# Patient Record
Sex: Male | Born: 1945
Health system: Southern US, Community
[De-identification: ages and names within clinical notes are randomized; demographics above are authoritative.]

## PROBLEM LIST (undated history)

## (undated) DIAGNOSIS — H409 Unspecified glaucoma: Secondary | ICD-10-CM

## (undated) DIAGNOSIS — M1990 Unspecified osteoarthritis, unspecified site: Secondary | ICD-10-CM

## (undated) DIAGNOSIS — M545 Low back pain, unspecified: Secondary | ICD-10-CM

## (undated) DIAGNOSIS — D509 Iron deficiency anemia, unspecified: Secondary | ICD-10-CM

## (undated) DIAGNOSIS — N400 Enlarged prostate without lower urinary tract symptoms: Secondary | ICD-10-CM

## (undated) DIAGNOSIS — D126 Benign neoplasm of colon, unspecified: Secondary | ICD-10-CM

## (undated) DIAGNOSIS — N059 Unspecified nephritic syndrome with unspecified morphologic changes: Secondary | ICD-10-CM

## (undated) DIAGNOSIS — M419 Scoliosis, unspecified: Secondary | ICD-10-CM

## (undated) DIAGNOSIS — K635 Polyp of colon: Secondary | ICD-10-CM

## (undated) DIAGNOSIS — IMO0002 Reserved for concepts with insufficient information to code with codable children: Secondary | ICD-10-CM

## (undated) DIAGNOSIS — K5730 Diverticulosis of large intestine without perforation or abscess without bleeding: Secondary | ICD-10-CM

## (undated) DIAGNOSIS — Z973 Presence of spectacles and contact lenses: Secondary | ICD-10-CM

## (undated) DIAGNOSIS — K2270 Barrett's esophagus without dysplasia: Secondary | ICD-10-CM

## (undated) DIAGNOSIS — K311 Adult hypertrophic pyloric stenosis: Secondary | ICD-10-CM

## (undated) DIAGNOSIS — N200 Calculus of kidney: Secondary | ICD-10-CM

## (undated) DIAGNOSIS — S52501A Unspecified fracture of the lower end of right radius, initial encounter for closed fracture: Secondary | ICD-10-CM

## (undated) DIAGNOSIS — N281 Cyst of kidney, acquired: Secondary | ICD-10-CM

## (undated) DIAGNOSIS — K315 Obstruction of duodenum: Secondary | ICD-10-CM

## (undated) DIAGNOSIS — Z87442 Personal history of urinary calculi: Secondary | ICD-10-CM

## (undated) DIAGNOSIS — I4891 Unspecified atrial fibrillation: Secondary | ICD-10-CM

## (undated) DIAGNOSIS — K219 Gastro-esophageal reflux disease without esophagitis: Secondary | ICD-10-CM

## (undated) DIAGNOSIS — G4730 Sleep apnea, unspecified: Secondary | ICD-10-CM

## (undated) DIAGNOSIS — I4892 Unspecified atrial flutter: Secondary | ICD-10-CM

## (undated) DIAGNOSIS — G8929 Other chronic pain: Secondary | ICD-10-CM

## (undated) DIAGNOSIS — I10 Essential (primary) hypertension: Secondary | ICD-10-CM

## (undated) DIAGNOSIS — E785 Hyperlipidemia, unspecified: Secondary | ICD-10-CM

## (undated) DIAGNOSIS — I498 Other specified cardiac arrhythmias: Secondary | ICD-10-CM

## (undated) HISTORY — DX: Polyp of colon: K63.5

## (undated) HISTORY — DX: Reserved for concepts with insufficient information to code with codable children: IMO0002

## (undated) HISTORY — DX: Low back pain: M54.5

## (undated) HISTORY — DX: Obstruction of duodenum: K31.5

## (undated) HISTORY — PX: SMALL INTESTINE SURGERY: SHX150

## (undated) HISTORY — DX: Unspecified atrial flutter: I48.92

## (undated) HISTORY — DX: Low back pain, unspecified: M54.50

## (undated) HISTORY — DX: Cyst of kidney, acquired: N28.1

## (undated) HISTORY — PX: COLONOSCOPY: SHX174

## (undated) HISTORY — DX: Unspecified osteoarthritis, unspecified site: M19.90

## (undated) HISTORY — DX: Barrett's esophagus without dysplasia: K22.70

## (undated) HISTORY — PX: TONSILLECTOMY: SUR1361

## (undated) HISTORY — DX: Diverticulosis of large intestine without perforation or abscess without bleeding: K57.30

## (undated) HISTORY — DX: Sleep apnea, unspecified: G47.30

## (undated) HISTORY — DX: Iron deficiency anemia, unspecified: D50.9

## (undated) HISTORY — PX: HIATAL HERNIA REPAIR: SHX195

## (undated) HISTORY — DX: Gastro-esophageal reflux disease without esophagitis: K21.9

## (undated) HISTORY — DX: Hyperlipidemia, unspecified: E78.5

## (undated) HISTORY — DX: Calculus of kidney: N20.0

## (undated) HISTORY — DX: Essential (primary) hypertension: I10

## (undated) HISTORY — DX: Adult hypertrophic pyloric stenosis: K31.1

## (undated) HISTORY — PX: APPENDECTOMY: SHX54

## (undated) HISTORY — DX: Other chronic pain: G89.29

## (undated) HISTORY — DX: Other specified cardiac arrhythmias: I49.8

## (undated) HISTORY — DX: Benign prostatic hyperplasia without lower urinary tract symptoms: N40.0

## (undated) HISTORY — DX: Benign neoplasm of colon, unspecified: D12.6

## (undated) HISTORY — DX: Unspecified atrial fibrillation: I48.91

---

## 1999-08-25 ENCOUNTER — Encounter: Payer: Self-pay | Admitting: Internal Medicine

## 1999-08-25 ENCOUNTER — Ambulatory Visit (HOSPITAL_COMMUNITY): Admission: RE | Admit: 1999-08-25 | Discharge: 1999-08-25 | Payer: Self-pay | Admitting: Internal Medicine

## 1999-12-04 ENCOUNTER — Inpatient Hospital Stay (HOSPITAL_COMMUNITY): Admission: EM | Admit: 1999-12-04 | Discharge: 1999-12-07 | Payer: Self-pay | Admitting: *Deleted

## 2000-01-11 DIAGNOSIS — K635 Polyp of colon: Secondary | ICD-10-CM

## 2000-01-11 HISTORY — DX: Polyp of colon: K63.5

## 2000-02-07 ENCOUNTER — Encounter: Payer: Self-pay | Admitting: Gastroenterology

## 2000-02-07 ENCOUNTER — Encounter (INDEPENDENT_AMBULATORY_CARE_PROVIDER_SITE_OTHER): Payer: Self-pay | Admitting: Specialist

## 2000-02-07 ENCOUNTER — Other Ambulatory Visit: Admission: RE | Admit: 2000-02-07 | Discharge: 2000-02-07 | Payer: Self-pay | Admitting: Gastroenterology

## 2000-09-24 ENCOUNTER — Encounter: Payer: Self-pay | Admitting: Endocrinology

## 2000-09-24 ENCOUNTER — Ambulatory Visit (HOSPITAL_COMMUNITY): Admission: RE | Admit: 2000-09-24 | Discharge: 2000-09-24 | Payer: Self-pay | Admitting: Endocrinology

## 2003-01-27 ENCOUNTER — Emergency Department (HOSPITAL_COMMUNITY): Admission: EM | Admit: 2003-01-27 | Discharge: 2003-01-27 | Payer: Self-pay

## 2003-01-29 ENCOUNTER — Emergency Department (HOSPITAL_COMMUNITY): Admission: EM | Admit: 2003-01-29 | Discharge: 2003-01-29 | Payer: Self-pay | Admitting: Emergency Medicine

## 2003-01-29 ENCOUNTER — Encounter: Payer: Self-pay | Admitting: Emergency Medicine

## 2003-02-11 HISTORY — PX: BACK SURGERY: SHX140

## 2003-02-14 ENCOUNTER — Ambulatory Visit (HOSPITAL_COMMUNITY): Admission: RE | Admit: 2003-02-14 | Discharge: 2003-02-14 | Payer: Self-pay | Admitting: Neurosurgery

## 2003-02-14 ENCOUNTER — Encounter: Payer: Self-pay | Admitting: Neurosurgery

## 2003-02-15 ENCOUNTER — Encounter: Payer: Self-pay | Admitting: Neurosurgery

## 2003-02-24 ENCOUNTER — Encounter (INDEPENDENT_AMBULATORY_CARE_PROVIDER_SITE_OTHER): Payer: Self-pay | Admitting: Specialist

## 2003-02-24 ENCOUNTER — Inpatient Hospital Stay (HOSPITAL_COMMUNITY): Admission: RE | Admit: 2003-02-24 | Discharge: 2003-02-26 | Payer: Self-pay | Admitting: Neurosurgery

## 2003-02-24 ENCOUNTER — Encounter: Payer: Self-pay | Admitting: Neurosurgery

## 2004-04-03 ENCOUNTER — Encounter: Admission: RE | Admit: 2004-04-03 | Discharge: 2004-04-03 | Payer: Self-pay | Admitting: Neurosurgery

## 2004-04-18 ENCOUNTER — Encounter: Admission: RE | Admit: 2004-04-18 | Discharge: 2004-04-18 | Payer: Self-pay | Admitting: Neurosurgery

## 2004-05-08 ENCOUNTER — Encounter: Admission: RE | Admit: 2004-05-08 | Discharge: 2004-05-08 | Payer: Self-pay | Admitting: Neurosurgery

## 2004-09-20 ENCOUNTER — Ambulatory Visit: Payer: Self-pay | Admitting: Internal Medicine

## 2004-12-15 ENCOUNTER — Ambulatory Visit: Payer: Self-pay | Admitting: Internal Medicine

## 2005-01-11 ENCOUNTER — Ambulatory Visit: Payer: Self-pay | Admitting: Internal Medicine

## 2005-01-16 ENCOUNTER — Ambulatory Visit (HOSPITAL_COMMUNITY): Admission: RE | Admit: 2005-01-16 | Discharge: 2005-01-16 | Payer: Self-pay | Admitting: Internal Medicine

## 2005-01-23 ENCOUNTER — Ambulatory Visit: Payer: Self-pay | Admitting: Endocrinology

## 2005-01-23 ENCOUNTER — Ambulatory Visit (HOSPITAL_COMMUNITY): Admission: RE | Admit: 2005-01-23 | Discharge: 2005-01-23 | Payer: Self-pay | Admitting: Endocrinology

## 2005-02-12 ENCOUNTER — Ambulatory Visit: Payer: Self-pay | Admitting: Internal Medicine

## 2005-05-14 ENCOUNTER — Ambulatory Visit: Payer: Self-pay | Admitting: Internal Medicine

## 2005-07-06 ENCOUNTER — Ambulatory Visit: Payer: Self-pay | Admitting: Internal Medicine

## 2005-07-23 ENCOUNTER — Encounter: Admission: RE | Admit: 2005-07-23 | Discharge: 2005-07-23 | Payer: Self-pay | Admitting: Internal Medicine

## 2005-07-30 ENCOUNTER — Ambulatory Visit: Payer: Self-pay | Admitting: Internal Medicine

## 2005-08-27 ENCOUNTER — Ambulatory Visit: Payer: Self-pay | Admitting: Internal Medicine

## 2006-01-11 ENCOUNTER — Ambulatory Visit: Payer: Self-pay | Admitting: Internal Medicine

## 2006-01-15 ENCOUNTER — Ambulatory Visit: Payer: Self-pay | Admitting: Internal Medicine

## 2006-03-13 ENCOUNTER — Ambulatory Visit: Payer: Self-pay | Admitting: Internal Medicine

## 2006-04-12 ENCOUNTER — Ambulatory Visit: Payer: Self-pay | Admitting: Internal Medicine

## 2006-04-17 ENCOUNTER — Ambulatory Visit: Payer: Self-pay | Admitting: Internal Medicine

## 2006-05-23 ENCOUNTER — Ambulatory Visit: Payer: Self-pay | Admitting: Internal Medicine

## 2006-07-11 ENCOUNTER — Ambulatory Visit: Payer: Self-pay | Admitting: Internal Medicine

## 2006-07-18 ENCOUNTER — Ambulatory Visit: Payer: Self-pay | Admitting: Internal Medicine

## 2006-09-13 ENCOUNTER — Ambulatory Visit: Payer: Self-pay | Admitting: Internal Medicine

## 2006-09-13 LAB — CONVERTED CEMR LAB
ALT: 18 units/L (ref 0–40)
AST: 21 units/L (ref 0–37)
Albumin: 4 g/dL (ref 3.5–5.2)
Alkaline Phosphatase: 84 units/L (ref 39–117)
BUN: 12 mg/dL (ref 6–23)
Basophils Absolute: 0 10*3/uL (ref 0.0–0.1)
Basophils Relative: 0.6 % (ref 0.0–1.0)
CO2: 23 meq/L (ref 19–32)
Calcium: 10.2 mg/dL (ref 8.4–10.5)
Chloride: 108 meq/L (ref 96–112)
Chol/HDL Ratio, serum: 4.4
Cholesterol: 178 mg/dL (ref 0–200)
Creatinine, Ser: 1.3 mg/dL (ref 0.4–1.5)
Eosinophil percent: 2.6 % (ref 0.0–5.0)
GFR calc non Af Amer: 60 mL/min
Glomerular Filtration Rate, Af Am: 72 mL/min/{1.73_m2}
Glucose, Bld: 107 mg/dL — ABNORMAL HIGH (ref 70–99)
HCT: 33.5 % — ABNORMAL LOW (ref 39.0–52.0)
HDL: 40.4 mg/dL (ref >39.0)
Hemoglobin: 10.7 g/dL — ABNORMAL LOW (ref 13.0–17.0)
LDL DIRECT: 99.6 mg/dL
Lymphocytes Relative: 24.1 % (ref 12.0–46.0)
MCHC: 32 g/dL (ref 30.0–36.0)
MCV: 90.7 fL (ref 78.0–100.0)
Monocytes Absolute: 0.5 10*3/uL (ref 0.2–0.7)
Monocytes Relative: 7.4 % (ref 3.0–11.0)
Neutro Abs: 4.2 10*3/uL (ref 1.4–7.7)
Neutrophils Relative %: 65.3 % (ref 43.0–77.0)
Platelets: 669 10*3/uL — ABNORMAL HIGH (ref 150–400)
Potassium: 4.5 meq/L (ref 3.5–5.1)
RBC: 3.69 M/uL — ABNORMAL LOW (ref 4.22–5.81)
RDW: 13.5 % (ref 11.5–14.6)
Sodium: 141 meq/L (ref 135–145)
TSH: 0.58 microintl units/mL (ref 0.35–5.50)
Total Bilirubin: 0.5 mg/dL (ref 0.3–1.2)
Total Protein: 7.1 g/dL (ref 6.0–8.3)
Triglyceride fasting, serum: 231 mg/dL (ref 0–149)
VLDL: 46 mg/dL — ABNORMAL HIGH (ref 0–40)
Vitamin B-12: 751 pg/mL (ref 211–911)
WBC: 6.4 10*3/uL (ref 4.5–10.5)

## 2006-10-10 ENCOUNTER — Ambulatory Visit: Payer: Self-pay | Admitting: Internal Medicine

## 2006-12-20 ENCOUNTER — Ambulatory Visit: Payer: Self-pay | Admitting: Internal Medicine

## 2006-12-20 LAB — CONVERTED CEMR LAB
BUN: 11 mg/dL (ref 6–23)
Basophils Absolute: 0.1 10*3/uL (ref 0.0–0.1)
Basophils Relative: 0.7 % (ref 0.0–1.0)
CO2: 24 meq/L (ref 19–32)
Calcium: 10.3 mg/dL (ref 8.4–10.5)
Chloride: 107 meq/L (ref 96–112)
Creatinine, Ser: 1.3 mg/dL (ref 0.4–1.5)
Eosinophils Absolute: 0.3 10*3/uL (ref 0.0–0.6)
Eosinophils Relative: 3.7 % (ref 0.0–5.0)
GFR calc Af Amer: 72 mL/min
GFR calc non Af Amer: 60 mL/min
Glucose, Bld: 108 mg/dL — ABNORMAL HIGH (ref 70–99)
HCT: 32.3 % — ABNORMAL LOW (ref 39.0–52.0)
Hemoglobin: 10.5 g/dL — ABNORMAL LOW (ref 13.0–17.0)
Hgb A1c MFr Bld: 5.8 % (ref 4.6–6.0)
Iron: 24 ug/dL — ABNORMAL LOW (ref 42–165)
Lymphocytes Relative: 14.2 % (ref 12.0–46.0)
MCHC: 32.6 g/dL (ref 30.0–36.0)
MCV: 80 fL (ref 78.0–100.0)
Monocytes Absolute: 0.5 10*3/uL (ref 0.2–0.7)
Monocytes Relative: 5.9 % (ref 3.0–11.0)
Neutro Abs: 5.8 10*3/uL (ref 1.4–7.7)
Neutrophils Relative %: 75.5 % (ref 43.0–77.0)
Platelets: 547 10*3/uL — ABNORMAL HIGH (ref 150–400)
Potassium: 5 meq/L (ref 3.5–5.1)
RBC: 4.04 M/uL — ABNORMAL LOW (ref 4.22–5.81)
RDW: 17.3 % — ABNORMAL HIGH (ref 11.5–14.6)
Saturation Ratios: 5.8 % — ABNORMAL LOW (ref 20.0–50.0)
Sodium: 146 meq/L — ABNORMAL HIGH (ref 135–145)
Testosterone: 526.02 ng/dL (ref 350.00–890)
Transferrin: 296.8 mg/dL (ref >=212.0)
WBC: 7.8 10*3/uL (ref 4.5–10.5)

## 2007-02-27 ENCOUNTER — Ambulatory Visit: Payer: Self-pay | Admitting: Gastroenterology

## 2007-03-04 ENCOUNTER — Ambulatory Visit: Payer: Self-pay | Admitting: Internal Medicine

## 2007-03-13 ENCOUNTER — Ambulatory Visit: Payer: Self-pay | Admitting: Gastroenterology

## 2007-03-13 LAB — HM COLONOSCOPY

## 2007-03-24 ENCOUNTER — Ambulatory Visit: Payer: Self-pay | Admitting: Internal Medicine

## 2007-03-24 LAB — CONVERTED CEMR LAB
ALT: 16 units/L (ref 0–40)
AST: 17 units/L (ref 0–37)
Albumin: 3.6 g/dL (ref 3.5–5.2)
Alkaline Phosphatase: 75 units/L (ref 39–117)
Amylase: 63 units/L (ref 27–131)
BUN: 10 mg/dL (ref 6–23)
Basophils Absolute: 0 10*3/uL (ref 0.0–0.1)
Basophils Relative: 0 % (ref 0.0–1.0)
Bilirubin Urine: NEGATIVE
Bilirubin, Direct: 0.1 mg/dL (ref 0.0–0.3)
CO2: 27 meq/L (ref 19–32)
Calcium: 9.8 mg/dL (ref 8.4–10.5)
Chloride: 112 meq/L (ref 96–112)
Cholesterol: 122 mg/dL (ref 0–200)
Creatinine, Ser: 1.2 mg/dL (ref 0.4–1.5)
Eosinophils Absolute: 0.1 10*3/uL (ref 0.0–0.6)
Eosinophils Relative: 0.9 % (ref 0.0–5.0)
GFR calc Af Amer: 79 mL/min
GFR calc non Af Amer: 65 mL/min
Glucose, Bld: 107 mg/dL — ABNORMAL HIGH (ref 70–99)
HCT: 33.2 % — ABNORMAL LOW (ref 39.0–52.0)
HDL: 33.1 mg/dL — ABNORMAL LOW (ref >39.0)
Hemoglobin, Urine: NEGATIVE
Hemoglobin: 10.8 g/dL — ABNORMAL LOW (ref 13.0–17.0)
Ketones, ur: NEGATIVE mg/dL
LDL Cholesterol: 59 mg/dL (ref 0–99)
Leukocytes, UA: NEGATIVE
Lipase: 18 units/L (ref 11.0–59.0)
Lymphocytes Relative: 8.6 % — ABNORMAL LOW (ref 12.0–46.0)
MCHC: 32.6 g/dL (ref 30.0–36.0)
MCV: 83.1 fL (ref 78.0–100.0)
Monocytes Absolute: 0.7 10*3/uL (ref 0.2–0.7)
Monocytes Relative: 4.8 % (ref 3.0–11.0)
Neutro Abs: 11.8 10*3/uL — ABNORMAL HIGH (ref 1.4–7.7)
Neutrophils Relative %: 85.7 % — ABNORMAL HIGH (ref 43.0–77.0)
Nitrite: NEGATIVE
PSA: 0.32 ng/mL (ref 0.10–4.00)
Platelets: 543 10*3/uL — ABNORMAL HIGH (ref 150–400)
Potassium: 4.3 meq/L (ref 3.5–5.1)
RBC: 4 M/uL — ABNORMAL LOW (ref 4.22–5.81)
RDW: 17.5 % — ABNORMAL HIGH (ref 11.5–14.6)
Sodium: 145 meq/L (ref 135–145)
Specific Gravity, Urine: 1.01 (ref 1.000–1.03)
Total Bilirubin: 0.5 mg/dL (ref 0.3–1.2)
Total CHOL/HDL Ratio: 3.7
Total Protein, Urine: NEGATIVE mg/dL
Total Protein: 6.5 g/dL (ref 6.0–8.3)
Triglycerides: 151 mg/dL — ABNORMAL HIGH (ref 0–149)
Urine Glucose: NEGATIVE mg/dL
Urobilinogen, UA: 0.2 (ref 0.0–1.0)
VLDL: 30 mg/dL (ref 0–40)
Vit D, 1,25-Dihydroxy: 41 (ref 20–57)
WBC: 13.8 10*3/uL — ABNORMAL HIGH (ref 4.5–10.5)
pH: 6.5 (ref 5.0–8.0)

## 2007-04-08 ENCOUNTER — Ambulatory Visit: Payer: Self-pay | Admitting: Internal Medicine

## 2007-05-22 ENCOUNTER — Ambulatory Visit (HOSPITAL_BASED_OUTPATIENT_CLINIC_OR_DEPARTMENT_OTHER): Admission: RE | Admit: 2007-05-22 | Discharge: 2007-05-22 | Payer: Self-pay | Admitting: Urology

## 2007-06-09 ENCOUNTER — Ambulatory Visit: Payer: Self-pay | Admitting: Internal Medicine

## 2007-06-09 DIAGNOSIS — M519 Unspecified thoracic, thoracolumbar and lumbosacral intervertebral disc disorder: Secondary | ICD-10-CM | POA: Insufficient documentation

## 2007-06-09 DIAGNOSIS — E785 Hyperlipidemia, unspecified: Secondary | ICD-10-CM | POA: Insufficient documentation

## 2007-06-09 DIAGNOSIS — I119 Hypertensive heart disease without heart failure: Secondary | ICD-10-CM | POA: Insufficient documentation

## 2007-06-09 DIAGNOSIS — D509 Iron deficiency anemia, unspecified: Secondary | ICD-10-CM | POA: Insufficient documentation

## 2007-06-09 DIAGNOSIS — K5730 Diverticulosis of large intestine without perforation or abscess without bleeding: Secondary | ICD-10-CM | POA: Insufficient documentation

## 2007-06-09 LAB — CONVERTED CEMR LAB
Amphetamine Screen, Ur: NEGATIVE
BUN: 9 mg/dL (ref 6–23)
Barbiturate Quant, Ur: NEGATIVE
Benzodiazepines.: POSITIVE — AB
CO2: 27 meq/L (ref 19–32)
Calcium: 9.6 mg/dL (ref 8.4–10.5)
Chloride: 108 meq/L (ref 96–112)
Cholesterol: 214 mg/dL (ref 0–200)
Cocaine Metabolites: NEGATIVE
Creatinine, Ser: 1.2 mg/dL (ref 0.4–1.5)
Creatinine,U: 25 mg/dL
Direct LDL: 134.8 mg/dL
GFR calc Af Amer: 79 mL/min
GFR calc non Af Amer: 65 mL/min
Glucose, Bld: 104 mg/dL — ABNORMAL HIGH (ref 70–99)
HDL: 35.6 mg/dL — ABNORMAL LOW (ref >39.0)
Iron: 41 ug/dL — ABNORMAL LOW (ref 42–165)
Marijuana Metabolite: NEGATIVE
Methadone: NEGATIVE
Opiate Screen, Urine: NEGATIVE
PSA: 0.35 ng/mL (ref 0.10–4.00)
Phencyclidine (PCP): NEGATIVE
Potassium: 5.1 meq/L (ref 3.5–5.1)
Propoxyphene: NEGATIVE
Sodium: 143 meq/L (ref 135–145)
Total CHOL/HDL Ratio: 6
Triglycerides: 188 mg/dL — ABNORMAL HIGH (ref 0–149)
VLDL: 38 mg/dL (ref 0–40)

## 2007-06-10 ENCOUNTER — Ambulatory Visit: Payer: Self-pay | Admitting: Internal Medicine

## 2007-07-10 ENCOUNTER — Ambulatory Visit: Payer: Self-pay | Admitting: Internal Medicine

## 2007-07-11 ENCOUNTER — Ambulatory Visit (HOSPITAL_COMMUNITY): Admission: RE | Admit: 2007-07-11 | Discharge: 2007-07-11 | Payer: Self-pay | Admitting: Internal Medicine

## 2007-09-04 ENCOUNTER — Ambulatory Visit: Payer: Self-pay | Admitting: Internal Medicine

## 2007-09-04 LAB — CONVERTED CEMR LAB
ALT: 15 units/L (ref 0–53)
AST: 18 units/L (ref 0–37)
Albumin: 3.9 g/dL (ref 3.5–5.2)
Alkaline Phosphatase: 82 units/L (ref 39–117)
Amylase: 82 units/L (ref 27–131)
BUN: 13 mg/dL (ref 6–23)
Basophils Absolute: 0 10*3/uL (ref 0.0–0.1)
Basophils Relative: 0.3 % (ref 0.0–1.0)
Bilirubin Urine: NEGATIVE
Bilirubin, Direct: 0.1 mg/dL (ref 0.0–0.3)
CO2: 25 meq/L (ref 19–32)
Calcium: 9.6 mg/dL (ref 8.4–10.5)
Chloride: 104 meq/L (ref 96–112)
Creatinine, Ser: 1.1 mg/dL (ref 0.4–1.5)
Eosinophils Absolute: 0.4 10*3/uL (ref 0.0–0.6)
Eosinophils Relative: 5.6 % — ABNORMAL HIGH (ref 0.0–5.0)
GFR calc Af Amer: 88 mL/min
GFR calc non Af Amer: 72 mL/min
Glucose, Bld: 104 mg/dL — ABNORMAL HIGH (ref 70–99)
HCT: 39.6 % (ref 39.0–52.0)
Hemoglobin, Urine: NEGATIVE
Hemoglobin: 13.6 g/dL (ref 13.0–17.0)
Ketones, ur: NEGATIVE mg/dL
Leukocytes, UA: NEGATIVE
Lipase: 23 units/L (ref 11.0–59.0)
Lymphocytes Relative: 23.6 % (ref 12.0–46.0)
MCHC: 34.4 g/dL (ref 30.0–36.0)
MCV: 84.9 fL (ref 78.0–100.0)
Monocytes Absolute: 0.3 10*3/uL (ref 0.2–0.7)
Monocytes Relative: 4.5 % (ref 3.0–11.0)
Neutro Abs: 4.2 10*3/uL (ref 1.4–7.7)
Neutrophils Relative %: 66 % (ref 43.0–77.0)
Nitrite: NEGATIVE
Platelets: 381 10*3/uL (ref 150–400)
Potassium: 4.7 meq/L (ref 3.5–5.1)
RBC: 4.67 M/uL (ref 4.22–5.81)
RDW: 18 % — ABNORMAL HIGH (ref 11.5–14.6)
Sed Rate: 10 mm/hr (ref 0–20)
Sodium: 137 meq/L (ref 135–145)
Specific Gravity, Urine: 1.01 (ref 1.000–1.03)
Total Bilirubin: 0.6 mg/dL (ref 0.3–1.2)
Total Protein, Urine: NEGATIVE mg/dL
Total Protein: 6.5 g/dL (ref 6.0–8.3)
Urine Glucose: NEGATIVE mg/dL
Urobilinogen, UA: 0.2 (ref 0.0–1.0)
WBC: 6.4 10*3/uL (ref 4.5–10.5)
pH: 6.5 (ref 5.0–8.0)

## 2007-09-11 ENCOUNTER — Encounter: Payer: Self-pay | Admitting: Internal Medicine

## 2007-09-11 ENCOUNTER — Ambulatory Visit: Payer: Self-pay | Admitting: Internal Medicine

## 2007-09-11 DIAGNOSIS — K219 Gastro-esophageal reflux disease without esophagitis: Secondary | ICD-10-CM | POA: Insufficient documentation

## 2007-09-11 DIAGNOSIS — N400 Enlarged prostate without lower urinary tract symptoms: Secondary | ICD-10-CM | POA: Insufficient documentation

## 2007-09-18 ENCOUNTER — Telehealth: Payer: Self-pay | Admitting: Internal Medicine

## 2007-09-29 ENCOUNTER — Telehealth: Payer: Self-pay | Admitting: Internal Medicine

## 2007-10-08 ENCOUNTER — Telehealth: Payer: Self-pay | Admitting: Internal Medicine

## 2007-10-15 ENCOUNTER — Ambulatory Visit: Payer: Self-pay | Admitting: Internal Medicine

## 2007-10-15 LAB — CONVERTED CEMR LAB
ALT: 21 units/L (ref 0–53)
AST: 19 units/L (ref 0–37)
Albumin: 3.9 g/dL (ref 3.5–5.2)
Alkaline Phosphatase: 85 units/L (ref 39–117)
Basophils Absolute: 0 10*3/uL (ref 0.0–0.1)
Basophils Relative: 0.1 % (ref 0.0–1.0)
Bilirubin Urine: NEGATIVE
Bilirubin, Direct: 0.1 mg/dL (ref 0.0–0.3)
Cholesterol: 201 mg/dL (ref 0–200)
Direct LDL: 123.1 mg/dL
Eosinophils Absolute: 0.2 10*3/uL (ref 0.0–0.6)
Eosinophils Relative: 4.6 % (ref 0.0–5.0)
HCT: 41.4 % (ref 39.0–52.0)
HDL: 45.8 mg/dL (ref >39.0)
Hemoglobin, Urine: NEGATIVE
Hemoglobin: 14.3 g/dL (ref 13.0–17.0)
Ketones, ur: NEGATIVE mg/dL
Leukocytes, UA: NEGATIVE
Lymphocytes Relative: 26.2 % (ref 12.0–46.0)
MCHC: 34.4 g/dL (ref 30.0–36.0)
MCV: 89.7 fL (ref 78.0–100.0)
Monocytes Absolute: 0.4 10*3/uL (ref 0.2–0.7)
Monocytes Relative: 6.9 % (ref 3.0–11.0)
Neutro Abs: 3.3 10*3/uL (ref 1.4–7.7)
Neutrophils Relative %: 62.2 % (ref 43.0–77.0)
Nitrite: NEGATIVE
Platelets: 356 10*3/uL (ref 150–400)
RBC: 4.61 M/uL (ref 4.22–5.81)
RDW: 14.6 % (ref 11.5–14.6)
Specific Gravity, Urine: 1.01 (ref 1.000–1.03)
Total Bilirubin: 0.7 mg/dL (ref 0.3–1.2)
Total CHOL/HDL Ratio: 4.4
Total Protein, Urine: NEGATIVE mg/dL
Total Protein: 6.9 g/dL (ref 6.0–8.3)
Triglycerides: 174 mg/dL — ABNORMAL HIGH (ref 0–149)
Urine Glucose: NEGATIVE mg/dL
Urobilinogen, UA: 0.2 (ref 0.0–1.0)
VLDL: 35 mg/dL (ref 0–40)
WBC: 5.3 10*3/uL (ref 4.5–10.5)
pH: 7 (ref 5.0–8.0)

## 2007-10-22 ENCOUNTER — Ambulatory Visit: Payer: Self-pay | Admitting: Internal Medicine

## 2007-10-22 DIAGNOSIS — J309 Allergic rhinitis, unspecified: Secondary | ICD-10-CM | POA: Insufficient documentation

## 2007-10-27 ENCOUNTER — Telehealth: Payer: Self-pay | Admitting: Internal Medicine

## 2007-11-13 HISTORY — PX: LUMBAR FUSION: SHX111

## 2007-12-24 ENCOUNTER — Telehealth: Payer: Self-pay | Admitting: Internal Medicine

## 2008-01-02 ENCOUNTER — Telehealth: Payer: Self-pay | Admitting: Internal Medicine

## 2008-01-16 ENCOUNTER — Ambulatory Visit: Payer: Self-pay | Admitting: Internal Medicine

## 2008-01-16 LAB — CONVERTED CEMR LAB
ALT: 23 units/L (ref 0–53)
AST: 21 units/L (ref 0–37)
Albumin: 3.7 g/dL (ref 3.5–5.2)
Alkaline Phosphatase: 78 units/L (ref 39–117)
BUN: 17 mg/dL (ref 6–23)
Bilirubin, Direct: 0.1 mg/dL (ref 0.0–0.3)
CO2: 31 meq/L (ref 19–32)
Calcium: 9.9 mg/dL (ref 8.4–10.5)
Chloride: 107 meq/L (ref 96–112)
Cholesterol: 165 mg/dL (ref 0–200)
Creatinine, Ser: 1.2 mg/dL (ref 0.4–1.5)
GFR calc Af Amer: 79 mL/min
GFR calc non Af Amer: 65 mL/min
Glucose, Bld: 94 mg/dL (ref 70–99)
HDL: 39.8 mg/dL (ref >39.0)
LDL Cholesterol: 91 mg/dL (ref 0–99)
Potassium: 5.2 meq/L — ABNORMAL HIGH (ref 3.5–5.1)
Sodium: 142 meq/L (ref 135–145)
Total Bilirubin: 0.7 mg/dL (ref 0.3–1.2)
Total CHOL/HDL Ratio: 4.1
Total Protein: 6.2 g/dL (ref 6.0–8.3)
Triglycerides: 173 mg/dL — ABNORMAL HIGH (ref 0–149)
VLDL: 35 mg/dL (ref 0–40)

## 2008-01-22 ENCOUNTER — Ambulatory Visit: Payer: Self-pay | Admitting: Internal Medicine

## 2008-02-17 ENCOUNTER — Ambulatory Visit: Payer: Self-pay | Admitting: Internal Medicine

## 2008-03-22 ENCOUNTER — Telehealth: Payer: Self-pay | Admitting: Internal Medicine

## 2008-04-15 ENCOUNTER — Telehealth (INDEPENDENT_AMBULATORY_CARE_PROVIDER_SITE_OTHER): Payer: Self-pay | Admitting: *Deleted

## 2008-04-19 ENCOUNTER — Ambulatory Visit (HOSPITAL_COMMUNITY): Admission: RE | Admit: 2008-04-19 | Discharge: 2008-04-19 | Payer: Self-pay | Admitting: Neurosurgery

## 2008-04-20 ENCOUNTER — Telehealth: Payer: Self-pay | Admitting: Internal Medicine

## 2008-04-30 ENCOUNTER — Telehealth: Payer: Self-pay | Admitting: Internal Medicine

## 2008-04-30 ENCOUNTER — Ambulatory Visit: Payer: Self-pay | Admitting: Internal Medicine

## 2008-05-04 LAB — CONVERTED CEMR LAB
BUN: 14 mg/dL (ref 6–23)
Basophils Absolute: 0.1 10*3/uL (ref 0.0–0.1)
Basophils Relative: 0.8 % (ref 0.0–1.0)
Bilirubin Urine: NEGATIVE
CO2: 28 meq/L (ref 19–32)
Calcium: 9.5 mg/dL (ref 8.4–10.5)
Chloride: 108 meq/L (ref 96–112)
Creatinine, Ser: 1.1 mg/dL (ref 0.4–1.5)
Crystals: NEGATIVE
Eosinophils Absolute: 0.1 10*3/uL (ref 0.0–0.7)
Eosinophils Relative: 1.4 % (ref 0.0–5.0)
GFR calc Af Amer: 87 mL/min
GFR calc non Af Amer: 72 mL/min
Glucose, Bld: 111 mg/dL — ABNORMAL HIGH (ref 70–99)
HCT: 41.4 % (ref 39.0–52.0)
Hemoglobin: 14.3 g/dL (ref 13.0–17.0)
Hgb A1c MFr Bld: 5.5 % (ref 4.6–6.0)
Ketones, ur: NEGATIVE mg/dL
Lymphocytes Relative: 19.1 % (ref 12.0–46.0)
MCHC: 34.5 g/dL (ref 30.0–36.0)
MCV: 91.6 fL (ref 78.0–100.0)
Monocytes Absolute: 0.5 10*3/uL (ref 0.1–1.0)
Monocytes Relative: 7.9 % (ref 3.0–12.0)
Mucus, UA: NEGATIVE
Neutro Abs: 5.1 10*3/uL (ref 1.4–7.7)
Neutrophils Relative %: 70.8 % (ref 43.0–77.0)
Nitrite: NEGATIVE
Platelets: 304 10*3/uL (ref 150–400)
Potassium: 3.9 meq/L (ref 3.5–5.1)
RBC: 4.52 M/uL (ref 4.22–5.81)
RDW: 12.1 % (ref 11.5–14.6)
Sodium: 143 meq/L (ref 135–145)
Specific Gravity, Urine: 1.01 (ref 1.000–1.03)
TSH: 4.33 microintl units/mL (ref 0.35–5.50)
Total Protein, Urine: NEGATIVE mg/dL
Urine Glucose: NEGATIVE mg/dL
Urobilinogen, UA: 0.2 (ref 0.0–1.0)
WBC: 7.3 10*3/uL (ref 4.5–10.5)
pH: 6 (ref 5.0–8.0)

## 2008-05-05 ENCOUNTER — Ambulatory Visit: Payer: Self-pay | Admitting: Internal Medicine

## 2008-05-05 DIAGNOSIS — N390 Urinary tract infection, site not specified: Secondary | ICD-10-CM | POA: Insufficient documentation

## 2008-05-07 ENCOUNTER — Inpatient Hospital Stay (HOSPITAL_COMMUNITY): Admission: RE | Admit: 2008-05-07 | Discharge: 2008-05-18 | Payer: Self-pay | Admitting: Neurosurgery

## 2008-05-07 ENCOUNTER — Ambulatory Visit: Payer: Self-pay | Admitting: Pulmonary Disease

## 2008-05-07 ENCOUNTER — Ambulatory Visit: Payer: Self-pay | Admitting: Cardiology

## 2008-05-07 ENCOUNTER — Ambulatory Visit: Payer: Self-pay | Admitting: Internal Medicine

## 2008-05-11 ENCOUNTER — Ambulatory Visit: Payer: Self-pay | Admitting: Internal Medicine

## 2008-05-13 ENCOUNTER — Encounter (INDEPENDENT_AMBULATORY_CARE_PROVIDER_SITE_OTHER): Payer: Self-pay | Admitting: Neurosurgery

## 2008-05-17 ENCOUNTER — Encounter: Payer: Self-pay | Admitting: Internal Medicine

## 2008-06-16 ENCOUNTER — Ambulatory Visit: Payer: Self-pay | Admitting: Cardiology

## 2008-06-21 ENCOUNTER — Telehealth: Payer: Self-pay | Admitting: Internal Medicine

## 2008-06-22 ENCOUNTER — Ambulatory Visit: Payer: Self-pay | Admitting: Internal Medicine

## 2008-06-22 LAB — CONVERTED CEMR LAB
Bilirubin Urine: NEGATIVE
Crystals: NEGATIVE
Hemoglobin, Urine: NEGATIVE
Ketones, ur: NEGATIVE mg/dL
Mucus, UA: NEGATIVE
Nitrite: POSITIVE — AB
Specific Gravity, Urine: 1.01 (ref 1.000–1.03)
Total Protein, Urine: NEGATIVE mg/dL
Urine Glucose: NEGATIVE mg/dL
Urobilinogen, UA: 0.2 (ref 0.0–1.0)
pH: 6 (ref 5.0–8.0)

## 2008-07-01 ENCOUNTER — Telehealth: Payer: Self-pay | Admitting: Internal Medicine

## 2008-07-02 ENCOUNTER — Ambulatory Visit: Payer: Self-pay | Admitting: Internal Medicine

## 2008-07-02 LAB — CONVERTED CEMR LAB
Bilirubin Urine: NEGATIVE
Hemoglobin, Urine: NEGATIVE
Leukocytes, UA: NEGATIVE
Nitrite: NEGATIVE
Specific Gravity, Urine: 1.015 (ref 1.000–1.03)
Total Protein, Urine: NEGATIVE mg/dL
Urine Glucose: NEGATIVE mg/dL
Urobilinogen, UA: 0.2 (ref 0.0–1.0)
pH: 6 (ref 5.0–8.0)

## 2008-07-21 ENCOUNTER — Telehealth: Payer: Self-pay | Admitting: Internal Medicine

## 2008-08-02 ENCOUNTER — Ambulatory Visit: Payer: Self-pay | Admitting: Internal Medicine

## 2008-08-02 LAB — CONVERTED CEMR LAB
ALT: 18 units/L (ref 0–53)
AST: 20 units/L (ref 0–37)
Albumin: 3.7 g/dL (ref 3.5–5.2)
Alkaline Phosphatase: 102 units/L (ref 39–117)
BUN: 14 mg/dL (ref 6–23)
Basophils Absolute: 0.1 10*3/uL (ref 0.0–0.1)
Basophils Relative: 0.9 % (ref 0.0–3.0)
Bilirubin, Direct: 0.1 mg/dL (ref 0.0–0.3)
CO2: 28 meq/L (ref 19–32)
Calcium: 9.6 mg/dL (ref 8.4–10.5)
Chloride: 115 meq/L — ABNORMAL HIGH (ref 96–112)
Cholesterol: 127 mg/dL (ref 0–200)
Creatinine, Ser: 1.1 mg/dL (ref 0.4–1.5)
Eosinophils Absolute: 0.4 10*3/uL (ref 0.0–0.7)
Eosinophils Relative: 5.8 % — ABNORMAL HIGH (ref 0.0–5.0)
GFR calc Af Amer: 87 mL/min
GFR calc non Af Amer: 72 mL/min
Glucose, Bld: 104 mg/dL — ABNORMAL HIGH (ref 70–99)
HCT: 36.4 % — ABNORMAL LOW (ref 39.0–52.0)
HDL: 37.8 mg/dL — ABNORMAL LOW (ref >39.0)
Hemoglobin: 12.2 g/dL — ABNORMAL LOW (ref 13.0–17.0)
Iron: 31 ug/dL — ABNORMAL LOW (ref 42–165)
LDL Cholesterol: 59 mg/dL (ref 0–99)
Lymphocytes Relative: 25.5 % (ref 12.0–46.0)
MCHC: 33.5 g/dL (ref 30.0–36.0)
MCV: 91.6 fL (ref 78.0–100.0)
Monocytes Absolute: 0.5 10*3/uL (ref 0.1–1.0)
Monocytes Relative: 7.9 % (ref 3.0–12.0)
Neutro Abs: 3.5 10*3/uL (ref 1.4–7.7)
Neutrophils Relative %: 59.9 % (ref 43.0–77.0)
Platelets: 369 10*3/uL (ref 150–400)
Potassium: 4.5 meq/L (ref 3.5–5.1)
RBC: 3.97 M/uL — ABNORMAL LOW (ref 4.22–5.81)
RDW: 14.2 % (ref 11.5–14.6)
Saturation Ratios: 8.8 % — ABNORMAL LOW (ref 20.0–50.0)
Sodium: 144 meq/L (ref 135–145)
Total Bilirubin: 0.5 mg/dL (ref 0.3–1.2)
Total CHOL/HDL Ratio: 3.4
Total Protein: 6.5 g/dL (ref 6.0–8.3)
Transferrin: 251.3 mg/dL (ref >=212.0)
Triglycerides: 151 mg/dL — ABNORMAL HIGH (ref 0–149)
VLDL: 30 mg/dL (ref 0–40)
WBC: 6.1 10*3/uL (ref 4.5–10.5)

## 2008-08-05 ENCOUNTER — Ambulatory Visit: Payer: Self-pay | Admitting: Internal Medicine

## 2008-08-12 ENCOUNTER — Ambulatory Visit: Payer: Self-pay | Admitting: Internal Medicine

## 2008-08-12 ENCOUNTER — Ambulatory Visit: Payer: Self-pay | Admitting: Gastroenterology

## 2008-08-12 ENCOUNTER — Inpatient Hospital Stay (HOSPITAL_COMMUNITY): Admission: AD | Admit: 2008-08-12 | Discharge: 2008-08-20 | Payer: Self-pay | Admitting: Internal Medicine

## 2008-08-12 DIAGNOSIS — Z8719 Personal history of other diseases of the digestive system: Secondary | ICD-10-CM | POA: Insufficient documentation

## 2008-08-12 DIAGNOSIS — K5289 Other specified noninfective gastroenteritis and colitis: Secondary | ICD-10-CM | POA: Insufficient documentation

## 2008-08-12 DIAGNOSIS — K922 Gastrointestinal hemorrhage, unspecified: Secondary | ICD-10-CM | POA: Insufficient documentation

## 2008-08-13 ENCOUNTER — Encounter: Payer: Self-pay | Admitting: Gastroenterology

## 2008-08-13 ENCOUNTER — Encounter (INDEPENDENT_AMBULATORY_CARE_PROVIDER_SITE_OTHER): Payer: Self-pay | Admitting: *Deleted

## 2008-08-16 ENCOUNTER — Ambulatory Visit: Payer: Self-pay | Admitting: Gastroenterology

## 2008-08-20 ENCOUNTER — Encounter (INDEPENDENT_AMBULATORY_CARE_PROVIDER_SITE_OTHER): Payer: Self-pay | Admitting: *Deleted

## 2008-08-26 ENCOUNTER — Ambulatory Visit: Payer: Self-pay | Admitting: Internal Medicine

## 2008-08-26 DIAGNOSIS — R5383 Other fatigue: Secondary | ICD-10-CM | POA: Insufficient documentation

## 2008-08-30 ENCOUNTER — Ambulatory Visit: Payer: Self-pay | Admitting: Gastroenterology

## 2008-08-30 DIAGNOSIS — K250 Acute gastric ulcer with hemorrhage: Secondary | ICD-10-CM | POA: Insufficient documentation

## 2008-08-30 DIAGNOSIS — D62 Acute posthemorrhagic anemia: Secondary | ICD-10-CM | POA: Insufficient documentation

## 2008-08-30 DIAGNOSIS — K259 Gastric ulcer, unspecified as acute or chronic, without hemorrhage or perforation: Secondary | ICD-10-CM | POA: Insufficient documentation

## 2008-08-30 LAB — CONVERTED CEMR LAB
Basophils Absolute: 0.1 10*3/uL (ref 0.0–0.1)
Basophils Relative: 1.2 % (ref 0.0–3.0)
Eosinophils Absolute: 0.2 10*3/uL (ref 0.0–0.7)
Eosinophils Relative: 3.2 % (ref 0.0–5.0)
HCT: 36.1 % — ABNORMAL LOW (ref 39.0–52.0)
Hemoglobin: 12.4 g/dL — ABNORMAL LOW (ref 13.0–17.0)
Lymphocytes Relative: 27.4 % (ref 12.0–46.0)
MCHC: 34.4 g/dL (ref 30.0–36.0)
MCV: 89 fL (ref 78.0–100.0)
Monocytes Absolute: 0.6 10*3/uL (ref 0.1–1.0)
Monocytes Relative: 11.2 % (ref 3.0–12.0)
Neutro Abs: 2.7 10*3/uL (ref 1.4–7.7)
Neutrophils Relative %: 57 % (ref 43.0–77.0)
Platelets: 373 10*3/uL (ref 150–400)
RBC: 4.06 M/uL — ABNORMAL LOW (ref 4.22–5.81)
RDW: 12.8 % (ref 11.5–14.6)
WBC: 5 10*3/uL (ref 4.5–10.5)

## 2008-09-16 ENCOUNTER — Ambulatory Visit: Payer: Self-pay | Admitting: Internal Medicine

## 2008-09-20 LAB — CONVERTED CEMR LAB
ALT: 13 units/L (ref 0–53)
AST: 17 units/L (ref 0–37)
Albumin: 3.6 g/dL (ref 3.5–5.2)
Alkaline Phosphatase: 86 units/L (ref 39–117)
BUN: 12 mg/dL (ref 6–23)
Basophils Absolute: 0 10*3/uL (ref 0.0–0.1)
Basophils Relative: 0.5 % (ref 0.0–3.0)
Bilirubin, Direct: 0.1 mg/dL (ref 0.0–0.3)
CO2: 29 meq/L (ref 19–32)
Calcium: 9.7 mg/dL (ref 8.4–10.5)
Chloride: 106 meq/L (ref 96–112)
Creatinine, Ser: 1.2 mg/dL (ref 0.4–1.5)
Eosinophils Absolute: 0.2 10*3/uL (ref 0.0–0.7)
Eosinophils Relative: 5.2 % — ABNORMAL HIGH (ref 0.0–5.0)
GFR calc Af Amer: 79 mL/min
GFR calc non Af Amer: 65 mL/min
Glucose, Bld: 99 mg/dL (ref 70–99)
HCT: 36 % — ABNORMAL LOW (ref 39.0–52.0)
Hemoglobin: 12.2 g/dL — ABNORMAL LOW (ref 13.0–17.0)
Iron: 30 ug/dL — ABNORMAL LOW (ref 42–165)
Lymphocytes Relative: 33.5 % (ref 12.0–46.0)
MCHC: 33.9 g/dL (ref 30.0–36.0)
MCV: 88.4 fL (ref 78.0–100.0)
Monocytes Absolute: 0.4 10*3/uL (ref 0.1–1.0)
Monocytes Relative: 9.4 % (ref 3.0–12.0)
Neutro Abs: 2 10*3/uL (ref 1.4–7.7)
Neutrophils Relative %: 51.4 % (ref 43.0–77.0)
Platelets: 330 10*3/uL (ref 150–400)
Potassium: 5.1 meq/L (ref 3.5–5.1)
RBC: 4.08 M/uL — ABNORMAL LOW (ref 4.22–5.81)
RDW: 13.2 % (ref 11.5–14.6)
Saturation Ratios: 8.1 % — ABNORMAL LOW (ref 20.0–50.0)
Sodium: 141 meq/L (ref 135–145)
TSH: 0.73 microintl units/mL (ref 0.35–5.50)
Total Bilirubin: 0.6 mg/dL (ref 0.3–1.2)
Total Protein: 6.1 g/dL (ref 6.0–8.3)
Transferrin: 265 mg/dL (ref >=212.0)
Vitamin B-12: 495 pg/mL (ref 211–911)
WBC: 3.9 10*3/uL — ABNORMAL LOW (ref 4.5–10.5)

## 2008-09-22 ENCOUNTER — Ambulatory Visit: Payer: Self-pay | Admitting: Internal Medicine

## 2008-10-11 ENCOUNTER — Ambulatory Visit: Payer: Self-pay | Admitting: Gastroenterology

## 2008-10-14 ENCOUNTER — Telehealth: Payer: Self-pay | Admitting: Gastroenterology

## 2008-10-19 ENCOUNTER — Telehealth: Payer: Self-pay | Admitting: Gastroenterology

## 2008-11-10 ENCOUNTER — Ambulatory Visit: Payer: Self-pay | Admitting: Internal Medicine

## 2008-11-10 ENCOUNTER — Ambulatory Visit: Payer: Self-pay | Admitting: Gastroenterology

## 2008-11-11 LAB — CONVERTED CEMR LAB
BUN: 14 mg/dL (ref 6–23)
Basophils Absolute: 0 10*3/uL (ref 0.0–0.1)
Basophils Relative: 0.4 % (ref 0.0–3.0)
CO2: 29 meq/L (ref 19–32)
Calcium: 9.9 mg/dL (ref 8.4–10.5)
Chloride: 109 meq/L (ref 96–112)
Creatinine, Ser: 1.1 mg/dL (ref 0.4–1.5)
Eosinophils Absolute: 0.3 10*3/uL (ref 0.0–0.7)
Eosinophils Relative: 3.7 % (ref 0.0–5.0)
GFR calc Af Amer: 87 mL/min
GFR calc non Af Amer: 72 mL/min
Glucose, Bld: 105 mg/dL — ABNORMAL HIGH (ref 70–99)
HCT: 41.2 % (ref 39.0–52.0)
Hemoglobin: 14 g/dL (ref 13.0–17.0)
Lymphocytes Relative: 20.5 % (ref 12.0–46.0)
MCHC: 34 g/dL (ref 30.0–36.0)
MCV: 88.1 fL (ref 78.0–100.0)
Monocytes Absolute: 0.6 10*3/uL (ref 0.1–1.0)
Monocytes Relative: 7.9 % (ref 3.0–12.0)
Neutro Abs: 5.1 10*3/uL (ref 1.4–7.7)
Neutrophils Relative %: 67.5 % (ref 43.0–77.0)
Platelets: 285 10*3/uL (ref 150–400)
Potassium: 5 meq/L (ref 3.5–5.1)
RBC: 4.68 M/uL (ref 4.22–5.81)
RDW: 14.7 % — ABNORMAL HIGH (ref 11.5–14.6)
Sodium: 142 meq/L (ref 135–145)
WBC: 7.5 10*3/uL (ref 4.5–10.5)

## 2008-11-19 ENCOUNTER — Ambulatory Visit: Payer: Self-pay | Admitting: Gastroenterology

## 2008-11-19 ENCOUNTER — Encounter: Payer: Self-pay | Admitting: Gastroenterology

## 2008-11-19 ENCOUNTER — Ambulatory Visit: Payer: Self-pay | Admitting: Internal Medicine

## 2008-11-24 ENCOUNTER — Encounter: Payer: Self-pay | Admitting: Gastroenterology

## 2008-12-06 ENCOUNTER — Telehealth: Payer: Self-pay | Admitting: Gastroenterology

## 2008-12-22 ENCOUNTER — Encounter: Payer: Self-pay | Admitting: Internal Medicine

## 2008-12-27 ENCOUNTER — Telehealth: Payer: Self-pay | Admitting: Gastroenterology

## 2008-12-27 ENCOUNTER — Ambulatory Visit: Payer: Self-pay | Admitting: Cardiology

## 2009-01-03 ENCOUNTER — Telehealth: Payer: Self-pay | Admitting: Internal Medicine

## 2009-01-14 ENCOUNTER — Ambulatory Visit: Payer: Self-pay | Admitting: Internal Medicine

## 2009-01-14 ENCOUNTER — Ambulatory Visit: Payer: Self-pay | Admitting: Gastroenterology

## 2009-01-14 DIAGNOSIS — K56609 Unspecified intestinal obstruction, unspecified as to partial versus complete obstruction: Secondary | ICD-10-CM | POA: Insufficient documentation

## 2009-01-14 DIAGNOSIS — K2270 Barrett's esophagus without dysplasia: Secondary | ICD-10-CM | POA: Insufficient documentation

## 2009-01-14 LAB — CONVERTED CEMR LAB
BUN: 10 mg/dL (ref 6–23)
Basophils Absolute: 0 10*3/uL (ref 0.0–0.1)
Basophils Relative: 0.3 % (ref 0.0–3.0)
CO2: 29 meq/L (ref 19–32)
Calcium: 10.1 mg/dL (ref 8.4–10.5)
Chloride: 106 meq/L (ref 96–112)
Creatinine, Ser: 1.1 mg/dL (ref 0.4–1.5)
Eosinophils Absolute: 0.4 10*3/uL (ref 0.0–0.7)
Eosinophils Relative: 5.6 % — ABNORMAL HIGH (ref 0.0–5.0)
GFR calc Af Amer: 87 mL/min
GFR calc non Af Amer: 72 mL/min
Glucose, Bld: 116 mg/dL — ABNORMAL HIGH (ref 70–99)
HCT: 42.8 % (ref 39.0–52.0)
Hemoglobin: 14.7 g/dL (ref 13.0–17.0)
Iron: 50 ug/dL (ref 42–165)
Lymphocytes Relative: 24.8 % (ref 12.0–46.0)
MCHC: 34.3 g/dL (ref 30.0–36.0)
MCV: 89.8 fL (ref 78.0–100.0)
Monocytes Absolute: 0.4 10*3/uL (ref 0.1–1.0)
Monocytes Relative: 5.4 % (ref 3.0–12.0)
Neutro Abs: 4.2 10*3/uL (ref 1.4–7.7)
Neutrophils Relative %: 63.9 % (ref 43.0–77.0)
Platelets: 308 10*3/uL (ref 150–400)
Potassium: 4.5 meq/L (ref 3.5–5.1)
RBC: 4.76 M/uL (ref 4.22–5.81)
RDW: 13.5 % (ref 11.5–14.6)
Saturation Ratios: 13 % — ABNORMAL LOW (ref 20.0–50.0)
Sodium: 143 meq/L (ref 135–145)
TSH: 0.63 microintl units/mL (ref 0.35–5.50)
Transferrin: 275.5 mg/dL (ref 212.0–360.0)
WBC: 6.7 10*3/uL (ref 4.5–10.5)

## 2009-01-18 ENCOUNTER — Ambulatory Visit: Payer: Self-pay | Admitting: Internal Medicine

## 2009-01-19 ENCOUNTER — Telehealth: Payer: Self-pay | Admitting: Gastroenterology

## 2009-01-24 ENCOUNTER — Ambulatory Visit (HOSPITAL_COMMUNITY): Admission: RE | Admit: 2009-01-24 | Discharge: 2009-01-24 | Payer: Self-pay | Admitting: Gastroenterology

## 2009-01-24 ENCOUNTER — Ambulatory Visit: Payer: Self-pay | Admitting: Gastroenterology

## 2009-03-01 ENCOUNTER — Ambulatory Visit: Payer: Self-pay | Admitting: Gastroenterology

## 2009-03-01 DIAGNOSIS — K269 Duodenal ulcer, unspecified as acute or chronic, without hemorrhage or perforation: Secondary | ICD-10-CM | POA: Insufficient documentation

## 2009-03-17 ENCOUNTER — Telehealth (INDEPENDENT_AMBULATORY_CARE_PROVIDER_SITE_OTHER): Payer: Self-pay | Admitting: *Deleted

## 2009-03-22 ENCOUNTER — Ambulatory Visit: Payer: Self-pay | Admitting: Internal Medicine

## 2009-03-23 ENCOUNTER — Telehealth: Payer: Self-pay | Admitting: Gastroenterology

## 2009-03-24 LAB — CONVERTED CEMR LAB
BUN: 14 mg/dL (ref 6–23)
Basophils Absolute: 0 10*3/uL (ref 0.0–0.1)
Basophils Relative: 0.3 % (ref 0.0–3.0)
Bilirubin Urine: NEGATIVE
CO2: 30 meq/L (ref 19–32)
Calcium: 9.7 mg/dL (ref 8.4–10.5)
Chloride: 105 meq/L (ref 96–112)
Creatinine, Ser: 1.2 mg/dL (ref 0.4–1.5)
Eosinophils Absolute: 0.2 10*3/uL (ref 0.0–0.7)
Eosinophils Relative: 2.8 % (ref 0.0–5.0)
GFR calc non Af Amer: 64.88 mL/min (ref >60)
Glucose, Bld: 111 mg/dL — ABNORMAL HIGH (ref 70–99)
HCT: 42.9 % (ref 39.0–52.0)
Hemoglobin, Urine: NEGATIVE
Hemoglobin: 14.5 g/dL (ref 13.0–17.0)
Iron: 51 ug/dL (ref 42–165)
Ketones, ur: NEGATIVE mg/dL
Leukocytes, UA: NEGATIVE
Lipase: 11 units/L (ref 11.0–59.0)
Lymphocytes Relative: 28.8 % (ref 12.0–46.0)
Lymphs Abs: 2 10*3/uL (ref 0.7–4.0)
MCHC: 33.8 g/dL (ref 30.0–36.0)
MCV: 92.1 fL (ref 78.0–100.0)
Monocytes Absolute: 0.2 10*3/uL (ref 0.1–1.0)
Monocytes Relative: 3.5 % (ref 3.0–12.0)
Neutro Abs: 4.4 10*3/uL (ref 1.4–7.7)
Neutrophils Relative %: 64.6 % (ref 43.0–77.0)
Nitrite: NEGATIVE
Platelets: 321 10*3/uL (ref 150.0–400.0)
Potassium: 4.7 meq/L (ref 3.5–5.1)
RBC: 4.65 M/uL (ref 4.22–5.81)
RDW: 12.6 % (ref 11.5–14.6)
Saturation Ratios: 14.3 % — ABNORMAL LOW (ref 20.0–50.0)
Sodium: 142 meq/L (ref 135–145)
Specific Gravity, Urine: 1.015 (ref 1.000–1.030)
TSH: 0.73 microintl units/mL (ref 0.35–5.50)
Total Protein, Urine: NEGATIVE mg/dL
Transferrin: 255.3 mg/dL (ref 212.0–360.0)
Urine Glucose: NEGATIVE mg/dL
Urobilinogen, UA: 0.2 (ref 0.0–1.0)
Vitamin B-12: 730 pg/mL (ref 211–911)
WBC: 6.8 10*3/uL (ref 4.5–10.5)
pH: 5.5 (ref 5.0–8.0)

## 2009-04-19 ENCOUNTER — Telehealth: Payer: Self-pay | Admitting: Internal Medicine

## 2009-05-10 ENCOUNTER — Telehealth: Payer: Self-pay | Admitting: Internal Medicine

## 2009-05-23 ENCOUNTER — Ambulatory Visit: Payer: Self-pay | Admitting: Internal Medicine

## 2009-05-24 LAB — CONVERTED CEMR LAB
BUN: 13 mg/dL (ref 6–23)
Basophils Absolute: 0.2 10*3/uL — ABNORMAL HIGH (ref 0.0–0.1)
Basophils Relative: 2.6 % (ref 0.0–3.0)
CO2: 31 meq/L (ref 19–32)
Calcium: 10 mg/dL (ref 8.4–10.5)
Chloride: 106 meq/L (ref 96–112)
Creatinine, Ser: 1.2 mg/dL (ref 0.4–1.5)
Eosinophils Absolute: 0.1 10*3/uL (ref 0.0–0.7)
Eosinophils Relative: 1.5 % (ref 0.0–5.0)
GFR calc non Af Amer: 64.85 mL/min (ref >60)
Glucose, Bld: 101 mg/dL — ABNORMAL HIGH (ref 70–99)
HCT: 42.1 % (ref 39.0–52.0)
Hemoglobin: 15 g/dL (ref 13.0–17.0)
Iron: 70 ug/dL (ref 42–165)
Lymphocytes Relative: 22.1 % (ref 12.0–46.0)
Lymphs Abs: 1.4 10*3/uL (ref 0.7–4.0)
MCHC: 35.6 g/dL (ref 30.0–36.0)
MCV: 89.4 fL (ref 78.0–100.0)
Monocytes Absolute: 0.3 10*3/uL (ref 0.1–1.0)
Monocytes Relative: 4.6 % (ref 3.0–12.0)
Neutro Abs: 4.2 10*3/uL (ref 1.4–7.7)
Neutrophils Relative %: 69.2 % (ref 43.0–77.0)
Platelets: 283 10*3/uL (ref 150.0–400.0)
Potassium: 4.9 meq/L (ref 3.5–5.1)
RBC: 4.71 M/uL (ref 4.22–5.81)
RDW: 12.3 % (ref 11.5–14.6)
Saturation Ratios: 18.1 % — ABNORMAL LOW (ref 20.0–50.0)
Sodium: 145 meq/L (ref 135–145)
Transferrin: 276 mg/dL (ref 212.0–360.0)
Vitamin B-12: 619 pg/mL (ref 211–911)
WBC: 6.2 10*3/uL (ref 4.5–10.5)

## 2009-06-28 ENCOUNTER — Telehealth: Payer: Self-pay | Admitting: Gastroenterology

## 2009-06-29 ENCOUNTER — Telehealth: Payer: Self-pay | Admitting: Internal Medicine

## 2009-06-29 ENCOUNTER — Emergency Department (HOSPITAL_COMMUNITY): Admission: EM | Admit: 2009-06-29 | Discharge: 2009-06-29 | Payer: Self-pay | Admitting: Emergency Medicine

## 2009-07-22 ENCOUNTER — Telehealth: Payer: Self-pay | Admitting: Internal Medicine

## 2009-07-25 ENCOUNTER — Ambulatory Visit: Payer: Self-pay | Admitting: Internal Medicine

## 2009-07-26 LAB — CONVERTED CEMR LAB
BUN: 14 mg/dL (ref 6–23)
Basophils Absolute: 0 10*3/uL (ref 0.0–0.1)
Basophils Relative: 0.5 % (ref 0.0–3.0)
CO2: 28 meq/L (ref 19–32)
Calcium: 9.8 mg/dL (ref 8.4–10.5)
Chloride: 107 meq/L (ref 96–112)
Creatinine, Ser: 1.2 mg/dL (ref 0.4–1.5)
Eosinophils Absolute: 0.2 10*3/uL (ref 0.0–0.7)
Eosinophils Relative: 3.7 % (ref 0.0–5.0)
GFR calc non Af Amer: 64.81 mL/min (ref >60)
Glucose, Bld: 99 mg/dL (ref 70–99)
HCT: 39.7 % (ref 39.0–52.0)
Hemoglobin: 13.6 g/dL (ref 13.0–17.0)
Iron: 64 ug/dL (ref 42–165)
Lymphocytes Relative: 23.1 % (ref 12.0–46.0)
Lymphs Abs: 1.2 10*3/uL (ref 0.7–4.0)
MCHC: 34.4 g/dL (ref 30.0–36.0)
MCV: 92.7 fL (ref 78.0–100.0)
Monocytes Absolute: 0.4 10*3/uL (ref 0.1–1.0)
Monocytes Relative: 7.2 % (ref 3.0–12.0)
Neutro Abs: 3.5 10*3/uL (ref 1.4–7.7)
Neutrophils Relative %: 65.5 % (ref 43.0–77.0)
Platelets: 276 10*3/uL (ref 150.0–400.0)
Potassium: 4.8 meq/L (ref 3.5–5.1)
RBC: 4.28 M/uL (ref 4.22–5.81)
RDW: 12.4 % (ref 11.5–14.6)
Saturation Ratios: 18.8 % — ABNORMAL LOW (ref 20.0–50.0)
Sodium: 142 meq/L (ref 135–145)
Transferrin: 242.8 mg/dL (ref 212.0–360.0)
WBC: 5.3 10*3/uL (ref 4.5–10.5)

## 2009-09-26 ENCOUNTER — Ambulatory Visit: Payer: Self-pay | Admitting: Internal Medicine

## 2009-11-12 HISTORY — PX: BILROTH I PROCEDURE: SHX1231

## 2009-12-21 ENCOUNTER — Telehealth: Payer: Self-pay | Admitting: Internal Medicine

## 2010-01-18 ENCOUNTER — Telehealth: Payer: Self-pay | Admitting: Internal Medicine

## 2010-01-31 ENCOUNTER — Encounter: Payer: Self-pay | Admitting: Internal Medicine

## 2010-02-01 ENCOUNTER — Ambulatory Visit: Payer: Self-pay | Admitting: Internal Medicine

## 2010-02-01 LAB — CONVERTED CEMR LAB
ALT: 19 units/L (ref 0–53)
AST: 22 units/L (ref 0–37)
Albumin: 3.8 g/dL (ref 3.5–5.2)
Alkaline Phosphatase: 76 units/L (ref 39–117)
BUN: 14 mg/dL (ref 6–23)
Basophils Absolute: 0 10*3/uL (ref 0.0–0.1)
Basophils Relative: 0.5 % (ref 0.0–3.0)
Bilirubin Urine: NEGATIVE
Bilirubin, Direct: 0.1 mg/dL (ref 0.0–0.3)
CO2: 28 meq/L (ref 19–32)
Calcium: 10 mg/dL (ref 8.4–10.5)
Chloride: 109 meq/L (ref 96–112)
Cholesterol: 227 mg/dL — ABNORMAL HIGH (ref 0–200)
Creatinine, Ser: 1.3 mg/dL (ref 0.4–1.5)
Direct LDL: 161.2 mg/dL
Eosinophils Absolute: 0.2 10*3/uL (ref 0.0–0.7)
Eosinophils Relative: 3.3 % (ref 0.0–5.0)
GFR calc non Af Amer: 59 mL/min (ref >60)
Glucose, Bld: 100 mg/dL — ABNORMAL HIGH (ref 70–99)
HCT: 41.8 % (ref 39.0–52.0)
HDL: 44.9 mg/dL (ref >39.00)
Hemoglobin, Urine: NEGATIVE
Hemoglobin: 13.9 g/dL (ref 13.0–17.0)
Ketones, ur: NEGATIVE mg/dL
Leukocytes, UA: NEGATIVE
Lymphocytes Relative: 29.9 % (ref 12.0–46.0)
Lymphs Abs: 1.5 10*3/uL (ref 0.7–4.0)
MCHC: 33.3 g/dL (ref 30.0–36.0)
MCV: 94.3 fL (ref 78.0–100.0)
Monocytes Absolute: 0.4 10*3/uL (ref 0.1–1.0)
Monocytes Relative: 8.2 % (ref 3.0–12.0)
Neutro Abs: 2.8 10*3/uL (ref 1.4–7.7)
Neutrophils Relative %: 58.1 % (ref 43.0–77.0)
Nitrite: NEGATIVE
PSA: 0.26 ng/mL (ref 0.10–4.00)
Platelets: 275 10*3/uL (ref 150.0–400.0)
Potassium: 4.3 meq/L (ref 3.5–5.1)
RBC: 4.43 M/uL (ref 4.22–5.81)
RDW: 12.4 % (ref 11.5–14.6)
Sodium: 142 meq/L (ref 135–145)
Specific Gravity, Urine: 1.01 (ref 1.000–1.030)
TSH: 0.98 microintl units/mL (ref 0.35–5.50)
Total Bilirubin: 0.5 mg/dL (ref 0.3–1.2)
Total CHOL/HDL Ratio: 5
Total Protein, Urine: NEGATIVE mg/dL
Total Protein: 6.6 g/dL (ref 6.0–8.3)
Triglycerides: 150 mg/dL — ABNORMAL HIGH (ref 0.0–149.0)
Urine Glucose: NEGATIVE mg/dL
Urobilinogen, UA: 0.2 (ref 0.0–1.0)
VLDL: 30 mg/dL (ref 0.0–40.0)
WBC: 4.9 10*3/uL (ref 4.5–10.5)
pH: 6 (ref 5.0–8.0)

## 2010-02-07 ENCOUNTER — Ambulatory Visit: Payer: Self-pay | Admitting: Internal Medicine

## 2010-03-10 ENCOUNTER — Encounter (INDEPENDENT_AMBULATORY_CARE_PROVIDER_SITE_OTHER): Payer: Self-pay | Admitting: *Deleted

## 2010-04-09 ENCOUNTER — Inpatient Hospital Stay (HOSPITAL_COMMUNITY): Admission: EM | Admit: 2010-04-09 | Discharge: 2010-04-11 | Payer: Self-pay | Admitting: Emergency Medicine

## 2010-04-11 ENCOUNTER — Telehealth: Payer: Self-pay | Admitting: Internal Medicine

## 2010-04-13 ENCOUNTER — Encounter: Payer: Self-pay | Admitting: Internal Medicine

## 2010-04-15 ENCOUNTER — Ambulatory Visit: Payer: Self-pay | Admitting: Gastroenterology

## 2010-04-15 ENCOUNTER — Inpatient Hospital Stay (HOSPITAL_COMMUNITY): Admission: EM | Admit: 2010-04-15 | Discharge: 2010-04-19 | Payer: Self-pay | Admitting: Emergency Medicine

## 2010-04-16 ENCOUNTER — Encounter: Payer: Self-pay | Admitting: Gastroenterology

## 2010-04-19 ENCOUNTER — Telehealth: Payer: Self-pay | Admitting: Gastroenterology

## 2010-04-20 ENCOUNTER — Telehealth: Payer: Self-pay | Admitting: Gastroenterology

## 2010-04-21 ENCOUNTER — Telehealth: Payer: Self-pay | Admitting: Gastroenterology

## 2010-04-26 ENCOUNTER — Ambulatory Visit: Payer: Self-pay | Admitting: Internal Medicine

## 2010-04-28 ENCOUNTER — Ambulatory Visit: Payer: Self-pay | Admitting: Gastroenterology

## 2010-04-30 LAB — CONVERTED CEMR LAB
Basophils Absolute: 0 10*3/uL (ref 0.0–0.1)
Basophils Relative: 0.8 % (ref 0.0–3.0)
Eosinophils Absolute: 0.1 10*3/uL (ref 0.0–0.7)
Eosinophils Relative: 3.1 % (ref 0.0–5.0)
HCT: 31.1 % — ABNORMAL LOW (ref 39.0–52.0)
Hemoglobin: 10.4 g/dL — ABNORMAL LOW (ref 13.0–17.0)
Lymphocytes Relative: 23.2 % (ref 12.0–46.0)
Lymphs Abs: 0.9 10*3/uL (ref 0.7–4.0)
MCHC: 33.4 g/dL (ref 30.0–36.0)
MCV: 94.1 fL (ref 78.0–100.0)
Monocytes Absolute: 0.3 10*3/uL (ref 0.1–1.0)
Monocytes Relative: 7.8 % (ref 3.0–12.0)
Neutro Abs: 2.5 10*3/uL (ref 1.4–7.7)
Neutrophils Relative %: 65.1 % (ref 43.0–77.0)
Platelets: 496 10*3/uL — ABNORMAL HIGH (ref 150.0–400.0)
RBC: 3.3 M/uL — ABNORMAL LOW (ref 4.22–5.81)
RDW: 15.1 % — ABNORMAL HIGH (ref 11.5–14.6)
WBC: 3.8 10*3/uL — ABNORMAL LOW (ref 4.5–10.5)

## 2010-05-01 ENCOUNTER — Ambulatory Visit: Payer: Self-pay | Admitting: Gastroenterology

## 2010-05-02 ENCOUNTER — Encounter: Payer: Self-pay | Admitting: Gastroenterology

## 2010-05-18 ENCOUNTER — Telehealth: Payer: Self-pay | Admitting: Gastroenterology

## 2010-06-08 ENCOUNTER — Inpatient Hospital Stay (HOSPITAL_COMMUNITY): Admission: RE | Admit: 2010-06-08 | Discharge: 2010-06-15 | Payer: Self-pay | Admitting: Surgery

## 2010-06-08 ENCOUNTER — Encounter: Payer: Self-pay | Admitting: Gastroenterology

## 2010-06-08 ENCOUNTER — Encounter (INDEPENDENT_AMBULATORY_CARE_PROVIDER_SITE_OTHER): Payer: Self-pay | Admitting: Surgery

## 2010-06-16 ENCOUNTER — Encounter (INDEPENDENT_AMBULATORY_CARE_PROVIDER_SITE_OTHER): Payer: Self-pay | Admitting: *Deleted

## 2010-06-23 ENCOUNTER — Encounter: Payer: Self-pay | Admitting: Gastroenterology

## 2010-07-24 ENCOUNTER — Telehealth: Payer: Self-pay | Admitting: Internal Medicine

## 2010-07-26 ENCOUNTER — Encounter: Payer: Self-pay | Admitting: Internal Medicine

## 2010-07-26 ENCOUNTER — Telehealth: Payer: Self-pay | Admitting: Internal Medicine

## 2010-08-01 ENCOUNTER — Ambulatory Visit: Payer: Self-pay | Admitting: Internal Medicine

## 2010-08-03 LAB — CONVERTED CEMR LAB
BUN: 13 mg/dL (ref 6–23)
Basophils Absolute: 0 10*3/uL (ref 0.0–0.1)
Basophils Relative: 0.3 % (ref 0.0–3.0)
CO2: 28 meq/L (ref 19–32)
Calcium: 10.1 mg/dL (ref 8.4–10.5)
Chloride: 105 meq/L (ref 96–112)
Creatinine, Ser: 1.1 mg/dL (ref 0.4–1.5)
Eosinophils Absolute: 0 10*3/uL (ref 0.0–0.7)
Eosinophils Relative: 0.8 % (ref 0.0–5.0)
GFR calc non Af Amer: 69.25 mL/min (ref >60)
Glucose, Bld: 102 mg/dL — ABNORMAL HIGH (ref 70–99)
HCT: 37.4 % — ABNORMAL LOW (ref 39.0–52.0)
Hemoglobin: 12.4 g/dL — ABNORMAL LOW (ref 13.0–17.0)
Iron: 29 ug/dL — ABNORMAL LOW (ref 42–165)
Lymphocytes Relative: 16.2 % (ref 12.0–46.0)
Lymphs Abs: 0.9 10*3/uL (ref 0.7–4.0)
MCHC: 33.2 g/dL (ref 30.0–36.0)
MCV: 87 fL (ref 78.0–100.0)
Monocytes Absolute: 0.3 10*3/uL (ref 0.1–1.0)
Monocytes Relative: 5.4 % (ref 3.0–12.0)
Neutro Abs: 4.3 10*3/uL (ref 1.4–7.7)
Neutrophils Relative %: 77.3 % — ABNORMAL HIGH (ref 43.0–77.0)
Platelets: 303 10*3/uL (ref 150.0–400.0)
Potassium: 4.1 meq/L (ref 3.5–5.1)
RBC: 4.3 M/uL (ref 4.22–5.81)
RDW: 14.2 % (ref 11.5–14.6)
Saturation Ratios: 7 % — ABNORMAL LOW (ref 20.0–50.0)
Sodium: 142 meq/L (ref 135–145)
Transferrin: 295 mg/dL (ref 212.0–360.0)
Vitamin B-12: 547 pg/mL (ref 211–911)
WBC: 5.5 10*3/uL (ref 4.5–10.5)

## 2010-09-25 ENCOUNTER — Telehealth: Payer: Self-pay | Admitting: Internal Medicine

## 2010-10-20 ENCOUNTER — Ambulatory Visit: Payer: Self-pay | Admitting: Internal Medicine

## 2010-10-20 DIAGNOSIS — F419 Anxiety disorder, unspecified: Secondary | ICD-10-CM | POA: Insufficient documentation

## 2010-10-20 DIAGNOSIS — F411 Generalized anxiety disorder: Secondary | ICD-10-CM | POA: Insufficient documentation

## 2010-10-24 ENCOUNTER — Encounter: Payer: Self-pay | Admitting: Gastroenterology

## 2010-11-15 ENCOUNTER — Encounter: Payer: Self-pay | Admitting: Gastroenterology

## 2010-11-17 ENCOUNTER — Encounter (INDEPENDENT_AMBULATORY_CARE_PROVIDER_SITE_OTHER): Payer: Self-pay | Admitting: *Deleted

## 2010-11-17 ENCOUNTER — Encounter
Admission: RE | Admit: 2010-11-17 | Discharge: 2010-11-17 | Payer: Self-pay | Source: Home / Self Care | Attending: Surgery | Admitting: Surgery

## 2010-11-28 ENCOUNTER — Encounter (INDEPENDENT_AMBULATORY_CARE_PROVIDER_SITE_OTHER): Payer: Self-pay | Admitting: *Deleted

## 2010-11-28 ENCOUNTER — Ambulatory Visit (HOSPITAL_COMMUNITY)
Admission: RE | Admit: 2010-11-28 | Discharge: 2010-11-28 | Payer: Self-pay | Source: Home / Self Care | Attending: Surgery | Admitting: Surgery

## 2010-12-10 LAB — CONVERTED CEMR LAB
ALT: 13 units/L (ref 0–53)
AST: 15 units/L (ref 0–37)
Albumin: 3.6 g/dL (ref 3.5–5.2)
Alkaline Phosphatase: 77 units/L (ref 39–117)
Amylase: 89 units/L (ref 27–131)
BUN: 33 mg/dL — ABNORMAL HIGH (ref 6–23)
Bacteria, UA: NEGATIVE
Basophils Absolute: 0 10*3/uL (ref 0.0–0.1)
Basophils Relative: 0.3 % (ref 0.0–3.0)
Bilirubin Urine: NEGATIVE
Bilirubin, Direct: 0.1 mg/dL (ref 0.0–0.3)
CO2: 28 meq/L (ref 19–32)
Calcium: 9.6 mg/dL (ref 8.4–10.5)
Chloride: 103 meq/L (ref 96–112)
Creatinine, Ser: 1.2 mg/dL (ref 0.4–1.5)
Crystals: NEGATIVE
Eosinophils Absolute: 0 10*3/uL (ref 0.0–0.7)
Eosinophils Relative: 0.1 % (ref 0.0–5.0)
GFR calc Af Amer: 79 mL/min
GFR calc non Af Amer: 65 mL/min
Glucose, Bld: 112 mg/dL — ABNORMAL HIGH (ref 70–99)
HCT: 38.1 % — ABNORMAL LOW (ref 39.0–52.0)
Hemoglobin, Urine: NEGATIVE
Hemoglobin: 12.8 g/dL — ABNORMAL LOW (ref 13.0–17.0)
Ketones, ur: 15 mg/dL — AB
Leukocytes, UA: NEGATIVE
Lymphocytes Relative: 13.5 % (ref 12.0–46.0)
MCHC: 33.6 g/dL (ref 30.0–36.0)
MCV: 91.6 fL (ref 78.0–100.0)
Monocytes Absolute: 0.6 10*3/uL (ref 0.1–1.0)
Monocytes Relative: 5.4 % (ref 3.0–12.0)
Neutro Abs: 9.3 10*3/uL — ABNORMAL HIGH (ref 1.4–7.7)
Neutrophils Relative %: 80.7 % — ABNORMAL HIGH (ref 43.0–77.0)
Nitrite: NEGATIVE
Platelets: 409 10*3/uL — ABNORMAL HIGH (ref 150–400)
Potassium: 4.3 meq/L (ref 3.5–5.1)
RBC: 4.16 M/uL — ABNORMAL LOW (ref 4.22–5.81)
RDW: 13.3 % (ref 11.5–14.6)
Sodium: 143 meq/L (ref 135–145)
Specific Gravity, Urine: 1.01 (ref 1.000–1.03)
Total Bilirubin: 0.6 mg/dL (ref 0.3–1.2)
Total Protein, Urine: NEGATIVE mg/dL
Total Protein: 6.5 g/dL (ref 6.0–8.3)
Urine Glucose: NEGATIVE mg/dL
Urobilinogen, UA: 0.2 (ref 0.0–1.0)
WBC: 11.5 10*3/uL — ABNORMAL HIGH (ref 4.5–10.5)
pH: 5.5 (ref 5.0–8.0)

## 2010-12-12 NOTE — Assessment & Plan Note (Signed)
Summary: 2 MO ROV/NWS   Vital Signs:  Patient profile:   65 year old male Weight:      179 pounds Temp:     97.5 degrees F oral Pulse rate:   92 / minute BP sitting:   160 / 98  (left arm)  Vitals Entered By: Tora Perches (September 26, 2009 8:42 AM) CC: f/u Is Patient Diabetic? No   Primary Care Provider:  Trinna Post Wynnie Pacetti,MD  CC:  f/u.  History of Present Illness: The patient presents for a follow up of LBP, GERD, hypertension, hyperlipidemia   Preventive Screening-Counseling & Management  Alcohol-Tobacco     Smoking Status: never  Current Medications (verified): 1)  Protonix 40 Mg  Tbec (Pantoprazole Sodium) .... Take 1 Tablet By Mouth Two Times A Day 2)  Gaviscon 80-14.2 Mg Chew (Alum Hydroxide-Mag Trisilicate) .... 2 By Mouth As Needed 3)  Vitamin D3 1000 Unit  Tabs (Cholecalciferol) .... 2  By Mouth Daily 4)  Oxycodone Hcl 15 Mg Tabs (Oxycodone Hcl) .Marland Kitchen.. 1 By Mouth Qid As Needed Pain. Please,  Fill On or After Sep 21, 2009 5)  Promethazine Hcl 25 Mg  Tabs (Promethazine Hcl) .Marland Kitchen.. 1 By Mouth Two Times A Day As Needed Nausea 6)  Lomotil 2.5-0.025 Mg Tabs (Diphenoxylate-Atropine) .Marland Kitchen.. 1-2 By Mouth Two Times A Day As Needed Diarrhea 7)  Flonase 50 Mcg/act Susp (Fluticasone Propionate) .... 2 Sprays Each Nostril 8)  Bifera 28 Mg Tabs (Polysacch Fe Cmp-Fe Heme Poly) .... Once Daily  Allergies: 1)  ! Codeine Phosphate (Codeine Phosphate) 2)  Acetaminophen (Acetaminophen)  Past History:  Past Medical History: Last updated: 03/01/2009 Anemia-iron deficiency Hyperplastic colonic polyps, hx of 01/2000 Diverticulosis, colon Hypertension Low back pain, chronic Nephrolithiasis, hx of  B GERD Peptic ulcer disease with bleeding and GOO, H. pylori Ab negative, 08/2008 Benign prostatic hypertrophy Allergic rhinitis Degenerative disk disease Hyperlipidemia Atrial arrhythmia Renal cyst Duodenal stricture with gastric outlet obstruction Hemorrhoids Barretts Esophagus  without dysplasia  Past Surgical History: Last updated: 10/11/2008 BACK SURGERY 02/2003 ON L5 Fusion LS 2009 Dr Jeral Fruit status post repair of hiatal hernia repair appendectomy  Family History: Last updated: 10/11/2008 Family History Hypertension Family History of Colon Cancer: Mother died at age 37 colon cancer. Father died of colon cancer at age 59, Paternal uncle x 2, paternal cousin Family History of Prostate Cancer: Father Family History of Colon Polyps:Father, Mother  Social History: Last updated: 10/11/2008 Occupation: [employer] Married Never Smoked-as a teenager Alcohol use-no Patient gets regular exercise.  Physical Exam  General:  Well developed, well nourished, no acute distress. Nose:  External nasal examination shows no deformity or inflammation. Nasal mucosa are pink and moist without lesions or exudates. Mouth:  Oral mucosa and oropharynx without lesions or exudates.  Teeth in good repair. Lungs:  Clear throughout to auscultation. Heart:  Regular rate and rhythm; no murmurs, rubs,  or bruits. Abdomen:  Soft, nontender and nondistended. No masses, hepatosplenomegaly or hernias noted. Normal bowel sounds. Msk:  Lumbar-sacral spine is tender to palpation over paraspinal muscles and painfull with the ROM  Neurologic:  Alert and  oriented x4;  grossly normal neurologically. Skin:  Clear Psych:  Alert and cooperative. Normal mood and affect.   Impression & Recommendations:  Problem # 1:  LOW BACK PAIN (ICD-724.2) Assessment Improved  His updated medication list for this problem includes:    Oxycodone Hcl 15 Mg Tabs (Oxycodone hcl) .Marland Kitchen... 1 by mouth qid as needed pain. please,  fill on or after Nov 21, 2009  Problem # 2:  ULCER-GASTRIC (ICD-531.9) Assessment: Improved  His updated medication list for this problem includes:    Protonix 40 Mg Tbec (Pantoprazole sodium) .Marland Kitchen... Take 1 tablet by mouth two times a day    Gaviscon 80-14.2 Mg Chew (Alum hydroxide-mag  trisilicate) .Marland Kitchen... 2 by mouth as needed  Problem # 3:  HYPERTENSION (ICD-401.9) Assessment: Deteriorated  His updated medication list for this problem includes:    Cozaar 50 Mg Tabs (Losartan potassium) .Marland Kitchen... Take 1 tab by mouth every day  Complete Medication List: 1)  Protonix 40 Mg Tbec (Pantoprazole sodium) .... Take 1 tablet by mouth two times a day 2)  Gaviscon 80-14.2 Mg Chew (Alum hydroxide-mag trisilicate) .... 2 by mouth as needed 3)  Vitamin D3 1000 Unit Tabs (Cholecalciferol) .... 2  by mouth daily 4)  Oxycodone Hcl 15 Mg Tabs (Oxycodone hcl) .Marland Kitchen.. 1 by mouth qid as needed pain. please,  fill on or after Nov 21, 2009 5)  Promethazine Hcl 25 Mg Tabs (Promethazine hcl) .Marland Kitchen.. 1 by mouth two times a day as needed nausea 6)  Lomotil 2.5-0.025 Mg Tabs (Diphenoxylate-atropine) .Marland Kitchen.. 1-2 by mouth two times a day as needed diarrhea 7)  Flonase 50 Mcg/act Susp (Fluticasone propionate) .... 2 sprays each nostril 8)  Bifera 28 Mg Tabs (Polysacch fe cmp-fe heme poly) .... Once daily 9)  Cozaar 50 Mg Tabs (Losartan potassium) .... Take 1 tab by mouth every day  Other Orders: Admin 1st Vaccine (16109) Flu Vaccine 64yrs + (60454)  Patient Instructions: 1)  Please schedule a follow-up appointment in 2 months. Prescriptions: COZAAR 50 MG TABS (LOSARTAN POTASSIUM) Take 1 tab by mouth every day  #30 x 12   Entered and Authorized by:   Tresa Garter MD   Signed by:   Tresa Garter MD on 09/26/2009   Method used:   Print then Give to Patient   RxID:   [id number] OXYCODONE HCL 15 MG TABS (OXYCODONE HCL) 1 by mouth qid as needed pain. Please,  fill on or after Nov 21, 2009  #120 x 0   Entered and Authorized by:   Tresa Garter MD   Signed by:   Tresa Garter MD on 09/26/2009   Method used:   Print then Give to Patient   RxID:   [id number] OXYCODONE HCL 15 MG TABS (OXYCODONE HCL) 1 by mouth qid as needed pain. Please,  fill on or after Oct 21, 2009  #120 x 0    Entered and Authorized by:   Tresa Garter MD   Signed by:   Tresa Garter MD on 09/26/2009   Method used:   Print then Give to Patient   RxID:   [id number]   Flu Vaccine Consent Questions     Do you have a history of severe allergic reactions to this vaccine? no    Any prior history of allergic reactions to egg and/or gelatin? no    Do you have a sensitivity to the preservative Thimersol? no    Do you have a past history of Guillan-Barre Syndrome? no    Do you currently have an acute febrile illness? no    Have you ever had a severe reaction to latex? no    Vaccine information given and explained to patient? yes    Are you currently pregnant? no    Lot Number:[id number]   Exp Date:05/11/2010   Site Given  Left Deltoid IM  Influenza Vaccine (to be given  today)      .lbflu

## 2010-12-12 NOTE — Letter (Signed)
[facility]   Imported By: Sherian Rein 05/17/2010 11:59:47  _____________________________________________________________________  External Attachment:    Type:   Image     Comment:   External Document

## 2010-12-12 NOTE — Consult Note (Signed)
Summary: Duodenal ulcer with bleed  NAME:  Cody Frazier, Cody Frazier               ACCOUNT NO.:  [id number]      MEDICAL RECORD NO.:  [id number]          PATIENT TYPE:  INP      LOCATION:  3312                         FACILITY:  [facility]      PHYSICIAN:  Lennie Muckle, MD      DATE OF BIRTH:  [birth year]      DATE OF CONSULTATION:  04/16/2010   DATE OF DISCHARGE:                                    CONSULTATION      REQUESTING PHYSICIAN:  Dr. Christella Hartigan.      REASON FOR CONSULTATION:  Recurrent duodenal ulcer with bleed.      Cody Frazier is a pleasant 65 year old male who apparently was just   recently admitted in Apr 09, 2010, for urinary retention, was found   partially after a syncopal episode by his wife on April 15, 2010 .  He did   have a history of GI bleeding which required hospitalization in 2009.   He was treated with cautery having an injection after this.  Had a   repeat endoscopy today by Dr. Joesph Fillers which found only a 2 cm ulcer   with a visible vessel.  This was successfully stopped but was somewhat   difficult.  He received 7 units of transfusion since admission.  He has   had no positive history of H. pylori.  By report, he had to have   dilation by Dr. Russella Dar due to gastric outlet obstruction.  He states he   has been on Protonix for 2 years.  He has had hyperacid secretions since   child.  He states he has had multiple change in medications.  He states   he has had no complaints of abdominal pain.  He is on iron and has dark   stools.  We were asked to follow in case he does re-bleed requiring   surgery.  Dr. Christella Hartigan does not feel as though he will be able to contain   the bleeding if this occurs again.      PAST MEDICAL HISTORY:  Significant for:   1. Back pain.   2. Hypertension.   3. Paroxysmal atrial fibrillation.   4. Benign prostate hypertrophy.   5. Allergies.   6. Nephrolithiasis.      PAST SURGICAL HISTORY:  A Nissen approximately 20 years ago.  He had two   back  surgeries.      ALLERGIES:  No allergies.      CURRENT MEDICATIONS:  At home include Cozaar, Bifera, Flonase,   oxycodone, vitamin, Gaviscon and Protonix.      FAMILY HISTORY:  Both his family members had colon cancer.  He had a   colonoscopy 2 years ago.      SOCIAL HISTORY:  Currently lives with his wife, denied tobacco or   alcohol use.      REVIEW OF SYSTEMS:  As per the HPI and he also has back pain.      PHYSICAL EXAMINATION:  GENERAL:  He is lying in bed in no acute  distress.  Overall, he has good color.   VITAL SIGNS:  Blood pressure is 118/58, heart rate slightly tachycardic   120, temperature 98.8.   HEENT:  Head is normocephalic, atraumatic.  Extraocular muscles are   intact.  His oral mucosa is moist.   CHEST:  Clear to auscultation bilaterally.  Normal expansion, excursion.   CARDIOVASCULAR:  Tachycardic.  No murmurs.   ABDOMEN:  Scaphoid, soft, nontender, nondistended.  No organomegaly.   EXTREMITIES:  No edema.  No deformities.   SKIN:  No rashes.  Color is good.      LABORATORIES:  Most recent hemoglobin and hematocrit is 10 and 29.5.  On   admission, his hemoglobin was 6.4.  Metabolic profile reveals a BUN of   36, creatinine 0.9, calcium 8.2.      ASSESSMENT/PLAN:  History of a duodenal ulcer with recurrent bleed.   Negative history of Helicobacter pylori.  Currently is under control.   Currently on Protonix.  Given his history of recurrent ulcers and   disease possibly check a serum gastrin level.  I talked with Cody Frazier   and his wife that he perhaps need an elective procedure for resection of   the area.  Perhaps a vagotomy if his gastrin levels are elevated and his   history of ulcer disease.  Obviously, the Nissen from the past would   make this somewhat more difficult.  If he does have an acute bleed   during the hospital this will require oversewing of ulcer and possible   biopsy.  There is always a concern for a malignancy underlying the ulcer    disease.  We will continue to follow for any acute change.               Lennie Muckle, MD            ALA/MEDQ  D:  04/16/2010  T:  04/17/2010  Job:  [id number]      cc:   Cody Quint. Plotnikov, MD

## 2010-12-12 NOTE — Progress Notes (Signed)
Summary: percocet refill  Phone Note Refill Request Call back at Home Phone [phone] Message from:  Patient on April 20, 2008 1:57 PM  Refills Requested: Medication #1:  PERCOCET 10-325 MG  TABS 1po qid as needed for pain Please   Last Refilled: 03/22/2008 Pleas print and sign for pt to pick up. Thanks   Method Requested: Pick up at Office Initial call taken by: Rock Nephew CMA,  April 20, 2008 1:57 PM Caller: Patient  Follow-up for Phone Call        OK to ref Follow-up by: Tresa Garter MD,  April 20, 2008 5:06 PM  Additional Follow-up for Phone Call Additional follow up Details #1::        Lf mess for pt to pick up tomorrow, rx reprinted b/c orig said qty 1200 Additional Follow-up by: Lamar Sprinkles,  April 21, 2008 5:21 PM    New/Updated Medications: PERCOCET 10-325 MG  TABS (OXYCODONE-ACETAMINOPHEN) 1po qid as needed for pain Please,  fill on or after 6/12/9   Prescriptions: PERCOCET 10-325 MG  TABS (OXYCODONE-ACETAMINOPHEN) 1po qid as needed for pain Please,  fill on or after 6/12/9  #120 x 0   Entered by:   Lamar Sprinkles   Authorized by:   Tresa Garter MD   Signed by:   Lamar Sprinkles on 04/21/2008   Method used:   Print then Give to Patient   RxID:   [id number] PERCOCET 10-325 MG  TABS (OXYCODONE-ACETAMINOPHEN) 1po qid as needed for pain Please,  fill on or after 6/12/9  #1200 x 0   Entered by:   Lamar Sprinkles   Authorized by:   Tresa Garter MD   Signed by:   Lamar Sprinkles on 04/21/2008   Method used:   Print then Give to Patient   RxID:   [id number]

## 2010-12-12 NOTE — Progress Notes (Signed)
Summary: samples  Phone Note From Pharmacy   Caller: [facility]  [address]  [id number] Summary of Call:  On 12/26/07 patient Hyzarr was switched to fosinopril for insurance purpose. Pharmacy stated that this medication is temp unavailable. Is it ok for patient to have sample of the Hyzaa 100-25 until fosinopril becomes avail? Initial call taken by: Rock Nephew CMA,  January 02, 2008 12:24 PM  Follow-up for Phone Call        OK samples Follow-up by: Tresa Garter MD,  January 02, 2008 5:23 PM  Additional Follow-up for Phone Call Additional follow up Details #1::        Patient notified, he will check back with pharmacy to she when medication will be avail. Additional Follow-up by: Rock Nephew CMA,  January 05, 2008 8:54 AM

## 2010-12-12 NOTE — Progress Notes (Signed)
Summary: REFILL - OXYCODONE  Phone Note Refill Request   Refills Requested: Medication #1:  OXYCODONE HCL 15 MG TABS 1 by mouth qid as needed pain. Please Initial call taken by: Lamar Sprinkles, CMA,  September 25, 2010 9:33 AM  Follow-up for Phone Call        ok to fill Follow-up by: Tresa Garter MD,  September 25, 2010 8:58 PM  Additional Follow-up for Phone Call Additional follow up Details #1::        Left vm that rx is ready for pick up Additional Follow-up by: Lamar Sprinkles, CMA,  September 26, 2010 10:43 AM    New/Updated Medications: OXYCODONE HCL 15 MG TABS (OXYCODONE HCL) 1 by mouth qid as needed pain. Please,  fill on or after   09/26/10         . OK to fill 1-2 day earlier when the month has 31 days in it. Prescriptions: OXYCODONE HCL 15 MG TABS (OXYCODONE HCL) 1 by mouth qid as needed pain. Please,  fill on or after   09/26/10         . OK to fill 1-2 day earlier when the month has 31 days in it.  #120 x 0   Entered by:   Lamar Sprinkles, CMA   Authorized by:   Tresa Garter MD   Signed by:   Lamar Sprinkles, CMA on 09/26/2010   Method used:   Print then Give to Patient   RxID:   [id number]

## 2010-12-12 NOTE — Assessment & Plan Note (Signed)
Summary: SHINGLES VAC/NML  Nurse Visit    Prior Medications: PREVACID 30 MG CPDR (LANSOPRAZOLE) qd RANITIDINE HCL 150 MG  CAPS (RANITIDINE HCL) once daily FLOMAX 0.4 MG  CP24 (TAMSULOSIN HCL) once daily CHROMAGEN FORTE 50-101-1 MG  TABS (FEASP-FEFUM -SUC-C-THRE-B12-FA) once daily PERCOCET 10-325 MG  TABS (OXYCODONE-ACETAMINOPHEN) 1po qid as needed for pain Please,  fill on or after 4/12/9 SIMVASTATIN 80 MG TABS (SIMVASTATIN) 1 once daily po FLONASE 50 MCG/ACT SUSP (FLUTICASONE PROPIONATE) 2 sprays each day ALLEGRA 180 MG TABS (FEXOFENADINE HCL) 1 by mouth once daily as needed allergies FOSINOPRIL SODIUM-HCTZ 20-12.5 MG  TABS (FOSINOPRIL SODIUM-HCTZ) 1 by mouth qam VITAMIN D3 1000 UNIT  TABS (CHOLECALCIFEROL) 1 qd Current Allergies: ! CODEINE PHOSPHATE (CODEINE PHOSPHATE)   Zostavax # 1    Vaccine Type: Zostavax    Site: left deltoid    Mfr: Merck    Dose: 0.65    Route: [place]    Given by: Rock Nephew CMA    Exp. Date: 03/09/2009    Lot #: [id number]    VIS given: 08/24/05 given February 17, 2008.   Orders Added: 1)  Zoster (Shingles) Vaccine Live [90736] 2)  Admin 1st Vaccine [email or web address]    ]

## 2010-12-12 NOTE — Progress Notes (Signed)
Summary: Unable to find any Flora q   Phone Note Call from Patient Call back at Home Phone [phone]   Call For: Dr Russella Dar Summary of Call: Unable to find Flora Q in any pharmacy. What should he do? Initial call taken by: Leanor Kail [facility],  April 21, 2010 11:01 AM  Follow-up for Phone Call        [facility] Flora-Q [facility] but have a alternative probiotic called Ultimate Flora and the pharmacist told him they are the same thing. I told him that was fine to take as a alternative but could also ask the pharmacist to order the Flora-Q for him if they did not have it in the store. Pt states he will ask but he might just start taking the other alternative medicine.  Follow-up by: Christie Nottingham CMA [facility]),  April 21, 2010 1:36 PM

## 2010-12-12 NOTE — Progress Notes (Signed)
Summary: Rf requests for meds not on list  Phone Note Refill Request   Refills Requested: Medication #1:  Metoprolol Tartrate 50mg  1 bid  (not on active med list)   Supply Requested: 60  Medication #2:  Metoclopramide 10mg  1 bid (not on active list)   Supply Requested: 60  Method Requested: Electronic Initial call taken by: Lanier Prude, [facility]),  July 24, 2010 8:59 AM  Follow-up for Phone Call        Has he been taking Metoprolol? Re: Metoclopramide - pls chek with your surgeon Follow-up by: Tresa Garter MD,  July 24, 2010 4:34 PM  Additional Follow-up for Phone Call Additional follow up Details #1::        spoke with pt and he states his surgeon put him on these medications. He has appt with surg on wens. and will check with his surg about these two medications Additional Follow-up by: Ami Bullins CMA,  July 24, 2010 6:06 PM

## 2010-12-12 NOTE — Progress Notes (Signed)
Summary: RF Oxycodone  Phone Note Refill Request Call back at Home Phone [phone]   Refills Requested: Medication #1:  OXYCODONE HCL 15 MG TABS 1 by mouth qid as needed pain. Please Also req flonase rf  Initial call taken by: Lamar Sprinkles,  May 10, 2009 8:08 AM  Follow-up for Phone Call        OK to ref Follow-up by: Tresa Garter MD,  May 10, 2009 6:06 PM  Additional Follow-up for Phone Call Additional follow up Details #1::        Notified rx ready for pick-up Additional Follow-up by: Orlan Leavens,  May 11, 2009 2:15 PM    New/Updated Medications: OXYCODONE HCL 15 MG TABS (OXYCODONE HCL) 1 by mouth qid as needed pain. Please,  fill on or after  May 19, 2009 FLONASE 50 MCG/ACT SUSP (FLUTICASONE PROPIONATE) 2 sprays each nostril   Prescriptions: OXYCODONE HCL 15 MG TABS (OXYCODONE HCL) 1 by mouth qid as needed pain. Please,  fill on or after  May 19, 2009  #120 x 0   Entered by:   Orlan Leavens   Authorized by:   Tresa Garter MD   Signed by:   Orlan Leavens on 05/11/2009   Method used:   Print then Give to Patient   RxID:   [id number] OXYCODONE HCL 15 MG TABS (OXYCODONE HCL) 1 by mouth qid as needed pain. Please,  fill on or after  April 19, 2009  #120 x 0   Entered by:   Orlan Leavens   Authorized by:   Tresa Garter MD   Signed by:   Orlan Leavens on 05/11/2009   Method used:   Print then Give to Patient   RxID:   [id number] FLONASE 50 MCG/ACT SUSP (FLUTICASONE PROPIONATE) 2 sprays each nostril  #1 mth x 0   Entered by:   Lamar Sprinkles   Authorized by:   Tresa Garter MD   Signed by:   Lamar Sprinkles on 05/10/2009   Method used:   [facility] to        [facility] [address]       [address]       [place], [place]  [address]       Ph: [phone] or [phone]       Fax: [phone]   RxID:   [id number]

## 2010-12-12 NOTE — Assessment & Plan Note (Signed)
Summary: follow up ZEG/sheri   History of Present Illness Visit Type: Follow-up Visit Primary GI MD: Elie Goody MD [facility] Primary Provider: Trinna Post Plotnikov,MD Chief Complaint: Follow up from hospital EGD History of Present Illness:   Mr. Carton returns following endoscopy with balloon dilation of duodenal stricture approximately one month ago. He has noted a substantial improvement in his symptoms since the dilation. A small duodenal ulcer was noted at the endoscopy and he denies aspirin or NSAID usage. He Helicobacter pylori antibody was negative in October 2009.   GI Review of Systems    Reports abdominal pain and  bloating.     Location of  Abdominal pain: RUQ.    Denies acid reflux, belching, chest pain, dysphagia with liquids, dysphagia with solids, heartburn, loss of appetite, nausea, vomiting, vomiting blood, weight loss, and  weight gain.        Denies anal fissure, black tarry stools, change in bowel habit, constipation, diarrhea, diverticulosis, fecal incontinence, heme positive stool, hemorrhoids, irritable bowel syndrome, jaundice, light color stool, liver problems, rectal bleeding, and  rectal pain.   Current Medications (verified): 1)  Protonix 40 Mg  Tbec (Pantoprazole Sodium) .... Take 1 Tablet By Mouth Two Times A Day 2)  Gaviscon 80-14.2 Mg Chew (Alum Hydroxide-Mag Trisilicate) .... 2 By Mouth As Needed 3)  Vitamin D3 1000 Unit  Tabs (Cholecalciferol) .... 2  By Mouth Daily 4)  Oxycodone Hcl 15 Mg Tabs (Oxycodone Hcl) .Marland Kitchen.. 1 By Mouth Qid As Needed Pain. Please,  Fill On or After  02/18/09          . Ok To Fill 1 Day Earlier When The Month Has 31 Days in It.  Allergies (verified): 1)  ! Codeine Phosphate (Codeine Phosphate) 2)  Acetaminophen (Acetaminophen)  Past History:  Past Surgical History:    BACK SURGERY 02/2003 ON L5    Fusion LS 2009 Dr Jeral Fruit    status post repair of hiatal hernia repair    appendectomy     (10/11/2008)  Family History:    Family  History Hypertension    Family History of Colon Cancer: Mother died at age 77 colon cancer. Father died of colon cancer at age 29, Paternal uncle x 2, paternal cousin    Family History of Prostate Cancer: Father    Family History of Colon Polyps:Father, Mother     (10/11/2008)  Past Medical History:    Anemia-iron deficiency    Hyperplastic colonic polyps, hx of 01/2000    Diverticulosis, colon    Hypertension    Low back pain, chronic    Nephrolithiasis, hx of  B    GERD    Peptic ulcer disease with bleeding and GOO, H. pylori Ab negative, 08/2008    Benign prostatic hypertrophy    Allergic rhinitis    Degenerative disk disease    Hyperlipidemia    Atrial arrhythmia    Renal cyst    Duodenal stricture with gastric outlet obstruction    Hemorrhoids    Barretts Esophagus without dysplasia  Social History:    Reviewed history from 10/11/2008 and no changes required:       Occupation: [employer]       Married       Never Smoked-as a teenager       Alcohol use-no       Patient gets regular exercise.  Review of Systems       The patient complains of allergy/sinus, back pain, muscle pains/cramps, sleeping problems, thirst - excessive,  and urination - excessive.         The pertinent positives and negatives are noted as above and in the HPI. All other ROS were reviewed and were negative.   Vital Signs:  Patient profile:   65 year old male Height:      70 inches Weight:      190.13 pounds BMI:     27.38 BSA:     2.04 Pulse rate:   80 / minute Pulse rhythm:   regular BP sitting:   112 / 78  (left arm)  Vitals Entered By: Merri Ray CMA (March 01, 2009 2:11 PM)  Physical Exam  General:  Well developed, well nourished, no acute distress. Head:  Normocephalic and atraumatic. Mouth:  No deformity or lesions, dentition normal. Lungs:  Clear throughout to auscultation. Heart:  Regular rate and rhythm; no murmurs, rubs,  or bruits. Abdomen:  Soft, nontender and  nondistended. No masses, hepatosplenomegaly or hernias noted. Normal bowel sounds. Psych:  Alert and cooperative. Normal mood and affect.   Impression & Recommendations:  Problem # 1:  OTHER OBSTRUCTION OF DUODENUM (ICD-537.3) Assessment Improved Duodenal stricture which has responded well to endoscopic balloon dilation. He is to maintain a low-fat diet and avoid overeating. Continue Protonix 40 mg twice daily and continue to avoid aspirin and NSAID products indefinitely  Problem # 2:  ULCER-DUODENAL (ICD-532.9) Assessment: Unchanged Management as in problem #1. Helicobacter Pylori antibody was negative in October 2009.  Problem # 3:  BARRETTS ESOPHAGUS (ICD-530.85) Assessment: Unchanged Continue standard antireflux measures and accounts 40 mg twice daily.  Patient Instructions: 1)  Please schedule a follow-up appointment in 6 months. 2)  The medication list was reviewed and reconciled.  All changed / newly prescribed medications were explained.  A complete medication list was provided to the patient / caregiver.

## 2010-12-12 NOTE — Miscellaneous (Signed)
Summary: Waiver of Liability/[facility]  Waiver of Liability/[facility]   Imported By: Lanelle Bal 01/18/2009 10:19:32  _____________________________________________________________________  External Attachment:    Type:   Image     Comment:   External Document

## 2010-12-12 NOTE — Assessment & Plan Note (Signed)
Summary: 2 MO ROV/NWS   Vital Signs:  Patient Profile:   65 Years Old Male Weight:      178 pounds Temp:     98.6 degrees F oral Pulse rate:   89 / minute BP sitting:   134 / 86  (left arm)  Vitals Entered By: Tora Perches (October 22, 2007 8:16 AM)             Is Patient Diabetic? No     Chief Complaint:  Multiple medical problems or concerns.  History of Present Illness: The patient presents for a follow up of back pain, anxiety, depression and headaches. 2 R toes are numb.   Current Allergies: ! CODEINE PHOSPHATE (CODEINE PHOSPHATE)  Past Medical History:    Reviewed history from 09/11/2007 and no changes required:       Anemia-iron deficiency       Colonic polyps, hx of       Diverticulosis, colon       Hypertension       Low back pain, chronic       Nephrolithiasis, hx of  B       GERD       Peptic ulcer disease       Benign prostatic hypertrophy       Allergic rhinitis  Past Surgical History:    Reviewed history from 06/09/2007 and no changes required:       BACK SURGERY 02/2003 ON L5   Family History:    Reviewed history from 09/11/2007 and no changes required:       Family History Hypertension  Social History:    Reviewed history from 09/11/2007 and no changes required:       Occupation: [employer]       Married       Never Smoked       Alcohol use-no       Regular exercise-no    Review of Systems  The patient denies anorexia, syncope, abdominal pain, depression, and hematuria.     Physical Exam  General:     Well-developed,well-nourished,in no acute distress; alert,appropriate and cooperative throughout examination Eyes:     No corneal or conjunctival inflammation noted. EOMI. Perrla. Funduscopic exam benign, without hemorrhages, exudates or papilledema. Vision grossly normal. Nose:     External nasal examination shows no deformity or inflammation. Nasal mucosa are pink and moist without lesions or exudates. Mouth:     Oral mucosa  and oropharynx without lesions or exudates.  Teeth in good repair. Neck:     No deformities, masses, or tenderness noted. Lungs:     Normal respiratory effort, chest expands symmetrically. Lungs are clear to auscultation, no crackles or wheezes. Heart:     Normal rate and regular rhythm. S1 and S2 normal without gallop, murmur, click, rub or other extra sounds. Abdomen:     Bowel sounds positive,abdomen soft and non-tender without masses, organomegaly or hernias noted. Msk:     Lumbar-sacral spine is tender to palpation over paraspinal muscles and painfull with the ROM  Neurologic:     No cranial nerve deficits noted. Station and gait are normal. Plantar reflexes are down-going bilaterally. DTRs are symmetrical throughout. Sensory, motor and coordinative functions appear intact. Skin:     Intact without suspicious lesions or rashes    Impression & Recommendations:  Problem # 1:  ABDOMINAL PAIN, UNSPECIFIED SITE (ICD-789.00) Assessment: Improved  Problem # 2:  NEPHROLITHIASIS, HX OF (ICD-V13.01) Assessment: Improved No recurrence  Problem # 3:  PURE HYPERCHOLESTEROLEMIA (ICD-272.0) Assessment: Unchanged  The following medications were removed from the medication list:    Simvastatin 40 Mg Tabs (Simvastatin) ..... Once daily  His updated medication list for this problem includes:    Simvastatin 80 Mg Tabs (Simvastatin) .Marland Kitchen... 1 once daily by mouth if tol.   Problem # 4:  LOW BACK PAIN (ICD-724.2) Assessment: Unchanged  His updated medication list for this problem includes:    Percocet 10-325 Mg Tabs (Oxycodone-acetaminophen) .Marland Kitchen... 1po qid as needed for pain please,  fill on or after 11/27/07   Complete Medication List: 1)  Hyzaar 100-25 Mg Tabs (Losartan potassium-hctz) .... Qd 2)  Prevacid 30 Mg Cpdr (Lansoprazole) .... Qd 3)  Ranitidine Hcl 150 Mg Caps (Ranitidine hcl) .... Once daily 4)  Allegra-d 12 Hour 60-120 Mg Tb12 (Fexofenadine-pseudoephedrine) .Marland Kitchen.. 1 by mouth  two times a day as needed allergy 5)  Nasacort Aq 55 Mcg/act Aers (Triamcinolone acetonide(nasal)) .... One spr.once daily 6)  Flomax 0.4 Mg Cp24 (Tamsulosin hcl) .... Once daily 7)  Chromagen Forte 50-101-1 Mg Tabs (Feasp-fefum -suc-c-thre-b12-fa) .... Once daily 8)  Vitamin D3 1000 Unit Tabs (Cholecalciferol) .Marland Kitchen.. 1 qd 9)  Percocet 10-325 Mg Tabs (Oxycodone-acetaminophen) .Marland Kitchen.. 1po qid as needed for pain please,  fill on or after 11/27/07 10)  Simvastatin 80 Mg Tabs (Simvastatin) .Marland Kitchen.. 1 once daily po    Patient Instructions: 1)  Please schedule a follow-up appointment in 3 months. 2)  BMP prior to visit, ICD-9: 3)  Hepatic Panel prior to visit, ICD-9: 4)  Lipid Panel prior to visit, ICD-9:272.0 995.2    Prescriptions: NASACORT AQ 55 MCG/ACT  AERS (TRIAMCINOLONE ACETONIDE(NASAL)) one spr.once daily  #1 x 12   Entered and Authorized by:   Tresa Garter MD   Signed by:   Tresa Garter MD on 10/22/2007   Method used:   Print then Give to Patient   RxID:   [id number] ALLEGRA-D 12 HOUR 60-120 MG  TB12 (FEXOFENADINE-PSEUDOEPHEDRINE) 1 by mouth two times a day as needed allergy  #60 x 12   Entered and Authorized by:   Tresa Garter MD   Signed by:   Tresa Garter MD on 10/22/2007   Method used:   Print then Give to Patient   RxID:   [id number] PERCOCET 10-325 MG  TABS (OXYCODONE-ACETAMINOPHEN) 1po qid as needed for pain Please,  fill on or after 11/27/07  #120 x 0   Entered and Authorized by:   Tresa Garter MD   Signed by:   Tresa Garter MD on 10/22/2007   Method used:   Print then Give to Patient   RxID:   [id number] PERCOCET 10-325 MG  TABS (OXYCODONE-ACETAMINOPHEN) 1po qid as needed for pain Please,  fill on or after 10/27/07  #120 x 0   Entered and Authorized by:   Tresa Garter MD   Signed by:   Tresa Garter MD on 10/22/2007   Method used:   Print then Give to Patient   RxID:   [id number] SIMVASTATIN 80 MG  TABS (SIMVASTATIN) 1 once daily po  #30 x 12   Entered and Authorized by:   Tresa Garter MD   Signed by:   Tresa Garter MD on 10/22/2007   Method used:   Print then Give to Patient   RxID:   [id number]  ]

## 2010-12-12 NOTE — Progress Notes (Signed)
Summary: meds  Phone Note Call from Patient Call back at [facility] Phone [phone] Call back at [phone]   Summary of Call: In reference to yesterday's call, pt states he no longer needs to take metoprolol, but will need to take metoclopramide 10mg  on a as needed basis. needs rx on oxycodone 15mg  x 120 Initial call taken by: Migdalia Dk,  July 26, 2010 9:41 AM  Follow-up for Phone Call        ok Oxycod #120 and Metoclopr  Follow-up by: Tresa Garter MD,  July 26, 2010 1:24 PM  Additional Follow-up for Phone Call Additional follow up Details #1::        Rx ready, Pt informed  Additional Follow-up by: Lamar Sprinkles, CMA,  July 26, 2010 6:38 PM    New/Updated Medications: OXYCODONE HCL 15 MG TABS (OXYCODONE HCL) 1 by mouth qid as needed pain. Please,  fill on or after   07/27/10         . OK to fill 1-2 day earlier when the month has 31 days in it. METOCLOPRAMIDE HCL 10 MG TABS (METOCLOPRAMIDE HCL) 1 by mouth three times a day ac prn Prescriptions: OXYCODONE HCL 15 MG TABS (OXYCODONE HCL) 1 by mouth qid as needed pain. Please,  fill on or after   07/27/10         . OK to fill 1-2 day earlier when the month has 31 days in it.  #120 x 0   Entered by:   Lamar Sprinkles, CMA   Authorized by:   Tresa Garter MD   Signed by:   Lamar Sprinkles, CMA on 07/26/2010   Method used:   Print then Give to Patient   RxID:   [id number] METOCLOPRAMIDE HCL 10 MG TABS (METOCLOPRAMIDE HCL) 1 by mouth three times a day ac prn  #90 x 3   Entered by:   Lamar Sprinkles, CMA   Authorized by:   Tresa Garter MD   Signed by:   Lamar Sprinkles, CMA on 07/26/2010   Method used:   Electronically to        [id number] (retail)       [address]       [place], [place]  [address]       Ph: [phone] or [phone]       Fax: [phone]   RxID:   [id number] METOCLOPRAMIDE HCL 10 MG TABS (METOCLOPRAMIDE HCL) 1 by mouth three times a day ac prn  #90 x 3   Entered  and Authorized by:   Tresa Garter MD   Signed by:   Tresa Garter MD on 07/26/2010   Method used:   Electronically to        [facility]* (retail)       [address]       [place], [place]  [address]       Ph: [phone]       Fax: [phone]   RxID:   [id number]

## 2010-12-12 NOTE — Progress Notes (Signed)
Summary: Mediation cov/refill  Phone Note Call from Patient Call back at Home Phone [phone]   Caller: Patient Call For: Tresa Garter MD Summary of Call: Patient called stating that insurance will not cover BP medication Hyzaar. He states that they will cover fosinopril.  Patient is also requesting refill on Percocet 10/325 #120. Please advise Initial call taken by: Rock Nephew CMA,  December 24, 2007 9:58 AM  Follow-up for Phone Call        OK Fosinopr/hctz OK to refill Percocet Follow-up by: Tresa Garter MD,  December 24, 2007 5:19 PM  Additional Follow-up for Phone Call Additional follow up Details #1::        Pt informed original percocet rx shredded, new printed with correct fill after date Additional Follow-up by: Lamar Sprinkles,  December 25, 2007 8:31 AM    New/Updated Medications: PERCOCET 10-325 MG  TABS (OXYCODONE-ACETAMINOPHEN) 1po qid as needed for pain Please,  fill on or after 12/27/07 FOSINOPRIL SODIUM-HCTZ 20-12.5 MG  TABS (FOSINOPRIL SODIUM-HCTZ) 1 by mouth qam   Prescriptions: PERCOCET 10-325 MG  TABS (OXYCODONE-ACETAMINOPHEN) 1po qid as needed for pain Please,  fill on or after 12/27/07  #120 x 0   Entered by:   Lamar Sprinkles   Authorized by:   Tresa Garter MD   Signed by:   Lamar Sprinkles on 12/25/2007   Method used:   Print then Give to Patient   RxID:   [id number] PERCOCET 10-325 MG  TABS (OXYCODONE-ACETAMINOPHEN) 1po qid as needed for pain Please,  fill on or after 11/27/07  #120 x 0   Entered by:   Lamar Sprinkles   Authorized by:   Tresa Garter MD   Signed by:   Lamar Sprinkles on 12/25/2007   Method used:   Print then Give to Patient   RxID:   [id number] FOSINOPRIL SODIUM-HCTZ 20-12.5 MG  TABS (FOSINOPRIL SODIUM-HCTZ) 1 by mouth qam  #30 x 11   Entered and Authorized by:   Tresa Garter MD   Signed by:   Tresa Garter MD on 12/24/2007   Method used:   Electronically [facility] to ...       [id number]       [address]       [place], [place]  [address]       Ph: [phone] or [phone]       Fax: [phone]   RxID:   [id number]     Appended Document: Mediation cov/refill    Phone Note From Pharmacy   Caller: [facility] [address]  [facility] Summary of Call: On 12/26/07 patient Hyzarr was switched to fosinopril for insurance purpose. Pharmacy stated that this medication is temp unavailable. Is it ok for patient to have sample of the Hyzaa 100-25 until fosinopril becomes avail? Initial call taken by: Rock Nephew CMA,  January 02, 2008 11:03 AM

## 2010-12-12 NOTE — Assessment & Plan Note (Signed)
Summary: f/u appt/cd   Vital Signs:  Patient profile:   65 year old male Height:      70 inches Weight:      171 pounds BMI:     24.62 Temp:     98.1 degrees F oral Pulse rate:   80 / minute Pulse rhythm:   regular Resp:     16 per minute BP sitting:   130 / 80  (left arm) Cuff size:   regular  Vitals Entered By: Lanier Prude, Beverly Gust) (October 20, 2010 12:27 PM) CC: 3 mo f/u c/o increased anxiety since having GI surgery in July Is Patient Diabetic? No   Primary Care Provider:  Sonda Primes, MD  CC:  3 mo f/u c/o increased anxiety since having GI surgery in July.  History of Present Illness: The patient presents for a follow up of back pain, anxiety, depression and headaches.  C/o anxiety in ams following surgery  Current Medications (verified): 1)  Oxycodone Hcl 15 Mg Tabs (Oxycodone Hcl) .Marland Kitchen.. 1 By Mouth Qid As Needed Pain. Please,  Fill On or After   09/26/10         . Ok To Fill 1-2 Day Earlier When The Month Has 31 Days in It. 2)  Protonix 40 Mg  Tbec (Pantoprazole Sodium) .... Take 1 Tablet By Mouth Two Times A Day 3)  Gaviscon 80-14.2 Mg Chew (Alum Hydroxide-Mag Trisilicate) .... 2 By Mouth As Needed 4)  Vitamin D3 1000 Unit  Tabs (Cholecalciferol) .... 2  By Mouth Daily 5)  Promethazine Hcl 25 Mg  Tabs (Promethazine Hcl) .Marland Kitchen.. 1 By Mouth Two Times A Day As Needed Nausea 6)  Lomotil 2.5-0.025 Mg Tabs (Diphenoxylate-Atropine) .Marland Kitchen.. 1-2 By Mouth Two Times A Day As Needed Diarrhea 7)  Flonase 50 Mcg/act Susp (Fluticasone Propionate) .... 2 Sprays Each Nostril 8)  Bifera 28 Mg Tabs (Polysacch Fe Cmp-Fe Heme Poly) .... Once Daily 9)  Cozaar 50 Mg Tabs (Losartan Potassium) .... Take 1 Tab By Mouth Every Day 10)  Flonase 50 Mcg/act Susp (Fluticasone Propionate) .Marland Kitchen.. 1 Spr Each Nostr Qd As Needed 11)  Loratadine 10 Mg Tabs (Loratadine) .Marland Kitchen.. 1 By Mouth Once Daily As Needed Allergies 12)  Centrum Silver  Tabs (Multiple Vitamins-Minerals) .Marland Kitchen.. 1 By Mouth Qd 13)  Ferrous  Sulfate 325 (65 Fe) Mg Tabs (Ferrous Sulfate) .... One Tablet By Mouth Two Times A Day 14)  Flora-Q  Caps (Probiotic Product) .Marland Kitchen.. 1 By Mouth Bid 15)  Sucralfate 1 Gm Tabs (Sucralfate) .Marland Kitchen.. 1 By Mouth Bid 16)  Psyllium Fiber Packet .Marland Kitchen.. 1 Pack By Mouth Bid 17)  Metoclopramide Hcl 10 Mg Tabs (Metoclopramide Hcl) .Marland Kitchen.. 1 By Mouth Three Times A Day Ac Prn  Allergies (verified): 1)  ! Codeine Phosphate (Codeine Phosphate) 2)  Acetaminophen (Acetaminophen)  Past History:  Past Medical History: Last updated: 03/01/2009 Anemia-iron deficiency Hyperplastic colonic polyps, hx of 01/2000 Diverticulosis, colon Hypertension Low back pain, chronic Nephrolithiasis, hx of  B GERD Peptic ulcer disease with bleeding and GOO, H. pylori Ab negative, 08/2008 Benign prostatic hypertrophy Allergic rhinitis Degenerative disk disease Hyperlipidemia Atrial arrhythmia Renal cyst Duodenal stricture with gastric outlet obstruction Hemorrhoids Barretts Esophagus without dysplasia  Past Surgical History: Last updated: 08/01/2010 BACK SURGERY 02/2003 ON L5 Fusion LS 2009 Dr Jeral Fruit status post repair of hiatal hernia repair appendectomy BILROTH 2  2011 Dr Michaell Cowing  Social History: Last updated: 02/07/2010 Occupation: [employer] Married x 42 years Never Smoked-as a teenager Alcohol use-no Patient gets regular exercise.  Review of Systems  The patient denies weight loss, dyspnea on exertion, prolonged cough, abdominal pain, and depression.         anxiety  Physical Exam  General:  Well developed, well nourished, no acute distress. Eyes:  No corneal or conjunctival inflammation noted. EOMI. Perrla.  Nose:  External nasal examination shows no deformity or inflammation. Nasal mucosa are pink and moist without lesions or exudates. Mouth:  Oral mucosa and oropharynx without lesions or exudates.  Teeth in good repair. Lungs:  Clear throughout to auscultation. Heart:  Regular rate and rhythm; no  murmurs, rubs,  or bruits. Abdomen:  Soft, nontender and nondistended. No masses, hepatosplenomegaly or hernias noted. Normal bowel sounds. Scar present Msk:  Lumbar-sacral spine is tender to palpation over paraspinal muscles and painfull with the ROM  Extremities:  No clubbing, cyanosis, edema, or deformity noted with normal full range of motion of all joints.   Neurologic:  Alert and  oriented x4;  grossly normal neurologically. Skin:  Clear Psych:  Alert and cooperative. Normal mood and affect.   Impression & Recommendations:  Problem # 1:  LOW BACK PAIN (ICD-724.2) Assessment Unchanged  His updated medication list for this problem includes:    Oxycodone Hcl 15 Mg Tabs (Oxycodone hcl) .Marland Kitchen... 1 by mouth qid as needed pain. please,  fill on or after   11/26/10         . ok to fill 1-2 day earlier when the month has 31 days in it.  Problem # 2:  ANXIETY STATE, UNSPECIFIED (ICD-300.00) Assessment: New  His updated medication list for this problem includes:    Buspirone Hcl 15 Mg Tabs (Buspirone hcl) ..... Start with 1/2 tab two times a day then 1 by mouth two times a day for anxiety  Problem # 3:  HYPERTENSION (ICD-401.9) Assessment: Unchanged  His updated medication list for this problem includes:    Cozaar 50 Mg Tabs (Losartan potassium) .Marland Kitchen... Take 1 tab by mouth every day  Problem # 4:  DYSLIPIDEMIA (ICD-272.4) Assessment: Comment Only  Problem # 5:  ACUTE POSTHEMORRHAGIC ANEMIA (ICD-285.1) Assessment: Improved  His updated medication list for this problem includes:    Bifera 28 Mg Tabs (Polysacch fe cmp-fe heme poly) ..... Once daily    Ferrous Sulfate 325 (65 Fe) Mg Tabs (Ferrous sulfate) ..... One tablet by mouth two times a day  Complete Medication List: 1)  Oxycodone Hcl 15 Mg Tabs (Oxycodone hcl) .Marland Kitchen.. 1 by mouth qid as needed pain. please,  fill on or after   11/26/10         . ok to fill 1-2 day earlier when the month has 31 days in it. 2)  Protonix 40 Mg Tbec  (Pantoprazole sodium) .... Take 1 tablet by mouth two times a day 3)  Gaviscon 80-14.2 Mg Chew (Alum hydroxide-mag trisilicate) .... 2 by mouth as needed 4)  Vitamin D3 1000 Unit Tabs (Cholecalciferol) .... 2  by mouth daily 5)  Promethazine Hcl 25 Mg Tabs (Promethazine hcl) .Marland Kitchen.. 1 by mouth two times a day as needed nausea 6)  Lomotil 2.5-0.025 Mg Tabs (Diphenoxylate-atropine) .Marland Kitchen.. 1-2 by mouth two times a day as needed diarrhea 7)  Flonase 50 Mcg/act Susp (Fluticasone propionate) .... 2 sprays each nostril 8)  Bifera 28 Mg Tabs (Polysacch fe cmp-fe heme poly) .... Once daily 9)  Cozaar 50 Mg Tabs (Losartan potassium) .... Take 1 tab by mouth every day 10)  Flonase 50 Mcg/act Susp (Fluticasone propionate) .Marland Kitchen.. 1 spr  each nostr qd as needed 11)  Loratadine 10 Mg Tabs (Loratadine) .Marland Kitchen.. 1 by mouth once daily as needed allergies 12)  Centrum Silver Tabs (Multiple vitamins-minerals) .Marland Kitchen.. 1 by mouth qd 13)  Ferrous Sulfate 325 (65 Fe) Mg Tabs (Ferrous sulfate) .... One tablet by mouth two times a day 14)  Flora-q Caps (Probiotic product) .Marland Kitchen.. 1 by mouth bid 15)  Sucralfate 1 Gm Tabs (Sucralfate) .Marland Kitchen.. 1 by mouth bid 16)  Psyllium Fiber Packet  .Marland Kitchen.. 1 pack by mouth bid 17)  Metoclopramide Hcl 10 Mg Tabs (Metoclopramide hcl) .Marland Kitchen.. 1 by mouth three times a day ac prn 18)  Buspirone Hcl 15 Mg Tabs (Buspirone hcl) .... Start with 1/2 tab two times a day then 1 by mouth two times a day for anxiety  Patient Instructions: 1)  Please schedule a follow-up appointment in 3 months well w/labs. Prescriptions: OXYCODONE HCL 15 MG TABS (OXYCODONE HCL) 1 by mouth qid as needed pain. Please,  fill on or after   11/26/10         . OK to fill 1-2 day earlier when the month has 31 days in it.  #120 x 0   Entered and Authorized by:   Tresa Garter MD   Signed by:   Tresa Garter MD on 10/20/2010   Method used:   Print then Give to Patient   RxID:   [id number] OXYCODONE HCL 15 MG TABS (OXYCODONE HCL) 1  by mouth qid as needed pain. Please,  fill on or after   10/26/10         . OK to fill 1-2 day earlier when the month has 31 days in it.  #120 x 0   Entered and Authorized by:   Tresa Garter MD   Signed by:   Tresa Garter MD on 10/20/2010   Method used:   Print then Give to Patient   RxID:   [id number] BUSPIRONE HCL 15 MG TABS (BUSPIRONE HCL) Start with 1/2 tab two times a day then 1 by mouth two times a day for anxiety  #60 x 6   Entered and Authorized by:   Tresa Garter MD   Signed by:   Tresa Garter MD on 10/20/2010   Method used:   Print then Give to Patient   RxID:   [id number]    Orders Added: 1)  Est. Patient Level IV [53664]

## 2010-12-12 NOTE — Procedures (Signed)
Summary: EGD   EGD  Procedure date:  01/24/2009  Findings:      Location: [facility]    ENDOSCOPY PROCEDURE REPORT  PATIENT:  Cody Frazier, Cody Frazier  MR#:  [id number] BIRTHDATE:   08/05/1946   GENDER:   male  ENDOSCOPIST:   Venita Lick. Russella Dar, MD, [facility]    PROCEDURE DATE:  01/24/2009 PROCEDURE:  EGD with balloon dilatation ASA CLASS:   Class II INDICATIONS: duodenal stenosis with obstructive symptoms  MEDICATIONS:   Fentanyl 125 mcg IV, Versed 12.5 mg IV TOPICAL ANESTHETIC:   Cetacaine Spray  DESCRIPTION OF PROCEDURE:   After the risks benefits and alternatives of the procedure were thoroughly explained, informed consent was obtained.  The [id number] ([id number]) endoscope was introduced through the mouth and advanced to the second portion of the duodenum, limited by retained solid food in the body and fundus.   The instrument was slowly withdrawn as the mucosa was fully examined. <<PROCEDUREIMAGES>>            <<OLD IMAGES>>  An ulcer was found in the bulb of the duodenum. It was clean based and 1 cm in size.  Stenosis in the bulb of the duodenum with ersosions. It was benign appearing and rubbery. It was 9 mm in diameter. The 11.5 mm endoscope passed with minimal resistance. Balloon dilation with 13.5 mm and 15 mm both for 60 seconds with minimal heme with the 15 mm balloon. Miminally easier to pass the endoscope following dilation. Retained solid food was present in the body and fundus with was not removed.  Otherwise the examination was normal.  Retroflexed views revealed a hiatal hernia.  The scope was then withdrawn from the patient and the procedure completed.   COMPLICATIONS:   None  ENDOSCOPIC IMPRESSION:  1) 1 cm ulcer in the bulb of duodenum  2) 9 mm stenosis in the bulb of duodenum  3) Food, retained in the body and fundus  4) hiatal hernia RECOMMENDATIONS:  1) PPI bid  2) anti-reflux regimen  3) follow-up: GI clinic 1 month(s) 4) avoid all asa and NSAIDs     _______________________________ Venita Lick. Russella Dar, MD, [facility]    CC: Janeal Holmes MD This report was created from the original endoscopy report, which was reviewed and signed by the above listed endoscopist.     Appended Document: EGD REV 03-01-09 2:15 , patient aware

## 2010-12-12 NOTE — Progress Notes (Signed)
Summary: sick  Phone Note Call from Patient   Caller: Patient Details for Reason: Triage Summary of Call: Patient called with stomach pain and vomiting q . No hemoptasis. Patient states he was hospitalized last year 2009 for an ulcer. Pt. believes his ulcer may be what's bothering him. Initial call taken by: Beola Cord, CMA,  June 29, 2009 8:28 AM  Follow-up for Phone Call        Pt. advised to go to the ER per Dr. Posey Rea. Pt. verbalized his understanding. Follow-up by: Beola Cord, CMA,  June 29, 2009 8:28 AM

## 2010-12-12 NOTE — Assessment & Plan Note (Signed)
Summary: POST HOSP/[facility]   Vital Signs:  Patient profile:   65 year old male Height:      70 inches (177.80 cm) Weight:      174.50 pounds (79.32 kg) BMI:     25.13 O2 Sat:      97 % on Room air Temp:     98.4 degrees F (36.89 degrees C) oral Pulse rate:   91 / minute BP sitting:   130 / 88  (left arm) Cuff size:   regular  Vitals Entered By: Lucious Groves (April 26, 2010 9:39 AM)  O2 Flow:  Room air CC: Post hospital./kb Is Patient Diabetic? No Pain Assessment Patient in pain? no        Primary Care Provider:  Trinna Post Plotnikov,MD  CC:  Post hospital./kb.  History of Present Illness: The patient presents for a post-hospital visit for   DISCHARGE DIAGNOSES: 1. Duodenal ulcer with a stricture. 2. Anemia, status post blood transfusion. 3. Surgery for duodenal ulcer as outpatient. 4. Chronic back pain. 5. History of hypertension.   TESTS PERFORMED DURING THE HOSPITAL STAY: 1. CT of the head without contrast on April 15, 2010, showed no acute     abnormality. 2. The patient had upper gastrointestinal endoscopy, which showed     actively oozing visible vessel in chronic duodenal bulb ulcer     treated with epinephrine, cautery, and EndoClip placement. 3. Duodenal bulb stricture adjacent to the ulcer.   CONSULTS OBTAINED DURING THE HOSPITAL STAY: 1. Surgery consult. 2. GI consult.  Current Medications (verified): 1)  Oxycodone Hcl 15 Mg Tabs (Oxycodone Hcl) .Marland Kitchen.. 1 By Mouth Qid As Needed Pain. Please,  Fill On or After May 10th 2)  Protonix 40 Mg  Tbec (Pantoprazole Sodium) .... Take 1 Tablet By Mouth Two Times A Day 3)  Gaviscon 80-14.2 Mg Chew (Alum Hydroxide-Mag Trisilicate) .... 2 By Mouth As Needed 4)  Vitamin D3 1000 Unit  Tabs (Cholecalciferol) .... 2  By Mouth Daily 5)  Promethazine Hcl 25 Mg  Tabs (Promethazine Hcl) .Marland Kitchen.. 1 By Mouth Two Times A Day As Needed Nausea 6)  Lomotil 2.5-0.025 Mg Tabs (Diphenoxylate-Atropine) .Marland Kitchen.. 1-2 By Mouth Two Times A Day As Needed  Diarrhea 7)  Flonase 50 Mcg/act Susp (Fluticasone Propionate) .... 2 Sprays Each Nostril 8)  Bifera 28 Mg Tabs (Polysacch Fe Cmp-Fe Heme Poly) .... Once Daily 9)  Cozaar 50 Mg Tabs (Losartan Potassium) .... Take 1 Tab By Mouth Every Day 10)  Flonase 50 Mcg/act Susp (Fluticasone Propionate) .Marland Kitchen.. 1 Spr Each Nostr Qd As Needed 11)  Loratadine 10 Mg Tabs (Loratadine) .Marland Kitchen.. 1 By Mouth Once Daily As Needed Allergies 12)  Centrum Silver  Tabs (Multiple Vitamins-Minerals) .Marland Kitchen.. 1 By Mouth Qd 13)  Ferrous Sulfate 325 (65 Fe) Mg Tabs (Ferrous Sulfate) .Marland Kitchen.. 1 By Mouth Tid 14)  Flora-Q  Caps (Probiotic Product) .Marland Kitchen.. 1 By Mouth Bid 15)  Sucralfate 1 Gm Tabs (Sucralfate) .Marland Kitchen.. 1 By Mouth Bid 16)  Psyllium Fiber Packet .Marland Kitchen.. 1 Pack By Mouth Bid  Allergies (verified): 1)  ! Codeine Phosphate (Codeine Phosphate) 2)  Acetaminophen (Acetaminophen)  Past History:  Past Medical History: Last updated: 03/01/2009 Anemia-iron deficiency Hyperplastic colonic polyps, hx of 01/2000 Diverticulosis, colon Hypertension Low back pain, chronic Nephrolithiasis, hx of  B GERD Peptic ulcer disease with bleeding and GOO, H. pylori Ab negative, 08/2008 Benign prostatic hypertrophy Allergic rhinitis Degenerative disk disease Hyperlipidemia Atrial arrhythmia Renal cyst Duodenal stricture with gastric outlet obstruction Hemorrhoids Barretts Esophagus without dysplasia  Past Surgical History: Last updated: 10/11/2008 BACK SURGERY 02/2003 ON L5 Fusion LS 2009 Dr Jeral Fruit status post repair of hiatal hernia repair appendectomy  Social History: Last updated: 02/07/2010 Occupation: [employer] Married x 42 years Never Smoked-as a teenager Alcohol use-no Patient gets regular exercise.  Review of Systems       The patient complains of difficulty walking and unusual weight change.  The patient denies anorexia, fever, weight loss, weight gain, vision loss, decreased hearing, hoarseness, chest pain, syncope, dyspnea on  exertion, peripheral edema, prolonged cough, headaches, hemoptysis, abdominal pain, melena, hematochezia, severe indigestion/heartburn, hematuria, incontinence, genital sores, muscle weakness, suspicious skin lesions, transient blindness, depression, abnormal bleeding, enlarged lymph nodes, angioedema, and testicular masses.         LBP Tired  Physical Exam  General:  Well developed, well nourished, no acute distress. Head:  Normocephalic and atraumatic without obvious abnormalities. No apparent alopecia or balding. Eyes:  No corneal or conjunctival inflammation noted. EOMI. Perrla.  Ears:  External ear exam shows no significant lesions or deformities.  Otoscopic examination reveals clear canals, tympanic membranes are intact bilaterally without bulging, retraction, inflammation or discharge. Hearing is grossly normal bilaterally. Nose:  External nasal examination shows no deformity or inflammation. Nasal mucosa are pink and moist without lesions or exudates. Mouth:  Oral mucosa and oropharynx without lesions or exudates.  Teeth in good repair. Neck:  No deformities, masses, or tenderness noted. Chest Wall:  No deformities, masses, tenderness or gynecomastia noted. Lungs:  Clear throughout to auscultation. Heart:  Regular rate and rhythm; no murmurs, rubs,  or bruits. Abdomen:  Soft, nontender and nondistended. No masses, hepatosplenomegaly or hernias noted. Normal bowel sounds. Msk:  Lumbar-sacral spine is tender to palpation over paraspinal muscles and painfull with the ROM  Pulses:  R and L carotid,radial,femoral,dorsalis pedis and posterior tibial pulses are full and equal bilaterally Extremities:  No clubbing, cyanosis, edema, or deformity noted with normal full range of motion of all joints.   Neurologic:  Alert and  oriented x4;  grossly normal neurologically. Skin:  Clear Cervical Nodes:  No lymphadenopathy noted Inguinal Nodes:  No significant adenopathy Psych:  Alert and  cooperative. Normal mood and affect.   Impression & Recommendations:  Problem # 1:  ULCER-DUODENAL (ICD-532.9)  recurrent Assessment Deteriorated  His updated medication list for this problem includes:    Protonix 40 Mg Tbec (Pantoprazole sodium) .Marland Kitchen... Take 1 tablet by mouth two times a day    Gaviscon 80-14.2 Mg Chew (Alum hydroxide-mag trisilicate) .Marland Kitchen... 2 by mouth as needed    Sucralfate 1 Gm Tabs (Sucralfate) .Marland Kitchen... 1 by mouth bid Bilroth 2 is pending. Info given. Discussed reasons for surgery and implications Hosp records reviewed  Problem # 2:  FATIGUE (ICD-780.79) Assessment: Deteriorated  Problem # 3:  HYPERTENSION (ICD-401.9) Assessment: Comment Only  His updated medication list for this problem includes:    Cozaar 50 Mg Tabs (Losartan potassium) .Marland Kitchen... Take 1 tab by mouth every day  BP today: 130/88 Prior BP: 110/70 (02/07/2010)  Labs Reviewed: K+: 4.3 (02/01/2010) Creat: : 1.3 (02/01/2010)   Chol: 227 (02/01/2010)   HDL: 44.90 (02/01/2010)   LDL: 59 (08/02/2008)   TG: 150.0 (02/01/2010)  Problem # 4:  LOW BACK PAIN (ICD-724.2) Assessment: Unchanged  His updated medication list for this problem includes:    Oxycodone Hcl 15 Mg Tabs (Oxycodone hcl) .Marland Kitchen... 1 by mouth qid as needed pain. please,  fill on or after   06/26/10         .  ok to fill 1-2 day earlier when the month has 31 days in it.  Problem # 5:  DYSLIPIDEMIA (ICD-272.4) Assessment: Comment Only  Labs Reviewed: SGOT: 22 (02/01/2010)   SGPT: 19 (02/01/2010)   HDL:44.90 (02/01/2010), 37.8 (08/02/2008)  LDL:59 (08/02/2008), 91 (01/16/2008)  Chol:227 (02/01/2010), 127 (08/02/2008)  Trig:150.0 (02/01/2010), 151 (08/02/2008)  Problem # 6:  ANEMIA-IRON DEFICIENCY (ICD-280.9) Assessment: Comment Only  His updated medication list for this problem includes:    Bifera 28 Mg Tabs (Polysacch fe cmp-fe heme poly) ..... Once daily    Ferrous Sulfate 325 (65 Fe) Mg Tabs (Ferrous sulfate) .Marland Kitchen... 1 by mouth  tid  Complete Medication List: 1)  Oxycodone Hcl 15 Mg Tabs (Oxycodone hcl) .Marland Kitchen.. 1 by mouth qid as needed pain. please,  fill on or after   06/26/10         . ok to fill 1-2 day earlier when the month has 31 days in it. 2)  Protonix 40 Mg Tbec (Pantoprazole sodium) .... Take 1 tablet by mouth two times a day 3)  Gaviscon 80-14.2 Mg Chew (Alum hydroxide-mag trisilicate) .... 2 by mouth as needed 4)  Vitamin D3 1000 Unit Tabs (Cholecalciferol) .... 2  by mouth daily 5)  Promethazine Hcl 25 Mg Tabs (Promethazine hcl) .Marland Kitchen.. 1 by mouth two times a day as needed nausea 6)  Lomotil 2.5-0.025 Mg Tabs (Diphenoxylate-atropine) .Marland Kitchen.. 1-2 by mouth two times a day as needed diarrhea 7)  Flonase 50 Mcg/act Susp (Fluticasone propionate) .... 2 sprays each nostril 8)  Bifera 28 Mg Tabs (Polysacch fe cmp-fe heme poly) .... Once daily 9)  Cozaar 50 Mg Tabs (Losartan potassium) .... Take 1 tab by mouth every day 10)  Flonase 50 Mcg/act Susp (Fluticasone propionate) .Marland Kitchen.. 1 spr each nostr qd as needed 11)  Loratadine 10 Mg Tabs (Loratadine) .Marland Kitchen.. 1 by mouth once daily as needed allergies 12)  Centrum Silver Tabs (Multiple vitamins-minerals) .Marland Kitchen.. 1 by mouth qd 13)  Ferrous Sulfate 325 (65 Fe) Mg Tabs (Ferrous sulfate) .Marland Kitchen.. 1 by mouth tid 14)  Flora-q Caps (Probiotic product) .Marland Kitchen.. 1 by mouth bid 15)  Sucralfate 1 Gm Tabs (Sucralfate) .Marland Kitchen.. 1 by mouth bid 16)  Psyllium Fiber Packet  .Marland Kitchen.. 1 pack by mouth bid  Patient Instructions: 1)  Please schedule a follow-up appointment in 3 months. Prescriptions: OXYCODONE HCL 15 MG TABS (OXYCODONE HCL) 1 by mouth qid as needed pain. Please,  fill on or after   06/26/10         . OK to fill 1-2 day earlier when the month has 31 days in it.  #120 x 0   Entered and Authorized by:   Tresa Garter MD   Signed by:   Tresa Garter MD on 04/26/2010   Method used:   Print then Give to Patient   RxID:   [id number] OXYCODONE HCL 15 MG TABS (OXYCODONE HCL) 1 by mouth qid as  needed pain. Please,  fill on or after   05/26/10         . OK to fill 1-2 day earlier when the month has 31 days in it.  #120 x 0   Entered and Authorized by:   Tresa Garter MD   Signed by:   Tresa Garter MD on 04/26/2010   Method used:   Print then Give to Patient   RxID:   [id number] OXYCODONE HCL 15 MG TABS (OXYCODONE HCL) 1 by mouth qid as needed pain.  #120 x 0  Entered and Authorized by:   Tresa Garter MD   Signed by:   Tresa Garter MD on 04/26/2010   Method used:   Print then Give to Patient   RxID:   [id number]

## 2010-12-12 NOTE — Letter (Signed)
Summary: Colonoscopy Letter  [facility]  [address] [place], [place] [address]   Phone: [phone]  Fax: [phone]      March 10, 2010 MRN: [id number]   Cody Frazier [address] [place], [place]  [address]   Dear Mr. Marcinek,   According to your medical record, it is time for you to schedule a Colonoscopy. The American Cancer Society recommends this procedure as a method to detect early colon cancer. Patients with a family history of colon cancer, or a personal history of colon polyps or inflammatory bowel disease are at increased risk.  This letter has beeen generated based on the recommendations made at the time of your procedure. If you feel that in your particular situation this may no longer apply, please contact our office.  Please call our office at [phone] to schedule this appointment or to update your records at your earliest convenience.  Thank you for cooperating with [place] to provide you with the very best care possible.   Sincerely,  Judie Petit T. Russella Dar, M.D.  [facility] [phone]

## 2010-12-12 NOTE — Assessment & Plan Note (Signed)
Summary: 3 MTH FU-STC  $50   Vital Signs:  Patient Profile:   65 Years Old Male Weight:      194 pounds Temp:     97.7 degrees F oral Pulse rate:   77 / minute BP sitting:   152 / 92  (left arm)  Vitals Entered By: Tora Perches (May 05, 2008 1:48 PM)                 Chief Complaint:  Multiple medical problems or concerns.  History of Present Illness: The patient presents for a follow up of back pain, anxiety, depression and headaches. F/u on recent UTI. C/o Prevacid is too $$$.     Current Allergies (reviewed today): ! CODEINE PHOSPHATE (CODEINE PHOSPHATE)  Past Medical History:    Reviewed history from 10/22/2007 and no changes required:       Anemia-iron deficiency       Colonic polyps, hx of       Diverticulosis, colon       Hypertension       Low back pain, chronic       Nephrolithiasis, hx of  B       GERD       Peptic ulcer disease       Benign prostatic hypertrophy       Allergic rhinitis    Family History:    Reviewed history from 09/11/2007 and no changes required:       Family History Hypertension  Social History:    Reviewed history from 09/11/2007 and no changes required:       Occupation: [employer]       Married       Never Smoked       Alcohol use-no       Regular exercise-no    Review of Systems  The patient denies chest pain.         no urinary sx's   Physical Exam  General:     Well-developed,well-nourished,in no acute distress; alert,appropriate and cooperative throughout examination Eyes:     No corneal or conjunctival inflammation noted. EOMI. Perrla. Funduscopic exam benign, without hemorrhages, exudates or papilledema. Vision grossly normal. Ears:     External ear exam shows no significant lesions or deformities.  Otoscopic examination reveals clear canals, tympanic membranes are intact bilaterally without bulging, retraction, inflammation or discharge. Hearing is grossly normal bilaterally. Nose:     External nasal  examination shows no deformity or inflammation. Nasal mucosa are pink and moist without lesions or exudates. Mouth:     Oral mucosa and oropharynx without lesions or exudates.  Teeth in good repair. Neck:     No deformities, masses, or tenderness noted. Lungs:     Normal respiratory effort, chest expands symmetrically. Lungs are clear to auscultation, no crackles or wheezes. Heart:     Normal rate and regular rhythm. S1 and S2 normal without gallop, murmur, click, rub or other extra sounds. Abdomen:     Bowel sounds positive,abdomen soft and non-tender without masses, organomegaly or hernias noted. Msk:     Lumbar-sacral spine is tender to palpation over paraspinal muscles and painfull with the ROM  Extremities:     No clubbing, cyanosis, edema, or deformity noted with normal full range of motion of all joints.   Neurologic:     No cranial nerve deficits noted. Station and gait are normal. Plantar reflexes are down-going bilaterally. DTRs are symmetrical throughout. Sensory, motor and coordinative functions appear intact. Skin:  Intact without suspicious lesions or rashes Psych:     Cognition and judgment appear intact. Alert and cooperative with normal attention span and concentration. No apparent delusions, illusions, hallucinations    Impression & Recommendations:  Problem # 1:  URINARY TRACT INFECTION (UTI) (ICD-599.0) Assessment: New  His updated medication list for this problem includes:    Cipro 250 Mg Tabs (Ciprofloxacin hcl) .Marland Kitchen... 1 two times a day  Encouraged to push clear liquids, get enough rest, and take acetaminophen as needed. To be seen in 10 days if no improvement, sooner if worse.   Problem # 2:  HYPERTENSION (ICD-401.9) Assessment: Deteriorated  His updated medication list for this problem includes:    Fosinopril Sodium-hctz 20-12.5 Mg Tabs (Fosinopril sodium-hctz) .Marland Kitchen... 1 by mouth qam  BP today: 152/92 Prior BP: 130/86 (01/22/2008)  Labs Reviewed:  Creat: 1.1 (04/30/2008) Chol: 165 (01/16/2008)   HDL: 39.8 (01/16/2008)   LDL: 91 (01/16/2008)   TG: 173 (01/16/2008)   Problem # 3:  ANEMIA-IRON DEFICIENCY (ICD-280.9) Assessment: Improved  His updated medication list for this problem includes:    Chromagen Forte 50-101-1 Mg Tabs (Feasp-fefum -suc-c-thre-b12-fa) ..... Once daily   Problem # 4:  LOW BACK PAIN (ICD-724.2) Assessment: Unchanged Going away in Jul (Rx for 7/10) His updated medication list for this problem includes:    Percocet 10-325 Mg Tabs (Oxycodone-acetaminophen) .Marland Kitchen... 1po qid as needed for pain please,  fill on or after 8/110/9   Complete Medication List: 1)  Ranitidine Hcl 150 Mg Caps (Ranitidine hcl) .... Once daily 2)  Flomax 0.4 Mg Cp24 (Tamsulosin hcl) .... Once daily 3)  Chromagen Forte 50-101-1 Mg Tabs (Feasp-fefum -suc-c-thre-b12-fa) .... Once daily 4)  Percocet 10-325 Mg Tabs (Oxycodone-acetaminophen) .Marland Kitchen.. 1po qid as needed for pain please,  fill on or after 8/110/9 5)  Simvastatin 80 Mg Tabs (Simvastatin) .Marland Kitchen.. 1 once daily po 6)  Flonase 50 Mcg/act Susp (Fluticasone propionate) .... 2 sprays each day 7)  Allegra 180 Mg Tabs (Fexofenadine hcl) .Marland Kitchen.. 1 by mouth once daily as needed allergies 8)  Fosinopril Sodium-hctz 20-12.5 Mg Tabs (Fosinopril sodium-hctz) .Marland Kitchen.. 1 by mouth qam 9)  Vitamin D3 1000 Unit Tabs (Cholecalciferol) .Marland Kitchen.. 1 qd 10)  Cipro 250 Mg Tabs (Ciprofloxacin hcl) .Marland Kitchen.. 1 two times a day 11)  Protonix 40 Mg Tbec (Pantoprazole sodium) .... Once daily   Patient Instructions: 1)  Please schedule a follow-up appointment in 3 months.   Prescriptions: PERCOCET 10-325 MG  TABS (OXYCODONE-ACETAMINOPHEN) 1po qid as needed for pain Please,  fill on or after 8/110/9  #120 x 0   Entered and Authorized by:   Tresa Garter MD   Signed by:   Tresa Garter MD on 05/05/2008   Method used:   Print then Give to Patient   RxID:   [id number] PERCOCET 10-325 MG  TABS  (OXYCODONE-ACETAMINOPHEN) 1po qid as needed for pain Please,  fill on or after 7/110/9  #120 x 0   Entered and Authorized by:   Tresa Garter MD   Signed by:   Tresa Garter MD on 05/05/2008   Method used:   Print then Give to Patient   RxID:   [id number] PROTONIX 40 MG  TBEC (PANTOPRAZOLE SODIUM) once daily  #90 x 3   Entered and Authorized by:   Tresa Garter MD   Signed by:   Tresa Garter MD on 05/05/2008   Method used:   Print then Give to Patient   RxID:   [id number]  ]

## 2010-12-12 NOTE — Assessment & Plan Note (Signed)
Summary: 3 MO ROV /NWS   Vital Signs:  Patient profile:   65 year old male Height:      70 inches Weight:      166 pounds BMI:     23.90 Temp:     98.3 degrees F oral Pulse rate:   76 / minute Pulse rhythm:   regular Resp:     16 per minute BP sitting:   140 / 98  (left arm) Cuff size:   regular  Vitals Entered By: Lanier Prude, CMA(AAMA) (August 01, 2010 8:07 AM) CC: 3 mo f/u Is Patient Diabetic? No   Primary Care Provider:  Sonda Primes, MD  CC:  3 mo f/u.  History of Present Illness: The patient presents for a follow up of low  back pain, anxiety, GERD The patient presents for a post-hospital visit for Bilroth 2   Preventive Screening-Counseling & Management  Alcohol-Tobacco     Smoking Status: never     Passive Smoke Exposure: no  Current Medications (verified): 1)  Oxycodone Hcl 15 Mg Tabs (Oxycodone Hcl) .Marland Kitchen.. 1 By Mouth Qid As Needed Pain. Please,  Fill On or After   07/27/10         . Ok To Fill 1-2 Day Earlier When The Month Has 31 Days in It. 2)  Protonix 40 Mg  Tbec (Pantoprazole Sodium) .... Take 1 Tablet By Mouth Two Times A Day 3)  Gaviscon 80-14.2 Mg Chew (Alum Hydroxide-Mag Trisilicate) .... 2 By Mouth As Needed 4)  Vitamin D3 1000 Unit  Tabs (Cholecalciferol) .... 2  By Mouth Daily 5)  Promethazine Hcl 25 Mg  Tabs (Promethazine Hcl) .Marland Kitchen.. 1 By Mouth Two Times A Day As Needed Nausea 6)  Lomotil 2.5-0.025 Mg Tabs (Diphenoxylate-Atropine) .Marland Kitchen.. 1-2 By Mouth Two Times A Day As Needed Diarrhea 7)  Flonase 50 Mcg/act Susp (Fluticasone Propionate) .... 2 Sprays Each Nostril 8)  Bifera 28 Mg Tabs (Polysacch Fe Cmp-Fe Heme Poly) .... Once Daily 9)  Cozaar 50 Mg Tabs (Losartan Potassium) .... Take 1 Tab By Mouth Every Day 10)  Flonase 50 Mcg/act Susp (Fluticasone Propionate) .Marland Kitchen.. 1 Spr Each Nostr Qd As Needed 11)  Loratadine 10 Mg Tabs (Loratadine) .Marland Kitchen.. 1 By Mouth Once Daily As Needed Allergies 12)  Centrum Silver  Tabs (Multiple Vitamins-Minerals) .Marland Kitchen.. 1 By  Mouth Qd 13)  Ferrous Sulfate 325 (65 Fe) Mg Tabs (Ferrous Sulfate) .... One Tablet By Mouth Two Times A Day 14)  Flora-Q  Caps (Probiotic Product) .Marland Kitchen.. 1 By Mouth Bid 15)  Sucralfate 1 Gm Tabs (Sucralfate) .Marland Kitchen.. 1 By Mouth Bid 16)  Psyllium Fiber Packet .Marland Kitchen.. 1 Pack By Mouth Bid 17)  Metoclopramide Hcl 10 Mg Tabs (Metoclopramide Hcl) .Marland Kitchen.. 1 By Mouth Three Times A Day Ac Prn  Allergies (verified): 1)  ! Codeine Phosphate (Codeine Phosphate) 2)  Acetaminophen (Acetaminophen)  Past History:  Past Medical History: Last updated: 03/01/2009 Anemia-iron deficiency Hyperplastic colonic polyps, hx of 01/2000 Diverticulosis, colon Hypertension Low back pain, chronic Nephrolithiasis, hx of  B GERD Peptic ulcer disease with bleeding and GOO, H. pylori Ab negative, 08/2008 Benign prostatic hypertrophy Allergic rhinitis Degenerative disk disease Hyperlipidemia Atrial arrhythmia Renal cyst Duodenal stricture with gastric outlet obstruction Hemorrhoids Barretts Esophagus without dysplasia  Social History: Last updated: 02/07/2010 Occupation: [employer] Married x 42 years Never Smoked-as a teenager Alcohol use-no Patient gets regular exercise.  Past Surgical History: BACK SURGERY 02/2003 ON L5 Fusion LS 2009 Dr Jeral Fruit status post repair of hiatal hernia repair appendectomy  BILROTH 2  2011 Dr Michaell Cowing  Review of Systems  The patient denies chest pain, abdominal pain, and melena.    Physical Exam  General:  Well developed, well nourished, no acute distress. Nose:  External nasal examination shows no deformity or inflammation. Nasal mucosa are pink and moist without lesions or exudates. Mouth:  Oral mucosa and oropharynx without lesions or exudates.  Teeth in good repair. Neck:  No deformities, masses, or tenderness noted. Lungs:  Clear throughout to auscultation. Heart:  Regular rate and rhythm; no murmurs, rubs,  or bruits. Abdomen:  Soft, nontender and nondistended. No masses,  hepatosplenomegaly or hernias noted. Normal bowel sounds. Scar present Msk:  Lumbar-sacral spine is tender to palpation over paraspinal muscles and painfull with the ROM  Neurologic:  Alert and  oriented x4;  grossly normal neurologically. Skin:  Clear Psych:  Alert and cooperative. Normal mood and affect.   Impression & Recommendations:  Problem # 1:  HYPERTENSION (ICD-401.9) Assessment Improved  His updated medication list for this problem includes:    Cozaar 50 Mg Tabs (Losartan potassium) .Marland Kitchen... Take 1 tab by mouth every day - on hold  Problem # 2:  LOW BACK PAIN (ICD-724.2) Assessment: Unchanged  His updated medication list for this problem includes:    Oxycodone Hcl 15 Mg Tabs (Oxycodone hcl) .Marland Kitchen... 1 by mouth qid as needed pain. please,  fill on or after   08/26/10         . ok to fill 1-2 day earlier when the month has 31 days in it.  Orders: TLB-B12, Serum-Total ONLY ([address]-U04) TLB-BMP (Basic Metabolic Panel-BMET) (80048-METABOL) TLB-CBC Platelet - w/Differential (85025-CBCD) TLB-IBC Pnl (Iron/FE;Transferrin) (83550-IBC)  Problem # 3:  GERD (ICD-530.81) Assessment: Improved  His updated medication list for this problem includes:    Protonix 40 Mg Tbec (Pantoprazole sodium) .Marland Kitchen... Take 1 tablet by mouth two times a day    Gaviscon 80-14.2 Mg Chew (Alum hydroxide-mag trisilicate) .Marland Kitchen... 2 by mouth as needed    Sucralfate 1 Gm Tabs (Sucralfate) .Marland Kitchen... 1 by mouth bid  Orders: TLB-B12, Serum-Total ONLY (54098-J19) TLB-BMP (Basic Metabolic Panel-BMET) (80048-METABOL) TLB-CBC Platelet - w/Differential (85025-CBCD) TLB-IBC Pnl (Iron/FE;Transferrin) (83550-IBC)  Problem # 4:  ANEMIA-IRON DEFICIENCY (ICD-280.9) Assessment: Improved  His updated medication list for this problem includes:    Bifera 28 Mg Tabs (Polysacch fe cmp-fe heme poly) ..... Once daily    Ferrous Sulfate 325 (65 Fe) Mg Tabs (Ferrous sulfate) ..... One tablet by mouth two times a day  Complete Medication  List: 1)  Oxycodone Hcl 15 Mg Tabs (Oxycodone hcl) .Marland Kitchen.. 1 by mouth qid as needed pain. please,  fill on or after   08/26/10         . ok to fill 1-2 day earlier when the month has 31 days in it. 2)  Protonix 40 Mg Tbec (Pantoprazole sodium) .... Take 1 tablet by mouth two times a day 3)  Gaviscon 80-14.2 Mg Chew (Alum hydroxide-mag trisilicate) .... 2 by mouth as needed 4)  Vitamin D3 1000 Unit Tabs (Cholecalciferol) .... 2  by mouth daily 5)  Promethazine Hcl 25 Mg Tabs (Promethazine hcl) .Marland Kitchen.. 1 by mouth two times a day as needed nausea 6)  Lomotil 2.5-0.025 Mg Tabs (Diphenoxylate-atropine) .Marland Kitchen.. 1-2 by mouth two times a day as needed diarrhea 7)  Flonase 50 Mcg/act Susp (Fluticasone propionate) .... 2 sprays each nostril 8)  Bifera 28 Mg Tabs (Polysacch fe cmp-fe heme poly) .... Once daily 9)  Cozaar 50 Mg Tabs (  Losartan potassium) .... Take 1 tab by mouth every day 10)  Flonase 50 Mcg/act Susp (Fluticasone propionate) .Marland Kitchen.. 1 spr each nostr qd as needed 11)  Loratadine 10 Mg Tabs (Loratadine) .Marland Kitchen.. 1 by mouth once daily as needed allergies 12)  Centrum Silver Tabs (Multiple vitamins-minerals) .Marland Kitchen.. 1 by mouth qd 13)  Ferrous Sulfate 325 (65 Fe) Mg Tabs (Ferrous sulfate) .... One tablet by mouth two times a day 14)  Flora-q Caps (Probiotic product) .Marland Kitchen.. 1 by mouth bid 15)  Sucralfate 1 Gm Tabs (Sucralfate) .Marland Kitchen.. 1 by mouth bid 16)  Psyllium Fiber Packet  .Marland Kitchen.. 1 pack by mouth bid 17)  Metoclopramide Hcl 10 Mg Tabs (Metoclopramide hcl) .Marland Kitchen.. 1 by mouth three times a day ac prn  Other Orders: Admin 1st Vaccine (10932) Flu Vaccine 46yrs + (35573)  Patient Instructions: 1)  Please schedule a follow-up appointment in 3 months. Prescriptions: OXYCODONE HCL 15 MG TABS (OXYCODONE HCL) 1 by mouth qid as needed pain. Please,  fill on or after   08/26/10         . OK to fill 1-2 day earlier when the month has 31 days in it.  #120 x 0   Entered and Authorized by:   Tresa Garter MD   Signed by:    Tresa Garter MD on 08/01/2010   Method used:   Print then Give to Patient   RxID:   [id number]    .lbflu   Flu Vaccine Consent Questions     Do you have a history of severe allergic reactions to this vaccine? no    Any prior history of allergic reactions to egg and/or gelatin? no    Do you have a sensitivity to the preservative Thimersol? no    Do you have a past history of Guillan-Barre Syndrome? no    Do you currently have an acute febrile illness? no    Have you ever had a severe reaction to latex? no    Vaccine information given and explained to patient? yes    Are you currently pregnant? no    Lot Number:[id number]   Exp Date:05/12/2011   Site Given  Left Deltoid IM Lanier Prude, [facility])  August 01, 2010 8:45 AM

## 2010-12-12 NOTE — Letter (Signed)
Summary: [facility]   Imported By: Lester [place] 05/16/2010 09:32:47  _____________________________________________________________________  External Attachment:    Type:   Image     Comment:   External Document

## 2010-12-12 NOTE — Assessment & Plan Note (Signed)
Medications Added HYZAAR 100-25 MG TABS (LOSARTAN POTASSIUM-HCTZ) qd PREVACID 30 MG CPDR (LANSOPRAZOLE) qd TUSSIONEX PENNKINETIC ER 8-10 MG/5ML LQCR (CHLORPHENIRAMINE-HYDROCODONE) Apply 1 teaspoon by mouth twice a day VITAMIN D3 1000 UNIT  TABS (CHOLECALCIFEROL) 1 qd        Vital Signs:  Patient Profile:   65 Years Old Male Weight:      171 pounds Temp:     97.3 degrees F oral Pulse rate:   82 / minute BP sitting:   152 / 95  (left arm)  Vitals Entered By: Tora Perches (September 11, 2007 9:07 AM)                 Chief Complaint:  Multiple medical problems or concerns.  History of Present Illness: The patient presents for a follow up of hypertension,LBP, nausea   Current Allergies: ! CODEINE PHOSPHATE (CODEINE PHOSPHATE)  Past Medical History:    Anemia-iron deficiency    Colonic polyps, hx of    Diverticulosis, colon    Hypertension    Low back pain, chronic    Nephrolithiasis, hx of    GERD    Peptic ulcer disease    Benign prostatic hypertrophy   Family History:    Family History Hypertension  Social History:    Occupation: [employer]    Married    Never Smoked    Alcohol use-no    Regular exercise-no   Risk Factors:  Tobacco use:  never Alcohol use:  no Exercise:  no    Physical Exam  General:     Well-developed,well-nourished,in no acute distress; alert,appropriate and cooperative throughout examination Head:     Normocephalic and atraumatic without obvious abnormalities. No apparent alopecia or balding. Eyes:     No corneal or conjunctival inflammation noted. EOMI. Perrla. Funduscopic exam benign, without hemorrhages, exudates or papilledema. Vision grossly normal. Mouth:     Oral mucosa and oropharynx without lesions or exudates.  Teeth in good repair. Neck:     No deformities, masses, or tenderness noted. Lungs:     Normal respiratory effort, chest expands symmetrically. Lungs are clear to auscultation, no crackles or wheezes.  Heart:     Normal rate and regular rhythm. S1 and S2 normal without gallop, murmur, click, rub or other extra sounds. Abdomen:     Bowel sounds positive,abdomen soft and non-tender without masses, organomegaly or hernias noted. Msk:     Lumbar-sacral sine is tender to palpation over paraspinal muscles and painfull with the ROM  Neurologic:     No cranial nerve deficits noted. Station and gait are normal. Plantar reflexes are down-going bilaterally. DTRs are symmetrical throughout. Sensory, motor and coordinative functions appear intact. Skin:     Intact without suspicious lesions or rashes Psych:     Cognition and judgment appear intact. Alert and cooperative with normal attention span and concentration. No apparent delusions, illusions, hallucinations    Impression & Recommendations:  Problem # 1:  GERD (ICD-530.81) Assessment: Unchanged  The following medications were removed from the medication list:    Prevacid Solutab 30 Mg Tbdp (Lansoprazole) ..... Once daily  His updated medication list for this problem includes:    Prevacid 30 Mg Cpdr (Lansoprazole) ..... Qd    Ranitidine Hcl 150 Mg Caps (Ranitidine hcl) ..... Once daily   Problem # 2:  NEPHROLITHIASIS, HX OF (ICD-V13.01) Assessment: Improved  Problem # 3:  LOW BACK PAIN (ICD-724.2) Assessment: Unchanged  His updated medication list for this problem includes:    Percocet 10-650 Mg  Tabs (Oxycodone-acetaminophen) ..... Once daily   Problem # 4:  HYPERTENSION (ICD-401.9) Assessment: Unchanged  The following medications were removed from the medication list:    Diovan Hct 160-12.5 Mg Tabs (Valsartan-hydrochlorothiazide) ..... Once daily  His updated medication list for this problem includes:    Hyzaar 100-25 Mg Tabs (Losartan potassium-hctz) ..... Qd   Complete Medication List: 1)  Simvastatin 40 Mg Tabs (Simvastatin) .... Once daily 2)  Hyzaar 100-25 Mg Tabs (Losartan potassium-hctz) .... Qd 3)  Prevacid 30  Mg Cpdr (Lansoprazole) .... Qd 4)  Ranitidine Hcl 150 Mg Caps (Ranitidine hcl) .... Once daily 5)  Allegra-d 12 Hour 60-120 Mg Tb12 (Fexofenadine-pseudoephedrine) .... Once daily 6)  Nasacort Aq 55 Mcg/act Aers (Triamcinolone acetonide(nasal)) .... Once daily 7)  Flomax 0.4 Mg Cp24 (Tamsulosin hcl) .... Once daily 8)  Percocet 10-650 Mg Tabs (Oxycodone-acetaminophen) .... Once daily 9)  Chromagen Forte 50-101-1 Mg Tabs (Feasp-fefum -suc-c-thre-b12-fa) .... Once daily 10)  Vitamin D3 1000 Unit Tabs (Cholecalciferol) .Marland Kitchen.. 1 qd   Patient Instructions: 1)  Please schedule a follow-up appointment in 3 months.    ]

## 2010-12-12 NOTE — Progress Notes (Signed)
Summary: Medication   Phone Note Call from Patient Call back at [phone]   Caller: Patient Call For: Dr. Russella Dar Reason for Call: Refill Medication Summary of Call: pt. is needing a refill on his Sucralfate....if he needs to continue taking it  [facility] Initial call taken by: Karna Christmas,  May 18, 2010 10:13 AM  Follow-up for Phone Call        Rx was sent to pts pharmacy and pt notified.  Follow-up by: Christie Nottingham CMA Duncan Dull),  May 18, 2010 10:21 AM    Prescriptions: SUCRALFATE 1 GM TABS (SUCRALFATE) 1 by mouth bid  #60 x 1   Entered by:   Christie Nottingham CMA (AAMA)   Authorized by:   Meryl Dare MD [facility]   Signed by:   Christie Nottingham CMA (AAMA) on 05/18/2010   Method used:   Electronically to        [id number]       [address]       [place], [place]  [address]       Ph: [phone] or [phone]       Fax: [phone]   RxID:   [id number]

## 2010-12-12 NOTE — Progress Notes (Signed)
Summary: UTI??  Phone Note Call from Patient Call back at [facility] Phone [phone]   Summary of Call: Pt has an apt next wednesday. Patient is requesting a u/a. C/o urinary burning.  Initial call taken by: Lamar Sprinkles,  April 30, 2008 9:04 AM  Follow-up for Phone Call        OK for U/A 595.0 Follow-up by: Jacques Navy MD,  April 30, 2008 1:14 PM  Additional Follow-up for Phone Call Additional follow up Details #1::        Lf mess for pt to come into lab. Additional Follow-up by: Lamar Sprinkles,  April 30, 2008 1:22 PM    Additional Follow-up for Phone Call Additional follow up Details #2::    Lab on your desk if not in comp yet. Follow-up by: Lamar Sprinkles,  April 30, 2008 3:02 PM  Additional Follow-up for Phone Call Additional follow up Details #3:: Details for Additional Follow-up Action Taken: Per Dr positive, possibly prostatitis, Pt informed  Additional Follow-up by: Lamar Sprinkles,  April 30, 2008 6:08 PM  New/Updated Medications: CIPRO 250 MG  TABS (CIPROFLOXACIN HCL) 1 two times a day   Prescriptions: CIPRO 250 MG  TABS (CIPROFLOXACIN HCL) 1 two times a day  #20 x 0   Entered by:   Lamar Sprinkles   Authorized by:   Jacques Navy MD   Signed by:   Lamar Sprinkles on 04/30/2008   Method used:   [facility] ...       [id number]       [address]       [place], [place]  [address]       Ph: [phone] or [phone]       Fax: [phone]   RxID:   [id number]

## 2010-12-12 NOTE — Progress Notes (Signed)
Summary: ? re meds    Phone Note Call from Patient Call back at Home Phone [phone]   Caller: Patient Call For: Russella Dar Reason for Call: Talk to Nurse Summary of Call: Patient was given a medication yesterday at hosp [facility]-Q 1 capsule wants to know if theres a generic or cheaper medication. Initial call taken by: Tawni Levy,  April 20, 2010 10:16 AM  Follow-up for Phone Call        Patient notified that he needs to stay on [facility] Q, that there is no generic. Follow-up by: Darcey Nora RN, CGRN,  April 20, 2010 10:25 AM

## 2010-12-12 NOTE — Progress Notes (Signed)
Summary: ? meds   Phone Note Call from Patient Call back at Home Phone [phone]   Caller: Patient Call For: Woodruff Skirvin Reason for Call: Talk to Nurse Details for Reason: ? meds Summary of Call: needs to discuss cortizone shot  that surgeon has prescb  Initial call taken by: Guadlupe Spanish [facility],  October 19, 2008 10:16 AM  Follow-up for Phone Call        Patient calling to advise [place] that he will have a cortizone shot by his back Careers adviser.  Advised should not cause him any problems with his hx of an ulcer. Follow-up by: Darcey Nora RN,  October 19, 2008 11:52 AM  Additional Follow-up for Phone Call Additional follow up Details #1::        Agree with above. Additional Follow-up by: Meryl Dare MD Clementeen Graham,  October 19, 2008 1:42 PM

## 2010-12-12 NOTE — Assessment & Plan Note (Signed)
Summary: 2 MO ROA/ NWS   Vital Signs:  Patient profile:   65 year old male Weight:      188 pounds Temp:     98.3 degrees F oral Pulse rate:   71 / minute BP sitting:   162 / 92  (left arm)  Vitals Entered By: Tora Perches (May 23, 2009 10:19 AM) CC: f/u Is Patient Diabetic? No   Primary Care Provider:  Trinna Post Peyton Rossner,MD  CC:  f/u.  History of Present Illness: The patient presents for a follow up of hypertension, anemia, LBP    Current Medications (verified): 1)  Protonix 40 Mg  Tbec (Pantoprazole Sodium) .... Take 1 Tablet By Mouth Two Times A Day 2)  Gaviscon 80-14.2 Mg Chew (Alum Hydroxide-Mag Trisilicate) .... 2 By Mouth As Needed 3)  Vitamin D3 1000 Unit  Tabs (Cholecalciferol) .... 2  By Mouth Daily 4)  Oxycodone Hcl 15 Mg Tabs (Oxycodone Hcl) .Marland Kitchen.. 1 By Mouth Qid As Needed Pain. Please,  Fill On or After  May 19, 2009 5)  Promethazine Hcl 25 Mg  Tabs (Promethazine Hcl) .Marland Kitchen.. 1 By Mouth Two Times A Day As Needed Nausea 6)  Lomotil 2.5-0.025 Mg Tabs (Diphenoxylate-Atropine) .Marland Kitchen.. 1-2 By Mouth Two Times A Day As Needed Diarrhea 7)  Flonase 50 Mcg/act Susp (Fluticasone Propionate) .... 2 Sprays Each Nostril 8)  Bifera 28 Mg Tabs (Polysacch Fe Cmp-Fe Heme Poly) .... Once Daily  Allergies: 1)  ! Codeine Phosphate (Codeine Phosphate) 2)  Acetaminophen (Acetaminophen)  Past History:  Past Medical History: Last updated: 03/01/2009 Anemia-iron deficiency Hyperplastic colonic polyps, hx of 01/2000 Diverticulosis, colon Hypertension Low back pain, chronic Nephrolithiasis, hx of  B GERD Peptic ulcer disease with bleeding and GOO, H. pylori Ab negative, 08/2008 Benign prostatic hypertrophy Allergic rhinitis Degenerative disk disease Hyperlipidemia Atrial arrhythmia Renal cyst Duodenal stricture with gastric outlet obstruction Hemorrhoids Barretts Esophagus without dysplasia  Past Surgical History: Last updated: 10/11/2008 BACK SURGERY 02/2003 ON L5 Fusion LS  2009 Dr Jeral Fruit status post repair of hiatal hernia repair appendectomy  Family History: Last updated: 10/11/2008 Family History Hypertension Family History of Colon Cancer: Mother died at age 77 colon cancer. Father died of colon cancer at age 4, Paternal uncle x 2, paternal cousin Family History of Prostate Cancer: Father Family History of Colon Polyps:Father, Mother  Social History: Last updated: 10/11/2008 Occupation: [employer] Married Never Smoked-as a teenager Alcohol use-no Patient gets regular exercise.  Review of Systems  The patient denies fever, decreased hearing, syncope, abdominal pain, and melena.    Physical Exam  General:  Well developed, well nourished, no acute distress. Nose:  External nasal examination shows no deformity or inflammation. Nasal mucosa are pink and moist without lesions or exudates. Mouth:  No deformity or lesions, dentition normal. Lungs:  Clear throughout to auscultation. Heart:  Regular rate and rhythm; no murmurs, rubs,  or bruits. Abdomen:  Soft, nontender and nondistended. No masses, hepatosplenomegaly or hernias noted. Normal bowel sounds. Msk:  Lumbar-sacral spine is tender to palpation over paraspinal muscles and painfull with the ROM  Neurologic:  Alert and  oriented x4;  grossly normal neurologically. Skin:  Clear Psych:  Alert and cooperative. Normal mood and affect.   Impression & Recommendations:  Problem # 1:  FATIGUE (ICD-780.79) Assessment Unchanged  Orders: TLB-CBC Platelet - w/Differential (85025-CBCD) TLB-BMP (Basic Metabolic Panel-BMET) (80048-METABOL) TLB-B12, Serum-Total ONLY (16109-U04)  Problem # 2:  HYPERTENSION (ICD-401.9)  Orders: TLB-CBC Platelet - w/Differential (85025-CBCD) TLB-BMP (Basic Metabolic Panel-BMET) (80048-METABOL) TLB-B12, Serum-Total  ONLY (16109-U04)  Problem # 3:  DYSLIPIDEMIA (ICD-272.4)  Problem # 4:  LOW BACK PAIN (ICD-724.2) Assessment: Unchanged  His updated medication list  for this problem includes:    Oxycodone Hcl 15 Mg Tabs (Oxycodone hcl) .Marland Kitchen... 1 by mouth qid as needed pain. please,  fill on or after  aug. 8, 2010  Orders: TLB-CBC Platelet - w/Differential (85025-CBCD) TLB-BMP (Basic Metabolic Panel-BMET) (80048-METABOL) TLB-B12, Serum-Total ONLY (54098-J19)  Complete Medication List: 1)  Protonix 40 Mg Tbec (Pantoprazole sodium) .... Take 1 tablet by mouth two times a day 2)  Gaviscon 80-14.2 Mg Chew (Alum hydroxide-mag trisilicate) .... 2 by mouth as needed 3)  Vitamin D3 1000 Unit Tabs (Cholecalciferol) .... 2  by mouth daily 4)  Oxycodone Hcl 15 Mg Tabs (Oxycodone hcl) .Marland Kitchen.. 1 by mouth qid as needed pain. please,  fill on or after  aug. 8, 2010 5)  Promethazine Hcl 25 Mg Tabs (Promethazine hcl) .Marland Kitchen.. 1 by mouth two times a day as needed nausea 6)  Lomotil 2.5-0.025 Mg Tabs (Diphenoxylate-atropine) .Marland Kitchen.. 1-2 by mouth two times a day as needed diarrhea 7)  Flonase 50 Mcg/act Susp (Fluticasone propionate) .... 2 sprays each nostril 8)  Bifera 28 Mg Tabs (Polysacch fe cmp-fe heme poly) .... Once daily  Other Orders: TLB-IBC Pnl (Iron/FE;Transferrin) (83550-IBC)  Patient Instructions: 1)  Start taking a yoga class 2)  Try to eat more raw plant food, fresh and dry fruit, raw almonds, leafy vegies, whole foods and less red meat, less animal fat. Avoid processed foods (canned soups, hot dogs, sausage , frozen dinners). Avoid corn syrup or aspartam and Splenda  containing drinks. Make your own salad dressing with olive oil, wine vinegar, garlic etc. 3)  [email or web address].com 4)  www.rawfamily.com 5)  Please schedule a follow-up appointment in 2 months. Prescriptions: OXYCODONE HCL 15 MG TABS (OXYCODONE HCL) 1 by mouth qid as needed pain. Please,  fill on or after  Aug. 8, 2010  #120 x 0   Entered and Authorized by:   Tresa Garter MD   Signed by:   Tresa Garter MD on 05/23/2009   Method used:   Print then Give to Patient   RxID:    [id number]

## 2010-12-12 NOTE — Progress Notes (Signed)
Summary: RF  Phone Note Refill Request Call back at Home Phone [phone]   Refills Requested: Medication #1:  OXYCODONE HCL 15 MG TABS 1 by mouth qid as needed pain. Please Patient is requesting rx to be picked up today  Initial call taken by: Lamar Sprinkles, CMA,  July 22, 2009 9:36 AM  Follow-up for Phone Call        OK to ref Follow-up by: Tresa Garter MD,  July 22, 2009 1:08 PM  Additional Follow-up for Phone Call Additional follow up Details #1::        Pt informed to pick up rx Additional Follow-up by: Lamar Sprinkles, CMA,  July 22, 2009 3:30 PM    New/Updated Medications: OXYCODONE HCL 15 MG TABS (OXYCODONE HCL) 1 by mouth qid as needed pain. Please,  fill on or after  July 22, 2009 Prescriptions: OXYCODONE HCL 15 MG TABS (OXYCODONE HCL) 1 by mouth qid as needed pain. Please,  fill on or after  July 22, 2009  #120 x 0   Entered by:   Lamar Sprinkles, CMA   Authorized by:   Tresa Garter MD   Signed by:   Lamar Sprinkles, CMA on 07/22/2009   Method used:   Print then Give to Patient   RxID:   [id number]

## 2010-12-12 NOTE — Assessment & Plan Note (Signed)
Summary: 2 MO F-UP EGD ON 1/8/FH   History of Present Illness Visit Type: follow up Primary GI MD: Cody Goody MD [facility] Primary Provider: Sonda Primes, MD Chief Complaint: 2 month f/u, having epigastric pain History of Present Illness:   [name] notes recurrent epigastric abdominal pain and reflux symptoms. He notes high fat and high fiber foods as well as overeating can exacerbate symptoms. He does frequently have a bedtime snack and frequently has morning abdominal pain and nausea.   GI Review of Systems    Reports abdominal pain, belching, bloating, and  chest pain.     Location of  Abdominal pain: epigastric area.    Denies acid reflux, dysphagia with liquids, dysphagia with solids, heartburn, loss of appetite, nausea, vomiting, vomiting blood, weight loss, and  weight gain.      Reports hemorrhoids and  rectal bleeding.     Denies anal fissure, black tarry stools, change in bowel habit, constipation, diarrhea, diverticulosis, fecal incontinence, heme positive stool, irritable bowel syndrome, jaundice, light color stool, liver problems, and  rectal pain.   Prior Medications Reviewed Using: Patient Recall  Updated Prior Medication List: PROTONIX 40 MG  TBEC (PANTOPRAZOLE SODIUM) Take 1 tablet by mouth once a day BIFERA 28 MG TABS (POLYSACCH FE CMP-FE HEME POLY) one tablet by mouth once daily GAVISCON 80-14.2 MG CHEW (ALUM HYDROXIDE-MAG TRISILICATE) 2 by mouth as needed  Current Allergies (reviewed today): ! CODEINE PHOSPHATE (CODEINE PHOSPHATE) ACETAMINOPHEN (ACETAMINOPHEN) Past Medical History:    Reviewed history from 08/30/2008 and no changes required:       Anemia-iron deficiency       Hyperplastic colonic polyps, hx of 01/2000       Diverticulosis, colon       Hypertension       Low back pain, chronic       Nephrolithiasis, hx of  B       GERD       Peptic ulcer disease with bleeding and GOO 08/2008       Benign prostatic hypertrophy       Allergic rhinitis       Degenerative disk disease       Hyperlipidemia       Atrial arrhythmia       Renal cyst       Duodenal stricture with gastric outlet obstruction       Hemorrhoids       Barretts Esophagus without dysplasia  Past Surgical History:    Reviewed history from 10/11/2008 and no changes required:       BACK SURGERY 02/2003 ON L5       Fusion LS 2009 Dr Jeral Fruit       status post repair of hiatal hernia repair       appendectomy  Family History:    Reviewed history from 10/11/2008 and no changes required:       Family History Hypertension       Family History of Colon Cancer: Mother died at age 61 colon cancer. Father died of colon cancer at age 15, Paternal uncle x 2, paternal cousin       Family History of Prostate Cancer: Father       Family History of Colon Polyps:Father, Mother  Social History:    Reviewed history from 10/11/2008 and no changes required:       Occupation: [employer]       Married       Never Smoked-as a teenager  Alcohol use-no       Patient gets regular exercise.  Review of Systems       The pertinent positives and negatives are noted as above and in the HPI. All other ROS were negative.   Vital Signs:  Patient Profile:   65 Years Old Male Height:     70 inches Weight:      183.13 pounds BMI:     26.37 Pulse rate:   78 / minute Pulse rhythm:   regular BP sitting:   122 / 72  (left arm)  Vitals Entered By: Chales Abrahams CMA (January 14, 2009 1:41 PM)                  Physical Exam  General:     Well developed, well nourished, no acute distress. Head:     Normocephalic and atraumatic. Mouth:     No deformity or lesions, dentition normal. Lungs:     Clear throughout to auscultation. Heart:     Regular rate and rhythm; no murmurs, rubs,  or bruits. Abdomen:     Soft, nontender and nondistended. No masses, hepatosplenomegaly or hernias noted. Normal bowel sounds. Psych:     Alert and cooperative. Normal mood and affect.   Impression &  Recommendations:  Problem # 1:  OTHER OBSTRUCTION OF DUODENUM (ICD-537.3) Ongoing symptoms suggestive of partial gastric outlet obstruction. Recommended EGD with balloon dilation of his duodenal stricture. The risks, benefits and alternatives to endoscopy with possible biopsy and possible dilation were discussed with the patient and they consent to proceed. The procedure will be scheduled electively. Continue a low-fat, low fiber and antireflux diet. Avoid eating 3 hours prior to bedtime. Continue proton pump inhibitor twice daily. He may take aspirin 81 mg daily if advised by his primary physician. He is advised to avoid all other aspirin and NSAID products long term. Orders: ZEGD Balloon Dil (ZEGD Balloon)   Problem # 2:  BARRETTS ESOPHAGUS (ICD-530.85) Long-term standard antireflux measures. Proton pump inhibitor twice daily.  Orders: ZEGD Balloon Dil (ZEGD Balloon)   Patient Instructions: 1)  You have been scheduled for a Upper Endoscopy with dilitation.  2)  Conscious Sedation brochure given. 3)  Upper Endoscopy with Dilatation brochure given. 4)  Copy Sent To: Cody Primes, MD

## 2010-12-12 NOTE — Procedures (Signed)
Summary: EGD   EGD  Procedure date:  11/19/2008  Findings:      Location: [facility]    Procedures Next Due Date:    EGD: 11/2010  ENDOSCOPY PROCEDURE REPORT  PATIENT:  Cody Frazier, Cody Frazier  MR#:  [id number] BIRTHDATE:   12-17-1945   GENDER:   male  ENDOSCOPIST:   Venita Lick. Russella Dar, MD, [facility] Referred by:   PROCEDURE DATE:  11/19/2008 PROCEDURE:  EGD with biopsy ASA CLASS:   Class II INDICATIONS: follow-up of gastric ulcer   MEDICATIONS:    Fentanyl 100 mcg IV, Versed 10 mg IV TOPICAL ANESTHETIC:   Exactacain Spray  DESCRIPTION OF PROCEDURE:   After the risks benefits and alternatives of the procedure were thoroughly explained, informed consent was obtained.  The LB [id number] endoscope was introduced through the mouth and advanced to the second portion of the duodenum, without limitations.  The instrument was slowly withdrawn as the mucosa was fully examined. <<PROCEDUREIMAGES>>          <<OLD IMAGES>>  An erosion was found in the cardia, in the [facility].  A stricture was found in the bulb of the duodenum. It was soft and smooth. It was 7 mm in size.  Retained food was present in the fundus.  Erythema was found in the distal esophagus. It was 2 cm in length. R/O Barrett's. With standard forceps, a biopsy was obtained and sent to pathology.  Otherwise the examination was normal.  Retroflexed views revealed a hiatal hernia, moderate, 5cm  The scope was then withdrawn from the patient and the procedure completed.  COMPLICATIONS:   None  ENDOSCOPIC IMPRESSION:  1) Erosion in the cardia  2) 7 mm stricture in the bulb of duodenum  3) Food, retained in the fundus  4) 2 cm erythema in the distal esophagus  5) A hiatal hernia RECOMMENDATIONS:  1) await pathology results  2) anti-reflux regimen  3) PPI bid  4) follow-up: GI clinic 2 month(s)  REPEAT EXAM:   In 3 year(s) for EGD,  if Barretts without dysplasia is noted on biopsy    _______________________________ Venita Lick. Russella Dar, MD, [facility]    CC: Janeal Holmes MD        REPORT OF SURGICAL PATHOLOGY   Case #: [id number] Patient Name: Cody, Frazier. Office Chart Number:  [id number]   MRN: [id number] Pathologist: Beulah Gandy. Luisa Hart, MD DOB/Age  [birth year] (Age: 65)    Gender: M Date Taken:  11/19/2008 Date Received: 11/22/2008   FINAL DIAGNOSIS   ***MICROSCOPIC EXAMINATION AND DIAGNOSIS***   ESOPHAGUS:  INTESTINAL METAPLASIA (GOBLET CELL METAPLASIA) CONSISTENT WITH BARRETT' S ESOPHAGUS.  NO DYSPLASIA OR MALIGNANCY IDENTIFIED.   COMMENT An Alcian Blue stain is performed to determine the presence of intestinal metaplasia (goblet cell metaplasia). The Alcian Blue stain shows intestinal metaplasia (goblet cell metaplasia).  The control stained appropriately.   cc Date Reported:  11/23/2008     Beulah Gandy. Luisa Hart, MD *** Electronically Signed Out By JDP ***   November 24, 2008 MRN: [id number]    [name] [address] [place], [place]  [address]    Dear Mr. Romanek,  I am pleased to inform you that the biopsies taken during your recent endoscopic examination did not show any evidence of cancer upon pathologic examination.  However, your biopsies indicate you have a condition known as Barrett's esophagus. While not cancer, it is pre-cancerous (can progress to cancer) and needs to be monitored with repeat endoscopic examination and biopsies.  Fortunately,  it is quite rare that this develops into cancer, but careful monitoring of the condition along with taking your medication as prescribed is important in reducing the risk of developing cancer.  It is my recommendation that you have a repeat upper gastrointestinal endoscopic examination in 3 years.  Continue with treatment plan as outlined the day of your exam.  Please call us if you have or develop heartburn, reflux symptoms, any swallowing problems, or if you have questions about your  condition that have not been fully answered at this time.  Sincerely,  Meryl Dare MD [facility]  This letter has been electronically signed by your physician.   This report was created from the original endoscopy report, which was reviewed and signed by the above listed endoscopist.

## 2010-12-12 NOTE — Progress Notes (Signed)
Summary: rx called in  Medications Added BIFERA 28 MG TABS (POLYSACCH FE CMP-FE HEME POLY) one tablet by mouth once daily       Phone Note Call from Patient Call back at Home Phone [phone]   Caller: Patient Call For: Imane Burrough Reason for Call: Talk to Nurse Summary of Call: samples given to him on iron supplement worked [facility] we [facility] [place],.. Initial call taken by: Tawni Levy,  October 14, 2008 11:41 AM  Follow-up for Phone Call        Rx was sent to pts pharmacy and pt notified.  Follow-up by: Christie Nottingham CMA,  October 14, 2008 11:49 AM    New/Updated Medications: BIFERA 28 MG TABS (POLYSACCH FE CMP-FE HEME POLY) one tablet by mouth once daily   Prescriptions: BIFERA 28 MG TABS (POLYSACCH FE CMP-FE HEME POLY) one tablet by mouth once daily  #30 x 11   Entered by:   Christie Nottingham CMA   Authorized by:   Meryl Dare MD [facility]   Signed by:   Christie Nottingham CMA on 10/14/2008   Method used:   [facility] to        [facility]  [address]  [facility]       [address]       [place], [place]  [address]       Ph: [phone] or [phone]       Fax: [phone]   RxID:   [id number]

## 2010-12-12 NOTE — Progress Notes (Signed)
Summary: Triage   Phone Note From Other Clinic   Caller: Maralyn Sago [phone] Call For: Dr. Russella Dar Summary of Call: Needs to sch'd a 2-3 week hosp. f/u and bloodwork Initial call taken by: Karna Christmas,  April 19, 2010 8:51 AM  Follow-up for Phone Call        REV with Dr Russella Dar for 05/01/10 9:30, CBC 04/28/10 , Sarah will notify the patient  Follow-up by: Darcey Nora RN, CGRN,  April 19, 2010 8:56 AM

## 2010-12-12 NOTE — Letter (Signed)
Summary: [facility]   Imported By: Lennie Odor 07/12/2010 10:33:40  _____________________________________________________________________  External Attachment:    Type:   Image     Comment:   External Document

## 2010-12-12 NOTE — Discharge Summary (Signed)
Summary: Acute GI Bleed  NAME:  Cody Frazier, Cody Frazier               ACCOUNT NO.:  [id number]      MEDICAL RECORD NO.:  [id number]          PATIENT TYPE:  INP      LOCATION:  1415                         FACILITY:  [facility]      PHYSICIAN:  Valerie A. Felicity Coyer, MDDATE OF BIRTH:  12-06-1945      DATE OF ADMISSION:  08/12/2008   DATE OF DISCHARGE:  08/20/2008                                  DISCHARGE SUMMARY      DISCHARGE DIAGNOSES:   1. Acute gastrointestinal bleed secondary to large pyloric ulcer,       status post EGD October 2 with cautery and epi injection, resolved.   2. Gastric outlet obstruction secondary to large pyloric ulcer, status       post NG suction with IV Protonix drip. Now tolerating full liquids       without pain, nausea, vomiting. To continue same until follow-up       with gastroenterology, see below.   3. Acute blood loss anemia secondary to problem #1, status post 3       units packed red blood cells transfusion this admission, hemoglobin       stable.   4. History of hypertension.  Continue holding home treatment until       follow-up with primary MD, see below.   5. History of paroxysmal atrial fibrillation, not on anticoagulation       prior to admission and currently not candidate for anticoagulation       due to above. Normal sinus rhythm at the time of discharge.   6. History of chronic back pain, may continue Percocet p.r.n.       Outpatient follow-up with Dr. Jeral Fruit as needed.   7. History of benign prostatic hypertrophy.  Continue Flomax.   8. History of dyslipidemia.  Continue Zocor.   9. History of seasonal allergies.  Continue Flonase and Allegra as       prior to admission.      DISCHARGE MEDICATIONS:   1. Protonix 40 mg p.o. b.i.d. until further instruction by GI.   2. Flomax 0.4 mg once daily.   3. Percocet 10/325 one every 4 hours as needed.   4. Zocor 80 mg q.h.s.   5. Flonase nasal spray daily into nostrils.   6. Allegra 180 mg once daily as  needed for allergies.   7. Vitamin D 1000 units once daily.      The patient is instructed to hold fosinopril/hydrochlorothiazide until   follow-up with primary MD.  The patient is also instructed to avoid all   NSAIDs such as aspirin, ibuprofen, Aleve, Goody Powders, etc., and   reviewed list with GI. Hospital follow-up is to schedule with primary   care physician, Dr. Trinna Post Plotnikov, to be arranged in the next 2-3   weeks, also with GI physician, Dr. Russella Dar, or Amy Esterwood, PA-C, in the   next 7 to 10 days.  Actual time pending at time of this dictation.      DISPOSITION:  The patient is discharged home in medically  stable and   improved condition.  He is to continue a full liquid only diet as per GI   instructions until follow-up with GI. He is to return to the emergency   room if he develops nausea, vomiting, increasing pain or melanotic   stools. These instructions have been reviewed with him. Information will   be provided at time of discharge regarding diet, and he has reviewed and   agrees to these instructions.      HOSPITAL COURSE BY PROBLEM:   1. Acute upper GI bleed due to large pyloric channel ulcer with       gastric outlet obstruction.  The patient is a 65 year old gentleman       who presented to primary care physician's office due to nausea,       vomiting and stomach pain.  Due to concern for gastroenteritis he       was admitted to [facility] and a GI consult was obtained       due to positive hemoccult of stool.  He was also found to be       significantly anemic at the time of admission with hemoglobin of       7.2, further increasing concern for GI bleed given Hemoccult       positive stool. He had no history of NSAID use and was started on       IV proton pump inhibitor and aggressively re hydrated for       associated hypotension and tachycardia.  He was scheduled for EGD       the following day on October 2 and found to have a large        obstructing pyloric channel ulcer with visible vessel which was       cauterized and injected with epi.  Please see full note by Dr. Wendall Papa regarding this procedure. Because of the associated       obstruction causing his nausea and vomiting he was maintained on an       NG tube with large amounts of bilious output.  After 72 hours for       decompression a follow-up upper GI series which initially showed       continued obstruction on October 5 was delayed as the patient had       decreased biliary output from the NG.  It was clamped and allowed       to clear which he had tolerated well.  Thus NG tube was       discontinued on August 8 and he was advanced to full liquids which       he has tolerated well and is anxious for discharge home.  He has       had no recurrent nausea, vomiting or abdominal pain since advancing       his diet, and his hemoglobin has remained stable without further       need for transfusion after the initial 3 units provided following       this procedure. He is thus felt stable for discharge home on twice       daily proton pump inhibitor, Protonix has been provided.  He is       also given a list of NSAIDs to avoid and instructions on a full       liquid diet by GI.  Follow-up as noted with plans to return to the  ER or MD if problems prior, see instructions below.   2. History of hypertension.  In the setting of acute GI bleed, the       patient's blood pressure has been low to normal after IV hydration       and blood resuscitation. We have held his lisinopril       hydrochlorothiazide during this hospitalization and his blood       pressure still 116/80 with a normal pulse of 88.  We will continue       holding this medication until follow-up with primary MD.   3. Acute blood loss anemia.  This is related to problem #1 above and       as stated he has received 3 units PRBC this hospitalization with       stabilization following treatment of  his ulcer. Outpatient follow-       up as needed.   4. Other medical issues.  The patient may continue his other medical       issues as stated prior to admission. Problems and meds are as       listed above.      Greater than 30 minutes on coordination and discharge planning and   instructions.               Valerie A. Felicity Coyer, MD   Electronically Signed            VAL/MEDQ  D:  08/20/2008  T:  08/20/2008  Job:  [id number]

## 2010-12-12 NOTE — Assessment & Plan Note (Signed)
Summary: 1 MO ROV /NWS $50   Vital Signs:  Patient Profile:   65 Years Old Male Height:     70 inches Weight:      184 pounds Temp:     97.6 degrees F oral Pulse rate:   84 / minute BP sitting:   150 / 92  (left arm)  Vitals Entered By: Tora Perches (September 22, 2008 10:18 AM)                 PCP:  Estrella Myrtle, MD  Chief Complaint:  Multiple medical problems or concerns.  History of Present Illness: F/u LBP, anemia, gastric ulcer    Current Allergies (reviewed today): ! CODEINE PHOSPHATE (CODEINE PHOSPHATE) ACETAMINOPHEN (ACETAMINOPHEN)  Past Medical History:    Reviewed history from 08/30/2008 and no changes required:       Anemia-iron deficiency       Hyperplastic colonic polyps, hx of 01/2000       Diverticulosis, colon       Hypertension       Low back pain, chronic       Nephrolithiasis, hx of  B       GERD       Peptic ulcer disease with bleeding and GOO 08/2008       Benign prostatic hypertrophy       Allergic rhinitis       Degenerative disk disease       Hyperlipidemia       Atrial arrhythmia       Renal cyst       Gastric outlet obstruction       Hemorrhoids   Family History:    Reviewed history from 08/30/2008 and no changes required:       Family History Hypertension       Family History of Colon Cancer: Mother died at age 66. Father died of colon cancer at age 43.        Family History of Prostate Cancer: Father       Family History of Colon Polyps:Father, Mother  Social History:    Reviewed history from 09/11/2007 and no changes required:       Occupation: [employer]       Married       Never Smoked       Alcohol use-no       Regular exercise-no    Review of Systems       The patient complains of fever and prolonged cough.  The patient denies weight loss, weight gain, and depression.     Physical Exam  General:     NAD Ears:     External ear exam shows no significant lesions or deformities.  Otoscopic examination reveals  clear canals, tympanic membranes are intact bilaterally without bulging, retraction, inflammation or discharge. Hearing is grossly normal bilaterally. Mouth:     Oral mucosa and oropharynx without lesions or exudates.  Teeth in good repair. Neck:     No deformities, masses, or tenderness noted. Lungs:     Normal respiratory effort, chest expands symmetrically. Lungs are clear to auscultation, no crackles or wheezes. Heart:     Normal rate and regular rhythm. S1 and S2 normal without gallop, murmur, click, rub or other extra sounds. Abdomen:     Bowel sounds positive,abdomen soft and non-tender without masses, organomegaly or hernias noted. Msk:     Lumbar-sacral spine is tender to palpation over paraspinal muscles and painfull with the ROM  Neurologic:  Alert and  oriented x4;  grossly normal neurologically. Skin:     Clear Psych:     Alert and cooperative. Normal mood and affect.    Impression & Recommendations:  Problem # 1:  FATIGUE (ICD-780.79) Assessment: Improved  Problem # 2:  ULCER-GASTRIC (ICD-531.9) Assessment: Improved  His updated medication list for this problem includes:    Protonix 40 Mg Tbec (Pantoprazole sodium) ..... Once daily   Problem # 3:  G I BLEED (ICD-578.9) Assessment: Comment Only  Problem # 4:  HYPERTENSION (ICD-401.9) Assessment: Unchanged  Problem # 5:  LOW BACK PAIN (ICD-724.2) Assessment: Comment Only  His updated medication list for this problem includes:    Oxycodone Hcl 15 Mg Tabs (Oxycodone hcl) .Marland Kitchen... 1 by mouth qid as needed for pain please,  fill on or after 10/26/08           . ok to fill 1 day earlier when the month has 31 days in it.   Problem # 6:  ANEMIA-IRON DEFICIENCY (ICD-280.9) Assessment: Comment Only Take OTC iron and Vit C qd  Complete Medication List: 1)  Simvastatin 80 Mg Tabs (Simvastatin) .Marland Kitchen.. 1 once daily po 2)  Vitamin D3 1000 Unit Tabs (Cholecalciferol) .Marland Kitchen.. 1 qd 3)  Protonix 40 Mg Tbec (Pantoprazole  sodium) .... Once daily 4)  Oxycodone Hcl 15 Mg Tabs (Oxycodone hcl) .Marland Kitchen.. 1 by mouth qid as needed for pain please,  fill on or after 10/26/08           . ok to fill 1 day earlier when the month has 31 days in it.   Patient Instructions: 1)  Please schedule a follow-up appointment in 2 months. 2)  BMP prior to visit, ICD-9: 280.9 3)  CBC w/ Diff prior to visit, ICD-9:   Prescriptions: OXYCODONE HCL 15 MG  TABS (OXYCODONE HCL) 1 by mouth qid as needed for pain Please,  fill on or after 10/26/08           . OK to fill 1 day earlier when the month has 31 days in it.  #120 x 0   Entered and Authorized by:   Tresa Garter MD   Signed by:   Tresa Garter MD on 09/22/2008   Method used:   Print then Give to Patient   RxID:   [id number] OXYCODONE HCL 15 MG  TABS (OXYCODONE HCL) 1 by mouth qid as needed for pain Please,  fill on or after 09/26/08           . OK to fill 1 day earlier when the month has 31 days in it.  #120 x 0   Entered and Authorized by:   Tresa Garter MD   Signed by:   Tresa Garter MD on 09/22/2008   Method used:   Print then Give to Patient   RxID:   [id number]  ]

## 2010-12-12 NOTE — Assessment & Plan Note (Signed)
Summary: PHYSICAL---STC   Vital Signs:  Patient profile:   65 year old male Weight:      181 pounds BMI:     26.06 Temp:     98.9 degrees F oral Pulse rate:   77 / minute BP sitting:   110 / 70  (left arm)  Vitals Entered By: Tora Perches (February 07, 2010 2:57 PM) CC: cpx Is Patient Diabetic? No   Primary Care Provider:  Trinna Post Plotnikov,MD  CC:  cpx.  History of Present Illness: The patient presents for a wellness examination   Preventive Screening-Counseling & Management  Alcohol-Tobacco     Smoking Status: never  Current Medications (verified): 1)  Protonix 40 Mg  Tbec (Pantoprazole Sodium) .... Take 1 Tablet By Mouth Two Times A Day 2)  Gaviscon 80-14.2 Mg Chew (Alum Hydroxide-Mag Trisilicate) .... 2 By Mouth As Needed 3)  Vitamin D3 1000 Unit  Tabs (Cholecalciferol) .... 2  By Mouth Daily 4)  Oxycodone Hcl 15 Mg Tabs (Oxycodone Hcl) .Marland Kitchen.. 1 By Mouth Qid As Needed Pain. Please,  Fill On or After January 19, 2010 5)  Promethazine Hcl 25 Mg  Tabs (Promethazine Hcl) .Marland Kitchen.. 1 By Mouth Two Times A Day As Needed Nausea 6)  Lomotil 2.5-0.025 Mg Tabs (Diphenoxylate-Atropine) .Marland Kitchen.. 1-2 By Mouth Two Times A Day As Needed Diarrhea 7)  Flonase 50 Mcg/act Susp (Fluticasone Propionate) .... 2 Sprays Each Nostril 8)  Bifera 28 Mg Tabs (Polysacch Fe Cmp-Fe Heme Poly) .... Once Daily 9)  Cozaar 50 Mg Tabs (Losartan Potassium) .... Take 1 Tab By Mouth Every Day  Allergies: 1)  ! Codeine Phosphate (Codeine Phosphate) 2)  Acetaminophen (Acetaminophen)  Past History:  Past Medical History: Last updated: 03/01/2009 Anemia-iron deficiency Hyperplastic colonic polyps, hx of 01/2000 Diverticulosis, colon Hypertension Low back pain, chronic Nephrolithiasis, hx of  B GERD Peptic ulcer disease with bleeding and GOO, H. pylori Ab negative, 08/2008 Benign prostatic hypertrophy Allergic rhinitis Degenerative disk disease Hyperlipidemia Atrial arrhythmia Renal cyst Duodenal stricture with  gastric outlet obstruction Hemorrhoids Barretts Esophagus without dysplasia  Past Surgical History: Last updated: 10/11/2008 BACK SURGERY 02/2003 ON L5 Fusion LS 2009 Dr Jeral Fruit status post repair of hiatal hernia repair appendectomy  Family History: Last updated: 10/11/2008 Family History Hypertension Family History of Colon Cancer: Mother died at age 35 colon cancer. Father died of colon cancer at age 62, Paternal uncle x 2, paternal cousin Family History of Prostate Cancer: Father Family History of Colon Polyps:Father, Mother  Social History: Occupation: [employer] Married x 42 years Never Smoked-as a teenager Alcohol use-no Patient gets regular exercise.  Review of Systems       The patient complains of difficulty walking.  The patient denies anorexia, fever, weight loss, weight gain, vision loss, decreased hearing, hoarseness, chest pain, syncope, dyspnea on exertion, peripheral edema, prolonged cough, headaches, hemoptysis, abdominal pain, melena, hematochezia, severe indigestion/heartburn, hematuria, incontinence, genital sores, muscle weakness, suspicious skin lesions, transient blindness, depression, unusual weight change, abnormal bleeding, enlarged lymph nodes, angioedema, and testicular masses.    Physical Exam  General:  Well developed, well nourished, no acute distress. Head:  Normocephalic and atraumatic without obvious abnormalities. No apparent alopecia or balding. Eyes:  No corneal or conjunctival inflammation noted. EOMI. Perrla.  Ears:  External ear exam shows no significant lesions or deformities.  Otoscopic examination reveals clear canals, tympanic membranes are intact bilaterally without bulging, retraction, inflammation or discharge. Hearing is grossly normal bilaterally. Nose:  External nasal examination shows no deformity or inflammation. Nasal  mucosa are pink and moist without lesions or exudates. Mouth:  Oral mucosa and oropharynx without lesions or  exudates.  Teeth in good repair. Neck:  No deformities, masses, or tenderness noted. Lungs:  Clear throughout to auscultation. Heart:  Regular rate and rhythm; no murmurs, rubs,  or bruits. Abdomen:  Soft, nontender and nondistended. No masses, hepatosplenomegaly or hernias noted. Normal bowel sounds. Rectal:  No external abnormalities noted. Normal sphincter tone. No rectal masses or tenderness. Genitalia:  Testes bilaterally descended without nodularity, tenderness or masses. No scrotal masses or lesions. No penis lesions or urethral discharge. Prostate:  Prostate gland firm and smooth,nodularity, tenderness, mass, asymmetry or induration.1+ enlarged.   Msk:  Lumbar-sacral spine is tender to palpation over paraspinal muscles and painfull with the ROM  Pulses:  R and L carotid,radial,femoral,dorsalis pedis and posterior tibial pulses are full and equal bilaterally Extremities:  No clubbing, cyanosis, edema, or deformity noted with normal full range of motion of all joints.   Neurologic:  Alert and  oriented x4;  grossly normal neurologically. Skin:  Clear Cervical Nodes:  No lymphadenopathy noted Inguinal Nodes:  No significant adenopathy Psych:  Alert and cooperative. Normal mood and affect.   Impression & Recommendations:  Problem # 1:  PHYSICAL EXAMINATION (ICD-V70.0) Assessment New The labs were reviewed with the patient.  Health and age related issues were discussed. Available screening tests and vaccinations were discussed as well. Healthy life style including good diet and execise was discussed. Colon up-to-date Orders: EKG w/ Interpretation (93000) nl  Problem # 2:  LOW BACK PAIN (ICD-724.2) Assessment: Improved Some better w/stretching  His updated medication list for this problem includes:    Oxycodone Hcl 15 Mg Tabs (Oxycodone hcl) .Marland Kitchen... 1 by mouth qid as needed pain. please,  fill on or after may 10th  Problem # 3:  HYPERTENSION (ICD-401.9) Assessment: Improved  His  updated medication list for this problem includes:    Cozaar 50 Mg Tabs (Losartan potassium) .Marland Kitchen... Take 1 tab by mouth every day  BP today: 110/70 Prior BP: 160/98 (09/26/2009)  Labs Reviewed: K+: 4.3 (02/01/2010) Creat: : 1.3 (02/01/2010)   Chol: 227 (02/01/2010)   HDL: 44.90 (02/01/2010)   LDL: 59 (08/02/2008)   TG: 150.0 (02/01/2010)  Problem # 4:  ULCER-DUODENAL (ICD-532.9) Assessment: Improved  His updated medication list for this problem includes:    Protonix 40 Mg Tbec (Pantoprazole sodium) .Marland Kitchen... Take 1 tablet by mouth two times a day    Gaviscon 80-14.2 Mg Chew (Alum hydroxide-mag trisilicate) .Marland Kitchen... 2 by mouth as needed  Complete Medication List: 1)  Oxycodone Hcl 15 Mg Tabs (Oxycodone hcl) .Marland Kitchen.. 1 by mouth qid as needed pain. please,  fill on or after may 10th 2)  Protonix 40 Mg Tbec (Pantoprazole sodium) .... Take 1 tablet by mouth two times a day 3)  Gaviscon 80-14.2 Mg Chew (Alum hydroxide-mag trisilicate) .... 2 by mouth as needed 4)  Vitamin D3 1000 Unit Tabs (Cholecalciferol) .... 2  by mouth daily 5)  Promethazine Hcl 25 Mg Tabs (Promethazine hcl) .Marland Kitchen.. 1 by mouth two times a day as needed nausea 6)  Lomotil 2.5-0.025 Mg Tabs (Diphenoxylate-atropine) .Marland Kitchen.. 1-2 by mouth two times a day as needed diarrhea 7)  Flonase 50 Mcg/act Susp (Fluticasone propionate) .... 2 sprays each nostril 8)  Bifera 28 Mg Tabs (Polysacch fe cmp-fe heme poly) .... Once daily 9)  Cozaar 50 Mg Tabs (Losartan potassium) .... Take 1 tab by mouth every day 10)  Flonase 50 Mcg/act Susp (  Fluticasone propionate) .Marland Kitchen.. 1 spr each nostr qd as needed 11)  Loratadine 10 Mg Tabs (Loratadine) .Marland Kitchen.. 1 by mouth once daily as needed allergies 12)  Centrum Silver Tabs (Multiple vitamins-minerals) .Marland Kitchen.. 1 by mouth qd  Patient Instructions: 1)  Please schedule a follow-up appointment in 3 months. 2)  Start taking a yoga/chair yoga class  aPrescriptions: CENTRUM SILVER  TABS (MULTIPLE VITAMINS-MINERALS) 1 by mouth qd   #100 x 3   Entered and Authorized by:   Tresa Garter MD   Signed by:   Tresa Garter MD on 02/07/2010   Method used:   Print then Give to Patient   RxID:   [id number] BIFERA 28 MG TABS (POLYSACCH FE CMP-FE HEME POLY) once daily  #100 x 3   Entered and Authorized by:   Tresa Garter MD   Signed by:   Tresa Garter MD on 02/07/2010   Method used:   Print then Give to Patient   RxID:   [id number] VITAMIN D3 1000 UNIT  TABS (CHOLECALCIFEROL) 2  by mouth daily  #100 x 3   Entered and Authorized by:   Tresa Garter MD   Signed by:   Tresa Garter MD on 02/07/2010   Method used:   Print then Give to Patient   RxID:   [id number] GAVISCON 80-14.2 MG CHEW (ALUM HYDROXIDE-MAG TRISILICATE) 2 by mouth as needed  #1 mo x 12   Entered and Authorized by:   Tresa Garter MD   Signed by:   Tresa Garter MD on 02/07/2010   Method used:   Print then Give to Patient   RxID:   [id number] OXYCODONE HCL 15 MG TABS (OXYCODONE HCL) 1 by mouth qid as needed pain. Please,  fill on or after May 10th  #120 x 0   Entered and Authorized by:   Tresa Garter MD   Signed by:   Tresa Garter MD on 02/07/2010   Method used:   Print then Give to Patient   RxID:   [id number] OXYCODONE HCL 15 MG TABS (OXYCODONE HCL) 1 by mouth qid as needed pain. Please,  fill on or after April 10th  #120 x 0   Entered and Authorized by:   Tresa Garter MD   Signed by:   Tresa Garter MD on 02/07/2010   Method used:   Print then Give to Patient   RxID:   [id number] LORATADINE 10 MG TABS (LORATADINE) 1 by mouth once daily as needed allergies  #30 x 6   Entered and Authorized by:   Tresa Garter MD   Signed by:   Tresa Garter MD on 02/07/2010   Method used:   Print then Give to Patient   RxID:   [id number] FLONASE 50 MCG/ACT SUSP (FLUTICASONE PROPIONATE) 1 spr each nostr qd as needed  #1 x 12   Entered and  Authorized by:   Tresa Garter MD   Signed by:   Tresa Garter MD on 02/07/2010   Method used:   Print then Give to Patient   RxID:   [id number]    Tetanus/Td Vaccine (to be given today)

## 2010-12-12 NOTE — Assessment & Plan Note (Signed)
Summary: F/U APPT PER PT/$5O/CD   Vital Signs:  Patient Profile:   65 Years Old Male Weight:      174 pounds Temp:     96.7 degrees F oral Pulse rate:   118 / minute BP sitting:   144 / 100  (left arm)  Vitals Entered By: Tora Perches (August 26, 2008 10:57 AM)                 Chief Complaint:  Multiple medical problems or concerns.  History of Present Illness: F/u GI bleeed, anemia, ulcer. C/o weakness.    Current Allergies (reviewed today): ! CODEINE PHOSPHATE (CODEINE PHOSPHATE) ACETAMINOPHEN (ACETAMINOPHEN)  Past Medical History:    Anemia-iron deficiency    Colonic polyps, hx of    Diverticulosis, colon    Hypertension    Low back pain, chronic    Nephrolithiasis, hx of  B    GERD    Peptic ulcer disease, recurrent. 3 cm ulcer 2009 w/bleed  Dr Russella Dar    Pyloric stenosis 2009    Benign prostatic hypertrophy    Allergic rhinitis   Family History:    Reviewed history from 09/11/2007 and no changes required:       Family History Hypertension  Social History:    Reviewed history from 09/11/2007 and no changes required:       Occupation: [employer]       Married       Never Smoked       Alcohol use-no       Regular exercise-no    Review of Systems       The patient complains of dyspnea on exertion and abdominal pain.  The patient denies weight loss and weight gain.     Physical Exam  General:     Tired In no acute distress  Eyes:     No corneal or conjunctival inflammation noted. EOMI. Perrla. Funduscopic exam benign, without hemorrhages, exudates or papilledema. Vision grossly normal. Nose:     External nasal examination shows no deformity or inflammation. Nasal mucosa are pink and moist without lesions or exudates. Mouth:     Oral mucosa and oropharynx without lesions or exudates.  Teeth in good repair. Neck:     No deformities, masses, or tenderness noted. Lungs:     Normal respiratory effort, chest expands symmetrically. Lungs are clear to  auscultation, no crackles or wheezes. Heart:     Normal rate and regular rhythm. S1 and S2 normal without gallop, murmur, click, rub or other extra sounds. Abdomen:     S/nt Msk:     No deformity or scoliosis noted of thoracic or lumbar spine.   Neurologic:     No cranial nerve deficits noted. Station and gait are normal. Plantar reflexes are down-going bilaterally. DTRs are symmetrical throughout. Sensory, motor and coordinative functions appear intact. Skin:     Intact without suspicious lesions or rashes Psych:     Cognition and judgment appear intact. Alert and cooperative with normal attention span and concentration. No apparent delusions, illusions, hallucinations    Impression & Recommendations:  Problem # 1:  ABDOMINAL PAIN, EPIGASTRIC (ICD-789.06) Assessment: Improved Due to ulcer  Problem # 2:  G I BLEED (ICD-578.9) Assessment: Improved Get CBC. D/c acetominophen  Problem # 3:  FATIGUE (ICD-780.79) Assessment: Unchanged  Problem # 4:  LOW BACK PAIN (ICD-724.2) Assessment: Unchanged  The following medications were removed from the medication list:    Percocet 10-325 Mg Tabs (Oxycodone-acetaminophen) .Marland Kitchen... 1po qid  as needed for pain please,  fill on or after 07/22/08  His updated medication list for this problem includes:    Oxycodone Hcl 15 Mg Tabs (Oxycodone hcl) .Marland Kitchen... 1 by mouth qid as needed for pain   Problem # 5:  GERD (ICD-530.81) Assessment: Unchanged  His updated medication list for this problem includes:    Ranitidine Hcl 150 Mg Caps (Ranitidine hcl) ..... Once daily    Protonix 40 Mg Tbec (Pantoprazole sodium) ..... Once daily   Problem # 6:  ANEMIA-IRON DEFICIENCY (ICD-280.9) Assessment: Unchanged  His updated medication list for this problem includes:    Chromagen Forte 50-101-1 Mg Tabs (Feasp-fefum -suc-c-thre-b12-fa) ..... Once daily   Complete Medication List: 1)  Ranitidine Hcl 150 Mg Caps (Ranitidine hcl) .... Once daily 2)  Flomax 0.4  Mg Cp24 (Tamsulosin hcl) .... Once daily 3)  Chromagen Forte 50-101-1 Mg Tabs (Feasp-fefum -suc-c-thre-b12-fa) .... Once daily 4)  Simvastatin 80 Mg Tabs (Simvastatin) .Marland Kitchen.. 1 once daily po 5)  Flonase 50 Mcg/act Susp (Fluticasone propionate) .... 2 sprays each day 6)  Allegra 180 Mg Tabs (Fexofenadine hcl) .Marland Kitchen.. 1 by mouth once daily as needed allergies 7)  Fosinopril Sodium-hctz 20-12.5 Mg Tabs (Fosinopril sodium-hctz) .Marland Kitchen.. 1 by mouth qam 8)  Vitamin D3 1000 Unit Tabs (Cholecalciferol) .Marland Kitchen.. 1 qd 9)  Protonix 40 Mg Tbec (Pantoprazole sodium) .... Once daily 10)  Oxycodone Hcl 15 Mg Tabs (Oxycodone hcl) .Marland Kitchen.. 1 by mouth qid as needed for pain   Patient Instructions: 1)  Please schedule a follow-up appointment in 1 month. 2)  BMP prior to visit, ICD-9: 3)  Hepatic Panel prior to visit, ICD-9: 4)  TSH prior to visit, ICD-9: 5)  CBC w/ Diff prior to visit, ICD-9: 6)  Iron/TIBC 7)  Vit B12   280.9  782.0 995.20   Prescriptions: OXYCODONE HCL 15 MG  TABS (OXYCODONE HCL) 1 by mouth qid as needed for pain  #120 x 0   Entered and Authorized by:   Tresa Garter MD   Signed by:   Tresa Garter MD on 08/26/2008   Method used:   Print then Give to Patient   RxID:   [id number]  ]

## 2010-12-12 NOTE — Assessment & Plan Note (Signed)
Summary: 3 MTH FU-STC   Vital Signs:  Patient Profile:   65 Years Old Male Weight:      190 pounds Temp:     98.1 degrees F oral Pulse rate:   74 / minute BP sitting:   130 / 86  (left arm)  Vitals Entered By: Tora Perches (January 22, 2008 8:24 AM)             Is Patient Diabetic? No     Chief Complaint:  Multiple medical problems or concerns.  History of Present Illness: The patient presents for a follow up of back pain, anxiety, depression and headaches.     Current Allergies: ! CODEINE PHOSPHATE (CODEINE PHOSPHATE)  Past Medical History:    Reviewed history from 10/22/2007 and no changes required:       Anemia-iron deficiency       Colonic polyps, hx of       Diverticulosis, colon       Hypertension       Low back pain, chronic       Nephrolithiasis, hx of  B       GERD       Peptic ulcer disease       Benign prostatic hypertrophy       Allergic rhinitis  Past Surgical History:    Reviewed history from 06/09/2007 and no changes required:       BACK SURGERY 02/2003 ON L5   Family History:    Reviewed history from 09/11/2007 and no changes required:       Family History Hypertension  Social History:    Reviewed history from 09/11/2007 and no changes required:       Occupation: Reverent       Married       Never Smoked       Alcohol use-no       Regular exercise-no    Review of Systems       The patient complains of weight gain.  The patient denies chest pain, dyspnea on exhertion, hemoptysis, abdominal pain, melena, and hematuria.     Physical Exam  General:     Well-developed,well-nourished,in no acute distress; alert,appropriate and cooperative throughout examination Eyes:     No corneal or conjunctival inflammation noted. EOMI. Perrla. Funduscopic exam benign, without hemorrhages, exudates or papilledema. Vision grossly normal. Ears:     External ear exam shows no significant lesions or deformities.  Otoscopic examination reveals clear  canals, tympanic membranes are intact bilaterally without bulging, retraction, inflammation or discharge. Hearing is grossly normal bilaterally. Mouth:     Oral mucosa and oropharynx without lesions or exudates.  Teeth in good repair. Neck:     No deformities, masses, or tenderness noted. Lungs:     Normal respiratory effort, chest expands symmetrically. Lungs are clear to auscultation, no crackles or wheezes. Heart:     Normal rate and regular rhythm. S1 and S2 normal without gallop, murmur, click, rub or other extra sounds. Abdomen:     Bowel sounds positive,abdomen soft and non-tender without masses, organomegaly or hernias noted. Msk:     No deformity or scoliosis noted of thoracic or lumbar spine.   Neurologic:     No cranial nerve deficits noted. Station and gait are normal. Plantar reflexes are down-going bilaterally. DTRs are symmetrical throughout. Sensory, motor and coordinative functions appear intact. Skin:     Intact without suspicious lesions or rashes Psych:     Cognition and judgment appear intact. Alert and  cooperative with normal attention span and concentration. No apparent delusions, illusions, hallucinations    Impression & Recommendations:  Problem # 1:  LOW BACK PAIN (ICD-724.2) Assessment: Unchanged Exercising His updated medication list for this problem includes:    Percocet 10-325 Mg Tabs (Oxycodone-acetaminophen) .Marland Kitchen... 1po qid as needed for pain please,  fill on or after 4/12/9   Problem # 2:  PEPTIC ULCER DISEASE (ICD-533.90) Assessment: Improved  His updated medication list for this problem includes:    Prevacid 30 Mg Cpdr (Lansoprazole) ..... Qd    Ranitidine Hcl 150 Mg Caps (Ranitidine hcl) ..... Once daily   Problem # 3:  DYSLIPIDEMIA (ICD-272.4) Assessment: Unchanged  His updated medication list for this problem includes:    Simvastatin 80 Mg Tabs (Simvastatin) .Marland Kitchen... 1 once daily po   Problem # 4:  NEPHROLITHIASIS, HX OF  (ICD-V13.01) Assessment: Improved No recurrence  Problem # 5:  ANEMIA-IRON DEFICIENCY (ICD-280.9) Assessment: Improved  His updated medication list for this problem includes:    Chromagen Forte 50-101-1 Mg Tabs (Feasp-fefum -suc-c-thre-b12-fa) ..... Once daily   Complete Medication List: 1)  Prevacid 30 Mg Cpdr (Lansoprazole) .... Qd 2)  Ranitidine Hcl 150 Mg Caps (Ranitidine hcl) .... Once daily 3)  Flomax 0.4 Mg Cp24 (Tamsulosin hcl) .... Once daily 4)  Chromagen Forte 50-101-1 Mg Tabs (Feasp-fefum -suc-c-thre-b12-fa) .... Once daily 5)  Percocet 10-325 Mg Tabs (Oxycodone-acetaminophen) .Marland Kitchen.. 1po qid as needed for pain please,  fill on or after 4/12/9 6)  Simvastatin 80 Mg Tabs (Simvastatin) .Marland Kitchen.. 1 once daily po 7)  Flonase 50 Mcg/act Susp (Fluticasone propionate) .... 2 sprays each day 8)  Allegra 180 Mg Tabs (Fexofenadine hcl) .Marland Kitchen.. 1 by mouth once daily as needed allergies 9)  Fosinopril Sodium-hctz 20-12.5 Mg Tabs (Fosinopril sodium-hctz) .Marland Kitchen.. 1 by mouth qam 10)  Vitamin D3 1000 Unit Tabs (Cholecalciferol) .Marland Kitchen.. 1 qd   Patient Instructions: 1)  Please schedule a follow-up appointment in 3 months. 2)  BMP prior to visit, ICD-9: 3)  TSH prior to visit, ICD-9:401.1 280.9 4)  CBC w/ Diff prior to visit, ICD-9: 5)  HbgA1C prior to visit, ICD-9:    Prescriptions: PERCOCET 10-325 MG  TABS (OXYCODONE-ACETAMINOPHEN) 1po qid as needed for pain Please,  fill on or after 4/12/9  #120 x 0   Entered and Authorized by:   Tresa Garter MD   Signed by:   Tresa Garter MD on 01/22/2008   Method used:   Print then Give to Patient   RxID:   [id number] PERCOCET 10-325 MG  TABS (OXYCODONE-ACETAMINOPHEN) 1po qid as needed for pain  #120 x 0   Entered and Authorized by:   Tresa Garter MD   Signed by:   Tresa Garter MD on 01/22/2008   Method used:   Print then Give to Patient   RxID:   [id number]  ]

## 2010-12-12 NOTE — Progress Notes (Signed)
Summary: RX Refill  Phone Note Refill Request Call back at [facility] Phone [phone] Call back at Work Phone [phone] Message from:  Patient  Refills Requested: Medication #1:  OXYCODONE HCL 15 MG TABS 1 by mouth qid as needed pain. Please   Dosage confirmed as above?Dosage Confirmed Initial call taken by: Sydell Axon  January 18, 2010 10:12 AM  Follow-up for Phone Call        ok to ref Follow-up by: Tresa Garter MD,  January 18, 2010 1:24 PM  Additional Follow-up for Phone Call Additional follow up Details #1::        Pt informed, rx ready Additional Follow-up by: Lamar Sprinkles, CMA,  January 18, 2010 5:17 PM    New/Updated Medications: OXYCODONE HCL 15 MG TABS (OXYCODONE HCL) 1 by mouth qid as needed pain. Please,  fill on or after January 19, 2010 Prescriptions: OXYCODONE HCL 15 MG TABS (OXYCODONE HCL) 1 by mouth qid as needed pain. Please,  fill on or after January 19, 2010  #120 x 0   Entered by:   Lamar Sprinkles, CMA   Authorized by:   Tresa Garter MD   Signed by:   Lamar Sprinkles, CMA on 01/18/2010   Method used:   Print then Give to Patient   RxID:   [id number]

## 2010-12-12 NOTE — Progress Notes (Signed)
Summary: Percocet  Phone Note Call from Patient Call back at Home Phone [phone]   Summary of Call: Patient left message on triage that he will run out of his Percocet tomorrow that he takes for back pain. Patient would like refill, please call when ready.  *This currently is not on patient med list, Please advise. Initial call taken by: Lucious Groves,  December 21, 2009 8:22 AM  Follow-up for Phone Call        OK to ref Oxycodone Follow-up by: Tresa Garter MD,  December 21, 2009 1:01 PM  Additional Follow-up for Phone Call Additional follow up Details #1::        Patient notified prescription is ready for pickup. Additional Follow-up by: Lucious Groves,  December 21, 2009 2:01 PM    New/Updated Medications: OXYCODONE HCL 15 MG TABS (OXYCODONE HCL) 1 by mouth qid as needed pain. Please,  fill on or after Dec 22, 2009 Prescriptions: OXYCODONE HCL 15 MG TABS (OXYCODONE HCL) 1 by mouth qid as needed pain. Please,  fill on or after Dec 22, 2009  #120 x 0   Entered by:   Rock Nephew CMA   Authorized by:   Tresa Garter MD   Signed by:   Rock Nephew CMA on 12/21/2009   Method used:   Print then Give to Patient   RxID:   [id number] OXYCODONE HCL 15 MG TABS (OXYCODONE HCL) 1 by mouth qid as needed pain. Please,  fill on or after Dec 22, 2009  #120 x 0   Entered by:   Rock Nephew CMA   Authorized by:   Tresa Garter MD   Signed by:   Rock Nephew CMA on 12/21/2009   Method used:   Print then Give to Patient   RxID:   [id number]  Patient only given second print rx, first one was an error/la

## 2010-12-12 NOTE — Assessment & Plan Note (Signed)
Summary: POST HOSP/GASTRIC OUTLET OBSTRUCTIONS/YF   History of Present Illness Visit Type: follow up Primary GI MD: Elie Goody MD [facility] Primary Provider: Estrella Myrtle, MD Chief Complaint: Follow-up visit- post hosp, ulcer causing gastric outlet obstruction History of Present Illness:   Mr. Pouliot returns following hospitalization for a large pyloric channel ulcer complicated by bleeding, gastric outlet obstruction and an acute blood loss anemia. His discharge hemoglobin was 11.5. He is tolerating a low residue and blenderized diet. He states his energy level is low and he is slowly planning to return to work over the next few weeks.   GI Review of Systems    Reports acid reflux.      Denies abdominal pain, belching, bloating, chest pain, dysphagia with liquids, dysphagia with solids, heartburn, loss of appetite, nausea, vomiting, vomiting blood, weight loss, and  weight gain.      Reports diverticulosis.     Denies anal fissure, black tarry stools, change in bowel habit, constipation, diarrhea, fecal incontinence, heme positive stool, hemorrhoids, irritable bowel syndrome, jaundice, light color stool, liver problems, rectal bleeding, and  rectal pain.    Prior Medications Reviewed Using: List Brought by Patient  Updated Prior Medication List: SIMVASTATIN 80 MG TABS (SIMVASTATIN) 1 once daily po VITAMIN D3 1000 UNIT  TABS (CHOLECALCIFEROL) 1 qd PROTONIX 40 MG  TBEC (PANTOPRAZOLE SODIUM) once daily OXYCODONE HCL 15 MG  TABS (OXYCODONE HCL) 1 by mouth qid as needed for pain  Current Allergies (reviewed today): ! CODEINE PHOSPHATE (CODEINE PHOSPHATE) ACETAMINOPHEN (ACETAMINOPHEN)  Past Medical History:    Anemia-iron deficiency    Hyperplastic colonic polyps, hx of 01/2000    Diverticulosis, colon    Hypertension    Low back pain, chronic    Nephrolithiasis, hx of  B    GERD    Peptic ulcer disease with bleeding and GOO 08/2008    Benign prostatic hypertrophy    Allergic  rhinitis    Degenerative disk disease    Hyperlipidemia    Atrial arrhythmia    Renal cyst    Gastric outlet obstruction    Hemorrhoids  Past Surgical History:    Reviewed history from 08/12/2008 and no changes required:       BACK SURGERY 02/2003 ON L5       Fusion LS 2009 Dr Jeral Fruit       He is status post repair of hiatal hernia       He has had an appendectomy  Family History:    Reviewed history from 08/27/2008 and no changes required:       Family History Hypertension       Family History of Colon Cancer: Mother died at age 99. Father died of colon cancer at age 87.        Family History of Prostate Cancer: Father       Family History of Colon Polyps:Father, Mother  Social History:    Reviewed history from 09/11/2007 and no changes required:       Occupation: Reverent       Married       Never Smoked       Alcohol use-no       Regular exercise-no    Vital Signs:  Patient Profile:   65 Years Old Male Height:     70 inches Weight:      178 pounds BMI:     25.63 BSA:     1.99 Pulse rate:   88 / minute Pulse rhythm:  regular BP sitting:   108 / 66  (right arm)  Vitals Entered By: Teresita Madura CMA (August 30, 2008 10:34 AM)                  Physical Exam  General:     Well developed, well nourished, no acute distress. fatigued appearing Head:     Normocephalic and atraumatic. Eyes:     PERRLA, no icterus. Mouth:     No deformity or lesions, dentition normal. Lungs:     Clear throughout to auscultation. Heart:     Regular rate and rhythm; no murmurs, rubs,  or bruits. Abdomen:     Soft, nontender and nondistended. No masses, hepatosplenomegaly or hernias noted. Normal bowel sounds. Neurologic:     Alert and  oriented x4;  grossly normal neurologically. Psych:     Alert and cooperative. Normal mood and affect.   Impression & Recommendations:  Problem # 1:  ACUTE GASTRIC ULCER W/HEMORRHAGE AND OBSTRUCTION (ICD-531.01) Continue  pantoprazole 40 mg daily indefinitely. Long-term avoidance of aspirin and NSAID products. Plan for followup EGD in January 2010.  Problem # 2:  ACUTE POSTHEMORRHAGIC ANEMIA (ICD-285.1) Orders: TLB-CBC Platelet - w/Differential ([id number])   Problem # 3:  FAMILY HX COLON CANCER (ICD-V16.0) Strong family history of colon cancer in both his father and mother and two uncles. Screening colonoscopy recommended in May 2001.  Patient Instructions: 1)  Please get your labs drawn today in the basement. 2)  Please schedule a follow-up appointment in  6 weeks. 3)  Copy Sent To: Sonda Primes, MD  ]

## 2010-12-12 NOTE — Progress Notes (Signed)
Summary: Barrett's esoph. diet   Phone Note Call from Patient Call back at [facility] Phone [phone]   Caller: Patient Call For: Dr. Russella Dar Reason for Call: Talk to Nurse Details for Reason: Barrett's esoph. diet Summary of Call: pt receive information regarding special diet for Barrett's esophagus after having EGD... pt has ?'s regarding this diet... specifically cornbread and potatoes- why cant he eat these? Initial call taken by: Vallarie Mare,  December 06, 2008 1:40 PM  Follow-up for Phone Call        I spoke w/pt. and I am not sure who told him to hold cornbread and potatoes. I reviewed information on Barrett's and following a reflux diet. Pt. also mentioned he has diverticulosis, again I am not aware of any specific reason to hold cornbread and potatoes, but I reviewed diets for diverticulosis and diverticulitis w/pt.  I will mail pt. information on Barrett's and diverticulosis.  He is aware I will callback if there are any further orders after Dr.Aveer Bartow reviews this note.Pt. instructed to call back as needed.  Follow-up by: Laureen Ochs LPN,  December 06, 2008 2:09 PM

## 2010-12-12 NOTE — Procedures (Signed)
Summary: Colon   Colonoscopy  Procedure date:  03/13/2007  Findings:      Results: Hemorrhoids.     Results: Diverticulosis.       Location:  [facility].    Procedures Next Due Date:    Colonoscopy: 03/2010  Patient Name: Cody Frazier, Cody Frazier. MRN:  Procedure Procedures: Colonoscopy CPT: [phone].  Personnel: Endoscopist: Venita Lick. Russella Dar, MD, Clementeen Graham.  Exam Location: Exam performed in Outpatient Clinic. Outpatient  Patient Consent: Procedure, Alternatives, Risks and Benefits discussed, consent obtained, from patient. Consent was obtained by the RN.  Indications  Increased Risk Screening: For family history of colorectal neoplasia, in  parent aunt, uncle  Comments: Mother, father,aunt and uncle all with colon cancer,. History  Current Medications: Patient is not currently taking Coumadin.  Pre-Exam Physical: Performed Mar 13, 2007. Cardio-pulmonary exam, Rectal exam, HEENT exam , Abdominal exam, Mental status exam WNL.  Comments: Pt. history reviewed/updated, physical exam performed prior to initiation of sedation?Yes Exam Exam: Extent of exam reached: Cecum, extent intended: Cecum.  The cecum was identified by appendiceal orifice and IC valve. Time to Cecum: 00:07: 04. Time for Withdrawl: 00:09:40. Colon retroflexion performed. ASA Classification: II. Tolerance: excellent.  Monitoring: Pulse and BP monitoring, Oximetry used. Supplemental O2 given.  Colon Prep Used MoviPrep for colon prep. Prep results: good.  Sedation Meds: Patient assessed and found to be appropriate for moderate (conscious) sedation. Fentanyl 100 mcg. given IV. Versed 10 mg. given IV.  Comments: Tortuous colon Findings - DIVERTICULOSIS: Descending Colon to Sigmoid Colon. Not bleeding. ICD9: Diverticulosis: 562.10. Comments: spasm, tortuous.  NORMAL EXAM: Cecum to Splenic Flexure.  HEMORRHOIDS: Internal. Size: Medium. Not bleeding. Not thrombosed. ICD9: Hemorrhoids, Internal: 455.0.    Assessment  Diagnoses: 562.10: Diverticulosis.  455.0: Hemorrhoids, Internal.   Events  Unplanned Interventions: No intervention was required.  Plans  Post Exam Instructions: Post sedation instructions given.  Patient Education: Patient given standard instructions for: Diverticulosis. Hemorrhoids.  Disposition: After procedure patient sent to recovery. After recovery patient sent home.  Scheduling/Referral: Colonoscopy, to [facility]. Russella Dar, MD, [facility], around Mar 12, 2010.    This report was created from the original endoscopy report, which was reviewed and signed by the above listed endoscopist.    cc: Linda Hedges. Plotniknov, MD

## 2010-12-12 NOTE — Progress Notes (Signed)
Summary: Call Report  Phone Note Other Incoming   Caller: [facility] Call Report Summary of Call: [facility] Triage Call Report Triage Record Num: [id number] Operator: Patriciaann Clan Patient Name: Cody Frazier Call Date & Time: 04/09/2010 1:15:30PM Patient Phone: [phone] PCP: Sonda Primes Patient Gender: Male PCP Fax : [phone] Patient DOB: 1946/10/17 Practice Name: [facility] Reason for Call: Patient calling. States BP has been fluctuating, onset 04/04/10. States BP 156/1005 04/08/10. States BP 80/50 04/09/10 a.m. States BP 92/73 04/09/10 1300. States feels weak, denies blood in stools or melena. States noted decreased urinary output 04/09/10. States feels urge to urinate but is unable. States last urinated 0000 04/09/10. Patient advised to be seen in ED now, advised not to drive self. Advised to change positions slowly. Patietn agreeable. Advised [facility], states prefers [facility] ED and it is closer. Protocol(s) Used: Urinary Symptoms / Prostate Problems Recommended Outcome per Protocol: See Provider within 4 hours Override Outcome if Used in Protocol: See ED Immediately RN Reason for Override Outcome: Nursing Judgement Used. Reason for Outcome: No urination for 8 hours or more Care Advice:  ~ Another adult should drive.  ~ SYMPTOM / CONDITION MANAGEMENT 05/ Initial call taken by: Margaret Pyle, CMA,  Apr 11, 2010 8:07 AM  Follow-up for Phone Call        agree Follow-up by: Tresa Garter MD,  Apr 11, 2010 1:47 PM

## 2010-12-12 NOTE — Assessment & Plan Note (Signed)
Summary: post hospital GI bleed/sheri   History of Present Illness Visit Type: Follow-up Visit Primary GI MD: Elie Goody MD [facility] Primary Provider: Sonda Primes, MD Chief Complaint: Patient is following up after a GI bleed, he is still feeling tired but much etter than before. He has a headache this morning.  History of Present Illness:   This is a return office visit, following hospitalization for bleeding duodenal ulcer with a chronic duodenal stricture. This ulcer occurred while on a PPI. Formal ulcer surgery is planned by Dr. Michaell Cowing. The patient notes persistent fatigue, and decreased exercise tolerance. He is tolerating a pured diet.   GI Review of Systems      Denies abdominal pain, acid reflux, belching, bloating, chest pain, dysphagia with liquids, dysphagia with solids, heartburn, loss of appetite, nausea, vomiting, vomiting blood, weight loss, and  weight gain.        Denies anal fissure, black tarry stools, change in bowel habit, constipation, diarrhea, diverticulosis, fecal incontinence, heme positive stool, hemorrhoids, irritable bowel syndrome, jaundice, light color stool, liver problems, rectal bleeding, and  rectal pain.   Current Medications (verified): 1)  Oxycodone Hcl 15 Mg Tabs (Oxycodone Hcl) .Marland Kitchen.. 1 By Mouth Qid As Needed Pain. Please,  Fill On or After   06/26/10         . Ok To Fill 1-2 Day Earlier When The Month Has 31 Days in It. 2)  Protonix 40 Mg  Tbec (Pantoprazole Sodium) .... Take 1 Tablet By Mouth Two Times A Day 3)  Gaviscon 80-14.2 Mg Chew (Alum Hydroxide-Mag Trisilicate) .... 2 By Mouth As Needed 4)  Vitamin D3 1000 Unit  Tabs (Cholecalciferol) .... 2  By Mouth Daily 5)  Promethazine Hcl 25 Mg  Tabs (Promethazine Hcl) .Marland Kitchen.. 1 By Mouth Two Times A Day As Needed Nausea 6)  Lomotil 2.5-0.025 Mg Tabs (Diphenoxylate-Atropine) .Marland Kitchen.. 1-2 By Mouth Two Times A Day As Needed Diarrhea 7)  Flonase 50 Mcg/act Susp (Fluticasone Propionate) .... 2 Sprays Each  Nostril 8)  Bifera 28 Mg Tabs (Polysacch Fe Cmp-Fe Heme Poly) .... Once Daily 9)  Cozaar 50 Mg Tabs (Losartan Potassium) .... Take 1 Tab By Mouth Every Day 10)  Flonase 50 Mcg/act Susp (Fluticasone Propionate) .Marland Kitchen.. 1 Spr Each Nostr Qd As Needed 11)  Loratadine 10 Mg Tabs (Loratadine) .Marland Kitchen.. 1 By Mouth Once Daily As Needed Allergies 12)  Centrum Silver  Tabs (Multiple Vitamins-Minerals) .Marland Kitchen.. 1 By Mouth Qd 13)  Ferrous Sulfate 325 (65 Fe) Mg Tabs (Ferrous Sulfate) .Marland Kitchen.. 1 By Mouth Three Times A Day (Out) 14)  Flora-Q  Caps (Probiotic Product) .Marland Kitchen.. 1 By Mouth Bid 15)  Sucralfate 1 Gm Tabs (Sucralfate) .Marland Kitchen.. 1 By Mouth Bid 16)  Psyllium Fiber Packet .Marland Kitchen.. 1 Pack By Mouth Bid  Allergies (verified): 1)  ! Codeine Phosphate (Codeine Phosphate) 2)  Acetaminophen (Acetaminophen)  Past History:  Past Medical History: Reviewed history from 03/01/2009 and no changes required. Anemia-iron deficiency Hyperplastic colonic polyps, hx of 01/2000 Diverticulosis, colon Hypertension Low back pain, chronic Nephrolithiasis, hx of  B GERD Peptic ulcer disease with bleeding and GOO, H. pylori Ab negative, 08/2008 Benign prostatic hypertrophy Allergic rhinitis Degenerative disk disease Hyperlipidemia Atrial arrhythmia Renal cyst Duodenal stricture with gastric outlet obstruction Hemorrhoids Barretts Esophagus without dysplasia  Past Surgical History: Reviewed history from 10/11/2008 and no changes required. BACK SURGERY 02/2003 ON L5 Fusion LS 2009 Dr Jeral Fruit status post repair of hiatal hernia repair appendectomy  Family History: Reviewed history from 10/11/2008 and no  changes required. Family History Hypertension Family History of Colon Cancer: Mother died at age 3 colon cancer. Father died of colon cancer at age 10, Paternal uncle x 2, paternal cousin Family History of Prostate Cancer: Father Family History of Colon Polyps:Father, Mother  Social History: Reviewed history from 02/07/2010 and  no changes required. Occupation: [employer] Married x 42 years Never Smoked-as a teenager Alcohol use-no Patient gets regular exercise.  Review of Systems       The patient complains of back pain, headaches-new, muscle pains/cramps, and sleeping problems.         The pertinent positives and negatives are noted as above and in the HPI. All other ROS were reviewed and were negative.   Vital Signs:  Patient profile:   65 year old male Height:      70 inches Weight:      175.2 pounds BMI:     25.23 Pulse rate:   88 / minute Pulse rhythm:   regular BP sitting:   138 / 78  (left arm) Cuff size:   regular  Vitals Entered By: Harlow Mares CMA Duncan Dull) (May 01, 2010 9:25 AM)  Physical Exam  General:  Well developed, well nourished, no acute distress. Head:  Normocephalic and atraumatic. Eyes:  PERRLA, no icterus. Mouth:  No deformity or lesions, dentition normal. Lungs:  Clear throughout to auscultation. Heart:  Regular rate and rhythm; no murmurs, rubs,  or bruits. Abdomen:  Soft, nontender and nondistended. No masses, hepatosplenomegaly or hernias noted. Normal bowel sounds. Psych:  Alert and cooperative. Normal mood and affect.  Impression & Recommendations:  Problem # 1:  ULCER-DUODENAL (ICD-532.9) Recent bleeding duodenal ulcer. He has an appointment with Dr. Michaell Cowing tomorrow with elective ulcer surgery scheduled for July 28.  Problem # 2:  OTHER OBSTRUCTION OF DUODENUM (ICD-537.3) Chronic duodenal stricture, secondary to recurrent ulcer disease. Continue pured diet until ulcer surgery. Plan as above.  Problem # 3:  BARRETTS ESOPHAGUS (ICD-530.85) Continue pantoprazole 40 mg twice daily, and standard antireflux measures. Surveillance endoscopy in 2013 at the time of his next colonoscopy.  Problem # 4:  FAMILY HX COLON CANCER (ICD-V16.0) Strong family history of colon cancer. Plan to change to a 5 year recall interval which will be in May 2013.  Problem # 5:  ACUTE  POSTHEMORRHAGIC ANEMIA (ICD-285.1) Hemoglobin on Friday, was 10.4. Continue iron replacement for one more month and then discontinue.  Patient Instructions: 1)  Pick up your prescription from your pharmacy.  2)  Please schedule a follow-up appointment in 6 months. 3)  Copy sent to : Sonda Primes, MD 4)                         Karie Soda, MD 5)  The medication list was reviewed and reconciled.  All changed / newly prescribed medications were explained.  A complete medication list was provided to the patient / caregiver.  Prescriptions: FERROUS SULFATE 325 (65 FE) MG TABS (FERROUS SULFATE) one tablet by mouth two times a day  #60 x 1   Entered by:   Christie Nottingham CMA (AAMA)   Authorized by:   Meryl Dare MD [facility]   Signed by:   Meryl Dare MD FACG on 05/01/2010   Method used:   Electronically to        [facility] (retail)       [address]       [place], [place]  [address]  Ph: [phone] or [phone]       Fax: [phone]   RxID:   [id number]   Appended Document: post hospital GI bleed/sheri    Clinical Lists Changes  Observations: Added new observation of EGD DUE: 03/2012 (05/01/2010 10:07)

## 2010-12-12 NOTE — Progress Notes (Signed)
Summary: Oxycodone  Phone Note Refill Request   Refills Requested: Medication #1:  OXYCODONE HCL 15 MG TABS 1 by mouth qid as needed pain. Please   Last Refilled: 03/22/2009 Initial call taken by: Beola Cord, CMA,  April 19, 2009 10:34 AM  Follow-up for Phone Call        done hardcopy to LIM side B - debra  Follow-up by: Corwin Levins MD,  April 19, 2009 5:46 PM  Additional Follow-up for Phone Call Additional follow up Details #1::        Patient notified to pick up Additional Follow-up by: Rock Nephew CMA,  April 20, 2009 9:40 AM    New/Updated Medications: OXYCODONE HCL 15 MG TABS (OXYCODONE HCL) 1 by mouth qid as needed pain. Please,  fill on or after  April 19, 2009   Prescriptions: OXYCODONE HCL 15 MG TABS (OXYCODONE HCL) 1 by mouth qid as needed pain. Please,  fill on or after  April 19, 2009  #120 x 0   Entered and Authorized by:   Corwin Levins MD   Signed by:   Corwin Levins MD on 04/19/2009   Method used:   Print then Give to Patient   RxID:   [id number]

## 2010-12-12 NOTE — Progress Notes (Signed)
Summary: LABS  Phone Note Call from Patient   Summary of Call: Pt has an apt 6/10, does he need labs prior?  Initial call taken by: Lamar Sprinkles,  April 15, 2008 9:17 AM  Follow-up for Phone Call        Come fasting (water and meds -OK) - we will do on the same day Follow-up by: Tresa Garter MD,  April 15, 2008 1:22 PM  Additional Follow-up for Phone Call Additional follow up Details #1::        Phone Call Completed, Provider Notified April 15, 2008 1:37 PM called pt to inform that labs will be done same day and he should come fasting pt made aware  Additional Follow-up by: Shelbie Proctor,  April 15, 2008 1:37 PM

## 2010-12-12 NOTE — Procedures (Signed)
Summary: Upper Endoscopy  Patient: Cody Frazier Note: All result statuses are Final unless otherwise noted.  Tests: (1) Upper Endoscopy (EGD)   EGD Upper Endoscopy       DONE     [facility]     [address]     [place], [place]  [address]           ENDOSCOPY PROCEDURE REPORT           PATIENT:  Cody Frazier, Cody Frazier  MR#:  [id number]     BIRTHDATE:  November 14, 1945, 63 yrs. old  GENDER:  male     ENDOSCOPIST:  Rachael Fee, MD     PROCEDURE DATE:  04/16/2010     PROCEDURE:  EGD for control of bleeding     ASA CLASS:  Class III     INDICATIONS:  hematemesis, melena, significant anemia (6-7 units     PRBC in past 24 hours)     MEDICATIONS:  Fentanyl 75 mcg IV, Versed 6 mg IV     TOPICAL ANESTHETIC:  none           DESCRIPTION OF PROCEDURE:   After the risks benefits and     alternatives of the procedure were thoroughly explained, informed     consent was obtained.  The  endoscope was introduced through the     mouth and advanced to the second portion of the duodenum, without     limitations.  The instrument was slowly withdrawn as the mucosa     was fully examined.     <<PROCEDUREIMAGES>>     There was a 2cm duodenal bulb ulcer with actively oozing, visible     vessel. This was difficult to stop. 7cc of dilute epinephrine was     injected, 7Fr Bicap (Gold probe) cautery was used and 5 endoclips     were placed. The last endoclip was clearly on the end of an oozing     vessel and acheived hemostasis (see image2, image4, image10,     image14, image21, and image23). Adjacent to the ulcer and slightly     distal, there was a focal duodenal bulb stricture that was able to     be passed with gastroscope with mild resistence (see image6).     Blood was found. There was a large amount of fresh red blood and     blood clot pooling in proximal stomach, precluded complete view of     stomach (see image7).    Retroflexed views revealed no     abnormalities.    The scope was  then withdrawn from the patient     and the procedure completed.           COMPLICATIONS:  None           ENDOSCOPIC IMPRESSION:     1) Actively oozing visible vessel in chronic duodenal bulb     ulcer; treated with epinephrine, cautery and endoclip placement     2) Duodenal bulb stricture adjacent to the ulcer           RECOMMENDATIONS:     Continue IV PPI gtts.     Observe clinically, transfuse blood products as needed.     I have discussed case with DR. Bertram Savin from general surgery     and she will see him in consultation.  Given the chronicity of     symptoms (both bleeding and stricturing) and the difficulty     acheiving  hemostasis during this procedure, I recommend surgical     resection if he has clear rebleeding.           ______________________________     Rachael Fee, MD           cc: Claudette Head, MD           n.     Rosalie Doctor:   Rachael Fee at 04/16/2010 09:36 AM           [facility], [id number]  Note: An exclamation mark (!) indicates a result that was not dispersed into the flowsheet. Document Creation Date: 04/16/2010 9:37 AM _______________________________________________________________________  (1) Order result status: Final Collection or observation date-time: 04/16/2010 09:04 Requested date-time:  Receipt date-time:  Reported date-time:  Referring Physician:   Ordering Physician: Rob Bunting [phone]) Specimen Source:  Source: Launa Grill Order Number: [id number] Lab site:

## 2010-12-12 NOTE — Assessment & Plan Note (Signed)
Summary: NAUSEA/HEADACHE/BODY ACHES/ SORE THROAT/ NWS  $50   Vital Signs:  Patient Profile:   65 Years Old Male Weight:      176 pounds O2 Sat:      99 % Temp:     98.4 degrees F oral Pulse rate:   64 / minute Pulse rhythm:   regular BP sitting:   112 / 86  (left arm) Cuff size:   regular  Vitals Entered By: Rock Nephew CMA (August 12, 2008 8:17 AM)                 Chief Complaint:  nausea, vomiting, stomach pain, and congestion.  History of Present Illness: this is a new patient to me complaining of the acute onset of abdominal pain 3 days ago. He describes the pain as being mostly in his epigastric region but also radiates into both lower quadrants. For the last 24 hours he has vomited 15 times and says the vomitus has turned to a chocolate brown color. He is also having diarrhea. He says the diarrhea is yellow without any blood or melena. He has had headache, eye pain, rib cage pain, fever to 100.4, chills, and a slight nonproductive cough. He doesn't have any pleuritic symptoms. he is not keeping down any liquids or food and has anorexia. He is becoming dizzy, weak, orthostatic and lethargic. his hemoglobin and hematocrit were 14.3 and 41.4 respectively on June 19 of this year. They were rechecked on September 21 of this year and hemoglobin and hematocrit had dropped to 12.2 and 36.4 and a red cell count had decreased to 3.97. His indices were normal.  Back Pain History:      The patient's back pain started approximately 05/03/2008.  The pain is located in the lower back region and does not radiate below the knees.  He states that he has had a prior history of back pain.    Dyspepsia History:      The patient has positive alarm features of dyspepsia which include hematemesis, persistent vomiting, and history of anemia.  There is a prior history of GERD.  He notes that there have been breakthrough symptoms despite maximum H-2 blocker or PPI therapy.  The patient has a prior  history of documented ulcer disease.  The dominant symptom is not heartburn or acid reflux.  An H-2 blocker medication is currently being taken.  He notes that the symptoms have not improved with the H-2 blocker therapy.  Symptoms have persisted after 4 weeks of H-2 blocker treatment.  He has no history of a positive H. Pylori serology.  A prior EGD has been done which showed moderate or severe esophagitis.        Prior Medication List:  RANITIDINE HCL 150 MG  CAPS (RANITIDINE HCL) once daily FLOMAX 0.4 MG  CP24 (TAMSULOSIN HCL) once daily CHROMAGEN FORTE 50-101-1 MG  TABS (FEASP-FEFUM -SUC-C-THRE-B12-FA) once daily PERCOCET 10-325 MG  TABS (OXYCODONE-ACETAMINOPHEN) 1po qid as needed for pain Please,  fill on or after 07/22/08 SIMVASTATIN 80 MG TABS (SIMVASTATIN) 1 once daily po FLONASE 50 MCG/ACT SUSP (FLUTICASONE PROPIONATE) 2 sprays each day ALLEGRA 180 MG TABS (FEXOFENADINE HCL) 1 by mouth once daily as needed allergies FOSINOPRIL SODIUM-HCTZ 20-12.5 MG  TABS (FOSINOPRIL SODIUM-HCTZ) 1 by mouth qam VITAMIN D3 1000 UNIT  TABS (CHOLECALCIFEROL) 1 qd PROTONIX 40 MG  TBEC (PANTOPRAZOLE SODIUM) once daily   Updated Prior Medication List: RANITIDINE HCL 150 MG  CAPS (RANITIDINE HCL) once daily FLOMAX 0.4 MG  CP24 (TAMSULOSIN HCL) once daily CHROMAGEN FORTE 50-101-1 MG  TABS (FEASP-FEFUM -SUC-C-THRE-B12-FA) once daily PERCOCET 10-325 MG  TABS (OXYCODONE-ACETAMINOPHEN) 1po qid as needed for pain Please,  fill on or after 07/22/08 SIMVASTATIN 80 MG TABS (SIMVASTATIN) 1 once daily po FLONASE 50 MCG/ACT SUSP (FLUTICASONE PROPIONATE) 2 sprays each day ALLEGRA 180 MG TABS (FEXOFENADINE HCL) 1 by mouth once daily as needed allergies FOSINOPRIL SODIUM-HCTZ 20-12.5 MG  TABS (FOSINOPRIL SODIUM-HCTZ) 1 by mouth qam VITAMIN D3 1000 UNIT  TABS (CHOLECALCIFEROL) 1 qd PROTONIX 40 MG  TBEC (PANTOPRAZOLE SODIUM) once daily  Current Allergies (reviewed today): ! CODEINE PHOSPHATE (CODEINE PHOSPHATE)  Past  Surgical History:    BACK SURGERY 02/2003 ON L5    Fusion LS 2009 Dr Jeral Fruit    He is status post repair of hiatal hernia    He has had an appendectomy   Family History:    Reviewed history from 09/11/2007 and no changes required:       Family History Hypertension  Social History:    Reviewed history from 09/11/2007 and no changes required:       Occupation: [employer]       Married       Never Smoked       Alcohol use-no       Regular exercise-no   Risk Factors:  Tobacco use:  never Passive smoke exposure:  no Drug use:  no HIV high-risk behavior:  no Alcohol use:  no Exercise:  no  Family History Risk Factors:    Family History of MI in females < 20 years old:  no    Family History of MI in males < 76 years old:  no   Review of Systems  CV      Complains of chest pain or discomfort, fatigue, and lightheadness.      Denies bluish discoloration of lips or nails, difficulty breathing at night, difficulty breathing while lying down, fainting, leg cramps with exertion, near fainting, palpitations, shortness of breath with exertion, swelling of feet, swelling of hands, and weight gain.  GI      Complains of change in bowel habits, diarrhea, indigestion, loss of appetite, nausea, vomiting, and vomiting blood.      Denies constipation, dark tarry stools, hemorrhoids, and yellowish skin color.   Physical Exam  General:     he is pale, lethargic, moves slowly, skin is cool and clammy Eyes:     no icterus Mouth:     pink, moist mucous membranes Neck:     supple, full ROM, and no masses.   Lungs:     Normal respiratory effort, chest expands symmetrically. Lungs are clear to auscultation, no crackles or wheezes. Heart:     Normal rate and regular rhythm. S1 and S2 normal without gallop, murmur, click, rub or other extra sounds. Abdomen:     he has hypoactive bowel sounds. He is tender to palpation in the right lower quadrant. There is no rebound or guarding. He has a  large midline scar in the epigastrium and a scar in the right lower quadrant.no distention, no masses, no guarding, no rigidity, no rebound tenderness, no abdominal hernia, no inguinal hernia, no hepatomegaly, no splenomegaly, abdominal scar(s), and bowel sounds absent.   Rectal:     no external abnormalities, no hemorrhoids, normal sphincter tone, no masses, no tenderness, no fissures, no fistulae, no perianal rash, and stool positive for occult blood.   Genitalia:     circumcised, no scrotal masses, no testicular masses  or atrophy, no cutaneous lesions, and no urethral discharge.   Prostate:     no gland enlargement, no nodules, and no asymmetry.   Msk:     normal ROM.   Neurologic:     alert & oriented X3, cranial nerves II-XII intact, strength normal in all extremities, and DTRs symmetrical and normal.   Skin:     decreased turgor and pale.   Psych:     Oriented X3, not anxious appearing, flat affect, subdued, withdrawn, and poor eye contact.      Impression & Recommendations:  Problem # 1:  BLOOD IN STOOL (ICD-578.1) Assessment: New I have admitted him to the hospital and have written orders. I spoke to Amy from the GI service and he will be seen by GI for possible repeat endoscopy. I spoke to the hospital medical team from [facility]  and they will follow him while he is in the hospital.  Problem # 2:  GASTROENTERITIS (ICD-558.9) Assessment: New his physical exam and complaints of orthostatic symptoms concerns me that in addition to the anemia he is becoming dehydrated. He'll need to be admitted to the hospital for IV fluids.  Problem # 3:  ABDOMINAL PAIN, EPIGASTRIC (ICD-789.06) Assessment: New  Problem # 4:  ANEMIA-IRON DEFICIENCY (ICD-280.9) Assessment: Deteriorated this appears to be worsening and I suspect that there is a GI blood loss. His updated medication list for this problem includes:    Chromagen Forte 50-101-1 Mg Tabs (Feasp-fefum -suc-c-thre-b12-fa) ..... Once  daily   Problem # 5:  CHEST PAIN UNSPECIFIED (ICD-786.50) Assessment: New his EKG shows sinus tachycardia consistent with blood loss and dehydration. There are no acute ST or T wave changes. I think his chest pain is musculoskeletal in nature. Orders: EKG w/ Interpretation (93000)   Complete Medication List: 1)  Ranitidine Hcl 150 Mg Caps (Ranitidine hcl) .... Once daily 2)  Flomax 0.4 Mg Cp24 (Tamsulosin hcl) .... Once daily 3)  Chromagen Forte 50-101-1 Mg Tabs (Feasp-fefum -suc-c-thre-b12-fa) .... Once daily 4)  Percocet 10-325 Mg Tabs (Oxycodone-acetaminophen) .Marland Kitchen.. 1po qid as needed for pain please,  fill on or after 07/22/08 5)  Simvastatin 80 Mg Tabs (Simvastatin) .Marland Kitchen.. 1 once daily po 6)  Flonase 50 Mcg/act Susp (Fluticasone propionate) .... 2 sprays each day 7)  Allegra 180 Mg Tabs (Fexofenadine hcl) .Marland Kitchen.. 1 by mouth once daily as needed allergies 8)  Fosinopril Sodium-hctz 20-12.5 Mg Tabs (Fosinopril sodium-hctz) .Marland Kitchen.. 1 by mouth qam 9)  Vitamin D3 1000 Unit Tabs (Cholecalciferol) .Marland Kitchen.. 1 qd 10)  Protonix 40 Mg Tbec (Pantoprazole sodium) .... Once daily  Other Orders: TLB-BMP (Basic Metabolic Panel-BMET) (80048-METABOL) TLB-CBC Platelet - w/Differential (85025-CBCD) TLB-Hepatic/Liver Function Pnl (80076-HEPATIC) TLB-Amylase (82150-AMYL) TLB-Udip w/ Micro (81001-URINE) T-Acute Abdomen (2 view w/ PA & Chest (78295AO)    ]  Appended Document: NAUSEA/HEADACHE/BODY ACHES/ SORE THROAT/ NWS  $50 As billing provider, I have reviewed this document.

## 2010-12-12 NOTE — Letter (Signed)
Summary: [facility]   Imported By: Sherian Rein 08/22/2010 14:00:12  _____________________________________________________________________  External Attachment:    Type:   Image     Comment:   External Document

## 2010-12-12 NOTE — Progress Notes (Signed)
Summary: Protonix Refill   Phone Note Call from Patient Call back at [phone]   Call For: Dr Russella Dar Reason for Call: Refill Medication Summary of Call: Has been out of his Protonix x2days. Is afraid is ulcer is going to start bleeding again. [facility]es[facility]. Says this is the second time he calls with request although I dont see a prior request and he also says pharmacy has been calling  for him.pt would also like to know what the average iron level should be on a pt his age Initial call taken by: Leanor Kail [facility],  Mar 23, 2009 9:23 AM  Follow-up for Phone Call        Rx was sent to pts pharmacy and pt notified that it was sent. Pt wants to know if he should keep taking his Iron. I told pt to resume iron.  Follow-up by: Christie Nottingham CMA,  Mar 23, 2009 10:15 AM      Prescriptions: PROTONIX 40 MG  TBEC (PANTOPRAZOLE SODIUM) Take 1 tablet by mouth two times a day  #60 x 11   Entered by:   Christie Nottingham CMA   Authorized by:   Meryl Dare MD [facility]   Signed by:   Christie Nottingham CMA on 03/23/2009   Method used:   Electronically to        [facility]* (retail)       [address]       [place], [place]  [address]       Ph: [phone]       Fax: [phone]   RxID:   [id number]   Appended Document: Protonix Refill    Clinical Lists Changes  Medications: Rx of PROTONIX 40 MG  TBEC (PANTOPRAZOLE SODIUM) Take 1 tablet by mouth two times a day;  #60 x 11;  Signed;  Entered by: Christie Nottingham CMA;  Authorized by: Meryl Dare MD [facility];  Method used: [facility] [address] [id number] [address], [place], [place]  [address], Ph: [phone] or [phone], Fax: [phone]    Prescriptions: PROTONIX 40 MG  TBEC (PANTOPRAZOLE SODIUM) Take 1 tablet by mouth two times a day  #60 x 11   Entered by:   Christie Nottingham CMA   Authorized by:   Meryl Dare MD [facility]   Signed by:   Christie Nottingham CMA on 03/23/2009   Method  used:   [facility]ronically to        [facility] ([facility]ail[facility]       [address]       [place], [place]  [address]       Ph: [phone] or [phone]       Fax: [phone]   RxID:   [id number]

## 2010-12-12 NOTE — Progress Notes (Signed)
Summary: labs  Phone Note Call from Patient Call back at Home Phone [phone]   Summary of Call: Patient is requesting labs before 12/10 apt Initial call taken by: Lamar Sprinkles,  October 08, 2007 11:27 AM  Follow-up for Phone Call        OK lipids, LFT, CBC, UA  789.00 272.0 995.2 Follow-up by: Tresa Garter MD,  October 08, 2007 1:19 PM  Additional Follow-up for Phone Call Additional follow up Details #1::        Labs in compter Additional Follow-up by: Lamar Sprinkles,  October 10, 2007 8:00 AM    Additional Follow-up for Phone Call Additional follow up Details #2::    Lf mess for pt Follow-up by: Lamar Sprinkles,  October 10, 2007 9:47 AM

## 2010-12-12 NOTE — Progress Notes (Signed)
Summary: Vomitting every half hour   Phone Note Call from Patient Call back at [phone]   Call For: Dr Russella Dar Reason for Call: Talk to Nurse Summary of Call: Has been throwing up every half hour all night long. Feels weak and would like to be seen today. Initial call taken by: Leanor Kail [facility],  June 29, 2009 8:21 AM  Follow-up for Phone Call        To ER pls ASAP Follow-up by: Tresa Garter MD,  June 29, 2009 8:37 AM  Additional Follow-up for Phone Call Additional follow up Details #1::        Pt. aware. Additional Follow-up by: Beola Cord, CMA,  June 29, 2009 8:59 AM

## 2010-12-13 ENCOUNTER — Encounter: Payer: Self-pay | Admitting: Surgery

## 2010-12-14 NOTE — Letter (Signed)
Summary: Office Visit Letter  [facility]  [address] [place], [place] [address]   Phone: [phone]  Fax: [phone]      October 24, 2010 MRN: [id number]   Cody Frazier [address] [place], [place]  [address]   Dear [name],   According to our records, it is time for you to schedule a follow-up office visit with [place].   At your convenience, please call [phone] (option #2)to schedule an office visit. If you have any questions, concerns, or feel that this letter is in error, we would appreciate your call.   Sincerely,    Judie Petit T. Russella Dar, M.D.  [facility] [phone]

## 2010-12-21 ENCOUNTER — Ambulatory Visit (INDEPENDENT_AMBULATORY_CARE_PROVIDER_SITE_OTHER): Payer: BC Managed Care – PPO | Admitting: Gastroenterology

## 2010-12-21 ENCOUNTER — Encounter: Payer: Self-pay | Admitting: Gastroenterology

## 2010-12-21 DIAGNOSIS — K3184 Gastroparesis: Secondary | ICD-10-CM

## 2010-12-27 ENCOUNTER — Telehealth: Payer: Self-pay | Admitting: Internal Medicine

## 2010-12-28 NOTE — Discharge Summary (Signed)
Summary: Persistent chronic duodenal bulb ulcer    NAME:  Cody Frazier, Cody Frazier               ACCOUNT NO.:  [id number]      MEDICAL RECORD NO.:  [id number]          PATIENT TYPE:  INP      LOCATION:  1414                         FACILITY:  [facility]      PHYSICIAN:  Almond Lint, MD       DATE OF BIRTH:  10-25-1946      DATE OF ADMISSION:  06/08/2010   DATE OF DISCHARGE:  06/15/2010                                  DISCHARGE SUMMARY      PRINCIPAL DIAGNOSIS:  Persistent chronic duodenal bulb ulcer with   stricture and recurrent massive bleeding.      PRINCIPAL PROCEDURE:  Exploratory laparotomy with lysis of adhesions,   truncal anterior and posterior vagotomy, antrectomy, duodenal bulb   resection with Billroth II anastomosis, and 24-French feeding GJ tube.      DISCHARGE MEDICATIONS:   1. Oxycodone elixir.   2. Flonase nasal spray.   3. Protonix 40 mg twice a day.   4. Lopressor 50 mg p.o. twice a day.   5. Phenergan as directed at home.   6. Ambien 5 mg p.o. nightly p.r.n. insomnia.   7. Percocet 5/325 mg 1-2 tablets p.o. q.4 h. p.r.n. pain.   8. Reglan 10 mg p.o. b.i.d. p.r.n. nausea, vomiting.      HOSPITAL COURSE:  [name] was admitted to step down, following his   procedure as described above.  He had good urine output and stable   hematocrit.  He was sent to the floor after surgery.  Pain was controlled with   PCA.  He was n.p.o. with ice chips and had NG tube in place.  His NG   tube was removed over the weekend postoperatively and he developed   atrial flutter up to 174.  He was transferred to the step-down unit and   placed on a diltiazem drip.  He had also been on IV metoprolol.  He was   transitioned over to oral metoprolol and given Reglan for motility.  He   had tube feeds started through his J tube.  Over the next few days, he   developed diarrhea from the tube feedings and so these were stopped as   his diet was improving.  He was transitioned over to oral metoprolol  and   IV diltiazem drip was discontinued.  His PCA was discontinued and he was   tried on oral Dilaudid, however, this was too strong and made him feel   ill.  He was placed on Percocet.  He was discharged to home in good   condition.  His Jackson-Pratt drain and serosanguineous and the Foley   removed.  His GJ tube will remain in place, but he is not going to   receive feedings.  We will teach his wife how to flush his tubes and   have home health check up on him to better evaluate that.  He will   follow with Dr. Michaell Cowing in 2 weeks.  Almond Lint, MD            FB/MEDQ  D:  06/15/2010  T:  06/16/2010  Job:  [id number]      Electronically Signed by Almond Lint MD on 06/21/2010 06:07:25 PM

## 2010-12-28 NOTE — Assessment & Plan Note (Signed)
Summary: F.U ..EM [facility] ALICIA MCARE-BCBS-INS COPAY AND CX FEE ADVISED   Vital Signs:  Patient profile:   65 year old male Height:      70 inches Weight:      173 pounds BMI:     24.91 BSA:     1.96 Pulse rate:   76 / minute Pulse rhythm:   regular BP sitting:   136 / 88  (left arm)  Vitals Entered By: Lamona Curl CMA Duncan Dull) (December 21, 2010 11:20 AM)  History of Present Illness Visit Type: Follow-up Consult Primary GI MD: Elie Goody MD [facility] Primary Provider: Sonda Primes, MD Requesting Provider: Sonda Primes, MD Chief Complaint: Patient here for f/u duodenal ulcer. Patient c/o a feeling of "gas" in the stomach after eating. History of Present Illness:   Mr. Steeves, returns today with a "gas" in his epigastric area. He notes a fullness, bloating, and belching after meals. A recent upper GI series showed normal postoperative anatomy. A recent gastric emptying scan since 97% emptying delay at 2 hours.   GI Review of Systems    Reports abdominal pain, belching, and  bloating.     Location of  Abdominal pain: epigastric area.    Denies acid reflux, chest pain, dysphagia with liquids, dysphagia with solids, heartburn, loss of appetite, nausea, vomiting, vomiting blood, weight loss, and  weight gain.      Reports diverticulosis.     Denies anal fissure, black tarry stools, change in bowel habit, constipation, diarrhea, fecal incontinence, heme positive stool, hemorrhoids, irritable bowel syndrome, jaundice, light color stool, liver problems, rectal bleeding, and  rectal pain.  Current Medications (verified): 1)  Oxycodone Hcl 15 Mg Tabs (Oxycodone Hcl) .Marland Kitchen.. 1 By Mouth Qid As Needed Pain. Please,  Fill On or After   11/26/10         . Ok To Fill 1-2 Day Earlier When The Month Has 31 Days in It. 2)  Protonix 40 Mg  Tbec (Pantoprazole Sodium) .... Take 1 Tablet By Mouth Once Daily 3)  Gaviscon 80-14.2 Mg Chew (Alum Hydroxide-Mag Trisilicate) .... 2 By Mouth As  Needed 4)  Vitamin D3 1000 Unit  Tabs (Cholecalciferol) .... 2  By Mouth Daily 5)  Promethazine Hcl 25 Mg  Tabs (Promethazine Hcl) .Marland Kitchen.. 1 By Mouth Two Times A Day As Needed Nausea 6)  Lomotil 2.5-0.025 Mg Tabs (Diphenoxylate-Atropine) .Marland Kitchen.. 1-2 By Mouth Two Times A Day As Needed Diarrhea 7)  Flonase 50 Mcg/act Susp (Fluticasone Propionate) .... 2 Sprays Each Nostril 8)  Bifera 28 Mg Tabs (Polysacch Fe Cmp-Fe Heme Poly) .... Once Daily 9)  Cozaar 50 Mg Tabs (Losartan Potassium) .... Take 1 Tab By Mouth Every Day 10)  Flonase 50 Mcg/act Susp (Fluticasone Propionate) .Marland Kitchen.. 1 Spr Each Nostr Qd As Needed 11)  Loratadine 10 Mg Tabs (Loratadine) .Marland Kitchen.. 1 By Mouth Once Daily As Needed Allergies 12)  Centrum Silver  Tabs (Multiple Vitamins-Minerals) .Marland Kitchen.. 1 By Mouth Qd 13)  Ferrous Sulfate 325 (65 Fe) Mg Tabs (Ferrous Sulfate) .... One Tablet By Mouth Two Times A Day 14)  Flora-Q  Caps (Probiotic Product) .Marland Kitchen.. 1 By Mouth Bid 15)  Sucralfate 1 Gm Tabs (Sucralfate) .Marland Kitchen.. 1 By Mouth Bid 16)  Psyllium Fiber Packet .Marland Kitchen.. 1 Pack By Mouth Bid 17)  Metoclopramide Hcl 10 Mg Tabs (Metoclopramide Hcl) .Marland Kitchen.. 1 By Mouth Three Times A Day Ac Prn 18)  Buspirone Hcl 15 Mg Tabs (Buspirone Hcl) .... Start With 1/2 Tab Two Times A  Day Then 1 By Mouth Two Times A Day For Anxiety  Allergies (verified): 1)  ! Codeine Phosphate (Codeine Phosphate) 2)  Acetaminophen (Acetaminophen)  Past History:  Past Medical History: Reviewed history from 03/01/2009 and no changes required. Anemia-iron deficiency Hyperplastic colonic polyps, hx of 01/2000 Diverticulosis, colon Hypertension Low back pain, chronic Nephrolithiasis, hx of  B GERD Peptic ulcer disease with bleeding and GOO, H. pylori Ab negative, 08/2008 Benign prostatic hypertrophy Allergic rhinitis Degenerative disk disease Hyperlipidemia Atrial arrhythmia Renal cyst Duodenal stricture with gastric outlet obstruction Hemorrhoids Barretts Esophagus without  dysplasia  Past Surgical History: BACK SURGERY 02/2003 ON L5 Fusion LS 2009 Dr Jeral Fruit status post repair of hiatal hernia repair appendectomy BILROTH I  2011 Dr Michaell Cowing  Family History: Reviewed history from 10/11/2008 and no changes required. Family History Hypertension Family History of Colon Cancer: Mother died at age 81 colon cancer. Father died of colon cancer at age 64, Paternal uncle x 2, paternal cousin Family History of Prostate Cancer: Father Family History of Colon Polyps:Father, Mother  Social History: Reviewed history from 02/07/2010 and no changes required. Occupation: Reverend Married x 42 years Never Smoked-as a teenager Alcohol use-no Patient gets regular exercise.  Review of Systems       The patient complains of anxiety-new, arthritis/joint pain, back pain, muscle pains/cramps, night sweats, and sleeping problems.         The pertinent positives and negatives are noted as above and in the HPI. All other ROS were reviewed and were negative.   Physical Exam  General:  Well developed, well nourished, no acute distress. Head:  Normocephalic and atraumatic. Eyes:  PERRLA, no icterus. Mouth:  No deformity or lesions, dentition normal. Lungs:  Clear throughout to auscultation. Heart:  Regular rate and rhythm; no murmurs, rubs,  or bruits. Abdomen:  Soft, nontender and nondistended. No masses, hepatosplenomegaly or hernias noted. Normal bowel sounds. Psych:  Alert and cooperative. Normal mood and affect.  Impression & Recommendations:  Problem # 1:  GASTROPARESIS (ICD-536.3) Change metronidazole to 5 mg a.c. and h.s. Related drowsiness taking 10 mg. Continue to use Gas-X and Beano. I advised 5-6 small, low fat, low fiber, meals each day for long term management.  Problem # 2:  BARRETTS ESOPHAGUS (ICD-530.85) Continue standard antireflux measures and Protonix 40 mg q.a.m. long term. Surveillance endoscopy January 2013.  Patient Instructions: 1)  Take  metoclopramide 5mg  one tablet by mouth before meals and at bedtime. A prescription has been sent to your pharmacy.  2)  Please schedule a follow-up appointment in 6 months. 3)  Copy sent to : Estelle Grumbles, MD 4)  The medication list was reviewed and reconciled.  All changed / newly prescribed medications were explained.  A complete medication list was provided to the patient / caregiver.  Prescriptions: METOCLOPRAMIDE HCL 5 MG TABS (METOCLOPRAMIDE HCL) one tablet by mouth before meals and at bedtime  #120 x 5   Entered by:   Christie Nottingham CMA (AAMA)   Authorized by:   Meryl Dare MD [facility]   Signed by:   Christie Nottingham CMA (AAMA) on 12/21/2010   Method used:   [facility]       [address]       [place], [place]  [address]       Ph: [phone] or [phone]       Fax: [phone]   RxID:   [id number]

## 2011-01-03 ENCOUNTER — Encounter: Payer: Self-pay | Admitting: Gastroenterology

## 2011-01-03 NOTE — Letter (Addendum)
Summary: [facility]   Imported By: Sherian Rein 12/26/2010 11:48:37  _____________________________________________________________________  External Attachment:    Type:   Image     Comment:   External Document

## 2011-01-03 NOTE — Progress Notes (Signed)
Summary: REFILL - Plot pt  Phone Note Call from Patient Call back at Home Phone [phone]   Caller: Patient Call For: Cody Garter MD Summary of Call: pt requesting refill oxycodone x120 for back pain.  Initial call taken by: Verdell Face,  December 27, 2010 8:18 AM  Follow-up for Phone Call        [facility] for refill? pt is due now Follow-up by: Lamar Sprinkles, CMA,  December 27, 2010 9:46 AM  Additional Follow-up for Phone Call Additional follow up Details #1::        OK for refill Additional Follow-up by: Jacques Navy MD,  December 27, 2010 1:05 PM    Additional Follow-up for Phone Call Additional follow up Details #2::    Pt informed that rx will be ready for pick up tomorrow.  Follow-up by: Lamar Sprinkles, CMA,  December 27, 2010 6:44 PM  New/Updated Medications: OXYCODONE HCL 15 MG TABS (OXYCODONE HCL) 1 by mouth qid as needed pain. Please,  fill on or after   12/27/10         . OK to fill 1-2 day earlier when the month has 31 days in it. Prescriptions: OXYCODONE HCL 15 MG TABS (OXYCODONE HCL) 1 by mouth qid as needed pain. Please,  fill on or after   12/27/10         . OK to fill 1-2 day earlier when the month has 31 days in it.  #120 x 0   Entered by:   Lamar Sprinkles, CMA   Authorized by:   Jacques Navy MD   Signed by:   Lamar Sprinkles, CMA on 12/27/2010   Method used:   Print then Give to Patient   RxID:   [id number]

## 2011-01-22 ENCOUNTER — Telehealth: Payer: Self-pay | Admitting: Internal Medicine

## 2011-01-23 NOTE — Letter (Addendum)
Summary: [facility]   Imported By: Lennie Odor 01/19/2011 11:46:28  _____________________________________________________________________  External Attachment:    Type:   Image     Comment:   External Document

## 2011-01-26 LAB — CBC
HCT: 29.2 % — ABNORMAL LOW (ref 39.0–52.0)
HCT: 30.1 % — ABNORMAL LOW (ref 39.0–52.0)
HCT: 30.6 % — ABNORMAL LOW (ref 39.0–52.0)
HCT: 31.7 % — ABNORMAL LOW (ref 39.0–52.0)
Hemoglobin: 10 g/dL — ABNORMAL LOW (ref 13.0–17.0)
Hemoglobin: 10.2 g/dL — ABNORMAL LOW (ref 13.0–17.0)
Hemoglobin: 10.6 g/dL — ABNORMAL LOW (ref 13.0–17.0)
Hemoglobin: 10.8 g/dL — ABNORMAL LOW (ref 13.0–17.0)
MCH: 30.5 pg (ref 26.0–34.0)
MCH: 30.7 pg (ref 26.0–34.0)
MCH: 31.1 pg (ref 26.0–34.0)
MCH: 31.1 pg (ref 26.0–34.0)
MCHC: 33.8 g/dL (ref 30.0–36.0)
MCHC: 34 g/dL (ref 30.0–36.0)
MCHC: 34.1 g/dL (ref 30.0–36.0)
MCHC: 34.5 g/dL (ref 30.0–36.0)
MCV: 89.9 fL (ref 78.0–100.0)
MCV: 90.2 fL (ref 78.0–100.0)
MCV: 90.5 fL (ref 78.0–100.0)
MCV: 91 fL (ref 78.0–100.0)
Platelets: 267 10*3/uL (ref 150–400)
Platelets: 364 10*3/uL (ref 150–400)
Platelets: 384 10*3/uL (ref 150–400)
Platelets: 466 10*3/uL — ABNORMAL HIGH (ref 150–400)
RBC: 3.21 MIL/uL — ABNORMAL LOW (ref 4.22–5.81)
RBC: 3.34 MIL/uL — ABNORMAL LOW (ref 4.22–5.81)
RBC: 3.41 MIL/uL — ABNORMAL LOW (ref 4.22–5.81)
RBC: 3.5 MIL/uL — ABNORMAL LOW (ref 4.22–5.81)
RDW: 13.2 % (ref 11.5–15.5)
RDW: 13.3 % (ref 11.5–15.5)
RDW: 13.4 % (ref 11.5–15.5)
RDW: 13.6 % (ref 11.5–15.5)
WBC: 7 10*3/uL (ref 4.0–10.5)
WBC: 7.3 10*3/uL (ref 4.0–10.5)
WBC: 7.7 10*3/uL (ref 4.0–10.5)
WBC: 8.4 10*3/uL (ref 4.0–10.5)

## 2011-01-26 LAB — BASIC METABOLIC PANEL
BUN: 7 mg/dL (ref 6–23)
BUN: 8 mg/dL (ref 6–23)
CO2: 26 mEq/L (ref 19–32)
CO2: 30 mEq/L (ref 19–32)
Calcium: 8.9 mg/dL (ref 8.4–10.5)
Calcium: 9.2 mg/dL (ref 8.4–10.5)
Chloride: 103 mEq/L (ref 96–112)
Chloride: 99 mEq/L (ref 96–112)
Creatinine, Ser: 0.75 mg/dL (ref 0.4–1.5)
Creatinine, Ser: 0.94 mg/dL (ref 0.4–1.5)
GFR calc Af Amer: >60 mL/min (ref >60)
GFR calc Af Amer: >60 mL/min (ref >60)
GFR calc non Af Amer: >60 mL/min (ref >60)
GFR calc non Af Amer: >60 mL/min (ref >60)
Glucose, Bld: 119 mg/dL — ABNORMAL HIGH (ref 70–99)
Glucose, Bld: 131 mg/dL — ABNORMAL HIGH (ref 70–99)
Potassium: 3.3 mEq/L — ABNORMAL LOW (ref 3.5–5.1)
Potassium: 3.5 mEq/L (ref 3.5–5.1)
Sodium: 137 mEq/L (ref 135–145)
Sodium: 139 mEq/L (ref 135–145)

## 2011-01-26 LAB — CARDIAC PANEL(CRET KIN+CKTOT+MB+TROPI)
CK, MB: 0.8 ng/mL (ref 0.3–4.0)
CK, MB: 1.2 ng/mL (ref 0.3–4.0)
CK, MB: 1.2 ng/mL (ref 0.3–4.0)
Relative Index: 0.8 (ref 0.0–2.5)
Relative Index: 1 (ref 0.0–2.5)
Relative Index: INVALID (ref 0.0–2.5)
Total CK: 125 U/L (ref 7–232)
Total CK: 159 U/L (ref 7–232)
Total CK: 81 U/L (ref 7–232)
Troponin I: 0.02 ng/mL (ref 0.00–0.06)
Troponin I: 0.03 ng/mL (ref 0.00–0.06)
Troponin I: 0.03 ng/mL (ref 0.00–0.06)

## 2011-01-26 LAB — MRSA PCR SCREENING: MRSA by PCR: NEGATIVE

## 2011-01-26 LAB — PHOSPHORUS: Phosphorus: 3.2 mg/dL (ref 2.3–4.6)

## 2011-01-26 LAB — MAGNESIUM: Magnesium: 2 mg/dL (ref 1.5–2.5)

## 2011-01-27 LAB — CBC
HCT: 34.3 % — ABNORMAL LOW (ref 39.0–52.0)
HCT: 40.3 % (ref 39.0–52.0)
Hemoglobin: 11.6 g/dL — ABNORMAL LOW (ref 13.0–17.0)
Hemoglobin: 13.6 g/dL (ref 13.0–17.0)
MCH: 30.7 pg (ref 26.0–34.0)
MCH: 31 pg (ref 26.0–34.0)
MCHC: 33.7 g/dL (ref 30.0–36.0)
MCHC: 33.8 g/dL (ref 30.0–36.0)
MCV: 91.2 fL (ref 78.0–100.0)
MCV: 91.6 fL (ref 78.0–100.0)
Platelets: 253 10*3/uL (ref 150–400)
Platelets: 287 10*3/uL (ref 150–400)
RBC: 3.74 MIL/uL — ABNORMAL LOW (ref 4.22–5.81)
RBC: 4.43 MIL/uL (ref 4.22–5.81)
RDW: 13.3 % (ref 11.5–15.5)
RDW: 13.4 % (ref 11.5–15.5)
WBC: 14.1 10*3/uL — ABNORMAL HIGH (ref 4.0–10.5)
WBC: 4.6 10*3/uL (ref 4.0–10.5)

## 2011-01-27 LAB — CREATININE, SERUM
Creatinine, Ser: 1.04 mg/dL (ref 0.4–1.5)
GFR calc Af Amer: >60 mL/min (ref >60)
GFR calc non Af Amer: >60 mL/min (ref >60)

## 2011-01-27 LAB — BASIC METABOLIC PANEL
BUN: 11 mg/dL (ref 6–23)
CO2: 27 mEq/L (ref 19–32)
Calcium: 8.2 mg/dL — ABNORMAL LOW (ref 8.4–10.5)
Chloride: 103 mEq/L (ref 96–112)
Creatinine, Ser: 0.98 mg/dL (ref 0.4–1.5)
GFR calc Af Amer: >60 mL/min (ref >60)
GFR calc non Af Amer: >60 mL/min (ref >60)
Glucose, Bld: 158 mg/dL — ABNORMAL HIGH (ref 70–99)
Potassium: 4.2 mEq/L (ref 3.5–5.1)
Sodium: 135 mEq/L (ref 135–145)

## 2011-01-27 LAB — TYPE AND SCREEN
ABO/RH(D): O POS
Antibody Screen: NEGATIVE

## 2011-01-27 LAB — IRON: Iron: <10 ug/dL — ABNORMAL LOW (ref 42–135)

## 2011-01-27 LAB — HEMOGLOBIN: Hemoglobin: 9.4 g/dL — ABNORMAL LOW (ref 13.0–17.0)

## 2011-01-27 LAB — SURGICAL PCR SCREEN
MRSA, PCR: NEGATIVE
Staphylococcus aureus: NEGATIVE

## 2011-01-29 LAB — CBC
HCT: 18.7 % — ABNORMAL LOW (ref 39.0–52.0)
HCT: 21.7 % — ABNORMAL LOW (ref 39.0–52.0)
HCT: 22.5 % — ABNORMAL LOW (ref 39.0–52.0)
HCT: 23.9 % — ABNORMAL LOW (ref 39.0–52.0)
HCT: 24.1 % — ABNORMAL LOW (ref 39.0–52.0)
HCT: 24.5 % — ABNORMAL LOW (ref 39.0–52.0)
HCT: 25.4 % — ABNORMAL LOW (ref 39.0–52.0)
HCT: 29.5 % — ABNORMAL LOW (ref 39.0–52.0)
HCT: 30 % — ABNORMAL LOW (ref 39.0–52.0)
HCT: 43.8 % (ref 39.0–52.0)
Hemoglobin: 10.4 g/dL — ABNORMAL LOW (ref 13.0–17.0)
Hemoglobin: 10.5 g/dL — ABNORMAL LOW (ref 13.0–17.0)
Hemoglobin: 6.4 g/dL — CL (ref 13.0–17.0)
Hemoglobin: 7.7 g/dL — ABNORMAL LOW (ref 13.0–17.0)
Hemoglobin: 7.8 g/dL — ABNORMAL LOW (ref 13.0–17.0)
Hemoglobin: 8.2 g/dL — ABNORMAL LOW (ref 13.0–17.0)
Hemoglobin: 8.3 g/dL — ABNORMAL LOW (ref 13.0–17.0)
Hemoglobin: 8.5 g/dL — ABNORMAL LOW (ref 13.0–17.0)
Hemoglobin: 8.6 g/dL — ABNORMAL LOW (ref 13.0–17.0)
Hemoglobin: 14.8 g/dL (ref 13.0–17.0)
MCHC: 33.9 g/dL (ref 30.0–36.0)
MCHC: 34 g/dL (ref 30.0–36.0)
MCHC: 34.4 g/dL (ref 30.0–36.0)
MCHC: 34.5 g/dL (ref 30.0–36.0)
MCHC: 34.6 g/dL (ref 30.0–36.0)
MCHC: 34.6 g/dL (ref 30.0–36.0)
MCHC: 34.8 g/dL (ref 30.0–36.0)
MCHC: 35.4 g/dL (ref 30.0–36.0)
MCHC: 35.4 g/dL (ref 30.0–36.0)
MCHC: 33.8 g/dL (ref 30.0–36.0)
MCV: 91 fL (ref 78.0–100.0)
MCV: 91.4 fL (ref 78.0–100.0)
MCV: 91.4 fL (ref 78.0–100.0)
MCV: 91.5 fL (ref 78.0–100.0)
MCV: 92.2 fL (ref 78.0–100.0)
MCV: 92.3 fL (ref 78.0–100.0)
MCV: 93.1 fL (ref 78.0–100.0)
MCV: 93.5 fL (ref 78.0–100.0)
MCV: 93.9 fL (ref 78.0–100.0)
MCV: 91.6 fL (ref 78.0–100.0)
Platelets: 135 10*3/uL — ABNORMAL LOW (ref 150–400)
Platelets: 174 10*3/uL (ref 150–400)
Platelets: 182 10*3/uL (ref 150–400)
Platelets: 194 10*3/uL (ref 150–400)
Platelets: 196 10*3/uL (ref 150–400)
Platelets: 222 10*3/uL (ref 150–400)
Platelets: 228 10*3/uL (ref 150–400)
Platelets: 240 10*3/uL (ref 150–400)
Platelets: 293 10*3/uL (ref 150–400)
Platelets: 340 10*3/uL (ref 150–400)
RBC: 2 MIL/uL — ABNORMAL LOW (ref 4.22–5.81)
RBC: 2.35 MIL/uL — ABNORMAL LOW (ref 4.22–5.81)
RBC: 2.46 MIL/uL — ABNORMAL LOW (ref 4.22–5.81)
RBC: 2.57 MIL/uL — ABNORMAL LOW (ref 4.22–5.81)
RBC: 2.61 MIL/uL — ABNORMAL LOW (ref 4.22–5.81)
RBC: 2.68 MIL/uL — ABNORMAL LOW (ref 4.22–5.81)
RBC: 2.7 MIL/uL — ABNORMAL LOW (ref 4.22–5.81)
RBC: 3.24 MIL/uL — ABNORMAL LOW (ref 4.22–5.81)
RBC: 3.28 MIL/uL — ABNORMAL LOW (ref 4.22–5.81)
RBC: 4.78 MIL/uL (ref 4.22–5.81)
RDW: 13.4 % (ref 11.5–15.5)
RDW: 14 % (ref 11.5–15.5)
RDW: 14.1 % (ref 11.5–15.5)
RDW: 14.1 % (ref 11.5–15.5)
RDW: 14.2 % (ref 11.5–15.5)
RDW: 14.4 % (ref 11.5–15.5)
RDW: 14.7 % (ref 11.5–15.5)
RDW: 15.4 % (ref 11.5–15.5)
RDW: 15.4 % (ref 11.5–15.5)
RDW: 13.2 % (ref 11.5–15.5)
WBC: 10.1 10*3/uL (ref 4.0–10.5)
WBC: 10.1 10*3/uL (ref 4.0–10.5)
WBC: 14.6 10*3/uL — ABNORMAL HIGH (ref 4.0–10.5)
WBC: 15.9 10*3/uL — ABNORMAL HIGH (ref 4.0–10.5)
WBC: 6.2 10*3/uL (ref 4.0–10.5)
WBC: 7.6 10*3/uL (ref 4.0–10.5)
WBC: 8.1 10*3/uL (ref 4.0–10.5)
WBC: 8.5 10*3/uL (ref 4.0–10.5)
WBC: 9.1 10*3/uL (ref 4.0–10.5)
WBC: 10.2 10*3/uL (ref 4.0–10.5)

## 2011-01-29 LAB — BASIC METABOLIC PANEL
BUN: 11 mg/dL (ref 6–23)
BUN: 18 mg/dL (ref 6–23)
BUN: 36 mg/dL — ABNORMAL HIGH (ref 6–23)
BUN: 32 mg/dL — ABNORMAL HIGH (ref 6–23)
CO2: 21 mEq/L (ref 19–32)
CO2: 22 mEq/L (ref 19–32)
CO2: 26 mEq/L (ref 19–32)
CO2: 25 mEq/L (ref 19–32)
Calcium: 7.9 mg/dL — ABNORMAL LOW (ref 8.4–10.5)
Calcium: 8.2 mg/dL — ABNORMAL LOW (ref 8.4–10.5)
Calcium: 8.2 mg/dL — ABNORMAL LOW (ref 8.4–10.5)
Calcium: 9.1 mg/dL (ref 8.4–10.5)
Chloride: 113 mEq/L — ABNORMAL HIGH (ref 96–112)
Chloride: 115 mEq/L — ABNORMAL HIGH (ref 96–112)
Chloride: 116 mEq/L — ABNORMAL HIGH (ref 96–112)
Chloride: 111 mEq/L (ref 96–112)
Creatinine, Ser: 0.86 mg/dL (ref 0.4–1.5)
Creatinine, Ser: 0.95 mg/dL (ref 0.4–1.5)
Creatinine, Ser: 0.99 mg/dL (ref 0.4–1.5)
Creatinine, Ser: 1.06 mg/dL (ref 0.4–1.5)
GFR calc Af Amer: >60 mL/min (ref >60)
GFR calc Af Amer: >60 mL/min (ref >60)
GFR calc Af Amer: >60 mL/min (ref >60)
GFR calc Af Amer: >60 mL/min (ref >60)
GFR calc non Af Amer: >60 mL/min (ref >60)
GFR calc non Af Amer: >60 mL/min (ref >60)
GFR calc non Af Amer: >60 mL/min (ref >60)
GFR calc non Af Amer: >60 mL/min (ref >60)
Glucose, Bld: 104 mg/dL — ABNORMAL HIGH (ref 70–99)
Glucose, Bld: 105 mg/dL — ABNORMAL HIGH (ref 70–99)
Glucose, Bld: 110 mg/dL — ABNORMAL HIGH (ref 70–99)
Glucose, Bld: 101 mg/dL — ABNORMAL HIGH (ref 70–99)
Potassium: 3.3 mEq/L — ABNORMAL LOW (ref 3.5–5.1)
Potassium: 3.6 mEq/L (ref 3.5–5.1)
Potassium: 4.4 mEq/L (ref 3.5–5.1)
Potassium: 3.9 mEq/L (ref 3.5–5.1)
Sodium: 141 mEq/L (ref 135–145)
Sodium: 142 mEq/L (ref 135–145)
Sodium: 143 mEq/L (ref 135–145)
Sodium: 142 mEq/L (ref 135–145)

## 2011-01-29 LAB — CROSSMATCH
ABO/RH(D): O POS
Antibody Screen: NEGATIVE

## 2011-01-29 LAB — COMPREHENSIVE METABOLIC PANEL
ALT: 12 U/L (ref 0–53)
ALT: 14 U/L (ref 0–53)
ALT: 15 U/L (ref 0–53)
AST: 13 U/L (ref 0–37)
AST: 15 U/L (ref 0–37)
AST: 21 U/L (ref 0–37)
Albumin: 2.4 g/dL — ABNORMAL LOW (ref 3.5–5.2)
Albumin: 2.8 g/dL — ABNORMAL LOW (ref 3.5–5.2)
Albumin: 4.3 g/dL (ref 3.5–5.2)
Alkaline Phosphatase: 45 U/L (ref 39–117)
Alkaline Phosphatase: 53 U/L (ref 39–117)
Alkaline Phosphatase: 67 U/L (ref 39–117)
BUN: 45 mg/dL — ABNORMAL HIGH (ref 6–23)
BUN: 5 mg/dL — ABNORMAL LOW (ref 6–23)
BUN: 67 mg/dL — ABNORMAL HIGH (ref 6–23)
CO2: 25 mEq/L (ref 19–32)
CO2: 27 mEq/L (ref 19–32)
CO2: 23 mEq/L (ref 19–32)
Calcium: 8.5 mg/dL (ref 8.4–10.5)
Calcium: 8.8 mg/dL (ref 8.4–10.5)
Calcium: 10.3 mg/dL (ref 8.4–10.5)
Chloride: 113 mEq/L — ABNORMAL HIGH (ref 96–112)
Chloride: 115 mEq/L — ABNORMAL HIGH (ref 96–112)
Chloride: 100 mEq/L (ref 96–112)
Creatinine, Ser: 0.97 mg/dL (ref 0.4–1.5)
Creatinine, Ser: 1.09 mg/dL (ref 0.4–1.5)
Creatinine, Ser: 2.16 mg/dL — ABNORMAL HIGH (ref 0.4–1.5)
GFR calc Af Amer: >60 mL/min (ref >60)
GFR calc Af Amer: >60 mL/min (ref >60)
GFR calc Af Amer: 37 mL/min — ABNORMAL LOW (ref >60)
GFR calc non Af Amer: >60 mL/min (ref >60)
GFR calc non Af Amer: >60 mL/min (ref >60)
GFR calc non Af Amer: 31 mL/min — ABNORMAL LOW (ref >60)
Glucose, Bld: 101 mg/dL — ABNORMAL HIGH (ref 70–99)
Glucose, Bld: 126 mg/dL — ABNORMAL HIGH (ref 70–99)
Glucose, Bld: 123 mg/dL — ABNORMAL HIGH (ref 70–99)
Potassium: 3.7 mEq/L (ref 3.5–5.1)
Potassium: 3.9 mEq/L (ref 3.5–5.1)
Potassium: 4.8 mEq/L (ref 3.5–5.1)
Sodium: 144 mEq/L (ref 135–145)
Sodium: 145 mEq/L (ref 135–145)
Sodium: 137 mEq/L (ref 135–145)
Total Bilirubin: 0.4 mg/dL (ref 0.3–1.2)
Total Bilirubin: 0.7 mg/dL (ref 0.3–1.2)
Total Bilirubin: 0.9 mg/dL (ref 0.3–1.2)
Total Protein: 4 g/dL — ABNORMAL LOW (ref 6.0–8.3)
Total Protein: 4.6 g/dL — ABNORMAL LOW (ref 6.0–8.3)
Total Protein: 7.4 g/dL (ref 6.0–8.3)

## 2011-01-29 LAB — CARDIAC PANEL(CRET KIN+CKTOT+MB+TROPI)
CK, MB: 0.8 ng/mL (ref 0.3–4.0)
CK, MB: 0.9 ng/mL (ref 0.3–4.0)
CK, MB: 1.8 ng/mL (ref 0.3–4.0)
CK, MB: 2.3 ng/mL (ref 0.3–4.0)
Relative Index: INVALID (ref 0.0–2.5)
Relative Index: INVALID (ref 0.0–2.5)
Relative Index: 1.3 (ref 0.0–2.5)
Relative Index: 1.4 (ref 0.0–2.5)
Total CK: 26 U/L (ref 7–232)
Total CK: 49 U/L (ref 7–232)
Total CK: 138 U/L (ref 7–232)
Total CK: 167 U/L (ref 7–232)
Troponin I: 0.01 ng/mL (ref 0.00–0.06)
Troponin I: 0.01 ng/mL (ref 0.00–0.06)
Troponin I: 0.01 ng/mL (ref 0.00–0.06)
Troponin I: 0.01 ng/mL (ref 0.00–0.06)

## 2011-01-29 LAB — CK TOTAL AND CKMB (NOT AT ARMC)
CK, MB: 0.8 ng/mL (ref 0.3–4.0)
CK, MB: 2.9 ng/mL (ref 0.3–4.0)
Relative Index: INVALID (ref 0.0–2.5)
Relative Index: 1.4 (ref 0.0–2.5)
Total CK: 25 U/L (ref 7–232)
Total CK: 203 U/L (ref 7–232)

## 2011-01-29 LAB — URINALYSIS, ROUTINE W REFLEX MICROSCOPIC
Bilirubin Urine: NEGATIVE
Bilirubin Urine: NEGATIVE
Glucose, UA: NEGATIVE mg/dL
Glucose, UA: NEGATIVE mg/dL
Hgb urine dipstick: NEGATIVE
Ketones, ur: NEGATIVE mg/dL
Ketones, ur: NEGATIVE mg/dL
Leukocytes, UA: NEGATIVE
Nitrite: NEGATIVE
Nitrite: NEGATIVE
Protein, ur: NEGATIVE mg/dL
Protein, ur: NEGATIVE mg/dL
Specific Gravity, Urine: 1.017 (ref 1.005–1.030)
Specific Gravity, Urine: 1.008 (ref 1.005–1.030)
Urobilinogen, UA: 0.2 mg/dL (ref 0.0–1.0)
Urobilinogen, UA: 0.2 mg/dL (ref 0.0–1.0)
pH: 5.5 (ref 5.0–8.0)
pH: 6.5 (ref 5.0–8.0)

## 2011-01-29 LAB — MRSA PCR SCREENING: MRSA by PCR: NEGATIVE

## 2011-01-29 LAB — DIFFERENTIAL
Basophils Absolute: 0 10*3/uL (ref 0.0–0.1)
Basophils Absolute: 0.1 10*3/uL (ref 0.0–0.1)
Basophils Relative: 0 % (ref 0–1)
Basophils Relative: 1 % (ref 0–1)
Eosinophils Absolute: 0 10*3/uL (ref 0.0–0.7)
Eosinophils Absolute: 0 10*3/uL (ref 0.0–0.7)
Eosinophils Relative: 0 % (ref 0–5)
Eosinophils Relative: 0 % (ref 0–5)
Lymphocytes Relative: 9 % — ABNORMAL LOW (ref 12–46)
Lymphocytes Relative: 13 % (ref 12–46)
Lymphs Abs: 0.9 10*3/uL (ref 0.7–4.0)
Lymphs Abs: 1.3 10*3/uL (ref 0.7–4.0)
Monocytes Absolute: 0.4 10*3/uL (ref 0.1–1.0)
Monocytes Absolute: 0.6 10*3/uL (ref 0.1–1.0)
Monocytes Relative: 4 % (ref 3–12)
Monocytes Relative: 6 % (ref 3–12)
Neutro Abs: 8.7 10*3/uL — ABNORMAL HIGH (ref 1.7–7.7)
Neutro Abs: 8.2 10*3/uL — ABNORMAL HIGH (ref 1.7–7.7)
Neutrophils Relative %: 87 % — ABNORMAL HIGH (ref 43–77)
Neutrophils Relative %: 81 % — ABNORMAL HIGH (ref 43–77)

## 2011-01-29 LAB — PROTIME-INR
INR: 1.09 (ref 0.00–1.49)
INR: 1.24 (ref 0.00–1.49)
Prothrombin Time: 14 seconds (ref 11.6–15.2)
Prothrombin Time: 15.5 seconds — ABNORMAL HIGH (ref 11.6–15.2)

## 2011-01-29 LAB — HEMOGLOBIN AND HEMATOCRIT, BLOOD
HCT: 26.3 % — ABNORMAL LOW (ref 39.0–52.0)
Hemoglobin: 9.1 g/dL — ABNORMAL LOW (ref 13.0–17.0)

## 2011-01-29 LAB — HEMOCCULT GUIAC POC 1CARD (OFFICE): Fecal Occult Bld: POSITIVE

## 2011-01-29 LAB — APTT: aPTT: 27 seconds (ref 24–37)

## 2011-01-29 LAB — GASTRIN: Gastrin: 364

## 2011-01-29 LAB — GLUCOSE, CAPILLARY: Glucose-Capillary: 152 mg/dL — ABNORMAL HIGH (ref 70–99)

## 2011-01-29 LAB — LIPID PANEL
Cholesterol: 211 mg/dL — ABNORMAL HIGH (ref 0–200)
HDL: 32 mg/dL — ABNORMAL LOW (ref >39)
LDL Cholesterol: 133 mg/dL — ABNORMAL HIGH (ref 0–99)
Total CHOL/HDL Ratio: 6.6 RATIO
Triglycerides: 232 mg/dL — ABNORMAL HIGH (ref <150)
VLDL: 46 mg/dL — ABNORMAL HIGH (ref 0–40)

## 2011-01-29 LAB — URINE MICROSCOPIC-ADD ON

## 2011-01-29 LAB — PREPARE RBC (CROSSMATCH)

## 2011-01-29 LAB — CREATININE, URINE, RANDOM: Creatinine, Urine: 57 mg/dL

## 2011-01-29 LAB — TSH: TSH: 1.068 u[IU]/mL (ref 0.350–4.500)

## 2011-01-29 LAB — BUN: BUN: 22 mg/dL (ref 6–23)

## 2011-01-29 LAB — TROPONIN I
Troponin I: 0.01 ng/mL (ref 0.00–0.06)
Troponin I: 0.02 ng/mL (ref 0.00–0.06)

## 2011-01-29 LAB — SODIUM, URINE, RANDOM: Sodium, Ur: 63 mEq/L

## 2011-01-30 NOTE — Progress Notes (Signed)
Summary: REFILL   Phone Note Refill Request   Refills Requested: Medication #1:  OXYCODONE HCL 15 MG TABS 1 by mouth qid as needed pain. Please Initial call taken by: Lamar Sprinkles, CMA,  January 22, 2011 1:00 PM  Follow-up for Phone Call        ok to ref Follow-up by: Tresa Garter MD,  January 22, 2011 6:13 PM  Additional Follow-up for Phone Call Additional follow up Details #1::        Pt informed, rx ready Additional Follow-up by: Lamar Sprinkles, CMA,  January 23, 2011 9:46 AM    New/Updated Medications: OXYCODONE HCL 15 MG TABS (OXYCODONE HCL) 1 by mouth qid as needed pain. Please,  fill on or after   01/25/11         . OK to fill 1-2 day earlier when the month has 31 days in it. Prescriptions: OXYCODONE HCL 15 MG TABS (OXYCODONE HCL) 1 by mouth qid as needed pain. Please,  fill on or after   01/25/11         . OK to fill 1-2 day earlier when the month has 31 days in it.  #120 x 0   Entered by:   Lamar Sprinkles, CMA   Authorized by:   Tresa Garter MD   Signed by:   Lamar Sprinkles, CMA on 01/22/2011   Method used:   Print then Give to Patient   RxID:   [id number]

## 2011-02-01 ENCOUNTER — Ambulatory Visit: Payer: Medicare Other

## 2011-02-01 DIAGNOSIS — Z0389 Encounter for observation for other suspected diseases and conditions ruled out: Secondary | ICD-10-CM

## 2011-02-01 DIAGNOSIS — E785 Hyperlipidemia, unspecified: Secondary | ICD-10-CM

## 2011-02-01 DIAGNOSIS — Z0000 Encounter for general adult medical examination without abnormal findings: Secondary | ICD-10-CM

## 2011-02-01 LAB — BASIC METABOLIC PANEL
BUN: 16 mg/dL (ref 6–23)
CO2: 30 mEq/L (ref 19–32)
Calcium: 9.8 mg/dL (ref 8.4–10.5)
Chloride: 101 mEq/L (ref 96–112)
Creatinine, Ser: 1.2 mg/dL (ref 0.4–1.5)
GFR: 63.89 mL/min (ref >60.00)
Glucose, Bld: 97 mg/dL (ref 70–99)
Potassium: 5.7 mEq/L — ABNORMAL HIGH (ref 3.5–5.1)
Sodium: 138 mEq/L (ref 135–145)

## 2011-02-01 LAB — URINALYSIS
Bilirubin Urine: NEGATIVE
Hgb urine dipstick: NEGATIVE
Ketones, ur: NEGATIVE
Leukocytes, UA: NEGATIVE
Nitrite: NEGATIVE
Specific Gravity, Urine: 1.01 (ref 1.000–1.030)
Total Protein, Urine: NEGATIVE
Urine Glucose: NEGATIVE
Urobilinogen, UA: 0.2 (ref 0.0–1.0)
pH: 7 (ref 5.0–8.0)

## 2011-02-01 LAB — PSA: PSA: 0.14 ng/mL (ref 0.10–4.00)

## 2011-02-01 LAB — LIPID PANEL
Cholesterol: 224 mg/dL — ABNORMAL HIGH (ref 0–200)
HDL: 51.5 mg/dL (ref >39.00)
Total CHOL/HDL Ratio: 4
Triglycerides: 168 mg/dL — ABNORMAL HIGH (ref 0.0–149.0)
VLDL: 33.6 mg/dL (ref 0.0–40.0)

## 2011-02-01 LAB — CBC WITH DIFFERENTIAL/PLATELET
Basophils Absolute: 0 10*3/uL (ref 0.0–0.1)
Basophils Relative: 0.4 % (ref 0.0–3.0)
Eosinophils Absolute: 0 10*3/uL (ref 0.0–0.7)
Eosinophils Relative: 1.2 % (ref 0.0–5.0)
HCT: 38.9 % — ABNORMAL LOW (ref 39.0–52.0)
Hemoglobin: 13.4 g/dL (ref 13.0–17.0)
Lymphocytes Relative: 24.3 % (ref 12.0–46.0)
Lymphs Abs: 0.9 10*3/uL (ref 0.7–4.0)
MCHC: 34.4 g/dL (ref 30.0–36.0)
MCV: 89.7 fl (ref 78.0–100.0)
Monocytes Absolute: 0.4 10*3/uL (ref 0.1–1.0)
Monocytes Relative: 9.9 % (ref 3.0–12.0)
Neutro Abs: 2.4 10*3/uL (ref 1.4–7.7)
Neutrophils Relative %: 64.2 % (ref 43.0–77.0)
Platelets: 264 10*3/uL (ref 150.0–400.0)
RBC: 4.34 Mil/uL (ref 4.22–5.81)
RDW: 14.2 % (ref 11.5–14.6)
WBC: 3.7 10*3/uL — ABNORMAL LOW (ref 4.5–10.5)

## 2011-02-01 LAB — HEPATIC FUNCTION PANEL
ALT: 28 U/L (ref 0–53)
AST: 27 U/L (ref 0–37)
Albumin: 3.9 g/dL (ref 3.5–5.2)
Alkaline Phosphatase: 93 U/L (ref 39–117)
Bilirubin, Direct: 0.1 mg/dL (ref 0.0–0.3)
Total Bilirubin: 0.5 mg/dL (ref 0.3–1.2)
Total Protein: 6.7 g/dL (ref 6.0–8.3)

## 2011-02-01 LAB — LDL CHOLESTEROL, DIRECT: Direct LDL: 150.9 mg/dL

## 2011-02-01 LAB — TSH: TSH: 0.59 u[IU]/mL (ref 0.35–5.50)

## 2011-02-09 ENCOUNTER — Ambulatory Visit (INDEPENDENT_AMBULATORY_CARE_PROVIDER_SITE_OTHER): Payer: BC Managed Care – PPO | Admitting: Internal Medicine

## 2011-02-09 ENCOUNTER — Encounter: Payer: Self-pay | Admitting: Internal Medicine

## 2011-02-09 DIAGNOSIS — R5381 Other malaise: Secondary | ICD-10-CM

## 2011-02-09 DIAGNOSIS — F411 Generalized anxiety disorder: Secondary | ICD-10-CM

## 2011-02-09 DIAGNOSIS — Z0000 Encounter for general adult medical examination without abnormal findings: Secondary | ICD-10-CM

## 2011-02-09 DIAGNOSIS — H6120 Impacted cerumen, unspecified ear: Secondary | ICD-10-CM

## 2011-02-09 DIAGNOSIS — L821 Other seborrheic keratosis: Secondary | ICD-10-CM

## 2011-02-09 DIAGNOSIS — Z23 Encounter for immunization: Secondary | ICD-10-CM

## 2011-02-09 DIAGNOSIS — E785 Hyperlipidemia, unspecified: Secondary | ICD-10-CM

## 2011-02-09 DIAGNOSIS — I10 Essential (primary) hypertension: Secondary | ICD-10-CM

## 2011-02-09 DIAGNOSIS — D509 Iron deficiency anemia, unspecified: Secondary | ICD-10-CM

## 2011-02-09 DIAGNOSIS — L570 Actinic keratosis: Secondary | ICD-10-CM

## 2011-02-09 DIAGNOSIS — M545 Low back pain, unspecified: Secondary | ICD-10-CM

## 2011-02-09 MED ORDER — FLUTICASONE PROPIONATE 50 MCG/ACT NA SUSP: 2.0000 | Freq: Every day | NASAL | Status: DC

## 2011-02-09 MED ORDER — OXYCODONE HCL 15 MG PO TABS: 15.0000 mg | ORAL_TABLET | Freq: Four times a day (QID) | ORAL | Status: DC | PRN

## 2011-02-09 MED ORDER — ATORVASTATIN CALCIUM 10 MG PO TABS: 10.0000 mg | ORAL_TABLET | Freq: Every day | ORAL | Status: DC

## 2011-02-09 MED ORDER — TETANUS-DIPHTHERIA TOXOIDS TD 2-2 LF/0.5ML IM SUSP
0.5000 mL | Freq: Once | INTRAMUSCULAR | Status: AC
  Administered 2011-02-09: 0.5 mL via INTRAMUSCULAR

## 2011-02-09 MED ORDER — LOSARTAN POTASSIUM 25 MG PO TABS: 25.0000 mg | ORAL_TABLET | Freq: Every day | ORAL | Status: DC

## 2011-02-09 NOTE — Progress Notes (Signed)
Subjective:    Patient ID: Cody Frazier, male    DOB: Feb 17, 1946, 65 y.o.   MRN: [id number]  HPI The patient is here for annual Medicare wellness examination and management of other chronic and acute problems.  The risk factors are reflected in the social history. The roster all physicians providing medical care to patient - please, see in the Snapshot section of the chart.  Activities of daily living:  The patient is able 2 prepare food and peds date getting dressed using the toilet morning around from place to place without assistance.     Home safety :    The patient has smoke detectors and wears seatbelts. No firearms at home. No excess sun exposure.      Diet and exercise:  The patient  does exercise.  The need to exercise regularly was discussed. Dietary issues discussed. Healthy diet discussed.      Depression screen :  Over the past 2 weeks, the patient has not felt down, depressed or hopeless. Over the past 2 weeks, the patient hasn't felt little interest or pleasure in doing things.      Past surgical history and problem list:  The following portions of the patient's history were reviewed and updated as appropriate:  allergies, current medications, past family history, past medical history,  past surgical history, past social history  and problem list.  Vision, hearing, body mass index were assessed and reviewed. Cognitive impairment assessment revealed cognition, memory and judgment appear normal.   During the course of the visit the patient was educated and counseled about appropriate screening and preventive services including : fall prevention , diabetes screening, nutrition counseling, vaccination.  C/o occ. BP elevation (he has been off meds x 1 year). He took 1/2 of his old BP med - BP dropped too low x 1 d    Review of Systems  Constitutional: Negative for chills, activity change, appetite change, fatigue and unexpected weight change.  HENT: Negative  for nosebleeds, congestion, sore throat, sneezing, trouble swallowing, neck pain and ear discharge.   Eyes: Negative for redness, itching and visual disturbance.  Respiratory: Negative for apnea, cough, shortness of breath, wheezing and stridor.   Cardiovascular: Negative for chest pain, palpitations and leg swelling.  Gastrointestinal: Negative for nausea, diarrhea, blood in stool and abdominal distention.  Genitourinary: Negative for dysuria, frequency, hematuria and scrotal swelling.  Musculoskeletal: Negative for back pain, joint swelling and gait problem.  Skin: Negative for rash.  Neurological: Negative for dizziness, tremors, speech difficulty, weakness and headaches.  Psychiatric/Behavioral: Negative for sleep disturbance, dysphoric mood, decreased concentration and agitation. The patient is not nervous/anxious.        Objective:   Physical Exam  Constitutional: He is oriented to person, place, and time. He appears well-developed and well-nourished. No distress.  HENT:  Head: Normocephalic and atraumatic.  Right Ear: External ear normal.  Left Ear: External ear normal.  Nose: Nose normal.  Mouth/Throat: Oropharynx is clear and moist. No oropharyngeal exudate.  Eyes: Conjunctivae and EOM are normal. Pupils are equal, round, and reactive to light. Right eye exhibits no discharge. Left eye exhibits no discharge. No scleral icterus.  Neck: Normal range of motion. Neck supple. No JVD present. No tracheal deviation present. No thyromegaly present.  Cardiovascular: Normal rate, regular rhythm, normal heart sounds and intact distal pulses.  Exam reveals no gallop and no friction rub.   No murmur heard. Pulmonary/Chest: Effort normal and breath sounds normal. No stridor. No respiratory distress. He  has no wheezes. He has no rales. He exhibits no tenderness.  Abdominal: Soft. Bowel sounds are normal. He exhibits no distension and no mass. There is no tenderness. There is no rebound and no  guarding.  Genitourinary:       Pt declined  Musculoskeletal: Normal range of motion. He exhibits no edema and no tenderness.  Lymphadenopathy:    He has no cervical adenopathy.  Neurological: He is alert and oriented to person, place, and time. He has normal reflexes. No cranial nerve deficit. He exhibits normal muscle tone. Coordination normal.  Skin: Skin is warm and dry. No rash noted. He is not diaphoretic. No erythema. No pallor.  Psychiatric: He has a normal mood and affect. His behavior is normal. Judgment and thought content normal.          Assessment & Plan:  Well adult exam No need for colonoscopy. Labs discussed. Will give dT Wt Readings from Last 3 Encounters:  02/09/11 171 lb (77.565 kg)  12/21/10 173 lb (78.472 kg)  10/20/10 171 lb (77.565 kg)   Lab Results  Component Value Date   WBC 3.7* 02/01/2011   HGB 13.4 02/01/2011   HCT 38.9* 02/01/2011   PLT 264.0 02/01/2011   CHOL 224* 02/01/2011   TRIG 168.0* 02/01/2011   HDL 51.50 02/01/2011   LDLDIRECT 150.9 02/01/2011   ALT 28 02/01/2011   AST 27 02/01/2011   NA 138 02/01/2011   K 5.7* 02/01/2011   CL 101 02/01/2011   CREATININE 1.2 02/01/2011   BUN 16 02/01/2011   CO2 30 02/01/2011   TSH 0.59 02/01/2011   PSA 0.14 02/01/2011   INR 1.24 04/17/2010   HGBA1C 5.5 04/30/2008      DYSLIPIDEMIA Will try Lipitor - low dose  HYPERTENSION Will start a low dose of Cozaar if tolerated   Hyperkalemia  AKs and SKs on R temple  Cerumen impaction B I removed wax with an ear loop

## 2011-02-09 NOTE — Assessment & Plan Note (Signed)
Will start a low dose of Cozaar if tolerated

## 2011-02-09 NOTE — Assessment & Plan Note (Signed)
No need for colonoscopy. Labs discussed. Will give dT Wt Readings from Last 3 Encounters:  02/09/11 171 lb (77.565 kg)  12/21/10 173 lb (78.472 kg)  10/20/10 171 lb (77.565 kg)   Lab Results  Component Value Date   WBC 3.7* 02/01/2011   HGB 13.4 02/01/2011   HCT 38.9* 02/01/2011   PLT 264.0 02/01/2011   CHOL 224* 02/01/2011   TRIG 168.0* 02/01/2011   HDL 51.50 02/01/2011   LDLDIRECT 150.9 02/01/2011   ALT 28 02/01/2011   AST 27 02/01/2011   NA 138 02/01/2011   K 5.7* 02/01/2011   CL 101 02/01/2011   CREATININE 1.2 02/01/2011   BUN 16 02/01/2011   CO2 30 02/01/2011   TSH 0.59 02/01/2011   PSA 0.14 02/01/2011   INR 1.24 04/17/2010   HGBA1C 5.5 04/30/2008

## 2011-02-09 NOTE — Assessment & Plan Note (Addendum)
Will try Lipitor - low dose

## 2011-02-17 LAB — DIFFERENTIAL
Basophils Absolute: 0 10*3/uL (ref 0.0–0.1)
Basophils Relative: 0 % (ref 0–1)
Eosinophils Absolute: 0 10*3/uL (ref 0.0–0.7)
Eosinophils Relative: 0 % (ref 0–5)
Lymphocytes Relative: 6 % — ABNORMAL LOW (ref 12–46)
Lymphs Abs: 0.6 10*3/uL — ABNORMAL LOW (ref 0.7–4.0)
Monocytes Absolute: 0.2 10*3/uL (ref 0.1–1.0)
Monocytes Relative: 2 % — ABNORMAL LOW (ref 3–12)
Neutro Abs: 9.5 10*3/uL — ABNORMAL HIGH (ref 1.7–7.7)
Neutrophils Relative %: 92 % — ABNORMAL HIGH (ref 43–77)

## 2011-02-17 LAB — URINALYSIS, ROUTINE W REFLEX MICROSCOPIC
Bilirubin Urine: NEGATIVE
Glucose, UA: NEGATIVE mg/dL
Ketones, ur: >80 mg/dL — AB
Leukocytes, UA: NEGATIVE
Nitrite: NEGATIVE
Protein, ur: 30 mg/dL — AB
Specific Gravity, Urine: 1.018 (ref 1.005–1.030)
Urobilinogen, UA: 0.2 mg/dL (ref 0.0–1.0)
pH: 7.5 (ref 5.0–8.0)

## 2011-02-17 LAB — CBC
HCT: 48.7 % (ref 39.0–52.0)
Hemoglobin: 16.6 g/dL (ref 13.0–17.0)
MCHC: 34.2 g/dL (ref 30.0–36.0)
MCV: 91.1 fL (ref 78.0–100.0)
Platelets: 281 10*3/uL (ref 150–400)
RBC: 5.34 MIL/uL (ref 4.22–5.81)
RDW: 12.9 % (ref 11.5–15.5)
WBC: 10.4 10*3/uL (ref 4.0–10.5)

## 2011-02-17 LAB — COMPREHENSIVE METABOLIC PANEL
ALT: 16 U/L (ref 0–53)
AST: 23 U/L (ref 0–37)
Albumin: 4.4 g/dL (ref 3.5–5.2)
Alkaline Phosphatase: 94 U/L (ref 39–117)
BUN: 9 mg/dL (ref 6–23)
CO2: 22 mEq/L (ref 19–32)
Calcium: 10.2 mg/dL (ref 8.4–10.5)
Chloride: 103 mEq/L (ref 96–112)
Creatinine, Ser: 1.1 mg/dL (ref 0.4–1.5)
GFR calc Af Amer: >60 mL/min (ref >60)
GFR calc non Af Amer: >60 mL/min (ref >60)
Glucose, Bld: 139 mg/dL — ABNORMAL HIGH (ref 70–99)
Potassium: 3.4 mEq/L — ABNORMAL LOW (ref 3.5–5.1)
Sodium: 139 mEq/L (ref 135–145)
Total Bilirubin: 0.6 mg/dL (ref 0.3–1.2)
Total Protein: 7.5 g/dL (ref 6.0–8.3)

## 2011-02-17 LAB — URINE MICROSCOPIC-ADD ON

## 2011-02-17 LAB — LACTIC ACID, PLASMA: Lactic Acid, Venous: 1.2 mmol/L (ref 0.5–2.2)

## 2011-02-17 LAB — LIPASE, BLOOD: Lipase: 20 U/L (ref 11–59)

## 2011-02-27 NOTE — Progress Notes (Signed)
  Subjective:    Patient ID: Cody Frazier, male    DOB: 08/28/1946, 65 y.o.   MRN: [id number]  HPI    Review of Systems     Objective:   Physical Exam        Assessment & Plan:    Addendum:   Procedure Note :     Procedure : Cryosurgery   Indication:   Actinic keratosis(es)   Risks including unsuccessful procedure , bleeding, infection, bruising, scar, a need for a repeat  procedure and others were explained to the patient in detail as well as the benefits. Informed consent was obtained verbally.    2 lesion(s)  on    Temple were treated with liquid nitrogen on a Q-tip in a usual fasion . Band-Aid was applied and antibiotic ointment was given for a later use.   Tolerated well. Complications none.   Postprocedure instructions :     Keep the wounds clean. You can wash them with liquid soap and water. Pat dry with gauze or a Kleenex tissue  Before applying antibiotic ointment and a Band-Aid.   You need to report immediately  if  any signs of infection develop.

## 2011-03-27 NOTE — Assessment & Plan Note (Signed)
[facility]                            CARDIOLOGY OFFICE NOTE   Cody Frazier, Cody Frazier                      MRN:          [id number]  DATE:06/16/2008                            DOB:          Jun 30, 1946    PRIMARY CARE PHYSICIAN:  Georgina Quint. Plotnikov, MD.   REASON FOR PRESENTATION:  Evaluate patient with atrial fibrillation at  the time of back surgery.   HISTORY OF PRESENT ILLNESS:  I met Cody Frazier who was a 65 year old  white gentleman in the hospital recently when he had sinus tachycardia,  atrial tachycardia and atrial fibrillation status post back surgery.  He  had some bleeding issues centering around the surgery.  Because of this  and because he was also a low risk for thromboembolic events, he did not  get anticoagulated.  He actually converted spontaneously to sinus  rhythm.  He did have an echocardiogram as per the hospitalization  demonstrate his EF to be 60% with no wall motion abnormalities and no  valvular abnormalities.  His hospitalization was complicated by bleeding  requiring several units of transfusion.  There was a question of a  bleeding diathesis and he was evaluated by the hematologist.   Since going home.  He notices that his heart rate is elevated, but he  said he always has a high resting heart rate.  He has had no paroxysmal  tachy palpitations.  He has had no presyncope or syncope.  He has had no  chest discomfort, neck, or arm discomfort.  He has had no shortness of  breath.   PAST MEDICAL HISTORY:  1. Hypertension.  2. Degenerative disk disease.  3. Sigmoid diverticulitis.  4. Hyperlipidemia.  5. Atrial arrhythmia as described.  6. L1-L5 surgery.  7. L2-L5 and S1 laminectomy,  fasciectomy, and diskectomy, scoliosis,      radiculopathy.  8. Peptic ulcer disease.  9. Colonic polyps.  10.Nephrolithiasis.  11.Renal cyst.   ALLERGIES:  Intolerances none.   MEDICATIONS:  1. Fluticasone nasal spray.  2.  Pancrease.  3. Simvastatin 40 mg daily.  4. Allegra.  5. Prevacid.   REVIEW OF SYSTEMS:  As stated in the HPI and otherwise negative for  other systems.   PHYSICAL EXAMINATION:  GENERAL:  The patient is in no distress.  VITAL SIGNS:  Blood pressure 120/80, heart rate 108, and regular.  HEENT:  Eyelids unremarkable, pupils equal, round, reactive to light,  fundi not visualized, oral mucosa unremarkable.  NECK:  No jugular venous distention at 45 degrees, carotid upstroke  brisk and symmetric, no bruits, no thyromegaly.  LYMPHATICS:  No adenopathy.  LUNGS:  Clear to auscultation bilaterally.  HEART:  PMI not displaced or sustained, S1 and S2 within normal.  No S3,  no S4, no clicks, no rubs, no murmurs.  ABDOMEN:  Flat, positive bowel sounds, normal in frequency and pitch, no  bruits, no rebound, no guarding, no midline pulsatile mass, no  organomegaly.  SKIN:  No rashes, no nodules.  EXTREMITIES:  2+ pulse, no edema.   EKG sinus tachycardia, rate 104, axis within normals, intervals within  normal limits.  No acute ST-wave changes.   ASSESSMENT AND PLAN:  1. Tachyarrhythmias, patient has had no sustained symptomatic      tachyarrhythmias.  At this point, he is not going to be on any      anticoagulation especially given his bleeding problems with      surgery.  He is at low risk for thromboembolic events.  My plan is      to see him in a couple of months probably we will order Holter      monitor to make sure he has no further tachyarrhythmias, as he gets      further out from the surgery and recovers.  2. Followup as above.     Rollene Rotunda, MD, [facility]  Electronically Signed    JH/MedQ  DD: 06/16/2008  DT: 06/17/2008  Job #: [id number]

## 2011-03-27 NOTE — Consult Note (Signed)
[name], [name]               ACCOUNT NO.:  [id number]   MEDICAL RECORD NO.:  [id number]          PATIENT TYPE:  INP   LOCATION:                                 FACILITY:   PHYSICIAN:  Rollene Rotunda, MD, FACCDATE OF BIRTH:  1946-09-12   DATE OF CONSULTATION:  05/13/2008  DATE OF DISCHARGE:                                 CONSULTATION   ADDENDUM   Please amend the previously dictated consult to reflect that.   The patient was seen jointly by both me and Dr. Antoine Poche on May 13, 2008, saw the patient in association.      Cody Frazier, ANP      Rollene Rotunda, MD, [facility]  Electronically Signed    CB/MEDQ  D:  05/17/2008  T:  05/18/2008  Job:  [id number]

## 2011-03-27 NOTE — H&P (Signed)
NAME:  Cody Frazier, Cody Frazier               ACCOUNT NO.:  [id number]   MEDICAL RECORD NO.:  [id number]          PATIENT TYPE:  INP   LOCATION:  NA                           FACILITY:  [facility]   PHYSICIAN:  Hilda Lias, M.D.   DATE OF BIRTH:  1946-05-30   DATE OF ADMISSION:  DATE OF DISCHARGE:                              HISTORY & PHYSICAL   HISTORY OF PRESENT ILLNESS:  Mr. 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  The  patient had been following for many years and lately he is getting  worse.  He had been quite a difficulty walking.  He had to take quad  bikes, also decreased flexibility of the lumbar spine with pain going  down to both legs.  He had extraforaminal diskectomy as well as L5-S1  foraminotomy.  Despite conservative treatment including medication,  epidural injection, he is not any better.  We did a myelogram and  because of findings, he is being admitted for surgery.   PAST MEDICAL HISTORY:  Diskectomy at the level L1 and L5-S1.   ALLERGIES:  He is not allergic to medication.   SOCIAL HISTORY:  Negative.   FAMILY HISTORY:  Unremarkable.   REVIEW OF SYSTEMS:  Positive for difficulty urinating, back pain, leg,  and balance disorders.   PHYSICAL EXAMINATION:  GENERAL:  The patient came to my office and he  was walking with a short step and slightly tilt to the right side.  HEAD, NOSE, AND THROAT:  Normal.  NECK:  Normal.  LUNGS:  Clear.  HEART:  Normal.  EXTREMITIES:  Normal pulses.  NEUROLOGIC:  He has weakness of the quadriceps.  He has decrease of  flexibility, also numbness which involved the L5-S1 nerve root.  We did  a myelogram followed by a CT scans which show severe degenerative disk  disease from L2 down to L5-S1.   IMPRESSION:  Degenerative disk disease L2-2, L5-S1.   RECOMMENDATION:  We talked about what to do.  We talked about doing  foraminotomy, which only will relieve the pain but it will  not  accomplish at the end too much.  Finally, upon talking to him and his  wife, we are  going to proceed with decompressive laminectomy and fusion from L2 down  to L5-S1.  He knows about the risks such as infection, CSF leak,  worsening pain, paralysis, needed for surgery, no improvement  whatsoever, damage to the vessel of the abdomen, and failure of fusion.           ______________________________  Hilda Lias, M.D.     EB/MEDQ  D:  05/07/2008  T:  05/07/2008  Job:  [id number]

## 2011-03-27 NOTE — Assessment & Plan Note (Signed)
[facility]                            CARDIOLOGY OFFICE NOTE   Cody, Frazier                      MRN:          [id number]  DATE:12/27/2008                            DOB:          04-16-1946    PRIMARY CARE PHYSICIAN:  Georgina Quint. Plotnikov, MD   REASON FOR PRESENTATION:  Evaluate the patient with atrial fibrillation.   HISTORY OF PRESENT ILLNESS:  The patient is 65 years old.  He had back  surgery complicated with bleeding with peptic ulcer.  He had a  complicated hospital course, which included tachyarrhythmias, including  atrial fibrillation.  However, since slowly recovering from this  surgery, he has had no new tachypalpitations.  He has had no  palpitations, presyncope, or syncope.  He has had no new shortness of  breath, denies any PND or orthopnea.   PAST MEDICAL HISTORY:  1. Hypertension.  2. Degenerative disk disease.  3. Sigmoid diverticulitis.  4. Hyperlipidemia.  5. Atrial arrhythmias as described.  6. L1-L5 surgery.  7. L2, L5, and S1 laminectomy.  8. Fasciectomy and discectomy.  9. Scoliosis.  10.Radiculopathy.  11.Peptic ulcer disease.  12.Colonic polyps.  13.Nephrolithiasis.  14.Renal cyst.   ALLERGIES/INTOLERANCES:  None.   MEDICATIONS:  1. Multivitamin.  2. Protonix 40 mg daily.  3. Bifera.   REVIEW OF SYSTEMS:  As stated in the HPI and otherwise negative for  other systems.   PHYSICAL EXAMINATION:  GENERAL:  The patient is in no distress.  VITAL SIGNS:  Blood pressure 120/76 and heart rate 77 and regular.  NECK:  No jugular venous distention at 45 degrees, carotid upstroke  brisk and symmetric, no bruits, no thyromegaly.  LYMPHATICS:  No cervical, axillary, or inguinal adenopathy.  LUNGS:  Clear to auscultation bilaterally.  BACK:  Well-healed surgical scar.  HEART:  PMI not displaced or sustained; S1 and S2 within normal limits,  no S3, no S4; no clicks, no rubs, no murmurs.  ABDOMEN:  Obese;  positive bowel sounds, normal in frequency and pitch;  no bruits, no rebound, no guarding, no midline pulsatile mass; no  organomegaly.  EXTREMITIES:  Pulses 2+, no edema.   ASSESSMENT AND PLAN:  1. Tachyarrhythmias.  The patient has had no further      tachypalpitations.  All of this occurred at the time of an acute      illness with his surgery.  I do not think further cardiovascular      testing is warranted.  He will continue with medications as listed.  2. Followup.  I will see him as needed.     Rollene Rotunda, MD, [facility]  Electronically Signed    JH/MedQ  DD: 12/27/2008  DT: 12/28/2008  Job #: [id number]   cc:   Georgina Quint. Plotnikov, MD

## 2011-03-27 NOTE — Discharge Summary (Signed)
Cody Frazier, Cody Frazier               ACCOUNT NO.:  [id number]   MEDICAL RECORD NO.:  [id number]          PATIENT TYPE:  INP   LOCATION:  1415                         FACILITY:  [facility]   PHYSICIAN:  Valerie A. Felicity Coyer, MDDATE OF BIRTH:  Nov 14, 1945   DATE OF ADMISSION:  08/12/2008  DATE OF DISCHARGE:  08/20/2008                               DISCHARGE SUMMARY   DISCHARGE DIAGNOSES:  1. Acute gastrointestinal bleed secondary to large pyloric ulcer,      status post EGD October 2 with cautery and epi injection, resolved.  2. Gastric outlet obstruction secondary to large pyloric ulcer, status      post NG suction with IV Protonix drip. Now tolerating full liquids      without pain, nausea, vomiting. To continue same until follow-up      with gastroenterology, see below.  3. Acute blood loss anemia secondary to problem #1, status post 3      units packed red blood cells transfusion this admission, hemoglobin      stable.  4. History of hypertension.  Continue holding home treatment until      follow-up with primary MD, see below.  5. History of paroxysmal atrial fibrillation, not on anticoagulation      prior to admission and currently not candidate for anticoagulation      due to above. Normal sinus rhythm at the time of discharge.  6. History of chronic back pain, may continue Percocet p.r.n.      Outpatient follow-up with Dr. Jeral Fruit as needed.  7. History of benign prostatic hypertrophy.  Continue Flomax.  8. History of dyslipidemia.  Continue Zocor.  9. History of seasonal allergies.  Continue Flonase and Allegra as      prior to admission.   DISCHARGE MEDICATIONS:  1. Protonix 40 mg p.o. b.i.d. until further instruction by GI.  2. Flomax 0.4 mg once daily.  3. Percocet 10/325 one every 4 hours as needed.  4. Zocor 80 mg q.h.s.  5. Flonase nasal spray daily into nostrils.  6. Allegra 180 mg once daily as needed for allergies.  7. Vitamin D 1000 units once daily.   The patient is  instructed to hold fosinopril/hydrochlorothiazide until  follow-up with primary MD.  The patient is also instructed to avoid all  NSAIDs such as aspirin, ibuprofen, Aleve, Goody Powders, etc., and  reviewed list with GI. Hospital follow-up is to schedule with primary  care physician, Dr. Trinna Post Plotnikov, to be arranged in the next 2-3  weeks, also with GI physician, Dr. Russella Dar, or Amy Esterwood, PA-C, in the  next 7 to 10 days.  Actual time pending at time of this dictation.   DISPOSITION:  The patient is discharged home in medically stable and  improved condition.  He is to continue a full liquid only diet as per GI  instructions until follow-up with GI. He is to return to the emergency  room if he develops nausea, vomiting, increasing pain or melanotic  stools. These instructions have been reviewed with him. Information will  be provided at time of discharge regarding diet, and he has reviewed  and  agrees to these instructions.   HOSPITAL COURSE BY PROBLEM:  1. Acute upper GI bleed due to large pyloric channel ulcer with      gastric outlet obstruction.  The patient is a 65 year old gentleman      who presented to primary care physician's office due to nausea,      vomiting and stomach pain.  Due to concern for gastroenteritis he      was admitted to [facility] and a GI consult was obtained      due to positive hemoccult of stool.  He was also found to be      significantly anemic at the time of admission with hemoglobin of      7.2, further increasing concern for GI bleed given Hemoccult      positive stool. He had no history of NSAID use and was started on      IV proton pump inhibitor and aggressively re hydrated for      associated hypotension and tachycardia.  He was scheduled for EGD      the following day on October 2 and found to have a large      obstructing pyloric channel ulcer with visible vessel which was      cauterized and injected with epi.  Please see full  note by Dr. Wendall Papa regarding this procedure. Because of the associated      obstruction causing his nausea and vomiting he was maintained on an      NG tube with large amounts of bilious output.  After 72 hours for      decompression a follow-up upper GI series which initially showed      continued obstruction on October 5 was delayed as the patient had      decreased biliary output from the NG.  It was clamped and allowed      to clear which he had tolerated well.  Thus NG tube was      discontinued on August 8 and he was advanced to full liquids which      he has tolerated well and is anxious for discharge home.  He has      had no recurrent nausea, vomiting or abdominal pain since advancing      his diet, and his hemoglobin has remained stable without further      need for transfusion after the initial 3 units provided following      this procedure. He is thus felt stable for discharge home on twice      daily proton pump inhibitor, Protonix has been provided.  He is      also given a list of NSAIDs to avoid and instructions on a full      liquid diet by GI.  Follow-up as noted with plans to return to the      ER or MD if problems prior, see instructions below.  2. History of hypertension.  In the setting of acute GI bleed, the      patient's blood pressure has been low to normal after IV hydration      and blood resuscitation. We have held his lisinopril      hydrochlorothiazide during this hospitalization and his blood      pressure still 116/80 with a normal pulse of 88.  We will continue      holding this medication until follow-up with primary MD.  3. Acute blood loss anemia.  This is related to problem #1 above and      as stated he has received 3 units PRBC this hospitalization with      stabilization following treatment of his ulcer. Outpatient follow-      up as needed.  4. Other medical issues.  The patient may continue his other medical      issues as stated prior  to admission. Problems and meds are as      listed above.   Greater than 30 minutes on coordination and discharge planning and  instructions.      Valerie A. Felicity Coyer, MD  Electronically Signed     VAL/MEDQ  D:  08/20/2008  T:  08/20/2008  Job:  [id number]

## 2011-03-27 NOTE — Op Note (Signed)
NAMEJAYMES, HANG NO.:  [id number]   MEDICAL RECORD NO.:  [id number]          PATIENT TYPE:  AMB   LOCATION:  [facility]                         FACILITY:  [facility]   PHYSICIAN:  Bertram Millard. Dahlstedt, M.D.DATE OF BIRTH:  [birth year]   DATE OF PROCEDURE:  05/22/2007  DATE OF DISCHARGE:                               OPERATIVE REPORT   PREOPERATIVE DIAGNOSIS:  Left ureteral calculus.   POSTOPERATIVE DIAGNOSIS:  No evidence of calculus.   PRINCIPAL PROCEDURE:  1. Cysto.  2. Left retrograde pyelogram.  3. Left ureteroscopy.   SURGEON:  Bertram Millard. Dahlstedt, MD.   ANESTHESIA:  General with LMA.   COMPLICATIONS:  None.   BRIEF HISTORY:  A 65 year old male with a history of urolithiasis, with  intermittent but fairly severe left back and flank pain.  This has been  going on for quite a few weeks.  A CT-urogram was performed back in May.  I thought he had a pelvic phlebolith, but this was actually read out as  a distal ureteral stone.   As he has had persistent pain and non-passage of the stone, he presents  at this time for left retrograde, left ureteroscopy, and possible stone  extraction.  He is aware of risks and complications of the procedure and  desires to proceed.   DESCRIPTION OF PROCEDURE:  The patient was identified in the holding  area, his surgical side marked, and he was given preoperative IV  antibiotics.  He was taken to the operating room where a general  anesthetic was administered using the LMA.  He was placed in a dorsal  lithotomy position.  The genitalia and perineum were prepped and draped.  A #22-French panendoscope was advanced up the urethra, which was normal.  The prostate nonobstructed.  The bladder entered and inspected  circumferentially.  No tumors, trabeculations, or foreign bodies.  The  ureteral orifices normal in configuration and location.  The left ureter  was cannulated with a #6-French open end catheter.  A retrograde was  performed.  I did not see any filling defects within the ureter.  There  was a calcification outside of the left distal ureter just adjacent to  it.   Due to the patient's persistent pain, I did decide to proceed with  ureteroscopy.  This was performed with a #6-French short ureteroscope.  Using the guidewire for assistance, I got through the ureteral orifice  and up into the left ureter.  Along the whole course of the ureter, I  failed to see any stones.  At this point, I removed the scope looking  and during withdrawal.  Again, no ureteral stone or stricture was seen.   The bladder was drained, and the procedure terminated.   The patient tolerated the procedure well.   He will discharged on his at home pain medicine plus Bactrim-DS one p.o.  b.i.d. for three days.      Bertram Millard. Dahlstedt, M.D.  Electronically Signed     SMD/MEDQ  D:  05/22/2007  T:  05/22/2007  Job:  [id number]   cc:   Georgina Quint.  Plotnikov, MD  [address]  [place]  [place] [address]

## 2011-03-27 NOTE — Consult Note (Signed)
Cody Frazier, Cody Frazier               ACCOUNT NO.:  [id number]   MEDICAL RECORD NO.:  [id number]          PATIENT TYPE:  INP   LOCATION:  2008                         FACILITY:  [facility]   PHYSICIAN:  Nicolasa Ducking, ANP DATE OF BIRTH:  [birth year]   DATE OF CONSULTATION:  05/13/2008  DATE OF DISCHARGE:                                 CONSULTATION   PRIMARY CARDIOLOGIST:  [facility] being seen by Dr. Antoine Poche.   PRIMARY CARE Evah Rashid:  Georgina Quint. Plotnikov, MD   REQUESTING PHYSICIAN:  Hilda Lias, M.D.   PATIENT PROFILE:  A 65 year old Caucasian male status post lumbar  surgery complicated by acute blood loss anemia who has remained sinus  tachycardia with intermittent episodes of symptomatic paroxysmal atrial  fibrillation.   PROBLEMS:  1. Sinus tachycardia.  2. Presumed symptomatic paroxysmal atrial fibrillation.  3. Hypertension x15 years.  4. Hyperlipidemia x15 years.  5. History of sigmoid diverticular disease.  6. Degenerative joint disease/degenerative disk disease.  6A.  Status post L1-L2 and L5-S1 surgery in April 2004.  6B.  Status post L2-L5 and S1 laminectomy, facetectomy, and diskectomy  and fusion in May 07, 2008.  1. Scoliosis.  2. Chronic radiculopathy related to degenerative disk disease.  3. Postoperative anemia requiring 6 units of packed red blood cells.  4. Postoperative coagulopathy with INR up to 2.0 requiring 4 units of      fresh frozen plasma.  5. Peptic ulcer disease with history of bleeding antral and pyloric      ulcers resulted to be nonsteroidal antiinflammatory drug induced      2001.  6. History of colon polyps.  7. Nephrolithiasis.  8. History of renal cyst.   HISTORY AND PHYSICAL:  A 65 year old Caucasian male with prior cardiac  history.  He was admitted on May 07, 2008, for lumbar surgery following  long history of difficulty walking, back pain, and radiculopathy.   OPERATIVE COURSE:  The perioperative operative course  complicated by  significant blood loss approximately 5L and the patient remain intubated  postoperatively until May 09, 2008.  Postop course also complicated by  significant anemia as well as coagulopathy with INR up to 2.0.  He has  been treated with 6 units of packed red blood cells and 4 units of FFP  with now stabilization of his anemia and further evaluation of  coagulopathy.  GI was consulted secondary to some coffee-ground emesis  and he has been maintained on PPI.  His coffee grounds, we felt to be  secondary to NG tube trauma.  Hemoccult was also consulted and  coagulopathy labs were pending.  The patient has been tachycardic  throughout his postoperative course predominantly in sinus tachycardia  with rate up to the 130s to 140s.  He is asymptomatic while in sinus  tachycardia.  There were also reports in the chart paroxysmal atrial  fibrillation with rates as high as 200, although I have not been able to  see or find rhythm strips or ECG to document this.  We will check the  MUSE system.  The patient though has noted 3-4 occasions while  hospitalized where his heart rate suddenly increases associated with  shortness of breath, diaphoresis, and mild chest heaviness lasting for  45 minutes resolving with or after IV Lopressor.  He has not had any  such symptoms since yesterday.   ALLERGIES:  No known drug allergies.   CURRENT MEDICATIONS:  1. Albuterol inhaler q.i.d.  2. Colace 100 mg b.i.d.  3. Sliding scale insulin.  4. Protonix 40 mg b.i.d.  5. Lyrica 75 mg b.i.d.  6. Diltiazem drip 5 mg an hour.   FAMILY HISTORY:  Mother died at 55 of CVA, she also had colon cancer.  Father died at 37 with history of prostate cancer and colon cancer.  He  had a brother who died with history of CAD.   SOCIAL HISTORY:  He lives in [place] with his wife.  He is a  Statistician.  He has one grandchild.  He is a 5 pack a year  history of tobacco abuse quitting at age 71.   Denies any alcohol or  drugs.  He is not routinely exercising as his activities has been  limited by leg pain and back pain.   REVIEW OF SYSTEMS:  Positive for occasional tachy palpitations  throughout this hospitalization associated with shortness of breath,  chest heaviness, and diaphoresis.  He has had weakness postoperatively  as well as low back pain.  Otherwise, all systems revealed negative.   PHYSICAL EXAMINATION:  VITAL SIGNS:  Temperature 98.2, heart rate 116,  respirations 18, blood pressure 106/72, and pulse ox 95% on room air.  GENERAL:  Pleasant white male in no acute distress.  Awake and oriented  x3.  HEENT:  Normal.  NEURO:  Grossly intact, nonfocal.  Strength is 5/5 bilateral upper  extremities and 3/5 bilateral lower extremities.  SKIN:  Warm and dry.  There is no lesions or masses.  There is a  dressing to his midback which is dry and intact.  NECK:  No bruits or JVD.  LUNGS:  Respirations regular and labored.  Clear to auscultation.  CARDIAC:  Regular S1 and S2.  He is tachycardic.  There is 2/6 systolic  murmur at the left lower sternal border.  ABDOMEN:  Round, soft, nontender, and nondistended.  Bowel sounds  present.  EXTREMITIES:  Four extremities warm, dry, and pink.  No clubbing,  cyanosis, or edema.  Dorsalis pedis, posterior tibial pulse 2+ equal  bilaterally.   Chest x-ray on May 10, 2008, shows low lung volumes, no acute findings.  EKG on May 08, 2008, history of sinus tachycardia rate of 110, normal  axis, no acute ST, T changes.   Hemoglobin 9.8, hematocrit 28.3, WBC 10.3, and platelets 367.  Sodium  136, potassium 5.0, chloride 99, CO2 30, BUN 8, creatinine 0.86, and  glucose 131.  Total bilirubin 0.7, alkaline phosphatase 30, AST 57, ALT  27, total protein 3.8, albumin 2.4, calcium 9.0, magnesium 2.1, and  phosphorous 3.4.  Peak CK 1710, peak MB 27.6, and peak troponin I 0.40.  CK-MB ratio range only got as high as 1.6.   ASSESSMENT AND  PLAN:  1. Sinus tachycardia/atrial fibrillation with rapid ventricular      response.  He is currently in sinus tachy and he is asymptomatic.      He clearly differentiate between what he currently feel and a      faster rates that become associated with light headedness,      diaphoresis, chest pain, and shortness of breath, which have  previously been called atrial fibrillation.  We suspect both are      related to postoperative physiologic stress, anemia, etc.      Currently on low dose IV diltiazem.  We will change this to oral      beta-blocker as other issues continue to improve and stabilize and      he is feeling much better.  With anemia and blood loss in this      admission makes him poor anticoagulation candidate.  His [place] score      is only 1.  Check 2-D echocardiogram as well as TSH.  His      electrolytes and magnesium otherwise look okay.  As his cardiac      enzymes were elevated with mild troponin elevation as well as with      symptoms of dyspnea and chest pressure when his heart rate is up.      If this is satisfactory, we should consider a Myoview once the      other issues resolve to rule out ischemia.  2. Hypertension, stable.  3. Hyperlipidemia.  He is on simvastatin 80 mg at home.  Plan to      resume at some point.      Nicolasa Ducking, ANP     CB/MEDQ  D:  05/13/2008  T:  05/14/2008  Job:  [id number]

## 2011-03-27 NOTE — Consult Note (Signed)
NAMEFRAN, Cody Frazier               ACCOUNT NO.:  [id number]   MEDICAL RECORD NO.:  [id number]          PATIENT TYPE:  INP   LOCATION:  3104                         FACILITY:  [facility]   PHYSICIAN:  Lajuana Matte, MD  DATE OF BIRTH:  1946/05/03   DATE OF CONSULTATION:  05/10/2008  DATE OF DISCHARGE:                                 CONSULTATION   REASON FOR CONSULTATION:  Rule out bleeding disorder.   HISTORY OF PRESENT ILLNESS:  Cody Frazier is a pleasant 65 year old man  with no significant past medical history except for diskectomy at L1 and  L5-S1 who was admitted for elective lumbar spine fusion.  His CBC on  May 03, 2008 showed a platelet count of 346,000.  No PT or PTT  preoperatively during this admission.  During surgery the patient had a  significant blood loss estimated at 5 liters.  After surgery his PT was  23.5, INR 2, and PTT 52.  No DIC panel was done, but the platelet count  dropped to 95, hemoglobin 7.4, and hematocrit 21.2.  The patient  received 3 units of packed rbc's and 2 units of fresh frozen plasma.  Repeat PT and PTT were normal.  The patient also had coffee-ground  emesis for which GI bleed was suspected, and he is currently being  evaluated by GI physicians.  We were consulted for evaluating him for  his coagulopathy.  He had no current active bleeding problems, and his  PT and PTT on May 09, 2008 were normal.  His platelet count is  improving at 138,000.   PAST MEDICAL HISTORY:  Hypertension, history of degenerative disk  disease. sigmoid diverticular disease with no diverticulitis per CT of  the abdomen and pelvis in 2004 and in 2006, hyperlipidemia.   SURGERIES/PROCEDURES:  Status post extraforaminal diskectomy of L5-S1  and L1 in April 2004 by Dr. Jeral Fruit, status post bilateral L2-L5 fusion  Dr. Jeral Fruit May 07, 2008, status post appendectomy, status post  hemorrhoidectomy, status post hiatal hernia repair in the 1980's.   ALLERGIES:  NKDA.   MEDICATIONS:  Ventolin, Peridex, Colace, no longer takes ringers,  Protonix, Lyrica, Valium, fentanyl, Ativan, Maalox, Zofran, Senokot,  Ambien.   REVIEW OF SYSTEMS:  Remarkable for coffee-ground per NG tube,  questionable blood in the stools, right hip to right leg pain secondary  to back disease, constipation, some gait problems due to the spinal  disease.  No gum bleed or nose bleeds.  No NSAID's. Rest of the Review  of Systems is negative.   FAMILY HISTORY:  Mother died with a stroke at 15.  She had also a  history of colon cancer.  Father died with prostate cancer at 91.  He  also had a history of colon cancer.  One brother died with heart  disease.   SOCIAL HISTORY:  The patient is married.  He has one grown child in good  health.  No tobacco or alcohol history.  Lives in [place].  He is a  Cytogeneticist.   PHYSICAL EXAMINATION:  GENERAL:  This is a well-developed, well-  nourished 65 year old  white male in no acute distress, alert and  oriented x3.  VITAL SIGNS:  Blood pressure 139/79, pulse 108,  respirations 12, temperature 98.6, pulse oximetry 98% in 2 liters.  HEENT:  Normocephalic, atraumatic.  PERLA.  Oral cavity with no lesions  or thrush.  He has geographic tongue.  The  NG tube has mild coffee  ground in its cavity.  NECK:  Supple.  No cervical or supraclavicular masses.  LUNGS:  Clear to auscultation bilaterally.  No axillary masses.  CARDIOVASCULAR:  Regular rate and rhythm without murmurs, rubs or  gallops.  ABDOMEN:  Moderately obese, nontender.  Bowel sounds x4.  No  hepatosplenomegaly.  EXTREMITIES:  With no clubbing or cyanosis.  No edema.  No inguinal  masses.  SKIN:  Has no significant bruising or petechial rash.  The right hip is  tender to palpation.  BREASTS:  Not examined.  GU/RECTAL:  Deferred.  MUSCULOSKELETAL:  As mentioned above has some tenderness in the  postoperative area.  NEUROLOGICAL:  Nonfocal with the exception of tenderness  in the right  hip due to his prior disk condition.   LABORATORY DATA:  Hemoglobin 9.1, hematocrit 26.7, white count 15.1,  platelets 132, MCV 90.7.  PTT 24, PT 14.6, INR 1.1, fibrinogen 222.  Sodium 139, potassium 3.3, BUN 7, creatinine 0.75, glucose 115.  Alkaline phosphatase 30, total bilirubin 0.7. AST 57, ALT 27.  Total  protein 3.8.  Albumin 2.4.  Calcium 8.3.  TSH pending.  Magnesium 2.   Chest x-ray without masses.  Abdominal ultrasound in August 2008 was  negative.   ASSESSMENT/PLAN:  Dr. Arbutus Ped has seen and evaluated the patient and  reviewed the chart.  This is a 65 year old pleasant man asked to see to  rule out coagulopathy secondary to diffuse intravascular clotting during  surgery versus von Willebrand disorder.  Will check a von Willebrand  panel, check the factors VIII, V, and II.  Dr. Arbutus Ped will follow up  with you once the results become available.  If the patient were to be  discharged before these results became available, please arrange for a  follow-up visit at the [facility] with Dr. Arbutus Ped in the  next 1-2 weeks.  Thank you very much for allowing [place] the opportunity to  participate in his care.  The GI bleeding issues will continue as per  Dr. Marina Goodell.      Marlowe Kays, P.A.      Lajuana Matte, MD  Electronically Signed    SW/MEDQ  D:  05/11/2008  T:  05/11/2008  Job:  [id number]   cc:   Hilda Lias, M.D.  Venita Lick. Russella Dar, MD, Paticia Stack. Plotnikov, MD

## 2011-03-27 NOTE — Op Note (Signed)
NAMEAADEN, BUCKMAN               ACCOUNT NO.:  [id number]   MEDICAL RECORD NO.:  [id number]          PATIENT TYPE:  INP   LOCATION:  3104                         FACILITY:  [facility]   PHYSICIAN:  Hilda Lias, M.D.   DATE OF BIRTH:  12-29-1945   DATE OF PROCEDURE:  05/07/2008  DATE OF DISCHARGE:                               OPERATIVE REPORT   PREOPERATIVE DIAGNOSIS:  Degenerative disk disease L2-L3, L3-L4, L4-L5,  L5-S1 without chronic radiculopathy, scoliosis, status post L5-S1/S2  diskectomy.   POSTOPERATIVE DIAGNOSIS:  Degenerative disk disease L2-L3, L3-L4, L4-L5,  L5-S1 without chronic radiculopathy, scoliosis, status post L5-S1/S2  diskectomy.   PROCEDURES:  Bilateral L2, L3, L4, L5 laminectomy, facetectomy,  diskectomy at the level of L2-L3, L3-L4, L4-L5 on the right side.  Interbody fusion with cages 8 x 10 with autograft and allograft, pedicle  screw to L2 to S1, posterolateral arthrodesis with BMP and autograft.  Cell saver and C-arms.   SURGEON:  Hilda Lias, MD.   ASSISTANT:  Danae Orleans. Venetia Maxon, M.D.   CLINIC HISTORY:  Mr. Boley is a gentleman who in the past underwent  surgery in the upper lumbar area.  The patient has been complaining of  back pain radiating to both leg up to the point that literally he has  been walking with the cane.  The pain gets worse with activity.  X-ray  shows several degenerative disk disease from L2 down to L5-S1 with  scoliosis to the right side.  Surgery was advised.  The risks were  explained to him including possibility of no improvement, infection, CSF  leak, worsening pain.   PROCEDURE:  Mr. Plemmons was taken to the OR and he was positioned in a  prone manner.  The back was cleaned with DuraPrep.  Drapes were applied.  A midline incision was made from the upper incision,  and then all the  way down to L5-S1.  The muscle was retracted laterally.  One of the most  difficult part of the procedure was that Mr. Busche has a  tendency to  bleed.  We had the C-arm as well as the Cell Saver, but then  nevertheless any type of tissue bled including the bone itself.  The  patient obviously has a severe scoliosis.  We proceeded with laminectomy  and removal of spinous process of L2, L3, L4, L5.  The facets were  removed.  Because the scoliosis was mostly onto the left side, we opened  the disk at the level L2-3, L3-4, and L4-5 on the right side.  The area  was quite narrow.  A partial diskectomy, mostly on the right side, was  accomplished.  I have introduced 3 cages with BMP and autograft.  Then  using the C-arm, we probed the pedicles at L2, L3, L4, L5, and S1.  At  the end, we introduced  10 screws connected with caps and a rod.  AP and  lateral showed good position of the pedicle screws as well as the cages.  The patient has an acceptable correction of his scoliosis.  Then, we  brought the foramen.  Foramen otherwise accomplished with plenty of the  room for the L2, L3, L4, L5, and S1 nerve root.  Then, arthrodesis in  the lateral aspect between the facet and the transverse process from L2  to L5-S1 was done using the autograft as well as BMP.  A cross-link was  used from right to left  rod. Two Hemovac were left in the epidural  space and the area was closed  with Vicryl and nylon.  The patient has a small amount of  arachnoid  pouch, and Tisseel was left in the epidural space.  The patient, prior  to surgery, was inserted an NG tube, fluid came back which is most  likely an upper GI bleed.  The patient was transferred to intensive care  unit.           ______________________________  Hilda Lias, M.D.     EB/MEDQ  D:  05/07/2008  T:  05/08/2008  Job:  [id number]

## 2011-03-27 NOTE — Discharge Summary (Signed)
NAMEGRANT, SWAGER               ACCOUNT NO.:  [id number]   MEDICAL RECORD NO.:  [id number]          PATIENT TYPE:  INP   LOCATION:  2008                         FACILITY:  [facility]   PHYSICIAN:  Hilda Lias, M.D.   DATE OF BIRTH:  17-Feb-1946   DATE OF ADMISSION:  05/07/2008  DATE OF DISCHARGE:  05/18/2008                               DISCHARGE SUMMARY   PREOPERATIVE DIAGNOSES:  1. Degenerative disk disease L2-L3, L3-L4, L4-L5.  2. Also, rule out bleeding disorder.  3. Sinus tachycardia.  4. Pulseless electrical activity.  5. Atrial fibrillation.  6. Hypertension.   __________  Mr. Friesen was admitted to the hospital because of back pain radiates  to both legs.  X-ray showed severe degenerative disk disease at the  level 2-3, 3-4, 4-5.  Previously, he had surgery for a herniated disk  plus benign tumor.   LABORATORY:  Normal at the time of discharge.   COURSE IN THE HOSPITAL:  The patient was taken to the surgery and  diskectomy and fusion was done from L2 down to L5-S1.  The patient due  to surgery had quite a bit of bleeding secondary to his antiinflammatory  medication.  Nevertheless, although he was stable, he received 2 units  of blood.  We were quite __________ about the source of the bleeding and  he has a hematology consult and then because of irregular pulse he has  consult with the cardiologist.  The hematology workup was negative.  Eventually, the patient did improve.  His blood pressure remained normal  and he was discharged on May 18, 2008.   CONDITION ON DISCHARGE:  Improved.   MEDICATIONS:  Percocet and diazepam.  He was to continue taking his  prior medications.   ACTIVITY:  Not to drive.   FOLLOWUP:  He will be seen in my office in 2 weeks.           ______________________________  Hilda Lias, M.D.     EB/MEDQ  D:  06/08/2008  T:  06/09/2008  Job:  [id number]

## 2011-03-27 NOTE — Assessment & Plan Note (Signed)
Cody Frazier                           PRIMARY CARE OFFICE NOTE   Cody Frazier, Cody Frazier                  MRN:          [id number]  DATE:04/08/2007                            DOB:          [birth year]    The patient is a 65 year old male who presents for a wellness  examination.   Past medical history, family history, social history as per January 15, 2006 note.  In the interim, Cody Frazier passed a kidney stone  several times, the last time this past weekend.  Saw Dr. Retta Diones and  underwent a CT scan of the abdomen.  Was put on Cipro and Flomax.   CURRENT MEDICATIONS:  Reviewed with the patient.   REVIEW OF SYSTEMS:  No chest pain or shortness of breath.  Abdominal  pain has resolved by and large.  He remains fatigued.  Back pain is  under control on current regimen.  The rest of the 18-point review of  system is negative.   PHYSICAL EXAMINATION:  Blood pressure 135/85.  Pulse 94.  Temperature  98.4.  Weight 186 pounds.  He is no acute distress.  No pallor.  HEENT:  With moist mucosa.  NECK:  Supple.  No bruits.  LUNGS:  Clear.  No wheezes or rales.  HEART:  S1 and S2.  No gallop.  No murmur.  ABDOMEN:  Soft and non-tender.  No organomegaly or masses.  LOWER EXTREMITIES:  Without edema.  LS spine tender with range of motion.  Straight leg raise is negative.  No muscle atrophy.  Muscle strength within normal limits.  SKIN:  Clear with aging changes.   Labs Mar 24, 2007:  White count 13.8.  EKG:  No acute changes.  BMET  normal.  Amylase 63.  Lipase 18.  Cholesterol 123.  HDL 33.  LDL 59.  PSA 0.32.   ASSESSMENT AND PLAN:  1. Normal wellness examination.  Age/health-related issues discussed.      Healthy lifestyle discussed. Zostavax information provided.  He      will continue with exercise program, weight loss and healthy diet.  2. Low back pain.  Chronic with radiculopathy.  Continue current      therapy.  He is doing well on  current dose of Percocet.  Not      interested in other pain management modalities at present.  3. Nephrolithiasis with renal colic, improved.  He has a prescription      for Toradol to take 10 mg t.i.d. p.r.n., Demerol/Phenergan tablets      50/25 to use p.r.n. with extreme caution.  He also has Flomax, and      he is finishing Cipro.  Follow up with Dr. Retta Diones.  4. Elevated white count.  Likely due to kidney stones at the moment.  5. Dyslipidemia.  We will discontinue Vytorin, and start simvastatin      40 mg daily.  Obtain lab work in 3 months.     Cody Quint. Plotnikov, MD  Electronically Signed    AVP/MedQ  DD: 04/09/2007  DT: 04/09/2007  Job #: [id number]   cc:   Cody Millard.  Frazier, M.D.

## 2011-03-30 NOTE — H&P (Signed)
NAME:  Cody Frazier, Cody Frazier                         ACCOUNT NO.:  [id number]   MEDICAL RECORD NO.:  [id number]                   PATIENT TYPE:  INP   LOCATION:  NA                                   FACILITY:  [facility]   PHYSICIAN:  Hilda Lias, M.D.                DATE OF BIRTH:  09-04-1946   DATE OF ADMISSION:  02/24/2003  DATE OF DISCHARGE:                                HISTORY & PHYSICAL   HISTORY:  The patient is a gentleman who was seen by me initially in my  office on February 01, 2003, with a history of back pain with radiation down to  the thigh area on the right side.  The patient had been seen by a  chiropractor.  He is not any better.  We obtained an MRI of the lumbar spine  and the MRI was read as the possibility of a herniated disk at the level of  L1-L2.  Conservative treatment was begun, and he did not get any better.  I  was not happy with the MRI, and we went ahead and did a thoracolumbar MRI  which showed that indeed what he had was a tumor at the level of L1  compromising the L1 nerve root, and localized extraforaminal.  When he came  to see me, the pain was not only going to the right thigh, but also he has  distal pain posterolaterally all the way down to the right foot.  At the  lumbar spine at the level of L5-S1, we found a herniated disk and foraminal  stenosis.  Because the patient is not any better, he wants to proceed with  surgery.   PAST MEDICAL HISTORY:  1. Appendectomy.  2. Hernia repair of the esophagus.   SOCIAL HISTORY:  The patient does not smoke.  He does not drink.   FAMILY HISTORY:  Mother died of cancer of the colon.  Father died of cancer  of the prostate.   REVIEW OF SYSTEMS:  Positive for hypertension, high cholesterol, and GI  bleed twice, and back pain.   PHYSICAL EXAMINATION:  GENERAL:  The patient came to my office with his wife  and he was in quite a bit of pain.  He had difficulty standing.  HEENT:  Normal.  NECK:  Normal.  LUNGS:   Clear.  HEART:  Sounds are normal.  ABDOMEN:  Normal.  EXTREMITIES:  Normal pulses.  NEUROLOGIC:  Mental status normal.  Cranial nerves normal.  Strength:  He  has weakness of the thigh at the level of __________  The iliopsoas and  abductor being a 2/5 with __________  .  The reflexes were symmetrical at  1+.  Sensation:  He complains of numbness which involves the anterior part  of the thigh and also the outside of the right foot.  The MRI showed that he has at the level of L1 a tumor.  He has  extraforaminal neurofibroma with degenerative disk disease compromising the  L1 nerve root.  Also he has foraminal stenosis at the level of L5-S1 on the  right side which explains the S1 radiculopathy.   CLINICAL IMPRESSION:  1. Right L1 neurofibroma, extraforaminal.  2. L5-S1 stenosis with S1 radiculopathy.   RECOMMENDATIONS:  The patient is being admitted for surgery.  The procedure  will be the removal of tumor at L1.  He knows about the risks, such as  either the tumor is within the nerve itself, so that we have to cut the  nerve which probably will give some weakness of the leg and also permanent  numbness.  Also he knows of the possibility that doing the surgery at the L5-  S1 level, that he might have quite a bit of pain, stenosis, CSF leak, or  need for further surgery.  He declined another opinion.                                                Hilda Lias, M.D.    EB/MEDQ  D:  02/24/2003  T:  02/24/2003  Job:  [id number]

## 2011-03-30 NOTE — Op Note (Signed)
NAME:  Cody Frazier, Cody Frazier                         ACCOUNT NO.:  [id number]   MEDICAL RECORD NO.:  [id number]                   PATIENT TYPE:  INP   LOCATION:  2896                                 FACILITY:  [facility]   PHYSICIAN:  Hilda Lias, M.D.                DATE OF BIRTH:  18-May-1946   DATE OF PROCEDURE:  02/24/2003  DATE OF DISCHARGE:                                 OPERATIVE REPORT   PREOPERATIVE DIAGNOSES:  1. Right L1-L2 neurofibroma of L1, paravertebral, extraforaminal.  2. Right L5-S1 stenosis.   POSTOPERATIVE DIAGNOSES:  1. Right L1-S2 extraforaminal large herniated disk with a free fragment,     three of them.  2. Right L5-S1 stenosis.   OPERATION PERFORMED:  1. Right L1-L2 extraforaminal removal of large herniated disk two fragments.  2. Right L5-S1 foraminotomy.  Microscope, C-arm.   SURGEON:  Hilda Lias, M.D.   ASSISTANT:  Danae Orleans. Venetia Maxon, M.D.   ANESTHESIA:  General.   INDICATIONS FOR PROCEDURE:  The patient was admitted because of back and  right leg pain.  At the beginning, the pain was going to the right groin and  then later on down to the right foot.  The patient has failed conservative  treatment.  MRI showed the possibility of tumor at the level of L1 and 2 and  then stenosis at multiple levels, worse at level 5-1.  Patient wants to go  head with surgery.  The procedure was explained to him and his wife.   DESCRIPTION OF PROCEDURE:  The patient was taken to the operating room and  after intubation, he was positioned in a prone manner.  Because the worst  problem was at the level of L1 and 2 with the C-arm, we localized the area  with L1 and 2.  We infiltrated the vertebral area with Xylocaine and  incision away from the midline between the transverse process of L1 and 2  was made.  Using the Metrix, ________ were resected into that area.  We were  able to visualize the L1 level.  It was swollen, reddish but there was no  areas of any tumor.   Dissection was carried out paravertebrally and we found  that indeed there was two large free fragments of disk compromising not only  L1 but also the L2 nerve root.  Removal was done.  We then entered the disk  space and decompression of the disk was accomplished.  At the end, we had  plenty of room for the L1-L2 nerve root.  Then using the Metrix and with the  C-arm we localized the area between L5 and S1.  Dilator was inserted after  we did incision in the skin and we went straight down to the L5-S1 area.  With the drill, we drilled the lower  level of S1 and the upper of L5.  The  yellow ligament was also removed.  Foraminotomy  was accomplished.  Indeed,  we found most of the stenosis but no evidence of any herniated disk.  Having  done this, both areas were irrigated.  Fentanyl and Depo-Medrol were left in  the epidural space and the wound was closed with Vicryl and Steri-Strips.                                                Hilda Lias, M.D.    EB/MEDQ  D:  02/24/2003  T:  02/24/2003  Job:  [id number]

## 2011-04-23 ENCOUNTER — Telehealth: Payer: Self-pay | Admitting: *Deleted

## 2011-04-23 MED ORDER — OXYCODONE HCL 15 MG PO TABS: 15.0000 mg | ORAL_TABLET | Freq: Four times a day (QID) | ORAL | Status: DC | PRN

## 2011-04-23 NOTE — Telephone Encounter (Signed)
Patient requesting RF of Oxycodone 15 mg #120.

## 2011-04-23 NOTE — Telephone Encounter (Signed)
OK to fill this prescription with additional refills x0 Thank you!  

## 2011-04-23 NOTE — Telephone Encounter (Signed)
Pending signature

## 2011-04-24 NOTE — Telephone Encounter (Signed)
Done,  Patient informed

## 2011-05-03 ENCOUNTER — Other Ambulatory Visit (INDEPENDENT_AMBULATORY_CARE_PROVIDER_SITE_OTHER): Payer: BC Managed Care – PPO

## 2011-05-03 ENCOUNTER — Other Ambulatory Visit: Payer: Self-pay | Admitting: Internal Medicine

## 2011-05-03 DIAGNOSIS — E785 Hyperlipidemia, unspecified: Secondary | ICD-10-CM

## 2011-05-03 DIAGNOSIS — I10 Essential (primary) hypertension: Secondary | ICD-10-CM

## 2011-05-03 DIAGNOSIS — D509 Iron deficiency anemia, unspecified: Secondary | ICD-10-CM

## 2011-05-03 LAB — CBC WITH DIFFERENTIAL/PLATELET
Basophils Absolute: 0 10*3/uL (ref 0.0–0.1)
Basophils Relative: 0.4 % (ref 0.0–3.0)
Eosinophils Absolute: 0.1 10*3/uL (ref 0.0–0.7)
Eosinophils Relative: 1.4 % (ref 0.0–5.0)
HCT: 41.5 % (ref 39.0–52.0)
Hemoglobin: 14.2 g/dL (ref 13.0–17.0)
Lymphocytes Relative: 25.5 % (ref 12.0–46.0)
Lymphs Abs: 1.3 10*3/uL (ref 0.7–4.0)
MCHC: 34.3 g/dL (ref 30.0–36.0)
MCV: 92.7 fl (ref 78.0–100.0)
Monocytes Absolute: 0.4 10*3/uL (ref 0.1–1.0)
Monocytes Relative: 8.1 % (ref 3.0–12.0)
Neutro Abs: 3.2 10*3/uL (ref 1.4–7.7)
Neutrophils Relative %: 64.6 % (ref 43.0–77.0)
Platelets: 299 10*3/uL (ref 150.0–400.0)
RBC: 4.47 Mil/uL (ref 4.22–5.81)
RDW: 13.8 % (ref 11.5–14.6)
WBC: 5 10*3/uL (ref 4.5–10.5)

## 2011-05-03 LAB — COMPREHENSIVE METABOLIC PANEL
ALT: 22 U/L (ref 0–53)
AST: 19 U/L (ref 0–37)
Albumin: 4.4 g/dL (ref 3.5–5.2)
Alkaline Phosphatase: 99 U/L (ref 39–117)
BUN: 18 mg/dL (ref 6–23)
CO2: 27 mEq/L (ref 19–32)
Calcium: 10.1 mg/dL (ref 8.4–10.5)
Chloride: 104 mEq/L (ref 96–112)
Creatinine, Ser: 1.1 mg/dL (ref 0.4–1.5)
GFR: 74.37 mL/min (ref >60.00)
Glucose, Bld: 101 mg/dL — ABNORMAL HIGH (ref 70–99)
Potassium: 4.6 mEq/L (ref 3.5–5.1)
Sodium: 138 mEq/L (ref 135–145)
Total Bilirubin: 0.6 mg/dL (ref 0.3–1.2)
Total Protein: 6.7 g/dL (ref 6.0–8.3)

## 2011-05-03 LAB — LIPID PANEL
Cholesterol: 275 mg/dL — ABNORMAL HIGH (ref 0–200)
HDL: 55 mg/dL (ref >39.00)
Total CHOL/HDL Ratio: 5
Triglycerides: 167 mg/dL — ABNORMAL HIGH (ref 0.0–149.0)
VLDL: 33.4 mg/dL (ref 0.0–40.0)

## 2011-05-03 LAB — LDL CHOLESTEROL, DIRECT: Direct LDL: 163.3 mg/dL

## 2011-05-10 ENCOUNTER — Encounter: Payer: Self-pay | Admitting: Internal Medicine

## 2011-05-11 ENCOUNTER — Other Ambulatory Visit (INDEPENDENT_AMBULATORY_CARE_PROVIDER_SITE_OTHER): Payer: BC Managed Care – PPO

## 2011-05-11 ENCOUNTER — Ambulatory Visit (INDEPENDENT_AMBULATORY_CARE_PROVIDER_SITE_OTHER): Payer: BC Managed Care – PPO | Admitting: Internal Medicine

## 2011-05-11 ENCOUNTER — Encounter: Payer: Self-pay | Admitting: Internal Medicine

## 2011-05-11 VITALS — BP 120/90 | HR 84 | Temp 98.2°F | Resp 16 | Ht 70.0 in | Wt 179.0 lb

## 2011-05-11 DIAGNOSIS — R5381 Other malaise: Secondary | ICD-10-CM

## 2011-05-11 DIAGNOSIS — L570 Actinic keratosis: Secondary | ICD-10-CM

## 2011-05-11 DIAGNOSIS — R5383 Other fatigue: Secondary | ICD-10-CM

## 2011-05-11 DIAGNOSIS — K279 Peptic ulcer, site unspecified, unspecified as acute or chronic, without hemorrhage or perforation: Secondary | ICD-10-CM

## 2011-05-11 DIAGNOSIS — M545 Low back pain, unspecified: Secondary | ICD-10-CM

## 2011-05-11 DIAGNOSIS — K259 Gastric ulcer, unspecified as acute or chronic, without hemorrhage or perforation: Secondary | ICD-10-CM

## 2011-05-11 DIAGNOSIS — K2270 Barrett's esophagus without dysplasia: Secondary | ICD-10-CM

## 2011-05-11 LAB — TESTOSTERONE: Testosterone: 178.21 ng/dL — ABNORMAL LOW (ref 350.00–890.00)

## 2011-05-11 MED ORDER — OXYCODONE HCL 15 MG PO TABS: 15.0000 mg | ORAL_TABLET | Freq: Four times a day (QID) | ORAL | Status: DC | PRN

## 2011-05-11 NOTE — Progress Notes (Signed)
Subjective:    Patient ID: Cody Frazier, male    DOB: 1946/07/22, 65 y.o.   MRN: [id number]  HPI   The patient is here to follow up on chronic LBP, depression, anxiety, headaches and chronic moderate fibromyalgia symptoms controlled with medicines, diet and exercise.   Review of Systems  Constitutional: Negative for appetite change, fatigue and unexpected weight change.  HENT: Negative for nosebleeds, congestion, sore throat, sneezing, trouble swallowing and neck pain.   Eyes: Negative for itching and visual disturbance.  Respiratory: Negative for cough.   Cardiovascular: Negative for chest pain, palpitations and leg swelling.  Gastrointestinal: Negative for nausea, diarrhea, blood in stool and abdominal distention.  Genitourinary: Negative for frequency and hematuria.  Musculoskeletal: Positive for back pain. Negative for joint swelling and gait problem.  Skin: Negative for rash.  Neurological: Negative for dizziness, tremors, speech difficulty and weakness.  Psychiatric/Behavioral: Negative for suicidal ideas, confusion, sleep disturbance, dysphoric mood and agitation. The patient is nervous/anxious.        Objective:   Physical Exam  Constitutional: He is oriented to person, place, and time. He appears well-developed.  HENT:  Mouth/Throat: Oropharynx is clear and moist.  Eyes: Conjunctivae are normal. Pupils are equal, round, and reactive to light.  Neck: Normal range of motion. No JVD present. No thyromegaly present.  Cardiovascular: Normal rate, regular rhythm, normal heart sounds and intact distal pulses.  Exam reveals no gallop and no friction rub.   No murmur heard. Pulmonary/Chest: Effort normal and breath sounds normal. No respiratory distress. He has no wheezes. He has no rales. He exhibits no tenderness.  Abdominal: Soft. Bowel sounds are normal. He exhibits no distension and no mass. There is no tenderness. There is no rebound and no guarding.  Musculoskeletal:  Normal range of motion. He exhibits tenderness (LS is tender). He exhibits no edema.  Lymphadenopathy:    He has no cervical adenopathy.  Neurological: He is alert and oriented to person, place, and time. He has normal reflexes. No cranial nerve deficit. He exhibits normal muscle tone. Coordination normal.  Skin: Skin is warm and dry. Rash (AK on nose) noted.  Psychiatric: He has a normal mood and affect. His behavior is normal. Judgment and thought content normal.   Wt Readings from Last 3 Encounters:  05/11/11 179 lb (81.194 kg)  02/09/11 171 lb (77.565 kg)  12/21/10 173 lb (78.472 kg)         Lab Results  Component Value Date   WBC 5.0 05/03/2011   HGB 14.2 05/03/2011   HCT 41.5 05/03/2011   PLT 299.0 05/03/2011   CHOL 275* 05/03/2011   TRIG 167.0* 05/03/2011   HDL 55.00 05/03/2011   LDLDIRECT 163.3 05/03/2011   ALT 22 05/03/2011   AST 19 05/03/2011   NA 138 05/03/2011   K 4.6 05/03/2011   CL 104 05/03/2011   CREATININE 1.1 05/03/2011   BUN 18 05/03/2011   CO2 27 05/03/2011   TSH 0.59 02/01/2011   PSA 0.14 02/01/2011   INR 1.24 04/17/2010   HGBA1C 5.5 04/30/2008     Assessment & Plan:     Procedure Note :     Procedure : Cryosurgery   Indication:    Actinic keratosis(es) x1   Risks including unsuccessful procedure , bleeding, infection, bruising, scar, a need for a repeat  procedure and others were explained to the patient in detail as well as the benefits. Informed consent was obtained verbally.    1  lesion(s)  on  nose   was/were treated with liquid nitrogen on a Q-tip in a usual fasion . Band-Aid was applied and antibiotic ointment was given for a later use.   Tolerated well. Complications none.   Postprocedure instructions :     Keep the wounds clean. You can wash them with liquid soap and water. Pat dry with gauze or a Kleenex tissue  Before applying antibiotic ointment and a Band-Aid.   You need to report immediately  if  any signs of infection develop.

## 2011-05-13 NOTE — Assessment & Plan Note (Signed)
Better now 

## 2011-05-13 NOTE — Assessment & Plan Note (Signed)
See Meds  Potential benefits of a long term opioids use as well as potential risks (i.e. addiction risk, apnea etc) and complications (i.e. Somnolence, constipation and others) were explained to the patient and were aknowledged.

## 2011-05-13 NOTE — Assessment & Plan Note (Signed)
On Rx 

## 2011-05-13 NOTE — Assessment & Plan Note (Signed)
Nose - will use liquid N2

## 2011-05-14 ENCOUNTER — Telehealth: Payer: Self-pay | Admitting: Internal Medicine

## 2011-05-14 NOTE — Telephone Encounter (Signed)
Cody Frazier, please, inform patient that his testost was low. RTC this month with prolactin, FSH to discuss Rx Thx

## 2011-05-17 NOTE — Telephone Encounter (Signed)
Pt informed

## 2011-05-21 ENCOUNTER — Other Ambulatory Visit (INDEPENDENT_AMBULATORY_CARE_PROVIDER_SITE_OTHER): Payer: BC Managed Care – PPO

## 2011-05-21 ENCOUNTER — Other Ambulatory Visit: Payer: Self-pay | Admitting: *Deleted

## 2011-05-21 DIAGNOSIS — E291 Testicular hypofunction: Secondary | ICD-10-CM

## 2011-05-21 LAB — PROLACTIN: Prolactin: 40.9 ng/mL — ABNORMAL HIGH (ref 2.1–17.1)

## 2011-05-21 LAB — TESTOSTERONE: Testosterone: 251.92 ng/dL — ABNORMAL LOW (ref 350.00–890.00)

## 2011-05-21 LAB — FOLLICLE STIMULATING HORMONE: FSH: 40.2 m[IU]/mL — ABNORMAL HIGH (ref 1.4–18.1)

## 2011-05-24 ENCOUNTER — Encounter: Payer: Self-pay | Admitting: Internal Medicine

## 2011-05-25 ENCOUNTER — Encounter: Payer: Self-pay | Admitting: Internal Medicine

## 2011-05-25 ENCOUNTER — Ambulatory Visit (INDEPENDENT_AMBULATORY_CARE_PROVIDER_SITE_OTHER): Payer: BC Managed Care – PPO | Admitting: Internal Medicine

## 2011-05-25 VITALS — BP 130/98 | HR 80 | Temp 99.6°F | Resp 16 | Wt 178.0 lb

## 2011-05-25 DIAGNOSIS — R5383 Other fatigue: Secondary | ICD-10-CM

## 2011-05-25 DIAGNOSIS — E291 Testicular hypofunction: Secondary | ICD-10-CM

## 2011-05-25 DIAGNOSIS — R5381 Other malaise: Secondary | ICD-10-CM

## 2011-05-25 MED ORDER — TESTOSTERONE 20.25 MG/ACT (1.62%) TD GEL: 2.0000 | TRANSDERMAL | Status: DC

## 2011-05-27 ENCOUNTER — Encounter: Payer: Self-pay | Admitting: Internal Medicine

## 2011-05-27 DIAGNOSIS — E291 Testicular hypofunction: Secondary | ICD-10-CM | POA: Insufficient documentation

## 2011-05-27 NOTE — Assessment & Plan Note (Signed)
Hypogonadism Rx should help

## 2011-05-27 NOTE — Progress Notes (Signed)
  Subjective:    Patient ID: Cody Frazier, male    DOB: 1946-04-01, 65 y.o.   MRN: [id number]  HPI  F/u a new dx of hypogonadism by labs - to discuss Rx     Review of Systems  Constitutional: Negative for appetite change, fatigue and unexpected weight change.  HENT: Negative for nosebleeds, congestion, sore throat, sneezing, trouble swallowing and neck pain.   Eyes: Negative for itching and visual disturbance.  Respiratory: Negative for cough.   Cardiovascular: Negative for chest pain, palpitations and leg swelling.  Gastrointestinal: Negative for nausea, diarrhea, blood in stool and abdominal distention.  Genitourinary: Negative for frequency and hematuria.  Musculoskeletal: Positive for back pain. Negative for joint swelling and gait problem.  Skin: Negative for rash.  Neurological: Negative for dizziness, tremors, speech difficulty and weakness.  Psychiatric/Behavioral: Negative for suicidal ideas, confusion, sleep disturbance, dysphoric mood and agitation. The patient is nervous/anxious.        Objective:   Physical Exam  Constitutional: He is oriented to person, place, and time. He appears well-developed.  HENT:  Mouth/Throat: Oropharynx is clear and moist.  Eyes: Conjunctivae are normal. Pupils are equal, round, and reactive to light.  Neck: Normal range of motion. No JVD present. No thyromegaly present.  Cardiovascular: Normal rate, regular rhythm, normal heart sounds and intact distal pulses.  Exam reveals no gallop and no friction rub.   No murmur heard. Pulmonary/Chest: Effort normal and breath sounds normal. No respiratory distress. He has no wheezes. He has no rales. He exhibits no tenderness.  Abdominal: Soft. Bowel sounds are normal. He exhibits no distension and no mass. There is no tenderness. There is no rebound and no guarding.  Musculoskeletal: Normal range of motion. He exhibits tenderness (LS is tender). He exhibits no edema.  Lymphadenopathy:    He has  no cervical adenopathy.  Neurological: He is alert and oriented to person, place, and time. He has normal reflexes. No cranial nerve deficit. He exhibits normal muscle tone. Coordination normal.  Skin: Skin is warm and dry. No rash (AK on nose) noted.  Psychiatric: He has a normal mood and affect. His behavior is normal. Judgment and thought content normal.             Assessment & Plan:

## 2011-05-27 NOTE — Assessment & Plan Note (Signed)
Rx options were discussed. Start Androgel

## 2011-07-25 ENCOUNTER — Telehealth: Payer: Self-pay | Admitting: *Deleted

## 2011-07-25 MED ORDER — OXYCODONE HCL 15 MG PO TABS: 15.0000 mg | ORAL_TABLET | Freq: Four times a day (QID) | ORAL | Status: DC | PRN

## 2011-07-25 NOTE — Telephone Encounter (Signed)
Pt is req Rf on Oxycodone 15mg . # 120. Ok to Rf?

## 2011-07-25 NOTE — Telephone Encounter (Signed)
Pending signature

## 2011-07-25 NOTE — Telephone Encounter (Signed)
OK to fill this prescription with additional refills x0 Thank you!  

## 2011-07-26 NOTE — Telephone Encounter (Signed)
Rx ready for p/u. Left detailed mess informing pt rx ready.

## 2011-08-07 ENCOUNTER — Telehealth: Payer: Self-pay | Admitting: *Deleted

## 2011-08-07 NOTE — Telephone Encounter (Signed)
Spoke with pt, answered questions about upcoming apt and labs.

## 2011-08-08 ENCOUNTER — Other Ambulatory Visit (INDEPENDENT_AMBULATORY_CARE_PROVIDER_SITE_OTHER): Payer: BC Managed Care – PPO

## 2011-08-08 DIAGNOSIS — E291 Testicular hypofunction: Secondary | ICD-10-CM

## 2011-08-08 LAB — COMPREHENSIVE METABOLIC PANEL
ALT: 27 U/L (ref 0–53)
AST: 32 U/L (ref 0–37)
Albumin: 3.9 g/dL (ref 3.5–5.2)
Alkaline Phosphatase: 100 U/L (ref 39–117)
BUN: 14 mg/dL (ref 6–23)
CO2: 28 mEq/L (ref 19–32)
Calcium: 9.7 mg/dL (ref 8.4–10.5)
Chloride: 106 mEq/L (ref 96–112)
Creatinine, Ser: 1.1 mg/dL (ref 0.4–1.5)
GFR: 74.31 mL/min (ref >60.00)
Glucose, Bld: 89 mg/dL (ref 70–99)
Potassium: 4.6 mEq/L (ref 3.5–5.1)
Sodium: 141 mEq/L (ref 135–145)
Total Bilirubin: 0.9 mg/dL (ref 0.3–1.2)
Total Protein: 7.1 g/dL (ref 6.0–8.3)

## 2011-08-08 LAB — CBC WITH DIFFERENTIAL/PLATELET
Basophils Absolute: 0 10*3/uL (ref 0.0–0.1)
Basophils Relative: 0.2 % (ref 0.0–3.0)
Eosinophils Absolute: 0 10*3/uL (ref 0.0–0.7)
Eosinophils Relative: 0.9 % (ref 0.0–5.0)
HCT: 43.5 % (ref 39.0–52.0)
Hemoglobin: 14.5 g/dL (ref 13.0–17.0)
Lymphocytes Relative: 21 % (ref 12.0–46.0)
Lymphs Abs: 1 10*3/uL (ref 0.7–4.0)
MCHC: 33.3 g/dL (ref 30.0–36.0)
MCV: 92.4 fl (ref 78.0–100.0)
Monocytes Absolute: 0.4 10*3/uL (ref 0.1–1.0)
Monocytes Relative: 8.4 % (ref 3.0–12.0)
Neutro Abs: 3.4 10*3/uL (ref 1.4–7.7)
Neutrophils Relative %: 69.5 % (ref 43.0–77.0)
Platelets: 301 10*3/uL (ref 150.0–400.0)
RBC: 4.71 Mil/uL (ref 4.22–5.81)
RDW: 12.8 % (ref 11.5–14.6)
WBC: 4.9 10*3/uL (ref 4.5–10.5)

## 2011-08-08 LAB — TESTOSTERONE: Testosterone: 606.93 ng/dL (ref 350.00–890.00)

## 2011-08-09 LAB — BASIC METABOLIC PANEL
BUN: 12
BUN: 12
BUN: 7
BUN: 8
BUN: 13
BUN: 8
BUN: 8
CO2: 24
CO2: 27
CO2: 29
CO2: 31
CO2: 29
CO2: 30
CO2: 30
Calcium: 10
Calcium: 8.2 — ABNORMAL LOW
Calcium: 8.3 — ABNORMAL LOW
Calcium: 8.6
Calcium: 8.5
Calcium: 9
Calcium: 9.2
Chloride: 101
Chloride: 101
Chloride: 107
Chloride: 107
Chloride: 101
Chloride: 99
Chloride: 99
Creatinine, Ser: 0.75
Creatinine, Ser: 0.75
Creatinine, Ser: 0.84
Creatinine, Ser: 1.11
Creatinine, Ser: 0.76
Creatinine, Ser: 0.86
Creatinine, Ser: 0.9
GFR calc Af Amer: >60
GFR calc Af Amer: >60
GFR calc Af Amer: >60
GFR calc Af Amer: >60
GFR calc Af Amer: >60
GFR calc Af Amer: >60
GFR calc Af Amer: >60
GFR calc non Af Amer: >60
GFR calc non Af Amer: >60
GFR calc non Af Amer: >60
GFR calc non Af Amer: >60
GFR calc non Af Amer: >60
GFR calc non Af Amer: >60
GFR calc non Af Amer: >60
Glucose, Bld: 102 — ABNORMAL HIGH
Glucose, Bld: 115 — ABNORMAL HIGH
Glucose, Bld: 130 — ABNORMAL HIGH
Glucose, Bld: 93
Glucose, Bld: 122 — ABNORMAL HIGH
Glucose, Bld: 124 — ABNORMAL HIGH
Glucose, Bld: 131 — ABNORMAL HIGH
Potassium: 3.3 — ABNORMAL LOW
Potassium: 3.3 — ABNORMAL LOW
Potassium: 3.4 — ABNORMAL LOW
Potassium: 4.9
Potassium: 3.2 — ABNORMAL LOW
Potassium: 4.2
Potassium: 5
Sodium: 138
Sodium: 139
Sodium: 139
Sodium: 140
Sodium: 135
Sodium: 136
Sodium: 138

## 2011-08-09 LAB — TYPE AND SCREEN
ABO/RH(D): O POS
Antibody Screen: NEGATIVE

## 2011-08-09 LAB — CBC
HCT: 21.2 — ABNORMAL LOW
HCT: 24.4 — ABNORMAL LOW
HCT: 26.4 — ABNORMAL LOW
HCT: 26.7 — ABNORMAL LOW
HCT: 27 — ABNORMAL LOW
HCT: 27 — ABNORMAL LOW
HCT: 28.3 — ABNORMAL LOW
HCT: 29 — ABNORMAL LOW
HCT: 32.7 — ABNORMAL LOW
HCT: 41.4
HCT: 27.2 — ABNORMAL LOW
HCT: 27.8 — ABNORMAL LOW
HCT: 28.3 — ABNORMAL LOW
Hemoglobin: 11.5 — ABNORMAL LOW
Hemoglobin: 14.5
Hemoglobin: 7.4 — CL
Hemoglobin: 8.3 — ABNORMAL LOW
Hemoglobin: 9 — ABNORMAL LOW
Hemoglobin: 9.1 — ABNORMAL LOW
Hemoglobin: 9.3 — ABNORMAL LOW
Hemoglobin: 9.5 — ABNORMAL LOW
Hemoglobin: 9.6 — ABNORMAL LOW
Hemoglobin: 9.9 — ABNORMAL LOW
Hemoglobin: 9.3 — ABNORMAL LOW
Hemoglobin: 9.4 — ABNORMAL LOW
Hemoglobin: 9.8 — ABNORMAL LOW
MCHC: 34
MCHC: 34.1
MCHC: 34.1
MCHC: 34.2
MCHC: 34.2
MCHC: 34.4
MCHC: 34.7
MCHC: 35
MCHC: 35
MCHC: 35.1
MCHC: 33.8
MCHC: 34.2
MCHC: 34.6
MCV: 89.2
MCV: 89.2
MCV: 89.7
MCV: 89.8
MCV: 89.9
MCV: 90.5
MCV: 90.6
MCV: 90.7
MCV: 90.7
MCV: 90.9
MCV: 90.9
MCV: 91.3
MCV: 91.4
Platelets: 102 — ABNORMAL LOW
Platelets: 104 — ABNORMAL LOW
Platelets: 108 — ABNORMAL LOW
Platelets: 112 — ABNORMAL LOW
Platelets: 132 — ABNORMAL LOW
Platelets: 138 — ABNORMAL LOW
Platelets: 217
Platelets: 346
Platelets: 89 — ABNORMAL LOW
Platelets: 95 — ABNORMAL LOW
Platelets: 277
Platelets: 367
Platelets: 617 — ABNORMAL HIGH
RBC: 2.33 — ABNORMAL LOW
RBC: 2.71 — ABNORMAL LOW
RBC: 2.94 — ABNORMAL LOW
RBC: 2.94 — ABNORMAL LOW
RBC: 3.01 — ABNORMAL LOW
RBC: 3.03 — ABNORMAL LOW
RBC: 3.17 — ABNORMAL LOW
RBC: 3.2 — ABNORMAL LOW
RBC: 3.61 — ABNORMAL LOW
RBC: 4.56
RBC: 2.98 — ABNORMAL LOW
RBC: 3.06 — ABNORMAL LOW
RBC: 3.09 — ABNORMAL LOW
RDW: 12.5
RDW: 12.9
RDW: 13
RDW: 13.9
RDW: 14.2
RDW: 14.2
RDW: 14.4
RDW: 14.5
RDW: 14.5
RDW: 14.6
RDW: 13.3
RDW: 13.9
RDW: 14
WBC: 11.5 — ABNORMAL HIGH
WBC: 12.4 — ABNORMAL HIGH
WBC: 14.2 — ABNORMAL HIGH
WBC: 14.3 — ABNORMAL HIGH
WBC: 15.1 — ABNORMAL HIGH
WBC: 16 — ABNORMAL HIGH
WBC: 17 — ABNORMAL HIGH
WBC: 6.7
WBC: 6.7
WBC: 8.8
WBC: 10.3
WBC: 9
WBC: 9.4

## 2011-08-09 LAB — PREPARE FRESH FROZEN PLASMA

## 2011-08-09 LAB — POCT I-STAT 7, (LYTES, BLD GAS, ICA,H+H)
Acid-base deficit: 4 — ABNORMAL HIGH
Bicarbonate: 21.9
Bicarbonate: 24.6 — ABNORMAL HIGH
Bicarbonate: 25 — ABNORMAL HIGH
Calcium, Ion: 0.94 — ABNORMAL LOW
Calcium, Ion: 1.1 — ABNORMAL LOW
Calcium, Ion: 1.4 — ABNORMAL HIGH
HCT: 22 — ABNORMAL LOW
HCT: 34 — ABNORMAL LOW
HCT: 34 — ABNORMAL LOW
Hemoglobin: 11.6 — ABNORMAL LOW
Hemoglobin: 11.6 — ABNORMAL LOW
Hemoglobin: 7.5 — CL
O2 Saturation: 100
O2 Saturation: 100
O2 Saturation: 100
Operator id: 139621
Operator id: 140821
Operator id: 140821
Patient temperature: 36.2
Patient temperature: 36.7
Patient temperature: 37.4
Potassium: 3.9
Potassium: 4.1
Potassium: 4.5
Sodium: 140
Sodium: 140
Sodium: 141
TCO2: 23
TCO2: 26
TCO2: 26
pCO2 arterial: 39.6
pCO2 arterial: 41.9
pCO2 arterial: 43.9
pH, Arterial: 7.309 — ABNORMAL LOW
pH, Arterial: 7.382
pH, Arterial: 7.398
pO2, Arterial: 582 — ABNORMAL HIGH
pO2, Arterial: 623 — ABNORMAL HIGH
pO2, Arterial: 655 — ABNORMAL HIGH

## 2011-08-09 LAB — COMPREHENSIVE METABOLIC PANEL
ALT: 18
ALT: 27
AST: 35
AST: 57 — ABNORMAL HIGH
Albumin: 2.4 — ABNORMAL LOW
Albumin: 2.4 — ABNORMAL LOW
Alkaline Phosphatase: 30 — ABNORMAL LOW
Alkaline Phosphatase: 30 — ABNORMAL LOW
BUN: 19
BUN: 21
CO2: 22
CO2: 22
Calcium: 7.9 — ABNORMAL LOW
Calcium: 8.4
Chloride: 111
Chloride: 112
Creatinine, Ser: 1.11
Creatinine, Ser: 1.28
GFR calc Af Amer: >60
GFR calc Af Amer: >60
GFR calc non Af Amer: 57 — ABNORMAL LOW
GFR calc non Af Amer: >60
Glucose, Bld: 147 — ABNORMAL HIGH
Glucose, Bld: 197 — ABNORMAL HIGH
Potassium: 3.4 — ABNORMAL LOW
Potassium: 3.4 — ABNORMAL LOW
Sodium: 141
Sodium: 142
Total Bilirubin: 0.7
Total Bilirubin: 1.4 — ABNORMAL HIGH
Total Protein: 3.4 — ABNORMAL LOW
Total Protein: 3.8 — ABNORMAL LOW

## 2011-08-09 LAB — VON WILLEBRAND PANEL
Factor-VIII Activity: 204 % — ABNORMAL HIGH (ref 50–150)
Ristocetin Co-Factor: >150 % (ref 50–150)
Von Willebrand Ag: 339 % normal — ABNORMAL HIGH (ref 61–164)

## 2011-08-09 LAB — TSH: TSH: 1.18 (ref 0.350–4.500)

## 2011-08-09 LAB — POCT I-STAT 4, (NA,K, GLUC, HGB,HCT)
Glucose, Bld: 104 — ABNORMAL HIGH
Glucose, Bld: 193 — ABNORMAL HIGH
HCT: 30 — ABNORMAL LOW
HCT: 31 — ABNORMAL LOW
Hemoglobin: 10.2 — ABNORMAL LOW
Hemoglobin: 10.5 — ABNORMAL LOW
Operator id: 140821
Operator id: 140821
Potassium: 4
Potassium: 4.1
Sodium: 138
Sodium: 144

## 2011-08-09 LAB — POCT I-STAT 3, ART BLOOD GAS (G3+)
Acid-base deficit: 3 — ABNORMAL HIGH
Bicarbonate: 22.8
O2 Saturation: 100
Operator id: 140821
Patient temperature: 37.4
TCO2: 24
pCO2 arterial: 43
pH, Arterial: 7.334 — ABNORMAL LOW
pO2, Arterial: 606 — ABNORMAL HIGH

## 2011-08-09 LAB — BLOOD GAS, ARTERIAL
Acid-base deficit: 0.6
Bicarbonate: 23.3
FIO2: 1
MECHVT: 600
O2 Saturation: 99.7
PEEP: 5
Patient temperature: 98.6
RATE: 12
TCO2: 24.4
pCO2 arterial: 36.7
pH, Arterial: 7.419
pO2, Arterial: 394 — ABNORMAL HIGH

## 2011-08-09 LAB — CARDIAC PANEL(CRET KIN+CKTOT+MB+TROPI)
CK, MB: 14.5 — ABNORMAL HIGH
CK, MB: 17.5 — ABNORMAL HIGH
CK, MB: 27.6 — ABNORMAL HIGH
CK, MB: 8 — ABNORMAL HIGH
Relative Index: 1
Relative Index: 1.3
Relative Index: 1.4
Relative Index: 1.6
Total CK: 1037 — ABNORMAL HIGH
Total CK: 1329 — ABNORMAL HIGH
Total CK: 1710 — ABNORMAL HIGH
Total CK: 797 — ABNORMAL HIGH
Troponin I: 0.07 — ABNORMAL HIGH
Troponin I: 0.08 — ABNORMAL HIGH
Troponin I: 0.09 — ABNORMAL HIGH
Troponin I: 0.4 — ABNORMAL HIGH

## 2011-08-09 LAB — PREPARE RBC (CROSSMATCH)

## 2011-08-09 LAB — PROTIME-INR
INR: 1.1
INR: 1.2
INR: 1.4
INR: 2 — ABNORMAL HIGH
Prothrombin Time: 14.6
Prothrombin Time: 15.5 — ABNORMAL HIGH
Prothrombin Time: 17.2 — ABNORMAL HIGH
Prothrombin Time: 23.5 — ABNORMAL HIGH

## 2011-08-09 LAB — APTT
aPTT: 24
aPTT: 24
aPTT: 52 — ABNORMAL HIGH

## 2011-08-09 LAB — FIBRINOGEN: Fibrinogen: 222

## 2011-08-09 LAB — FACTOR 8 ASSAY: Coagulation Factor VIII: 600 — ABNORMAL HIGH

## 2011-08-09 LAB — PHOSPHORUS
Phosphorus: 2.7
Phosphorus: 2.8
Phosphorus: 3
Phosphorus: 3.1
Phosphorus: 3.4

## 2011-08-09 LAB — MAGNESIUM
Magnesium: 1.8
Magnesium: 2
Magnesium: 2.1
Magnesium: 2
Magnesium: 2.1

## 2011-08-09 LAB — ABO/RH: ABO/RH(D): O POS

## 2011-08-09 LAB — FACTOR 7 ASSAY: Factor VII Activity: 134 % (ref 80–181)

## 2011-08-09 LAB — LACTIC ACID, PLASMA: Lactic Acid, Venous: 3 — ABNORMAL HIGH

## 2011-08-09 LAB — FACTOR 5 ASSAY: Factor V Activity: 107 % (ref 62–140)

## 2011-08-13 ENCOUNTER — Encounter: Payer: Self-pay | Admitting: Internal Medicine

## 2011-08-13 ENCOUNTER — Ambulatory Visit (INDEPENDENT_AMBULATORY_CARE_PROVIDER_SITE_OTHER): Payer: BC Managed Care – PPO | Admitting: Internal Medicine

## 2011-08-13 VITALS — BP 140/98 | HR 76 | Temp 98.4°F | Resp 16 | Wt 185.0 lb

## 2011-08-13 DIAGNOSIS — E291 Testicular hypofunction: Secondary | ICD-10-CM

## 2011-08-13 DIAGNOSIS — Z23 Encounter for immunization: Secondary | ICD-10-CM

## 2011-08-13 DIAGNOSIS — I10 Essential (primary) hypertension: Secondary | ICD-10-CM

## 2011-08-13 DIAGNOSIS — R5383 Other fatigue: Secondary | ICD-10-CM

## 2011-08-13 DIAGNOSIS — M545 Low back pain, unspecified: Secondary | ICD-10-CM

## 2011-08-13 DIAGNOSIS — R5381 Other malaise: Secondary | ICD-10-CM

## 2011-08-13 DIAGNOSIS — R21 Rash and other nonspecific skin eruption: Secondary | ICD-10-CM

## 2011-08-13 MED ORDER — CLOTRIMAZOLE-BETAMETHASONE 1-0.05 % EX CREA: TOPICAL_CREAM | Freq: Two times a day (BID) | CUTANEOUS | Status: AC

## 2011-08-13 MED ORDER — OXYCODONE HCL 15 MG PO TABS: 15.0000 mg | ORAL_TABLET | Freq: Four times a day (QID) | ORAL | Status: DC | PRN

## 2011-08-13 NOTE — Assessment & Plan Note (Signed)
Better Continue with current prescription therapy as reflected on the Med list.  

## 2011-08-13 NOTE — Assessment & Plan Note (Signed)
Monitor at home - call if up

## 2011-08-13 NOTE — Progress Notes (Signed)
  Subjective:    Patient ID: Cody Frazier, male    DOB: 02-Jan-1946, 65 y.o.   MRN: [id number]  HPI   The patient is here to follow up on chronic depression, anxiety, headaches and chronic moderate LBP symptoms controlled with medicines, diet and exercise. F/u hypogonadism - feeling better C/o wt gain C/o rash on chest  Review of Systems  Constitutional: Positive for fatigue (better) and unexpected weight change (wt gain). Negative for appetite change.  HENT: Negative for nosebleeds, congestion, sore throat, sneezing, trouble swallowing and neck pain.   Eyes: Negative for itching and visual disturbance.  Respiratory: Negative for cough.   Cardiovascular: Negative for chest pain, palpitations and leg swelling.  Gastrointestinal: Negative for nausea, diarrhea, blood in stool and abdominal distention.  Genitourinary: Negative for frequency and hematuria.  Musculoskeletal: Positive for back pain. Negative for joint swelling and gait problem.  Skin: Negative for rash.  Neurological: Negative for dizziness, tremors, speech difficulty and weakness.  Psychiatric/Behavioral: Negative for suicidal ideas, sleep disturbance, dysphoric mood and agitation. The patient is nervous/anxious.    Wt Readings from Last 3 Encounters:  08/13/11 185 lb (83.915 kg)  05/25/11 178 lb (80.74 kg)  05/11/11 179 lb (81.194 kg)   BP Readings from Last 3 Encounters:  08/13/11 140/98  05/25/11 130/98  05/11/11 120/90        Objective:   Physical Exam  Constitutional: He is oriented to person, place, and time. He appears well-developed.       Obese   HENT:  Mouth/Throat: Oropharynx is clear and moist.  Eyes: Conjunctivae are normal. Pupils are equal, round, and reactive to light.  Neck: Normal range of motion. No JVD present. No thyromegaly present.  Cardiovascular: Normal rate, regular rhythm, normal heart sounds and intact distal pulses.  Exam reveals no gallop and no friction rub.   No murmur  heard. Pulmonary/Chest: Effort normal and breath sounds normal. No respiratory distress. He has no wheezes. He has no rales. He exhibits no tenderness.  Abdominal: Soft. Bowel sounds are normal. He exhibits no distension and no mass. There is no tenderness. There is no rebound and no guarding.  Musculoskeletal: Normal range of motion. He exhibits tenderness (LS is tender). He exhibits no edema.  Lymphadenopathy:    He has no cervical adenopathy.  Neurological: He is alert and oriented to person, place, and time. He has normal reflexes. No cranial nerve deficit. He exhibits normal muscle tone. Coordination normal.  Skin: Skin is warm and dry. Rash (acne on chet) noted.  Psychiatric: His behavior is normal. Judgment and thought content normal.     BP Readings from Last 3 Encounters:  08/13/11 140/98  05/25/11 130/98  05/11/11 120/90        Assessment & Plan:

## 2011-08-13 NOTE — Assessment & Plan Note (Signed)
Continue with current prescription therapy as reflected on the Med list.  

## 2011-08-13 NOTE — Assessment & Plan Note (Signed)
Better on testosterone 

## 2011-08-14 LAB — CROSSMATCH
ABO/RH(D): O POS
Antibody Screen: NEGATIVE

## 2011-08-14 LAB — BASIC METABOLIC PANEL
BUN: 10
BUN: 23
BUN: 49 — ABNORMAL HIGH
CO2: 22
CO2: 24
CO2: 26
Calcium: 8.3 — ABNORMAL LOW
Calcium: 8.5
Calcium: 9.1
Chloride: 110
Chloride: 116 — ABNORMAL HIGH
Chloride: 121 — ABNORMAL HIGH
Creatinine, Ser: 0.94
Creatinine, Ser: 0.97
Creatinine, Ser: 0.98
GFR calc Af Amer: >60
GFR calc Af Amer: >60
GFR calc Af Amer: >60
GFR calc non Af Amer: >60
GFR calc non Af Amer: >60
GFR calc non Af Amer: >60
Glucose, Bld: 128 — ABNORMAL HIGH
Glucose, Bld: 93
Glucose, Bld: 98
Potassium: 3.3 — ABNORMAL LOW
Potassium: 3.8
Potassium: 5.3 — ABNORMAL HIGH
Sodium: 143
Sodium: 144
Sodium: 147 — ABNORMAL HIGH

## 2011-08-14 LAB — GASTRIN: Gastrin: 137 pg/mL — ABNORMAL HIGH (ref 13–115)

## 2011-08-14 LAB — CBC
HCT: 22 — ABNORMAL LOW
HCT: 28 — ABNORMAL LOW
Hemoglobin: 7.2 — CL
Hemoglobin: 9.4 — ABNORMAL LOW
MCHC: 32.8
MCHC: 33.4
MCV: 90.3
MCV: 92
Platelets: 216
Platelets: 268
RBC: 2.4 — ABNORMAL LOW
RBC: 3.1 — ABNORMAL LOW
RDW: 14.6
RDW: 15
WBC: 5.8
WBC: 6.4

## 2011-08-14 LAB — HEMOGLOBIN AND HEMATOCRIT, BLOOD
HCT: 29.2 — ABNORMAL LOW
HCT: 29.5 — ABNORMAL LOW
HCT: 30.8 — ABNORMAL LOW
HCT: 30.9 — ABNORMAL LOW
HCT: 31 — ABNORMAL LOW
HCT: 33.8 — ABNORMAL LOW
Hemoglobin: 10.2 — ABNORMAL LOW
Hemoglobin: 10.2 — ABNORMAL LOW
Hemoglobin: 10.4 — ABNORMAL LOW
Hemoglobin: 11.5 — ABNORMAL LOW
Hemoglobin: 9.9 — ABNORMAL LOW
Hemoglobin: 9.9 — ABNORMAL LOW

## 2011-08-14 LAB — ABO/RH: ABO/RH(D): O POS

## 2011-08-14 LAB — H. PYLORI ANTIBODY, IGG: H Pylori IgG: <0.4

## 2011-08-28 LAB — I-STAT 8, (EC8 V) (CONVERTED LAB)
Acid-base deficit: 1
BUN: 10
Bicarbonate: 23.8
Chloride: 109
Glucose, Bld: 98
HCT: 35 — ABNORMAL LOW
Hemoglobin: 11.9 — ABNORMAL LOW
Operator id: 268271
Potassium: 3.8
Sodium: 141
TCO2: 25
pCO2, Ven: 38.5 — ABNORMAL LOW
pH, Ven: 7.4 — ABNORMAL HIGH

## 2011-09-08 ENCOUNTER — Other Ambulatory Visit: Payer: Self-pay | Admitting: Internal Medicine

## 2011-09-25 ENCOUNTER — Telehealth: Payer: Self-pay | Admitting: *Deleted

## 2011-09-25 MED ORDER — OXYCODONE HCL 15 MG PO TABS: 15.0000 mg | ORAL_TABLET | Freq: Four times a day (QID) | ORAL | Status: DC | PRN

## 2011-09-25 NOTE — Telephone Encounter (Signed)
Rx printed/pending MD sig. 

## 2011-09-25 NOTE — Telephone Encounter (Signed)
OK to fill this prescription with additional refills x0 Thank you!  

## 2011-09-25 NOTE — Telephone Encounter (Signed)
Pt walked in office requesting Rf on Oxycodone. Please advise.

## 2011-09-25 NOTE — Telephone Encounter (Signed)
Pt informed Rx ready for P/U Mon-Fri 8am-5pm.

## 2011-10-25 ENCOUNTER — Telehealth: Payer: Self-pay | Admitting: *Deleted

## 2011-10-25 NOTE — Telephone Encounter (Signed)
Pt req Rf on oxycodone 15 mg # 120. Please advise.

## 2011-10-25 NOTE — Telephone Encounter (Signed)
OK to fill this prescription with additional refills x0 Pls sch OV or keep ROV Thank you!

## 2011-10-26 MED ORDER — OXYCODONE HCL 15 MG PO TABS: 15.0000 mg | ORAL_TABLET | Freq: Four times a day (QID) | ORAL | Status: DC | PRN

## 2011-10-26 NOTE — Telephone Encounter (Signed)
Rx printed. Pt informed.

## 2011-10-31 ENCOUNTER — Other Ambulatory Visit: Payer: Self-pay | Admitting: Internal Medicine

## 2011-11-19 ENCOUNTER — Encounter: Payer: Self-pay | Admitting: Internal Medicine

## 2011-11-19 ENCOUNTER — Ambulatory Visit (INDEPENDENT_AMBULATORY_CARE_PROVIDER_SITE_OTHER): Payer: BC Managed Care – PPO | Admitting: Internal Medicine

## 2011-11-19 VITALS — BP 140/90 | HR 84 | Temp 99.0°F | Resp 16 | Wt 189.0 lb

## 2011-11-19 DIAGNOSIS — F411 Generalized anxiety disorder: Secondary | ICD-10-CM

## 2011-11-19 DIAGNOSIS — Z23 Encounter for immunization: Secondary | ICD-10-CM

## 2011-11-19 DIAGNOSIS — M545 Low back pain, unspecified: Secondary | ICD-10-CM

## 2011-11-19 DIAGNOSIS — K2270 Barrett's esophagus without dysplasia: Secondary | ICD-10-CM

## 2011-11-19 DIAGNOSIS — K922 Gastrointestinal hemorrhage, unspecified: Secondary | ICD-10-CM

## 2011-11-19 MED ORDER — OXYCODONE HCL 15 MG PO TABS: 15.0000 mg | ORAL_TABLET | Freq: Four times a day (QID) | ORAL | Status: DC | PRN

## 2011-11-19 NOTE — Assessment & Plan Note (Signed)
Continue with current prescription therapy as reflected on the Med list.  

## 2011-11-19 NOTE — Assessment & Plan Note (Signed)
H/o opiate addiction 2010 - on Bupre-nalox 

## 2011-11-19 NOTE — Progress Notes (Signed)
  Subjective:    Patient ID: Cody Frazier, male    DOB: August 19, 1946, 66 y.o.   MRN: [id number]  HPI   The patient is here to follow up on chronic LBP, GERD, controlled with medicines, diet and exercise.   Review of Systems  Constitutional: Positive for fatigue. Negative for appetite change and unexpected weight change.  HENT: Negative for nosebleeds, congestion, sore throat, sneezing, trouble swallowing and neck pain.   Eyes: Negative for itching and visual disturbance.  Respiratory: Negative for cough.   Cardiovascular: Negative for chest pain, palpitations and leg swelling.  Gastrointestinal: Negative for nausea, diarrhea, blood in stool and abdominal distention.  Genitourinary: Negative for frequency and hematuria.  Musculoskeletal: Positive for back pain. Negative for joint swelling and gait problem.  Skin: Negative for rash.  Neurological: Negative for dizziness, tremors, speech difficulty and weakness.  Psychiatric/Behavioral: Positive for sleep disturbance. Negative for suicidal ideas, dysphoric mood and agitation. The patient is not nervous/anxious.        Objective:   Physical Exam  Constitutional: He is oriented to person, place, and time. He appears well-developed.  HENT:  Mouth/Throat: Oropharynx is clear and moist.  Eyes: Conjunctivae are normal. Pupils are equal, round, and reactive to light.  Neck: Normal range of motion. No JVD present. No thyromegaly present.  Cardiovascular: Normal rate, regular rhythm, normal heart sounds and intact distal pulses.  Exam reveals no gallop and no friction rub.   No murmur heard. Pulmonary/Chest: Effort normal and breath sounds normal. No respiratory distress. He has no wheezes. He has no rales. He exhibits no tenderness.  Abdominal: Soft. Bowel sounds are normal. He exhibits no distension and no mass. There is no tenderness. There is no rebound and no guarding.  Musculoskeletal: Normal range of motion. He exhibits tenderness (LS  spine). He exhibits no edema.  Lymphadenopathy:    He has no cervical adenopathy.  Neurological: He is alert and oriented to person, place, and time. He has normal reflexes. No cranial nerve deficit. He exhibits normal muscle tone. Coordination normal.  Skin: Skin is warm and dry. No rash noted.  Psychiatric: He has a normal mood and affect. His behavior is normal. Judgment and thought content normal.          Assessment & Plan:

## 2011-11-19 NOTE — Assessment & Plan Note (Signed)
No relapse 

## 2011-11-27 ENCOUNTER — Telehealth: Payer: Self-pay | Admitting: *Deleted

## 2011-11-27 MED ORDER — TESTOSTERONE 20.25 MG/ACT (1.62%) TD GEL: 2.0000 | TRANSDERMAL | Status: DC

## 2011-11-27 NOTE — Telephone Encounter (Signed)
Rf req for Androgel 1.62% apply 2 pumps to skin qam. Ok to Rf?

## 2011-11-27 NOTE — Telephone Encounter (Signed)
OK to fill this prescription with additional refills x5 Thank you!  

## 2011-12-26 ENCOUNTER — Other Ambulatory Visit: Payer: Self-pay | Admitting: *Deleted

## 2011-12-26 MED ORDER — BUSPIRONE HCL 7.5 MG PO TABS: ORAL_TABLET | ORAL | Status: DC

## 2012-01-17 ENCOUNTER — Other Ambulatory Visit (INDEPENDENT_AMBULATORY_CARE_PROVIDER_SITE_OTHER): Payer: BC Managed Care – PPO

## 2012-01-17 DIAGNOSIS — E291 Testicular hypofunction: Secondary | ICD-10-CM

## 2012-01-17 DIAGNOSIS — R5381 Other malaise: Secondary | ICD-10-CM

## 2012-01-17 LAB — COMPREHENSIVE METABOLIC PANEL
ALT: 22 U/L (ref 0–53)
AST: 21 U/L (ref 0–37)
Albumin: 4.1 g/dL (ref 3.5–5.2)
Alkaline Phosphatase: 99 U/L (ref 39–117)
BUN: 19 mg/dL (ref 6–23)
CO2: 30 mEq/L (ref 19–32)
Calcium: 10.1 mg/dL (ref 8.4–10.5)
Chloride: 102 mEq/L (ref 96–112)
Creatinine, Ser: 1.1 mg/dL (ref 0.4–1.5)
GFR: 69.64 mL/min (ref >60.00)
Glucose, Bld: 89 mg/dL (ref 70–99)
Potassium: 5.5 mEq/L — ABNORMAL HIGH (ref 3.5–5.1)
Sodium: 139 mEq/L (ref 135–145)
Total Bilirubin: 0.3 mg/dL (ref 0.3–1.2)
Total Protein: 6.8 g/dL (ref 6.0–8.3)

## 2012-01-17 LAB — PSA: PSA: 0.24 ng/mL (ref 0.10–4.00)

## 2012-01-17 LAB — TESTOSTERONE: Testosterone: 338.06 ng/dL — ABNORMAL LOW (ref 350.00–890.00)

## 2012-01-23 ENCOUNTER — Encounter: Payer: Self-pay | Admitting: Internal Medicine

## 2012-01-23 ENCOUNTER — Ambulatory Visit (INDEPENDENT_AMBULATORY_CARE_PROVIDER_SITE_OTHER): Payer: BC Managed Care – PPO | Admitting: Internal Medicine

## 2012-01-23 VITALS — BP 160/100 | HR 84 | Temp 98.1°F | Resp 16 | Wt 191.0 lb

## 2012-01-23 DIAGNOSIS — F411 Generalized anxiety disorder: Secondary | ICD-10-CM

## 2012-01-23 DIAGNOSIS — R5383 Other fatigue: Secondary | ICD-10-CM

## 2012-01-23 DIAGNOSIS — M545 Low back pain, unspecified: Secondary | ICD-10-CM

## 2012-01-23 DIAGNOSIS — E291 Testicular hypofunction: Secondary | ICD-10-CM

## 2012-01-23 DIAGNOSIS — E785 Hyperlipidemia, unspecified: Secondary | ICD-10-CM

## 2012-01-23 DIAGNOSIS — I10 Essential (primary) hypertension: Secondary | ICD-10-CM

## 2012-01-23 DIAGNOSIS — R5381 Other malaise: Secondary | ICD-10-CM

## 2012-01-23 MED ORDER — METOCLOPRAMIDE HCL 5 MG PO TABS: 5.0000 mg | ORAL_TABLET | Freq: Three times a day (TID) | ORAL | Status: DC

## 2012-01-23 MED ORDER — LOSARTAN POTASSIUM 25 MG PO TABS: 25.0000 mg | ORAL_TABLET | Freq: Two times a day (BID) | ORAL | Status: DC

## 2012-01-23 MED ORDER — OXYCODONE HCL 15 MG PO TABS: 15.0000 mg | ORAL_TABLET | Freq: Four times a day (QID) | ORAL | Status: DC | PRN

## 2012-01-23 NOTE — Assessment & Plan Note (Signed)
See med increase

## 2012-01-23 NOTE — Assessment & Plan Note (Signed)
Continue with current prescription therapy as reflected on the Med list.  

## 2012-01-23 NOTE — Progress Notes (Signed)
Patient ID: Cody Frazier, male   DOB: November 21, 1945, 66 y.o.   MRN: [id number]  Subjective:    Patient ID: Cody Frazier, male    DOB: 1946-10-02, 66 y.o.   MRN: [id number]  HPI   The patient is here to follow up on chronic LBP, GERD, hypogonadism controlled with medicines, diet and exercise.  Wt Readings from Last 3 Encounters:  01/23/12 191 lb (86.637 kg)  11/19/11 189 lb (85.73 kg)  08/13/11 185 lb (83.915 kg)   BP Readings from Last 3 Encounters:  01/23/12 160/100  11/19/11 140/90  08/13/11 140/98       Review of Systems  Constitutional: Positive for fatigue. Negative for appetite change and unexpected weight change.  HENT: Negative for nosebleeds, congestion, sore throat, sneezing, trouble swallowing and neck pain.   Eyes: Negative for itching and visual disturbance.  Respiratory: Negative for cough.   Cardiovascular: Negative for chest pain, palpitations and leg swelling.  Gastrointestinal: Negative for nausea, diarrhea, blood in stool and abdominal distention.  Genitourinary: Negative for frequency and hematuria.  Musculoskeletal: Positive for back pain. Negative for joint swelling and gait problem.  Skin: Negative for rash.  Neurological: Negative for dizziness, tremors, speech difficulty and weakness.  Psychiatric/Behavioral: Positive for sleep disturbance. Negative for suicidal ideas, dysphoric mood and agitation. The patient is not nervous/anxious.        Objective:   Physical Exam  Constitutional: He is oriented to person, place, and time. He appears well-developed.  HENT:  Mouth/Throat: Oropharynx is clear and moist.  Eyes: Conjunctivae are normal. Pupils are equal, round, and reactive to light.  Neck: Normal range of motion. No JVD present. No thyromegaly present.  Cardiovascular: Normal rate, regular rhythm, normal heart sounds and intact distal pulses.  Exam reveals no gallop and no friction rub.   No murmur heard. Pulmonary/Chest: Effort normal and  breath sounds normal. No respiratory distress. He has no wheezes. He has no rales. He exhibits no tenderness.  Abdominal: Soft. Bowel sounds are normal. He exhibits no distension and no mass. There is no tenderness. There is no rebound and no guarding.  Musculoskeletal: Normal range of motion. He exhibits tenderness (LS spine). He exhibits no edema.  Lymphadenopathy:    He has no cervical adenopathy.  Neurological: He is alert and oriented to person, place, and time. He has normal reflexes. No cranial nerve deficit. He exhibits normal muscle tone. Coordination normal.  Skin: Skin is warm and dry. No rash noted.  Psychiatric: He has a normal mood and affect. His behavior is normal. Judgment and thought content normal.    Lab Results  Component Value Date   WBC 4.9 08/08/2011   HGB 14.5 08/08/2011   HCT 43.5 08/08/2011   PLT 301.0 08/08/2011   GLUCOSE 89 01/17/2012   CHOL 275* 05/03/2011   TRIG 167.0* 05/03/2011   HDL 55.00 05/03/2011   LDLDIRECT 163.3 05/03/2011   LDLCALC  Value: 133        Total Cholesterol/HDL:CHD Risk Coronary Heart Disease Risk Table                     Men   Women  1/2 Average Risk   3.4   3.3  Average Risk       5.0   4.4  2 X Average Risk   9.6   7.1  3 X Average Risk  23.4   11.0        Use the calculated Patient Ratio above and  the CHD Risk Table to determine the patient's CHD Risk.        ATP III CLASSIFICATION (LDL):  <100     mg/dL   Optimal  409-811  mg/dL   Near or Above                    Optimal  130-159  mg/dL   Borderline  914-782  mg/dL   High  >956     mg/dL   Very High* 12/26/863   ALT 22 01/17/2012   AST 21 01/17/2012   NA 139 01/17/2012   K 5.5* 01/17/2012   CL 102 01/17/2012   CREATININE 1.1 01/17/2012   BUN 19 01/17/2012   CO2 30 01/17/2012   TSH 0.59 02/01/2011   PSA 0.24 01/17/2012   INR 1.24 04/17/2010   HGBA1C 5.5 04/30/2008         Assessment & Plan:

## 2012-01-23 NOTE — Assessment & Plan Note (Signed)
Better  

## 2012-01-28 ENCOUNTER — Encounter: Payer: Self-pay | Admitting: Gastroenterology

## 2012-02-19 ENCOUNTER — Encounter: Payer: Self-pay | Admitting: Gastroenterology

## 2012-03-04 ENCOUNTER — Other Ambulatory Visit: Payer: Self-pay | Admitting: Internal Medicine

## 2012-03-12 DIAGNOSIS — D126 Benign neoplasm of colon, unspecified: Secondary | ICD-10-CM

## 2012-03-12 HISTORY — DX: Benign neoplasm of colon, unspecified: D12.6

## 2012-03-17 ENCOUNTER — Encounter: Payer: Self-pay | Admitting: Gastroenterology

## 2012-03-17 ENCOUNTER — Ambulatory Visit (AMBULATORY_SURGERY_CENTER): Payer: BC Managed Care – PPO | Admitting: *Deleted

## 2012-03-17 VITALS — Ht 70.0 in | Wt 196.9 lb

## 2012-03-17 DIAGNOSIS — Z1211 Encounter for screening for malignant neoplasm of colon: Secondary | ICD-10-CM

## 2012-03-17 DIAGNOSIS — K2270 Barrett's esophagus without dysplasia: Secondary | ICD-10-CM

## 2012-03-17 DIAGNOSIS — Z800 Family history of malignant neoplasm of digestive organs: Secondary | ICD-10-CM

## 2012-03-17 MED ORDER — PEG-KCL-NACL-NASULF-NA ASC-C 100 G PO SOLR: ORAL | Status: DC

## 2012-04-02 ENCOUNTER — Ambulatory Visit (AMBULATORY_SURGERY_CENTER): Payer: BC Managed Care – PPO | Admitting: Gastroenterology

## 2012-04-02 ENCOUNTER — Encounter: Payer: Self-pay | Admitting: Gastroenterology

## 2012-04-02 VITALS — BP 174/90 | HR 67 | Temp 98.1°F | Resp 18 | Ht 70.0 in | Wt 196.0 lb

## 2012-04-02 DIAGNOSIS — K2270 Barrett's esophagus without dysplasia: Secondary | ICD-10-CM

## 2012-04-02 DIAGNOSIS — K2990 Gastroduodenitis, unspecified, without bleeding: Secondary | ICD-10-CM

## 2012-04-02 DIAGNOSIS — D126 Benign neoplasm of colon, unspecified: Secondary | ICD-10-CM

## 2012-04-02 DIAGNOSIS — Z800 Family history of malignant neoplasm of digestive organs: Secondary | ICD-10-CM

## 2012-04-02 DIAGNOSIS — K2970 Gastritis, unspecified, without bleeding: Secondary | ICD-10-CM

## 2012-04-02 DIAGNOSIS — Z1211 Encounter for screening for malignant neoplasm of colon: Secondary | ICD-10-CM

## 2012-04-02 MED ORDER — SODIUM CHLORIDE 0.9 % IV SOLN: 500.0000 mL | INTRAVENOUS | Status: DC

## 2012-04-02 NOTE — Progress Notes (Signed)
Patient did not experience any of the following events: a burn prior to discharge; a fall within the facility; wrong site/side/patient/procedure/implant event; or a hospital transfer or hospital admission upon discharge from the facility. (G8907) Patient did not have preoperative order for IV antibiotic SSI prophylaxis. (G8918)  

## 2012-04-02 NOTE — Op Note (Signed)
[facility] [address] [place], [place]  [address]  ENDOSCOPY PROCEDURE REPORT PATIENT:  Cody Frazier, Cody Frazier  MR#:  [id number] BIRTHDATE:  1946/08/09, 65 yrs. old  GENDER:  male ENDOSCOPIST:  Judie Petit T. Russella Dar, MD, [facility]  PROCEDURE DATE:  04/02/2012 PROCEDURE:  EGD with biopsy, 19147 ASA CLASS:  Class II INDICATIONS:  Barrett's Esophagus MEDICATIONS:  MAC sedation, administered by CRNA, There was residual sedation effect present from prior procedure., propofol (Diprivan) 200 mg IV TOPICAL ANESTHETIC:  none DESCRIPTION OF PROCEDURE:   After the risks benefits and alternatives of the procedure were thoroughly explained, informed consent was obtained.  The LB GIF-H180 [id number] endoscope was introduced through the mouth and advanced to the second portion of the duodenum, without limitations.  The instrument was slowly withdrawn as the mucosa was fully examined. <<PROCEDUREIMAGES>> Barrett's esophagus was found. It was 3 cm in length. Four quadrant biopsies were taken every 1 cm throughout the length of the barretts.  Otherwise normal esophagus.  A nodule was found in the fundus. It was 3 mm in size. With standard forceps, a biopsy was obtained and sent to pathology.  Moderate gastritis was found in the total stomach. Multiple biopsies were obtained and sent to pathology. Bilroth I anastomosis with marked erythema at anastomsis, biopsied. Otherwise normal stomach.  The duodenal bulb was normal in appearance, as was the postbulbar duodenum. Retroflexed views revealed a hiatal hernia, small.  The scope was then withdrawn from the patient and the procedure completed.  COMPLICATIONS:  None  ENDOSCOPIC IMPRESSION: 1) 3 cm barrett's esophagus 2) Small hiatal hernia 3) 3 mm nodule in the fundus 4) Moderate gastritis  RECOMMENDATIONS: 1) Await pathology results 2) Continue PPI 3) Endoscopy in 3 years if no dysplasia  Jasneet Schobert T. Russella Dar, MD, [facility]  n. eSIGNED:   Venita Lick.  Berklee Battey at 04/02/2012 10:51 AM  [facility], [phone]

## 2012-04-02 NOTE — Patient Instructions (Signed)

## 2012-04-02 NOTE — Op Note (Signed)
[facility] [address] [place], [place]  [address]  COLONOSCOPY PROCEDURE REPORT PATIENT:  Cody Frazier, Cody Frazier  MR#:  [id number] BIRTHDATE:  Sep 19, 1946, 65 yrs. old  GENDER:  male ENDOSCOPIST:  Judie Petit T. Russella Dar, MD, [facility]  PROCEDURE DATE:  04/02/2012 PROCEDURE:  Colonoscopy with snare polypectomy ASA CLASS:  Class II INDICATIONS:  1) Elevated Risk Screening  2) family history of colon cancer, mother, father, aunt, uncle MEDICATIONS:   MAC sedation, administered by CRNA, propofol (Diprivan) 300 mg IV DESCRIPTION OF PROCEDURE:   After the risks benefits and alternatives of the procedure were thoroughly explained, informed consent was obtained.  Digital rectal exam was performed and revealed no abnormalities.   The LB CF-H180AL [id number] endoscope was introduced through the anus and advanced to the cecum, which was identified by both the appendix and ileocecal valve, without limitations.  The quality of the prep was good, using MoviPrep. The instrument was then slowly withdrawn as the colon was fully examined. <<PROCEDUREIMAGES>>  FINDINGS:  Three polyps were found in the ascending colon. They were 4 - 7 mm in size. Polyps were snared without cautery. Retrieval was successful. Two polyps were found in the transverse colon. They were 5 - 6 mm in size. Polyps were snared without cautery. Retrieval was successful.  Two polyps were found in the sigmoid colon. They were 4 - 6 mm in size. Polyps were snared without cautery. Retrieval was successful. Scattered diverticula were found in the ascending colon. Moderate diverticulosis was found in the sigmoid to descending colon.  Otherwise normal colonoscopy without other polyps, masses, vascular ectasias, or inflammatory changes. Retroflexed views in the rectum revealed internal hemorrhoids, moderate.  The time to cecum =  3.25 minutes. The scope was then withdrawn (time =  20.75  min) from the patient and the procedure  completed.  COMPLICATIONS:  None  ENDOSCOPIC IMPRESSION: 1) 4 - 7 mm Three polyps in the ascending colon 2) 5 - 6 mm Two polyps in the transverse colon 3) 4 - 6 mm Two polyps in the sigmoid colon 4) Diverticula, scattered in the ascending colon 5) Moderate diverticulosis in the sigmoid to descending colon 6) Internal hemorrhoids  RECOMMENDATIONS: 1) Await pathology results 2) High fiber diet with liberal fluid intake. 3) Repeat Colonscopy in 2-5 years pending pathology review.  Venita Lick. Russella Dar, MD, [facility]  n. eSIGNED:   Venita Lick. Josalynn Johndrow at 04/02/2012 10:33 AM  [facility], [phone]

## 2012-04-03 ENCOUNTER — Telehealth: Payer: Self-pay | Admitting: *Deleted

## 2012-04-03 NOTE — Telephone Encounter (Signed)
  Follow up Call-  Call back number 04/02/2012  Post procedure Call Back phone  # [phone]  Permission to leave phone message Yes     Patient questions:  Do you have a fever, pain , or abdominal swelling? no Pain Score  0 *  Have you tolerated food without any problems? yes  Have you been able to return to your normal activities? yes  Do you have any questions about your discharge instructions: Diet   no Medications  no Follow up visit  no  Do you have questions or concerns about your Care? no  Actions: * If pain score is 4 or above: No action needed, pain <4.

## 2012-04-08 ENCOUNTER — Encounter: Payer: Self-pay | Admitting: Gastroenterology

## 2012-04-21 ENCOUNTER — Other Ambulatory Visit (INDEPENDENT_AMBULATORY_CARE_PROVIDER_SITE_OTHER): Payer: BC Managed Care – PPO

## 2012-04-21 ENCOUNTER — Other Ambulatory Visit: Payer: Self-pay | Admitting: *Deleted

## 2012-04-21 DIAGNOSIS — Z0000 Encounter for general adult medical examination without abnormal findings: Secondary | ICD-10-CM

## 2012-04-21 DIAGNOSIS — Z0389 Encounter for observation for other suspected diseases and conditions ruled out: Secondary | ICD-10-CM

## 2012-04-21 LAB — CBC WITH DIFFERENTIAL/PLATELET
Basophils Absolute: 0 10*3/uL (ref 0.0–0.1)
Basophils Relative: 0.2 % (ref 0.0–3.0)
Eosinophils Absolute: 0.1 10*3/uL (ref 0.0–0.7)
Eosinophils Relative: 0.9 % (ref 0.0–5.0)
HCT: 46.8 % (ref 39.0–52.0)
Hemoglobin: 15.1 g/dL (ref 13.0–17.0)
Lymphocytes Relative: 24.2 % (ref 12.0–46.0)
Lymphs Abs: 1.4 10*3/uL (ref 0.7–4.0)
MCHC: 32.3 g/dL (ref 30.0–36.0)
MCV: 91.8 fl (ref 78.0–100.0)
Monocytes Absolute: 0.5 10*3/uL (ref 0.1–1.0)
Monocytes Relative: 9.1 % (ref 3.0–12.0)
Neutro Abs: 3.8 10*3/uL (ref 1.4–7.7)
Neutrophils Relative %: 65.6 % (ref 43.0–77.0)
Platelets: 279 10*3/uL (ref 150.0–400.0)
RBC: 5.1 Mil/uL (ref 4.22–5.81)
RDW: 13.9 % (ref 11.5–14.6)
WBC: 5.7 10*3/uL (ref 4.5–10.5)

## 2012-04-21 LAB — URINALYSIS, ROUTINE W REFLEX MICROSCOPIC
Bilirubin Urine: NEGATIVE
Ketones, ur: NEGATIVE
Leukocytes, UA: NEGATIVE
Nitrite: NEGATIVE
Specific Gravity, Urine: 1.015 (ref 1.000–1.030)
Total Protein, Urine: NEGATIVE
Urine Glucose: NEGATIVE
Urobilinogen, UA: 0.2 (ref 0.0–1.0)
pH: 6 (ref 5.0–8.0)

## 2012-04-21 LAB — LIPID PANEL
Cholesterol: 225 mg/dL — ABNORMAL HIGH (ref 0–200)
HDL: 44.3 mg/dL (ref >39.00)
Total CHOL/HDL Ratio: 5
Triglycerides: 156 mg/dL — ABNORMAL HIGH (ref 0.0–149.0)
VLDL: 31.2 mg/dL (ref 0.0–40.0)

## 2012-04-21 LAB — HEPATIC FUNCTION PANEL
ALT: 24 U/L (ref 0–53)
AST: 24 U/L (ref 0–37)
Albumin: 4 g/dL (ref 3.5–5.2)
Alkaline Phosphatase: 101 U/L (ref 39–117)
Bilirubin, Direct: 0.1 mg/dL (ref 0.0–0.3)
Total Bilirubin: 0.6 mg/dL (ref 0.3–1.2)
Total Protein: 6.8 g/dL (ref 6.0–8.3)

## 2012-04-21 LAB — BASIC METABOLIC PANEL
BUN: 18 mg/dL (ref 6–23)
CO2: 27 mEq/L (ref 19–32)
Calcium: 9.7 mg/dL (ref 8.4–10.5)
Chloride: 103 mEq/L (ref 96–112)
Creatinine, Ser: 1.2 mg/dL (ref 0.4–1.5)
GFR: 65.52 mL/min (ref >60.00)
Glucose, Bld: 87 mg/dL (ref 70–99)
Potassium: 5.4 mEq/L — ABNORMAL HIGH (ref 3.5–5.1)
Sodium: 141 mEq/L (ref 135–145)

## 2012-04-21 LAB — TSH: TSH: 0.48 u[IU]/mL (ref 0.35–5.50)

## 2012-04-21 LAB — LDL CHOLESTEROL, DIRECT: Direct LDL: 154.1 mg/dL

## 2012-04-21 LAB — PSA: PSA: 0.31 ng/mL (ref 0.10–4.00)

## 2012-04-24 ENCOUNTER — Encounter: Payer: Self-pay | Admitting: Internal Medicine

## 2012-04-24 ENCOUNTER — Ambulatory Visit (INDEPENDENT_AMBULATORY_CARE_PROVIDER_SITE_OTHER): Payer: BC Managed Care – PPO | Admitting: Internal Medicine

## 2012-04-24 VITALS — BP 130/90 | HR 76 | Temp 98.5°F | Resp 16 | Wt 196.0 lb

## 2012-04-24 DIAGNOSIS — H6120 Impacted cerumen, unspecified ear: Secondary | ICD-10-CM

## 2012-04-24 DIAGNOSIS — Z136 Encounter for screening for cardiovascular disorders: Secondary | ICD-10-CM

## 2012-04-24 DIAGNOSIS — Z0000 Encounter for general adult medical examination without abnormal findings: Secondary | ICD-10-CM

## 2012-04-24 MED ORDER — OXYCODONE HCL 15 MG PO TABS: 15.0000 mg | ORAL_TABLET | Freq: Four times a day (QID) | ORAL | Status: DC | PRN

## 2012-04-24 MED ORDER — SCOPOLAMINE 1 MG/3DAYS TD PT72: 1.0000 | MEDICATED_PATCH | TRANSDERMAL | Status: DC

## 2012-04-24 NOTE — Assessment & Plan Note (Signed)
See procedure 

## 2012-04-24 NOTE — Assessment & Plan Note (Signed)
We discussed age appropriate health related issues, including available/recomended screening tests and vaccinations. We discussed a need for adhering to healthy diet and exercise. Labs/EKG were reviewed/ordered. All questions were answered.   

## 2012-04-24 NOTE — Progress Notes (Signed)
Subjective:    Patient ID: Cody Frazier, male    DOB: 11/13/1945, 66 y.o.   MRN: [id number]  HPI  The patient is here for a wellness exam. The patient has been doing well overall without major physical or psychological issues going on lately.  The patient is here to follow up on chronic LBP, GERD, hypogonadism controlled with medicines, diet and exercise.  Wt Readings from Last 3 Encounters:  04/24/12 196 lb (88.905 kg)  04/02/12 196 lb (88.905 kg)  03/17/12 196 lb 14.4 oz (89.313 kg)   BP Readings from Last 3 Encounters:  04/24/12 130/90  04/02/12 174/90  01/23/12 160/100       Review of Systems  Constitutional: Positive for fatigue. Negative for appetite change and unexpected weight change.  HENT: Negative for nosebleeds, congestion, sore throat, sneezing, trouble swallowing and neck pain.   Eyes: Negative for itching and visual disturbance.  Respiratory: Negative for cough.   Cardiovascular: Negative for chest pain, palpitations and leg swelling.  Gastrointestinal: Negative for nausea, diarrhea, blood in stool and abdominal distention.  Genitourinary: Negative for frequency and hematuria.  Musculoskeletal: Positive for back pain. Negative for joint swelling and gait problem.  Skin: Negative for rash.  Neurological: Negative for dizziness, tremors, speech difficulty and weakness.  Psychiatric/Behavioral: Positive for disturbed wake/sleep cycle. Negative for suicidal ideas, dysphoric mood and agitation. The patient is not nervous/anxious.        Objective:   Physical Exam  Constitutional: He is oriented to person, place, and time. He appears well-developed.  HENT:  Right Ear: External ear normal.  Left Ear: External ear normal.  Mouth/Throat: Oropharynx is clear and moist.       B wax  Eyes: Conjunctivae are normal. Pupils are equal, round, and reactive to light.  Neck: Normal range of motion. No JVD present. No thyromegaly present.  Cardiovascular: Normal  rate, regular rhythm, normal heart sounds and intact distal pulses.  Exam reveals no gallop and no friction rub.   No murmur heard. Pulmonary/Chest: Effort normal and breath sounds normal. No respiratory distress. He has no wheezes. He has no rales. He exhibits no tenderness.  Abdominal: Soft. Bowel sounds are normal. He exhibits no distension and no mass. There is no tenderness. There is no rebound and no guarding.  Genitourinary:       Per Dr Russella Dar 3 wks ago  Musculoskeletal: Normal range of motion. He exhibits tenderness (LS spine). He exhibits no edema.  Lymphadenopathy:    He has no cervical adenopathy.  Neurological: He is alert and oriented to person, place, and time. He has normal reflexes. No cranial nerve deficit. He exhibits normal muscle tone. Coordination normal.  Skin: Skin is warm and dry. No rash noted.  Psychiatric: He has a normal mood and affect. His behavior is normal. Judgment and thought content normal.    Lab Results  Component Value Date   WBC 5.7 04/21/2012   HGB 15.1 04/21/2012   HCT 46.8 04/21/2012   PLT 279.0 04/21/2012   GLUCOSE 87 04/21/2012   CHOL 225* 04/21/2012   TRIG 156.0* 04/21/2012   HDL 44.30 04/21/2012   LDLDIRECT 154.1 04/21/2012   LDLCALC  Value: 133        Total Cholesterol/HDL:CHD Risk Coronary Heart Disease Risk Table                     Men   Women  1/2 Average Risk   3.4   3.3  Average Risk  5.0   4.4  2 X Average Risk   9.6   7.1  3 X Average Risk  23.4   11.0        Use the calculated Patient Ratio above and the CHD Risk Table to determine the patient's CHD Risk.        ATP III CLASSIFICATION (LDL):  <100     mg/dL   Optimal  161-096  mg/dL   Near or Above                    Optimal  130-159  mg/dL   Borderline  045-409  mg/dL   High  >811     mg/dL   Very High* 07/26/7828   ALT 24 04/21/2012   AST 24 04/21/2012   NA 141 04/21/2012   K 5.4* 04/21/2012   CL 103 04/21/2012   CREATININE 1.2 04/21/2012   BUN 18 04/21/2012   CO2 27 04/21/2012   TSH 0.48  04/21/2012   PSA 0.31 04/21/2012   INR 1.24 04/17/2010   HGBA1C 5.5 04/30/2008    Procedure Note :     Procedure :  Ear irrigation   Indication:  Cerumen impaction B   Risks, including pain, dizziness, eardrum perforation, bleeding, infection and others as well as benefits were explained to the patient in detail. Verbal consent was obtained and the patient agreed to proceed.    We used "The [employer] Device" field with lukewarm water for irrigation. A large amount wax was recovered. Procedure has also required manual wax removal with an ear loop.   Tolerated well. Complications: None.   Postprocedure instructions :  Call if problems.        Assessment & Plan:

## 2012-05-05 ENCOUNTER — Other Ambulatory Visit: Payer: Self-pay | Admitting: Internal Medicine

## 2012-06-13 ENCOUNTER — Telehealth: Payer: Self-pay | Admitting: *Deleted

## 2012-06-13 MED ORDER — TESTOSTERONE 20.25 MG/ACT (1.62%) TD GEL: 2.0000 | TRANSDERMAL | Status: DC

## 2012-06-13 NOTE — Telephone Encounter (Signed)
Rf req for Androgel 1.62 % gel. Apply 2 pumps qam. Ok to Rf?

## 2012-06-13 NOTE — Telephone Encounter (Signed)
Done

## 2012-06-13 NOTE — Telephone Encounter (Signed)
OK to fill this prescription with additional refills x5 Thank you!  

## 2012-06-24 ENCOUNTER — Telehealth: Payer: Self-pay | Admitting: Internal Medicine

## 2012-06-24 NOTE — Telephone Encounter (Signed)
Pt needs refill on Oxycodone 15mg . Pt states he will be out of his meds on the 16th.

## 2012-06-24 NOTE — Telephone Encounter (Signed)
OK to fill this prescription with additional refills x0 Thank you!  

## 2012-06-25 MED ORDER — OXYCODONE HCL 15 MG PO TABS: 15.0000 mg | ORAL_TABLET | Freq: Four times a day (QID) | ORAL | Status: DC | PRN

## 2012-06-25 NOTE — Telephone Encounter (Signed)
Rx printed, awaiting MD signature.  

## 2012-06-26 NOTE — Telephone Encounter (Signed)
Rx placed upfront in cabinet, pt informed.  

## 2012-06-26 NOTE — Telephone Encounter (Signed)
Pt called again requesting refill of pain medication, please advise.

## 2012-07-08 ENCOUNTER — Other Ambulatory Visit: Payer: Self-pay | Admitting: Internal Medicine

## 2012-07-28 ENCOUNTER — Telehealth: Payer: Self-pay | Admitting: Internal Medicine

## 2012-07-28 MED ORDER — OXYCODONE HCL 15 MG PO TABS: 15.0000 mg | ORAL_TABLET | Freq: Four times a day (QID) | ORAL | Status: DC | PRN

## 2012-07-28 NOTE — Telephone Encounter (Signed)
Rx printed/pending MD sig. Pt informed to p/u.

## 2012-07-28 NOTE — Telephone Encounter (Signed)
OK to fill this prescription with additional refills x0 ROV Thank you!  

## 2012-07-28 NOTE — Telephone Encounter (Signed)
Pt req refill for Oxycodone 15 mg quanitity of 120 and this is for back pain. Last refill was 06/25/12. Please call pt once its done.

## 2012-08-12 ENCOUNTER — Telehealth: Payer: Self-pay | Admitting: *Deleted

## 2012-08-12 DIAGNOSIS — I10 Essential (primary) hypertension: Secondary | ICD-10-CM

## 2012-08-12 DIAGNOSIS — D509 Iron deficiency anemia, unspecified: Secondary | ICD-10-CM

## 2012-08-12 NOTE — Telephone Encounter (Signed)
Pt has OV  Next week. He called to see if he needs labs done prior. Please advise.

## 2012-08-12 NOTE — Telephone Encounter (Signed)
CBC, CMET, A1c

## 2012-08-13 NOTE — Telephone Encounter (Signed)
Done. Pt informed. Dr. Posey Rea- please close encounter.

## 2012-08-14 ENCOUNTER — Other Ambulatory Visit (INDEPENDENT_AMBULATORY_CARE_PROVIDER_SITE_OTHER): Payer: BC Managed Care – PPO

## 2012-08-14 DIAGNOSIS — D509 Iron deficiency anemia, unspecified: Secondary | ICD-10-CM

## 2012-08-14 DIAGNOSIS — I10 Essential (primary) hypertension: Secondary | ICD-10-CM

## 2012-08-14 LAB — CBC WITH DIFFERENTIAL/PLATELET
Basophils Absolute: 0 10*3/uL (ref 0.0–0.1)
Basophils Relative: 0.3 % (ref 0.0–3.0)
Eosinophils Absolute: 0.1 10*3/uL (ref 0.0–0.7)
Eosinophils Relative: 1.5 % (ref 0.0–5.0)
HCT: 48.5 % (ref 39.0–52.0)
Hemoglobin: 16 g/dL (ref 13.0–17.0)
Lymphocytes Relative: 24.6 % (ref 12.0–46.0)
Lymphs Abs: 1.4 10*3/uL (ref 0.7–4.0)
MCHC: 33.1 g/dL (ref 30.0–36.0)
MCV: 91.2 fl (ref 78.0–100.0)
Monocytes Absolute: 0.5 10*3/uL (ref 0.1–1.0)
Monocytes Relative: 8.8 % (ref 3.0–12.0)
Neutro Abs: 3.8 10*3/uL (ref 1.4–7.7)
Neutrophils Relative %: 64.8 % (ref 43.0–77.0)
Platelets: 278 10*3/uL (ref 150.0–400.0)
RBC: 5.32 Mil/uL (ref 4.22–5.81)
RDW: 14.2 % (ref 11.5–14.6)
WBC: 5.9 10*3/uL (ref 4.5–10.5)

## 2012-08-14 LAB — COMPREHENSIVE METABOLIC PANEL
ALT: 21 U/L (ref 0–53)
AST: 21 U/L (ref 0–37)
Albumin: 4 g/dL (ref 3.5–5.2)
Alkaline Phosphatase: 84 U/L (ref 39–117)
BUN: 15 mg/dL (ref 6–23)
CO2: 25 mEq/L (ref 19–32)
Calcium: 9.7 mg/dL (ref 8.4–10.5)
Chloride: 105 mEq/L (ref 96–112)
Creatinine, Ser: 1.1 mg/dL (ref 0.4–1.5)
GFR: 69.52 mL/min (ref >60.00)
Glucose, Bld: 98 mg/dL (ref 70–99)
Potassium: 4.7 mEq/L (ref 3.5–5.1)
Sodium: 139 mEq/L (ref 135–145)
Total Bilirubin: 1.1 mg/dL (ref 0.3–1.2)
Total Protein: 7.1 g/dL (ref 6.0–8.3)

## 2012-08-14 LAB — HEMOGLOBIN A1C: Hgb A1c MFr Bld: 5.5 % (ref 4.6–6.5)

## 2012-08-19 ENCOUNTER — Ambulatory Visit (INDEPENDENT_AMBULATORY_CARE_PROVIDER_SITE_OTHER): Payer: BC Managed Care – PPO | Admitting: Internal Medicine

## 2012-08-19 ENCOUNTER — Encounter: Payer: Self-pay | Admitting: Internal Medicine

## 2012-08-19 VITALS — BP 130/90 | HR 72 | Temp 98.3°F | Resp 16 | Wt 196.0 lb

## 2012-08-19 DIAGNOSIS — L570 Actinic keratosis: Secondary | ICD-10-CM

## 2012-08-19 DIAGNOSIS — Z23 Encounter for immunization: Secondary | ICD-10-CM

## 2012-08-19 DIAGNOSIS — M545 Low back pain, unspecified: Secondary | ICD-10-CM

## 2012-08-19 DIAGNOSIS — K2270 Barrett's esophagus without dysplasia: Secondary | ICD-10-CM

## 2012-08-19 DIAGNOSIS — N200 Calculus of kidney: Secondary | ICD-10-CM

## 2012-08-19 DIAGNOSIS — E291 Testicular hypofunction: Secondary | ICD-10-CM

## 2012-08-19 DIAGNOSIS — Z87442 Personal history of urinary calculi: Secondary | ICD-10-CM | POA: Insufficient documentation

## 2012-08-19 DIAGNOSIS — I10 Essential (primary) hypertension: Secondary | ICD-10-CM

## 2012-08-19 MED ORDER — OXYCODONE HCL 15 MG PO TABS: 15.0000 mg | ORAL_TABLET | Freq: Four times a day (QID) | ORAL | Status: DC | PRN

## 2012-08-19 NOTE — Progress Notes (Signed)
Subjective:    Patient ID: Cody Frazier, male    DOB: September 07, 1946, 66 y.o.   MRN: [id number]  HPI   The patient is here to follow up on chronic LBP, GERD, hypogonadism controlled with medicines, diet and exercise. He passed a kidney stone - very painful - L side  Wt Readings from Last 3 Encounters:  08/19/12 196 lb (88.905 kg)  04/24/12 196 lb (88.905 kg)  04/02/12 196 lb (88.905 kg)   BP Readings from Last 3 Encounters:  08/19/12 130/90  04/24/12 130/90  04/02/12 174/90       Review of Systems  Constitutional: Positive for fatigue. Negative for appetite change and unexpected weight change.  HENT: Negative for nosebleeds, congestion, sore throat, sneezing, trouble swallowing and neck pain.   Eyes: Negative for itching and visual disturbance.  Respiratory: Negative for cough.   Cardiovascular: Negative for chest pain, palpitations and leg swelling.  Gastrointestinal: Negative for nausea, diarrhea, blood in stool and abdominal distention.  Genitourinary: Negative for frequency and hematuria.  Musculoskeletal: Positive for back pain. Negative for joint swelling and gait problem.  Skin: Negative for rash.  Neurological: Negative for dizziness, tremors, speech difficulty and weakness.  Psychiatric/Behavioral: Positive for disturbed wake/sleep cycle. Negative for suicidal ideas, dysphoric mood and agitation. The patient is not nervous/anxious.        Objective:   Physical Exam  Constitutional: He is oriented to person, place, and time. He appears well-developed.  HENT:  Right Ear: External ear normal.  Left Ear: External ear normal.  Mouth/Throat: Oropharynx is clear and moist.       B wax  Eyes: Conjunctivae normal are normal. Pupils are equal, round, and reactive to light.  Neck: Normal range of motion. No JVD present. No thyromegaly present.  Cardiovascular: Normal rate, regular rhythm, normal heart sounds and intact distal pulses.  Exam reveals no gallop and no  friction rub.   No murmur heard. Pulmonary/Chest: Effort normal and breath sounds normal. No respiratory distress. He has no wheezes. He has no rales. He exhibits no tenderness.  Abdominal: Soft. Bowel sounds are normal. He exhibits no distension and no mass. There is no tenderness. There is no rebound and no guarding.  Genitourinary:       Per Dr Russella Dar 3 wks ago  Musculoskeletal: Normal range of motion. He exhibits tenderness (LS spine). He exhibits no edema.  Lymphadenopathy:    He has no cervical adenopathy.  Neurological: He is alert and oriented to person, place, and time. He has normal reflexes. No cranial nerve deficit. He exhibits normal muscle tone. Coordination normal.  Skin: Skin is warm and dry. No rash noted.  Psychiatric: He has a normal mood and affect. His behavior is normal. Judgment and thought content normal.  AKs on face  Lab Results  Component Value Date   WBC 5.9 08/14/2012   HGB 16.0 08/14/2012   HCT 48.5 08/14/2012   PLT 278.0 08/14/2012   GLUCOSE 98 08/14/2012   CHOL 225* 04/21/2012   TRIG 156.0* 04/21/2012   HDL 44.30 04/21/2012   LDLDIRECT 154.1 04/21/2012   LDLCALC  Value: 133        Total Cholesterol/HDL:CHD Risk Coronary Heart Disease Risk Table                     Men   Women  1/2 Average Risk   3.4   3.3  Average Risk       5.0   4.4  2  X Average Risk   9.6   7.1  3 X Average Risk  23.4   11.0        Use the calculated Patient Ratio above and the CHD Risk Table to determine the patient's CHD Risk.        ATP III CLASSIFICATION (LDL):  <100     mg/dL   Optimal  960-454  mg/dL   Near or Above                    Optimal  130-159  mg/dL   Borderline  098-119  mg/dL   High  >147     mg/dL   Very High* 07/10/5620   ALT 21 08/14/2012   AST 21 08/14/2012   NA 139 08/14/2012   K 4.7 08/14/2012   CL 105 08/14/2012   CREATININE 1.1 08/14/2012   BUN 15 08/14/2012   CO2 25 08/14/2012   TSH 0.48 04/21/2012   PSA 0.31 04/21/2012   INR 1.24 04/17/2010   HGBA1C 5.5 08/14/2012    Procedure Note :     Procedure : Cryosurgery   Indication:  Actinic keratosis(es)   Risks including unsuccessful procedure , bleeding, infection, bruising, scar, a need for a repeat  procedure and others were explained to the patient in detail as well as the benefits. Informed consent was obtained verbally.    3 lesion(s)  on  face  was/were treated with liquid nitrogen on a Q-tip in a usual fasion . Band-Aid was applied and antibiotic ointment was given for a later use.   Tolerated well. Complications none.       Assessment & Plan:

## 2012-08-19 NOTE — Assessment & Plan Note (Signed)
Continue with current prescription therapy as reflected on the Med list.  

## 2012-08-19 NOTE — Assessment & Plan Note (Signed)
Drink more water 

## 2012-08-19 NOTE — Patient Instructions (Signed)
   Postprocedure instructions :     Keep the wounds clean. You can wash them with liquid soap and water. Pat dry with gauze or a Kleenex tissue  Before applying antibiotic ointment and a Band-Aid.   You need to report immediately  if  any signs of infection develop.    

## 2012-08-19 NOTE — Assessment & Plan Note (Signed)
Continue with current prescription therapy as reflected on the Med list. Check labs next time

## 2012-08-30 ENCOUNTER — Other Ambulatory Visit: Payer: Self-pay | Admitting: Internal Medicine

## 2012-09-07 ENCOUNTER — Other Ambulatory Visit: Payer: Self-pay | Admitting: Internal Medicine

## 2012-11-06 ENCOUNTER — Other Ambulatory Visit: Payer: Self-pay | Admitting: Internal Medicine

## 2012-11-19 ENCOUNTER — Ambulatory Visit (INDEPENDENT_AMBULATORY_CARE_PROVIDER_SITE_OTHER): Payer: BC Managed Care – PPO | Admitting: Internal Medicine

## 2012-11-19 ENCOUNTER — Encounter: Payer: Self-pay | Admitting: Internal Medicine

## 2012-11-19 VITALS — BP 140/100 | HR 76 | Temp 98.0°F | Resp 16 | Wt 195.0 lb

## 2012-11-19 DIAGNOSIS — K219 Gastro-esophageal reflux disease without esophagitis: Secondary | ICD-10-CM

## 2012-11-19 DIAGNOSIS — I10 Essential (primary) hypertension: Secondary | ICD-10-CM

## 2012-11-19 DIAGNOSIS — L570 Actinic keratosis: Secondary | ICD-10-CM

## 2012-11-19 DIAGNOSIS — E291 Testicular hypofunction: Secondary | ICD-10-CM

## 2012-11-19 MED ORDER — TESTOSTERONE 20.25 MG/ACT (1.62%) TD GEL: 2.0000 | TRANSDERMAL | Status: DC

## 2012-11-19 MED ORDER — OXYCODONE HCL 15 MG PO TABS: 15.0000 mg | ORAL_TABLET | Freq: Four times a day (QID) | ORAL | Status: DC | PRN

## 2012-11-19 NOTE — Assessment & Plan Note (Signed)
Continue with current prescription therapy as reflected on the Med list.  

## 2012-11-19 NOTE — Progress Notes (Signed)
Subjective:    HPI   The patient is here to follow up on chronic LBP, GERD, hypogonadism controlled with medicines, diet and exercise. He passed a kidney stone - very painful - L side  Wt Readings from Last 3 Encounters:  08/19/12 196 lb (88.905 kg)  04/24/12 196 lb (88.905 kg)  04/02/12 196 lb (88.905 kg)   BP Readings from Last 3 Encounters:  08/19/12 130/90  04/24/12 130/90  04/02/12 174/90       Review of Systems  Constitutional: Positive for fatigue. Negative for appetite change and unexpected weight change.  HENT: Negative for nosebleeds, congestion, sore throat, sneezing, trouble swallowing and neck pain.   Eyes: Negative for itching and visual disturbance.  Respiratory: Negative for cough.   Cardiovascular: Negative for chest pain, palpitations and leg swelling.  Gastrointestinal: Negative for nausea, diarrhea, blood in stool and abdominal distention.  Genitourinary: Negative for frequency and hematuria.  Musculoskeletal: Positive for back pain. Negative for joint swelling and gait problem.  Skin: Negative for rash.  Neurological: Negative for dizziness, tremors, speech difficulty and weakness.  Psychiatric/Behavioral: Positive for disturbed wake/sleep cycle. Negative for suicidal ideas, dysphoric mood and agitation. The patient is not nervous/anxious.        Objective:   Physical Exam  Constitutional: He is oriented to person, place, and time. He appears well-developed.  HENT:  Right Ear: External ear normal.  Left Ear: External ear normal.  Mouth/Throat: Oropharynx is clear and moist.       B wax  Eyes: Conjunctivae normal are normal. Pupils are equal, round, and reactive to light.  Neck: Normal range of motion. No JVD present. No thyromegaly present.  Cardiovascular: Normal rate, regular rhythm, normal heart sounds and intact distal pulses.  Exam reveals no gallop and no friction rub.   No murmur heard. Pulmonary/Chest: Effort normal and breath sounds  normal. No respiratory distress. He has no wheezes. He has no rales. He exhibits no tenderness.  Abdominal: Soft. Bowel sounds are normal. He exhibits no distension and no mass. There is no tenderness. There is no rebound and no guarding.  Genitourinary:       Per Dr Russella Dar 3 wks ago  Musculoskeletal: Normal range of motion. He exhibits tenderness (LS spine). He exhibits no edema.  Lymphadenopathy:    He has no cervical adenopathy.  Neurological: He is alert and oriented to person, place, and time. He has normal reflexes. No cranial nerve deficit. He exhibits normal muscle tone. Coordination normal.  Skin: Skin is warm and dry. No rash noted.  Psychiatric: He has a normal mood and affect. His behavior is normal. Judgment and thought content normal.  AKs on face x 2  Lab Results  Component Value Date   WBC 5.9 08/14/2012   HGB 16.0 08/14/2012   HCT 48.5 08/14/2012   PLT 278.0 08/14/2012   GLUCOSE 98 08/14/2012   CHOL 225* 04/21/2012   TRIG 156.0* 04/21/2012   HDL 44.30 04/21/2012   LDLDIRECT 154.1 04/21/2012   LDLCALC  Value: 133        Total Cholesterol/HDL:CHD Risk Coronary Heart Disease Risk Table                     Men   Women  1/2 Average Risk   3.4   3.3  Average Risk       5.0   4.4  2 X Average Risk   9.6   7.1  3 X Average Risk  23.4  11.0        Use the calculated Patient Ratio above and the CHD Risk Table to determine the patient's CHD Risk.        ATP III CLASSIFICATION (LDL):  <100     mg/dL   Optimal  409-811  mg/dL   Near or Above                    Optimal  130-159  mg/dL   Borderline  914-782  mg/dL   High  >956     mg/dL   Very High* 12/26/863   ALT 21 08/14/2012   AST 21 08/14/2012   NA 139 08/14/2012   K 4.7 08/14/2012   CL 105 08/14/2012   CREATININE 1.1 08/14/2012   BUN 15 08/14/2012   CO2 25 08/14/2012   TSH 0.48 04/21/2012   PSA 0.31 04/21/2012   INR 1.24 04/17/2010   HGBA1C 5.5 08/14/2012   Procedure Note :     Procedure : Cryosurgery   Indication:  Actinic  keratosis(es)   Risks including unsuccessful procedure , bleeding, infection, bruising, scar, a need for a repeat  procedure and others were explained to the patient in detail as well as the benefits. Informed consent was obtained verbally.    2 lesion(s)  on  face  was/were treated with liquid nitrogen on a Q-tip in a usual fasion . Band-Aid was applied and antibiotic ointment was given for a later use.   Tolerated well. Complications none.       Assessment & Plan:

## 2012-12-11 ENCOUNTER — Encounter: Payer: Self-pay | Admitting: Internal Medicine

## 2012-12-11 ENCOUNTER — Telehealth: Payer: Self-pay | Admitting: Internal Medicine

## 2012-12-11 ENCOUNTER — Ambulatory Visit (INDEPENDENT_AMBULATORY_CARE_PROVIDER_SITE_OTHER): Payer: BC Managed Care – PPO | Admitting: Internal Medicine

## 2012-12-11 VITALS — BP 140/100 | HR 95 | Temp 98.4°F | Ht 71.0 in | Wt 194.4 lb

## 2012-12-11 DIAGNOSIS — F411 Generalized anxiety disorder: Secondary | ICD-10-CM

## 2012-12-11 DIAGNOSIS — I10 Essential (primary) hypertension: Secondary | ICD-10-CM

## 2012-12-11 DIAGNOSIS — J019 Acute sinusitis, unspecified: Secondary | ICD-10-CM

## 2012-12-11 MED ORDER — HYDROCODONE-HOMATROPINE 5-1.5 MG/5ML PO SYRP: 5.0000 mL | ORAL_SOLUTION | Freq: Four times a day (QID) | ORAL | Status: DC | PRN

## 2012-12-11 MED ORDER — LOSARTAN POTASSIUM 25 MG PO TABS: 25.0000 mg | ORAL_TABLET | Freq: Two times a day (BID) | ORAL | Status: DC

## 2012-12-11 MED ORDER — LEVOFLOXACIN 250 MG PO TABS: 250.0000 mg | ORAL_TABLET | Freq: Every day | ORAL | Status: DC

## 2012-12-11 NOTE — Assessment & Plan Note (Signed)
stable overall by history and exam, and pt to continue medical treatment as before,  to f/u any worsening symptoms or concerns 

## 2012-12-11 NOTE — Telephone Encounter (Signed)
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  +sore throat , body aches and congestion with cough non productive. +runny nose clear to yellow  Reviewed Health History In EMR: Yes  Reviewed Medications In EMR: Yes  Reviewed Allergies In EMR: Yes  Reviewed Surgeries / Procedures: No  Date of Onset of Symptoms: 12/08/2012  Treatments Tried: Cloracedin  Treatments Tried Worked: No  Guideline(s) Used:  Sore Throat  Disposition Per Guideline:   See Today in Office  Reason For Disposition Reached:   Earache also present  Advice Given:  For Relief of Sore Throat Pain:  Sip warm chicken broth or apple juice.  Suck on hard candy or a throat lozenge (over-the-counter).  Gargle warm salt water 3 times daily (1 teaspoon of salt in 8 oz or 240 ml of warm water).  Avoid cigarette smoke.  Pain Medicines:  Ibuprofen (e.g., Motrin, Advil):  Take 400 mg (two 200 mg pills) by mouth every 6 hours.  Soft Diet:   Cold drinks and milk shakes are especially good (Reason: swollen tonsils can make some foods hard to swallow).  Call Back If:  Sore throat is the main symptom and it lasts longer than 24 hours  Sore throat is mild but lasts longer than 4 days  Fever lasts longer than 3 days  You become worse.  Appointment Scheduled:  12/11/2012 10:30:00 Appointment Scheduled Provider:  Oliver Barre (Adults only)

## 2012-12-11 NOTE — Assessment & Plan Note (Signed)
Mild to mod, for antibx course,  to f/u any worsening symptoms or concerns 

## 2012-12-11 NOTE — Progress Notes (Signed)
Subjective:    Patient ID: Cody Frazier, male    DOB: 09-Aug-1946, 67 y.o.   MRN: [id number]  HPI   Here with 2-3 days acute onset fever, facial pain, pressure, headache, general weakness and malaise, and greenish d/c, with mild ST and cough, but pt denies chest pain, wheezing, increased sob or doe, orthopnea, PND, increased LE swelling, palpitations, dizziness or syncope.  Pt denies polydipsia, polyuria.  Pt denies new neurological symptoms such as new headache, or facial or extremity weakness or numbness  BP has been mild elevated with taking the losartan 25 mg in the AM only for the past approx 6 mo after d/w PCP. Denies worsening depressive symptoms, suicidal ideation, or panic Past Medical History  Diagnosis Date  . Anemia, iron deficiency   . Hyperplastic colonic polyp 01/2000  . Diverticulosis of colon   . Hypertension   . Chronic LBP   . Nephrolithiasis     hx of B  . GERD (gastroesophageal reflux disease)   . Peptic ulcer disease with hemorrhage 08/2008    and GOO H. Pylori Ab negative  . BPH (benign prostatic hyperplasia)   . Allergic rhinitis   . DDD (degenerative disc disease)   . Hyperlipidemia   . Atrial arrhythmia   . Renal cyst   . Duodenal stricture   . Gastric outlet obstruction   . Hemorrhoids   . Barrett's esophagus without dysplasia   . Atrial fib/flutter, transient     following prior surgeries x 2   Past Surgical History  Procedure Date  . Back surgery 02/2003    L5  . Lumbar fusion 2009  . Hiatal hernia repair   . Appendectomy   . Bilroth i procedure 2011    Dr Michaell Cowing    reports that he has quit smoking. He has never used smokeless tobacco. He reports that he does not drink alcohol or use illicit drugs. family history includes Cancer in his cousin, father, mother, and paternal uncle; Colon cancer (age of onset:60) in his paternal uncles; Colon cancer (age of onset:68) in his father; Colon cancer (age of onset:72) in his mother; Colon polyps in his  father and mother; and Hypertension in his other.  There is no history of Stomach cancer. Allergies  Allergen Reactions  . Acetaminophen     REACTION: ?? ulcers  . Codeine Phosphate Swelling   Current Outpatient Prescriptions on File Prior to Visit  Medication Sig Dispense Refill  . Alum Hydroxide-Mag Trisilicate (GAVISCON) 80-14.2 MG CHEW Chew by mouth as needed.        . busPIRone (BUSPAR) 7.5 MG tablet TAKE 1 TO 2 TABLETS DAILY AS DIRECTED FOR ANXIETY.  120 tablet  0  . diphenoxylate-atropine (LOMOTIL) 2.5-0.025 MG per tablet Take 1-2 tablets by mouth 2 (two) times daily as needed. For diarrhea        . ferrous sulfate 325 (65 FE) MG tablet Take 325 mg by mouth 2 (two) times daily.        . fluticasone (FLONASE) 50 MCG/ACT nasal spray 2 sprays by Nasal route daily.  16 g  6  . losartan (COZAAR) 25 MG tablet Take 1 tablet (25 mg total) by mouth 2 (two) times daily.  60 tablet  11  . LYRICA 50 MG capsule Take 50 mg by mouth 2 (two) times daily.       . metoCLOPramide (REGLAN) 5 MG tablet Take 1 tablet (5 mg total) by mouth 3 (three) times daily.  270 tablet  1  .  Multiple Vitamins-Minerals (CENTRUM SILVER PO) Take by mouth daily.        Marland Kitchen oxyCODONE (ROXICODONE) 15 MG immediate release tablet Take 1 tablet (15 mg total) by mouth 4 (four) times daily as needed for pain. For pain - Fill on or after 01/25/13 OK to fill 1-2 days earlier on mths w/31 days  120 tablet  0  . pantoprazole (PROTONIX) 40 MG tablet TAKE 1 TABLET TWICE A DAY  60 tablet  5  . Polysacch Fe Cmp-Fe Heme Poly (BIFERA) 28 MG TABS Take by mouth daily.        . Probiotic Product (FLORA-Q 2) CAPS Take by mouth 2 (two) times daily.        . promethazine (PHENERGAN) 25 MG tablet Take 25 mg by mouth 2 (two) times daily as needed. For nausea        . psyllium (METAMUCIL) 58.6 % packet Take 1 packet by mouth 2 (two) times daily.        Marland Kitchen scopolamine (TRANSDERM-SCOP) 1.5 MG Place 1 patch (1.5 mg total) onto the skin every 3 (three)  days.  4 patch  1  . sucralfate (CARAFATE) 1 G tablet Take 1 g by mouth 2 (two) times daily.        . Testosterone (ANDROGEL PUMP) 20.25 MG/ACT (1.62%) GEL Place 2 Act onto the skin every morning.  75 g  5   Review of Systems  Constitutional: Negative for unexpected weight change, or unusual diaphoresis  HENT: Negative for tinnitus.   Eyes: Negative for photophobia and visual disturbance.  Respiratory: Negative for choking and stridor.   Gastrointestinal: Negative for vomiting and blood in stool.  Genitourinary: Negative for hematuria and decreased urine volume.  Musculoskeletal: Negative for acute joint swelling Skin: Negative for color change and wound.  Neurological: Negative for tremors and numbness other than noted  Psychiatric/Behavioral: Negative for decreased concentration or  hyperactivity.       Objective:   Physical Exam BP 140/100  Pulse 95  Temp 98.4 F (36.9 C) (Oral)  Ht 5\' 11"  (1.803 m)  Wt 194 lb 6 oz (88.168 kg)  BMI 27.11 kg/m2  SpO2 97% VS noted,  Constitutional: Pt appears well-developed and well-nourished.  HENT: Head: NCAT.  Right Ear: External ear normal.  Left Ear: External ear normal.  Bilat tm's with mild erythema.  Max sinus areas mild tender.  Pharynx with mild erythema, no exudate Eyes: Conjunctivae and EOM are normal. Pupils are equal, round, and reactive to light.  Neck: Normal range of motion. Neck supple.  Cardiovascular: Normal rate and regular rhythm.   Pulmonary/Chest: Effort normal and breath sounds normal.  Neurological: Pt is alert. Not confused  Skin: Skin is warm. No erythema.  Psychiatric: Pt behavior is normal. Thought content normal. not depressed affect    Assessment & Plan:

## 2012-12-11 NOTE — Patient Instructions (Addendum)
Please take all new medication as prescribed - the antibiotic, cough medicine OK to change the losartan back to 25 mg twice per day Please continue all other medications as before Please have the pharmacy call with any other refills you may need. Please remember to sign up for My Chart if you have not done so, as this will be important to you in the future with finding out test results, communicating by private email, and scheduling acute appointments online when needed.

## 2012-12-11 NOTE — Assessment & Plan Note (Signed)
Mild persistent uncontrolled, ok to change the losartan back to the 25 bid, f/u bp at home and next visit

## 2013-01-06 ENCOUNTER — Other Ambulatory Visit: Payer: Self-pay | Admitting: Internal Medicine

## 2013-02-12 ENCOUNTER — Other Ambulatory Visit (INDEPENDENT_AMBULATORY_CARE_PROVIDER_SITE_OTHER): Payer: BC Managed Care – PPO

## 2013-02-12 DIAGNOSIS — L570 Actinic keratosis: Secondary | ICD-10-CM

## 2013-02-12 DIAGNOSIS — K219 Gastro-esophageal reflux disease without esophagitis: Secondary | ICD-10-CM

## 2013-02-12 DIAGNOSIS — E291 Testicular hypofunction: Secondary | ICD-10-CM

## 2013-02-12 DIAGNOSIS — I10 Essential (primary) hypertension: Secondary | ICD-10-CM

## 2013-02-12 LAB — CBC WITH DIFFERENTIAL/PLATELET
Basophils Absolute: 0 10*3/uL (ref 0.0–0.1)
Basophils Relative: 0.5 % (ref 0.0–3.0)
Eosinophils Absolute: 0.2 10*3/uL (ref 0.0–0.7)
Eosinophils Relative: 3 % (ref 0.0–5.0)
HCT: 43.5 % (ref 39.0–52.0)
Hemoglobin: 14.8 g/dL (ref 13.0–17.0)
Lymphocytes Relative: 29.4 % (ref 12.0–46.0)
Lymphs Abs: 1.7 10*3/uL (ref 0.7–4.0)
MCHC: 34 g/dL (ref 30.0–36.0)
MCV: 90.9 fl (ref 78.0–100.0)
Monocytes Absolute: 0.6 10*3/uL (ref 0.1–1.0)
Monocytes Relative: 9.6 % (ref 3.0–12.0)
Neutro Abs: 3.3 10*3/uL (ref 1.4–7.7)
Neutrophils Relative %: 57.5 % (ref 43.0–77.0)
Platelets: 266 10*3/uL (ref 150.0–400.0)
RBC: 4.79 Mil/uL (ref 4.22–5.81)
RDW: 12.4 % (ref 11.5–14.6)
WBC: 5.8 10*3/uL (ref 4.5–10.5)

## 2013-02-12 LAB — BASIC METABOLIC PANEL
BUN: 21 mg/dL (ref 6–23)
CO2: 27 mEq/L (ref 19–32)
Calcium: 9.8 mg/dL (ref 8.4–10.5)
Chloride: 103 mEq/L (ref 96–112)
Creatinine, Ser: 1.1 mg/dL (ref 0.4–1.5)
GFR: 68.71 mL/min (ref >60.00)
Glucose, Bld: 92 mg/dL (ref 70–99)
Potassium: 4.8 mEq/L (ref 3.5–5.1)
Sodium: 138 mEq/L (ref 135–145)

## 2013-02-12 LAB — HEPATIC FUNCTION PANEL
ALT: 27 U/L (ref 0–53)
AST: 31 U/L (ref 0–37)
Albumin: 4 g/dL (ref 3.5–5.2)
Alkaline Phosphatase: 100 U/L (ref 39–117)
Bilirubin, Direct: 0.1 mg/dL (ref 0.0–0.3)
Total Bilirubin: 0.6 mg/dL (ref 0.3–1.2)
Total Protein: 7.2 g/dL (ref 6.0–8.3)

## 2013-02-12 LAB — LIPID PANEL
Cholesterol: 236 mg/dL — ABNORMAL HIGH (ref 0–200)
HDL: 40.7 mg/dL (ref >39.00)
Total CHOL/HDL Ratio: 6
Triglycerides: 234 mg/dL — ABNORMAL HIGH (ref 0.0–149.0)
VLDL: 46.8 mg/dL — ABNORMAL HIGH (ref 0.0–40.0)

## 2013-02-12 LAB — TESTOSTERONE: Testosterone: 172.6 ng/dL — ABNORMAL LOW (ref 350.00–890.00)

## 2013-02-12 LAB — LDL CHOLESTEROL, DIRECT: Direct LDL: 155.2 mg/dL

## 2013-02-20 ENCOUNTER — Ambulatory Visit (INDEPENDENT_AMBULATORY_CARE_PROVIDER_SITE_OTHER): Payer: BC Managed Care – PPO | Admitting: Internal Medicine

## 2013-02-20 ENCOUNTER — Encounter: Payer: Self-pay | Admitting: Internal Medicine

## 2013-02-20 VITALS — BP 156/100 | HR 80 | Temp 98.3°F | Resp 16 | Wt 198.0 lb

## 2013-02-20 DIAGNOSIS — R5381 Other malaise: Secondary | ICD-10-CM | POA: Diagnosis not present

## 2013-02-20 DIAGNOSIS — I10 Essential (primary) hypertension: Secondary | ICD-10-CM | POA: Diagnosis not present

## 2013-02-20 MED ORDER — SCOPOLAMINE 1 MG/3DAYS TD PT72: 1.0000 | MEDICATED_PATCH | TRANSDERMAL | Status: DC

## 2013-02-20 MED ORDER — OXYCODONE HCL 15 MG PO TABS: 15.0000 mg | ORAL_TABLET | Freq: Four times a day (QID) | ORAL | Status: DC | PRN

## 2013-02-20 NOTE — Progress Notes (Signed)
Subjective:    HPI   The patient is here to follow up on chronic LBP, GERD, hypogonadism controlled with medicines, diet and exercise. He is retiring in 3 wks  Wt Readings from Last 3 Encounters:  08/19/12 196 lb (88.905 kg)  04/24/12 196 lb (88.905 kg)  04/02/12 196 lb (88.905 kg)   BP Readings from Last 3 Encounters:  08/19/12 130/90  04/24/12 130/90  04/02/12 174/90       Review of Systems  Constitutional: Positive for fatigue. Negative for appetite change and unexpected weight change.  HENT: Negative for nosebleeds, congestion, sore throat, sneezing, trouble swallowing and neck pain.   Eyes: Negative for itching and visual disturbance.  Respiratory: Negative for cough.   Cardiovascular: Negative for chest pain, palpitations and leg swelling.  Gastrointestinal: Negative for nausea, diarrhea, blood in stool and abdominal distention.  Genitourinary: Negative for frequency and hematuria.  Musculoskeletal: Positive for back pain. Negative for joint swelling and gait problem.  Skin: Negative for rash.  Neurological: Negative for dizziness, tremors, speech difficulty and weakness.  Psychiatric/Behavioral: Positive for disturbed wake/sleep cycle. Negative for suicidal ideas, dysphoric mood and agitation. The patient is not nervous/anxious.        Objective:   Physical Exam  Constitutional: He is oriented to person, place, and time. He appears well-developed.  HENT:  Right Ear: External ear normal.  Left Ear: External ear normal.  Mouth/Throat: Oropharynx is clear and moist.  Eyes: Conjunctivae normal are normal. Pupils are equal, round, and reactive to light.  Neck: Normal range of motion. No JVD present. No thyromegaly present.  Cardiovascular: Normal rate, regular rhythm, normal heart sounds and intact distal pulses.  Exam reveals no gallop and no friction rub.   No murmur heard. Pulmonary/Chest: Effort normal and breath sounds normal. No respiratory distress. He has  no wheezes. He has no rales. He exhibits no tenderness.  Abdominal: Soft. Bowel sounds are normal. He exhibits no distension and no mass. There is no tenderness. There is no rebound and no guarding.  Genitourinary: n/a Musculoskeletal: Normal range of motion. He exhibits tenderness (LS spine). He exhibits no edema.  Lymphadenopathy:    He has no cervical adenopathy.  Neurological: He is alert and oriented to person, place, and time. He has normal reflexes. No cranial nerve deficit. He exhibits normal muscle tone. Coordination normal.  Skin: Skin is warm and dry. No rash noted.  Psychiatric: He has a normal mood and affect. His behavior is normal. Judgment and thought content normal.  AKs on face x 2  Lab Results  Component Value Date   WBC 5.9 08/14/2012   HGB 16.0 08/14/2012   HCT 48.5 08/14/2012   PLT 278.0 08/14/2012   GLUCOSE 98 08/14/2012   CHOL 225* 04/21/2012   TRIG 156.0* 04/21/2012   HDL 44.30 04/21/2012   LDLDIRECT 154.1 04/21/2012   LDLCALC  Value: 133        Total Cholesterol/HDL:CHD Risk Coronary Heart Disease Risk Table                     Men   Women  1/2 Average Risk   3.4   3.3  Average Risk       5.0   4.4  2 X Average Risk   9.6   7.1  3 X Average Risk  23.4   11.0        Use the calculated Patient Ratio above and the CHD Risk Table to determine the patient's  CHD Risk.        ATP III CLASSIFICATION (LDL):  <100     mg/dL   Optimal  366-440  mg/dL   Near or Above                    Optimal  130-159  mg/dL   Borderline  347-425  mg/dL   High  >956     mg/dL   Very High* 3/87/5643   ALT 21 08/14/2012   AST 21 08/14/2012   NA 139 08/14/2012   K 4.7 08/14/2012   CL 105 08/14/2012   CREATININE 1.1 08/14/2012   BUN 15 08/14/2012   CO2 25 08/14/2012   TSH 0.48 04/21/2012   PSA 0.31 04/21/2012   INR 1.24 04/17/2010   HGBA1C 5.5 08/14/2012      Assessment & Plan:

## 2013-02-20 NOTE — Assessment & Plan Note (Signed)
Continue with current prescription therapy as reflected on the Med list.  

## 2013-02-27 ENCOUNTER — Other Ambulatory Visit (HOSPITAL_COMMUNITY)
Admission: RE | Admit: 2013-02-27 | Discharge: 2013-02-27 | Disposition: A | Discharge status: Home / Self Care | Payer: Medicare Other | Source: Ambulatory Visit | Attending: Internal Medicine | Admitting: Internal Medicine

## 2013-02-27 ENCOUNTER — Ambulatory Visit (INDEPENDENT_AMBULATORY_CARE_PROVIDER_SITE_OTHER): Payer: Medicare Other | Admitting: Internal Medicine

## 2013-02-27 ENCOUNTER — Encounter: Payer: Self-pay | Admitting: Internal Medicine

## 2013-02-27 VITALS — BP 140/90 | Temp 97.8°F | Wt 199.0 lb

## 2013-02-27 DIAGNOSIS — L821 Other seborrheic keratosis: Secondary | ICD-10-CM | POA: Insufficient documentation

## 2013-02-27 DIAGNOSIS — D485 Neoplasm of uncertain behavior of skin: Secondary | ICD-10-CM | POA: Diagnosis not present

## 2013-02-27 DIAGNOSIS — D229 Melanocytic nevi, unspecified: Secondary | ICD-10-CM

## 2013-02-27 NOTE — Progress Notes (Signed)
  Subjective:    Patient ID: Cody Frazier, male    DOB: November 17, 1945, 67 y.o.   MRN: [id number]  HPI    Review of Systems     Objective:   Physical Exam    Procedure Note :     Procedure :  Skin biopsy   Indication:  Changing mole (s ),  Suspicious lesion(s)   Risks including unsuccessful procedure , bleeding, infection, bruising, scar, a need for another complete procedure and others were explained to the patient in detail as well as the benefits. Informed consent was obtained and signed.   The patient was placed in a decubitus position.  Lesion #1 on  RLQ   measuring  11x9   mm   Skin over lesion #1  was prepped with Betadine and alcohol  and anesthetized with 1 cc of 2% lidocaine and epinephrine, using a 25-gauge 1 inch needle.  Shave biopsy with a sterile Dermablade was carried out in the usual fashion. Hyfrecator was used to destroy the rest of the lesion potentially left behind and for hemostasis. Band-Aid was applied with antibiotic ointment.    Lesion #2 on L neck    measuring 3  mm   Skin over lesion #2  was prepped with Betadine and alcohol  and anesthetized with 1 cc of 2% lidocaine and epinephrine, using a 25-gauge 1 inch needle.  Shave biopsy with a sterile Dermablade was carried out in the usual fashion. Hyfrecator was used to destroy the rest of the lesion potentially left behind and for hemostasis. Band-Aid was applied with antibiotic ointment.  Lesion #3-4 on L neck   measuring  2 mm each   Skin over lesion #3  was prepped with Betadine and alcohol  and anesthetized with 1 cc of 2% lidocaine and epinephrine, using a 25-gauge 1 inch needle.  Shave biopsy with a sterile Dermablade was carried out in the usual fashion. Hyfrecator was used to destroy the rest of the lesion potentially left behind and for hemostasis. Band-Aid was applied with antibiotic ointment.   Tolerated well. Complications none.  #1 was sent to path lab    Postprocedure instructions :    A Band-Aid should be  changed twice daily. You can take a shower tomorrow.  Keep the wounds clean. You can wash them with liquid soap and water. Pat dry with gauze or a Kleenex tissue  Before applying antibiotic ointment and a Band-Aid.   You need to report immediately  if fever, chills or any signs of infection develop.    The biopsy results should be available in 1 -2 weeks.       Assessment & Plan:

## 2013-02-27 NOTE — Patient Instructions (Addendum)
Postprocedure instructions :    A Band-Aid should be  changed twice daily. You can take a shower tomorrow.  Keep the wounds clean. You can wash them with liquid soap and water. Pat dry with gauze or a Kleenex tissue  Before applying antibiotic ointment and a Band-Aid.   You need to report immediately  if fever, chills or any signs of infection develop.    The biopsy results should be available in 1 -2 weeks. 

## 2013-03-01 DIAGNOSIS — D485 Neoplasm of uncertain behavior of skin: Secondary | ICD-10-CM | POA: Insufficient documentation

## 2013-03-01 NOTE — Assessment & Plan Note (Signed)
See procedure 

## 2013-03-08 ENCOUNTER — Other Ambulatory Visit: Payer: Self-pay | Admitting: Internal Medicine

## 2013-03-26 ENCOUNTER — Telehealth: Payer: Self-pay | Admitting: *Deleted

## 2013-03-26 ENCOUNTER — Other Ambulatory Visit: Payer: Self-pay | Admitting: Internal Medicine

## 2013-03-26 NOTE — Telephone Encounter (Signed)
Pt left vm stating he is having nasal congestion and drainage and general muscle aches. He denies fever and is requesting advisement from MD. I called pt and left a detailed mess informing him PCP is out of office until tomorrow. I informed him to try OTC Tylenol combo products and OTC saline nasal sprays for his nasal congestion and body aches and to call our office if his symptoms worsen or do not improve.

## 2013-05-12 ENCOUNTER — Other Ambulatory Visit: Payer: Self-pay | Admitting: Internal Medicine

## 2013-05-22 ENCOUNTER — Ambulatory Visit (INDEPENDENT_AMBULATORY_CARE_PROVIDER_SITE_OTHER): Payer: Medicare Other | Admitting: Internal Medicine

## 2013-05-22 ENCOUNTER — Other Ambulatory Visit: Payer: Self-pay | Admitting: *Deleted

## 2013-05-22 ENCOUNTER — Encounter: Payer: Self-pay | Admitting: Internal Medicine

## 2013-05-22 VITALS — BP 138/98 | HR 80 | Temp 97.9°F | Resp 16 | Wt 199.0 lb

## 2013-05-22 DIAGNOSIS — M545 Low back pain, unspecified: Secondary | ICD-10-CM

## 2013-05-22 DIAGNOSIS — D509 Iron deficiency anemia, unspecified: Secondary | ICD-10-CM

## 2013-05-22 DIAGNOSIS — E785 Hyperlipidemia, unspecified: Secondary | ICD-10-CM

## 2013-05-22 DIAGNOSIS — I10 Essential (primary) hypertension: Secondary | ICD-10-CM

## 2013-05-22 DIAGNOSIS — E291 Testicular hypofunction: Secondary | ICD-10-CM

## 2013-05-22 MED ORDER — OXYCODONE HCL 15 MG PO TABS: 15.0000 mg | ORAL_TABLET | Freq: Four times a day (QID) | ORAL | Status: DC | PRN

## 2013-05-22 NOTE — Assessment & Plan Note (Signed)
Continue with current prescription therapy as reflected on the Med list.  

## 2013-05-22 NOTE — Progress Notes (Signed)
Subjective:    HPI   The patient is here to follow up on chronic LBP, GERD, hypogonadism controlled with medicines, diet and exercise. He is retired. Working part time  [employer] from Last 3 Encounters:  08/19/12 196 lb (88.905 kg)  04/24/12 196 lb (88.905 kg)  04/02/12 196 lb (88.905 kg)   BP Readings from Last 3 Encounters:  08/19/12 130/90  04/24/12 130/90  04/02/12 174/90       Review of Systems  Constitutional: Positive for fatigue. Negative for appetite change and unexpected weight change.  HENT: Negative for nosebleeds, congestion, sore throat, sneezing, trouble swallowing and neck pain.   Eyes: Negative for itching and visual disturbance.  Respiratory: Negative for cough.   Cardiovascular: Negative for chest pain, palpitations and leg swelling.  Gastrointestinal: Negative for nausea, diarrhea, blood in stool and abdominal distention.  Genitourinary: Negative for frequency and hematuria.  Musculoskeletal: Positive for back pain. Negative for joint swelling and gait problem.  Skin: Negative for rash.  Neurological: Negative for dizziness, tremors, speech difficulty and weakness.  Psychiatric/Behavioral: Positive for disturbed wake/sleep cycle. Negative for suicidal ideas, dysphoric mood and agitation. The patient is not nervous/anxious.        Objective:   Physical Exam  Constitutional: He is oriented to person, place, and time. He appears well-developed.  HENT:  Right Ear: External ear normal.  Left Ear: External ear normal.  Mouth/Throat: Oropharynx is clear and moist.  Eyes: Conjunctivae normal are normal. Pupils are equal, round, and reactive to light.  Neck: Normal range of motion. No JVD present. No thyromegaly present.  Cardiovascular: Normal rate, regular rhythm, normal heart sounds and intact distal pulses.  Exam reveals no gallop and no friction rub.   No murmur heard. Pulmonary/Chest: Effort normal and breath sounds normal. No respiratory  distress. He has no wheezes. He has no rales. He exhibits no tenderness.  Abdominal: Soft. Bowel sounds are normal. He exhibits no distension and no mass. There is no tenderness. There is no rebound and no guarding.  Genitourinary: n/a Musculoskeletal: Normal range of motion. He exhibits tenderness (LS spine). He exhibits no edema.  Lymphadenopathy:    He has no cervical adenopathy.  Neurological: He is alert and oriented to person, place, and time. He has normal reflexes. No cranial nerve deficit. He exhibits normal muscle tone. Coordination normal.  Skin: Skin is warm and dry. No rash noted.  Psychiatric: He has a normal mood and affect. His behavior is normal. Judgment and thought content normal.  AKs on face x 2  Lab Results  Component Value Date   WBC 5.9 08/14/2012   HGB 16.0 08/14/2012   HCT 48.5 08/14/2012   PLT 278.0 08/14/2012   GLUCOSE 98 08/14/2012   CHOL 225* 04/21/2012   TRIG 156.0* 04/21/2012   HDL 44.30 04/21/2012   LDLDIRECT 154.1 04/21/2012   LDLCALC  Value: 133        Total Cholesterol/HDL:CHD Risk Coronary Heart Disease Risk Table                     Men   Women  1/2 Average Risk   3.4   3.3  Average Risk       5.0   4.4  2 X Average Risk   9.6   7.1  3 X Average Risk  23.4   11.0        Use the calculated Patient Ratio above and the CHD Risk Table to determine the patient's  CHD Risk.        ATP III CLASSIFICATION (LDL):  <100     mg/dL   Optimal  161-096  mg/dL   Near or Above                    Optimal  130-159  mg/dL   Borderline  045-409  mg/dL   High  >811     mg/dL   Very High* 07/26/7828   ALT 21 08/14/2012   AST 21 08/14/2012   NA 139 08/14/2012   K 4.7 08/14/2012   CL 105 08/14/2012   CREATININE 1.1 08/14/2012   BUN 15 08/14/2012   CO2 25 08/14/2012   TSH 0.48 04/21/2012   PSA 0.31 04/21/2012   INR 1.24 04/17/2010   HGBA1C 5.5 08/14/2012      Assessment & Plan:

## 2013-05-22 NOTE — Assessment & Plan Note (Signed)
Labs

## 2013-05-22 NOTE — Assessment & Plan Note (Signed)
Androgel - not covered

## 2013-06-10 ENCOUNTER — Other Ambulatory Visit: Payer: Self-pay | Admitting: Internal Medicine

## 2013-06-15 ENCOUNTER — Telehealth: Payer: Self-pay | Admitting: *Deleted

## 2013-06-15 NOTE — Telephone Encounter (Signed)
Pantoprazole PA is approved from 06/12/13 until 06/12/14. Pharmacy informed.

## 2013-07-09 ENCOUNTER — Other Ambulatory Visit: Payer: Self-pay | Admitting: Internal Medicine

## 2013-08-19 ENCOUNTER — Other Ambulatory Visit: Payer: Self-pay | Admitting: *Deleted

## 2013-08-19 ENCOUNTER — Other Ambulatory Visit (INDEPENDENT_AMBULATORY_CARE_PROVIDER_SITE_OTHER): Payer: Medicare Other

## 2013-08-19 ENCOUNTER — Other Ambulatory Visit: Payer: Medicare Other

## 2013-08-19 DIAGNOSIS — D509 Iron deficiency anemia, unspecified: Secondary | ICD-10-CM | POA: Diagnosis not present

## 2013-08-19 DIAGNOSIS — I10 Essential (primary) hypertension: Secondary | ICD-10-CM | POA: Diagnosis not present

## 2013-08-19 DIAGNOSIS — N400 Enlarged prostate without lower urinary tract symptoms: Secondary | ICD-10-CM

## 2013-08-19 DIAGNOSIS — Z0000 Encounter for general adult medical examination without abnormal findings: Secondary | ICD-10-CM | POA: Diagnosis not present

## 2013-08-19 LAB — LIPID PANEL
Cholesterol: 277 mg/dL — ABNORMAL HIGH (ref 0–200)
HDL: 44.3 mg/dL (ref >39.00)
Total CHOL/HDL Ratio: 6
Triglycerides: 313 mg/dL — ABNORMAL HIGH (ref 0.0–149.0)
VLDL: 62.6 mg/dL — ABNORMAL HIGH (ref 0.0–40.0)

## 2013-08-19 LAB — LDL CHOLESTEROL, DIRECT: Direct LDL: 187.5 mg/dL

## 2013-08-19 LAB — TESTOSTERONE: Testosterone: 136.25 ng/dL — ABNORMAL LOW (ref 350.00–890.00)

## 2013-08-19 LAB — HEPATIC FUNCTION PANEL
ALT: 32 U/L (ref 0–53)
AST: 33 U/L (ref 0–37)
Albumin: 4.1 g/dL (ref 3.5–5.2)
Alkaline Phosphatase: 101 U/L (ref 39–117)
Bilirubin, Direct: 0 mg/dL (ref 0.0–0.3)
Total Bilirubin: 0.6 mg/dL (ref 0.3–1.2)
Total Protein: 7.2 g/dL (ref 6.0–8.3)

## 2013-08-19 LAB — BASIC METABOLIC PANEL
BUN: 17 mg/dL (ref 6–23)
CO2: 25 mEq/L (ref 19–32)
Calcium: 10 mg/dL (ref 8.4–10.5)
Chloride: 106 mEq/L (ref 96–112)
Creatinine, Ser: 1.1 mg/dL (ref 0.4–1.5)
GFR: 73.86 mL/min (ref >60.00)
Glucose, Bld: 109 mg/dL — ABNORMAL HIGH (ref 70–99)
Potassium: 4.4 mEq/L (ref 3.5–5.1)
Sodium: 142 mEq/L (ref 135–145)

## 2013-08-20 ENCOUNTER — Other Ambulatory Visit: Payer: Self-pay

## 2013-08-20 MED ORDER — PANTOPRAZOLE SODIUM 40 MG PO TBEC: DELAYED_RELEASE_TABLET | ORAL | Status: DC

## 2013-08-20 NOTE — Telephone Encounter (Signed)
Received fax from pharmacy requesting a 90 day supply of Pantoprazole.

## 2013-08-25 ENCOUNTER — Ambulatory Visit (INDEPENDENT_AMBULATORY_CARE_PROVIDER_SITE_OTHER): Payer: Medicare Other | Admitting: Internal Medicine

## 2013-08-25 ENCOUNTER — Encounter: Payer: Self-pay | Admitting: Internal Medicine

## 2013-08-25 VITALS — BP 160/108 | HR 80 | Temp 98.3°F | Resp 16 | Wt 203.0 lb

## 2013-08-25 DIAGNOSIS — K219 Gastro-esophageal reflux disease without esophagitis: Secondary | ICD-10-CM

## 2013-08-25 DIAGNOSIS — M545 Low back pain, unspecified: Secondary | ICD-10-CM | POA: Diagnosis not present

## 2013-08-25 DIAGNOSIS — D509 Iron deficiency anemia, unspecified: Secondary | ICD-10-CM

## 2013-08-25 DIAGNOSIS — E291 Testicular hypofunction: Secondary | ICD-10-CM

## 2013-08-25 DIAGNOSIS — Z23 Encounter for immunization: Secondary | ICD-10-CM

## 2013-08-25 MED ORDER — ATORVASTATIN CALCIUM 10 MG PO TABS: 10.0000 mg | ORAL_TABLET | Freq: Every day | ORAL | Status: DC

## 2013-08-25 MED ORDER — OXYCODONE HCL 15 MG PO TABS: 15.0000 mg | ORAL_TABLET | Freq: Four times a day (QID) | ORAL | Status: DC | PRN

## 2013-08-25 MED ORDER — CLOTRIMAZOLE-BETAMETHASONE 1-0.05 % EX CREA: TOPICAL_CREAM | Freq: Two times a day (BID) | CUTANEOUS | Status: DC

## 2013-08-25 NOTE — Assessment & Plan Note (Signed)
Diet and wt loss

## 2013-08-25 NOTE — Patient Instructions (Addendum)
Gluten free trial (no wheat products) for 4-6 weeks. OK to use gluten-free bread and gluten-free pasta.  Milk free trial (no milk, ice cream, cheese and yogurt) for 4-6 weeks. OK to use almond, coconut, rice or soy milk. "Almond breeze" brand tastes good.  There are natural ways to boost your testosterone:  1. Lose Weight If you're overweight, shedding the excess pounds may increase your testosterone levels, according to multiple research. Overweight men are more likely to have low testosterone levels to begin with, so this is an important trick to increase your body's testosterone production when you need it most.   2. Strength Training    Strength training is also known to boost testosterone levels, provided you are doing so intensely enough. When strength training to boost testosterone, you'll want to increase the weight and lower your number of reps, and then focus on exercises that work a large number of muscles.  3. Optimize Your Vitamin D Levels Vitamin D, a steroid hormone, is essential for the healthy development of the nucleus of the sperm cell, and helps maintain semen quality and sperm count. Vitamin D also increases levels of testosterone, which may boost libido. In one study, overweight men who were given vitamin D supplements had a significant increase in testosterone levels after one year.  4. Reduce Stress When you're under a lot of stress, your body releases high levels of the stress hormone cortisol. This hormone actually blocks the effects of testosterone, presumably because, from a biological standpoint, testosterone-associated behaviors (mating, competing, aggression) may have lowered your chances of survival in an emergency (hence, the "fight or flight" response is dominant, courtesy of cortisol).  5. Limit or Eliminate Sugar from Your Diet Testosterone levels decrease after you eat sugar, which is likely because the sugar leads to a high insulin level, another factor  leading to low testosterone.  6. Eat Healthy Fats By healthy, this means not only mon- and polyunsaturated fats, like that found in avocadoes and nuts, but also saturated, as these are essential for building testosterone. Research shows that a diet with less than 40 percent of energy as fat (and that mainly from animal sources, i.e. saturated) lead to a decrease in testosterone levels.  It's important to understand that your body requires saturated fats from animal and vegetable sources (such as meat, dairy, certain oils, and tropical plants like coconut) for optimal functioning, and if you neglect this important food group in favor of sugar, grains and other starchy carbs, your health and weight are almost guaranteed to suffer. Examples of healthy fats you can eat more of to give your testosterone levels a boost include:  Olives and Olive oil  Coconuts and coconut oil Butter made from organic milk  Raw nuts, such as, almonds or pecans Eggs Avocados   Meats Palm oil Unheated organic nut oils   7. "Testosterone boosters" containing Vitamin D-3, Niacin, Vitamin B-6, Vitamin B-12, Magnesium, Zinc, Selenium, D-Aspartic Acid, Fenugreed Seed Extract, Oystershell, [place] Extract, [place] Ginseng may be helpful as well.

## 2013-08-25 NOTE — Assessment & Plan Note (Signed)
labs

## 2013-08-25 NOTE — Assessment & Plan Note (Signed)
Continue with current prescription therapy as reflected on the Med list.  

## 2013-08-25 NOTE — Progress Notes (Signed)
Subjective:    HPI   The patient is here to follow up on chronic LBP, GERD, hypogonadism controlled with medicines, diet and exercise. He is retired. Working part time C/o snoring, dry mouth  Wt Readings from Last 3 Encounters:  08/19/12 196 lb (88.905 kg)  04/24/12 196 lb (88.905 kg)  04/02/12 196 lb (88.905 kg)   BP Readings from Last 3 Encounters:  08/19/12 130/90  04/24/12 130/90  04/02/12 174/90       Review of Systems  Constitutional: Positive for fatigue. Negative for appetite change and unexpected weight change.  HENT: Negative for nosebleeds, congestion, sore throat, sneezing, trouble swallowing and neck pain.   Eyes: Negative for itching and visual disturbance.  Respiratory: Negative for cough.   Cardiovascular: Negative for chest pain, palpitations and leg swelling.  Gastrointestinal: Negative for nausea, diarrhea, blood in stool and abdominal distention.  Genitourinary: Negative for frequency and hematuria.  Musculoskeletal: Positive for back pain. Negative for joint swelling and gait problem.  Skin: Negative for rash.  Neurological: Negative for dizziness, tremors, speech difficulty and weakness.  Psychiatric/Behavioral: Positive for disturbed wake/sleep cycle. Negative for suicidal ideas, dysphoric mood and agitation. The patient is not nervous/anxious.        Objective:   Physical Exam  Constitutional: He is oriented to person, place, and time. He appears well-developed.  HENT:  Right Ear: External ear normal.  Left Ear: External ear normal.  Mouth/Throat: Oropharynx is clear and moist.  Eyes: Conjunctivae normal are normal. Pupils are equal, round, and reactive to light.  Neck: Normal range of motion. No JVD present. No thyromegaly present.  Cardiovascular: Normal rate, regular rhythm, normal heart sounds and intact distal pulses.  Exam reveals no gallop and no friction rub.   No murmur heard. Pulmonary/Chest: Effort normal and breath sounds  normal. No respiratory distress. He has no wheezes. He has no rales. He exhibits no tenderness.  Abdominal: Soft. Bowel sounds are normal. He exhibits no distension and no mass. There is no tenderness. There is no rebound and no guarding.  Genitourinary: n/a Musculoskeletal: Normal range of motion. He exhibits tenderness (LS spine). He exhibits no edema.  Lymphadenopathy:    He has no cervical adenopathy.  Neurological: He is alert and oriented to person, place, and time. He has normal reflexes. No cranial nerve deficit. He exhibits normal muscle tone. Coordination normal.  Skin: Skin is warm and dry. No rash noted.  Psychiatric: He has a normal mood and affect. His behavior is normal. Judgment and thought content normal.  AKs on face x 2  Lab Results  Component Value Date   WBC 5.9 08/14/2012   HGB 16.0 08/14/2012   HCT 48.5 08/14/2012   PLT 278.0 08/14/2012   GLUCOSE 98 08/14/2012   CHOL 225* 04/21/2012   TRIG 156.0* 04/21/2012   HDL 44.30 04/21/2012   LDLDIRECT 154.1 04/21/2012   LDLCALC  Value: 133        Total Cholesterol/HDL:CHD Risk Coronary Heart Disease Risk Table                     Men   Women  1/2 Average Risk   3.4   3.3  Average Risk       5.0   4.4  2 X Average Risk   9.6   7.1  3 X Average Risk  23.4   11.0        Use the calculated Patient Ratio above and the CHD Risk Table  to determine the patient's CHD Risk.        ATP III CLASSIFICATION (LDL):  <100     mg/dL   Optimal  100-129  mg/dL   Near or Above                    Optimal  130-159  mg/dL   Borderline  160-189  mg/dL   High  >190     mg/dL   Very High* 04/10/2010   ALT 21 08/14/2012   AST 21 08/14/2012   NA 139 08/14/2012   K 4.7 08/14/2012   CL 105 08/14/2012   CREATININE 1.1 08/14/2012   BUN 15 08/14/2012   CO2 25 08/14/2012   TSH 0.48 04/21/2012   PSA 0.31 04/21/2012   INR 1.24 04/17/2010   HGBA1C 5.5 08/14/2012      Assessment & Plan:

## 2013-09-07 DIAGNOSIS — M545 Low back pain, unspecified: Secondary | ICD-10-CM | POA: Diagnosis not present

## 2013-09-08 ENCOUNTER — Other Ambulatory Visit: Payer: Self-pay | Admitting: Internal Medicine

## 2013-11-20 ENCOUNTER — Ambulatory Visit (INDEPENDENT_AMBULATORY_CARE_PROVIDER_SITE_OTHER): Payer: Medicare Other | Admitting: Internal Medicine

## 2013-11-20 ENCOUNTER — Ambulatory Visit (INDEPENDENT_AMBULATORY_CARE_PROVIDER_SITE_OTHER)
Admission: RE | Admit: 2013-11-20 | Discharge: 2013-11-20 | Disposition: A | Discharge status: Home / Self Care | Payer: Medicare Other | Source: Ambulatory Visit | Attending: Internal Medicine | Admitting: Internal Medicine

## 2013-11-20 ENCOUNTER — Encounter: Payer: Self-pay | Admitting: Internal Medicine

## 2013-11-20 VITALS — BP 120/82 | HR 93 | Temp 98.5°F | Resp 16 | Ht 71.0 in | Wt 200.0 lb

## 2013-11-20 DIAGNOSIS — R059 Cough, unspecified: Secondary | ICD-10-CM

## 2013-11-20 DIAGNOSIS — J189 Pneumonia, unspecified organism: Secondary | ICD-10-CM | POA: Diagnosis not present

## 2013-11-20 DIAGNOSIS — R05 Cough: Secondary | ICD-10-CM

## 2013-11-20 DIAGNOSIS — J9819 Other pulmonary collapse: Secondary | ICD-10-CM | POA: Diagnosis not present

## 2013-11-20 MED ORDER — AZITHROMYCIN 500 MG PO TABS: 500.0000 mg | ORAL_TABLET | Freq: Every day | ORAL | Status: DC

## 2013-11-20 MED ORDER — PROMETHAZINE-DM 6.25-15 MG/5ML PO SYRP
5.0000 mL | ORAL_SOLUTION | Freq: Four times a day (QID) | ORAL | Status: DC | PRN
Start: 2013-11-20 — End: 2013-11-24

## 2013-11-20 NOTE — Progress Notes (Signed)
Pre visit review using our clinic review tool, if applicable. No additional management support is needed unless otherwise documented below in the visit note. 

## 2013-11-20 NOTE — Patient Instructions (Signed)

## 2013-11-20 NOTE — Assessment & Plan Note (Signed)
I will check his CXR to look for PNA, mass, edema 

## 2013-11-20 NOTE — Assessment & Plan Note (Signed)
I will treat the infection with a Zpak and will control the cough with [id number]

## 2013-11-20 NOTE — Progress Notes (Signed)
Subjective:    Patient ID: Cody Frazier, male    DOB: 09-29-1946, 69 y.o.   MRN: [id number]  Cough This is a new problem. Episode onset: 2 days ago. The problem has been unchanged. The cough is non-productive. Associated symptoms include chills and a sore throat. Pertinent negatives include no chest pain, ear congestion, ear pain, fever, headaches, heartburn, hemoptysis, myalgias, nasal congestion, postnasal drip, rash, rhinorrhea, shortness of breath, sweats, weight loss or wheezing. Nothing aggravates the symptoms. He has tried nothing for the symptoms. His past medical history is significant for pneumonia. There is no history of asthma, bronchiectasis, bronchitis, COPD, emphysema or environmental allergies.      Review of Systems  Constitutional: Positive for chills. Negative for fever, weight loss, diaphoresis, activity change, appetite change, fatigue and unexpected weight change.  HENT: Positive for sinus pressure and sore throat. Negative for congestion, ear pain, postnasal drip, rhinorrhea, sneezing, tinnitus, trouble swallowing and voice change.   Eyes: Negative.   Respiratory: Positive for cough. Negative for hemoptysis, shortness of breath and wheezing.   Cardiovascular: Negative.  Negative for chest pain, palpitations and leg swelling.  Gastrointestinal: Negative.  Negative for heartburn, nausea, vomiting, abdominal pain, diarrhea and blood in stool.  Endocrine: Negative.   Genitourinary: Negative.   Musculoskeletal: Negative.  Negative for myalgias.  Skin: Negative.  Negative for rash.  Allergic/Immunologic: Negative.  Negative for environmental allergies.  Neurological: Negative.  Negative for dizziness, tremors, weakness, light-headedness and headaches.  Hematological: Negative.  Negative for adenopathy. Does not bruise/bleed easily.  Psychiatric/Behavioral: Negative.        Objective:   Physical Exam  Vitals reviewed. Constitutional: He is oriented to person,  place, and time. He appears well-developed and well-nourished. No distress.  HENT:  Head: Normocephalic and atraumatic.  Right Ear: Hearing, tympanic membrane, external ear and ear canal normal.  Left Ear: Hearing, tympanic membrane, external ear and ear canal normal.  Nose: No mucosal edema or rhinorrhea. Right sinus exhibits maxillary sinus tenderness. Right sinus exhibits no frontal sinus tenderness. Left sinus exhibits maxillary sinus tenderness. Left sinus exhibits no frontal sinus tenderness.  Mouth/Throat: Oropharynx is clear and moist and mucous membranes are normal. Mucous membranes are not pale, not dry and not cyanotic. No oral lesions. No trismus in the jaw. No uvula swelling. No oropharyngeal exudate, posterior oropharyngeal edema, posterior oropharyngeal erythema or tonsillar abscesses.  Eyes: Conjunctivae are normal. Right eye exhibits no discharge. Left eye exhibits no discharge. No scleral icterus.  Neck: Normal range of motion. Neck supple. No JVD present. No tracheal deviation present. No thyromegaly present.  Cardiovascular: Normal rate, regular rhythm, normal heart sounds and intact distal pulses.  Exam reveals no gallop and no friction rub.   No murmur heard. Pulmonary/Chest: Effort normal and breath sounds normal. No stridor. No respiratory distress. He has no wheezes. He has no rales. He exhibits no tenderness.  Abdominal: Soft. Bowel sounds are normal. He exhibits no distension and no mass. There is no tenderness. There is no rebound and no guarding.  Musculoskeletal: He exhibits no edema and no tenderness.  Lymphadenopathy:    He has no cervical adenopathy.  Neurological: He is oriented to person, place, and time.  Skin: Skin is warm and dry. No rash noted. He is not diaphoretic. No erythema. No pallor.     Lab Results  Component Value Date   WBC 5.8 02/12/2013   HGB 14.8 02/12/2013   HCT 43.5 02/12/2013   PLT 266.0 02/12/2013   GLUCOSE  109* 08/19/2013   CHOL 277*  08/19/2013   TRIG 313.0* 08/19/2013   HDL 44.30 08/19/2013   LDLDIRECT 187.5 08/19/2013   LDLCALC  Value: 133        Total Cholesterol/HDL:CHD Risk Coronary Heart Disease Risk Table                     Men   Women  1/2 Average Risk   3.4   3.3  Average Risk       5.0   4.4  2 X Average Risk   9.6   7.1  3 X Average Risk  23.4   11.0        Use the calculated Patient Ratio above and the CHD Risk Table to determine the patient's CHD Risk.        ATP III CLASSIFICATION (LDL):  <100     mg/dL   Optimal  100-129  mg/dL   Near or Above                    Optimal  130-159  mg/dL   Borderline  160-189  mg/dL   High  >190     mg/dL   Very High* 04/10/2010   ALT 32 08/19/2013   AST 33 08/19/2013   NA 142 08/19/2013   K 4.4 08/19/2013   CL 106 08/19/2013   CREATININE 1.1 08/19/2013   BUN 17 08/19/2013   CO2 25 08/19/2013   TSH 0.48 04/21/2012   PSA 0.31 04/21/2012   INR 1.24 04/17/2010   HGBA1C 5.5 08/14/2012       Assessment & Plan:

## 2013-11-24 ENCOUNTER — Encounter: Payer: Self-pay | Admitting: Internal Medicine

## 2013-11-24 ENCOUNTER — Ambulatory Visit (INDEPENDENT_AMBULATORY_CARE_PROVIDER_SITE_OTHER): Payer: Medicare Other | Admitting: Internal Medicine

## 2013-11-24 VITALS — BP 130/78 | HR 80 | Temp 97.9°F | Resp 16 | Wt 199.0 lb

## 2013-11-24 DIAGNOSIS — M545 Low back pain, unspecified: Secondary | ICD-10-CM

## 2013-11-24 DIAGNOSIS — I10 Essential (primary) hypertension: Secondary | ICD-10-CM | POA: Diagnosis not present

## 2013-11-24 DIAGNOSIS — J309 Allergic rhinitis, unspecified: Secondary | ICD-10-CM

## 2013-11-24 DIAGNOSIS — N400 Enlarged prostate without lower urinary tract symptoms: Secondary | ICD-10-CM | POA: Diagnosis not present

## 2013-11-24 DIAGNOSIS — E785 Hyperlipidemia, unspecified: Secondary | ICD-10-CM

## 2013-11-24 DIAGNOSIS — D509 Iron deficiency anemia, unspecified: Secondary | ICD-10-CM

## 2013-11-24 MED ORDER — OXYCODONE HCL 15 MG PO TABS: 15.0000 mg | ORAL_TABLET | Freq: Four times a day (QID) | ORAL | Status: DC | PRN

## 2013-11-24 NOTE — Assessment & Plan Note (Signed)
Continue with current prescription therapy as reflected on the Med list.  

## 2013-11-24 NOTE — Progress Notes (Signed)
Subjective:    HPI   The patient is here to follow up on chronic LBP, GERD, hypogonadism controlled with medicines, diet and exercise. He is retired. Working part time F/u snoring, dry mouth  Wt Readings from Last 3 Encounters:  08/19/12 196 lb (88.905 kg)  04/24/12 196 lb (88.905 kg)  04/02/12 196 lb (88.905 kg)   BP Readings from Last 3 Encounters:  08/19/12 130/90  04/24/12 130/90  04/02/12 174/90       Review of Systems  Constitutional: Positive for fatigue. Negative for appetite change and unexpected weight change.  HENT: Negative for nosebleeds, congestion, sore throat, sneezing, trouble swallowing and neck pain.   Eyes: Negative for itching and visual disturbance.  Respiratory: Negative for cough.   Cardiovascular: Negative for chest pain, palpitations and leg swelling.  Gastrointestinal: Negative for nausea, diarrhea, blood in stool and abdominal distention.  Genitourinary: Negative for frequency and hematuria.  Musculoskeletal: Positive for back pain. Negative for joint swelling and gait problem.  Skin: Negative for rash.  Neurological: Negative for dizziness, tremors, speech difficulty and weakness.  Psychiatric/Behavioral: Positive for disturbed wake/sleep cycle. Negative for suicidal ideas, dysphoric mood and agitation. The patient is not nervous/anxious.        Objective:   Physical Exam  Constitutional: He is oriented to person, place, and time. He appears well-developed.  HENT:  Right Ear: External ear normal.  Left Ear: External ear normal.  Mouth/Throat: Oropharynx is clear and moist.  Eyes: Conjunctivae normal are normal. Pupils are equal, round, and reactive to light.  Neck: Normal range of motion. No JVD present. No thyromegaly present.  Cardiovascular: Normal rate, regular rhythm, normal heart sounds and intact distal pulses.  Exam reveals no gallop and no friction rub.   No murmur heard. Pulmonary/Chest: Effort normal and breath sounds  normal. No respiratory distress. He has no wheezes. He has no rales. He exhibits no tenderness.  Abdominal: Soft. Bowel sounds are normal. He exhibits no distension and no mass. There is no tenderness. There is no rebound and no guarding.  Genitourinary: n/a Musculoskeletal: Normal range of motion. He exhibits tenderness (LS spine). He exhibits no edema.  Lymphadenopathy:    He has no cervical adenopathy.  Neurological: He is alert and oriented to person, place, and time. He has normal reflexes. No cranial nerve deficit. He exhibits normal muscle tone. Coordination normal.  Skin: Skin is warm and dry. No rash noted.  Psychiatric: He has a normal mood and affect. His behavior is normal. Judgment and thought content normal.  AKs on face x 2  Lab Results  Component Value Date   WBC 5.9 08/14/2012   HGB 16.0 08/14/2012   HCT 48.5 08/14/2012   PLT 278.0 08/14/2012   GLUCOSE 98 08/14/2012   CHOL 225* 04/21/2012   TRIG 156.0* 04/21/2012   HDL 44.30 04/21/2012   LDLDIRECT 154.1 04/21/2012   LDLCALC  Value: 133        Total Cholesterol/HDL:CHD Risk Coronary Heart Disease Risk Table                     Men   Women  1/2 Average Risk   3.4   3.3  Average Risk       5.0   4.4  2 X Average Risk   9.6   7.1  3 X Average Risk  23.4   11.0        Use the calculated Patient Ratio above and the CHD Risk Table  to determine the patient's CHD Risk.        ATP III CLASSIFICATION (LDL):  <100     mg/dL   Optimal  100-129  mg/dL   Near or Above                    Optimal  130-159  mg/dL   Borderline  160-189  mg/dL   High  >190     mg/dL   Very High* 04/10/2010   ALT 21 08/14/2012   AST 21 08/14/2012   NA 139 08/14/2012   K 4.7 08/14/2012   CL 105 08/14/2012   CREATININE 1.1 08/14/2012   BUN 15 08/14/2012   CO2 25 08/14/2012   TSH 0.48 04/21/2012   PSA 0.31 04/21/2012   INR 1.24 04/17/2010   HGBA1C 5.5 08/14/2012      Assessment & Plan:

## 2013-11-24 NOTE — Patient Instructions (Signed)
There are natural ways to boost your testosterone:  1. Lose Weight If you're overweight, shedding the excess pounds may increase your testosterone levels, according to multiple research. Overweight men are more likely to have low testosterone levels to begin with, so this is an important trick to increase your body's testosterone production when you need it most.   2. Strength Training    Strength training is also known to boost testosterone levels, provided you are doing so intensely enough. When strength training to boost testosterone, you'll want to increase the weight and lower your number of reps, and then focus on exercises that work a large number of muscles.  3. Optimize Your Vitamin D Levels Vitamin D, a steroid hormone, is essential for the healthy development of the nucleus of the sperm cell, and helps maintain semen quality and sperm count. Vitamin D also increases levels of testosterone, which may boost libido. In one study, overweight men who were given vitamin D supplements had a significant increase in testosterone levels after one year.  4. Reduce Stress When you're under a lot of stress, your body releases high levels of the stress hormone cortisol. This hormone actually blocks the effects of testosterone, presumably because, from a biological standpoint, testosterone-associated behaviors (mating, competing, aggression) may have lowered your chances of survival in an emergency (hence, the "fight or flight" response is dominant, courtesy of cortisol).  5. Limit or Eliminate Sugar from Your Diet Testosterone levels decrease after you eat sugar, which is likely because the sugar leads to a high insulin level, another factor leading to low testosterone.  6. Eat Healthy Fats By healthy, this means not only mon- and polyunsaturated fats, like that found in avocadoes and nuts, but also saturated, as these are essential for building testosterone. Research shows that a diet with less than  40 percent of energy as fat (and that mainly from animal sources, i.e. saturated) lead to a decrease in testosterone levels.  It's important to understand that your body requires saturated fats from animal and vegetable sources (such as meat, dairy, certain oils, and tropical plants like coconut) for optimal functioning, and if you neglect this important food group in favor of sugar, grains and other starchy carbs, your health and weight are almost guaranteed to suffer. Examples of healthy fats you can eat more of to give your testosterone levels a boost include:  Olives and Olive oil  Coconuts and coconut oil Butter made from organic milk  Raw nuts, such as, almonds or pecans Eggs Avocados   Meats Palm oil Unheated organic nut oils   7. "Testosterone boosters" containing Vitamin D-3, Niacin, Vitamin B-6, Vitamin B-12, Magnesium, Zinc, Selenium, D-Aspartic Acid, Fenugreed Seed Extract, Oystershell, [place] Extract, Siberian Ginseng may be helpful as well.   

## 2013-11-24 NOTE — Progress Notes (Signed)
Pre visit review using our clinic review tool, if applicable. No additional management support is needed unless otherwise documented below in the visit note. 

## 2014-01-09 ENCOUNTER — Other Ambulatory Visit: Payer: Self-pay | Admitting: Internal Medicine

## 2014-01-14 ENCOUNTER — Other Ambulatory Visit: Payer: Self-pay | Admitting: Internal Medicine

## 2014-02-11 ENCOUNTER — Other Ambulatory Visit: Payer: Self-pay | Admitting: Internal Medicine

## 2014-02-22 ENCOUNTER — Encounter: Payer: Self-pay | Admitting: Internal Medicine

## 2014-02-22 ENCOUNTER — Ambulatory Visit (INDEPENDENT_AMBULATORY_CARE_PROVIDER_SITE_OTHER): Payer: Medicare Other | Admitting: Internal Medicine

## 2014-02-22 VITALS — BP 130/92 | HR 72 | Temp 98.8°F | Resp 16 | Wt 206.0 lb

## 2014-02-22 DIAGNOSIS — H6120 Impacted cerumen, unspecified ear: Secondary | ICD-10-CM | POA: Diagnosis not present

## 2014-02-22 DIAGNOSIS — L309 Dermatitis, unspecified: Secondary | ICD-10-CM | POA: Insufficient documentation

## 2014-02-22 DIAGNOSIS — M545 Low back pain, unspecified: Secondary | ICD-10-CM

## 2014-02-22 DIAGNOSIS — L259 Unspecified contact dermatitis, unspecified cause: Secondary | ICD-10-CM | POA: Diagnosis not present

## 2014-02-22 DIAGNOSIS — I10 Essential (primary) hypertension: Secondary | ICD-10-CM

## 2014-02-22 MED ORDER — OXYCODONE HCL 15 MG PO TABS: 15.0000 mg | ORAL_TABLET | Freq: Four times a day (QID) | ORAL | Status: DC | PRN

## 2014-02-22 MED ORDER — TRIAMCINOLONE ACETONIDE 0.5 % EX CREA: 1.0000 "application " | TOPICAL_CREAM | Freq: Three times a day (TID) | CUTANEOUS | Status: DC

## 2014-02-22 NOTE — Assessment & Plan Note (Signed)
4/15 B ears Will irrigate

## 2014-02-22 NOTE — Assessment & Plan Note (Signed)
4/15 B shins Triamc cream

## 2014-02-22 NOTE — Progress Notes (Signed)
Subjective:    HPI   The patient is here to follow up on chronic LBP, GERD, hypogonadism controlled with medicines, diet and exercise. He is retired.    Working part time F/u snoring, dry mouth  Wt Readings from Last 3 Encounters:  08/19/12 196 lb (88.905 kg)  04/24/12 196 lb (88.905 kg)  04/02/12 196 lb (88.905 kg)   BP Readings from Last 3 Encounters:  08/19/12 130/90  04/24/12 130/90  04/02/12 174/90       Review of Systems  Constitutional: Positive for fatigue. Negative for appetite change and unexpected weight change.  HENT: Negative for nosebleeds, congestion, sore throat, sneezing, trouble swallowing and neck pain.   Eyes: Negative for itching and visual disturbance.  Respiratory: Negative for cough.   Cardiovascular: Negative for chest pain, palpitations and leg swelling.  Gastrointestinal: Negative for nausea, diarrhea, blood in stool and abdominal distention.  Genitourinary: Negative for frequency and hematuria.  Musculoskeletal: Positive for back pain. Negative for joint swelling and gait problem.  Skin: Negative for rash.  Neurological: Negative for dizziness, tremors, speech difficulty and weakness.  Psychiatric/Behavioral: Positive for disturbed wake/sleep cycle. Negative for suicidal ideas, dysphoric mood and agitation. The patient is not nervous/anxious.        Objective:   Physical Exam  Constitutional: He is oriented to person, place, and time. He appears well-developed.  HENT:  Right Ear: External ear normal.  Left Ear: External ear normal.  Mouth/Throat: Oropharynx is clear and moist.  Eyes: Conjunctivae normal are normal. Pupils are equal, round, and reactive to light.  Neck: Normal range of motion. No JVD present. No thyromegaly present.  Cardiovascular: Normal rate, regular rhythm, normal heart sounds and intact distal pulses.  Exam reveals no gallop and no friction rub.   No murmur heard. Pulmonary/Chest: Effort normal and breath sounds  normal. No respiratory distress. He has no wheezes. He has no rales. He exhibits no tenderness.  Abdominal: Soft. Bowel sounds are normal. He exhibits no distension and no mass. There is no tenderness. There is no rebound and no guarding.  Genitourinary: n/a Musculoskeletal: Normal range of motion. He exhibits tenderness (LS spine). He exhibits no edema.  Lymphadenopathy:    He has no cervical adenopathy.  Neurological: He is alert and oriented to person, place, and time. He has normal reflexes. No cranial nerve deficit. He exhibits normal muscle tone. Coordination normal.  Skin: Skin is warm and dry. No rash noted.  Psychiatric: He has a normal mood and affect. His behavior is normal. Judgment and thought content normal.  Wax B  Lab Results  Component Value Date   WBC 5.9 08/14/2012   HGB 16.0 08/14/2012   HCT 48.5 08/14/2012   PLT 278.0 08/14/2012   GLUCOSE 98 08/14/2012   CHOL 225* 04/21/2012   TRIG 156.0* 04/21/2012   HDL 44.30 04/21/2012   LDLDIRECT 154.1 04/21/2012   LDLCALC  Value: 133        Total Cholesterol/HDL:CHD Risk Coronary Heart Disease Risk Table                     Men   Women  1/2 Average Risk   3.4   3.3  Average Risk       5.0   4.4  2 X Average Risk   9.6   7.1  3 X Average Risk  23.4   11.0        Use the calculated Patient Ratio above and the CHD Risk Table  to determine the patient's CHD Risk.        ATP III CLASSIFICATION (LDL):  <100     mg/dL   Optimal  100-129  mg/dL   Near or Above                    Optimal  130-159  mg/dL   Borderline  160-189  mg/dL   High  >190     mg/dL   Very High* 04/10/2010   ALT 21 08/14/2012   AST 21 08/14/2012   NA 139 08/14/2012   K 4.7 08/14/2012   CL 105 08/14/2012   CREATININE 1.1 08/14/2012   BUN 15 08/14/2012   CO2 25 08/14/2012   TSH 0.48 04/21/2012   PSA 0.31 04/21/2012   INR 1.24 04/17/2010   HGBA1C 5.5 08/14/2012      Procedure Note :     Procedure :  Ear irrigation   Indication:  Cerumen impaction B   Risks, including pain,  dizziness, eardrum perforation, bleeding, infection and others as well as benefits were explained to the patient in detail. Verbal consent was obtained and the patient agreed to proceed.    We used "The Elephant Ear Irrigation Device" filled with lukewarm water for irrigation. A large amount wax was recovered. Procedure has also required manual wax removal with an ear loop.   Tolerated well. Complications: None.   Postprocedure instructions :  Call if problems.   Assessment & Plan:

## 2014-02-22 NOTE — Progress Notes (Signed)
Pre visit review using our clinic review tool, if applicable. No additional management support is needed unless otherwise documented below in the visit note. 

## 2014-02-22 NOTE — Assessment & Plan Note (Signed)
Continue with current prescription therapy as reflected on the Med list.  

## 2014-02-24 ENCOUNTER — Other Ambulatory Visit: Payer: Self-pay | Admitting: Internal Medicine

## 2014-03-10 ENCOUNTER — Other Ambulatory Visit: Payer: Self-pay | Admitting: Internal Medicine

## 2014-05-24 ENCOUNTER — Ambulatory Visit (INDEPENDENT_AMBULATORY_CARE_PROVIDER_SITE_OTHER): Payer: Medicare Other | Admitting: Internal Medicine

## 2014-05-24 ENCOUNTER — Encounter: Payer: Self-pay | Admitting: Internal Medicine

## 2014-05-24 VITALS — BP 140/88 | HR 80 | Temp 98.2°F | Resp 16 | Wt 206.0 lb

## 2014-05-24 DIAGNOSIS — M5430 Sciatica, unspecified side: Secondary | ICD-10-CM | POA: Diagnosis not present

## 2014-05-24 DIAGNOSIS — Z0000 Encounter for general adult medical examination without abnormal findings: Secondary | ICD-10-CM | POA: Diagnosis not present

## 2014-05-24 DIAGNOSIS — T753XXA Motion sickness, initial encounter: Secondary | ICD-10-CM

## 2014-05-24 DIAGNOSIS — M5442 Lumbago with sciatica, left side: Secondary | ICD-10-CM

## 2014-05-24 DIAGNOSIS — I10 Essential (primary) hypertension: Secondary | ICD-10-CM

## 2014-05-24 DIAGNOSIS — E785 Hyperlipidemia, unspecified: Secondary | ICD-10-CM

## 2014-05-24 DIAGNOSIS — N320 Bladder-neck obstruction: Secondary | ICD-10-CM

## 2014-05-24 DIAGNOSIS — M5441 Lumbago with sciatica, right side: Secondary | ICD-10-CM

## 2014-05-24 MED ORDER — OXYCODONE HCL 15 MG PO TABS: 15.0000 mg | ORAL_TABLET | Freq: Four times a day (QID) | ORAL | Status: DC | PRN

## 2014-05-24 MED ORDER — SCOPOLAMINE 1 MG/3DAYS TD PT72: 1.0000 | MEDICATED_PATCH | TRANSDERMAL | Status: DC

## 2014-05-24 NOTE — Assessment & Plan Note (Signed)
Continue with current prescription therapy as reflected on the Med list.  

## 2014-05-24 NOTE — Progress Notes (Signed)
Subjective:    HPI  Going on a cruise - needs a patch The patient is here to follow up on chronic LBP, GERD, hypogonadism controlled with medicines, diet and exercise. He is retired.    Working part time F/u snoring, dry mouth  Wt Readings from Last 3 Encounters:  08/19/12 196 lb (88.905 kg)  04/24/12 196 lb (88.905 kg)  04/02/12 196 lb (88.905 kg)   BP Readings from Last 3 Encounters:  08/19/12 130/90  04/24/12 130/90  04/02/12 174/90       Review of Systems  Constitutional: Positive for fatigue. Negative for appetite change and unexpected weight change.  HENT: Negative for nosebleeds, congestion, sore throat, sneezing, trouble swallowing and neck pain.   Eyes: Negative for itching and visual disturbance.  Respiratory: Negative for cough.   Cardiovascular: Negative for chest pain, palpitations and leg swelling.  Gastrointestinal: Negative for nausea, diarrhea, blood in stool and abdominal distention.  Genitourinary: Negative for frequency and hematuria.  Musculoskeletal: Positive for back pain. Negative for joint swelling and gait problem.  Skin: Negative for rash.  Neurological: Negative for dizziness, tremors, speech difficulty and weakness.  Psychiatric/Behavioral: Positive for disturbed wake/sleep cycle. Negative for suicidal ideas, dysphoric mood and agitation. The patient is not nervous/anxious.        Objective:   Physical Exam  Constitutional: He is oriented to person, place, and time. He appears well-developed.  HENT:  Right Ear: External ear normal.  Left Ear: External ear normal.  Mouth/Throat: Oropharynx is clear and moist.  Eyes: Conjunctivae normal are normal. Pupils are equal, round, and reactive to light.  Neck: Normal range of motion. No JVD present. No thyromegaly present.  Cardiovascular: Normal rate, regular rhythm, normal heart sounds and intact distal pulses.  Exam reveals no gallop and no friction rub.   No murmur heard. Pulmonary/Chest:  Effort normal and breath sounds normal. No respiratory distress. He has no wheezes. He has no rales. He exhibits no tenderness.  Abdominal: Soft. Bowel sounds are normal. He exhibits no distension and no mass. There is no tenderness. There is no rebound and no guarding.  Genitourinary: n/a Musculoskeletal: Normal range of motion. He exhibits tenderness (LS spine). He exhibits no edema.  Lymphadenopathy:    He has no cervical adenopathy.  Neurological: He is alert and oriented to person, place, and time. He has normal reflexes. No cranial nerve deficit. He exhibits normal muscle tone. Coordination normal.  Skin: Skin is warm and dry. No rash noted.  Psychiatric: He has a normal mood and affect. His behavior is normal. Judgment and thought content normal.  Wax B  Lab Results  Component Value Date   WBC 5.9 08/14/2012   HGB 16.0 08/14/2012   HCT 48.5 08/14/2012   PLT 278.0 08/14/2012   GLUCOSE 98 08/14/2012   CHOL 225* 04/21/2012   TRIG 156.0* 04/21/2012   HDL 44.30 04/21/2012   LDLDIRECT 154.1 04/21/2012   LDLCALC  Value: 133        Total Cholesterol/HDL:CHD Risk Coronary Heart Disease Risk Table                     Men   Women  1/2 Average Risk   3.4   3.3  Average Risk       5.0   4.4  2 X Average Risk   9.6   7.1  3 X Average Risk  23.4   11.0        Use the calculated Patient  Ratio above and the CHD Risk Table to determine the patient's CHD Risk.        ATP III CLASSIFICATION (LDL):  <100     mg/dL   Optimal  100-129  mg/dL   Near or Above                    Optimal  130-159  mg/dL   Borderline  160-189  mg/dL   High  >190     mg/dL   Very High* 04/10/2010   ALT 21 08/14/2012   AST 21 08/14/2012   NA 139 08/14/2012   K 4.7 08/14/2012   CL 105 08/14/2012   CREATININE 1.1 08/14/2012   BUN 15 08/14/2012   CO2 25 08/14/2012   TSH 0.48 04/21/2012   PSA 0.31 04/21/2012   INR 1.24 04/17/2010   HGBA1C 5.5 08/14/2012         Assessment & Plan:

## 2014-05-24 NOTE — Assessment & Plan Note (Signed)
Scopolamine patch Rx  Potential benefits of a short term scopolamine use as well as potential risks  and complications were explained to the patient and were aknowledged.

## 2014-05-24 NOTE — Progress Notes (Signed)
Pre visit review using our clinic review tool, if applicable. No additional management support is needed unless otherwise documented below in the visit note. 

## 2014-05-25 ENCOUNTER — Telehealth: Payer: Self-pay | Admitting: Internal Medicine

## 2014-05-25 NOTE — Telephone Encounter (Signed)
Relevant patient education mailed to patient.  

## 2014-08-19 ENCOUNTER — Other Ambulatory Visit (INDEPENDENT_AMBULATORY_CARE_PROVIDER_SITE_OTHER): Payer: Medicare Other

## 2014-08-19 DIAGNOSIS — E785 Hyperlipidemia, unspecified: Secondary | ICD-10-CM

## 2014-08-19 DIAGNOSIS — M5442 Lumbago with sciatica, left side: Secondary | ICD-10-CM

## 2014-08-19 DIAGNOSIS — Z0000 Encounter for general adult medical examination without abnormal findings: Secondary | ICD-10-CM | POA: Diagnosis not present

## 2014-08-19 DIAGNOSIS — M5441 Lumbago with sciatica, right side: Secondary | ICD-10-CM

## 2014-08-19 DIAGNOSIS — N320 Bladder-neck obstruction: Secondary | ICD-10-CM | POA: Diagnosis not present

## 2014-08-19 DIAGNOSIS — I10 Essential (primary) hypertension: Secondary | ICD-10-CM | POA: Diagnosis not present

## 2014-08-19 LAB — URINALYSIS
Bilirubin Urine: NEGATIVE
Ketones, ur: NEGATIVE
Leukocytes, UA: NEGATIVE
Nitrite: NEGATIVE
Specific Gravity, Urine: 1.01 (ref 1.000–1.030)
Total Protein, Urine: NEGATIVE
Urine Glucose: NEGATIVE
Urobilinogen, UA: 0.2 (ref 0.0–1.0)
pH: 6 (ref 5.0–8.0)

## 2014-08-19 LAB — CBC WITH DIFFERENTIAL/PLATELET
Basophils Absolute: 0 10*3/uL (ref 0.0–0.1)
Basophils Relative: 0.3 % (ref 0.0–3.0)
Eosinophils Absolute: 0.2 10*3/uL (ref 0.0–0.7)
Eosinophils Relative: 2.5 % (ref 0.0–5.0)
HCT: 42.9 % (ref 39.0–52.0)
Hemoglobin: 14.3 g/dL (ref 13.0–17.0)
Lymphocytes Relative: 21.3 % (ref 12.0–46.0)
Lymphs Abs: 1.4 10*3/uL (ref 0.7–4.0)
MCHC: 33.3 g/dL (ref 30.0–36.0)
MCV: 91.8 fl (ref 78.0–100.0)
Monocytes Absolute: 0.5 10*3/uL (ref 0.1–1.0)
Monocytes Relative: 7.2 % (ref 3.0–12.0)
Neutro Abs: 4.5 10*3/uL (ref 1.4–7.7)
Neutrophils Relative %: 68.7 % (ref 43.0–77.0)
Platelets: 311 10*3/uL (ref 150.0–400.0)
RBC: 4.67 Mil/uL (ref 4.22–5.81)
RDW: 12.9 % (ref 11.5–15.5)
WBC: 6.5 10*3/uL (ref 4.0–10.5)

## 2014-08-19 LAB — LIPID PANEL
Cholesterol: 234 mg/dL — ABNORMAL HIGH (ref 0–200)
HDL: 37.1 mg/dL — ABNORMAL LOW (ref >39.00)
LDL Cholesterol: 157 mg/dL — ABNORMAL HIGH (ref 0–99)
NonHDL: 196.9
Total CHOL/HDL Ratio: 6
Triglycerides: 199 mg/dL — ABNORMAL HIGH (ref 0.0–149.0)
VLDL: 39.8 mg/dL (ref 0.0–40.0)

## 2014-08-19 LAB — HEPATIC FUNCTION PANEL
ALT: 23 U/L (ref 0–53)
AST: 23 U/L (ref 0–37)
Albumin: 3.4 g/dL — ABNORMAL LOW (ref 3.5–5.2)
Alkaline Phosphatase: 113 U/L (ref 39–117)
Bilirubin, Direct: 0.1 mg/dL (ref 0.0–0.3)
Total Bilirubin: 0.4 mg/dL (ref 0.2–1.2)
Total Protein: 7.2 g/dL (ref 6.0–8.3)

## 2014-08-19 LAB — BASIC METABOLIC PANEL
BUN: 21 mg/dL (ref 6–23)
CO2: 27 mEq/L (ref 19–32)
Calcium: 10.2 mg/dL (ref 8.4–10.5)
Chloride: 105 mEq/L (ref 96–112)
Creatinine, Ser: 1.1 mg/dL (ref 0.4–1.5)
GFR: 74.45 mL/min (ref >60.00)
Glucose, Bld: 96 mg/dL (ref 70–99)
Potassium: 5.1 mEq/L (ref 3.5–5.1)
Sodium: 141 mEq/L (ref 135–145)

## 2014-08-19 LAB — TSH: TSH: 0.41 u[IU]/mL (ref 0.35–4.50)

## 2014-08-19 LAB — PSA: PSA: 0.16 ng/mL (ref 0.10–4.00)

## 2014-08-21 ENCOUNTER — Other Ambulatory Visit: Payer: Self-pay | Admitting: Internal Medicine

## 2014-08-25 ENCOUNTER — Encounter: Payer: Self-pay | Admitting: Internal Medicine

## 2014-08-25 ENCOUNTER — Ambulatory Visit (INDEPENDENT_AMBULATORY_CARE_PROVIDER_SITE_OTHER): Payer: Medicare Other | Admitting: Internal Medicine

## 2014-08-25 VITALS — BP 130/90 | HR 78 | Temp 98.5°F | Wt 205.0 lb

## 2014-08-25 DIAGNOSIS — E291 Testicular hypofunction: Secondary | ICD-10-CM

## 2014-08-25 DIAGNOSIS — Z0000 Encounter for general adult medical examination without abnormal findings: Secondary | ICD-10-CM

## 2014-08-25 DIAGNOSIS — K2100 Gastro-esophageal reflux disease with esophagitis, without bleeding: Secondary | ICD-10-CM

## 2014-08-25 DIAGNOSIS — M5440 Lumbago with sciatica, unspecified side: Secondary | ICD-10-CM | POA: Diagnosis not present

## 2014-08-25 DIAGNOSIS — D509 Iron deficiency anemia, unspecified: Secondary | ICD-10-CM

## 2014-08-25 DIAGNOSIS — Z23 Encounter for immunization: Secondary | ICD-10-CM

## 2014-08-25 DIAGNOSIS — I10 Essential (primary) hypertension: Secondary | ICD-10-CM

## 2014-08-25 MED ORDER — OXYCODONE HCL 15 MG PO TABS: 15.0000 mg | ORAL_TABLET | Freq: Four times a day (QID) | ORAL | Status: DC | PRN

## 2014-08-25 MED ORDER — TRIAMCINOLONE ACETONIDE 0.5 % EX CREA: 1.0000 "application " | TOPICAL_CREAM | Freq: Three times a day (TID) | CUTANEOUS | Status: DC

## 2014-08-25 NOTE — Progress Notes (Signed)
Pre visit review using our clinic review tool, if applicable. No additional management support is needed unless otherwise documented below in the visit note. 

## 2014-08-25 NOTE — Progress Notes (Signed)
Subjective:    HPI  The patient is here for a wellness exam.   The patient needs to address  chronic hypertension that has been well controlled with medicines; to address chronic  hyperlipidemia controlled with medicines as well; and to address type 2 chronic diabetes, controlled with medical treatment and diet.  The patient is here to follow up on chronic LBP, GERD, hypogonadism controlled with medicines, diet and exercise. He is retired.  Working part time F/u snoring, dry mouth  Wt Readings from Last 3 Encounters:  08/19/12 196 lb (88.905 kg)  04/24/12 196 lb (88.905 kg)  04/02/12 196 lb (88.905 kg)   BP Readings from Last 3 Encounters:  08/19/12 130/90  04/24/12 130/90  04/02/12 174/90       Review of Systems  Constitutional: Positive for fatigue. Negative for appetite change and unexpected weight change.  HENT: Negative for nosebleeds, congestion, sore throat, sneezing, trouble swallowing and neck pain.   Eyes: Negative for itching and visual disturbance.  Respiratory: Negative for cough.   Cardiovascular: Negative for chest pain, palpitations and leg swelling.  Gastrointestinal: Negative for nausea, diarrhea, blood in stool and abdominal distention.  Genitourinary: Negative for frequency and hematuria.  Musculoskeletal: Positive for back pain. Negative for joint swelling and gait problem.  Skin: Negative for rash.  Neurological: Negative for dizziness, tremors, speech difficulty and weakness.  Psychiatric/Behavioral: Positive for disturbed wake/sleep cycle. Negative for suicidal ideas, dysphoric mood and agitation. The patient is not nervous/anxious.        Objective:   Physical Exam  Constitutional: He is oriented to person, place, and time. He appears well-developed.  HENT:  Right Ear: External ear normal.  Left Ear: External ear normal.  Mouth/Throat: Oropharynx is clear and moist.  Eyes: Conjunctivae normal are normal. Pupils are equal, round, and  reactive to light.  Neck: Normal range of motion. No JVD present. No thyromegaly present.  Cardiovascular: Normal rate, regular rhythm, normal heart sounds and intact distal pulses.  Exam reveals no gallop and no friction rub.   No murmur heard. Pulmonary/Chest: Effort normal and breath sounds normal. No respiratory distress. He has no wheezes. He has no rales. He exhibits no tenderness.  Abdominal: Soft. Bowel sounds are normal. He exhibits no distension and no mass. There is no tenderness. There is no rebound and no guarding.  Genitourinary: n/a Musculoskeletal: Normal range of motion. He exhibits tenderness (LS spine). He exhibits no edema.  Lymphadenopathy:    He has no cervical adenopathy.  Neurological: He is alert and oriented to person, place, and time. He has normal reflexes. No cranial nerve deficit. He exhibits normal muscle tone. Coordination normal.  Skin: Skin is warm and dry. No rash noted.  Psychiatric: He has a normal mood and affect. His behavior is normal. Judgment and thought content normal.  Wax B  Lab Results  Component Value Date   WBC 5.9 08/14/2012   HGB 16.0 08/14/2012   HCT 48.5 08/14/2012   PLT 278.0 08/14/2012   GLUCOSE 98 08/14/2012   CHOL 225* 04/21/2012   TRIG 156.0* 04/21/2012   HDL 44.30 04/21/2012   LDLDIRECT 154.1 04/21/2012   LDLCALC  Value: 133        Total Cholesterol/HDL:CHD Risk Coronary Heart Disease Risk Table                     Men   Women  1/2 Average Risk   3.4   3.3  Average Risk  5.0   4.4  2 X Average Risk   9.6   7.1  3 X Average Risk  23.4   11.0        Use the calculated Patient Ratio above and the CHD Risk Table to determine the patient's CHD Risk.        ATP III CLASSIFICATION (LDL):  <100     mg/dL   Optimal  100-129  mg/dL   Near or Above                    Optimal  130-159  mg/dL   Borderline  160-189  mg/dL   High  >190     mg/dL   Very High* 04/10/2010   ALT 21 08/14/2012   AST 21 08/14/2012   NA 139 08/14/2012   K 4.7 08/14/2012   CL  105 08/14/2012   CREATININE 1.1 08/14/2012   BUN 15 08/14/2012   CO2 25 08/14/2012   TSH 0.48 04/21/2012   PSA 0.31 04/21/2012   INR 1.24 04/17/2010   HGBA1C 5.5 08/14/2012         Assessment & Plan:

## 2014-08-25 NOTE — Assessment & Plan Note (Signed)
Continue with current prescription therapy as reflected on the Med list.  

## 2014-08-25 NOTE — Assessment & Plan Note (Signed)
Chronic   Potential benefits of a long term opioids use as well as potential risks (i.e. addiction risk, apnea etc) and complications (i.e. Somnolence, constipation and others) were explained to the patient and were aknowledged. 

## 2014-08-25 NOTE — Assessment & Plan Note (Signed)
Remote GI bleed - doing well

## 2014-08-25 NOTE — Patient Instructions (Signed)

## 2014-08-25 NOTE — Assessment & Plan Note (Signed)

## 2014-10-04 DIAGNOSIS — I10 Essential (primary) hypertension: Secondary | ICD-10-CM | POA: Diagnosis not present

## 2014-10-04 DIAGNOSIS — M545 Low back pain: Secondary | ICD-10-CM | POA: Diagnosis not present

## 2014-10-04 DIAGNOSIS — Z6829 Body mass index (BMI) 29.0-29.9, adult: Secondary | ICD-10-CM | POA: Diagnosis not present

## 2014-10-15 ENCOUNTER — Other Ambulatory Visit: Payer: Self-pay | Admitting: *Deleted

## 2014-10-15 MED ORDER — PANTOPRAZOLE SODIUM 40 MG PO TBEC: 40.0000 mg | DELAYED_RELEASE_TABLET | Freq: Two times a day (BID) | ORAL | Status: DC

## 2014-10-20 ENCOUNTER — Telehealth: Payer: Self-pay

## 2014-10-20 ENCOUNTER — Ambulatory Visit: Payer: Medicare Other | Admitting: Internal Medicine

## 2014-10-20 NOTE — Telephone Encounter (Signed)
See note from [facility]

## 2014-10-20 NOTE — Telephone Encounter (Signed)
Patient not seen here at [facility] since 2013.  Recent appt with Dr. Alain Marion.  GI will not be able to do prior auth

## 2014-10-20 NOTE — Telephone Encounter (Signed)
PCP stated that the request to PA for Pantoprazole will need to go to GI  PA: Pantoprazole SOD DR 40mg  TAB sig: 1 tab po bid

## 2014-10-21 NOTE — Telephone Encounter (Signed)
The question is whether he can be switched to a qd regimen, as his insurance wants, instead of the bid. We will do the PA. Thx

## 2014-10-25 ENCOUNTER — Other Ambulatory Visit: Payer: Self-pay | Admitting: Neurosurgery

## 2014-10-25 DIAGNOSIS — M545 Low back pain: Secondary | ICD-10-CM

## 2014-10-29 ENCOUNTER — Ambulatory Visit
Admission: RE | Admit: 2014-10-29 | Discharge: 2014-10-29 | Disposition: A | Discharge status: Home / Self Care | Payer: Medicare Other | Source: Ambulatory Visit | Attending: Neurosurgery | Admitting: Neurosurgery

## 2014-10-29 DIAGNOSIS — M545 Low back pain: Secondary | ICD-10-CM

## 2014-10-29 MED ORDER — METHYLPREDNISOLONE ACETATE 40 MG/ML INJ SUSP (RADIOLOG
120.0000 mg | Freq: Once | INTRAMUSCULAR | Status: AC
  Administered 2014-10-29: 120 mg via EPIDURAL

## 2014-10-29 MED ORDER — IOHEXOL 180 MG/ML  SOLN
1.0000 mL | Freq: Once | INTRAMUSCULAR | Status: AC | PRN
  Administered 2014-10-29: 1 mL via EPIDURAL

## 2014-10-29 NOTE — Discharge Instructions (Signed)

## 2014-11-11 ENCOUNTER — Other Ambulatory Visit: Payer: Self-pay | Admitting: Neurosurgery

## 2014-11-11 DIAGNOSIS — M545 Low back pain: Secondary | ICD-10-CM

## 2014-11-15 ENCOUNTER — Ambulatory Visit
Admission: RE | Admit: 2014-11-15 | Discharge: 2014-11-15 | Disposition: A | Discharge status: Home / Self Care | Payer: Medicare Other | Source: Ambulatory Visit | Attending: Neurosurgery | Admitting: Neurosurgery

## 2014-11-15 DIAGNOSIS — M545 Low back pain: Secondary | ICD-10-CM

## 2014-11-15 MED ORDER — IOHEXOL 180 MG/ML  SOLN
1.0000 mL | Freq: Once | INTRAMUSCULAR | Status: AC | PRN
  Administered 2014-11-15: 1 mL via EPIDURAL

## 2014-11-15 MED ORDER — METHYLPREDNISOLONE ACETATE 40 MG/ML INJ SUSP (RADIOLOG
120.0000 mg | Freq: Once | INTRAMUSCULAR | Status: AC
  Administered 2014-11-15: 120 mg via EPIDURAL

## 2014-11-22 ENCOUNTER — Ambulatory Visit (INDEPENDENT_AMBULATORY_CARE_PROVIDER_SITE_OTHER): Payer: Medicare Other | Admitting: Internal Medicine

## 2014-11-22 ENCOUNTER — Encounter: Payer: Self-pay | Admitting: Internal Medicine

## 2014-11-22 VITALS — BP 148/96 | HR 108 | Temp 98.6°F | Wt 196.0 lb

## 2014-11-22 DIAGNOSIS — Z23 Encounter for immunization: Secondary | ICD-10-CM | POA: Diagnosis not present

## 2014-11-22 DIAGNOSIS — F411 Generalized anxiety disorder: Secondary | ICD-10-CM

## 2014-11-22 DIAGNOSIS — I10 Essential (primary) hypertension: Secondary | ICD-10-CM | POA: Diagnosis not present

## 2014-11-22 DIAGNOSIS — M5440 Lumbago with sciatica, unspecified side: Secondary | ICD-10-CM | POA: Diagnosis not present

## 2014-11-22 MED ORDER — OXYCODONE HCL 15 MG PO TABS: 15.0000 mg | ORAL_TABLET | Freq: Four times a day (QID) | ORAL | Status: DC | PRN

## 2014-11-22 NOTE — Progress Notes (Signed)
Pre visit review using our clinic review tool, if applicable. No additional management support is needed unless otherwise documented below in the visit note. 

## 2014-11-22 NOTE — Progress Notes (Signed)
Subjective:    HPI  C/o LBP irrad down to RLE - [facility] gave him 2 shots  The patient is here to follow up on chronic LBP, GERD, hypogonadism controlled with medicines, diet and exercise. He is retired.  Working part time F/u snoring, dry mouth  Wt Readings from Last 3 Encounters:  08/19/12 196 lb (88.905 kg)  04/24/12 196 lb (88.905 kg)  04/02/12 196 lb (88.905 kg)   BP Readings from Last 3 Encounters:  08/19/12 130/90  04/24/12 130/90  04/02/12 174/90       Review of Systems  Constitutional: Positive for fatigue. Negative for appetite change and unexpected weight change.  HENT: Negative for nosebleeds, congestion, sore throat, sneezing, trouble swallowing and neck pain.   Eyes: Negative for itching and visual disturbance.  Respiratory: Negative for cough.   Cardiovascular: Negative for chest pain, palpitations and leg swelling.  Gastrointestinal: Negative for nausea, diarrhea, blood in stool and abdominal distention.  Genitourinary: Negative for frequency and hematuria.  Musculoskeletal: Positive for back pain. Negative for joint swelling and gait problem.  Skin: Negative for rash.  Neurological: Negative for dizziness, tremors, speech difficulty and weakness.  Psychiatric/Behavioral: Positive for disturbed wake/sleep cycle. Negative for suicidal ideas, dysphoric mood and agitation. The patient is not nervous/anxious.        Objective:   Physical Exam  Constitutional: He is oriented to person, place, and time. He appears well-developed.  HENT:  Right Ear: External ear normal.  Left Ear: External ear normal.  Mouth/Throat: Oropharynx is clear and moist.  Eyes: Conjunctivae normal are normal. Pupils are equal, round, and reactive to light.  Neck: Normal range of motion. No JVD present. No thyromegaly present.  Cardiovascular: Normal rate, regular rhythm, normal heart sounds and intact distal pulses.  Exam reveals no gallop and no friction rub.   No murmur  heard. Pulmonary/Chest: Effort normal and breath sounds normal. No respiratory distress. He has no wheezes. He has no rales. He exhibits no tenderness.  Abdominal: Soft. Bowel sounds are normal. He exhibits no distension and no mass. There is no tenderness. There is no rebound and no guarding.  Genitourinary: n/a Musculoskeletal: Normal range of motion. He exhibits tenderness (LS spine). He exhibits no edema.  Lymphadenopathy:    He has no cervical adenopathy.  Neurological: He is alert and oriented to person, place, and time. He has normal reflexes. No cranial nerve deficit. He exhibits normal muscle tone. Coordination normal.  Skin: Skin is warm and dry. No rash noted.  Psychiatric: He has a normal mood and affect. His behavior is normal. Judgment and thought content normal.  Wax B  Lab Results  Component Value Date   WBC 5.9 08/14/2012   HGB 16.0 08/14/2012   HCT 48.5 08/14/2012   PLT 278.0 08/14/2012   GLUCOSE 98 08/14/2012   CHOL 225* 04/21/2012   TRIG 156.0* 04/21/2012   HDL 44.30 04/21/2012   LDLDIRECT 154.1 04/21/2012   LDLCALC  Value: 133        Total Cholesterol/HDL:CHD Risk Coronary Heart Disease Risk Table                     Men   Women  1/2 Average Risk   3.4   3.3  Average Risk       5.0   4.4  2 X Average Risk   9.6   7.1  3 X Average Risk  23.4   11.0  Use the calculated Patient Ratio above and the CHD Risk Table to determine the patient's CHD Risk.        ATP III CLASSIFICATION (LDL):  <100     mg/dL   Optimal  100-129  mg/dL   Near or Above                    Optimal  130-159  mg/dL   Borderline  160-189  mg/dL   High  >190     mg/dL   Very High* 04/10/2010   ALT 21 08/14/2012   AST 21 08/14/2012   NA 139 08/14/2012   K 4.7 08/14/2012   CL 105 08/14/2012   CREATININE 1.1 08/14/2012   BUN 15 08/14/2012   CO2 25 08/14/2012   TSH 0.48 04/21/2012   PSA 0.31 04/21/2012   INR 1.24 04/17/2010   HGBA1C 5.5 08/14/2012     Assessment & Plan:

## 2014-11-25 NOTE — Assessment & Plan Note (Signed)
Continue with current prescription therapy as reflected on the Med list.  

## 2014-11-25 NOTE — Assessment & Plan Note (Signed)
Continue with current prn prescription therapy as reflected on the Med list.  

## 2015-01-04 ENCOUNTER — Telehealth: Payer: Self-pay | Admitting: Internal Medicine

## 2015-01-04 MED ORDER — ONDANSETRON HCL 4 MG PO TABS: 4.0000 mg | ORAL_TABLET | Freq: Three times a day (TID) | ORAL | Status: DC | PRN

## 2015-01-04 NOTE — Telephone Encounter (Signed)
Patient Name: Cody Frazier DOB: 07-11-1946 Initial Comment NEED FINAL ATTEMPT;Caller states husband is having abd pain; chills; gagging; vomiting; Nurse Assessment Guidelines Guideline Title Affirmed Question Affirmed Notes Final Disposition User FINAL ATTEMPT MADE - message left [place], Therapist, sports, Amy

## 2015-01-04 NOTE — Telephone Encounter (Signed)
Pt's wife calling stating pt has been extremely sick with nausea and vomitting since 3:30 am. She is afraid he will get dehydrated. She is requesting a rx for him. Please advise.

## 2015-01-04 NOTE — Telephone Encounter (Signed)
[place] [facility] - Client [place] [facility] Patient Name: Cody Frazier DOB: 09/02/1946 Initial Comment Caller states her husband is vomiting. Nurse Assessment Nurse: Malva Cogan, RN, Juliann Pulse Date/Time Eilene Ghazi Time): 01/04/2015 4:49:20 PM Confirm and document reason for call. If symptomatic, describe symptoms. ---Caller states that he had onset of vomiting without diarrhea approx 0420 this AM, has vomited approx 5-6 times thus far. Unsure if has a fever or not but has been chilling, advised to check his temp now. Current temp is 102 orally. Has the patient traveled out of the country within the last 30 days? ---No Does the patient require triage? ---Yes Related visit to physician within the last 2 weeks? ---No Does the PT have any chronic conditions? (i.e. diabetes, asthma, etc.) ---Yes List chronic conditions. ---HTN, chronic back pain, high cholesterol Guidelines Guideline Title Affirmed Question Affirmed Notes Vomiting [1] SEVERE vomiting (e.g., 6 or more times/day) AND [2] present > 8 hours Final Disposition User Go to ED Now (or PCP triage) Malva Cogan, RN, [place] states that she is not currently with her sp, advised that triager needs to obtain permission for triager to speak with caller & that to adequately & thoroughly triage her sp's symptoms that triager needs to speak with pt or someone with pt. Caller states that pt is aware that she has called the PCP's office & that pt can be reached at [phone].

## 2015-01-04 NOTE — Telephone Encounter (Signed)
MD advised verbally of below. He advises Zofran (see meds) and go to ER if symptoms persist. Pt's wife informed.

## 2015-01-05 ENCOUNTER — Other Ambulatory Visit: Payer: Self-pay | Admitting: *Deleted

## 2015-01-05 ENCOUNTER — Telehealth: Payer: Self-pay | Admitting: *Deleted

## 2015-01-05 MED ORDER — PROMETHAZINE HCL 25 MG PO TABS: 25.0000 mg | ORAL_TABLET | Freq: Four times a day (QID) | ORAL | Status: DC | PRN

## 2015-01-05 NOTE — Telephone Encounter (Signed)
[place] [facility] - Client [place] [facility] Patient Name: Cody Frazier Gender: Male DOB: February 16, 1946 Age: 69 Y 3 M 23 D Return Phone Number: [phone] (Primary) Address: City/State/Zip:  Client [facility] - Client Client Site [place] - Day Physician Plotnikov, Alex Contact Type Call Call Type Triage / Clinical Caller Name Hassan Rowan Relationship To Patient Spouse Appointment Disposition EMR Caller Not Reached Return Phone Number [phone] (Primary) Chief Complaint Vomiting Initial Comment NEED FINAL ATTEMPT;Caller states husband is having abd pain; chills; gagging; vomiting; Info pasted into Epic No Nurse Assessment Guidelines Guideline Title Affirmed Question Affirmed Notes Nurse Date/Time (Eastern Time) Disp. Time Eilene Ghazi Time) Disposition Final User 01/04/2015 3:21:07 PM Attempt made - message left [place], RN, Lattie Haw 01/04/2015 3:32:31 PM Attempt made - no message left Burress, RN, Lattie Haw 01/04/2015 4:02:42 PM Send To Clinical Follow Up Nyoka Cowden, RN, Lattie Haw 01/04/2015 4:10:56 PM FINAL ATTEMPT MADE - message left Yes Mechele Dawley, RN, Amy After Care Instructions Given Call Event Type User Date / Time Description

## 2015-01-05 NOTE — Telephone Encounter (Signed)
I called pt- I advised him we received fax from pharmacy stating Zofran wasn't covered by his insurance. I sent in new rx for promethazine per MD. Pt states he went ahead and paid for Zofran and he is feeling much better today. He thanked me for calling in his meds and states he will call us if needed and keep his appt as scheduled.

## 2015-02-15 ENCOUNTER — Encounter: Payer: Self-pay | Admitting: Gastroenterology

## 2015-02-22 ENCOUNTER — Ambulatory Visit (INDEPENDENT_AMBULATORY_CARE_PROVIDER_SITE_OTHER): Payer: Medicare Other | Admitting: Internal Medicine

## 2015-02-22 ENCOUNTER — Telehealth: Payer: Self-pay | Admitting: Internal Medicine

## 2015-02-22 ENCOUNTER — Other Ambulatory Visit: Payer: Self-pay | Admitting: Neurosurgery

## 2015-02-22 ENCOUNTER — Encounter: Payer: Self-pay | Admitting: Internal Medicine

## 2015-02-22 VITALS — BP 158/98 | HR 80 | Ht 70.0 in | Wt 202.0 lb

## 2015-02-22 DIAGNOSIS — E785 Hyperlipidemia, unspecified: Secondary | ICD-10-CM

## 2015-02-22 DIAGNOSIS — Z0000 Encounter for general adult medical examination without abnormal findings: Secondary | ICD-10-CM | POA: Diagnosis not present

## 2015-02-22 DIAGNOSIS — N320 Bladder-neck obstruction: Secondary | ICD-10-CM

## 2015-02-22 DIAGNOSIS — M545 Low back pain: Secondary | ICD-10-CM

## 2015-02-22 DIAGNOSIS — R739 Hyperglycemia, unspecified: Secondary | ICD-10-CM

## 2015-02-22 MED ORDER — OXYCODONE HCL 15 MG PO TABS: 15.0000 mg | ORAL_TABLET | Freq: Four times a day (QID) | ORAL | Status: DC | PRN

## 2015-02-22 MED ORDER — LOSARTAN POTASSIUM 25 MG PO TABS: 25.0000 mg | ORAL_TABLET | Freq: Two times a day (BID) | ORAL | Status: DC

## 2015-02-22 NOTE — Assessment & Plan Note (Signed)
Here for medicare wellness/physical  Diet: heart healthy  Physical activity: less sedentary  Depression/mood screen: negative  Hearing: intact to whispered voice  Visual acuity: grossly normal, performs annual eye exam  ADLs: capable  Fall risk: none  Home safety: good  Cognitive evaluation: intact to orientation, naming, recall and repetition  EOL planning: adv directives, full code/ I agree  I have personally reviewed and have noted  1. The patient's medical, surgical and social history  2. Their use of alcohol, tobacco or illicit drugs  3. Their current medications and supplements  4. The patient's functional ability including ADL's, fall risks, home safety risks and hearing or visual impairment.  5. Diet and physical activities  6. Evidence for depression or mood disorders 7. Providers roster    Today patient counseled on age appropriate routine health concerns for screening and prevention, each reviewed and up to date or declined. Immunizations reviewed and up to date or declined. Labs ordered and reviewed. Risk factors for depression reviewed and negative. Hearing function and visual acuity are intact. ADLs screened and addressed as needed. Functional ability and level of safety reviewed and appropriate. Education, counseling and referrals performed based on assessed risks today. Patient provided with a copy of personalized plan for preventive services.

## 2015-02-22 NOTE — Progress Notes (Signed)
Pre visit review using our clinic review tool, if applicable. No additional management support is needed unless otherwise documented below in the visit note. 

## 2015-02-22 NOTE — Patient Instructions (Signed)
Preventive Care for Adults A healthy lifestyle and preventive care can promote health and wellness. Preventive health guidelines for men include the following key practices:  A routine yearly physical is a good way to check with your health care provider about your health and preventative screening. It is a chance to share any concerns and updates on your health and to receive a thorough exam.  Visit your dentist for a routine exam and preventative care every 6 months. Brush your teeth twice a day and floss once a day. Good oral hygiene prevents tooth decay and gum disease.  The frequency of eye exams is based on your age, health, family medical history, use of contact lenses, and other factors. Follow your health care provider's recommendations for frequency of eye exams.  Eat a healthy diet. Foods such as vegetables, fruits, whole grains, low-fat dairy products, and lean protein foods contain the nutrients you need without too many calories. Decrease your intake of foods high in solid fats, added sugars, and salt. Eat the right amount of calories for you.Get information about a proper diet from your health care provider, if necessary.  Regular physical exercise is one of the most important things you can do for your health. Most adults should get at least 150 minutes of moderate-intensity exercise (any activity that increases your heart rate and causes you to sweat) each week. In addition, most adults need muscle-strengthening exercises on 2 or more days a week.  Maintain a healthy weight. The body mass index (BMI) is a screening tool to identify possible weight problems. It provides an estimate of body fat based on height and weight. Your health care provider can find your BMI and can help you achieve or maintain a healthy weight.For adults 20 years and older:  A BMI below 18.5 is considered underweight.  A BMI of 18.5 to 24.9 is normal.  A BMI of 25 to 29.9 is considered overweight.  A BMI  of 30 and above is considered obese.  Maintain normal blood lipids and cholesterol levels by exercising and minimizing your intake of saturated fat. Eat a balanced diet with plenty of fruit and vegetables. Blood tests for lipids and cholesterol should begin at age 40 and be repeated every 5 years. If your lipid or cholesterol levels are high, you are over 50, or you are at high risk for heart disease, you may need your cholesterol levels checked more frequently.Ongoing high lipid and cholesterol levels should be treated with medicines if diet and exercise are not working.  If you smoke, find out from your health care provider how to quit. If you do not use tobacco, do not start.  Lung cancer screening is recommended for adults aged 60-80 years who are at high risk for developing lung cancer because of a history of smoking. A yearly low-dose CT scan of the lungs is recommended for people who have at least a 30-pack-year history of smoking and are a current smoker or have quit within the past 15 years. A pack year of smoking is smoking an average of 1 pack of cigarettes a day for 1 year (for example: 1 pack a day for 30 years or 2 packs a day for 15 years). Yearly screening should continue until the smoker has stopped smoking for at least 15 years. Yearly screening should be stopped for people who develop a health problem that would prevent them from having lung cancer treatment.  If you choose to drink alcohol, do not have more than  2 drinks per day. One drink is considered to be 12 ounces (355 mL) of beer, 5 ounces (148 mL) of wine, or 1.5 ounces (44 mL) of liquor.  Avoid use of street drugs. Do not share needles with anyone. Ask for help if you need support or instructions about stopping the use of drugs.  High blood pressure causes heart disease and increases the risk of stroke. Your blood pressure should be checked at least every 1-2 years. Ongoing high blood pressure should be treated with  medicines, if weight loss and exercise are not effective.  If you are 45-79 years old, ask your health care provider if you should take aspirin to prevent heart disease.  Diabetes screening involves taking a blood sample to check your fasting blood sugar level. This should be done once every 3 years, after age 45, if you are within normal weight and without risk factors for diabetes. Testing should be considered at a younger age or be carried out more frequently if you are overweight and have at least 1 risk factor for diabetes.  Colorectal cancer can be detected and often prevented. Most routine colorectal cancer screening begins at the age of 50 and continues through age 75. However, your health care provider may recommend screening at an earlier age if you have risk factors for colon cancer. On a yearly basis, your health care provider may provide home test kits to check for hidden blood in the stool. Use of a small camera at the end of a tube to directly examine the colon (sigmoidoscopy or colonoscopy) can detect the earliest forms of colorectal cancer. Talk to your health care provider about this at age 50, when routine screening begins. Direct exam of the colon should be repeated every 5-10 years through age 75, unless early forms of precancerous polyps or small growths are found.  People who are at an increased risk for hepatitis B should be screened for this virus. You are considered at high risk for hepatitis B if:  You were born in a country where hepatitis B occurs often. Talk with your health care provider about which countries are considered high risk.  Your parents were born in a high-risk country and you have not received a shot to protect against hepatitis B (hepatitis B vaccine).  You have HIV or AIDS.  You use needles to inject street drugs.  You live with, or have sex with, someone who has hepatitis B.  You are a man who has sex with other men (MSM).  You get hemodialysis  treatment.  You take certain medicines for conditions such as cancer, organ transplantation, and autoimmune conditions.  Hepatitis C blood testing is recommended for all people born from 1945 through 1965 and any individual with known risks for hepatitis C.  Practice safe sex. Use condoms and avoid high-risk sexual practices to reduce the spread of sexually transmitted infections (STIs). STIs include gonorrhea, chlamydia, syphilis, trichomonas, herpes, HPV, and human immunodeficiency virus (HIV). Herpes, HIV, and HPV are viral illnesses that have no cure. They can result in disability, cancer, and death.  If you are at risk of being infected with HIV, it is recommended that you take a prescription medicine daily to prevent HIV infection. This is called preexposure prophylaxis (PrEP). You are considered at risk if:  You are a man who has sex with other men (MSM) and have other risk factors.  You are a heterosexual man, are sexually active, and are at increased risk for HIV infection.    You take drugs by injection.  You are sexually active with a partner who has HIV.  Talk with your health care provider about whether you are at high risk of being infected with HIV. If you choose to begin PrEP, you should first be tested for HIV. You should then be tested every 3 months for as long as you are taking PrEP.  A one-time screening for abdominal aortic aneurysm (AAA) and surgical repair of large AAAs by ultrasound are recommended for men ages 32 to 67 years who are current or former smokers.  Healthy men should no longer receive prostate-specific antigen (PSA) blood tests as part of routine cancer screening. Talk with your health care provider about prostate cancer screening.  Testicular cancer screening is not recommended for adult males who have no symptoms. Screening includes self-exam, a health care provider exam, and other screening tests. Consult with your health care provider about any symptoms  you have or any concerns you have about testicular cancer.  Use sunscreen. Apply sunscreen liberally and repeatedly throughout the day. You should seek shade when your shadow is shorter than you. Protect yourself by wearing long sleeves, pants, a wide-brimmed hat, and sunglasses year round, whenever you are outdoors.  Once a month, do a whole-body skin exam, using a mirror to look at the skin on your back. Tell your health care provider about new moles, moles that have irregular borders, moles that are larger than a pencil eraser, or moles that have changed in shape or color.  Stay current with required vaccines (immunizations).  Influenza vaccine. All adults should be immunized every year.  Tetanus, diphtheria, and acellular pertussis (Td, Tdap) vaccine. An adult who has not previously received Tdap or who does not know his vaccine status should receive 1 dose of Tdap. This initial dose should be followed by tetanus and diphtheria toxoids (Td) booster doses every 10 years. Adults with an unknown or incomplete history of completing a 3-dose immunization series with Td-containing vaccines should begin or complete a primary immunization series including a Tdap dose. Adults should receive a Td booster every 10 years.  Varicella vaccine. An adult without evidence of immunity to varicella should receive 2 doses or a second dose if he has previously received 1 dose.  Human papillomavirus (HPV) vaccine. Males aged 68-21 years who have not received the vaccine previously should receive the 3-dose series. Males aged 22-26 years may be immunized. Immunization is recommended through the age of 6 years for any male who has sex with males and did not get any or all doses earlier. Immunization is recommended for any person with an immunocompromised condition through the age of 49 years if he did not get any or all doses earlier. During the 3-dose series, the second dose should be obtained 4-8 weeks after the first  dose. The third dose should be obtained 24 weeks after the first dose and 16 weeks after the second dose.  Zoster vaccine. One dose is recommended for adults aged 50 years or older unless certain conditions are present.  Measles, mumps, and rubella (MMR) vaccine. Adults born before [birth year] generally are considered immune to measles and mumps. Adults born in [birth year] or later should have 1 or more doses of MMR vaccine unless there is a contraindication to the vaccine or there is laboratory evidence of immunity to each of the three diseases. A routine second dose of MMR vaccine should be obtained at least 28 days after the first dose for students attending postsecondary  schools, health care workers, or international travelers. People who received inactivated measles vaccine or an unknown type of measles vaccine during 1963-1967 should receive 2 doses of MMR vaccine. People who received inactivated mumps vaccine or an unknown type of mumps vaccine before 1979 and are at high risk for mumps infection should consider immunization with 2 doses of MMR vaccine. Unvaccinated health care workers born before 1957 who lack laboratory evidence of measles, mumps, or rubella immunity or laboratory confirmation of disease should consider measles and mumps immunization with 2 doses of MMR vaccine or rubella immunization with 1 dose of MMR vaccine.  Pneumococcal 13-valent conjugate (PCV13) vaccine. When indicated, a person who is uncertain of his immunization history and has no record of immunization should receive the PCV13 vaccine. An adult aged 19 years or older who has certain medical conditions and has not been previously immunized should receive 1 dose of PCV13 vaccine. This PCV13 should be followed with a dose of pneumococcal polysaccharide (PPSV23) vaccine. The PPSV23 vaccine dose should be obtained at least 8 weeks after the dose of PCV13 vaccine. An adult aged 19 years or older who has certain medical conditions and  previously received 1 or more doses of PPSV23 vaccine should receive 1 dose of PCV13. The PCV13 vaccine dose should be obtained 1 or more years after the last PPSV23 vaccine dose.  Pneumococcal polysaccharide (PPSV23) vaccine. When PCV13 is also indicated, PCV13 should be obtained first. All adults aged 65 years and older should be immunized. An adult younger than age 65 years who has certain medical conditions should be immunized. Any person who resides in a nursing home or long-term care facility should be immunized. An adult smoker should be immunized. People with an immunocompromised condition and certain other conditions should receive both PCV13 and PPSV23 vaccines. People with human immunodeficiency virus (HIV) infection should be immunized as soon as possible after diagnosis. Immunization during chemotherapy or radiation therapy should be avoided. Routine use of PPSV23 vaccine is not recommended for American Indians, [place] Natives, or people younger than 65 years unless there are medical conditions that require PPSV23 vaccine. When indicated, people who have unknown immunization and have no record of immunization should receive PPSV23 vaccine. One-time revaccination 5 years after the first dose of PPSV23 is recommended for people aged 19-64 years who have chronic kidney failure, nephrotic syndrome, asplenia, or immunocompromised conditions. People who received 1-2 doses of PPSV23 before age 65 years should receive another dose of PPSV23 vaccine at age 65 years or later if at least 5 years have passed since the previous dose. Doses of PPSV23 are not needed for people immunized with PPSV23 at or after age 65 years.  Meningococcal vaccine. Adults with asplenia or persistent complement component deficiencies should receive 2 doses of quadrivalent meningococcal conjugate (MenACWY-D) vaccine. The doses should be obtained at least 2 months apart. Microbiologists working with certain meningococcal bacteria,  military recruits, people at risk during an outbreak, and people who travel to or live in countries with a high rate of meningitis should be immunized. A first-year college student up through age 21 years who is living in a residence hall should receive a dose if he did not receive a dose on or after his 16th birthday. Adults who have certain high-risk conditions should receive one or more doses of vaccine.  Hepatitis A vaccine. Adults who wish to be protected from this disease, have certain high-risk conditions, work with hepatitis A-infected animals, work in hepatitis A research labs, or   travel to or work in countries with a high rate of hepatitis A should be immunized. Adults who were previously unvaccinated and who anticipate close contact with an international adoptee during the first 60 days after arrival in the [place] from a country with a high rate of hepatitis A should be immunized.  Hepatitis B vaccine. Adults should be immunized if they wish to be protected from this disease, have certain high-risk conditions, may be exposed to blood or other infectious body fluids, are household contacts or sex partners of hepatitis B positive people, are clients or workers in certain care facilities, or travel to or work in countries with a high rate of hepatitis B.  Haemophilus influenzae type b (Hib) vaccine. A previously unvaccinated person with asplenia or sickle cell disease or having a scheduled splenectomy should receive 1 dose of Hib vaccine. Regardless of previous immunization, a recipient of a hematopoietic stem cell transplant should receive a 3-dose series 6-12 months after his successful transplant. Hib vaccine is not recommended for adults with HIV infection. Preventive Service / Frequency Ages 52 to 17  Blood pressure check.** / Every 1 to 2 years.  Lipid and cholesterol check.** / Every 5 years beginning at age 69.  Hepatitis C blood test.** / For any individual with known risks for  hepatitis C.  Skin self-exam. / Monthly.  Influenza vaccine. / Every year.  Tetanus, diphtheria, and acellular pertussis (Tdap, Td) vaccine.** / Consult your health care provider. 1 dose of Td every 10 years.  Varicella vaccine.** / Consult your health care provider.  HPV vaccine. / 3 doses over 6 months, if 72 or younger.  Measles, mumps, rubella (MMR) vaccine.** / You need at least 1 dose of MMR if you were born in 1957 or later. You may also need a second dose.  Pneumococcal 13-valent conjugate (PCV13) vaccine.** / Consult your health care provider.  Pneumococcal polysaccharide (PPSV23) vaccine.** / 1 to 2 doses if you smoke cigarettes or if you have certain conditions.  Meningococcal vaccine.** / 1 dose if you are age 35 to 60 years and a Market researcher living in a residence hall, or have one of several medical conditions. You may also need additional booster doses.  Hepatitis A vaccine.** / Consult your health care provider.  Hepatitis B vaccine.** / Consult your health care provider.  Haemophilus influenzae type b (Hib) vaccine.** / Consult your health care provider. Ages 35 to 8  Blood pressure check.** / Every 1 to 2 years.  Lipid and cholesterol check.** / Every 5 years beginning at age 57.  Lung cancer screening. / Every year if you are aged 44-80 years and have a 30-pack-year history of smoking and currently smoke or have quit within the past 15 years. Yearly screening is stopped once you have quit smoking for at least 15 years or develop a health problem that would prevent you from having lung cancer treatment.  Fecal occult blood test (FOBT) of stool. / Every year beginning at age 55 and continuing until age 73. You may not have to do this test if you get a colonoscopy every 10 years.  Flexible sigmoidoscopy** or colonoscopy.** / Every 5 years for a flexible sigmoidoscopy or every 10 years for a colonoscopy beginning at age 28 and continuing until age  1.  Hepatitis C blood test.** / For all people born from [birth year] through 1965 and any individual with known risks for hepatitis C.  Skin self-exam. / Monthly.  Influenza vaccine. / Every  year.  Tetanus, diphtheria, and acellular pertussis (Tdap/Td) vaccine.** / Consult your health care provider. 1 dose of Td every 10 years.  Varicella vaccine.** / Consult your health care provider.  Zoster vaccine.** / 1 dose for adults aged 53 years or older.  Measles, mumps, rubella (MMR) vaccine.** / You need at least 1 dose of MMR if you were born in 1957 or later. You may also need a second dose.  Pneumococcal 13-valent conjugate (PCV13) vaccine.** / Consult your health care provider.  Pneumococcal polysaccharide (PPSV23) vaccine.** / 1 to 2 doses if you smoke cigarettes or if you have certain conditions.  Meningococcal vaccine.** / Consult your health care provider.  Hepatitis A vaccine.** / Consult your health care provider.  Hepatitis B vaccine.** / Consult your health care provider.  Haemophilus influenzae type b (Hib) vaccine.** / Consult your health care provider. Ages 77 and over  Blood pressure check.** / Every 1 to 2 years.  Lipid and cholesterol check.**/ Every 5 years beginning at age 85.  Lung cancer screening. / Every year if you are aged 55-80 years and have a 30-pack-year history of smoking and currently smoke or have quit within the past 15 years. Yearly screening is stopped once you have quit smoking for at least 15 years or develop a health problem that would prevent you from having lung cancer treatment.  Fecal occult blood test (FOBT) of stool. / Every year beginning at age 33 and continuing until age 11. You may not have to do this test if you get a colonoscopy every 10 years.  Flexible sigmoidoscopy** or colonoscopy.** / Every 5 years for a flexible sigmoidoscopy or every 10 years for a colonoscopy beginning at age 28 and continuing until age 73.  Hepatitis C blood  test.** / For all people born from [birth year] through 1965 and any individual with known risks for hepatitis C.  Abdominal aortic aneurysm (AAA) screening.** / A one-time screening for ages 50 to 27 years who are current or former smokers.  Skin self-exam. / Monthly.  Influenza vaccine. / Every year.  Tetanus, diphtheria, and acellular pertussis (Tdap/Td) vaccine.** / 1 dose of Td every 10 years.  Varicella vaccine.** / Consult your health care provider.  Zoster vaccine.** / 1 dose for adults aged 34 years or older.  Pneumococcal 13-valent conjugate (PCV13) vaccine.** / Consult your health care provider.  Pneumococcal polysaccharide (PPSV23) vaccine.** / 1 dose for all adults aged 63 years and older.  Meningococcal vaccine.** / Consult your health care provider.  Hepatitis A vaccine.** / Consult your health care provider.  Hepatitis B vaccine.** / Consult your health care provider.  Haemophilus influenzae type b (Hib) vaccine.** / Consult your health care provider. **Family history and personal history of risk and conditions may change your health care provider's recommendations. Document Released: 12/25/2001 Document Revised: 11/03/2013 Document Reviewed: 03/26/2011 [facility] Patient Information 2015 [place], [place]. This information is not intended to replace advice given to you by your health care provider. Make sure you discuss any questions you have with your health care provider.

## 2015-02-22 NOTE — Progress Notes (Signed)
   Subjective:    Patient ID: Cody Frazier, male    DOB: Jun 02, 1946, 69 y.o.   MRN: [id number]  HPI  The patient is here for a wellness exam.  The patient is here to follow up on chronic PUD, anxiety, headaches and chronic severe LBP symptoms controlled with medicines, diet and stretching exercise.  BP Readings from Last 3 Encounters:  02/22/15 158/98  11/22/14 148/96  11/15/14 170/95   Wt Readings from Last 3 Encounters:  02/22/15 202 lb (91.627 kg)  11/22/14 196 lb (88.905 kg)  08/25/14 205 lb (92.987 kg)       Review of Systems  Constitutional: Negative for appetite change, fatigue and unexpected weight change.  HENT: Negative for congestion, nosebleeds, sneezing, sore throat and trouble swallowing.   Eyes: Negative for itching and visual disturbance.  Respiratory: Negative for cough.   Cardiovascular: Negative for chest pain, palpitations and leg swelling.  Gastrointestinal: Negative for nausea, diarrhea, blood in stool and abdominal distention.  Genitourinary: Negative for frequency and hematuria.  Musculoskeletal: Positive for back pain and gait problem. Negative for joint swelling and neck pain.  Skin: Negative for rash.  Neurological: Negative for dizziness, tremors, speech difficulty, weakness and light-headedness.  Psychiatric/Behavioral: Negative for suicidal ideas, sleep disturbance, dysphoric mood and agitation. The patient is not nervous/anxious.        Objective:   Physical Exam  Constitutional: He is oriented to person, place, and time. He appears well-developed. No distress.  NAD Obese  HENT:  Mouth/Throat: Oropharynx is clear and moist.  Eyes: Conjunctivae are normal. Pupils are equal, round, and reactive to light.  Neck: Normal range of motion. No JVD present. No thyromegaly present.  Cardiovascular: Normal rate, regular rhythm, normal heart sounds and intact distal pulses.  Exam reveals no gallop and no friction rub.   No murmur  heard. Pulmonary/Chest: Effort normal and breath sounds normal. No respiratory distress. He has no wheezes. He has no rales. He exhibits no tenderness.  Abdominal: Soft. Bowel sounds are normal. He exhibits no distension and no mass. There is no tenderness. There is no rebound and no guarding.  Musculoskeletal: Normal range of motion. He exhibits no edema or tenderness.  Lymphadenopathy:    He has no cervical adenopathy.  Neurological: He is alert and oriented to person, place, and time. He has normal reflexes. No cranial nerve deficit. He exhibits normal muscle tone. He displays a negative Romberg sign. Coordination and gait normal.  LS is tender  Skin: Skin is warm and dry. No rash noted. No erythema.  Psychiatric: He has a normal mood and affect. His behavior is normal. Judgment and thought content normal.  Rectal per Dr Fuller Plan - pending colonoscopy  Lab Results  Component Value Date   WBC 6.5 08/19/2014   HGB 14.3 08/19/2014   HCT 42.9 08/19/2014   PLT 311.0 08/19/2014   GLUCOSE 96 08/19/2014   CHOL 234* 08/19/2014   TRIG 199.0* 08/19/2014   HDL 37.10* 08/19/2014   LDLDIRECT 187.5 08/19/2013   LDLCALC 157* 08/19/2014   ALT 23 08/19/2014   AST 23 08/19/2014   NA 141 08/19/2014   K 5.1 08/19/2014   CL 105 08/19/2014   CREATININE 1.1 08/19/2014   BUN 21 08/19/2014   CO2 27 08/19/2014   TSH 0.41 08/19/2014   PSA 0.16 08/19/2014   INR 1.24 04/17/2010   HGBA1C 5.5 08/14/2012           Assessment & Plan:

## 2015-02-22 NOTE — Telephone Encounter (Signed)
Pt just had [facility] labs in 08/2014. Please advise what labs are needed this time.

## 2015-02-22 NOTE — Telephone Encounter (Signed)
Lab orders are in Thx

## 2015-02-22 NOTE — Telephone Encounter (Signed)
Pt informed

## 2015-02-22 NOTE — Telephone Encounter (Signed)
Pt just came back up from lab and orders aren't in for today's visit.  Pt will come in on Thursday morning to do labs since he ate this morning prior to his visit.

## 2015-02-22 NOTE — Telephone Encounter (Signed)
Patient was seen today and he's wanting to get his blood work done on Thursday and he is concerned on whether the insurance company will pay for it.

## 2015-02-24 ENCOUNTER — Other Ambulatory Visit (INDEPENDENT_AMBULATORY_CARE_PROVIDER_SITE_OTHER): Payer: Medicare Other

## 2015-02-24 ENCOUNTER — Other Ambulatory Visit: Payer: Self-pay | Admitting: Internal Medicine

## 2015-02-24 ENCOUNTER — Telehealth: Payer: Self-pay | Admitting: Internal Medicine

## 2015-02-24 DIAGNOSIS — E538 Deficiency of other specified B group vitamins: Secondary | ICD-10-CM

## 2015-02-24 DIAGNOSIS — Z0000 Encounter for general adult medical examination without abnormal findings: Secondary | ICD-10-CM

## 2015-02-24 DIAGNOSIS — R739 Hyperglycemia, unspecified: Secondary | ICD-10-CM

## 2015-02-24 DIAGNOSIS — D509 Iron deficiency anemia, unspecified: Secondary | ICD-10-CM

## 2015-02-24 DIAGNOSIS — E785 Hyperlipidemia, unspecified: Secondary | ICD-10-CM | POA: Diagnosis not present

## 2015-02-24 DIAGNOSIS — N320 Bladder-neck obstruction: Secondary | ICD-10-CM | POA: Diagnosis not present

## 2015-02-24 DIAGNOSIS — R945 Abnormal results of liver function studies: Secondary | ICD-10-CM

## 2015-02-24 LAB — CBC WITH DIFFERENTIAL/PLATELET
Basophils Absolute: 0 10*3/uL (ref 0.0–0.1)
Basophils Relative: 0.3 % (ref 0.0–3.0)
Eosinophils Absolute: 0.2 10*3/uL (ref 0.0–0.7)
Eosinophils Relative: 2.4 % (ref 0.0–5.0)
HCT: 42.3 % (ref 39.0–52.0)
Hemoglobin: 14.2 g/dL (ref 13.0–17.0)
Lymphocytes Relative: 30.1 % (ref 12.0–46.0)
Lymphs Abs: 2 10*3/uL (ref 0.7–4.0)
MCHC: 33.7 g/dL (ref 30.0–36.0)
MCV: 89 fl (ref 78.0–100.0)
Monocytes Absolute: 0.5 10*3/uL (ref 0.1–1.0)
Monocytes Relative: 8.1 % (ref 3.0–12.0)
Neutro Abs: 4 10*3/uL (ref 1.4–7.7)
Neutrophils Relative %: 59.1 % (ref 43.0–77.0)
Platelets: 312 10*3/uL (ref 150.0–400.0)
RBC: 4.75 Mil/uL (ref 4.22–5.81)
RDW: 13.1 % (ref 11.5–15.5)
WBC: 6.8 10*3/uL (ref 4.0–10.5)

## 2015-02-24 LAB — URINALYSIS
Bilirubin Urine: NEGATIVE
Hgb urine dipstick: NEGATIVE
Ketones, ur: NEGATIVE
Leukocytes, UA: NEGATIVE
Nitrite: NEGATIVE
Specific Gravity, Urine: 1.01 (ref 1.000–1.030)
Total Protein, Urine: NEGATIVE
Urine Glucose: NEGATIVE
Urobilinogen, UA: 0.2 (ref 0.0–1.0)
pH: 6 (ref 5.0–8.0)

## 2015-02-24 LAB — BASIC METABOLIC PANEL
BUN: 18 mg/dL (ref 6–23)
CO2: 27 mEq/L (ref 19–32)
Calcium: 10.3 mg/dL (ref 8.4–10.5)
Chloride: 107 mEq/L (ref 96–112)
Creatinine, Ser: 1.09 mg/dL (ref 0.40–1.50)
GFR: 71.2 mL/min (ref >60.00)
Glucose, Bld: 107 mg/dL — ABNORMAL HIGH (ref 70–99)
Potassium: 5.1 mEq/L (ref 3.5–5.1)
Sodium: 141 mEq/L (ref 135–145)

## 2015-02-24 LAB — TSH: TSH: 0.75 u[IU]/mL (ref 0.35–4.50)

## 2015-02-24 LAB — LIPID PANEL
Cholesterol: 248 mg/dL — ABNORMAL HIGH (ref 0–200)
HDL: 45.9 mg/dL (ref >39.00)
NonHDL: 202.1
Total CHOL/HDL Ratio: 5
Triglycerides: 238 mg/dL — ABNORMAL HIGH (ref 0.0–149.0)
VLDL: 47.6 mg/dL — ABNORMAL HIGH (ref 0.0–40.0)

## 2015-02-24 LAB — HEPATIC FUNCTION PANEL
ALT: 15 U/L (ref 0–53)
AST: 17 U/L (ref 0–37)
Albumin: 4 g/dL (ref 3.5–5.2)
Alkaline Phosphatase: 146 U/L — ABNORMAL HIGH (ref 39–117)
Bilirubin, Direct: 0.1 mg/dL (ref 0.0–0.3)
Total Bilirubin: 0.5 mg/dL (ref 0.2–1.2)
Total Protein: 6.8 g/dL (ref 6.0–8.3)

## 2015-02-24 LAB — VITAMIN B12: Vitamin B-12: 763 pg/mL (ref 211–911)

## 2015-02-24 LAB — PSA: PSA: 0.13 ng/mL (ref 0.10–4.00)

## 2015-02-24 LAB — LDL CHOLESTEROL, DIRECT: Direct LDL: 161 mg/dL

## 2015-02-24 LAB — HEMOGLOBIN A1C: Hgb A1c MFr Bld: 5.6 % (ref 4.6–6.5)

## 2015-02-24 NOTE — Telephone Encounter (Signed)
Patient just had lab work done.  He is requesting to have b12 checked.

## 2015-02-24 NOTE — Telephone Encounter (Signed)
Lab add on sheet faxed down to [facility] lab for Vit b12.

## 2015-02-25 NOTE — Progress Notes (Signed)
Pt advised of labs, and also to come back for future labs in 3 mo

## 2015-02-28 ENCOUNTER — Ambulatory Visit
Admission: RE | Admit: 2015-02-28 | Discharge: 2015-02-28 | Disposition: A | Discharge status: Home / Self Care | Payer: Medicare Other | Source: Ambulatory Visit | Attending: Neurosurgery | Admitting: Neurosurgery

## 2015-02-28 VITALS — BP 170/88 | HR 79

## 2015-02-28 DIAGNOSIS — M545 Low back pain: Secondary | ICD-10-CM

## 2015-02-28 DIAGNOSIS — M5416 Radiculopathy, lumbar region: Secondary | ICD-10-CM | POA: Diagnosis not present

## 2015-02-28 DIAGNOSIS — M5440 Lumbago with sciatica, unspecified side: Secondary | ICD-10-CM

## 2015-02-28 MED ORDER — METHYLPREDNISOLONE ACETATE 40 MG/ML INJ SUSP (RADIOLOG
120.0000 mg | Freq: Once | INTRAMUSCULAR | Status: AC
  Administered 2015-02-28: 120 mg via EPIDURAL

## 2015-02-28 MED ORDER — IOHEXOL 180 MG/ML  SOLN
1.0000 mL | Freq: Once | INTRAMUSCULAR | Status: AC | PRN
  Administered 2015-02-28: 1 mL via EPIDURAL

## 2015-03-04 ENCOUNTER — Other Ambulatory Visit: Payer: Self-pay | Admitting: Internal Medicine

## 2015-05-20 ENCOUNTER — Other Ambulatory Visit (INDEPENDENT_AMBULATORY_CARE_PROVIDER_SITE_OTHER): Payer: Medicare Other

## 2015-05-20 ENCOUNTER — Encounter: Payer: Self-pay | Admitting: Internal Medicine

## 2015-05-20 ENCOUNTER — Ambulatory Visit (INDEPENDENT_AMBULATORY_CARE_PROVIDER_SITE_OTHER): Payer: Medicare Other | Admitting: Internal Medicine

## 2015-05-20 VITALS — BP 158/110 | HR 104 | Temp 98.2°F | Ht 70.0 in | Wt 203.8 lb

## 2015-05-20 DIAGNOSIS — H44001 Unspecified purulent endophthalmitis, right eye: Secondary | ICD-10-CM | POA: Diagnosis not present

## 2015-05-20 DIAGNOSIS — E785 Hyperlipidemia, unspecified: Secondary | ICD-10-CM

## 2015-05-20 DIAGNOSIS — H44009 Unspecified purulent endophthalmitis, unspecified eye: Secondary | ICD-10-CM | POA: Insufficient documentation

## 2015-05-20 DIAGNOSIS — I10 Essential (primary) hypertension: Secondary | ICD-10-CM | POA: Diagnosis not present

## 2015-05-20 DIAGNOSIS — R945 Abnormal results of liver function studies: Secondary | ICD-10-CM

## 2015-05-20 LAB — HEPATIC FUNCTION PANEL
ALT: 19 U/L (ref 0–53)
AST: 20 U/L (ref 0–37)
Albumin: 4.1 g/dL (ref 3.5–5.2)
Alkaline Phosphatase: 155 U/L — ABNORMAL HIGH (ref 39–117)
Bilirubin, Direct: 0.1 mg/dL (ref 0.0–0.3)
Total Bilirubin: 0.5 mg/dL (ref 0.2–1.2)
Total Protein: 7.2 g/dL (ref 6.0–8.3)

## 2015-05-20 LAB — LIPID PANEL
Cholesterol: 254 mg/dL — ABNORMAL HIGH (ref 0–200)
HDL: 49.1 mg/dL (ref >39.00)
NonHDL: 204.9
Total CHOL/HDL Ratio: 5
Triglycerides: 202 mg/dL — ABNORMAL HIGH (ref 0.0–149.0)
VLDL: 40.4 mg/dL — ABNORMAL HIGH (ref 0.0–40.0)

## 2015-05-20 LAB — LDL CHOLESTEROL, DIRECT: Direct LDL: 171 mg/dL

## 2015-05-20 MED ORDER — ERYTHROMYCIN 5 MG/GM OP OINT: 1.0000 "application " | TOPICAL_OINTMENT | Freq: Three times a day (TID) | OPHTHALMIC | Status: DC

## 2015-05-20 NOTE — Progress Notes (Signed)
   Subjective:    Patient ID: Cody Frazier, male    DOB: 01/16/1946, 69 y.o.   MRN: [id number]  HPI The patient is a 69 YO man coming in with right eye crusting and watering for 2 weeks. Overall getting slightly worse over that time. Has not spread to the left eye. No fevers or chills. Mild blurring of the vision with crusting. Yellowish crusting.   Review of Systems  Constitutional: Negative.   HENT: Negative.   Eyes: Positive for photophobia, discharge and visual disturbance. Negative for pain and itching.  Respiratory: Negative.   Cardiovascular: Negative.       Objective:   Physical Exam  Constitutional: He appears well-developed and well-nourished.  HENT:  Head: Normocephalic and atraumatic.  Right Ear: External ear normal.  Left Ear: External ear normal.  Nose: Nose normal.  Mouth/Throat: Oropharynx is clear and moist.  Eyes: EOM are normal.  Right eye with yellowish crusting and mild redness and swelling of the lids, conjunctivae clear. Left eye normal.    Filed Vitals:   05/20/15 0814  BP: 158/110  Pulse: 104  Temp: 98.2 F (36.8 C)  TempSrc: Oral  Height: 5\' 10"  (1.778 m)  Weight: 203 lb 12 oz (92.42 kg)  SpO2: 94%      Assessment & Plan:

## 2015-05-20 NOTE — Assessment & Plan Note (Signed)
Erythromycin ointment for his right eye. If no resolution of the blurred vision advised to seek care with his eye doctor.

## 2015-05-20 NOTE — Progress Notes (Signed)
Pre visit review using our clinic review tool, if applicable. No additional management support is needed unless otherwise documented below in the visit note. 

## 2015-05-20 NOTE — Patient Instructions (Signed)
We think that it is likely a bacterial infection in the right eye. We have sent in an antibiotic for the eye called erythromycin. Use a thin line of the medicine along the lower eyelid and then blink it into the eye 3 times per day for 5 days. It can make the vision blurry for up to 30 minutes after putting it on but will help the eye to heal.   You can also use warm washclothes to help with the crusting. I do not think that the left eye needs to antibiotic cream but if that one gets worse you can use it there as well.   Bacterial Conjunctivitis Bacterial conjunctivitis, commonly called pink eye, is an inflammation of the clear membrane that covers the white part of the eye (conjunctiva). The inflammation can also happen on the underside of the eyelids. The blood vessels in the conjunctiva become inflamed, causing the eye to become red or pink. Bacterial conjunctivitis may spread easily from one eye to another and from person to person (contagious).  CAUSES  Bacterial conjunctivitis is caused by bacteria. The bacteria may come from your own skin, your upper respiratory tract, or from someone else with bacterial conjunctivitis. SYMPTOMS  The normally white color of the eye or the underside of the eyelid is usually pink or red. The pink eye is usually associated with irritation, tearing, and some sensitivity to light. Bacterial conjunctivitis is often associated with a thick, yellowish discharge from the eye. The discharge may turn into a crust on the eyelids overnight, which causes your eyelids to stick together. If a discharge is present, there may also be some blurred vision in the affected eye. DIAGNOSIS  Bacterial conjunctivitis is diagnosed by your caregiver through an eye exam and the symptoms that you report. Your caregiver looks for changes in the surface tissues of your eyes, which may point to the specific type of conjunctivitis. A sample of any discharge may be collected on a cotton-tip swab if  you have a severe case of conjunctivitis, if your cornea is affected, or if you keep getting repeat infections that do not respond to treatment. The sample will be sent to a lab to see if the inflammation is caused by a bacterial infection and to see if the infection will respond to antibiotic medicines. TREATMENT   Bacterial conjunctivitis is treated with antibiotics. Antibiotic eyedrops are most often used. However, antibiotic ointments are also available. Antibiotics pills are sometimes used. Artificial tears or eye washes may ease discomfort. HOME CARE INSTRUCTIONS   To ease discomfort, apply a cool, clean washcloth to your eye for 10-20 minutes, 3-4 times a day.  Gently wipe away any drainage from your eye with a warm, wet washcloth or a cotton ball.  Wash your hands often with soap and water. Use paper towels to dry your hands.  Do not share towels or washcloths. This may spread the infection.  Change or wash your pillowcase every day.  You should not use eye makeup until the infection is gone.  Do not operate machinery or drive if your vision is blurred.  Stop using contact lenses. Ask your caregiver how to sterilize or replace your contacts before using them again. This depends on the type of contact lenses that you use.  When applying medicine to the infected eye, do not touch the edge of your eyelid with the eyedrop bottle or ointment tube. SEEK IMMEDIATE MEDICAL CARE IF:   Your infection has not improved within 3 days after  beginning treatment.  You had yellow discharge from your eye and it returns.  You have increased eye pain.  Your eye redness is spreading.  Your vision becomes blurred.  You have a fever or persistent symptoms for more than 2-3 days.  You have a fever and your symptoms suddenly get worse.  You have facial pain, redness, or swelling. MAKE SURE YOU:   Understand these instructions.  Will watch your condition.  Will get help right away if you  are not doing well or get worse. Document Released: 10/29/2005 Document Revised: 03/15/2014 Document Reviewed: 03/31/2012 [facility] Patient Information 2015 [place], [place]. This information is not intended to replace advice given to you by your health care provider. Make sure you discuss any questions you have with your health care provider.

## 2015-05-20 NOTE — Assessment & Plan Note (Signed)
BP high and he stated always high at doctor, no symptoms. Advised to follow up with his PCP, no adjustments made today.

## 2015-05-26 ENCOUNTER — Encounter: Payer: Self-pay | Admitting: Internal Medicine

## 2015-05-26 ENCOUNTER — Ambulatory Visit (INDEPENDENT_AMBULATORY_CARE_PROVIDER_SITE_OTHER): Payer: Medicare Other | Admitting: Internal Medicine

## 2015-05-26 VITALS — BP 140/90 | HR 102 | Wt 206.0 lb

## 2015-05-26 DIAGNOSIS — B079 Viral wart, unspecified: Secondary | ICD-10-CM

## 2015-05-26 DIAGNOSIS — Z23 Encounter for immunization: Secondary | ICD-10-CM

## 2015-05-26 DIAGNOSIS — E785 Hyperlipidemia, unspecified: Secondary | ICD-10-CM | POA: Diagnosis not present

## 2015-05-26 DIAGNOSIS — D485 Neoplasm of uncertain behavior of skin: Secondary | ICD-10-CM

## 2015-05-26 DIAGNOSIS — M5440 Lumbago with sciatica, unspecified side: Secondary | ICD-10-CM

## 2015-05-26 DIAGNOSIS — I10 Essential (primary) hypertension: Secondary | ICD-10-CM | POA: Diagnosis not present

## 2015-05-26 DIAGNOSIS — D509 Iron deficiency anemia, unspecified: Secondary | ICD-10-CM | POA: Diagnosis not present

## 2015-05-26 MED ORDER — METOCLOPRAMIDE HCL 5 MG PO TABS: 5.0000 mg | ORAL_TABLET | Freq: Three times a day (TID) | ORAL | Status: DC | PRN

## 2015-05-26 MED ORDER — OXYCODONE HCL 15 MG PO TABS: 15.0000 mg | ORAL_TABLET | Freq: Four times a day (QID) | ORAL | Status: DC | PRN

## 2015-05-26 MED ORDER — ATORVASTATIN CALCIUM 20 MG PO TABS: 20.0000 mg | ORAL_TABLET | Freq: Every day | ORAL | Status: DC

## 2015-05-26 NOTE — Progress Notes (Signed)
Pre visit review using our clinic review tool, if applicable. No additional management support is needed unless otherwise documented below in the visit note. 

## 2015-05-26 NOTE — Assessment & Plan Note (Signed)
See procedure 

## 2015-05-26 NOTE — Assessment & Plan Note (Signed)
CBC

## 2015-05-26 NOTE — Addendum Note (Signed)
Addended by: Cresenciano Lick on: 05/26/2015 01:34 PM   Modules accepted: Orders

## 2015-05-26 NOTE — Assessment & Plan Note (Signed)
On Losartan 

## 2015-05-26 NOTE — Progress Notes (Addendum)
Subjective:  Patient ID: Cody Frazier, male    DOB: April 15, 1946  Age: 69 y.o. MRN: [id number]  CC: No chief complaint on file.   HPI Cody Frazier presents for LBP, dyslipidemia, PUD. C/o skin lesions on face. C/o irritated growth on R abdomen.  Outpatient Prescriptions Prior to Visit  Medication Sig Dispense Refill  . Alum Hydroxide-Mag Trisilicate (GAVISCON) 40-98.1 MG CHEW Chew by mouth as needed.      Marland Kitchen atorvastatin (LIPITOR) 10 MG tablet Take 1 tablet (10 mg total) by mouth daily. 30 tablet 11  . busPIRone (BUSPAR) 7.5 MG tablet TAKE 1 - 2 TABLETS DAILY AS DIRECTED FOR ANXIETY. 120 tablet 5  . clotrimazole-betamethasone (LOTRISONE) cream Apply topically 2 (two) times daily. 60 g 1  . erythromycin ophthalmic ointment Place 1 application into both eyes 3 (three) times daily. 3.5 g 0  . ferrous sulfate 325 (65 FE) MG tablet Take 325 mg by mouth 2 (two) times daily.      . fluticasone (FLONASE) 50 MCG/ACT nasal spray 2 sprays by Nasal route daily. 16 g 6  . losartan (COZAAR) 25 MG tablet Take 1 tablet (25 mg total) by mouth 2 (two) times daily. 180 tablet 3  . LYRICA 50 MG capsule Take 50 mg by mouth 2 (two) times daily.     . Multiple Vitamins-Minerals (CENTRUM SILVER PO) Take by mouth daily.      . pantoprazole (PROTONIX) 40 MG tablet Take 1 tablet (40 mg total) by mouth 2 (two) times daily. (Patient taking differently: Take 40 mg by mouth daily. ) 180 tablet 2  . Polysacch Fe Cmp-Fe Heme Poly (BIFERA) 28 MG TABS Take by mouth daily.      . Probiotic Product (FLORA-Q 2) CAPS Take by mouth 2 (two) times daily.      . promethazine (PHENERGAN) 25 MG tablet Take 1 tablet (25 mg total) by mouth every 6 (six) hours as needed for nausea or vomiting. 60 tablet 0  . psyllium (METAMUCIL) 58.6 % packet Take 1 packet by mouth 2 (two) times daily.      Marland Kitchen triamcinolone cream (KENALOG) 0.5 % Apply 1 application topically 3 (three) times daily. 90 g 1  . oxyCODONE (ROXICODONE) 15 MG immediate  release tablet Take 1 tablet (15 mg total) by mouth 4 (four) times daily as needed for pain. For pain - Fill on or after 02/25/15 Please fill every 30 days 120 tablet 0   No facility-administered medications prior to visit.    ROS Review of Systems  Constitutional: Negative for appetite change, fatigue and unexpected weight change.  HENT: Negative for congestion, nosebleeds, sneezing, sore throat and trouble swallowing.   Eyes: Negative for itching and visual disturbance.  Respiratory: Negative for cough.   Cardiovascular: Negative for chest pain, palpitations and leg swelling.  Gastrointestinal: Negative for nausea, diarrhea, blood in stool and abdominal distention.  Genitourinary: Negative for frequency and hematuria.  Musculoskeletal: Positive for back pain, arthralgias and gait problem. Negative for joint swelling and neck pain.  Skin: Negative for rash.  Neurological: Negative for dizziness, tremors, speech difficulty and weakness.  Psychiatric/Behavioral: Positive for sleep disturbance. Negative for suicidal ideas, dysphoric mood and agitation. The patient is not nervous/anxious.     Objective:  BP 140/90 mmHg  Pulse 102  Wt 206 lb (93.441 kg)  SpO2 94%  BP Readings from Last 3 Encounters:  05/26/15 140/90  05/20/15 158/110  02/28/15 170/88    Wt Readings from Last 3 Encounters:  05/26/15 206 lb (93.441 kg)  05/20/15 203 lb 12 oz (92.42 kg)  02/22/15 202 lb (91.627 kg)    Physical Exam  Constitutional: He is oriented to person, place, and time. He appears well-developed. No distress.  NAD  HENT:  Mouth/Throat: Oropharynx is clear and moist.  Eyes: Conjunctivae are normal. Pupils are equal, round, and reactive to light.  Neck: Normal range of motion. No JVD present. No thyromegaly present.  Cardiovascular: Normal rate, regular rhythm, normal heart sounds and intact distal pulses.  Exam reveals no gallop and no friction rub.   No murmur heard. Pulmonary/Chest:  Effort normal and breath sounds normal. No respiratory distress. He has no wheezes. He has no rales. He exhibits no tenderness.  Abdominal: Soft. Bowel sounds are normal. He exhibits no distension and no mass. There is no tenderness. There is no rebound and no guarding.  Musculoskeletal: Normal range of motion. He exhibits no edema or tenderness.  Lymphadenopathy:    He has no cervical adenopathy.  Neurological: He is alert and oriented to person, place, and time. He has normal reflexes. No cranial nerve deficit. He exhibits normal muscle tone. He displays a negative Romberg sign. Coordination and gait normal.  Skin: Skin is warm and dry. No rash noted.  Psychiatric: He has a normal mood and affect. His behavior is normal. Judgment and thought content normal.  SKs and AKs in temples An irritated wart R flank   Procedure Note :     Procedure : Cryosurgery   Indication:  Wart(s) - irritated   Risks including unsuccessful procedure , bleeding, infection, bruising, scar, a need for a repeat  procedure and others were explained to the patient in detail as well as the benefits. Informed consent was obtained verbally.   1  lesion(s)  on  R flank  was/were treated with liquid nitrogen on a Q-tip in a usual fasion . Band-Aid was applied and antibiotic ointment was given for a later use.   Tolerated well. Complications none.   Postprocedure instructions :     Keep the wounds clean. You can wash them with liquid soap and water. Pat dry with gauze or a Kleenex tissue  Before applying antibiotic ointment and a Band-Aid.   You need to report immediately  if  any signs of infection develop.     Lab Results  Component Value Date   WBC 6.8 02/24/2015   HGB 14.2 02/24/2015   HCT 42.3 02/24/2015   PLT 312.0 02/24/2015   GLUCOSE 107* 02/24/2015   CHOL 254* 05/20/2015   TRIG 202.0* 05/20/2015   HDL 49.10 05/20/2015   LDLDIRECT 171.0 05/20/2015   LDLCALC 157* 08/19/2014   ALT 19 05/20/2015    AST 20 05/20/2015   NA 141 02/24/2015   K 5.1 02/24/2015   CL 107 02/24/2015   CREATININE 1.09 02/24/2015   BUN 18 02/24/2015   CO2 27 02/24/2015   TSH 0.75 02/24/2015   PSA 0.13 02/24/2015   INR 1.24 04/17/2010   HGBA1C 5.6 02/24/2015    Dg Epidurography  02/28/2015   CLINICAL DATA:  Long segment spinal fusion. Recurrent back pain. Excellent relief with last epidural steroid injection 11/15/2014. Recurrent similar back pain and lower extremity radicular pain.  EXAM: CAUDAL EPIDURAL INJECTION UNDER FLUOROSCOPIC GUIDANCE  FLUOROSCOPY TIME:  0 minutes 28 seconds  Dose: 0.297 Gycm2  PROCEDURE: After a thorough discussion of risks and benefits of the procedure including bleeding, infection, injury to nerves, blood vessels, and adjacent structures, written and  oral informed consent was obtained. Verbal consent was obtained by Dr. Gerilyn Pilgrim. Specific risks to this procedure included injury to the terminal portion of the thecal sac and/or nerve roots.  Utilizing a caudal approach, the skin overlying the sacral hiatus was cleansed and anesthetized. A 20 gauge Crawford epidural needle was advanced into the sacral epidural space. Injection of Omnipaque 180 shows a good epidural pattern with spread up to L5-S1. No vascular or subarachnoid opacification is seen.  120 mg of Depo-Medrol mixed with 5 cc of normal saline and 5cc of 1% Lidocaine were instilled. The procedure was well-tolerated, and the patient was discharged thirty minutes following the injection in good condition.  IMPRESSION: Technically successful  caudal epidural injection.   Electronically Signed   By: Dereck Ligas M.D.   On: 02/28/2015 14:58    Assessment & Plan:   Diagnoses and all orders for this visit:  Essential hypertension  Midline low back pain with sciatica, sciatica laterality unspecified  Iron deficiency anemia  Dyslipidemia  Other orders -     Discontinue: oxyCODONE (ROXICODONE) 15 MG immediate release tablet; Take 1  tablet (15 mg total) by mouth 4 (four) times daily as needed for pain. For pain - Fill on or after 05/26/15 Please fill every 30 days -     metoCLOPramide (REGLAN) 5 MG tablet; Take 1 tablet (5 mg total) by mouth every 8 (eight) hours as needed for nausea. -     Discontinue: oxyCODONE (ROXICODONE) 15 MG immediate release tablet; Take 1 tablet (15 mg total) by mouth 4 (four) times daily as needed for pain. For pain - Fill on or after 06/26/15 Please fill every 30 days -     oxyCODONE (ROXICODONE) 15 MG immediate release tablet; Take 1 tablet (15 mg total) by mouth 4 (four) times daily as needed for pain. For pain - Fill on or after 07/27/15 Please fill every 30 days  I have discontinued Mr. Beem oxyCODONE and oxyCODONE. I have also changed his oxyCODONE. Additionally, I am having him start on metoCLOPramide. Lastly, I am having him maintain his BIFERA, Multiple Vitamins-Minerals (CENTRUM SILVER PO), ferrous sulfate, FLORA-Q 2, Alum Hydroxide-Mag Trisilicate, psyllium, fluticasone, LYRICA, atorvastatin, clotrimazole-betamethasone, triamcinolone cream, pantoprazole, promethazine, losartan, busPIRone, and erythromycin.  Meds ordered this encounter  Medications  . DISCONTD: oxyCODONE (ROXICODONE) 15 MG immediate release tablet    Sig: Take 1 tablet (15 mg total) by mouth 4 (four) times daily as needed for pain. For pain - Fill on or after 05/26/15 Please fill every 30 days    Dispense:  120 tablet    Refill:  0  . metoCLOPramide (REGLAN) 5 MG tablet    Sig: Take 1 tablet (5 mg total) by mouth every 8 (eight) hours as needed for nausea.    Dispense:  90 tablet    Refill:  1  . DISCONTD: oxyCODONE (ROXICODONE) 15 MG immediate release tablet    Sig: Take 1 tablet (15 mg total) by mouth 4 (four) times daily as needed for pain. For pain - Fill on or after 06/26/15 Please fill every 30 days    Dispense:  120 tablet    Refill:  0  . oxyCODONE (ROXICODONE) 15 MG immediate release tablet    Sig: Take 1  tablet (15 mg total) by mouth 4 (four) times daily as needed for pain. For pain - Fill on or after 07/27/15 Please fill every 30 days    Dispense:  120 tablet    Refill:  0  Follow-up: No Follow-up on file.  Walker Kehr, MD

## 2015-05-26 NOTE — Assessment & Plan Note (Signed)
Chronic Oxycodone   Potential benefits of a long term opioids use as well as potential risks (i.e. addiction risk, apnea etc) and complications (i.e. Somnolence, constipation and others) were explained to the patient and were aknowledged 

## 2015-05-26 NOTE — Assessment & Plan Note (Signed)
On Lipitor 

## 2015-05-26 NOTE — Assessment & Plan Note (Signed)
sch procedure

## 2015-06-13 ENCOUNTER — Telehealth: Payer: Self-pay | Admitting: Internal Medicine

## 2015-06-13 MED ORDER — ERYTHROMYCIN 5 MG/GM OP OINT: 1.0000 "application " | TOPICAL_OINTMENT | Freq: Three times a day (TID) | OPHTHALMIC | Status: DC

## 2015-06-13 NOTE — Telephone Encounter (Signed)
Patient states he seen Dr. Doug Sou for an eye infection and she prescribed him an antibiotic.  He states that the eye infection cleared up but now it has come back.  He is requesting another antibiotic to be sent to [facility] at [facility].   Patient does not remember what antibiotic was prescribed.

## 2015-06-13 NOTE — Telephone Encounter (Signed)
Have sent in refill of antibiotic. If no improvement needs visit.

## 2015-06-13 NOTE — Telephone Encounter (Signed)
Patient aware and will call if his eye is not better after taking the antibiotics.

## 2015-08-22 ENCOUNTER — Encounter: Payer: Self-pay | Admitting: Internal Medicine

## 2015-08-22 ENCOUNTER — Ambulatory Visit (INDEPENDENT_AMBULATORY_CARE_PROVIDER_SITE_OTHER): Payer: Medicare Other | Admitting: Internal Medicine

## 2015-08-22 VITALS — BP 140/90 | HR 101 | Temp 98.8°F | Wt 208.0 lb

## 2015-08-22 DIAGNOSIS — J018 Other acute sinusitis: Secondary | ICD-10-CM

## 2015-08-22 DIAGNOSIS — M545 Low back pain: Secondary | ICD-10-CM | POA: Diagnosis not present

## 2015-08-22 DIAGNOSIS — G8929 Other chronic pain: Secondary | ICD-10-CM | POA: Diagnosis not present

## 2015-08-22 DIAGNOSIS — I10 Essential (primary) hypertension: Secondary | ICD-10-CM | POA: Diagnosis not present

## 2015-08-22 MED ORDER — OXYCODONE HCL 15 MG PO TABS: 15.0000 mg | ORAL_TABLET | Freq: Four times a day (QID) | ORAL | Status: DC | PRN

## 2015-08-22 MED ORDER — METOCLOPRAMIDE HCL 5 MG PO TABS: 5.0000 mg | ORAL_TABLET | Freq: Three times a day (TID) | ORAL | Status: DC | PRN

## 2015-08-22 NOTE — Assessment & Plan Note (Signed)
Chronic Oxycodone   Potential benefits of a long term opioids use as well as potential risks (i.e. addiction risk, apnea etc) and complications (i.e. Somnolence, constipation and others) were explained to the patient and were aknowledged 

## 2015-08-22 NOTE — Progress Notes (Signed)
Subjective:  Patient ID: Cody Frazier, male    DOB: 07-11-1946  Age: 69 y.o. MRN: [id number]  CC: No chief complaint on file.   HPI CHACE KLIPPEL presents for LBP, HTN, dyslipidemia. C/o URI sx's x 1-2 d  Outpatient Prescriptions Prior to Visit  Medication Sig Dispense Refill  . Alum Hydroxide-Mag Trisilicate (GAVISCON) 54-00.8 MG CHEW Chew by mouth as needed.      Marland Kitchen atorvastatin (LIPITOR) 20 MG tablet Take 1 tablet (20 mg total) by mouth daily. 90 tablet 3  . busPIRone (BUSPAR) 7.5 MG tablet TAKE 1 - 2 TABLETS DAILY AS DIRECTED FOR ANXIETY. 120 tablet 5  . clotrimazole-betamethasone (LOTRISONE) cream Apply topically 2 (two) times daily. 60 g 1  . erythromycin ophthalmic ointment Place 1 application into both eyes 3 (three) times daily. 3.5 g 0  . ferrous sulfate 325 (65 FE) MG tablet Take 325 mg by mouth 2 (two) times daily.      . fluticasone (FLONASE) 50 MCG/ACT nasal spray 2 sprays by Nasal route daily. 16 g 6  . losartan (COZAAR) 25 MG tablet Take 1 tablet (25 mg total) by mouth 2 (two) times daily. 180 tablet 3  . LYRICA 50 MG capsule Take 50 mg by mouth 2 (two) times daily.     . Multiple Vitamins-Minerals (CENTRUM SILVER PO) Take by mouth daily.      . pantoprazole (PROTONIX) 40 MG tablet Take 1 tablet (40 mg total) by mouth 2 (two) times daily. (Patient taking differently: Take 40 mg by mouth daily. ) 180 tablet 2  . Polysacch Fe Cmp-Fe Heme Poly (BIFERA) 28 MG TABS Take by mouth daily.      . Probiotic Product (FLORA-Q 2) CAPS Take by mouth 2 (two) times daily.      . promethazine (PHENERGAN) 25 MG tablet Take 1 tablet (25 mg total) by mouth every 6 (six) hours as needed for nausea or vomiting. 60 tablet 0  . psyllium (METAMUCIL) 58.6 % packet Take 1 packet by mouth 2 (two) times daily.      Marland Kitchen triamcinolone cream (KENALOG) 0.5 % Apply 1 application topically 3 (three) times daily. 90 g 1  . metoCLOPramide (REGLAN) 5 MG tablet Take 1 tablet (5 mg total) by mouth every 8  (eight) hours as needed for nausea. 90 tablet 1  . oxyCODONE (ROXICODONE) 15 MG immediate release tablet Take 1 tablet (15 mg total) by mouth 4 (four) times daily as needed for pain. For pain - Fill on or after 07/27/15 Please fill every 30 days 120 tablet 0   No facility-administered medications prior to visit.    ROS Review of Systems  Constitutional: Negative for appetite change, fatigue and unexpected weight change.  HENT: Positive for sinus pressure and sore throat. Negative for congestion, nosebleeds, sneezing and trouble swallowing.   Eyes: Negative for itching and visual disturbance.  Respiratory: Positive for cough.   Cardiovascular: Negative for chest pain, palpitations and leg swelling.  Gastrointestinal: Negative for nausea, diarrhea, blood in stool and abdominal distention.  Genitourinary: Negative for frequency and hematuria.  Musculoskeletal: Negative for back pain, joint swelling, gait problem and neck pain.  Skin: Negative for rash.  Neurological: Negative for dizziness, tremors, speech difficulty and weakness.  Psychiatric/Behavioral: Negative for sleep disturbance, dysphoric mood and agitation. The patient is not nervous/anxious.     Objective:  BP 140/90 mmHg  Pulse 101  Temp(Src) 98.8 F (37.1 C) (Oral)  Wt 208 lb (94.348 kg)  SpO2 92%  BP Readings from Last 3 Encounters:  08/22/15 140/90  05/26/15 140/90  05/20/15 158/110    Wt Readings from Last 3 Encounters:  08/22/15 208 lb (94.348 kg)  05/26/15 206 lb (93.441 kg)  05/20/15 203 lb 12 oz (92.42 kg)    Physical Exam  Constitutional: He is oriented to person, place, and time. He appears well-developed. No distress.  NAD  HENT:  Mouth/Throat: No oropharyngeal exudate.  Eyes: Conjunctivae are normal. Pupils are equal, round, and reactive to light.  Neck: Normal range of motion. No JVD present. No thyromegaly present.  Cardiovascular: Normal rate, regular rhythm, normal heart sounds and intact distal  pulses.  Exam reveals no gallop and no friction rub.   No murmur heard. Pulmonary/Chest: Effort normal and breath sounds normal. No respiratory distress. He has no wheezes. He has no rales. He exhibits no tenderness.  Abdominal: Soft. Bowel sounds are normal. He exhibits no distension and no mass. There is no tenderness. There is no rebound and no guarding.  Musculoskeletal: Normal range of motion. He exhibits tenderness. He exhibits no edema.  Lymphadenopathy:    He has no cervical adenopathy.  Neurological: He is alert and oriented to person, place, and time. He has normal reflexes. No cranial nerve deficit. He exhibits normal muscle tone. He displays a negative Romberg sign. Coordination and gait normal.  Skin: Skin is warm and dry. No rash noted.  Psychiatric: He has a normal mood and affect. His behavior is normal. Judgment and thought content normal.  LS tender eryth throat  Lab Results  Component Value Date   WBC 6.8 02/24/2015   HGB 14.2 02/24/2015   HCT 42.3 02/24/2015   PLT 312.0 02/24/2015   GLUCOSE 107* 02/24/2015   CHOL 254* 05/20/2015   TRIG 202.0* 05/20/2015   HDL 49.10 05/20/2015   LDLDIRECT 171.0 05/20/2015   LDLCALC 157* 08/19/2014   ALT 19 05/20/2015   AST 20 05/20/2015   NA 141 02/24/2015   K 5.1 02/24/2015   CL 107 02/24/2015   CREATININE 1.09 02/24/2015   BUN 18 02/24/2015   CO2 27 02/24/2015   TSH 0.75 02/24/2015   PSA 0.13 02/24/2015   INR 1.24 04/17/2010   HGBA1C 5.6 02/24/2015    Dg Epidurography  02/28/2015   CLINICAL DATA:  Long segment spinal fusion. Recurrent back pain. Excellent relief with last epidural steroid injection 11/15/2014. Recurrent similar back pain and lower extremity radicular pain.  EXAM: CAUDAL EPIDURAL INJECTION UNDER FLUOROSCOPIC GUIDANCE  FLUOROSCOPY TIME:  0 minutes 28 seconds  Dose: 0.297 Gycm2  PROCEDURE: After a thorough discussion of risks and benefits of the procedure including bleeding, infection, injury to nerves, blood  vessels, and adjacent structures, written and oral informed consent was obtained. Verbal consent was obtained by Dr. Gerilyn Pilgrim. Specific risks to this procedure included injury to the terminal portion of the thecal sac and/or nerve roots.  Utilizing a caudal approach, the skin overlying the sacral hiatus was cleansed and anesthetized. A 20 gauge Crawford epidural needle was advanced into the sacral epidural space. Injection of Omnipaque 180 shows a good epidural pattern with spread up to L5-S1. No vascular or subarachnoid opacification is seen.  120 mg of Depo-Medrol mixed with 5 cc of normal saline and 5cc of 1% Lidocaine were instilled. The procedure was well-tolerated, and the patient was discharged thirty minutes following the injection in good condition.  IMPRESSION: Technically successful  caudal epidural injection.   Electronically Signed   By: Arvilla Market.D.  On: 02/28/2015 14:58    Assessment & Plan:   Diagnoses and all orders for this visit:  Other acute sinusitis  Essential hypertension  Chronic bilateral low back pain, with sciatica presence unspecified  Other orders -     metoCLOPramide (REGLAN) 5 MG tablet; Take 1 tablet (5 mg total) by mouth every 8 (eight) hours as needed for nausea. -     Discontinue: oxyCODONE (ROXICODONE) 15 MG immediate release tablet; Take 1 tablet (15 mg total) by mouth 4 (four) times daily as needed for pain. For pain - Fill on or after 08/26/15 Please fill every 30 days -     Discontinue: oxyCODONE (ROXICODONE) 15 MG immediate release tablet; Take 1 tablet (15 mg total) by mouth 4 (four) times daily as needed for pain. For pain - Fill on or after 09/26/15 Please fill every 30 days -     oxyCODONE (ROXICODONE) 15 MG immediate release tablet; Take 1 tablet (15 mg total) by mouth 4 (four) times daily as needed for pain. For pain - Fill on or after 10/26/15 Please fill every 30 days   I have discontinued Mr. Birr oxyCODONE and oxyCODONE. I have also  changed his oxyCODONE. Additionally, I am having him maintain his BIFERA, Multiple Vitamins-Minerals (CENTRUM SILVER PO), ferrous sulfate, FLORA-Q 2, Alum Hydroxide-Mag Trisilicate, psyllium, fluticasone, LYRICA, clotrimazole-betamethasone, triamcinolone cream, pantoprazole, promethazine, losartan, busPIRone, atorvastatin, erythromycin, and metoCLOPramide.  Meds ordered this encounter  Medications  . metoCLOPramide (REGLAN) 5 MG tablet    Sig: Take 1 tablet (5 mg total) by mouth every 8 (eight) hours as needed for nausea.    Dispense:  90 tablet    Refill:  3  . DISCONTD: oxyCODONE (ROXICODONE) 15 MG immediate release tablet    Sig: Take 1 tablet (15 mg total) by mouth 4 (four) times daily as needed for pain. For pain - Fill on or after 08/26/15 Please fill every 30 days    Dispense:  120 tablet    Refill:  0  . DISCONTD: oxyCODONE (ROXICODONE) 15 MG immediate release tablet    Sig: Take 1 tablet (15 mg total) by mouth 4 (four) times daily as needed for pain. For pain - Fill on or after 09/26/15 Please fill every 30 days    Dispense:  120 tablet    Refill:  0  . oxyCODONE (ROXICODONE) 15 MG immediate release tablet    Sig: Take 1 tablet (15 mg total) by mouth 4 (four) times daily as needed for pain. For pain - Fill on or after 10/26/15 Please fill every 30 days    Dispense:  120 tablet    Refill:  0     Follow-up: No Follow-up on file.  Walker Kehr, MD

## 2015-08-22 NOTE — Assessment & Plan Note (Signed)
Losartan 

## 2015-08-22 NOTE — Assessment & Plan Note (Signed)
10/16 - likely viral OTC meds

## 2015-08-22 NOTE — Patient Instructions (Signed)
Use over-the-counter  "cold" medicines  such as "Afrin" nasal spray for nasal congestion as directed instead. Use" Delsym" or" Robitussin" cough syrup varietis for cough.  You can use plain "Tylenol" or "Advil" for fever, chills and achyness. Use Halls or Ricola cough drops.   "Common cold" symptoms are usually triggered by a virus.  The antibiotics are usually not necessary. On average, a" viral cold" illness would take 4-7 days to resolve. Please, make an appointment if you are not better or if you're worse.  

## 2015-08-22 NOTE — Progress Notes (Signed)
Pre visit review using our clinic review tool, if applicable. No additional management support is needed unless otherwise documented below in the visit note. 

## 2015-08-26 ENCOUNTER — Telehealth: Payer: Self-pay | Admitting: Internal Medicine

## 2015-08-26 ENCOUNTER — Ambulatory Visit: Payer: Medicare Other | Admitting: Internal Medicine

## 2015-08-26 ENCOUNTER — Ambulatory Visit (INDEPENDENT_AMBULATORY_CARE_PROVIDER_SITE_OTHER): Payer: Medicare Other | Admitting: Internal Medicine

## 2015-08-26 ENCOUNTER — Ambulatory Visit: Payer: Medicare Other

## 2015-08-26 ENCOUNTER — Ambulatory Visit (INDEPENDENT_AMBULATORY_CARE_PROVIDER_SITE_OTHER)
Admission: RE | Admit: 2015-08-26 | Discharge: 2015-08-26 | Disposition: A | Discharge status: Home / Self Care | Payer: Medicare Other | Source: Ambulatory Visit | Attending: Internal Medicine | Admitting: Internal Medicine

## 2015-08-26 ENCOUNTER — Encounter: Payer: Self-pay | Admitting: Internal Medicine

## 2015-08-26 VITALS — BP 124/84 | HR 117 | Temp 98.9°F | Resp 18 | Wt 203.0 lb

## 2015-08-26 DIAGNOSIS — R05 Cough: Secondary | ICD-10-CM

## 2015-08-26 DIAGNOSIS — R509 Fever, unspecified: Secondary | ICD-10-CM | POA: Diagnosis not present

## 2015-08-26 DIAGNOSIS — R062 Wheezing: Secondary | ICD-10-CM

## 2015-08-26 DIAGNOSIS — R059 Cough, unspecified: Secondary | ICD-10-CM

## 2015-08-26 MED ORDER — DOXYCYCLINE HYCLATE 100 MG PO TABS: 100.0000 mg | ORAL_TABLET | Freq: Two times a day (BID) | ORAL | Status: DC

## 2015-08-26 NOTE — Progress Notes (Signed)
Pre visit review using our clinic review tool, if applicable. No additional management support is needed unless otherwise documented below in the visit note. 

## 2015-08-26 NOTE — Telephone Encounter (Signed)
The cxr showed no obvious pneumonia.  He should take the antibiotics and call if no improvement.

## 2015-08-26 NOTE — Progress Notes (Signed)
Subjective:    Patient ID: Cody Frazier, male    DOB: August 03, 1946, 69 y.o.   MRN: [id number]  HPI  He is here today for worsening of his cold symptoms. His symptoms started Saturday and he was seen on Monday by Dr. Alain Marion.  Since Monday his body discomfort has increased, increased congestion in his chest and his cough has been persistent.  He has a wet cough, but can not get the phlegm up.   He has had wheezing and has noticed sob with going up stairs, which is new.  His sinus pain has improved.  He denies fever. He is taking coricidan cough medication and it does help.      Medications and allergies reviewed with patient and updated if appropriate.  Patient Active Problem List   Diagnosis Date Noted  . Wart viral 05/26/2015  . Eye infection 05/20/2015  . Motion sickness 05/24/2014  . Eczema 02/22/2014  . Pneumonia, organism unspecified 11/20/2013  . Neoplasm of uncertain behavior of skin 03/01/2013  . Acute sinus infection 12/11/2012  . Kidney stone 08/19/2012  . Rash 08/13/2011  . Hypogonadism male 05/27/2011  . Well adult exam 02/09/2011  . Cerumen impaction 02/09/2011  . Actinic keratoses 02/09/2011  . Observation for suspected malignant neoplasm 02/01/2011  . GASTROPARESIS 12/21/2010  . Anxiety state 10/20/2010  . BARRETTS ESOPHAGUS 01/14/2009  . OTHER OBSTRUCTION OF DUODENUM 01/14/2009  . ACUTE GASTRIC ULCER W/HEMORRHAGE AND OBSTRUCTION 08/30/2008  . FATIGUE 08/26/2008  . GASTROENTERITIS 08/12/2008  . G I BLEED 08/12/2008  . COUGH 08/12/2008  . CHEST PAIN UNSPECIFIED 08/12/2008  . ABDOMINAL PAIN, EPIGASTRIC 08/12/2008  . URINARY TRACT INFECTION (UTI) 05/05/2008  . ALLERGIC RHINITIS 10/22/2007  . ABDOMINAL PAIN, UNSPECIFIED SITE 10/15/2007  . GERD 09/11/2007  . BENIGN PROSTATIC HYPERTROPHY 09/11/2007  . NEPHROLITHIASIS, HX OF 09/11/2007  . Dyslipidemia 06/09/2007  . Iron deficiency anemia 06/09/2007  . Essential hypertension 06/09/2007  . DIVERTICULOSIS,  COLON 06/09/2007  . LOW BACK PAIN 06/09/2007  . COLONIC POLYPS, HX OF 06/09/2007    Past Medical History  Diagnosis Date  . Anemia, iron deficiency   . Hyperplastic colonic polyp 01/2000  . Diverticulosis of colon   . Hypertension   . Chronic LBP   . Nephrolithiasis     hx of B  . GERD (gastroesophageal reflux disease)   . Peptic ulcer disease with hemorrhage 08/2008    and GOO H. Pylori Ab negative  . BPH (benign prostatic hyperplasia)   . Allergic rhinitis   . DDD (degenerative disc disease)   . Hyperlipidemia   . Atrial arrhythmia   . Renal cyst   . Duodenal stricture   . Gastric outlet obstruction   . Hemorrhoids   . Barrett's esophagus without dysplasia   . Atrial fib/flutter, transient     following prior surgeries x 2    Past Surgical History  Procedure Laterality Date  . Back surgery  02/2003    L5  . Lumbar fusion  2009  . Hiatal hernia repair    . Appendectomy    . Bilroth i procedure  2011    Dr Johney Maine    Social History   Social History  . Marital Status: Married    Spouse Name: Macen Joslin  . Number of Children: N/A  . Years of Education: N/A   Occupational History  . PASTOR    Social History Main Topics  . Smoking status: Former Research scientist (life sciences)  . Smokeless tobacco: Never Used  Comment: as a teenager  . Alcohol Use: No  . Drug Use: No  . Sexual Activity: Yes   Other Topics Concern  . None   Social History Narrative   Patient gets regular exercise   Married x 42 years    Review of Systems  Constitutional: Positive for appetite change (decreased). Negative for fever and chills.  HENT: Negative for congestion, ear pain, sinus pressure and sore throat.   Respiratory: Positive for cough, shortness of breath (with stairs - new) and wheezing.   Cardiovascular: Negative for chest pain.  Gastrointestinal: Negative for nausea and diarrhea.  Musculoskeletal: Negative for myalgias.  Neurological: Negative for light-headedness and headaches.        Objective:   Filed Vitals:   08/26/15 1050  BP: 124/84  Pulse: 117  Temp: 98.9 F (37.2 C)  Resp: 18   Filed Weights   08/26/15 1050  Weight: 203 lb (92.08 kg)   Body mass index is 29.13 kg/(m^2).   Physical Exam  Constitutional: He appears well-developed and well-nourished. No distress.  HENT:  Head: Normocephalic and atraumatic.  Right Ear: External ear normal.  Left Ear: External ear normal.  Mouth/Throat: No oropharyngeal exudate.  Eyes: Conjunctivae are normal.  Neck: Neck supple. No tracheal deviation present. No thyromegaly present.  Cardiovascular: Normal rate, regular rhythm and normal heart sounds.   Pulmonary/Chest: Effort normal. No respiratory distress. He has wheezes (right side > left).  Lymphadenopathy:    He has no cervical adenopathy.        Assessment & Plan:   Cough, wheeze He has wheezing on exam right > left and I am concerned that he may have pneumonia Will start doxycycline 100 mg bid x 10 days cxr today Continue coricidan cold medication Increase rest, fluids  Call if symptoms worsen/do not improve

## 2015-08-26 NOTE — Patient Instructions (Addendum)
You will have a chest xray done today and we will call you later today with the results.   Your prescription(s) have been submitted to your pharmacy for the antibiotic. Please take as directed and contact our office if you believe you are having problem(s) with the medication(s).  Continue over the counter cold medications and increase your rest and fluids.  Call if symptoms worsen or do not improve

## 2015-08-29 NOTE — Telephone Encounter (Signed)
LVM informing pt

## 2015-09-13 ENCOUNTER — Encounter: Payer: Self-pay | Admitting: Gastroenterology

## 2015-10-04 ENCOUNTER — Ambulatory Visit (INDEPENDENT_AMBULATORY_CARE_PROVIDER_SITE_OTHER): Payer: Medicare Other

## 2015-10-04 DIAGNOSIS — Z23 Encounter for immunization: Secondary | ICD-10-CM | POA: Diagnosis not present

## 2015-11-18 ENCOUNTER — Other Ambulatory Visit: Payer: Self-pay | Admitting: Internal Medicine

## 2015-11-23 ENCOUNTER — Ambulatory Visit (INDEPENDENT_AMBULATORY_CARE_PROVIDER_SITE_OTHER): Payer: Medicare Other | Admitting: Internal Medicine

## 2015-11-23 ENCOUNTER — Encounter: Payer: Self-pay | Admitting: Internal Medicine

## 2015-11-23 VITALS — BP 138/84 | HR 84 | Wt 204.0 lb

## 2015-11-23 DIAGNOSIS — I10 Essential (primary) hypertension: Secondary | ICD-10-CM | POA: Diagnosis not present

## 2015-11-23 DIAGNOSIS — K2100 Gastro-esophageal reflux disease with esophagitis, without bleeding: Secondary | ICD-10-CM

## 2015-11-23 DIAGNOSIS — G8929 Other chronic pain: Secondary | ICD-10-CM

## 2015-11-23 DIAGNOSIS — L570 Actinic keratosis: Secondary | ICD-10-CM | POA: Diagnosis not present

## 2015-11-23 DIAGNOSIS — D509 Iron deficiency anemia, unspecified: Secondary | ICD-10-CM

## 2015-11-23 DIAGNOSIS — M5440 Lumbago with sciatica, unspecified side: Secondary | ICD-10-CM

## 2015-11-23 DIAGNOSIS — N320 Bladder-neck obstruction: Secondary | ICD-10-CM

## 2015-11-23 DIAGNOSIS — Z0000 Encounter for general adult medical examination without abnormal findings: Secondary | ICD-10-CM

## 2015-11-23 MED ORDER — OXYCODONE HCL 15 MG PO TABS: 15.0000 mg | ORAL_TABLET | Freq: Four times a day (QID) | ORAL | Status: DC | PRN

## 2015-11-23 NOTE — Assessment & Plan Note (Signed)
On Losartan 

## 2015-11-23 NOTE — Assessment & Plan Note (Signed)
See cryo 

## 2015-11-23 NOTE — Patient Instructions (Signed)
   Postprocedure instructions :     Keep the wounds clean. You can wash them with liquid soap and water. Pat dry with gauze or a Kleenex tissue  Before applying antibiotic ointment and a Band-Aid.   You need to report immediately  if  any signs of infection develop.    

## 2015-11-23 NOTE — Progress Notes (Signed)
Pre visit review using our clinic review tool, if applicable. No additional management support is needed unless otherwise documented below in the visit note. 

## 2015-11-23 NOTE — Assessment & Plan Note (Signed)
Chronic  Pantoprazole

## 2015-11-23 NOTE — Progress Notes (Signed)
Subjective:  Patient ID: Cody Frazier, male    DOB: 14-Aug-1946  Age: 70 y.o. MRN: [id number]  CC: No chief complaint on file.   HPI Cody Frazier presents for LBP, GERD, dyslipidemia f/u  Outpatient Prescriptions Prior to Visit  Medication Sig Dispense Refill  . Alum Hydroxide-Mag Trisilicate (GAVISCON) A999333 MG CHEW Chew by mouth as needed.      Marland Kitchen atorvastatin (LIPITOR) 20 MG tablet Take 1 tablet (20 mg total) by mouth daily. 90 tablet 3  . busPIRone (BUSPAR) 7.5 MG tablet TAKE 1 - 2 TABLETS DAILY AS DIRECTED FOR ANXIETY. 120 tablet 5  . clotrimazole-betamethasone (LOTRISONE) cream Apply topically 2 (two) times daily. 60 g 1  . doxycycline (VIBRA-TABS) 100 MG tablet Take 1 tablet (100 mg total) by mouth 2 (two) times daily. 20 tablet 0  . erythromycin ophthalmic ointment Place 1 application into both eyes 3 (three) times daily. 3.5 g 0  . ferrous sulfate 325 (65 FE) MG tablet Take 325 mg by mouth 2 (two) times daily.      . fluticasone (FLONASE) 50 MCG/ACT nasal spray 2 sprays by Nasal route daily. 16 g 6  . losartan (COZAAR) 25 MG tablet Take 1 tablet (25 mg total) by mouth 2 (two) times daily. 180 tablet 3  . LYRICA 50 MG capsule Take 50 mg by mouth 2 (two) times daily.     . metoCLOPramide (REGLAN) 5 MG tablet Take 1 tablet (5 mg total) by mouth every 8 (eight) hours as needed for nausea. 90 tablet 3  . Multiple Vitamins-Minerals (CENTRUM SILVER PO) Take by mouth daily.      . ondansetron (ZOFRAN) 4 MG tablet TAKE 1 TABLET (4 MG TOTAL) BY MOUTH EVERY 8 (EIGHT) HOURS AS NEEDED FOR NAUSEA OR VOMITING. 20 tablet 0  . oxyCODONE (ROXICODONE) 15 MG immediate release tablet Take 1 tablet (15 mg total) by mouth 4 (four) times daily as needed for pain. For pain - Fill on or after 10/26/15 Please fill every 30 days 120 tablet 0  . pantoprazole (PROTONIX) 40 MG tablet Take 1 tablet (40 mg total) by mouth 2 (two) times daily. (Patient taking differently: Take 40 mg by mouth daily. ) 180  tablet 2  . Polysacch Fe Cmp-Fe Heme Poly (BIFERA) 28 MG TABS Take by mouth daily.      . Probiotic Product (FLORA-Q 2) CAPS Take by mouth 2 (two) times daily.      . promethazine (PHENERGAN) 25 MG tablet Take 1 tablet (25 mg total) by mouth every 6 (six) hours as needed for nausea or vomiting. 60 tablet 0  . psyllium (METAMUCIL) 58.6 % packet Take 1 packet by mouth 2 (two) times daily.      Marland Kitchen triamcinolone cream (KENALOG) 0.5 % Apply 1 application topically 3 (three) times daily. 90 g 1   No facility-administered medications prior to visit.    ROS Review of Systems  Constitutional: Negative for appetite change, fatigue and unexpected weight change.  HENT: Negative for congestion, nosebleeds, sneezing, sore throat and trouble swallowing.   Eyes: Negative for itching and visual disturbance.  Respiratory: Negative for cough.   Cardiovascular: Negative for chest pain, palpitations and leg swelling.  Gastrointestinal: Negative for nausea, diarrhea, blood in stool and abdominal distention.  Genitourinary: Negative for frequency and hematuria.  Musculoskeletal: Positive for back pain and gait problem. Negative for joint swelling and neck pain.  Skin: Negative for rash.  Neurological: Negative for dizziness, tremors, speech difficulty and weakness.  Psychiatric/Behavioral: Negative for sleep disturbance, dysphoric mood and agitation. The patient is not nervous/anxious.     Objective:  BP 138/84 mmHg  Pulse 84  Wt 204 lb (92.534 kg)  SpO2 96%  BP Readings from Last 3 Encounters:  11/23/15 138/84  08/26/15 124/84  08/22/15 140/90    Wt Readings from Last 3 Encounters:  11/23/15 204 lb (92.534 kg)  08/26/15 203 lb (92.08 kg)  08/22/15 208 lb (94.348 kg)    Physical Exam  Constitutional: He is oriented to person, place, and time. He appears well-developed. No distress.  NAD  HENT:  Mouth/Throat: Oropharynx is clear and moist.  Eyes: Conjunctivae are normal. Pupils are equal,  round, and reactive to light.  Neck: Normal range of motion. No JVD present. No thyromegaly present.  Cardiovascular: Normal rate, regular rhythm, normal heart sounds and intact distal pulses.  Exam reveals no gallop and no friction rub.   No murmur heard. Pulmonary/Chest: Effort normal and breath sounds normal. No respiratory distress. He has no wheezes. He has no rales. He exhibits no tenderness.  Abdominal: Soft. Bowel sounds are normal. He exhibits no distension and no mass. There is no tenderness. There is no rebound and no guarding.  Musculoskeletal: Normal range of motion. He exhibits tenderness. He exhibits no edema.  Lymphadenopathy:    He has no cervical adenopathy.  Neurological: He is alert and oriented to person, place, and time. He has normal reflexes. No cranial nerve deficit. He exhibits normal muscle tone. He displays a negative Romberg sign. Coordination and gait normal.  Skin: Skin is warm and dry. No rash noted.  Psychiatric: He has a normal mood and affect. His behavior is normal. Judgment and thought content normal.  Obese R AKs  Lab Results  Component Value Date   WBC 6.8 02/24/2015   HGB 14.2 02/24/2015   HCT 42.3 02/24/2015   PLT 312.0 02/24/2015   GLUCOSE 107* 02/24/2015   CHOL 254* 05/20/2015   TRIG 202.0* 05/20/2015   HDL 49.10 05/20/2015   LDLDIRECT 171.0 05/20/2015   LDLCALC 157* 08/19/2014   ALT 19 05/20/2015   AST 20 05/20/2015   NA 141 02/24/2015   K 5.1 02/24/2015   CL 107 02/24/2015   CREATININE 1.09 02/24/2015   BUN 18 02/24/2015   CO2 27 02/24/2015   TSH 0.75 02/24/2015   PSA 0.13 02/24/2015   INR 1.24 04/17/2010   HGBA1C 5.6 02/24/2015    Dg Chest 2 View  08/26/2015  CLINICAL DATA:  Cough, fever. EXAM: CHEST  2 VIEW COMPARISON:  November 20, 2013. FINDINGS: The heart size and mediastinal contours are within normal limits. Left lung is clear. Minimal right basilar subsegmental atelectasis is noted. No pneumothorax or pleural effusion is  noted. The visualized skeletal structures are unremarkable. IMPRESSION: Minimal right basilar subsegmental atelectasis. Electronically Signed   By: Marijo Conception, M.D.   On: 08/26/2015 13:38    Procedure Note :     Procedure : Cryosurgery   Indication:    Actinic keratosis(es)   Risks including unsuccessful procedure , bleeding, infection, bruising, scar, a need for a repeat  procedure and others were explained to the patient in detail as well as the benefits. Informed consent was obtained verbally.   5  lesion(s)  on R face   was/were treated with liquid nitrogen on a Q-tip in a usual fasion . Band-Aid was applied and antibiotic ointment was given for a later use.   Tolerated well. Complications none.  Postprocedure instructions :     Keep the wounds clean. You can wash them with liquid soap and water. Pat dry with gauze or a Kleenex tissue  Before applying antibiotic ointment and a Band-Aid.   You need to report immediately  if  any signs of infection develop.     Assessment & Plan:   There are no diagnoses linked to this encounter. I am having Mr. Canfield maintain his BIFERA, Multiple Vitamins-Minerals (CENTRUM SILVER PO), ferrous sulfate, FLORA-Q 2, Alum Hydroxide-Mag Trisilicate, psyllium, fluticasone, LYRICA, clotrimazole-betamethasone, triamcinolone cream, pantoprazole, promethazine, losartan, busPIRone, atorvastatin, erythromycin, metoCLOPramide, oxyCODONE, doxycycline, and ondansetron.  No orders of the defined types were placed in this encounter.     Follow-up: No Follow-up on file.  Walker Kehr, MD

## 2015-11-23 NOTE — Assessment & Plan Note (Signed)
Labs Off iron

## 2015-11-23 NOTE — Assessment & Plan Note (Signed)
Chronic Oxycodone   Potential benefits of a long term opioids use as well as potential risks (i.e. addiction risk, apnea etc) and complications (i.e. Somnolence, constipation and others) were explained to the patient and were aknowledged 

## 2016-02-09 ENCOUNTER — Other Ambulatory Visit (INDEPENDENT_AMBULATORY_CARE_PROVIDER_SITE_OTHER): Payer: Medicare Other

## 2016-02-09 DIAGNOSIS — M5440 Lumbago with sciatica, unspecified side: Secondary | ICD-10-CM | POA: Diagnosis not present

## 2016-02-09 DIAGNOSIS — K2100 Gastro-esophageal reflux disease with esophagitis, without bleeding: Secondary | ICD-10-CM

## 2016-02-09 DIAGNOSIS — D509 Iron deficiency anemia, unspecified: Secondary | ICD-10-CM

## 2016-02-09 DIAGNOSIS — G8929 Other chronic pain: Secondary | ICD-10-CM

## 2016-02-09 DIAGNOSIS — Z0000 Encounter for general adult medical examination without abnormal findings: Secondary | ICD-10-CM

## 2016-02-09 DIAGNOSIS — I10 Essential (primary) hypertension: Secondary | ICD-10-CM

## 2016-02-09 DIAGNOSIS — N320 Bladder-neck obstruction: Secondary | ICD-10-CM

## 2016-02-09 LAB — BASIC METABOLIC PANEL
BUN: 22 mg/dL (ref 6–23)
CO2: 27 mEq/L (ref 19–32)
Calcium: 10.3 mg/dL (ref 8.4–10.5)
Chloride: 104 mEq/L (ref 96–112)
Creatinine, Ser: 1.12 mg/dL (ref 0.40–1.50)
GFR: 68.81 mL/min (ref >60.00)
Glucose, Bld: 119 mg/dL — ABNORMAL HIGH (ref 70–99)
Potassium: 4.8 mEq/L (ref 3.5–5.1)
Sodium: 141 mEq/L (ref 135–145)

## 2016-02-09 LAB — URINALYSIS
Bilirubin Urine: NEGATIVE
Ketones, ur: NEGATIVE
Leukocytes, UA: NEGATIVE
Nitrite: NEGATIVE
Specific Gravity, Urine: 1.015 (ref 1.000–1.030)
Total Protein, Urine: NEGATIVE
Urine Glucose: NEGATIVE
Urobilinogen, UA: 0.2 (ref 0.0–1.0)
pH: 6 (ref 5.0–8.0)

## 2016-02-09 LAB — LIPID PANEL
Cholesterol: 199 mg/dL (ref 0–200)
HDL: 50.2 mg/dL (ref >39.00)
LDL Cholesterol: 112 mg/dL — ABNORMAL HIGH (ref 0–99)
NonHDL: 149.22
Total CHOL/HDL Ratio: 4
Triglycerides: 186 mg/dL — ABNORMAL HIGH (ref 0.0–149.0)
VLDL: 37.2 mg/dL (ref 0.0–40.0)

## 2016-02-09 LAB — CBC WITH DIFFERENTIAL/PLATELET
Basophils Absolute: 0 10*3/uL (ref 0.0–0.1)
Basophils Relative: 0.5 % (ref 0.0–3.0)
Eosinophils Absolute: 0.3 10*3/uL (ref 0.0–0.7)
Eosinophils Relative: 3.8 % (ref 0.0–5.0)
HCT: 39.6 % (ref 39.0–52.0)
Hemoglobin: 13.5 g/dL (ref 13.0–17.0)
Lymphocytes Relative: 28.3 % (ref 12.0–46.0)
Lymphs Abs: 2.2 10*3/uL (ref 0.7–4.0)
MCHC: 34.2 g/dL (ref 30.0–36.0)
MCV: 90.4 fl (ref 78.0–100.0)
Monocytes Absolute: 0.6 10*3/uL (ref 0.1–1.0)
Monocytes Relative: 8 % (ref 3.0–12.0)
Neutro Abs: 4.5 10*3/uL (ref 1.4–7.7)
Neutrophils Relative %: 59.4 % (ref 43.0–77.0)
Platelets: 344 10*3/uL (ref 150.0–400.0)
RBC: 4.38 Mil/uL (ref 4.22–5.81)
RDW: 13.3 % (ref 11.5–15.5)
WBC: 7.7 10*3/uL (ref 4.0–10.5)

## 2016-02-09 LAB — PSA: PSA: 0.12 ng/mL (ref 0.10–4.00)

## 2016-02-09 LAB — HEPATIC FUNCTION PANEL
ALT: 16 U/L (ref 0–53)
AST: 17 U/L (ref 0–37)
Albumin: 4.3 g/dL (ref 3.5–5.2)
Alkaline Phosphatase: 123 U/L — ABNORMAL HIGH (ref 39–117)
Bilirubin, Direct: 0.1 mg/dL (ref 0.0–0.3)
Total Bilirubin: 0.6 mg/dL (ref 0.2–1.2)
Total Protein: 6.8 g/dL (ref 6.0–8.3)

## 2016-02-09 LAB — IBC PANEL
Iron: 78 ug/dL (ref 42–165)
Saturation Ratios: 16.4 % — ABNORMAL LOW (ref 20.0–50.0)
Transferrin: 340 mg/dL (ref 212.0–360.0)

## 2016-02-09 LAB — TSH: TSH: 0.76 u[IU]/mL (ref 0.35–4.50)

## 2016-02-12 ENCOUNTER — Other Ambulatory Visit: Payer: Self-pay | Admitting: Internal Medicine

## 2016-02-21 ENCOUNTER — Encounter: Payer: Self-pay | Admitting: Internal Medicine

## 2016-02-21 ENCOUNTER — Ambulatory Visit (INDEPENDENT_AMBULATORY_CARE_PROVIDER_SITE_OTHER): Payer: Medicare Other | Admitting: Internal Medicine

## 2016-02-21 VITALS — BP 160/90 | HR 96 | Ht 70.0 in | Wt 211.0 lb

## 2016-02-21 DIAGNOSIS — K2270 Barrett's esophagus without dysplasia: Secondary | ICD-10-CM

## 2016-02-21 DIAGNOSIS — Z0000 Encounter for general adult medical examination without abnormal findings: Secondary | ICD-10-CM | POA: Diagnosis not present

## 2016-02-21 DIAGNOSIS — H6123 Impacted cerumen, bilateral: Secondary | ICD-10-CM | POA: Diagnosis not present

## 2016-02-21 DIAGNOSIS — M5440 Lumbago with sciatica, unspecified side: Secondary | ICD-10-CM | POA: Diagnosis not present

## 2016-02-21 DIAGNOSIS — G8929 Other chronic pain: Secondary | ICD-10-CM | POA: Diagnosis not present

## 2016-02-21 DIAGNOSIS — F411 Generalized anxiety disorder: Secondary | ICD-10-CM

## 2016-02-21 MED ORDER — PANTOPRAZOLE SODIUM 40 MG PO TBEC: 40.0000 mg | DELAYED_RELEASE_TABLET | Freq: Every day | ORAL | Status: DC

## 2016-02-21 MED ORDER — OXYCODONE HCL 15 MG PO TABS: 15.0000 mg | ORAL_TABLET | Freq: Four times a day (QID) | ORAL | Status: DC | PRN

## 2016-02-21 NOTE — Assessment & Plan Note (Signed)
Will irrigate 

## 2016-02-21 NOTE — Assessment & Plan Note (Signed)
On Buspar 

## 2016-02-21 NOTE — Progress Notes (Signed)
Pre visit review using our clinic review tool, if applicable. No additional management support is needed unless otherwise documented below in the visit note. 

## 2016-02-21 NOTE — Assessment & Plan Note (Signed)
Chronic Oxycodone   Potential benefits of a long term opioids use as well as potential risks (i.e. addiction risk, apnea etc) and complications (i.e. Somnolence, constipation and others) were explained to the patient and were aknowledged 

## 2016-02-21 NOTE — Assessment & Plan Note (Signed)
Here for medicare wellness/physical  Diet: heart healthy  Physical activity: sedentary  Depression/mood screen: negative  Hearing: intact to whispered voice  Visual acuity: grossly normal, performs annual eye exam  ADLs: capable  Fall risk: none  Home safety: good  Cognitive evaluation: intact to orientation, naming, recall and repetition  EOL planning: adv directives, full code/ I agree  I have personally reviewed and have noted  1. The patient's medical, surgical and social history  2. Their use of alcohol, tobacco or illicit drugs  3. Their current medications and supplements  4. The patient's functional ability including ADL's, fall risks, home safety risks and hearing or visual impairment.  5. Diet and physical activities  6. Evidence for depression or mood disorders 7. Providers roster was reviewed    Today patient counseled on age appropriate routine health concerns for screening and prevention, each reviewed and up to date or declined. Immunizations reviewed and up to date or declined. Labs ordered and reviewed. Risk factors for depression reviewed and negative. Hearing function and visual acuity are intact. ADLs screened and addressed as needed. Functional ability and level of safety reviewed and appropriate. Education, counseling and referrals performed based on assessed risks today. Patient provided with a copy of personalized plan for preventive services.

## 2016-02-21 NOTE — Progress Notes (Signed)
Subjective:  Patient ID: Cody Frazier, male    DOB: 1946-04-16  Age: 70 y.o. MRN: [id number]  CC: No chief complaint on file.   HPI Cody Frazier presents for a well exam SBP 140 at home C/o wt gain LBP is worse  Outpatient Prescriptions Prior to Visit  Medication Sig Dispense Refill  . Alum Hydroxide-Mag Trisilicate (GAVISCON) A999333 MG CHEW Chew by mouth as needed.      Marland Kitchen atorvastatin (LIPITOR) 20 MG tablet Take 1 tablet (20 mg total) by mouth daily. 90 tablet 3  . busPIRone (BUSPAR) 7.5 MG tablet TAKE 1 - 2 TABLETS DAILY AS DIRECTED FOR ANXIETY. 120 tablet 5  . clotrimazole-betamethasone (LOTRISONE) cream Apply topically 2 (two) times daily. 60 g 1  . erythromycin ophthalmic ointment Place 1 application into both eyes 3 (three) times daily. 3.5 g 0  . ferrous sulfate 325 (65 FE) MG tablet Take 325 mg by mouth 2 (two) times daily.      . fluticasone (FLONASE) 50 MCG/ACT nasal spray 2 sprays by Nasal route daily. 16 g 6  . losartan (COZAAR) 25 MG tablet TAKE 1 TABLET (25 MG TOTAL) BY MOUTH 2 (TWO) TIMES DAILY. 180 tablet 3  . LYRICA 50 MG capsule Take 50 mg by mouth 2 (two) times daily.     . metoCLOPramide (REGLAN) 5 MG tablet Take 1 tablet (5 mg total) by mouth every 8 (eight) hours as needed for nausea. 90 tablet 3  . Multiple Vitamins-Minerals (CENTRUM SILVER PO) Take by mouth daily.      . ondansetron (ZOFRAN) 4 MG tablet TAKE 1 TABLET (4 MG TOTAL) BY MOUTH EVERY 8 (EIGHT) HOURS AS NEEDED FOR NAUSEA OR VOMITING. 20 tablet 0  . Polysacch Fe Cmp-Fe Heme Poly (BIFERA) 28 MG TABS Take by mouth daily.      . Probiotic Product (FLORA-Q 2) CAPS Take by mouth 2 (two) times daily.      . promethazine (PHENERGAN) 25 MG tablet Take 1 tablet (25 mg total) by mouth every 6 (six) hours as needed for nausea or vomiting. 60 tablet 0  . psyllium (METAMUCIL) 58.6 % packet Take 1 packet by mouth 2 (two) times daily.      Marland Kitchen triamcinolone cream (KENALOG) 0.5 % Apply 1 application topically 3  (three) times daily. 90 g 1  . doxycycline (VIBRA-TABS) 100 MG tablet Take 1 tablet (100 mg total) by mouth 2 (two) times daily. 20 tablet 0  . oxyCODONE (ROXICODONE) 15 MG immediate release tablet Take 1 tablet (15 mg total) by mouth 4 (four) times daily as needed for pain. For pain - Fill on or after 01/24/16 Please fill every 30 days 120 tablet 0  . pantoprazole (PROTONIX) 40 MG tablet Take 1 tablet (40 mg total) by mouth 2 (two) times daily. (Patient taking differently: Take 40 mg by mouth daily. ) 180 tablet 2   No facility-administered medications prior to visit.    ROS Review of Systems  Constitutional: Positive for unexpected weight change. Negative for appetite change and fatigue.  HENT: Positive for hearing loss. Negative for congestion, nosebleeds, rhinorrhea, sneezing, sore throat and trouble swallowing.   Eyes: Negative for itching and visual disturbance.  Respiratory: Negative for cough.   Cardiovascular: Negative for chest pain, palpitations and leg swelling.  Gastrointestinal: Negative for nausea, diarrhea, blood in stool and abdominal distention.  Genitourinary: Negative for frequency and hematuria.  Musculoskeletal: Positive for back pain. Negative for joint swelling, gait problem and neck pain.  Skin: Negative for rash.  Neurological: Negative for dizziness, tremors, speech difficulty and weakness.  Psychiatric/Behavioral: Negative for suicidal ideas, sleep disturbance, dysphoric mood and agitation. The patient is nervous/anxious.     Objective:  BP 160/90 mmHg  Pulse 96  Ht 5\' 10"  (1.778 m)  Wt 211 lb (95.709 kg)  BMI 30.28 kg/m2  SpO2 95%  BP Readings from Last 3 Encounters:  02/21/16 160/90  11/23/15 138/84  08/26/15 124/84    Wt Readings from Last 3 Encounters:  02/21/16 211 lb (95.709 kg)  11/23/15 204 lb (92.534 kg)  08/26/15 203 lb (92.08 kg)    Physical Exam  Constitutional: He is oriented to person, place, and time. He appears well-developed and  well-nourished. No distress.  HENT:  Head: Normocephalic and atraumatic.  Right Ear: External ear normal.  Left Ear: External ear normal.  Nose: Nose normal.  Mouth/Throat: Oropharynx is clear and moist. No oropharyngeal exudate.  Eyes: Conjunctivae and EOM are normal. Pupils are equal, round, and reactive to light. Right eye exhibits no discharge. Left eye exhibits no discharge. No scleral icterus.  Neck: Normal range of motion. Neck supple. No JVD present. No tracheal deviation present. No thyromegaly present.  Cardiovascular: Normal rate, regular rhythm, normal heart sounds and intact distal pulses.  Exam reveals no gallop and no friction rub.   No murmur heard. Pulmonary/Chest: Effort normal and breath sounds normal. No stridor. No respiratory distress. He has no wheezes. He has no rales. He exhibits no tenderness.  Abdominal: Soft. Bowel sounds are normal. He exhibits no distension and no mass. There is no tenderness. There is no rebound and no guarding.  Genitourinary: Rectum normal, prostate normal and penis normal. Guaiac negative stool. No penile tenderness.  Musculoskeletal: Normal range of motion. He exhibits tenderness. He exhibits no edema.  Lymphadenopathy:    He has no cervical adenopathy.  Neurological: He is alert and oriented to person, place, and time. He has normal reflexes. No cranial nerve deficit. He exhibits normal muscle tone. Coordination abnormal.  Skin: Skin is warm and dry. No rash noted. He is not diaphoretic. No erythema. No pallor.  Psychiatric: He has a normal mood and affect. His behavior is normal. Judgment and thought content normal.  ear wax B LS tender     Procedure Note :     Procedure :  Ear irrigation   Indication:  Cerumen impaction B   Risks, including pain, dizziness, eardrum perforation, bleeding, infection and others as well as benefits were explained to the patient in detail. Verbal consent was obtained and the patient agreed to proceed.      We used "The Elephant Ear Irrigation Device" filled with lukewarm water for irrigation. A large amount wax was recovered. Procedure has also required manual wax removal with an ear loop.   Tolerated well. Complications: None.   Postprocedure instructions :  Call if problems.    Lab Results  Component Value Date   WBC 7.7 02/09/2016   HGB 13.5 02/09/2016   HCT 39.6 02/09/2016   PLT 344.0 02/09/2016   GLUCOSE 119* 02/09/2016   CHOL 199 02/09/2016   TRIG 186.0* 02/09/2016   HDL 50.20 02/09/2016   LDLDIRECT 171.0 05/20/2015   LDLCALC 112* 02/09/2016   ALT 16 02/09/2016   AST 17 02/09/2016   NA 141 02/09/2016   K 4.8 02/09/2016   CL 104 02/09/2016   CREATININE 1.12 02/09/2016   BUN 22 02/09/2016   CO2 27 02/09/2016   TSH 0.76 02/09/2016  PSA 0.12 02/09/2016   INR 1.24 04/17/2010   HGBA1C 5.6 02/24/2015    Dg Chest 2 View  08/26/2015  CLINICAL DATA:  Cough, fever. EXAM: CHEST  2 VIEW COMPARISON:  November 20, 2013. FINDINGS: The heart size and mediastinal contours are within normal limits. Left lung is clear. Minimal right basilar subsegmental atelectasis is noted. No pneumothorax or pleural effusion is noted. The visualized skeletal structures are unremarkable. IMPRESSION: Minimal right basilar subsegmental atelectasis. Electronically Signed   By: Marijo Conception, M.D.   On: 08/26/2015 13:38    Assessment & Plan:   Diagnoses and all orders for this visit:  Well adult exam  Anxiety state  Chronic low back pain with sciatica, sciatica laterality unspecified, unspecified back pain laterality  Barrett's esophagus without dysplasia  Cerumen impaction, bilateral  Other orders -     Discontinue: oxyCODONE (ROXICODONE) 15 MG immediate release tablet; Take 1 tablet (15 mg total) by mouth 4 (four) times daily as needed for pain. For pain - Fill on or after 02/24/16 Please fill every 30 days -     pantoprazole (PROTONIX) 40 MG tablet; Take 1 tablet (40 mg total) by mouth  daily. -     Discontinue: oxyCODONE (ROXICODONE) 15 MG immediate release tablet; Take 1 tablet (15 mg total) by mouth 4 (four) times daily as needed for pain. For pain - Fill on or after 03/25/16 Please fill every 30 days -     oxyCODONE (ROXICODONE) 15 MG immediate release tablet; Take 1 tablet (15 mg total) by mouth 4 (four) times daily as needed for pain. For pain - Fill on or after 04/25/16 Please fill every 30 days  I have discontinued Mr. Roshto doxycycline, oxyCODONE, and oxyCODONE. I have also changed his pantoprazole and oxyCODONE. Additionally, I am having him maintain his BIFERA, Multiple Vitamins-Minerals (CENTRUM SILVER PO), ferrous sulfate, FLORA-Q 2, Alum Hydroxide-Mag Trisilicate, psyllium, fluticasone, LYRICA, clotrimazole-betamethasone, triamcinolone cream, promethazine, busPIRone, atorvastatin, erythromycin, metoCLOPramide, ondansetron, and losartan.  Meds ordered this encounter  Medications  . DISCONTD: oxyCODONE (ROXICODONE) 15 MG immediate release tablet    Sig: Take 1 tablet (15 mg total) by mouth 4 (four) times daily as needed for pain. For pain - Fill on or after 02/24/16 Please fill every 30 days    Dispense:  120 tablet    Refill:  0  . pantoprazole (PROTONIX) 40 MG tablet    Sig: Take 1 tablet (40 mg total) by mouth daily.    Dispense:  90 tablet    Refill:  3  . DISCONTD: oxyCODONE (ROXICODONE) 15 MG immediate release tablet    Sig: Take 1 tablet (15 mg total) by mouth 4 (four) times daily as needed for pain. For pain - Fill on or after 03/25/16 Please fill every 30 days    Dispense:  120 tablet    Refill:  0  . oxyCODONE (ROXICODONE) 15 MG immediate release tablet    Sig: Take 1 tablet (15 mg total) by mouth 4 (four) times daily as needed for pain. For pain - Fill on or after 04/25/16 Please fill every 30 days    Dispense:  120 tablet    Refill:  0     Follow-up: Return in about 3 months (around 05/22/2016) for a follow-up visit.  Walker Kehr, MD

## 2016-02-21 NOTE — Assessment & Plan Note (Signed)
Long-standing Pantoprazole EGD q 5 years

## 2016-03-07 ENCOUNTER — Other Ambulatory Visit: Payer: Self-pay | Admitting: Internal Medicine

## 2016-03-08 ENCOUNTER — Other Ambulatory Visit: Payer: Self-pay | Admitting: Internal Medicine

## 2016-05-22 ENCOUNTER — Ambulatory Visit (INDEPENDENT_AMBULATORY_CARE_PROVIDER_SITE_OTHER): Payer: Medicare Other | Admitting: Internal Medicine

## 2016-05-22 ENCOUNTER — Encounter: Payer: Self-pay | Admitting: Internal Medicine

## 2016-05-22 ENCOUNTER — Other Ambulatory Visit (INDEPENDENT_AMBULATORY_CARE_PROVIDER_SITE_OTHER): Payer: Medicare Other

## 2016-05-22 VITALS — BP 140/88 | HR 79 | Wt 214.0 lb

## 2016-05-22 DIAGNOSIS — I10 Essential (primary) hypertension: Secondary | ICD-10-CM | POA: Diagnosis not present

## 2016-05-22 DIAGNOSIS — M5440 Lumbago with sciatica, unspecified side: Secondary | ICD-10-CM | POA: Diagnosis not present

## 2016-05-22 DIAGNOSIS — G8929 Other chronic pain: Secondary | ICD-10-CM

## 2016-05-22 DIAGNOSIS — K2270 Barrett's esophagus without dysplasia: Secondary | ICD-10-CM | POA: Diagnosis not present

## 2016-05-22 DIAGNOSIS — R7309 Other abnormal glucose: Secondary | ICD-10-CM

## 2016-05-22 DIAGNOSIS — R5382 Chronic fatigue, unspecified: Secondary | ICD-10-CM

## 2016-05-22 DIAGNOSIS — F411 Generalized anxiety disorder: Secondary | ICD-10-CM

## 2016-05-22 LAB — BASIC METABOLIC PANEL
BUN: 19 mg/dL (ref 6–23)
CO2: 27 mEq/L (ref 19–32)
Calcium: 9.9 mg/dL (ref 8.4–10.5)
Chloride: 104 mEq/L (ref 96–112)
Creatinine, Ser: 1.13 mg/dL (ref 0.40–1.50)
GFR: 68.05 mL/min (ref >60.00)
Glucose, Bld: 107 mg/dL — ABNORMAL HIGH (ref 70–99)
Potassium: 4.7 mEq/L (ref 3.5–5.1)
Sodium: 140 mEq/L (ref 135–145)

## 2016-05-22 LAB — HEMOGLOBIN A1C: Hgb A1c MFr Bld: 5.4 % (ref 4.6–6.5)

## 2016-05-22 MED ORDER — OXYCODONE HCL 15 MG PO TABS: 15.0000 mg | ORAL_TABLET | Freq: Four times a day (QID) | ORAL | Status: DC | PRN

## 2016-05-22 NOTE — Assessment & Plan Note (Signed)
On Buspar 

## 2016-05-22 NOTE — Assessment & Plan Note (Addendum)
Oxycodone   Potential benefits of a long term opioids use as well as potential risks (i.e. addiction risk, apnea etc) and complications (i.e. Somnolence, constipation and others) were explained to the patient and were aknowledged. Wt loss discussed

## 2016-05-22 NOTE — Assessment & Plan Note (Signed)
Not using testosterone

## 2016-05-22 NOTE — Progress Notes (Signed)
Pre visit review using our clinic review tool, if applicable. No additional management support is needed unless otherwise documented below in the visit note. 

## 2016-05-22 NOTE — Progress Notes (Signed)
Subjective:  Patient ID: Cody Frazier, male    DOB: [birth year]  Age: 70 y.o. MRN: [id number]  CC: No chief complaint on file.   HPI Cody Frazier presents for LBP, CAD, dyslipidemia, PUD f/u. Drinks a lot of sweet tea - wt gain  Outpatient Prescriptions Prior to Visit  Medication Sig Dispense Refill  . Alum Hydroxide-Mag Trisilicate (GAVISCON) A999333 MG CHEW Chew by mouth as needed.      Marland Kitchen atorvastatin (LIPITOR) 20 MG tablet Take 1 tablet (20 mg total) by mouth daily. 90 tablet 3  . busPIRone (BUSPAR) 7.5 MG tablet TAKE 1 - 2 TABLETS DAILY AS DIRECTED FOR ANXIETY. 120 tablet 5  . clotrimazole-betamethasone (LOTRISONE) cream Apply topically 2 (two) times daily. 60 g 1  . erythromycin ophthalmic ointment Place 1 application into both eyes 3 (three) times daily. 3.5 g 0  . ferrous sulfate 325 (65 FE) MG tablet Take 325 mg by mouth 2 (two) times daily.      . fluticasone (FLONASE) 50 MCG/ACT nasal spray 2 sprays by Nasal route daily. 16 g 6  . losartan (COZAAR) 25 MG tablet TAKE 1 TABLET (25 MG TOTAL) BY MOUTH 2 (TWO) TIMES DAILY. 180 tablet 3  . LYRICA 50 MG capsule Take 50 mg by mouth 2 (two) times daily.     . metoCLOPramide (REGLAN) 5 MG tablet TAKE 1 TABLET (5 MG TOTAL) BY MOUTH EVERY 8 (EIGHT) HOURS AS NEEDED FOR NAUSEA. 90 tablet 3  . Multiple Vitamins-Minerals (CENTRUM SILVER PO) Take by mouth daily.      . ondansetron (ZOFRAN) 4 MG tablet TAKE 1 TABLET (4 MG TOTAL) BY MOUTH EVERY 8 (EIGHT) HOURS AS NEEDED FOR NAUSEA OR VOMITING. 20 tablet 0  . oxyCODONE (ROXICODONE) 15 MG immediate release tablet Take 1 tablet (15 mg total) by mouth 4 (four) times daily as needed for pain. For pain - Fill on or after 04/25/16 Please fill every 30 days 120 tablet 0  . pantoprazole (PROTONIX) 40 MG tablet Take 1 tablet (40 mg total) by mouth daily. 90 tablet 3  . Polysacch Fe Cmp-Fe Heme Poly (BIFERA) 28 MG TABS Take by mouth daily.      . Probiotic Product (FLORA-Q 2) CAPS Take by mouth 2 (two)  times daily.      . promethazine (PHENERGAN) 25 MG tablet Take 1 tablet (25 mg total) by mouth every 6 (six) hours as needed for nausea or vomiting. 60 tablet 0  . psyllium (METAMUCIL) 58.6 % packet Take 1 packet by mouth 2 (two) times daily.      Marland Kitchen triamcinolone cream (KENALOG) 0.5 % Apply 1 application topically 3 (three) times daily. 90 g 1   No facility-administered medications prior to visit.    ROS Review of Systems  Constitutional: Positive for unexpected weight change. Negative for appetite change and fatigue.  HENT: Negative for congestion, nosebleeds, sneezing, sore throat and trouble swallowing.   Eyes: Negative for itching and visual disturbance.  Respiratory: Negative for cough.   Cardiovascular: Negative for chest pain, palpitations and leg swelling.  Gastrointestinal: Negative for nausea, diarrhea, blood in stool and abdominal distention.  Genitourinary: Negative for frequency and hematuria.  Musculoskeletal: Positive for back pain and gait problem. Negative for joint swelling and neck pain.  Skin: Negative for rash.  Neurological: Negative for dizziness, tremors, speech difficulty and weakness.  Psychiatric/Behavioral: Negative for sleep disturbance, dysphoric mood and agitation. The patient is not nervous/anxious.     Objective:  BP 140/88 mmHg  Pulse 79  Wt 214 lb (97.07 kg)  SpO2 95%  BP Readings from Last 3 Encounters:  05/22/16 140/88  02/21/16 160/90  11/23/15 138/84    Wt Readings from Last 3 Encounters:  05/22/16 214 lb (97.07 kg)  02/21/16 211 lb (95.709 kg)  11/23/15 204 lb (92.534 kg)    Physical Exam  Constitutional: He is oriented to person, place, and time. He appears well-developed. No distress.  NAD  HENT:  Mouth/Throat: Oropharynx is clear and moist.  Eyes: Conjunctivae are normal. Pupils are equal, round, and reactive to light.  Neck: Normal range of motion. No JVD present. No thyromegaly present.  Cardiovascular: Normal rate, regular  rhythm, normal heart sounds and intact distal pulses.  Exam reveals no gallop and no friction rub.   No murmur heard. Pulmonary/Chest: Effort normal and breath sounds normal. No respiratory distress. He has no wheezes. He has no rales. He exhibits no tenderness.  Abdominal: Soft. Bowel sounds are normal. He exhibits no distension and no mass. There is no tenderness. There is no rebound and no guarding.  Musculoskeletal: Normal range of motion. He exhibits tenderness. He exhibits no edema.  Lymphadenopathy:    He has no cervical adenopathy.  Neurological: He is alert and oriented to person, place, and time. He has normal reflexes. No cranial nerve deficit. He exhibits normal muscle tone. He displays a negative Romberg sign. Coordination and gait normal.  Skin: Skin is warm and dry. No rash noted.  Psychiatric: He has a normal mood and affect. His behavior is normal. Judgment and thought content normal.  Obese LS tender  Lab Results  Component Value Date   WBC 7.7 02/09/2016   HGB 13.5 02/09/2016   HCT 39.6 02/09/2016   PLT 344.0 02/09/2016   GLUCOSE 119* 02/09/2016   CHOL 199 02/09/2016   TRIG 186.0* 02/09/2016   HDL 50.20 02/09/2016   LDLDIRECT 171.0 05/20/2015   LDLCALC 112* 02/09/2016   ALT 16 02/09/2016   AST 17 02/09/2016   NA 141 02/09/2016   K 4.8 02/09/2016   CL 104 02/09/2016   CREATININE 1.12 02/09/2016   BUN 22 02/09/2016   CO2 27 02/09/2016   TSH 0.76 02/09/2016   PSA 0.12 02/09/2016   INR 1.24 04/17/2010   HGBA1C 5.6 02/24/2015    Dg Chest 2 View  08/26/2015  CLINICAL DATA:  Cough, fever. EXAM: CHEST  2 VIEW COMPARISON:  November 20, 2013. FINDINGS: The heart size and mediastinal contours are within normal limits. Left lung is clear. Minimal right basilar subsegmental atelectasis is noted. No pneumothorax or pleural effusion is noted. The visualized skeletal structures are unremarkable. IMPRESSION: Minimal right basilar subsegmental atelectasis. Electronically  Signed   By: Marijo Conception, M.D.   On: 08/26/2015 13:38    Assessment & Plan:   There are no diagnoses linked to this encounter. I am having Mr. Corneau maintain his BIFERA, Multiple Vitamins-Minerals (CENTRUM SILVER PO), ferrous sulfate, FLORA-Q 2, Alum Hydroxide-Mag Trisilicate, psyllium, fluticasone, LYRICA, clotrimazole-betamethasone, triamcinolone cream, promethazine, atorvastatin, erythromycin, ondansetron, losartan, pantoprazole, oxyCODONE, busPIRone, and metoCLOPramide.  No orders of the defined types were placed in this encounter.     Follow-up: No Follow-up on file.  Walker Kehr, MD

## 2016-05-22 NOTE — Assessment & Plan Note (Signed)
On Losartan 

## 2016-05-22 NOTE — Patient Instructions (Signed)
Fresh tea Agave syrup

## 2016-05-22 NOTE — Assessment & Plan Note (Signed)
Pantoprazole EGD q 5 years

## 2016-06-12 ENCOUNTER — Other Ambulatory Visit (INDEPENDENT_AMBULATORY_CARE_PROVIDER_SITE_OTHER): Payer: Medicare Other

## 2016-06-12 ENCOUNTER — Encounter: Payer: Self-pay | Admitting: Internal Medicine

## 2016-06-12 ENCOUNTER — Ambulatory Visit (INDEPENDENT_AMBULATORY_CARE_PROVIDER_SITE_OTHER): Payer: Medicare Other | Admitting: Internal Medicine

## 2016-06-12 VITALS — BP 120/82 | HR 114 | Temp 97.9°F | Wt 214.0 lb

## 2016-06-12 DIAGNOSIS — R000 Tachycardia, unspecified: Secondary | ICD-10-CM

## 2016-06-12 DIAGNOSIS — R0789 Other chest pain: Secondary | ICD-10-CM

## 2016-06-12 DIAGNOSIS — R0609 Other forms of dyspnea: Secondary | ICD-10-CM

## 2016-06-12 DIAGNOSIS — T148XXA Other injury of unspecified body region, initial encounter: Secondary | ICD-10-CM

## 2016-06-12 DIAGNOSIS — T148 Other injury of unspecified body region: Secondary | ICD-10-CM

## 2016-06-12 DIAGNOSIS — L608 Other nail disorders: Secondary | ICD-10-CM | POA: Diagnosis not present

## 2016-06-12 DIAGNOSIS — R0600 Dyspnea, unspecified: Secondary | ICD-10-CM

## 2016-06-12 LAB — CBC WITH DIFFERENTIAL/PLATELET
Basophils Absolute: 0 10*3/uL (ref 0.0–0.1)
Basophils Relative: 0.3 % (ref 0.0–3.0)
Eosinophils Absolute: 0.2 10*3/uL (ref 0.0–0.7)
Eosinophils Relative: 2.8 % (ref 0.0–5.0)
HCT: 40.3 % (ref 39.0–52.0)
Hemoglobin: 13.5 g/dL (ref 13.0–17.0)
Lymphocytes Relative: 19.6 % (ref 12.0–46.0)
Lymphs Abs: 1.7 10*3/uL (ref 0.7–4.0)
MCHC: 33.5 g/dL (ref 30.0–36.0)
MCV: 92 fl (ref 78.0–100.0)
Monocytes Absolute: 0.6 10*3/uL (ref 0.1–1.0)
Monocytes Relative: 7 % (ref 3.0–12.0)
Neutro Abs: 6 10*3/uL (ref 1.4–7.7)
Neutrophils Relative %: 70.3 % (ref 43.0–77.0)
Platelets: 343 10*3/uL (ref 150.0–400.0)
RBC: 4.38 Mil/uL (ref 4.22–5.81)
RDW: 13.1 % (ref 11.5–15.5)
WBC: 8.5 10*3/uL (ref 4.0–10.5)

## 2016-06-12 LAB — URINALYSIS, ROUTINE W REFLEX MICROSCOPIC
Bilirubin Urine: NEGATIVE
Hgb urine dipstick: NEGATIVE
Ketones, ur: NEGATIVE
Leukocytes, UA: NEGATIVE
Nitrite: NEGATIVE
RBC / HPF: NONE SEEN (ref 0–?)
Specific Gravity, Urine: 1.01 (ref 1.000–1.030)
Total Protein, Urine: NEGATIVE
Urine Glucose: NEGATIVE
Urobilinogen, UA: 0.2 (ref 0.0–1.0)
pH: 6 (ref 5.0–8.0)

## 2016-06-12 LAB — PROTIME-INR
INR: 1 ratio (ref 0.8–1.0)
Prothrombin Time: 10.7 s (ref 9.6–13.1)

## 2016-06-12 NOTE — Patient Instructions (Signed)
Wider shoes

## 2016-06-12 NOTE — Progress Notes (Signed)
Pre visit review using our clinic review tool, if applicable. No additional management support is needed unless otherwise documented below in the visit note. 

## 2016-06-12 NOTE — Progress Notes (Signed)
Subjective:  Patient ID: Cody Frazier, male    DOB: March 02, 1946  Age: 70 y.o. MRN: [id number]  CC: Nail Problem (both 1st toenails turned purple 1 week ago)   HPI Cody Frazier presents for discolored toenails x 1 week - no pain. C/o frequent urination. C/o wt gain and SOB going up and down steps x 2 weeks. C/o DOE x 2 wks - cleaning the house  Outpatient Medications Prior to Visit  Medication Sig Dispense Refill  . Alum Hydroxide-Mag Trisilicate (GAVISCON) A999333 MG CHEW Chew by mouth as needed.      Marland Kitchen atorvastatin (LIPITOR) 20 MG tablet Take 1 tablet (20 mg total) by mouth daily. 90 tablet 3  . busPIRone (BUSPAR) 7.5 MG tablet TAKE 1 - 2 TABLETS DAILY AS DIRECTED FOR ANXIETY. 120 tablet 5  . clotrimazole-betamethasone (LOTRISONE) cream Apply topically 2 (two) times daily. 60 g 1  . erythromycin ophthalmic ointment Place 1 application into both eyes 3 (three) times daily. 3.5 g 0  . ferrous sulfate 325 (65 FE) MG tablet Take 325 mg by mouth 2 (two) times daily.      . fluticasone (FLONASE) 50 MCG/ACT nasal spray 2 sprays by Nasal route daily. 16 g 6  . losartan (COZAAR) 25 MG tablet TAKE 1 TABLET (25 MG TOTAL) BY MOUTH 2 (TWO) TIMES DAILY. 180 tablet 3  . LYRICA 50 MG capsule Take 50 mg by mouth 2 (two) times daily.     . metoCLOPramide (REGLAN) 5 MG tablet TAKE 1 TABLET (5 MG TOTAL) BY MOUTH EVERY 8 (EIGHT) HOURS AS NEEDED FOR NAUSEA. 90 tablet 3  . Multiple Vitamins-Minerals (CENTRUM SILVER PO) Take by mouth daily.      . ondansetron (ZOFRAN) 4 MG tablet TAKE 1 TABLET (4 MG TOTAL) BY MOUTH EVERY 8 (EIGHT) HOURS AS NEEDED FOR NAUSEA OR VOMITING. 20 tablet 0  . oxyCODONE (ROXICODONE) 15 MG immediate release tablet Take 1 tablet (15 mg total) by mouth 4 (four) times daily as needed for pain. For pain - Fill on or after 07/26/16 Please fill every 30 days 120 tablet 0  . pantoprazole (PROTONIX) 40 MG tablet Take 1 tablet (40 mg total) by mouth daily. 90 tablet 3  . Polysacch Fe Cmp-Fe  Heme Poly (BIFERA) 28 MG TABS Take by mouth daily.      . Probiotic Product (FLORA-Q 2) CAPS Take by mouth 2 (two) times daily.      . promethazine (PHENERGAN) 25 MG tablet Take 1 tablet (25 mg total) by mouth every 6 (six) hours as needed for nausea or vomiting. 60 tablet 0  . psyllium (METAMUCIL) 58.6 % packet Take 1 packet by mouth 2 (two) times daily.      Marland Kitchen triamcinolone cream (KENALOG) 0.5 % Apply 1 application topically 3 (three) times daily. 90 g 1   No facility-administered medications prior to visit.     ROS Review of Systems  Constitutional: Positive for unexpected weight change. Negative for appetite change and fatigue.  HENT: Negative for congestion, nosebleeds, sneezing, sore throat and trouble swallowing.   Eyes: Negative for itching and visual disturbance.  Respiratory: Positive for shortness of breath. Negative for cough.   Cardiovascular: Negative for chest pain, palpitations and leg swelling.  Gastrointestinal: Negative for abdominal distention, blood in stool, diarrhea and nausea.  Genitourinary: Negative for frequency and hematuria.  Musculoskeletal: Positive for back pain. Negative for gait problem, joint swelling and neck pain.  Skin: Positive for color change. Negative for rash.  Neurological: Negative for dizziness, tremors, speech difficulty and weakness.  Psychiatric/Behavioral: Negative for agitation, dysphoric mood and sleep disturbance. The patient is not nervous/anxious.     Objective:  BP 120/82   Pulse (!) 114   Temp 97.9 F (36.6 C) (Oral)   Wt 214 lb (97.1 kg)   SpO2 95%   BMI 30.71 kg/m   BP Readings from Last 3 Encounters:  06/12/16 120/82  05/22/16 140/88  02/21/16 (!) 160/90    Wt Readings from Last 3 Encounters:  06/12/16 214 lb (97.1 kg)  05/22/16 214 lb (97.1 kg)  02/21/16 211 lb (95.7 kg)    Physical Exam  Constitutional: He is oriented to person, place, and time. He appears well-developed. No distress.  NAD  HENT:    Mouth/Throat: Oropharynx is clear and moist.  Eyes: Conjunctivae are normal. Pupils are equal, round, and reactive to light.  Neck: Normal range of motion. No JVD present. No thyromegaly present.  Cardiovascular: Normal rate, regular rhythm, normal heart sounds and intact distal pulses.  Exam reveals no gallop and no friction rub.   No murmur heard. Pulmonary/Chest: Effort normal and breath sounds normal. No respiratory distress. He has no wheezes. He has no rales. He exhibits no tenderness.  Abdominal: Soft. Bowel sounds are normal. He exhibits no distension and no mass. There is no tenderness. There is no rebound and no guarding.  Musculoskeletal: Normal range of motion. He exhibits no edema or tenderness.  Lymphadenopathy:    He has no cervical adenopathy.  Neurological: He is alert and oriented to person, place, and time. He has normal reflexes. No cranial nerve deficit. He exhibits normal muscle tone. He displays a negative Romberg sign. Coordination and gait normal.  Skin: Skin is warm and dry. No rash noted.  Psychiatric: He has a normal mood and affect. His behavior is normal. Judgment and thought content normal.   B big toenails w/ecchymoses Obese  Lab Results  Component Value Date   WBC 7.7 02/09/2016   HGB 13.5 02/09/2016   HCT 39.6 02/09/2016   PLT 344.0 02/09/2016   GLUCOSE 107 (H) 05/22/2016   CHOL 199 02/09/2016   TRIG 186.0 (H) 02/09/2016   HDL 50.20 02/09/2016   LDLDIRECT 171.0 05/20/2015   LDLCALC 112 (H) 02/09/2016   ALT 16 02/09/2016   AST 17 02/09/2016   NA 140 05/22/2016   K 4.7 05/22/2016   CL 104 05/22/2016   CREATININE 1.13 05/22/2016   BUN 19 05/22/2016   CO2 27 05/22/2016   TSH 0.76 02/09/2016   PSA 0.12 02/09/2016   INR 1.24 04/17/2010   HGBA1C 5.4 05/22/2016   Procedure: EKG Indication: DOE Impression: S tachy. No acute changes.  Dg Chest 2 View  Result Date: 08/26/2015 CLINICAL DATA:  Cough, fever. EXAM: CHEST  2 VIEW COMPARISON:   November 20, 2013. FINDINGS: The heart size and mediastinal contours are within normal limits. Left lung is clear. Minimal right basilar subsegmental atelectasis is noted. No pneumothorax or pleural effusion is noted. The visualized skeletal structures are unremarkable. IMPRESSION: Minimal right basilar subsegmental atelectasis. Electronically Signed   By: Marijo Conception, M.D.   On: 08/26/2015 13:38    Assessment & Plan:   Eesa was seen today for nail problem.  Diagnoses and all orders for this visit:  Discoloration of nailbeds   I am having Mr. Detoro maintain his BIFERA, Multiple Vitamins-Minerals (CENTRUM SILVER PO), ferrous sulfate, FLORA-Q 2, Alum Hydroxide-Mag Trisilicate, psyllium, fluticasone, LYRICA, clotrimazole-betamethasone, triamcinolone cream, promethazine, atorvastatin,  erythromycin, ondansetron, losartan, pantoprazole, busPIRone, metoCLOPramide, and oxyCODONE.  No orders of the defined types were placed in this encounter.    Follow-up: No Follow-up on file.  Walker Kehr, MD

## 2016-06-12 NOTE — Assessment & Plan Note (Addendum)
B big toenails w/ecchymoses ?etiology - likely mechanical bruising CBC, INR, UA Wider shoes

## 2016-06-12 NOTE — Assessment & Plan Note (Signed)
EKG CL stress test 

## 2016-07-04 ENCOUNTER — Encounter (HOSPITAL_COMMUNITY): Payer: Medicare Other

## 2016-07-06 ENCOUNTER — Telehealth (HOSPITAL_COMMUNITY): Payer: Self-pay | Admitting: *Deleted

## 2016-07-06 NOTE — Telephone Encounter (Signed)
Close encounter 

## 2016-07-11 ENCOUNTER — Ambulatory Visit (HOSPITAL_COMMUNITY)
Admission: RE | Admit: 2016-07-11 | Discharge: 2016-07-11 | Disposition: A | Discharge status: Home / Self Care | Payer: Medicare Other | Source: Ambulatory Visit | Attending: Internal Medicine | Admitting: Internal Medicine

## 2016-07-11 DIAGNOSIS — R0600 Dyspnea, unspecified: Secondary | ICD-10-CM

## 2016-07-11 DIAGNOSIS — R000 Tachycardia, unspecified: Secondary | ICD-10-CM

## 2016-07-11 DIAGNOSIS — R0609 Other forms of dyspnea: Secondary | ICD-10-CM

## 2016-07-11 DIAGNOSIS — R0789 Other chest pain: Secondary | ICD-10-CM

## 2016-07-18 ENCOUNTER — Other Ambulatory Visit: Payer: Self-pay | Admitting: Internal Medicine

## 2016-07-18 ENCOUNTER — Telehealth (HOSPITAL_COMMUNITY): Payer: Self-pay

## 2016-07-18 NOTE — Telephone Encounter (Signed)
Encounter complete. 

## 2016-07-19 ENCOUNTER — Telehealth (HOSPITAL_COMMUNITY): Payer: Self-pay

## 2016-07-19 NOTE — Telephone Encounter (Signed)
Encounter complete. 

## 2016-07-20 ENCOUNTER — Ambulatory Visit (HOSPITAL_COMMUNITY)
Admission: RE | Admit: 2016-07-20 | Discharge: 2016-07-20 | Disposition: A | Discharge status: Home / Self Care | Payer: Medicare Other | Source: Ambulatory Visit | Attending: Internal Medicine | Admitting: Internal Medicine

## 2016-07-20 DIAGNOSIS — R000 Tachycardia, unspecified: Secondary | ICD-10-CM

## 2016-07-20 DIAGNOSIS — E669 Obesity, unspecified: Secondary | ICD-10-CM | POA: Diagnosis not present

## 2016-07-20 DIAGNOSIS — I10 Essential (primary) hypertension: Secondary | ICD-10-CM | POA: Insufficient documentation

## 2016-07-20 DIAGNOSIS — Z87891 Personal history of nicotine dependence: Secondary | ICD-10-CM | POA: Diagnosis not present

## 2016-07-20 DIAGNOSIS — R5383 Other fatigue: Secondary | ICD-10-CM | POA: Insufficient documentation

## 2016-07-20 DIAGNOSIS — Z6830 Body mass index (BMI) 30.0-30.9, adult: Secondary | ICD-10-CM | POA: Insufficient documentation

## 2016-07-20 DIAGNOSIS — R0789 Other chest pain: Secondary | ICD-10-CM | POA: Insufficient documentation

## 2016-07-20 DIAGNOSIS — R0609 Other forms of dyspnea: Secondary | ICD-10-CM

## 2016-07-20 LAB — MYOCARDIAL PERFUSION IMAGING
Peak HR: 111 {beats}/min
Rest HR: 86 {beats}/min

## 2016-07-20 MED ORDER — TECHNETIUM TC 99M TETROFOSMIN IV KIT
10.5000 | PACK | Freq: Once | INTRAVENOUS | Status: AC | PRN
  Administered 2016-07-20: 11 via INTRAVENOUS
  Filled 2016-07-20: qty 11

## 2016-07-20 MED ORDER — REGADENOSON 0.4 MG/5ML IV SOLN
0.4000 mg | Freq: Once | INTRAVENOUS | Status: AC
  Administered 2016-07-20: 0.4 mg via INTRAVENOUS

## 2016-07-20 MED ORDER — TECHNETIUM TC 99M TETROFOSMIN IV KIT
31.7000 | PACK | Freq: Once | INTRAVENOUS | Status: AC | PRN
  Administered 2016-07-20: 31.7 via INTRAVENOUS
  Filled 2016-07-20: qty 32

## 2016-08-22 ENCOUNTER — Encounter: Payer: Self-pay | Admitting: Internal Medicine

## 2016-08-22 ENCOUNTER — Ambulatory Visit (INDEPENDENT_AMBULATORY_CARE_PROVIDER_SITE_OTHER): Payer: Medicare Other | Admitting: Internal Medicine

## 2016-08-22 VITALS — BP 120/80 | HR 87 | Temp 98.9°F | Wt 214.0 lb

## 2016-08-22 DIAGNOSIS — K219 Gastro-esophageal reflux disease without esophagitis: Secondary | ICD-10-CM

## 2016-08-22 DIAGNOSIS — Z23 Encounter for immunization: Secondary | ICD-10-CM

## 2016-08-22 DIAGNOSIS — I10 Essential (primary) hypertension: Secondary | ICD-10-CM | POA: Diagnosis not present

## 2016-08-22 DIAGNOSIS — M5440 Lumbago with sciatica, unspecified side: Secondary | ICD-10-CM | POA: Diagnosis not present

## 2016-08-22 DIAGNOSIS — G8929 Other chronic pain: Secondary | ICD-10-CM

## 2016-08-22 MED ORDER — OXYCODONE HCL 15 MG PO TABS: 15.0000 mg | ORAL_TABLET | Freq: Four times a day (QID) | ORAL | 0 refills | Status: DC | PRN

## 2016-08-22 MED ORDER — CLOTRIMAZOLE-BETAMETHASONE 1-0.05 % EX CREA: TOPICAL_CREAM | Freq: Two times a day (BID) | CUTANEOUS | 1 refills | Status: DC

## 2016-08-22 NOTE — Progress Notes (Signed)
Subjective:  Patient ID: Cody Frazier, male    DOB: 1946/02/14  Age: 70 y.o. MRN: [id number]  CC: No chief complaint on file.   HPI Cody Frazier presents for aLBP, GERD, CAD, dyslipidemia f/u  Outpatient Medications Prior to Visit  Medication Sig Dispense Refill  . Alum Hydroxide-Mag Trisilicate (GAVISCON) [id number] MG CHEW Chew by mouth as needed.      Marland Kitchen atorvastatin (LIPITOR) 20 MG tablet TAKE 1 TABLET BY MOUTH EVERY DAY 90 tablet 3  . busPIRone (BUSPAR) 7.5 MG tablet TAKE 1 - 2 TABLETS DAILY AS DIRECTED FOR ANXIETY. 120 tablet 5  . clotrimazole-betamethasone (LOTRISONE) cream Apply topically 2 (two) times daily. 60 g 1  . erythromycin ophthalmic ointment Place 1 application into both eyes 3 (three) times daily. 3.5 g 0  . ferrous sulfate 325 (65 FE) MG tablet Take 325 mg by mouth 2 (two) times daily.      . fluticasone (FLONASE) 50 MCG/ACT nasal spray 2 sprays by Nasal route daily. 16 g 6  . losartan (COZAAR) 25 MG tablet TAKE 1 TABLET (25 MG TOTAL) BY MOUTH 2 (TWO) TIMES DAILY. 180 tablet 3  . LYRICA 50 MG capsule Take 50 mg by mouth 2 (two) times daily.     . metoCLOPramide (REGLAN) 5 MG tablet TAKE 1 TABLET (5 MG TOTAL) BY MOUTH EVERY 8 (EIGHT) HOURS AS NEEDED FOR NAUSEA. 90 tablet 3  . Multiple Vitamins-Minerals (CENTRUM SILVER PO) Take by mouth daily.      . ondansetron (ZOFRAN) 4 MG tablet TAKE 1 TABLET (4 MG TOTAL) BY MOUTH EVERY 8 (EIGHT) HOURS AS NEEDED FOR NAUSEA OR VOMITING. 20 tablet 0  . oxyCODONE (ROXICODONE) 15 MG immediate release tablet Take 1 tablet (15 mg total) by mouth 4 (four) times daily as needed for pain. For pain - Fill on or after 07/26/16 Please fill every 30 days 120 tablet 0  . pantoprazole (PROTONIX) 40 MG tablet Take 1 tablet (40 mg total) by mouth daily. 90 tablet 3  . Polysacch Fe Cmp-Fe Heme Poly (BIFERA) 28 MG TABS Take by mouth daily.      . Probiotic Product (FLORA-Q 2) CAPS Take by mouth 2 (two) times daily.      . promethazine (PHENERGAN)  25 MG tablet Take 1 tablet (25 mg total) by mouth every 6 (six) hours as needed for nausea or vomiting. 60 tablet 0  . psyllium (METAMUCIL) 58.6 % packet Take 1 packet by mouth 2 (two) times daily.      Marland Kitchen triamcinolone cream (KENALOG) 0.5 % Apply 1 application topically 3 (three) times daily. 90 g 1   No facility-administered medications prior to visit.     ROS Review of Systems  Constitutional: Negative for appetite change, fatigue and unexpected weight change.  HENT: Negative for congestion, nosebleeds, sneezing, sore throat and trouble swallowing.   Eyes: Negative for itching and visual disturbance.  Respiratory: Negative for cough.   Cardiovascular: Negative for chest pain, palpitations and leg swelling.  Gastrointestinal: Negative for abdominal distention, blood in stool, diarrhea and nausea.  Genitourinary: Negative for frequency and hematuria.  Musculoskeletal: Positive for back pain. Negative for gait problem, joint swelling and neck pain.  Skin: Negative for rash.  Neurological: Negative for dizziness, tremors, speech difficulty and weakness.  Psychiatric/Behavioral: Negative for agitation, dysphoric mood, sleep disturbance and suicidal ideas. The patient is not nervous/anxious.     Objective:  BP 120/80   Pulse 87   Temp 98.9 F (37.2 C) (Oral)  Wt 214 lb (97.1 kg)   SpO2 96%   BMI 30.71 kg/m   BP Readings from Last 3 Encounters:  08/22/16 120/80  06/12/16 120/82  05/22/16 140/88    Wt Readings from Last 3 Encounters:  08/22/16 214 lb (97.1 kg)  07/20/16 214 lb (97.1 kg)  06/12/16 214 lb (97.1 kg)    Physical Exam  Constitutional: He is oriented to person, place, and time. He appears well-developed. No distress.  NAD  HENT:  Mouth/Throat: Oropharynx is clear and moist.  Eyes: Conjunctivae are normal. Pupils are equal, round, and reactive to light.  Neck: Normal range of motion. No JVD present. No thyromegaly present.  Cardiovascular: Normal rate, regular  rhythm, normal heart sounds and intact distal pulses.  Exam reveals no gallop and no friction rub.   No murmur heard. Pulmonary/Chest: Effort normal and breath sounds normal. No respiratory distress. He has no wheezes. He has no rales. He exhibits no tenderness.  Abdominal: Soft. Bowel sounds are normal. He exhibits no distension and no mass. There is no tenderness. There is no rebound and no guarding.  Musculoskeletal: Normal range of motion. He exhibits tenderness. He exhibits no edema.  Lymphadenopathy:    He has no cervical adenopathy.  Neurological: He is alert and oriented to person, place, and time. He has normal reflexes. No cranial nerve deficit. He exhibits normal muscle tone. He displays a negative Romberg sign. Coordination and gait normal.  Skin: Skin is warm and dry. No rash noted.  Psychiatric: He has a normal mood and affect. His behavior is normal. Judgment and thought content normal.  Obese LS - tender  Lab Results  Component Value Date   WBC 8.5 06/12/2016   HGB 13.5 06/12/2016   HCT 40.3 06/12/2016   PLT 343.0 06/12/2016   GLUCOSE 107 (H) 05/22/2016   CHOL 199 02/09/2016   TRIG 186.0 (H) 02/09/2016   HDL 50.20 02/09/2016   LDLDIRECT 171.0 05/20/2015   LDLCALC 112 (H) 02/09/2016   ALT 16 02/09/2016   AST 17 02/09/2016   NA 140 05/22/2016   K 4.7 05/22/2016   CL 104 05/22/2016   CREATININE 1.13 05/22/2016   BUN 19 05/22/2016   CO2 27 05/22/2016   TSH 0.76 02/09/2016   PSA 0.12 02/09/2016   INR 1.0 06/12/2016   HGBA1C 5.4 05/22/2016    No results found.  Assessment & Plan:   There are no diagnoses linked to this encounter. I am having Mr. Purgason maintain his BIFERA, Multiple Vitamins-Minerals (CENTRUM SILVER PO), ferrous sulfate, FLORA-Q 2, Alum Hydroxide-Mag Trisilicate, psyllium, fluticasone, LYRICA, clotrimazole-betamethasone, triamcinolone cream, promethazine, erythromycin, ondansetron, losartan, pantoprazole, busPIRone, metoCLOPramide, oxyCODONE, and  atorvastatin.  No orders of the defined types were placed in this encounter.    Follow-up: No Follow-up on file.  Walker Kehr, MD

## 2016-08-22 NOTE — Assessment & Plan Note (Signed)
Pantoprazole.

## 2016-08-22 NOTE — Addendum Note (Signed)
Addended by: Elta Guadeloupe on: 08/22/2016 03:24 PM   Modules accepted: Orders

## 2016-08-22 NOTE — Assessment & Plan Note (Signed)
CL was nl

## 2016-08-22 NOTE — Assessment & Plan Note (Signed)
On Losartan 

## 2016-08-22 NOTE — Progress Notes (Signed)
Pre visit review using our clinic review tool, if applicable. No additional management support is needed unless otherwise documented below in the visit note. 

## 2016-08-22 NOTE — Assessment & Plan Note (Signed)
Oxycodone   Potential benefits of a long term opioids use as well as potential risks (i.e. addiction risk, apnea etc) and complications (i.e. Somnolence, constipation and others) were explained to the patient and were aknowledged. 

## 2016-09-13 ENCOUNTER — Other Ambulatory Visit: Payer: Self-pay | Admitting: Internal Medicine

## 2016-11-13 ENCOUNTER — Ambulatory Visit (INDEPENDENT_AMBULATORY_CARE_PROVIDER_SITE_OTHER): Payer: Medicare Other | Admitting: Internal Medicine

## 2016-11-13 ENCOUNTER — Encounter: Payer: Self-pay | Admitting: Internal Medicine

## 2016-11-13 ENCOUNTER — Other Ambulatory Visit (INDEPENDENT_AMBULATORY_CARE_PROVIDER_SITE_OTHER): Payer: Medicare Other

## 2016-11-13 VITALS — BP 162/90 | HR 110 | Wt 217.0 lb

## 2016-11-13 DIAGNOSIS — M5440 Lumbago with sciatica, unspecified side: Secondary | ICD-10-CM | POA: Diagnosis not present

## 2016-11-13 DIAGNOSIS — R739 Hyperglycemia, unspecified: Secondary | ICD-10-CM

## 2016-11-13 DIAGNOSIS — G8929 Other chronic pain: Secondary | ICD-10-CM

## 2016-11-13 DIAGNOSIS — K219 Gastro-esophageal reflux disease without esophagitis: Secondary | ICD-10-CM

## 2016-11-13 DIAGNOSIS — R0609 Other forms of dyspnea: Secondary | ICD-10-CM

## 2016-11-13 DIAGNOSIS — R0600 Dyspnea, unspecified: Secondary | ICD-10-CM

## 2016-11-13 DIAGNOSIS — I10 Essential (primary) hypertension: Secondary | ICD-10-CM | POA: Diagnosis not present

## 2016-11-13 LAB — CBC WITH DIFFERENTIAL/PLATELET
Basophils Absolute: 0 10*3/uL (ref 0.0–0.1)
Basophils Relative: 0.4 % (ref 0.0–3.0)
Eosinophils Absolute: 0.1 10*3/uL (ref 0.0–0.7)
Eosinophils Relative: 1.6 % (ref 0.0–5.0)
HCT: 36 % — ABNORMAL LOW (ref 39.0–52.0)
Hemoglobin: 12.1 g/dL — ABNORMAL LOW (ref 13.0–17.0)
Lymphocytes Relative: 24 % (ref 12.0–46.0)
Lymphs Abs: 1.7 10*3/uL (ref 0.7–4.0)
MCHC: 33.5 g/dL (ref 30.0–36.0)
MCV: 88.4 fl (ref 78.0–100.0)
Monocytes Absolute: 0.7 10*3/uL (ref 0.1–1.0)
Monocytes Relative: 9.9 % (ref 3.0–12.0)
Neutro Abs: 4.6 10*3/uL (ref 1.4–7.7)
Neutrophils Relative %: 64.1 % (ref 43.0–77.0)
Platelets: 404 10*3/uL — ABNORMAL HIGH (ref 150.0–400.0)
RBC: 4.07 Mil/uL — ABNORMAL LOW (ref 4.22–5.81)
RDW: 12.7 % (ref 11.5–15.5)
WBC: 7.1 10*3/uL (ref 4.0–10.5)

## 2016-11-13 LAB — BASIC METABOLIC PANEL
BUN: 24 mg/dL — ABNORMAL HIGH (ref 6–23)
CO2: 28 mEq/L (ref 19–32)
Calcium: 10 mg/dL (ref 8.4–10.5)
Chloride: 103 mEq/L (ref 96–112)
Creatinine, Ser: 1.17 mg/dL (ref 0.40–1.50)
GFR: 65.28 mL/min (ref >60.00)
Glucose, Bld: 111 mg/dL — ABNORMAL HIGH (ref 70–99)
Potassium: 5.1 mEq/L (ref 3.5–5.1)
Sodium: 137 mEq/L (ref 135–145)

## 2016-11-13 LAB — HEMOGLOBIN A1C: Hgb A1c MFr Bld: 5.7 % (ref 4.6–6.5)

## 2016-11-13 MED ORDER — OXYCODONE HCL 15 MG PO TABS: 15.0000 mg | ORAL_TABLET | Freq: Four times a day (QID) | ORAL | 0 refills | Status: DC | PRN

## 2016-11-13 NOTE — Assessment & Plan Note (Signed)
Protonix.  ?

## 2016-11-13 NOTE — Assessment & Plan Note (Signed)
Losartan 

## 2016-11-13 NOTE — Assessment & Plan Note (Signed)
Oxycodone   Potential benefits of a long term opioids use as well as potential risks (i.e. addiction risk, apnea etc) and complications (i.e. Somnolence, constipation and others) were explained to the patient and were aknowledged. 

## 2016-11-13 NOTE — Progress Notes (Signed)
Subjective:  Patient ID: Cody Frazier, male    DOB: 10/28/1946  Age: 71 y.o. MRN: [id number]  CC: Follow-up   HPI Cody Frazier presents for LBP, dyslipidemia, GERD f/u  Outpatient Medications Prior to Visit  Medication Sig Dispense Refill  . Alum Hydroxide-Mag Trisilicate (GAVISCON) A999333 MG CHEW Chew by mouth as needed.      Marland Kitchen atorvastatin (LIPITOR) 20 MG tablet TAKE 1 TABLET BY MOUTH EVERY DAY 90 tablet 3  . busPIRone (BUSPAR) 7.5 MG tablet TAKE 1 - 2 TABLETS DAILY AS DIRECTED FOR ANXIETY. 120 tablet 5  . clotrimazole-betamethasone (LOTRISONE) cream Apply topically 2 (two) times daily. 60 g 1  . erythromycin ophthalmic ointment Place 1 application into both eyes 3 (three) times daily. 3.5 g 0  . ferrous sulfate 325 (65 FE) MG tablet Take 325 mg by mouth 2 (two) times daily.      . fluticasone (FLONASE) 50 MCG/ACT nasal spray 2 sprays by Nasal route daily. 16 g 6  . losartan (COZAAR) 25 MG tablet TAKE 1 TABLET (25 MG TOTAL) BY MOUTH 2 (TWO) TIMES DAILY. 180 tablet 3  . LYRICA 50 MG capsule Take 50 mg by mouth 2 (two) times daily.     . metoCLOPramide (REGLAN) 5 MG tablet TAKE 1 TABLET (5 MG TOTAL) BY MOUTH EVERY 8 (EIGHT) HOURS AS NEEDED FOR NAUSEA. 90 tablet 3  . Multiple Vitamins-Minerals (CENTRUM SILVER PO) Take by mouth daily.      . ondansetron (ZOFRAN) 4 MG tablet TAKE 1 TABLET (4 MG TOTAL) BY MOUTH EVERY 8 (EIGHT) HOURS AS NEEDED FOR NAUSEA OR VOMITING. 20 tablet 0  . oxyCODONE (ROXICODONE) 15 MG immediate release tablet Take 1 tablet (15 mg total) by mouth 4 (four) times daily as needed for pain. For pain - Fill on or after 10/25/16 Please fill every 30 days 120 tablet 0  . pantoprazole (PROTONIX) 40 MG tablet Take 1 tablet (40 mg total) by mouth daily. 90 tablet 3  . Polysacch Fe Cmp-Fe Heme Poly (BIFERA) 28 MG TABS Take by mouth daily.      . Probiotic Product (FLORA-Q 2) CAPS Take by mouth 2 (two) times daily.      . promethazine (PHENERGAN) 25 MG tablet Take 1  tablet (25 mg total) by mouth every 6 (six) hours as needed for nausea or vomiting. 60 tablet 0  . psyllium (METAMUCIL) 58.6 % packet Take 1 packet by mouth 2 (two) times daily.      Marland Kitchen triamcinolone cream (KENALOG) 0.5 % Apply 1 application topically 3 (three) times daily. 90 g 1   No facility-administered medications prior to visit.     ROS Review of Systems  Constitutional: Positive for fatigue. Negative for appetite change and unexpected weight change.  HENT: Negative for congestion, nosebleeds, sneezing, sore throat and trouble swallowing.   Eyes: Negative for itching and visual disturbance.  Respiratory: Positive for shortness of breath. Negative for cough.   Cardiovascular: Negative for chest pain, palpitations and leg swelling.  Gastrointestinal: Negative for abdominal distention, blood in stool, diarrhea and nausea.  Genitourinary: Negative for frequency and hematuria.  Musculoskeletal: Positive for back pain and gait problem. Negative for joint swelling and neck pain.  Skin: Negative for rash.  Neurological: Negative for dizziness, tremors, speech difficulty and weakness.  Psychiatric/Behavioral: Negative for agitation, dysphoric mood and sleep disturbance. The patient is not nervous/anxious.     Objective:  BP (!) 162/90   Pulse (!) 110   Wt 217 lb (  98.4 kg)   SpO2 98%   BMI 31.14 kg/m   BP Readings from Last 3 Encounters:  11/13/16 (!) 162/90  08/22/16 120/80  06/12/16 120/82    Wt Readings from Last 3 Encounters:  11/13/16 217 lb (98.4 kg)  08/22/16 214 lb (97.1 kg)  07/20/16 214 lb (97.1 kg)    Physical Exam  Constitutional: He is oriented to person, place, and time. He appears well-developed. No distress.  NAD  HENT:  Mouth/Throat: Oropharynx is clear and moist.  Eyes: Conjunctivae are normal. Pupils are equal, round, and reactive to light.  Neck: Normal range of motion. No JVD present. No thyromegaly present.  Cardiovascular: Normal rate, regular  rhythm, normal heart sounds and intact distal pulses.  Exam reveals no gallop and no friction rub.   No murmur heard. Pulmonary/Chest: Effort normal and breath sounds normal. No respiratory distress. He has no wheezes. He has no rales. He exhibits no tenderness.  Abdominal: Soft. Bowel sounds are normal. He exhibits no distension and no mass. There is no tenderness. There is no rebound and no guarding.  Musculoskeletal: Normal range of motion. He exhibits tenderness. He exhibits no edema.  Lymphadenopathy:    He has no cervical adenopathy.  Neurological: He is alert and oriented to person, place, and time. He has normal reflexes. No cranial nerve deficit. He exhibits normal muscle tone. He displays a negative Romberg sign. Coordination and gait normal.  Skin: Skin is warm and dry. No rash noted.  Psychiatric: He has a normal mood and affect. His behavior is normal. Judgment and thought content normal.  Obese  Lab Results  Component Value Date   WBC 8.5 06/12/2016   HGB 13.5 06/12/2016   HCT 40.3 06/12/2016   PLT 343.0 06/12/2016   GLUCOSE 107 (H) 05/22/2016   CHOL 199 02/09/2016   TRIG 186.0 (H) 02/09/2016   HDL 50.20 02/09/2016   LDLDIRECT 171.0 05/20/2015   LDLCALC 112 (H) 02/09/2016   ALT 16 02/09/2016   AST 17 02/09/2016   NA 140 05/22/2016   K 4.7 05/22/2016   CL 104 05/22/2016   CREATININE 1.13 05/22/2016   BUN 19 05/22/2016   CO2 27 05/22/2016   TSH 0.76 02/09/2016   PSA 0.12 02/09/2016   INR 1.0 06/12/2016   HGBA1C 5.4 05/22/2016    No results found.  Assessment & Plan:   There are no diagnoses linked to this encounter. I am having Mr. Catrett maintain his BIFERA, Multiple Vitamins-Minerals (CENTRUM SILVER PO), ferrous sulfate, FLORA-Q 2, Alum Hydroxide-Mag Trisilicate, psyllium, fluticasone, LYRICA, triamcinolone cream, promethazine, erythromycin, ondansetron, losartan, pantoprazole, busPIRone, atorvastatin, clotrimazole-betamethasone, oxyCODONE, and  metoCLOPramide.  No orders of the defined types were placed in this encounter.    Follow-up: No Follow-up on file.  Walker Kehr, MD

## 2016-11-13 NOTE — Assessment & Plan Note (Signed)
-  deconditioning Stress test was ok Start exercising

## 2016-11-22 ENCOUNTER — Other Ambulatory Visit: Payer: Self-pay | Admitting: *Deleted

## 2016-11-22 MED ORDER — TRIAMCINOLONE ACETONIDE 0.5 % EX CREA: 1.0000 "application " | TOPICAL_CREAM | Freq: Three times a day (TID) | CUTANEOUS | 1 refills | Status: DC

## 2016-11-22 NOTE — Telephone Encounter (Signed)
Rec'd call from [place] stating she had sent request to have pt Triamcinolone cream refill, but have not received back. Inform kelly will send electronically as we speak...Johny Chess

## 2016-12-18 DIAGNOSIS — H40003 Preglaucoma, unspecified, bilateral: Secondary | ICD-10-CM | POA: Diagnosis not present

## 2016-12-24 ENCOUNTER — Other Ambulatory Visit: Payer: Self-pay | Admitting: *Deleted

## 2016-12-24 MED ORDER — METOCLOPRAMIDE HCL 5 MG PO TABS: ORAL_TABLET | ORAL | 0 refills | Status: DC

## 2017-01-29 DIAGNOSIS — H25813 Combined forms of age-related cataract, bilateral: Secondary | ICD-10-CM | POA: Diagnosis not present

## 2017-01-29 DIAGNOSIS — H40002 Preglaucoma, unspecified, left eye: Secondary | ICD-10-CM | POA: Diagnosis not present

## 2017-01-29 DIAGNOSIS — H401212 Low-tension glaucoma, right eye, moderate stage: Secondary | ICD-10-CM | POA: Diagnosis not present

## 2017-02-06 ENCOUNTER — Other Ambulatory Visit (INDEPENDENT_AMBULATORY_CARE_PROVIDER_SITE_OTHER): Payer: Medicare Other

## 2017-02-06 ENCOUNTER — Ambulatory Visit (INDEPENDENT_AMBULATORY_CARE_PROVIDER_SITE_OTHER): Payer: Medicare Other | Admitting: Internal Medicine

## 2017-02-06 ENCOUNTER — Other Ambulatory Visit: Payer: Self-pay | Admitting: Internal Medicine

## 2017-02-06 ENCOUNTER — Encounter: Payer: Self-pay | Admitting: Internal Medicine

## 2017-02-06 DIAGNOSIS — B079 Viral wart, unspecified: Secondary | ICD-10-CM

## 2017-02-06 DIAGNOSIS — R0683 Snoring: Secondary | ICD-10-CM

## 2017-02-06 DIAGNOSIS — R0609 Other forms of dyspnea: Secondary | ICD-10-CM | POA: Diagnosis not present

## 2017-02-06 DIAGNOSIS — G8929 Other chronic pain: Secondary | ICD-10-CM | POA: Diagnosis not present

## 2017-02-06 DIAGNOSIS — R0600 Dyspnea, unspecified: Secondary | ICD-10-CM

## 2017-02-06 DIAGNOSIS — I10 Essential (primary) hypertension: Secondary | ICD-10-CM | POA: Diagnosis not present

## 2017-02-06 DIAGNOSIS — D500 Iron deficiency anemia secondary to blood loss (chronic): Secondary | ICD-10-CM

## 2017-02-06 DIAGNOSIS — M5440 Lumbago with sciatica, unspecified side: Secondary | ICD-10-CM | POA: Diagnosis not present

## 2017-02-06 LAB — CBC WITH DIFFERENTIAL/PLATELET
Basophils Absolute: 0 10*3/uL (ref 0.0–0.1)
Basophils Relative: 0.8 % (ref 0.0–3.0)
Eosinophils Absolute: 0.2 10*3/uL (ref 0.0–0.7)
Eosinophils Relative: 4.1 % (ref 0.0–5.0)
HCT: 32.9 % — ABNORMAL LOW (ref 39.0–52.0)
Hemoglobin: 10.6 g/dL — ABNORMAL LOW (ref 13.0–17.0)
Lymphocytes Relative: 30.5 % (ref 12.0–46.0)
Lymphs Abs: 1.9 10*3/uL (ref 0.7–4.0)
MCHC: 32.3 g/dL (ref 30.0–36.0)
MCV: 83.7 fl (ref 78.0–100.0)
Monocytes Absolute: 0.7 10*3/uL (ref 0.1–1.0)
Monocytes Relative: 12.2 % — ABNORMAL HIGH (ref 3.0–12.0)
Neutro Abs: 3.2 10*3/uL (ref 1.4–7.7)
Neutrophils Relative %: 52.4 % (ref 43.0–77.0)
Platelets: 384 10*3/uL (ref 150.0–400.0)
RBC: 3.93 Mil/uL — ABNORMAL LOW (ref 4.22–5.81)
RDW: 14.5 % (ref 11.5–15.5)
WBC: 6.1 10*3/uL (ref 4.0–10.5)

## 2017-02-06 MED ORDER — OXYCODONE HCL 15 MG PO TABS: 15.0000 mg | ORAL_TABLET | Freq: Four times a day (QID) | ORAL | 0 refills | Status: DC | PRN

## 2017-02-06 MED ORDER — VALSARTAN 320 MG PO TABS: 320.0000 mg | ORAL_TABLET | Freq: Every day | ORAL | 3 refills | Status: DC

## 2017-02-06 MED ORDER — ERYTHROMYCIN 5 MG/GM OP OINT: 1.0000 "application " | TOPICAL_OINTMENT | Freq: Three times a day (TID) | OPHTHALMIC | 0 refills | Status: DC

## 2017-02-06 NOTE — Assessment & Plan Note (Signed)
See procedure 

## 2017-02-06 NOTE — Assessment & Plan Note (Signed)
Oxy   Potential benefits of a long term opioids use as well as potential risks (i.e. addiction risk, apnea etc) and complications (i.e. Somnolence, constipation and others) were explained to the patient and were aknowledged.  

## 2017-02-06 NOTE — Progress Notes (Signed)
Subjective:  Patient ID: Cody Frazier, male    DOB: July 11, 1946  Age: 71 y.o. MRN: [id number]  CC: Hypertension; Gastroesophageal Reflux; Hypogonadism; Hyperglycemia; and Eye Problem (Right eye, Skin tag and glycoma )   HPI TYRICE HEWITT presents for LBP, GERD, dyslipidemia f/u C/o snorring  Outpatient Medications Prior to Visit  Medication Sig Dispense Refill  . Alum Hydroxide-Mag Trisilicate (GAVISCON) 67-12.4 MG CHEW Chew by mouth as needed.      Marland Kitchen atorvastatin (LIPITOR) 20 MG tablet TAKE 1 TABLET BY MOUTH EVERY DAY 90 tablet 3  . busPIRone (BUSPAR) 7.5 MG tablet TAKE 1 - 2 TABLETS DAILY AS DIRECTED FOR ANXIETY. 120 tablet 5  . clotrimazole-betamethasone (LOTRISONE) cream Apply topically 2 (two) times daily. 60 g 1  . erythromycin ophthalmic ointment Place 1 application into both eyes 3 (three) times daily. 3.5 g 0  . ferrous sulfate 325 (65 FE) MG tablet Take 325 mg by mouth 2 (two) times daily.      . fluticasone (FLONASE) 50 MCG/ACT nasal spray 2 sprays by Nasal route daily. 16 g 6  . losartan (COZAAR) 25 MG tablet TAKE 1 TABLET (25 MG TOTAL) BY MOUTH 2 (TWO) TIMES DAILY. 180 tablet 3  . LYRICA 50 MG capsule Take 50 mg by mouth 2 (two) times daily.     . metoCLOPramide (REGLAN) 5 MG tablet TAKE 1 TABLET (5 MG TOTAL) BY MOUTH EVERY 8 (EIGHT) HOURS AS NEEDED FOR NAUSEA. 90 tablet 0  . Multiple Vitamins-Minerals (CENTRUM SILVER PO) Take by mouth daily.      . ondansetron (ZOFRAN) 4 MG tablet TAKE 1 TABLET (4 MG TOTAL) BY MOUTH EVERY 8 (EIGHT) HOURS AS NEEDED FOR NAUSEA OR VOMITING. 20 tablet 0  . oxyCODONE (ROXICODONE) 15 MG immediate release tablet Take 1 tablet (15 mg total) by mouth 4 (four) times daily as needed for pain. For pain - Fill on or after 01/23/17 Please fill every 30 days 120 tablet 0  . pantoprazole (PROTONIX) 40 MG tablet Take 1 tablet (40 mg total) by mouth daily. 90 tablet 3  . Polysacch Fe Cmp-Fe Heme Poly (BIFERA) 28 MG TABS Take by mouth daily.      .  Probiotic Product (FLORA-Q 2) CAPS Take by mouth 2 (two) times daily.      . promethazine (PHENERGAN) 25 MG tablet Take 1 tablet (25 mg total) by mouth every 6 (six) hours as needed for nausea or vomiting. 60 tablet 0  . psyllium (METAMUCIL) 58.6 % packet Take 1 packet by mouth 2 (two) times daily.      Marland Kitchen triamcinolone cream (KENALOG) 0.5 % Apply 1 application topically 3 (three) times daily. 90 g 1   No facility-administered medications prior to visit.     ROS Review of Systems  Constitutional: Positive for unexpected weight change. Negative for appetite change and fatigue.  HENT: Negative for congestion, nosebleeds, sneezing, sore throat and trouble swallowing.   Eyes: Negative for itching and visual disturbance.  Respiratory: Negative for cough.   Cardiovascular: Negative for chest pain, palpitations and leg swelling.  Gastrointestinal: Negative for abdominal distention, blood in stool, diarrhea and nausea.  Genitourinary: Negative for frequency and hematuria.  Musculoskeletal: Positive for back pain and gait problem. Negative for joint swelling and neck pain.  Skin: Negative for rash.  Neurological: Negative for dizziness, tremors, speech difficulty and weakness.  Psychiatric/Behavioral: Negative for agitation, dysphoric mood, sleep disturbance and suicidal ideas. The patient is not nervous/anxious.     Objective:  BP (!) 156/98   Pulse 97   Temp 98.7 F (37.1 C) (Oral)   Resp 16   Ht 5\' 5"  (1.651 m) Comment: measured without shoes  Wt 219 lb (99.3 kg)   SpO2 97%   BMI 36.44 kg/m   BP Readings from Last 3 Encounters:  02/06/17 (!) 156/98  11/13/16 (!) 162/90  08/22/16 120/80    Wt Readings from Last 3 Encounters:  02/06/17 219 lb (99.3 kg)  11/13/16 217 lb (98.4 kg)  08/22/16 214 lb (97.1 kg)    Physical Exam  Constitutional: He is oriented to person, place, and time. He appears well-developed. No distress.  NAD  HENT:  Mouth/Throat: Oropharynx is clear and  moist.  Eyes: Conjunctivae are normal. Pupils are equal, round, and reactive to light.  Neck: Normal range of motion. No JVD present. No thyromegaly present.  Cardiovascular: Normal rate, regular rhythm, normal heart sounds and intact distal pulses.  Exam reveals no gallop and no friction rub.   No murmur heard. Pulmonary/Chest: Effort normal and breath sounds normal. No respiratory distress. He has no wheezes. He has no rales. He exhibits no tenderness.  Abdominal: Soft. Bowel sounds are normal. He exhibits no distension and no mass. There is no tenderness. There is no rebound and no guarding.  Musculoskeletal: Normal range of motion. He exhibits tenderness. He exhibits no edema.  Lymphadenopathy:    He has no cervical adenopathy.  Neurological: He is alert and oriented to person, place, and time. He has normal reflexes. No cranial nerve deficit. He exhibits normal muscle tone. He displays a negative Romberg sign. Coordination and gait normal.  Skin: Skin is warm and dry. No rash noted.  Psychiatric: He has a normal mood and affect. His behavior is normal. Judgment and thought content normal.   Wart on R lower eyelid    Procedure Note :     Procedure : Cryosurgery   Indication:  Wart(s)  R lower eyelid   Risks including unsuccessful procedure , bleeding, infection, bruising, scar, a need for a repeat  procedure and others were explained to the patient in detail as well as the benefits. Informed consent was obtained verbally.   1  lesion(s)  On R lower eyelid  was/were treated with liquid nitrogen on a Q-tip in a usual fasion . Then, the lesion was cut off   Tolerated well. Complications none.     Lab Results  Component Value Date   WBC 7.1 11/13/2016   HGB 12.1 (L) 11/13/2016   HCT 36.0 (L) 11/13/2016   PLT 404.0 (H) 11/13/2016   GLUCOSE 111 (H) 11/13/2016   CHOL 199 02/09/2016   TRIG 186.0 (H) 02/09/2016   HDL 50.20 02/09/2016   LDLDIRECT 171.0 05/20/2015   LDLCALC 112 (H)  02/09/2016   ALT 16 02/09/2016   AST 17 02/09/2016   NA 137 11/13/2016   K 5.1 11/13/2016   CL 103 11/13/2016   CREATININE 1.17 11/13/2016   BUN 24 (H) 11/13/2016   CO2 28 11/13/2016   TSH 0.76 02/09/2016   PSA 0.12 02/09/2016   INR 1.0 06/12/2016   HGBA1C 5.7 11/13/2016    No results found.  Assessment & Plan:   There are no diagnoses linked to this encounter. I am having Mr. Runk maintain his BIFERA, Multiple Vitamins-Minerals (CENTRUM SILVER PO), ferrous sulfate, FLORA-Q 2, Alum Hydroxide-Mag Trisilicate, psyllium, fluticasone, LYRICA, promethazine, erythromycin, ondansetron, losartan, pantoprazole, busPIRone, atorvastatin, clotrimazole-betamethasone, oxyCODONE, triamcinolone cream, and metoCLOPramide.  No orders of the defined  types were placed in this encounter.    Follow-up: No Follow-up on file.  Walker Kehr, MD

## 2017-02-06 NOTE — Assessment & Plan Note (Signed)
Worse D/c losartan Start Diovan instead

## 2017-02-06 NOTE — Progress Notes (Signed)
Pre-visit discussion using our clinic review tool. No additional management support is needed unless otherwise documented below in the visit note.  

## 2017-02-06 NOTE — Assessment & Plan Note (Signed)
Pulm ref - r/o OSA

## 2017-02-06 NOTE — Assessment & Plan Note (Signed)
Re-conditioning discussed

## 2017-02-11 ENCOUNTER — Other Ambulatory Visit: Payer: Self-pay | Admitting: Internal Medicine

## 2017-02-21 NOTE — Progress Notes (Signed)
Pre visit review using our clinic review tool, if applicable. No additional management support is needed unless otherwise documented below in the visit note. 

## 2017-02-21 NOTE — Progress Notes (Addendum)
Subjective:   Cody Frazier is a 71 y.o. male who presents for Medicare Annual/Subsequent preventive examination.  Review of Systems:  No ROS.  Medicare Wellness Visit.  Cardiac Risk Factors include: dyslipidemia;advanced age (>23men, >59 women);hypertension;male gender Sleep patterns: no sleep issues, feels rested on waking, gets up 2-3 times nightly to void and sleeps 9 hours nightly.   Home Safety/Smoke Alarms:  Feels safe in home. Smoke alarms in place.   Living environment; residence and Firearm Safety: 2-story house, no firearms.  lives with wife Seat Belt Safety/Bike Helmet: Wears seat belt.   Counseling:   Eye Exam- appointment yearly Dental- appointment every 6 months  Male:   CCS- Last 04/02/12, precancerous polyps, recall 3 years, has an upcoming appointment with Dr. Fuller Plan    PSA-  Lab Results  Component Value Date   PSA 0.12 02/09/2016   PSA 0.13 02/24/2015   PSA 0.16 08/19/2014      Objective:    Vitals: BP 138/90   Pulse (!) 104   Resp 20   Ht 5\' 5"  (1.651 m)   Wt 215 lb (97.5 kg)   SpO2 99%   BMI 35.78 kg/m   Body mass index is 35.78 kg/m.  Tobacco History  Smoking Status  . Former Smoker  Smokeless Tobacco  . Never Used    Comment: as a teenager     Counseling given: Not Answered   Past Medical History:  Diagnosis Date  . Allergic rhinitis   . Anemia, iron deficiency   . Atrial arrhythmia   . Atrial fib/flutter, transient    following prior surgeries x 2  . Barrett's esophagus without dysplasia   . BPH (benign prostatic hyperplasia)   . Chronic LBP   . DDD (degenerative disc disease)   . Diverticulosis of colon   . Duodenal stricture   . Gastric outlet obstruction   . GERD (gastroesophageal reflux disease)   . Hemorrhoids   . Hyperlipidemia   . Hyperplastic colonic polyp 01/2000  . Hypertension   . Nephrolithiasis    hx of B  . Peptic ulcer disease with hemorrhage 08/2008   and [facility]. Pylori Ab negative  . Renal cyst   .  Tubular adenoma of colon 03/2012   Past Surgical History:  Procedure Laterality Date  . APPENDECTOMY    . BACK SURGERY  02/2003   L5  . BILROTH I PROCEDURE  2011   Dr Johney Maine  . HIATAL HERNIA REPAIR    . LUMBAR FUSION  2009   Family History  Problem Relation Age of Onset  . Cancer Mother     colon  . Colon polyps Mother   . Colon cancer Mother 106  . Cancer Father     colon and prostate cancer  . Colon polyps Father   . Colon cancer Father 10  . Cancer Paternal Uncle     colon  . Colon cancer Paternal Uncle 84  . Colon cancer Paternal Uncle 43  . Hypertension Other   . Cancer Cousin     colon  . Stomach cancer Neg Hx    History  Sexual Activity  . Sexual activity: Yes    Outpatient Encounter Prescriptions as of 02/22/2017  Medication Sig  . Alum Hydroxide-Mag Trisilicate (GAVISCON) 96-29.5 MG CHEW Chew by mouth as needed.    Marland Kitchen atorvastatin (LIPITOR) 20 MG tablet TAKE 1 TABLET BY MOUTH EVERY DAY  . busPIRone (BUSPAR) 7.5 MG tablet TAKE 1 - 2 TABLETS DAILY AS DIRECTED  FOR ANXIETY.  . clotrimazole-betamethasone (LOTRISONE) cream Apply topically 2 (two) times daily.  . ferrous sulfate 325 (65 FE) MG tablet Take 325 mg by mouth 2 (two) times daily.    . fluticasone (FLONASE) 50 MCG/ACT nasal spray Place 2 sprays into both nostrils daily.  . metoCLOPramide (REGLAN) 5 MG tablet TAKE 1 TABLET (5 MG TOTAL) BY MOUTH EVERY 8 (EIGHT) HOURS AS NEEDED FOR NAUSEA.  . Multiple Vitamins-Minerals (CENTRUM SILVER PO) Take by mouth daily.    . ondansetron (ZOFRAN) 4 MG tablet TAKE 1 TABLET (4 MG TOTAL) BY MOUTH EVERY 8 (EIGHT) HOURS AS NEEDED FOR NAUSEA OR VOMITING.  Marland Kitchen oxyCODONE (ROXICODONE) 15 MG immediate release tablet Take 1 tablet (15 mg total) by mouth 4 (four) times daily as needed for pain. For pain - Fill on or after 04/25/17 Please fill every 30 days  . pantoprazole (PROTONIX) 40 MG tablet TAKE 1 TABLET (40 MG TOTAL) BY MOUTH DAILY.  Marland Kitchen psyllium (METAMUCIL) 58.6 % packet Take 1  packet by mouth 2 (two) times daily.    Marland Kitchen triamcinolone cream (KENALOG) 0.5 % Apply 1 application topically 3 (three) times daily.  . valsartan (DIOVAN) 320 MG tablet Take 1 tablet (320 mg total) by mouth daily.  . [DISCONTINUED] fluticasone (FLONASE) 50 MCG/ACT nasal spray 2 sprays by Nasal route daily.  . [DISCONTINUED] erythromycin ophthalmic ointment Place 1 application into the right eye 3 (three) times daily. (Patient not taking: Reported on 02/22/2017)  . [DISCONTINUED] losartan (COZAAR) 25 MG tablet TAKE 1 TABLET (25 MG TOTAL) BY MOUTH 2 (TWO) TIMES DAILY. (Patient not taking: Reported on 02/22/2017)  . [DISCONTINUED] LYRICA 50 MG capsule Take 50 mg by mouth 2 (two) times daily.   . [DISCONTINUED] Polysacch Fe Cmp-Fe Heme Poly (BIFERA) 28 MG TABS Take by mouth daily.    . [DISCONTINUED] Probiotic Product (FLORA-Q 2) CAPS Take by mouth 2 (two) times daily.    . [DISCONTINUED] promethazine (PHENERGAN) 25 MG tablet Take 1 tablet (25 mg total) by mouth every 6 (six) hours as needed for nausea or vomiting. (Patient not taking: Reported on 02/22/2017)   No facility-administered encounter medications on file as of 02/22/2017.     Activities of Daily Living In your present state of health, do you have any difficulty performing the following activities: 02/22/2017  Hearing? N  Vision? N  Difficulty concentrating or making decisions? N  Walking or climbing stairs? N  Dressing or bathing? N  Doing errands, shopping? N  Preparing Food and eating ? N  Using the Toilet? N  In the past six months, have you accidently leaked urine? N  Do you have problems with loss of bowel control? N  Managing your Medications? N  Managing your Finances? N  Housekeeping or managing your Housekeeping? N  Some recent data might be hidden    Patient Care Team: Cassandria Anger, MD as PCP - General Leeroy Cha, MD (Neurosurgery) Michael Boston, MD (General Surgery) Ladene Artist, MD  (Gastroenterology) Franchot Gallo, MD as Attending Physician (Urology)   Assessment:    Physical assessment deferred to PCP.  Exercise Activities and Dietary recommendations Current Exercise Habits: Home exercise routine, Type of exercise: treadmill, Time (Minutes): 20, Frequency (Times/Week): 6, Weekly Exercise (Minutes/Week): 120, Intensity: Mild Diet (meal preparation, eat out, water intake, caffeinated beverages, dairy products, fruits and vegetables): in general, a "healthy" diet  , well balanced, low fat/ cholesterol, low salt , reports drinking 2-3 glasses of sweet tea daily, 2 glasses of water  Encouraged patient to increase water intake, provided patient with nutritional strategies for weight loss education and handout.   Goals    . Want to lose 10  pounds          Continue to walk on treadmill daily and monitor my diet closely to be as healthy as possible      Fall Risk Fall Risk  02/22/2017 02/21/2016 05/26/2015 02/22/2015  Falls in the past year? No No No No   Depression Screen PHQ 2/9 Scores 02/22/2017 02/21/2016 02/22/2015  PHQ - 2 Score 1 1 0  PHQ- 9 Score 1 - -    Cognitive Function MMSE - Mini Mental State Exam 02/22/2017  Orientation to time 5  Orientation to Place 5  Registration 3  Attention/ Calculation 3  Recall 3  Language- name 2 objects 2  Language- repeat 1  Language- follow 3 step command 3  Language- read & follow direction 1  Write a sentence 1  Copy design 1  Total score 28        Immunization History  Administered Date(s) Administered  . DT 02/09/2011  . Influenza Split 08/13/2011, 08/19/2012  . Influenza Whole 09/26/2009, 08/01/2010  . Influenza, High Dose Seasonal PF 08/25/2013, 10/04/2015, 08/22/2016  . Influenza,inj,Quad PF,36+ Mos 08/25/2014  . Pneumococcal Conjugate-13 11/22/2014  . Pneumococcal Polysaccharide-23 11/19/2011  . Td 05/26/2015  . Zoster 02/17/2008   Screening Tests Health Maintenance  Topic Date Due  .  Hepatitis C Screening  07/22/1946  . COLONOSCOPY  04/03/2015  . INFLUENZA VACCINE  06/12/2017  . TETANUS/TDAP  05/25/2025  . PNA vac Low Risk Adult  Completed      Plan:     Continue to eat heart healthy diet (full of fruits, vegetables, whole grains, lean protein, water--limit salt, fat, and sugar intake) and increase physical activity as tolerated.  Continue doing brain stimulating activities (puzzles, reading, adult coloring books, staying active) to keep memory sharp.   During the course of the visit the patient was educated and counseled about the following appropriate screening and preventive services:   Vaccines to include Pneumoccal, Influenza, Hepatitis B, Td, Zostavax, HCV  Cardiovascular Disease  Colorectal cancer screening  Diabetes screening  Prostate Cancer Screening  Glaucoma screening  Nutrition counseling    Patient Instructions (the written plan) was given to the patient.    Michiel Cowboy, RN  02/22/2017  Medical screening examination/treatment/procedure(s) were performed by non-physician practitioner and as supervising physician I was immediately available for consultation/collaboration. I agree with above. Walker Kehr, MD

## 2017-02-22 ENCOUNTER — Other Ambulatory Visit: Payer: Self-pay | Admitting: *Deleted

## 2017-02-22 ENCOUNTER — Ambulatory Visit (INDEPENDENT_AMBULATORY_CARE_PROVIDER_SITE_OTHER): Payer: Medicare Other | Admitting: *Deleted

## 2017-02-22 VITALS — BP 138/90 | HR 104 | Resp 20 | Ht 65.0 in | Wt 215.0 lb

## 2017-02-22 DIAGNOSIS — Z0000 Encounter for general adult medical examination without abnormal findings: Secondary | ICD-10-CM

## 2017-02-22 MED ORDER — FLUTICASONE PROPIONATE 50 MCG/ACT NA SUSP
2.0000 | Freq: Every day | NASAL | 6 refills | Status: DC
Start: 2017-02-22 — End: 2017-10-08

## 2017-02-22 NOTE — Telephone Encounter (Signed)
During AWV patient requested refill for flonase. Nurse received a verbal order from PCP and the prescription was sent to electronically to patient's pharmacy.

## 2017-02-22 NOTE — Patient Instructions (Signed)
Continue to eat heart healthy diet (full of fruits, vegetables, whole grains, lean protein, water--limit salt, fat, and sugar intake) and increase physical activity as tolerated.  Continue doing brain stimulating activities (puzzles, reading, adult coloring books, staying active) to keep memory sharp.   [employer]ARP website for memory games   Cody Frazier , Thank you for taking time to come for your Medicare Wellness Visit. I appreciate your ongoing commitment to your health goals. Please review the following plan we discussed and let me know if I can assist you in the future.   These are the goals we discussed: Goals    . Want to lose 10  pounds          Continue to walk on treadmill daily and monitor my diet closely to be as healthy as possible       This is a list of the screening recommended for you and due dates:  Health Maintenance  Topic Date Due  .  Hepatitis C: One time screening is recommended by Center for Disease Control  (CDC) for  adults born from [birth year] through 1966.   Dec 06, 1945  . Colon Cancer Screening  04/03/2015  . Flu Shot  06/12/2017  . Tetanus Vaccine  05/25/2025  . Pneumonia vaccines  Completed

## 2017-03-07 ENCOUNTER — Encounter: Payer: Self-pay | Admitting: Internal Medicine

## 2017-03-11 ENCOUNTER — Other Ambulatory Visit (INDEPENDENT_AMBULATORY_CARE_PROVIDER_SITE_OTHER): Payer: Medicare Other

## 2017-03-11 ENCOUNTER — Encounter: Payer: Self-pay | Admitting: Gastroenterology

## 2017-03-11 ENCOUNTER — Encounter (INDEPENDENT_AMBULATORY_CARE_PROVIDER_SITE_OTHER): Payer: Self-pay

## 2017-03-11 ENCOUNTER — Ambulatory Visit (INDEPENDENT_AMBULATORY_CARE_PROVIDER_SITE_OTHER): Payer: Medicare Other | Admitting: Gastroenterology

## 2017-03-11 VITALS — BP 128/80 | HR 84 | Ht 66.0 in | Wt 217.5 lb

## 2017-03-11 DIAGNOSIS — K2270 Barrett's esophagus without dysplasia: Secondary | ICD-10-CM | POA: Diagnosis not present

## 2017-03-11 DIAGNOSIS — Z800 Family history of malignant neoplasm of digestive organs: Secondary | ICD-10-CM

## 2017-03-11 DIAGNOSIS — D509 Iron deficiency anemia, unspecified: Secondary | ICD-10-CM | POA: Diagnosis not present

## 2017-03-11 DIAGNOSIS — H40002 Preglaucoma, unspecified, left eye: Secondary | ICD-10-CM | POA: Diagnosis not present

## 2017-03-11 DIAGNOSIS — H401212 Low-tension glaucoma, right eye, moderate stage: Secondary | ICD-10-CM | POA: Diagnosis not present

## 2017-03-11 LAB — CBC WITH DIFFERENTIAL/PLATELET
Basophils Absolute: 0.1 10*3/uL (ref 0.0–0.1)
Basophils Relative: 0.7 % (ref 0.0–3.0)
Eosinophils Absolute: 0.2 10*3/uL (ref 0.0–0.7)
Eosinophils Relative: 2.6 % (ref 0.0–5.0)
HCT: 35.9 % — ABNORMAL LOW (ref 39.0–52.0)
Hemoglobin: 11.6 g/dL — ABNORMAL LOW (ref 13.0–17.0)
Lymphocytes Relative: 21.4 % (ref 12.0–46.0)
Lymphs Abs: 1.7 10*3/uL (ref 0.7–4.0)
MCHC: 32.2 g/dL (ref 30.0–36.0)
MCV: 83.3 fl (ref 78.0–100.0)
Monocytes Absolute: 0.9 10*3/uL (ref 0.1–1.0)
Monocytes Relative: 11.2 % (ref 3.0–12.0)
Neutro Abs: 5.2 10*3/uL (ref 1.4–7.7)
Neutrophils Relative %: 64.1 % (ref 43.0–77.0)
Platelets: 381 10*3/uL (ref 150.0–400.0)
RBC: 4.31 Mil/uL (ref 4.22–5.81)
RDW: 15 % (ref 11.5–15.5)
WBC: 8.1 10*3/uL (ref 4.0–10.5)

## 2017-03-11 LAB — FERRITIN: Ferritin: 6.9 ng/mL — ABNORMAL LOW (ref 22.0–322.0)

## 2017-03-11 LAB — IGA: IgA: 213 mg/dL (ref 68–378)

## 2017-03-11 LAB — IBC PANEL
Iron: 54 ug/dL (ref 42–165)
Saturation Ratios: 11.1 % — ABNORMAL LOW (ref 20.0–50.0)
Transferrin: 349 mg/dL (ref 212.0–360.0)

## 2017-03-11 MED ORDER — NA SULFATE-K SULFATE-MG SULF 17.5-3.13-1.6 GM/177ML PO SOLN: 1.0000 | Freq: Once | ORAL | 0 refills | Status: AC

## 2017-03-11 NOTE — Patient Instructions (Signed)
Your physician has requested that you go to the basement for lab work before leaving today.  Continue your iron twice daily.  Follow the instructions on the Hemoccult cards and mail them back to [place] when you are finished or you may take them directly to the lab in the basement of the [place] building. We will call you with the results.   You have been scheduled for an endoscopy and colonoscopy. Please follow the written instructions given to you at your visit today. Please pick up your prep supplies at the pharmacy within the next 1-3 days. If you use inhalers (even only as needed), please bring them with you on the day of your procedure. Your physician has requested that you go to www.startemmi.com and enter the access code given to you at your visit today. This web site gives a general overview about your procedure. However, you should still follow specific instructions given to you by our office regarding your preparation for the procedure.  Normal BMI (Body Mass Index- based on height and weight) is between 23 and 30. Your BMI today is Body mass index is 35.11 kg/m. Marland Kitchen Please consider follow up  regarding your BMI with your Primary Care Provider.  Thank you for choosing me and [facility].  Pricilla Riffle. Dagoberto Ligas., MD., Marval Regal

## 2017-03-11 NOTE — Progress Notes (Signed)
History of Present Illness: This is a 71 year old male referred by Plotnikov, Evie Lacks, MD for the evaluation of iron deficiency anemia. Recent Hb = 10.6. He has no gastrointestinal complaints and denies any blood per rectum. He has no reflux symptoms. He did not return for recommended Barrett's surveillance in 2016. He was treated for iron deficiency anemia last year and it has reccurred. Denies weight loss, abdominal pain, constipation, diarrhea, change in stool caliber, melena, hematochezia, nausea, vomiting, dysphagia, reflux symptoms, chest pain.   Allergies  Allergen Reactions  . Acetaminophen     REACTION: ?? ulcers  . Codeine Phosphate Swelling   Outpatient Medications Prior to Visit  Medication Sig Dispense Refill  . Alum Hydroxide-Mag Trisilicate (GAVISCON) 93-71.6 MG CHEW Chew by mouth as needed.      Marland Kitchen atorvastatin (LIPITOR) 20 MG tablet TAKE 1 TABLET BY MOUTH EVERY DAY 90 tablet 3  . busPIRone (BUSPAR) 7.5 MG tablet TAKE 1 - 2 TABLETS DAILY AS DIRECTED FOR ANXIETY. 120 tablet 5  . clotrimazole-betamethasone (LOTRISONE) cream Apply topically 2 (two) times daily. 60 g 1  . ferrous sulfate 325 (65 FE) MG tablet Take 325 mg by mouth 2 (two) times daily.      . fluticasone (FLONASE) 50 MCG/ACT nasal spray Place 2 sprays into both nostrils daily. 16 g 6  . metoCLOPramide (REGLAN) 5 MG tablet TAKE 1 TABLET (5 MG TOTAL) BY MOUTH EVERY 8 (EIGHT) HOURS AS NEEDED FOR NAUSEA. 90 tablet 0  . Multiple Vitamins-Minerals (CENTRUM SILVER PO) Take by mouth daily.      . ondansetron (ZOFRAN) 4 MG tablet TAKE 1 TABLET (4 MG TOTAL) BY MOUTH EVERY 8 (EIGHT) HOURS AS NEEDED FOR NAUSEA OR VOMITING. 20 tablet 0  . oxyCODONE (ROXICODONE) 15 MG immediate release tablet Take 1 tablet (15 mg total) by mouth 4 (four) times daily as needed for pain. For pain - Fill on or after 04/25/17 Please fill every 30 days 120 tablet 0  . pantoprazole (PROTONIX) 40 MG tablet TAKE 1 TABLET (40 MG TOTAL) BY MOUTH DAILY.  90 tablet 3  . psyllium (METAMUCIL) 58.6 % packet Take 1 packet by mouth 2 (two) times daily.      Marland Kitchen triamcinolone cream (KENALOG) 0.5 % Apply 1 application topically 3 (three) times daily. 90 g 1  . valsartan (DIOVAN) 320 MG tablet Take 1 tablet (320 mg total) by mouth daily. 90 tablet 3   No facility-administered medications prior to visit.    Past Medical History:  Diagnosis Date  . Allergic rhinitis   . Anemia, iron deficiency   . Atrial arrhythmia   . Atrial fib/flutter, transient    following prior surgeries x 2  . Barrett's esophagus without dysplasia   . BPH (benign prostatic hyperplasia)   . Chronic LBP   . DDD (degenerative disc disease)   . Diverticulosis of colon   . Duodenal stricture   . Gastric outlet obstruction   . GERD (gastroesophageal reflux disease)   . Hemorrhoids   . Hyperlipidemia   . Hyperplastic colonic polyp 01/2000  . Hypertension   . Nephrolithiasis    hx of B  . Peptic ulcer disease with hemorrhage 08/2008   and [facility]. Pylori Ab negative  . Renal cyst   . Tubular adenoma of colon 03/2012   Past Surgical History:  Procedure Laterality Date  . APPENDECTOMY    . BACK SURGERY  02/2003   L5  . [place] I PROCEDURE  2011  Dr Johney Maine  . HIATAL HERNIA REPAIR    . LUMBAR FUSION  2009   Social History   Social History  . Marital status: Married    Spouse name: Jaymarion Trombly  . Number of children: N/A  . Years of education: N/A   Occupational History  . [place] History Main Topics  . Smoking status: Former Research scientist (life sciences)  . Smokeless tobacco: Never Used     Comment: as a teenager  . Alcohol use No  . Drug use: No  . Sexual activity: Yes   Other Topics Concern  . None   Social History Narrative   Patient gets regular exercise   Married x 42 years   Family History  Problem Relation Age of Onset  . Cancer Mother     colon  . Colon polyps Mother   . Colon cancer Mother 37  . Cancer Father     colon and  prostate cancer  . Colon polyps Father   . Colon cancer Father 65  . Cancer Paternal Uncle     colon  . Colon cancer Paternal Uncle 18  . Colon cancer Paternal Uncle 61  . Hypertension Other   . Cancer Cousin     colon  . Stomach cancer Neg Hx       Review of Systems: Pertinent positive and negative review of systems were noted in the above HPI section. All other review of systems were otherwise negative.   Physical Exam: General: Well developed, well nourished, no acute distress Head: Normocephalic and atraumatic Eyes:  sclerae anicteric, EOMI Ears: Normal auditory acuity Mouth: No deformity or lesions Neck: Supple, no masses or thyromegaly Lungs: Clear throughout to auscultation Heart: Regular rate and rhythm; no murmurs, rubs or bruits Abdomen: Soft, non tender and non distended. No masses, hepatosplenomegaly or hernias noted. Normal Bowel sounds Rectal: deferred to colonoscopy Musculoskeletal: Symmetrical with no gross deformities  Skin: No lesions on visible extremities Pulses:  Normal pulses noted Extremities: No clubbing, cyanosis, edema or deformities noted Neurological: Alert oriented x 4, grossly nonfocal Cervical Nodes:  No significant cervical adenopathy Inguinal Nodes: No significant inguinal adenopathy Psychological:  Alert and cooperative. Normal mood and affect  Assessment and Recommendations:  1. Iron deficiency anemia. R/O occult GI losses vs poor absorption. Check IgA, tTG, Fe, TIBC, ferritin, CBC, Hemoccults. Begin iron twice daily with meals. Hold iron for 5 days prior to colonoscopy. Schedule colonoscopy and EGD. The risks (including bleeding, perforation, infection, missed lesions, medication reactions and possible hospitalization or surgery if complications occur), benefits, and alternatives to colonoscopy with possible biopsy and possible polypectomy were discussed with the patient and they consent to proceed. The risks (including bleeding, perforation,  infection, missed lesions, medication reactions and possible hospitalization or surgery if complications occur), benefits, and alternatives to endoscopy with possible biopsy and possible dilation were discussed with the patient and they consent to proceed. Pt desires to keep his health information private and not to share with his care partner.   2. Barrett's esophagus without dysplasia. 2 year overdue for surveillance EGD. EGD as above. Continue pantoprazole 40 mg once daily and standard antireflux measures  3. Family history of colon cancer: father, mother, aunt, uncle. Colonoscopy as above.   4. History of GUs with bleeding. Continue pantoprazole 40 mg once daily.    cc: Cassandria Anger, MD [address] [place], [place] [address]

## 2017-03-12 LAB — TISSUE TRANSGLUTAMINASE, IGA: Tissue Transglutaminase Ab, IgA: 2 U/mL (ref <4)

## 2017-03-16 ENCOUNTER — Other Ambulatory Visit: Payer: Self-pay | Admitting: Internal Medicine

## 2017-03-21 ENCOUNTER — Other Ambulatory Visit: Payer: Self-pay | Admitting: Internal Medicine

## 2017-03-25 ENCOUNTER — Other Ambulatory Visit (INDEPENDENT_AMBULATORY_CARE_PROVIDER_SITE_OTHER): Payer: Medicare Other

## 2017-03-25 DIAGNOSIS — K2270 Barrett's esophagus without dysplasia: Secondary | ICD-10-CM

## 2017-03-25 DIAGNOSIS — D509 Iron deficiency anemia, unspecified: Secondary | ICD-10-CM | POA: Diagnosis not present

## 2017-03-25 DIAGNOSIS — Z800 Family history of malignant neoplasm of digestive organs: Secondary | ICD-10-CM

## 2017-03-25 LAB — HEMOCCULT SLIDES (X 3 CARDS)
Fecal Occult Blood: NEGATIVE
OCCULT 1: NEGATIVE
OCCULT 2: NEGATIVE
OCCULT 3: NEGATIVE
OCCULT 4: NEGATIVE
OCCULT 5: NEGATIVE

## 2017-04-10 ENCOUNTER — Encounter: Payer: Self-pay | Admitting: Gastroenterology

## 2017-04-10 ENCOUNTER — Ambulatory Visit (AMBULATORY_SURGERY_CENTER): Payer: Medicare Other | Admitting: Gastroenterology

## 2017-04-10 VITALS — BP 149/89 | HR 83 | Temp 98.0°F | Resp 14 | Ht 66.0 in | Wt 217.0 lb

## 2017-04-10 DIAGNOSIS — K635 Polyp of colon: Secondary | ICD-10-CM | POA: Diagnosis not present

## 2017-04-10 DIAGNOSIS — D123 Benign neoplasm of transverse colon: Secondary | ICD-10-CM | POA: Diagnosis not present

## 2017-04-10 DIAGNOSIS — K2970 Gastritis, unspecified, without bleeding: Secondary | ICD-10-CM

## 2017-04-10 DIAGNOSIS — Z800 Family history of malignant neoplasm of digestive organs: Secondary | ICD-10-CM | POA: Diagnosis not present

## 2017-04-10 DIAGNOSIS — Z86010 Personal history of colonic polyps: Secondary | ICD-10-CM | POA: Diagnosis not present

## 2017-04-10 DIAGNOSIS — K5730 Diverticulosis of large intestine without perforation or abscess without bleeding: Secondary | ICD-10-CM | POA: Diagnosis not present

## 2017-04-10 DIAGNOSIS — K648 Other hemorrhoids: Secondary | ICD-10-CM | POA: Diagnosis not present

## 2017-04-10 DIAGNOSIS — K2270 Barrett's esophagus without dysplasia: Secondary | ICD-10-CM | POA: Diagnosis not present

## 2017-04-10 DIAGNOSIS — K2950 Unspecified chronic gastritis without bleeding: Secondary | ICD-10-CM

## 2017-04-10 DIAGNOSIS — D120 Benign neoplasm of cecum: Secondary | ICD-10-CM

## 2017-04-10 DIAGNOSIS — D508 Other iron deficiency anemias: Secondary | ICD-10-CM

## 2017-04-10 DIAGNOSIS — K2990 Gastroduodenitis, unspecified, without bleeding: Secondary | ICD-10-CM

## 2017-04-10 DIAGNOSIS — D649 Anemia, unspecified: Secondary | ICD-10-CM | POA: Diagnosis not present

## 2017-04-10 DIAGNOSIS — D509 Iron deficiency anemia, unspecified: Secondary | ICD-10-CM | POA: Diagnosis not present

## 2017-04-10 DIAGNOSIS — K3189 Other diseases of stomach and duodenum: Secondary | ICD-10-CM | POA: Diagnosis not present

## 2017-04-10 MED ORDER — PANTOPRAZOLE SODIUM 40 MG PO TBEC: 40.0000 mg | DELAYED_RELEASE_TABLET | Freq: Two times a day (BID) | ORAL | 3 refills | Status: DC

## 2017-04-10 MED ORDER — SODIUM CHLORIDE 0.9 % IV SOLN: 500.0000 mL | INTRAVENOUS | Status: DC

## 2017-04-10 NOTE — Op Note (Signed)
[facility] Patient Name: Cody Frazier Procedure Date: 04/10/2017 2:29 PM MRN: [id number] Endoscopist: Ladene Artist , MD Age: 71 Referring MD:  Date of Birth: September 24, 1946 Gender: Male Account #: [id number] Procedure:                Upper GI endoscopy Indications:              Surveillance for malignancy due to personal history                            of Barrett's esophagus, Iron deficiency anemia Medicines:                Monitored Anesthesia Care Procedure:                Pre-Anesthesia Assessment:                           - Prior to the procedure, a History and Physical                            was performed, and patient medications and                            allergies were reviewed. The patient's tolerance of                            previous anesthesia was also reviewed. The risks                            and benefits of the procedure and the sedation                            options and risks were discussed with the patient.                            All questions were answered, and informed consent                            was obtained. Prior Anticoagulants: The patient has                            taken no previous anticoagulant or antiplatelet                            agents. ASA Grade Assessment: III - A patient with                            severe systemic disease. After reviewing the risks                            and benefits, the patient was deemed in                            satisfactory condition to undergo the procedure.  After obtaining informed consent, the endoscope was                            passed under direct vision. Throughout the                            procedure, the patient's blood pressure, pulse, and                            oxygen saturations were monitored continuously. The                            Model GIF-HQ190 [id number]) scope was introduced   through the mouth, and advanced to the second part                            of duodenum. The upper GI endoscopy was                            accomplished without difficulty. The patient                            tolerated the procedure well. Scope In: Scope Out: Findings:                 There were esophageal mucosal changes secondary to                            established long-segment Barrett's disease present                            in the distal esophagus. The maximum longitudinal                            extent of these mucosal changes was 3 cm in length.                            Mucosa was biopsied with a cold forceps for                            histology every 1 cm. One specimen bottle was sent                            to pathology.                           The exam of the esophagus was otherwise normal.                           Diffuse severe inflammation characterized by                            congestion (edema), erythema, friability and  granularity was found in the entire examined                            stomach. Biopsies were taken with a cold forceps                            for histology.                           Evidence of a fundoplication was found in the                            cardia/fundus. The wrap appeared loose. This was                            traversed.                           A small hiatal hernia was present.                           The exam of the stomach was otherwise normal.                           The duodenal bulb and second portion of the                            duodenum were normal. Complications:            No immediate complications. Estimated Blood Loss:     Estimated blood loss was minimal. Impression:               - Esophageal mucosal changes secondary to                            established long-segment Barrett's disease.                            Biopsied.                            - Gastritis. Biopsied.                           - A fundoplication was found in the cardia/fundus.                            The wrap appears loose.                           - Small hiatal hernia.                           - Normal duodenal bulb and second portion of the                            duodenum. Recommendation:           -  Patient has a contact number available for                            emergencies. The signs and symptoms of potential                            delayed complications were discussed with the                            patient. Return to normal activities tomorrow.                            Written discharge instructions were provided to the                            patient.                           - Resume previous diet.                           - Continue present medications.                           - Await pathology results.                           - Repeat upper endoscopy in 3 years for                            surveillance pending pathology review.                           - Protonix (pantoprazole) 40 mg PO BID                            indefinitely, 1 year of refills. Ladene Artist, MD 04/10/2017 3:14:00 PM This report has been signed electronically.

## 2017-04-10 NOTE — Progress Notes (Signed)
A/ox3 pleased with MAC, report to Suzanne RN 

## 2017-04-10 NOTE — Patient Instructions (Signed)
YOU HAD AN ENDOSCOPIC PROCEDURE TODAY AT [facility]:   Refer to the procedure report that was given to you for any specific questions about what was found during the examination.  If the procedure report does not answer your questions, please call your gastroenterologist to clarify.  If you requested that your care partner not be given the details of your procedure findings, then the procedure report has been included in a sealed envelope for you to review at your convenience later.  YOU SHOULD EXPECT: Some feelings of bloating in the abdomen. Passage of more gas than usual.  Walking can help get rid of the air that was put into your GI tract during the procedure and reduce the bloating. If you had a lower endoscopy (such as a colonoscopy or flexible sigmoidoscopy) you may notice spotting of blood in your stool or on the toilet paper. If you underwent a bowel prep for your procedure, you may not have a normal bowel movement for a few days.  Please Note:  You might notice some irritation and congestion in your nose or some drainage.  This is from the oxygen used during your procedure.  There is no need for concern and it should clear up in a day or so.  SYMPTOMS TO REPORT IMMEDIATELY:   Following lower endoscopy (colonoscopy or flexible sigmoidoscopy):  Excessive amounts of blood in the stool  Significant tenderness or worsening of abdominal pains  Swelling of the abdomen that is new, acute  Fever of 100F or higher   Following upper endoscopy (EGD)  Vomiting of blood or coffee ground material  New chest pain or pain under the shoulder blades  Painful or persistently difficult swallowing  New shortness of breath  Fever of 100F or higher  Black, tarry-looking stools  For urgent or emergent issues, a gastroenterologist can be reached at any hour by calling [phone].   DIET:  We do recommend a small meal at first, but then you may proceed to your regular diet.  Drink  plenty of fluids but you should avoid alcoholic beverages for 24 hours. Try to increase the fiber in your diet, and drink plenty of water.   Try to follow the GERD diet to decrease the acid in your stomach.  ACTIVITY:  You should plan to take it easy for the rest of today and you should NOT DRIVE or use heavy machinery until tomorrow (because of the sedation medicines used during the test).    FOLLOW UP: Our staff will call the number listed on your records the next business day following your procedure to check on you and address any questions or concerns that you may have regarding the information given to you following your procedure. If we do not reach you, we will leave a message.  However, if you are feeling well and you are not experiencing any problems, there is no need to return our call.  We will assume that you have returned to your regular daily activities without incident.  If any biopsies were taken you will be contacted by phone or by letter within the next 1-3 weeks.  Please call us at [phone] if you have not heard about the biopsies in 3 weeks.    SIGNATURES/CONFIDENTIALITY: You and/or your care partner have signed paperwork which will be entered into your electronic medical record.  These signatures attest to the fact that that the information above on your After Visit Summary has been reviewed and is understood.  Full responsibility of the confidentiality of this discharge information lies with you and/or your care-partner.  Take your protonix 40mg  twice daily. 1/2 hour before breakfast an 1/2 hour before bed.   Take on an empty stomach not a full stomach.  You will need another colonoscopy in 5 years, and another EGD in 3 years.

## 2017-04-10 NOTE — Op Note (Signed)
[facility] Patient Name: Cody Frazier Procedure Date: 04/10/2017 2:30 PM MRN: [id number] Endoscopist: Ladene Artist , MD Age: 71 Referring MD:  Date of Birth: [birth year] Gender: Male Account #: [id number] Procedure:                Colonoscopy Indications:              Screening in patient at increased risk: Family                            history of 1st-degree relative with colorectal                            cancer, Colon cancer screening in patient at                            increased risk: Family history of colorectal cancer                            in multiple 2nd degree relatives, Surveillance:                            Personal history of adenomatous polyps on last                            colonoscopy 5 years ago. IDA. Medicines:                Monitored Anesthesia Care Procedure:                Pre-Anesthesia Assessment:                           - Prior to the procedure, a History and Physical                            was performed, and patient medications and                            allergies were reviewed. The patient's tolerance of                            previous anesthesia was also reviewed. The risks                            and benefits of the procedure and the sedation                            options and risks were discussed with the patient.                            All questions were answered, and informed consent                            was obtained. Prior Anticoagulants: The patient has  taken no previous anticoagulant or antiplatelet                            agents. ASA Grade Assessment: III - A patient with                            severe systemic disease. After reviewing the risks                            and benefits, the patient was deemed in                            satisfactory condition to undergo the procedure.                           After obtaining informed consent, the  colonoscope                            was passed under direct vision. Throughout the                            procedure, the patient's blood pressure, pulse, and                            oxygen saturations were monitored continuously. The                            Colonoscope was introduced through the anus and                            advanced to the the cecum, identified by                            appendiceal orifice and ileocecal valve. The                            ileocecal valve, appendiceal orifice, and rectum                            were photographed. The quality of the bowel                            preparation was good. The colonoscopy was performed                            without difficulty. The patient tolerated the                            procedure well. Scope In: 2:34:45 PM Scope Out: 2:51:56 PM Scope Withdrawal Time: 0 hours 13 minutes 33 seconds  Total Procedure Duration: 0 hours 17 minutes 11 seconds  Findings:                 The perianal and digital rectal examinations were  normal.                           Two sessile polyps were found in the transverse                            colon and cecum. The polyps were 6 mm in size.                            These polyps were removed with a cold snare.                            Resection and retrieval were complete.                           Multiple medium-mouthed diverticula were found in                            the left colon. There was narrowing of the colon in                            association with the diverticular opening. There                            was evidence of diverticular spasm. There was no                            evidence of diverticular bleeding.                           A few small-mouthed diverticula were found in the                            ascending colon. There was no evidence of                            diverticular bleeding.                            Internal hemorrhoids were found during                            retroflexion. The hemorrhoids were small and Grade                            I (internal hemorrhoids that do not prolapse).                           The exam was otherwise without abnormality on                            direct and retroflexion views. Complications:            No immediate complications. Estimated blood loss:  None. Estimated Blood Loss:     Estimated blood loss: none. Impression:               - Two 6 mm polyps in the transverse colon and in                            the ascending colon, removed with a cold snare.                            Resected and retrieved.                           - Moderate diverticulosis in the left colon.                           - Mild diverticulosis in the ascending colon.                           - Internal hemorrhoids                           The examination was otherwise normal on direct and                            retroflexion views. Recommendation:           - Repeat colonoscopy in 5 years for surveillance.                           - Patient has a contact number available for                            emergencies. The signs and symptoms of potential                            delayed complications were discussed with the                            patient. Return to normal activities tomorrow.                            Written discharge instructions were provided to the                            patient.                           - Resume previous diet.                           - Continue present medications.                           - Await pathology results. Ladene Artist, MD 04/10/2017 3:07:06 PM This report has been signed electronically.

## 2017-04-10 NOTE — Progress Notes (Signed)
Pt's states no medical or surgical changes since previsit or office visit. 

## 2017-04-10 NOTE — Progress Notes (Signed)
Called to room to assist during endoscopic procedure.  Patient ID and intended procedure confirmed with present staff. Received instructions for my participation in the procedure from the performing physician.  

## 2017-04-11 ENCOUNTER — Telehealth: Payer: Self-pay

## 2017-04-11 NOTE — Telephone Encounter (Signed)
  Follow up Call-  Call back number 04/10/2017  Post procedure Call Back phone  # [phone]  Permission to leave phone message Yes  Some recent data might be hidden     Patient questions:  Do you have a fever, pain , or abdominal swelling? No. Pain Score  0 *  Have you tolerated food without any problems? Yes.    Have you been able to return to your normal activities? Yes.    Do you have any questions about your discharge instructions: Diet   No. Medications  No. Follow up visit  No.  Do you have questions or concerns about your Care? No.  Actions: * If pain score is 4 or above: No action needed, pain <4.

## 2017-04-18 ENCOUNTER — Telehealth: Payer: Self-pay | Admitting: Gastroenterology

## 2017-04-18 NOTE — Telephone Encounter (Signed)
Informed Cody Frazier we have not received the fax PA from them. Cody Frazier states it does not give me contact information to do the PA. I asked Cody Frazier for the [facility] # [id number], [facility] # [id number], and [facility] # [id number] to initiate PA on cover my meds online. Initiated PA and awaiting response from [facility].

## 2017-04-19 NOTE — Telephone Encounter (Signed)
Received fax from [facility]. Stating pantoprazole bid is approved until 11/11/17. PA ref # [id number]

## 2017-04-25 ENCOUNTER — Encounter: Payer: Self-pay | Admitting: Gastroenterology

## 2017-05-03 ENCOUNTER — Ambulatory Visit (INDEPENDENT_AMBULATORY_CARE_PROVIDER_SITE_OTHER): Payer: Medicare Other | Admitting: Pulmonary Disease

## 2017-05-03 ENCOUNTER — Encounter: Payer: Self-pay | Admitting: Pulmonary Disease

## 2017-05-03 VITALS — BP 118/80 | HR 91 | Ht 66.0 in | Wt 214.0 lb

## 2017-05-03 DIAGNOSIS — G4733 Obstructive sleep apnea (adult) (pediatric): Secondary | ICD-10-CM

## 2017-05-03 DIAGNOSIS — R0683 Snoring: Secondary | ICD-10-CM | POA: Diagnosis not present

## 2017-05-03 NOTE — Assessment & Plan Note (Signed)
Given excessive daytime somnolence, narrow pharyngeal exam, witnessed apneas & loud snoring, obstructive sleep apnea is very likely & an overnight polysomnogram will be scheduled as a split study. The pathophysiology of obstructive sleep apnea , it's cardiovascular consequences & modes of treatment including CPAP were discused with the patient in detail & they evidenced understanding.  Pretest probability is intermediate 

## 2017-05-03 NOTE — Patient Instructions (Signed)
Schedule sleep study did We discussed treatment options

## 2017-05-03 NOTE — Progress Notes (Signed)
Subjective:    Patient ID: Cody Frazier, male    DOB: 01/20/1946, 71 y.o.   MRN: [id number]  HPI  Chief Complaint  Patient presents with  . Sleep Consult    Has had a problem with snoring at night. Dr. Alain Marion prescribed nasal spray and it has helped with the snoring. Denies ever having a sleep study performed before.     71 year old hypertensive presents for evaluation of sleep-disordered breathing. His wife has noted loud snoring but not witnessed apneas. He reports occasional gasping episodes that wake him up from sleep. He was given Flonase by his PCP and this has apparently decreased his snoring. Epworth sleepiness score is 4, he leads a retired incidentally lifestyle. Bedtime is between 11:30 and midnight, sleep latency is about 10 minutes, he sleeps on his right side with 2 pillows, reports 2-3 nocturnal awakenings including nocturia and is out of bed by 9 AM feeling rested with occasional dryness of mouth but denies headache. He naps in his recliner for about 30 minutes daily and naps are refreshing. He is gained about 10 pounds in the last 2 years There is no history suggestive of cataplexy, sleep paralysis or parasomnias   He reports dyspnea on exertion, underwent  negative cardiac stress test and was found to have low hemoglobin of 10.6, he went underwent colonoscopy and is on iron supplementation   Past Medical History:  Diagnosis Date  . Allergic rhinitis   . Anemia, iron deficiency   . Atrial arrhythmia   . Atrial fib/flutter, transient    following prior surgeries x 2  . Barrett's esophagus without dysplasia   . BPH (benign prostatic hyperplasia)   . Chronic LBP   . DDD (degenerative disc disease)   . Diverticulosis of colon   . Duodenal stricture   . Gastric outlet obstruction   . GERD (gastroesophageal reflux disease)   . Hemorrhoids   . Hyperlipidemia   . Hyperplastic colonic polyp 01/2000  . Hypertension   . Nephrolithiasis    hx of B  . Peptic  ulcer disease with hemorrhage 08/2008   and GOO H. Pylori Ab negative  . Renal cyst   . Tubular adenoma of colon 03/2012    Past Surgical History:  Procedure Laterality Date  . APPENDECTOMY    . BACK SURGERY  02/2003   L5  . BILROTH I PROCEDURE  2011   Dr Johney Maine  . HIATAL HERNIA REPAIR    . LUMBAR FUSION  2009    Allergies  Allergen Reactions  . Acetaminophen     REACTION: ?? ulcers  . Codeine Phosphate Swelling     Social History   Social History  . Marital status: Married    Spouse name: Mahamud Metts  . Number of children: N/A  . Years of education: N/A   Occupational History  . [place] History Main Topics  . Smoking status: Former Research scientist (life sciences)  . Smokeless tobacco: Never Used     Comment: as a teenager  . Alcohol use No  . Drug use: No  . Sexual activity: Yes   Other Topics Concern  . Not on file   Social History Narrative   Patient gets regular exercise   Married x 42 years     Family History  Problem Relation Age of Onset  . Cancer Mother        colon  . Colon polyps Mother   . Colon cancer Mother 35  . Cancer Father  colon and prostate cancer  . Colon polyps Father   . Colon cancer Father 42  . Cancer Paternal Uncle        colon  . Colon cancer Paternal Uncle 28  . Colon cancer Paternal Uncle 79  . Hypertension Other   . Cancer Cousin        colon  . Stomach cancer Neg Hx      Review of Systems   Constitutional: negative for anorexia, fevers and sweats  Eyes: negative for irritation, redness and visual disturbance  Ears, nose, mouth, throat, and face: negative for earaches, epistaxis, nasal congestion and sore throat  Respiratory: negative for cough, dyspnea on exertion, sputum and wheezing  Cardiovascular: negative for chest pain, dyspnea, lower extremity edema, orthopnea, palpitations and syncope  Gastrointestinal: negative for abdominal pain, constipation, diarrhea, melena, nausea and vomiting    Genitourinary:negative for dysuria, frequency and hematuria  Hematologic/lymphatic: negative for bleeding, easy bruising and lymphadenopathy  Musculoskeletal:negative for arthralgias, muscle weakness and stiff joints  Neurological: negative for coordination problems, gait problems, headaches and weakness  Endocrine: negative for diabetic symptoms including polydipsia, polyuria and weight loss     Objective:   Physical Exam  Gen. Pleasant, obese, in no distress, normal affect ENT - no lesions, no post nasal drip, class 2-3 airway Neck: No JVD, no thyromegaly, no carotid bruits Lungs: no use of accessory muscles, no dullness to percussion, decreased without rales or rhonchi  Cardiovascular: Rhythm regular, heart sounds  normal, no murmurs or gallops, no peripheral edema Abdomen: soft and non-tender, no hepatosplenomegaly, BS normal. Musculoskeletal: No deformities, no cyanosis or clubbing Neuro:  alert, non focal, no tremors       Assessment & Plan:

## 2017-05-10 ENCOUNTER — Emergency Department (HOSPITAL_COMMUNITY): Payer: Medicare Other

## 2017-05-10 ENCOUNTER — Encounter (HOSPITAL_COMMUNITY): Payer: Self-pay | Admitting: Emergency Medicine

## 2017-05-10 ENCOUNTER — Inpatient Hospital Stay (HOSPITAL_COMMUNITY): Payer: Medicare Other | Admitting: Anesthesiology

## 2017-05-10 ENCOUNTER — Inpatient Hospital Stay (HOSPITAL_COMMUNITY)
Admission: EM | Admit: 2017-05-10 | Discharge: 2017-05-16 | DRG: 853 | Disposition: A | Discharge status: Home / Self Care | Payer: Medicare Other | Attending: Internal Medicine | Admitting: Internal Medicine

## 2017-05-10 ENCOUNTER — Encounter (HOSPITAL_COMMUNITY)
Admission: EM | Disposition: A | Discharge status: Home / Self Care | Payer: Self-pay | Source: Home / Self Care | Attending: Internal Medicine

## 2017-05-10 DIAGNOSIS — Z888 Allergy status to other drugs, medicaments and biological substances status: Secondary | ICD-10-CM

## 2017-05-10 DIAGNOSIS — Z79899 Other long term (current) drug therapy: Secondary | ICD-10-CM | POA: Diagnosis not present

## 2017-05-10 DIAGNOSIS — E876 Hypokalemia: Secondary | ICD-10-CM | POA: Diagnosis not present

## 2017-05-10 DIAGNOSIS — K219 Gastro-esophageal reflux disease without esophagitis: Secondary | ICD-10-CM | POA: Diagnosis present

## 2017-05-10 DIAGNOSIS — I480 Paroxysmal atrial fibrillation: Secondary | ICD-10-CM | POA: Diagnosis present

## 2017-05-10 DIAGNOSIS — R6520 Severe sepsis without septic shock: Secondary | ICD-10-CM | POA: Diagnosis present

## 2017-05-10 DIAGNOSIS — K5790 Diverticulosis of intestine, part unspecified, without perforation or abscess without bleeding: Secondary | ICD-10-CM | POA: Diagnosis present

## 2017-05-10 DIAGNOSIS — E872 Acidosis, unspecified: Secondary | ICD-10-CM

## 2017-05-10 DIAGNOSIS — K922 Gastrointestinal hemorrhage, unspecified: Secondary | ICD-10-CM | POA: Diagnosis not present

## 2017-05-10 DIAGNOSIS — R0602 Shortness of breath: Secondary | ICD-10-CM | POA: Diagnosis not present

## 2017-05-10 DIAGNOSIS — I4891 Unspecified atrial fibrillation: Secondary | ICD-10-CM

## 2017-05-10 DIAGNOSIS — I119 Hypertensive heart disease without heart failure: Secondary | ICD-10-CM | POA: Diagnosis present

## 2017-05-10 DIAGNOSIS — L8990 Pressure ulcer of unspecified site, unspecified stage: Secondary | ICD-10-CM | POA: Insufficient documentation

## 2017-05-10 DIAGNOSIS — N200 Calculus of kidney: Secondary | ICD-10-CM | POA: Diagnosis present

## 2017-05-10 DIAGNOSIS — E860 Dehydration: Secondary | ICD-10-CM | POA: Diagnosis not present

## 2017-05-10 DIAGNOSIS — I10 Essential (primary) hypertension: Secondary | ICD-10-CM

## 2017-05-10 DIAGNOSIS — I4892 Unspecified atrial flutter: Secondary | ICD-10-CM | POA: Diagnosis present

## 2017-05-10 DIAGNOSIS — K2270 Barrett's esophagus without dysplasia: Secondary | ICD-10-CM | POA: Diagnosis present

## 2017-05-10 DIAGNOSIS — K458 Other specified abdominal hernia without obstruction or gangrene: Secondary | ICD-10-CM

## 2017-05-10 DIAGNOSIS — R571 Hypovolemic shock: Secondary | ICD-10-CM

## 2017-05-10 DIAGNOSIS — R112 Nausea with vomiting, unspecified: Secondary | ICD-10-CM | POA: Diagnosis not present

## 2017-05-10 DIAGNOSIS — K56609 Unspecified intestinal obstruction, unspecified as to partial versus complete obstruction: Secondary | ICD-10-CM | POA: Diagnosis not present

## 2017-05-10 DIAGNOSIS — K469 Unspecified abdominal hernia without obstruction or gangrene: Secondary | ICD-10-CM | POA: Diagnosis present

## 2017-05-10 DIAGNOSIS — N179 Acute kidney failure, unspecified: Secondary | ICD-10-CM | POA: Diagnosis not present

## 2017-05-10 DIAGNOSIS — Z981 Arthrodesis status: Secondary | ICD-10-CM

## 2017-05-10 DIAGNOSIS — I700 Atherosclerosis of aorta: Secondary | ICD-10-CM

## 2017-05-10 DIAGNOSIS — I340 Nonrheumatic mitral (valve) insufficiency: Secondary | ICD-10-CM | POA: Diagnosis not present

## 2017-05-10 DIAGNOSIS — E669 Obesity, unspecified: Secondary | ICD-10-CM | POA: Diagnosis present

## 2017-05-10 DIAGNOSIS — R1011 Right upper quadrant pain: Secondary | ICD-10-CM | POA: Diagnosis not present

## 2017-05-10 DIAGNOSIS — I998 Other disorder of circulatory system: Secondary | ICD-10-CM | POA: Diagnosis not present

## 2017-05-10 DIAGNOSIS — Z87891 Personal history of nicotine dependence: Secondary | ICD-10-CM | POA: Diagnosis not present

## 2017-05-10 DIAGNOSIS — R1084 Generalized abdominal pain: Secondary | ICD-10-CM | POA: Diagnosis not present

## 2017-05-10 DIAGNOSIS — R000 Tachycardia, unspecified: Secondary | ICD-10-CM | POA: Diagnosis not present

## 2017-05-10 DIAGNOSIS — A419 Sepsis, unspecified organism: Secondary | ICD-10-CM | POA: Diagnosis not present

## 2017-05-10 DIAGNOSIS — K660 Peritoneal adhesions (postprocedural) (postinfection): Secondary | ICD-10-CM | POA: Diagnosis not present

## 2017-05-10 DIAGNOSIS — T8119XA Other postprocedural shock, initial encounter: Secondary | ICD-10-CM | POA: Diagnosis not present

## 2017-05-10 DIAGNOSIS — Z8711 Personal history of peptic ulcer disease: Secondary | ICD-10-CM | POA: Diagnosis not present

## 2017-05-10 HISTORY — PX: LAPAROTOMY: SHX154

## 2017-05-10 LAB — CBC
HCT: 52.3 % — ABNORMAL HIGH (ref 39.0–52.0)
HCT: 44.5 % (ref 39.0–52.0)
Hemoglobin: 17.8 g/dL — ABNORMAL HIGH (ref 13.0–17.0)
Hemoglobin: 15.5 g/dL (ref 13.0–17.0)
MCH: 30 pg (ref 26.0–34.0)
MCH: 29.8 pg (ref 26.0–34.0)
MCHC: 34 g/dL (ref 30.0–36.0)
MCHC: 34.8 g/dL (ref 30.0–36.0)
MCV: 88.2 fL (ref 78.0–100.0)
MCV: 85.4 fL (ref 78.0–100.0)
Platelets: 347 10*3/uL (ref 150–400)
Platelets: 274 10*3/uL (ref 150–400)
RBC: 5.93 MIL/uL — ABNORMAL HIGH (ref 4.22–5.81)
RBC: 5.21 MIL/uL (ref 4.22–5.81)
RDW: 15.5 % (ref 11.5–15.5)
RDW: 15.8 % — ABNORMAL HIGH (ref 11.5–15.5)
WBC: 24.2 10*3/uL — ABNORMAL HIGH (ref 4.0–10.5)
WBC: 21.9 10*3/uL — ABNORMAL HIGH (ref 4.0–10.5)

## 2017-05-10 LAB — COMPREHENSIVE METABOLIC PANEL
ALT: 21 U/L (ref 17–63)
AST: 47 U/L — ABNORMAL HIGH (ref 15–41)
Albumin: 4.2 g/dL (ref 3.5–5.0)
Alkaline Phosphatase: 113 U/L (ref 38–126)
Anion gap: 24 — ABNORMAL HIGH (ref 5–15)
BUN: 17 mg/dL (ref 6–20)
CO2: 14 mmol/L — ABNORMAL LOW (ref 22–32)
Calcium: 10.4 mg/dL — ABNORMAL HIGH (ref 8.9–10.3)
Chloride: 102 mmol/L (ref 101–111)
Creatinine, Ser: 1.88 mg/dL — ABNORMAL HIGH (ref 0.61–1.24)
GFR calc Af Amer: 40 mL/min — ABNORMAL LOW (ref >60)
GFR calc non Af Amer: 34 mL/min — ABNORMAL LOW (ref >60)
Glucose, Bld: 380 mg/dL — ABNORMAL HIGH (ref 65–99)
Potassium: 3.6 mmol/L (ref 3.5–5.1)
Sodium: 140 mmol/L (ref 135–145)
Total Bilirubin: 0.6 mg/dL (ref 0.3–1.2)
Total Protein: 7.9 g/dL (ref 6.5–8.1)

## 2017-05-10 LAB — URINALYSIS, ROUTINE W REFLEX MICROSCOPIC
Bilirubin Urine: NEGATIVE
Glucose, UA: NEGATIVE mg/dL
Hgb urine dipstick: NEGATIVE
Ketones, ur: NEGATIVE mg/dL
Leukocytes, UA: NEGATIVE
Nitrite: NEGATIVE
Protein, ur: 30 mg/dL — AB
Specific Gravity, Urine: >1.046 — ABNORMAL HIGH (ref 1.005–1.030)
pH: 5 (ref 5.0–8.0)

## 2017-05-10 LAB — BLOOD GAS, VENOUS
Acid-base deficit: 3.6 mmol/L — ABNORMAL HIGH (ref 0.0–2.0)
Bicarbonate: 19.4 mmol/L — ABNORMAL LOW (ref 20.0–28.0)
O2 Saturation: 90.8 %
Patient temperature: 98.6
pCO2, Ven: 31.4 mmHg — ABNORMAL LOW (ref 44.0–60.0)
pH, Ven: 7.408 (ref 7.250–7.430)
pO2, Ven: 62 mmHg — ABNORMAL HIGH (ref 32.0–45.0)

## 2017-05-10 LAB — DIFFERENTIAL
Basophils Absolute: 0 10*3/uL (ref 0.0–0.1)
Basophils Relative: 0 %
Eosinophils Absolute: 0 10*3/uL (ref 0.0–0.7)
Eosinophils Relative: 0 %
Lymphocytes Relative: 6 %
Lymphs Abs: 1.5 10*3/uL (ref 0.7–4.0)
Monocytes Absolute: 1.2 10*3/uL — ABNORMAL HIGH (ref 0.1–1.0)
Monocytes Relative: 5 %
Neutro Abs: 21.7 10*3/uL — ABNORMAL HIGH (ref 1.7–7.7)
Neutrophils Relative %: 89 %

## 2017-05-10 LAB — I-STAT CHEM 8, ED
BUN: 21 mg/dL — ABNORMAL HIGH (ref 6–20)
BUN: 22 mg/dL — ABNORMAL HIGH (ref 6–20)
Calcium, Ion: 1.06 mmol/L — ABNORMAL LOW (ref 1.15–1.40)
Calcium, Ion: 1.17 mmol/L (ref 1.15–1.40)
Chloride: 111 mmol/L (ref 101–111)
Chloride: 109 mmol/L (ref 101–111)
Creatinine, Ser: 1.4 mg/dL — ABNORMAL HIGH (ref 0.61–1.24)
Creatinine, Ser: 1.5 mg/dL — ABNORMAL HIGH (ref 0.61–1.24)
Glucose, Bld: 316 mg/dL — ABNORMAL HIGH (ref 65–99)
Glucose, Bld: 144 mg/dL — ABNORMAL HIGH (ref 65–99)
HCT: 49 % (ref 39.0–52.0)
HCT: 47 % (ref 39.0–52.0)
Hemoglobin: 16.7 g/dL (ref 13.0–17.0)
Hemoglobin: 16 g/dL (ref 13.0–17.0)
Potassium: 3.7 mmol/L (ref 3.5–5.1)
Potassium: 4.4 mmol/L (ref 3.5–5.1)
Sodium: 140 mmol/L (ref 135–145)
Sodium: 140 mmol/L (ref 135–145)
TCO2: 13 mmol/L (ref 0–100)
TCO2: 20 mmol/L (ref 0–100)

## 2017-05-10 LAB — I-STAT CG4 LACTIC ACID, ED
Lactic Acid, Venous: 7.04 mmol/L (ref 0.5–1.9)
Lactic Acid, Venous: 3.6 mmol/L (ref 0.5–1.9)

## 2017-05-10 LAB — PREPARE RBC (CROSSMATCH)

## 2017-05-10 LAB — TSH: TSH: 1.401 u[IU]/mL (ref 0.350–4.500)

## 2017-05-10 LAB — TROPONIN I: Troponin I: <0.03 ng/mL (ref <0.03)

## 2017-05-10 LAB — PROTIME-INR
INR: 1.11
Prothrombin Time: 14.3 seconds (ref 11.4–15.2)

## 2017-05-10 LAB — SURGICAL PCR SCREEN
MRSA, PCR: NEGATIVE
Staphylococcus aureus: POSITIVE — AB

## 2017-05-10 LAB — MAGNESIUM: Magnesium: 2.3 mg/dL (ref 1.7–2.4)

## 2017-05-10 LAB — APTT: aPTT: 31 seconds (ref 24–36)

## 2017-05-10 LAB — LIPASE, BLOOD: Lipase: 19 U/L (ref 11–51)

## 2017-05-10 SURGERY — LAPAROTOMY, EXPLORATORY
Anesthesia: General | Site: Abdomen

## 2017-05-10 MED ORDER — PROMETHAZINE HCL 25 MG/ML IJ SOLN: 6.2500 mg | INTRAMUSCULAR | Status: DC | PRN

## 2017-05-10 MED ORDER — HEPARIN (PORCINE) IN NACL 100-0.45 UNIT/ML-% IJ SOLN
1200.0000 [IU]/h | INTRAMUSCULAR | Status: DC
  Administered 2017-05-10: 1200 [IU]/h via INTRAVENOUS
  Filled 2017-05-10: qty 250

## 2017-05-10 MED ORDER — ALBUMIN HUMAN 5 % IV SOLN
INTRAVENOUS | Status: AC
  Filled 2017-05-10: qty 250

## 2017-05-10 MED ORDER — 0.9 % SODIUM CHLORIDE (POUR BTL) OPTIME
TOPICAL | Status: DC | PRN
  Administered 2017-05-10: 2000 mL

## 2017-05-10 MED ORDER — SODIUM CHLORIDE 0.9 % IV SOLN
INTRAVENOUS | Status: DC
  Administered 2017-05-10 – 2017-05-12 (×3): via INTRAVENOUS

## 2017-05-10 MED ORDER — ALBUMIN HUMAN 5 % IV SOLN
INTRAVENOUS | Status: DC | PRN
  Administered 2017-05-10: 21:00:00 via INTRAVENOUS

## 2017-05-10 MED ORDER — PANTOPRAZOLE SODIUM 40 MG IV SOLR
40.0000 mg | Freq: Two times a day (BID) | INTRAVENOUS | Status: DC
  Administered 2017-05-11 – 2017-05-14 (×8): 40 mg via INTRAVENOUS
  Filled 2017-05-10 (×8): qty 40

## 2017-05-10 MED ORDER — SODIUM CHLORIDE 0.9 % IV SOLN: Freq: Once | INTRAVENOUS | Status: DC

## 2017-05-10 MED ORDER — FENTANYL CITRATE (PF) 100 MCG/2ML IJ SOLN
25.0000 ug | INTRAMUSCULAR | Status: DC | PRN
  Administered 2017-05-10 (×5): 50 ug via INTRAVENOUS
  Administered 2017-05-10 – 2017-05-15 (×26): 25 ug via INTRAVENOUS
  Filled 2017-05-10 (×26): qty 2

## 2017-05-10 MED ORDER — HEPARIN BOLUS VIA INFUSION
4000.0000 [IU] | Freq: Once | INTRAVENOUS | Status: AC
  Administered 2017-05-10: 4000 [IU] via INTRAVENOUS
  Filled 2017-05-10: qty 4000

## 2017-05-10 MED ORDER — LACTATED RINGERS IV SOLN
INTRAVENOUS | Status: DC | PRN
  Administered 2017-05-10 (×3): via INTRAVENOUS

## 2017-05-10 MED ORDER — ROCURONIUM BROMIDE 50 MG/5ML IV SOSY
PREFILLED_SYRINGE | INTRAVENOUS | Status: AC
  Filled 2017-05-10: qty 5

## 2017-05-10 MED ORDER — SUCCINYLCHOLINE CHLORIDE 200 MG/10ML IV SOSY
PREFILLED_SYRINGE | INTRAVENOUS | Status: DC | PRN
  Administered 2017-05-10: 140 mg via INTRAVENOUS

## 2017-05-10 MED ORDER — SODIUM CHLORIDE 0.9% FLUSH
3.0000 mL | Freq: Two times a day (BID) | INTRAVENOUS | Status: DC
  Administered 2017-05-10 – 2017-05-16 (×10): 3 mL via INTRAVENOUS

## 2017-05-10 MED ORDER — PROPOFOL 10 MG/ML IV BOLUS
INTRAVENOUS | Status: AC
  Filled 2017-05-10: qty 20

## 2017-05-10 MED ORDER — FAMOTIDINE IN NACL 20-0.9 MG/50ML-% IV SOLN
20.0000 mg | Freq: Two times a day (BID) | INTRAVENOUS | Status: DC
  Administered 2017-05-11: 20 mg via INTRAVENOUS
  Filled 2017-05-10: qty 50

## 2017-05-10 MED ORDER — FAMOTIDINE IN NACL 20-0.9 MG/50ML-% IV SOLN
20.0000 mg | Freq: Once | INTRAVENOUS | Status: AC
  Administered 2017-05-10: 20 mg via INTRAVENOUS
  Filled 2017-05-10: qty 50

## 2017-05-10 MED ORDER — IOPAMIDOL (ISOVUE-300) INJECTION 61%
INTRAVENOUS | Status: AC
  Filled 2017-05-10: qty 100

## 2017-05-10 MED ORDER — SUGAMMADEX SODIUM 200 MG/2ML IV SOLN
INTRAVENOUS | Status: AC
  Filled 2017-05-10: qty 2

## 2017-05-10 MED ORDER — FENTANYL CITRATE (PF) 250 MCG/5ML IJ SOLN
INTRAMUSCULAR | Status: AC
  Filled 2017-05-10: qty 5

## 2017-05-10 MED ORDER — SUCCINYLCHOLINE CHLORIDE 200 MG/10ML IV SOSY
PREFILLED_SYRINGE | INTRAVENOUS | Status: AC
  Filled 2017-05-10: qty 10

## 2017-05-10 MED ORDER — ONDANSETRON HCL 4 MG/2ML IJ SOLN
INTRAMUSCULAR | Status: AC
  Filled 2017-05-10: qty 2

## 2017-05-10 MED ORDER — PHENYLEPHRINE 40 MCG/ML (10ML) SYRINGE FOR IV PUSH (FOR BLOOD PRESSURE SUPPORT)
PREFILLED_SYRINGE | INTRAVENOUS | Status: DC | PRN
  Administered 2017-05-10 (×7): 80 ug via INTRAVENOUS

## 2017-05-10 MED ORDER — HYDROMORPHONE HCL 2 MG/ML IJ SOLN
0.2500 mg | INTRAMUSCULAR | Status: DC | PRN
  Administered 2017-05-10 (×2): 0.5 mg via INTRAVENOUS

## 2017-05-10 MED ORDER — DEXAMETHASONE SODIUM PHOSPHATE 10 MG/ML IJ SOLN
INTRAMUSCULAR | Status: AC
  Filled 2017-05-10: qty 1

## 2017-05-10 MED ORDER — ROCURONIUM BROMIDE 10 MG/ML (PF) SYRINGE
PREFILLED_SYRINGE | INTRAVENOUS | Status: DC | PRN
  Administered 2017-05-10: 20 mg via INTRAVENOUS
  Administered 2017-05-10: 30 mg via INTRAVENOUS
  Administered 2017-05-10 (×2): 10 mg via INTRAVENOUS

## 2017-05-10 MED ORDER — LIDOCAINE 2% (20 MG/ML) 5 ML SYRINGE
INTRAMUSCULAR | Status: AC
  Filled 2017-05-10: qty 5

## 2017-05-10 MED ORDER — ACETAMINOPHEN 325 MG PO TABS
650.0000 mg | ORAL_TABLET | Freq: Four times a day (QID) | ORAL | Status: DC | PRN
  Administered 2017-05-16: 650 mg via ORAL
  Filled 2017-05-10: qty 2

## 2017-05-10 MED ORDER — LACTATED RINGERS IV BOLUS (SEPSIS)
1000.0000 mL | Freq: Once | INTRAVENOUS | Status: AC
  Administered 2017-05-10: 1000 mL via INTRAVENOUS

## 2017-05-10 MED ORDER — ONDANSETRON HCL 4 MG PO TABS
4.0000 mg | ORAL_TABLET | Freq: Four times a day (QID) | ORAL | Status: DC | PRN
Start: 2017-05-10 — End: 2017-05-13

## 2017-05-10 MED ORDER — SUGAMMADEX SODIUM 200 MG/2ML IV SOLN
INTRAVENOUS | Status: DC | PRN
  Administered 2017-05-10: 200 mg via INTRAVENOUS

## 2017-05-10 MED ORDER — PROPOFOL 10 MG/ML IV BOLUS
INTRAVENOUS | Status: DC | PRN
  Administered 2017-05-10: 150 mg via INTRAVENOUS

## 2017-05-10 MED ORDER — DEXAMETHASONE SODIUM PHOSPHATE 10 MG/ML IJ SOLN
INTRAMUSCULAR | Status: DC | PRN
  Administered 2017-05-10: 10 mg via INTRAVENOUS

## 2017-05-10 MED ORDER — HYDROMORPHONE HCL 2 MG/ML IJ SOLN
INTRAMUSCULAR | Status: AC
  Filled 2017-05-10: qty 1

## 2017-05-10 MED ORDER — PIPERACILLIN-TAZOBACTAM 3.375 G IVPB 30 MIN
3.3750 g | Freq: Once | INTRAVENOUS | Status: AC
  Administered 2017-05-10: 3.375 g via INTRAVENOUS
  Filled 2017-05-10: qty 50

## 2017-05-10 MED ORDER — PIPERACILLIN-TAZOBACTAM 3.375 G IVPB
3.3750 g | Freq: Three times a day (TID) | INTRAVENOUS | Status: DC
  Administered 2017-05-11 – 2017-05-13 (×7): 3.375 g via INTRAVENOUS
  Filled 2017-05-10 (×8): qty 50

## 2017-05-10 MED ORDER — ONDANSETRON HCL 4 MG/2ML IJ SOLN
4.0000 mg | Freq: Four times a day (QID) | INTRAMUSCULAR | Status: DC | PRN
  Administered 2017-05-10: 4 mg via INTRAVENOUS

## 2017-05-10 MED ORDER — ACETAMINOPHEN 650 MG RE SUPP: 650.0000 mg | Freq: Four times a day (QID) | RECTAL | Status: DC | PRN

## 2017-05-10 MED ORDER — LIDOCAINE 2% (20 MG/ML) 5 ML SYRINGE
INTRAMUSCULAR | Status: DC | PRN
  Administered 2017-05-10: 80 mg via INTRAVENOUS

## 2017-05-10 MED ORDER — LACTATED RINGERS IV BOLUS (SEPSIS)
2000.0000 mL | Freq: Once | INTRAVENOUS | Status: AC
  Administered 2017-05-10: 2000 mL via INTRAVENOUS

## 2017-05-10 MED ORDER — IOPAMIDOL (ISOVUE-300) INJECTION 61%
100.0000 mL | Freq: Once | INTRAVENOUS | Status: AC | PRN
  Administered 2017-05-10: 60 mL via INTRAVENOUS

## 2017-05-10 SURGICAL SUPPLY — 37 items
APPLICATOR COTTON TIP 6IN STRL (MISCELLANEOUS) ×2 IMPLANT
BLADE EXTENDED COATED 6.5IN (ELECTRODE) ×1 IMPLANT
BLADE HEX COATED 2.75 (ELECTRODE) ×2 IMPLANT
COVER MAYO STAND STRL (DRAPES) ×2 IMPLANT
COVER SURGICAL LIGHT HANDLE (MISCELLANEOUS) ×2 IMPLANT
DRAIN CHANNEL 19F RND (DRAIN) IMPLANT
DRAPE LAPAROSCOPIC ABDOMINAL (DRAPES) ×2 IMPLANT
DRAPE WARM FLUID 44X44 (DRAPE) ×2 IMPLANT
DRSG OPSITE POSTOP 4X10 (GAUZE/BANDAGES/DRESSINGS) ×1 IMPLANT
ELECT REM PT RETURN 15FT ADLT (MISCELLANEOUS) ×2 IMPLANT
EVACUATOR SILICONE 100CC (DRAIN) IMPLANT
GAUZE SPONGE 4X4 12PLY STRL (GAUZE/BANDAGES/DRESSINGS) ×2 IMPLANT
GLOVE BIOGEL M 8.0 STRL (GLOVE) ×2 IMPLANT
GOWN SPEC L4 XLG W/TWL (GOWN DISPOSABLE) ×2 IMPLANT
GOWN STRL REUS W/TWL XL LVL3 (GOWN DISPOSABLE) ×6 IMPLANT
HANDLE SUCTION POOLE (INSTRUMENTS) IMPLANT
KIT BASIN OR (CUSTOM PROCEDURE TRAY) ×2 IMPLANT
NS IRRIG 1000ML POUR BTL (IV SOLUTION) ×4 IMPLANT
PACK GENERAL/GYN (CUSTOM PROCEDURE TRAY) ×2 IMPLANT
SEALER TISSUE X1 CVD JAW (INSTRUMENTS) IMPLANT
SPONGE LAP 18X18 X RAY DECT (DISPOSABLE) ×6 IMPLANT
STAPLER VISISTAT 35W (STAPLE) ×2 IMPLANT
SUCTION POOLE HANDLE (INSTRUMENTS)
SUT PDS AB 1 CTX 36 (SUTURE) IMPLANT
SUT PDS AB 1 TP1 96 (SUTURE) ×2 IMPLANT
SUT SILK 2 0 (SUTURE) ×2
SUT SILK 2 0 SH CR/8 (SUTURE) ×2 IMPLANT
SUT SILK 2-0 18XBRD TIE 12 (SUTURE) ×1 IMPLANT
SUT SILK 3 0 (SUTURE) ×2
SUT SILK 3 0 SH CR/8 (SUTURE) ×2 IMPLANT
SUT SILK 3-0 18XBRD TIE 12 (SUTURE) ×1 IMPLANT
SUT VIC AB 3-0 SH 18 (SUTURE) IMPLANT
SUT VICRYL 2 0 18  UND BR (SUTURE)
SUT VICRYL 2 0 18 UND BR (SUTURE) IMPLANT
TOWEL OR 17X26 10 PK STRL BLUE (TOWEL DISPOSABLE) ×2 IMPLANT
TOWEL OR NON WOVEN STRL DISP B (DISPOSABLE) ×2 IMPLANT
TRAY FOLEY W/METER SILVER 16FR (SET/KITS/TRAYS/PACK) ×2 IMPLANT

## 2017-05-10 NOTE — Progress Notes (Signed)
ANTICOAGULATION CONSULT NOTE - Initial Consult  Pharmacy Consult for heparin Indication: atrial fibrillation  Allergies  Allergen Reactions  . Codeine Phosphate Swelling  . Nsaids Other (See Comments)    ulcers    Patient Measurements: Height: 5\' 6"  (167.6 cm) Weight: 215 lb (97.5 kg) IBW/kg (Calculated) : 63.8 Heparin Dosing Weight: 85.1kg  Vital Signs: Temp: 97.8 F (36.6 C) (06/29 0846) Temp Source: Axillary (06/29 0846) BP: 99/62 (06/29 1228) Pulse Rate: 110 (06/29 1228)  Labs:  Recent Labs  05/10/17 0912 05/10/17 0920 05/10/17 1008 05/10/17 1243  HGB 17.8*  --  16.7 16.0  HCT 52.3*  --  49.0 47.0  PLT 347  --   --   --   APTT  --  31  --   --   LABPROT  --  14.3  --   --   INR  --  1.11  --   --   CREATININE 1.88*  --  1.40* 1.50*  TROPONINI  --  <0.03  --   --     Estimated Creatinine Clearance: 49.4 mL/min (A) (by C-G formula based on SCr of 1.5 mg/dL (H)).   Medical History: Past Medical History:  Diagnosis Date  . Allergic rhinitis   . Anemia, iron deficiency   . Atrial arrhythmia   . Atrial fib/flutter, transient    following prior surgeries x 2  . Barrett's esophagus without dysplasia   . BPH (benign prostatic hyperplasia)   . Chronic LBP   . DDD (degenerative disc disease)   . Diverticulosis of colon   . Duodenal stricture   . Gastric outlet obstruction   . GERD (gastroesophageal reflux disease)   . Hemorrhoids   . Hyperlipidemia   . Hyperplastic colonic polyp 01/2000  . Hypertension   . Nephrolithiasis    hx of B  . Peptic ulcer disease with hemorrhage 08/2008   and GOO H. Pylori Ab negative  . Renal cyst   . Tubular adenoma of colon 03/2012      Assessment: 71 yo male comes in with cc of abd pain and emesis.  Pt has hx of PAF, NOT on coumadin, HTN, HL, PUD.  Pharmacy consulted to dose heparin for atrial fibrillation.  05/10/2017  APTT WNL INR WNL CBC WNL  Goal of Therapy:  Heparin level 0.3-0.7 units/ml Monitor  platelets by anticoagulation protocol   Plan:  heparin 4000 units x 1 then start Heparin drip 1200 units/hr Check heparin level in 8 hours Daily CBC  Dolly Rias RPh 05/10/2017, 1:14 PM Pager [phone]

## 2017-05-10 NOTE — Consult Note (Signed)
[facility] Surgery Consult Note  Cody Frazier [facility] [birth year]  [id number].    Requesting MD: Kathrynn Humble Chief Complaint/Reason for Consult: abdominal pain  HPI:  Patient with onset of  Right sided abdominal pain, n/v, and associated SOB since Wednesday night. Ate some food that had been sitting out for a few hours. Since onset, pain has progressively worsened. Describes pain as constant, sharp, and not relieved by anything. Patient denies bloody or coffee ground emesis. Last BM 2 days ago was normal, no bright blood or melena. Patient denies fevers/chills, chest pain, urinary sxs. Surgical history significant for appendectomy, a bilroth procedure in 2011 by Dr. Johney Maine, and hiatal hernia repair. PMH significant for PUD with hx of bleeding ulcers, barrett's esophagus, diverticulosis, A.fib/flutter post operatively, HTN. Patient underwent endoscopy/colonoscopy a few weeks ago and was told he had gastritis at that time.    Patient does not take any blood thinning medications.   Lactic acid 3.6 from 7.04, trending down. BP soft, tachycardic. Afeb.   ROS: Review of Systems  Constitutional: Positive for malaise/fatigue. Negative for chills and fever.  Respiratory: Positive for shortness of breath. Negative for wheezing.   Cardiovascular: Negative for chest pain, palpitations and leg swelling.  Gastrointestinal: Positive for abdominal pain (Right sided ), nausea and vomiting. Negative for blood in stool, constipation, diarrhea and melena.  Genitourinary: Negative for dysuria, frequency and urgency.  Musculoskeletal: Positive for back pain.  All other systems reviewed and are negative.   Family History  Problem Relation Age of Onset  . Cancer Mother        colon  . Colon polyps Mother   . Colon cancer Mother 28  . Cancer Father        colon and prostate cancer  . Colon polyps Father   . Colon cancer Father 39  . Cancer Paternal Uncle        colon  . Colon cancer Paternal Uncle 43  .  Colon cancer Paternal Uncle 60  . Hypertension Other   . Cancer Cousin        colon  . Stomach cancer Neg Hx     Past Medical History:  Diagnosis Date  . Allergic rhinitis   . Anemia, iron deficiency   . Atrial arrhythmia   . Atrial fib/flutter, transient    following prior surgeries x 2  . Barrett's esophagus without dysplasia   . BPH (benign prostatic hyperplasia)   . Chronic LBP   . DDD (degenerative disc disease)   . Diverticulosis of colon   . Duodenal stricture   . Gastric outlet obstruction   . GERD (gastroesophageal reflux disease)   . Hemorrhoids   . Hyperlipidemia   . Hyperplastic colonic polyp 01/2000  . Hypertension   . Nephrolithiasis    hx of B  . Peptic ulcer disease with hemorrhage 08/2008   and GOO H. Pylori Ab negative  . Renal cyst   . Tubular adenoma of colon 03/2012    Past Surgical History:  Procedure Laterality Date  . APPENDECTOMY    . BACK SURGERY  02/2003   L5  . BILROTH I PROCEDURE  2011   Dr Johney Maine  . HIATAL HERNIA REPAIR    . LUMBAR FUSION  2009   Current Facility-Administered Medications for the 05/10/17 encounter [facility] Encounter)  Medication  . 0.9 %  sodium chloride infusion   Current Meds  Medication Sig  . acetaminophen (TYLENOL) 500 MG tablet Take 1,000 mg by mouth every 6 (six) hours as needed  for mild pain or moderate pain.  Marland Kitchen Alum Hydroxide-Mag Trisilicate (GAVISCON) 85-46.2 MG CHEW Chew by mouth as needed.    Marland Kitchen atorvastatin (LIPITOR) 20 MG tablet TAKE 1 TABLET BY MOUTH EVERY DAY  . busPIRone (BUSPAR) 7.5 MG tablet TAKE 1 - 2 TABLETS DAILY AS DIRECTED FOR ANXIETY.  . cetirizine (ZYRTEC) 10 MG tablet Take 10 mg by mouth daily.  . clotrimazole-betamethasone (LOTRISONE) cream Apply topically 2 (two) times daily.  . ferrous sulfate 325 (65 FE) MG tablet Take 325 mg by mouth 2 (two) times daily.    . fluticasone (FLONASE) 50 MCG/ACT nasal spray Place 2 sprays into both nostrils daily.  Marland Kitchen latanoprost (XALATAN) 0.005 %  ophthalmic solution Place 1 drop into both eyes at bedtime.  . metoCLOPramide (REGLAN) 5 MG tablet TAKE 1 TABLET (5 MG TOTAL) BY MOUTH EVERY 8 (EIGHT) HOURS AS NEEDED FOR NAUSEA.  . Multiple Vitamins-Minerals (CENTRUM SILVER PO) Take by mouth daily.    . ondansetron (ZOFRAN) 4 MG tablet TAKE 1 TABLET (4 MG TOTAL) BY MOUTH EVERY 8 (EIGHT) HOURS AS NEEDED FOR NAUSEA OR VOMITING.  . pantoprazole (PROTONIX) 40 MG tablet Take 1 tablet (40 mg total) by mouth 2 (two) times daily.  . valsartan (DIOVAN) 320 MG tablet Take 1 tablet (320 mg total) by mouth daily.    Social History:  reports that he has quit smoking. He has never used smokeless tobacco. He reports that he does not drink alcohol or use drugs.  Allergies:  Allergies  Allergen Reactions  . Codeine Phosphate Swelling  . Nsaids Other (See Comments)    ulcers     (Not in a hospital admission)  Blood pressure 99/62, pulse (!) 110, temperature 97.8 F (36.6 C), temperature source Axillary, resp. rate 17, height _0  (1.676 m), weight 97.5 kg (215 lb), SpO2 100 %. Physical Exam: Physical Exam  Constitutional: He is oriented to person, place, and time. He appears well-developed and well-nourished. He is cooperative.  Non-toxic appearance. No distress.  HENT:  Head: Normocephalic and atraumatic.  Right Ear: External ear normal.  Left Ear: External ear normal.  Nose: Nose normal.  Mouth/Throat: Mucous membranes are not pale, dry and not cyanotic.  Eyes: Conjunctivae and lids are normal. Pupils are equal, round, and reactive to light. No scleral icterus.  Neck: Trachea normal and normal range of motion. Neck supple.  Cardiovascular: Regular rhythm, S1 normal and S2 normal.  Tachycardia present.   Pulses:      Radial pulses are 2+ on the right side, and 2+ on the left side.       Dorsalis pedis pulses are 2+ on the right side, and 2+ on the left side.  Pulmonary/Chest: Effort normal and breath sounds normal. He has no decreased breath  sounds. He has no wheezes. He has no rhonchi. He has no rales.  Abdominal: He exhibits distension (moderate). He exhibits no shifting dullness. Bowel sounds are decreased. There is tenderness in the right upper quadrant and right lower quadrant. There is no rigidity, no rebound and no guarding. No hernia.  Musculoskeletal:  Hx of back surgery. Moving all limbs, no gross deformities or edema.   Neurological: He is alert and oriented to person, place, and time. He has normal strength. No sensory deficit.  Skin: Skin is warm, dry and intact.  Psychiatric: He has a normal mood and affect. His behavior is normal. Thought content normal.    Results for orders placed or performed during the hospital encounter of 05/10/17 (  from the past 48 hour(s))  Lipase, blood     Status: None   Collection Time: 05/10/17  9:12 AM  Result Value Ref Range   Lipase 19 11 - 51 U/L  Comprehensive metabolic panel     Status: Abnormal   Collection Time: 05/10/17  9:12 AM  Result Value Ref Range   Sodium 140 135 - 145 mmol/L    Comment: REPEATED TO VERIFY   Potassium 3.6 3.5 - 5.1 mmol/L   Chloride 102 101 - 111 mmol/L    Comment: REPEATED TO VERIFY   CO2 14 (L) 22 - 32 mmol/L    Comment: REPEATED TO VERIFY   Glucose, Bld 380 (H) 65 - 99 mg/dL   BUN 17 6 - 20 mg/dL   Creatinine, Ser 1.88 (H) 0.61 - 1.24 mg/dL   Calcium 10.4 (H) 8.9 - 10.3 mg/dL   Total Protein 7.9 6.5 - 8.1 g/dL   Albumin 4.2 3.5 - 5.0 g/dL   AST 47 (H) 15 - 41 U/L   ALT 21 17 - 63 U/L    Comment: RESULTS CONFIRMED BY MANUAL DILUTION   Alkaline Phosphatase 113 38 - 126 U/L   Total Bilirubin 0.6 0.3 - 1.2 mg/dL   GFR calc non Af Amer 34 (L) >60 mL/min   GFR calc Af Amer 40 (L) >60 mL/min    Comment: (NOTE) The eGFR has been calculated using the CKD EPI equation. This calculation has not been validated in all clinical situations. eGFR's persistently <60 mL/min signify possible Chronic Kidney Disease.    Anion gap 24 (H) 5 - 15     Comment: REPEATED TO VERIFY  CBC     Status: Abnormal   Collection Time: 05/10/17  9:12 AM  Result Value Ref Range   WBC 24.2 (H) 4.0 - 10.5 K/uL   RBC 5.93 (H) 4.22 - 5.81 MIL/uL   Hemoglobin 17.8 (H) 13.0 - 17.0 g/dL   HCT 52.3 (H) 39.0 - 52.0 %   MCV 88.2 78.0 - 100.0 fL   MCH 30.0 26.0 - 34.0 pg   MCHC 34.0 30.0 - 36.0 g/dL   RDW 15.5 11.5 - 15.5 %   Platelets 347 150 - 400 K/uL  Differential     Status: Abnormal   Collection Time: 05/10/17  9:12 AM  Result Value Ref Range   Neutrophils Relative % 89 %   Neutro Abs 21.7 (H) 1.7 - 7.7 K/uL   Lymphocytes Relative 6 %   Lymphs Abs 1.5 0.7 - 4.0 K/uL   Monocytes Relative 5 %   Monocytes Absolute 1.2 (H) 0.1 - 1.0 K/uL   Eosinophils Relative 0 %   Eosinophils Absolute 0.0 0.0 - 0.7 K/uL   Basophils Relative 0 %   Basophils Absolute 0.0 0.0 - 0.1 K/uL  TSH     Status: None   Collection Time: 05/10/17  9:20 AM  Result Value Ref Range   TSH 1.401 0.350 - 4.500 uIU/mL    Comment: Performed by a 3rd Generation assay with a functional sensitivity of <=0.01 uIU/mL.  Troponin I     Status: None   Collection Time: 05/10/17  9:20 AM  Result Value Ref Range   Troponin I <0.03 <0.03 ng/mL  APTT  (IF patient is taking Pradaxa)     Status: None   Collection Time: 05/10/17  9:20 AM  Result Value Ref Range   aPTT 31 24 - 36 seconds  Protime-INR (if pt is taking coumadin)  Status: None   Collection Time: 05/10/17  9:20 AM  Result Value Ref Range   Prothrombin Time 14.3 11.4 - 15.2 seconds   INR 1.11   Magnesium     Status: None   Collection Time: 05/10/17  9:50 AM  Result Value Ref Range   Magnesium 2.3 1.7 - 2.4 mg/dL  I-Stat CG4 Lactic Acid, ED     Status: Abnormal   Collection Time: 05/10/17 10:00 AM  Result Value Ref Range   Lactic Acid, Venous 7.04 (HH) 0.5 - 1.9 mmol/L   Comment NOTIFIED PHYSICIAN   I-stat chem 8, ed     Status: Abnormal   Collection Time: 05/10/17 10:08 AM  Result Value Ref Range   Sodium 140 135 - 145  mmol/L   Potassium 3.7 3.5 - 5.1 mmol/L   Chloride 111 101 - 111 mmol/L   BUN 21 (H) 6 - 20 mg/dL   Creatinine, Ser 1.40 (H) 0.61 - 1.24 mg/dL   Glucose, Bld 316 (H) 65 - 99 mg/dL   Calcium, Ion 1.06 (L) 1.15 - 1.40 mmol/L   TCO2 13 0 - 100 mmol/L   Hemoglobin 16.7 13.0 - 17.0 g/dL   HCT 49.0 39.0 - 52.0 %   Ct Abdomen Pelvis W Contrast  Result Date: 05/10/2017 CLINICAL DATA:  Generalized abdominal pain EXAM: CT ABDOMEN AND PELVIS WITH CONTRAST TECHNIQUE: Multidetector CT imaging of the abdomen and pelvis was performed using the standard protocol following bolus administration of intravenous contrast. CONTRAST:  36m ISOVUE-300 IOPAMIDOL (ISOVUE-300) INJECTION 61% COMPARISON:  07/22/2012, 06/29/2009 FINDINGS: Lower chest: Mild scarring is noted in the right lung base. A subpleural nodule is noted on the left best seen on image number 37 of series 7 stable from prior exams dating back to 2010. Hepatobiliary: The liver is within normal limits. The gallbladder is well distended but stable. No cholelithiasis is seen. Pancreas: Unremarkable. No pancreatic ductal dilatation or surrounding inflammatory changes. Spleen: Normal in size without focal abnormality. Adrenals/Urinary Tract: Adrenal glands are within normal limits. Scattered bilateral renal calculi are again seen as are multiple renal cysts. Some slight increase in the bulk of the renal stones is noted. The collecting systems and ureters are within normal limits. The bladder is decompressed. Stomach/Bowel: Scattered diverticular change of the colon is noted. No evidence of diverticulitis is seen. Mild inflammatory changes are noted within the small bowel in the right mid abdomen. Some associated ascites is noted. Venous engorgement is seen. Best visualized on the coronal imaging there are changes consistent with an internal hernia with the be inflamed loops of small bowel within the hernia. No definitive incarceration is noted although there is some  mild dilatation of the more proximal small bowel with a transition zone at the entrance to the hernia. Prior postsurgical changes of the stomach are noted. Vascular/Lymphatic: Aortic atherosclerosis. No enlarged abdominal or pelvic lymph nodes. Reproductive: Prostate is unremarkable. Musculoskeletal: Postsurgical changes are noted in the lower lumbar spine. No acute bony abnormality is noted. IMPRESSION: 1. Changes of internal hernia within the right mid abdomen with multiple loops of small bowel within. Some venous engorgement is identified although no obstruction of the herniated loops of bowel is noted. There is some more proximal dilatation of the small bowel secondary to the transition zone from the hernia. 2. Mild ascites.  This is likely reactive in nature 3. Chronic changes as described above. Electronically Signed   By: MInez CatalinaM.D.   On: 05/10/2017 11:30   [facility]  Result Date: 05/10/2017 CLINICAL DATA:  Rule out pneumonia.  Tachycardia, short of breath EXAM: PORTABLE CHEST 1 VIEW COMPARISON:  08/26/2015 FINDINGS: Elevated right hemidiaphragm with right lower lobe atelectasis. Left lung clear. Negative for heart failure IMPRESSION: Right lower lobe atelectasis. Electronically Signed   By: Franchot Gallo M.D.   On: 05/10/2017 11:40      Assessment/Plan Abdominal pain and emesis - CT - Changes of internal hernia within the right mid abdomen with multiple loops of small bowel within. Some venous engorgement is identified although no obstruction of the herniated loops of bowel is noted. There is some more proximal dilatation of the small bowel secondary to the transition zone from the hernia. - NPO, IVF - MD to see and evaluate further  Brigid Re, [facility] Surgery 05/10/2017, 12:48 PM Pager: [phone] Consults: [phone] Mon-Fri 7:00 am-4:30 pm Sat-Sun 7:00 am-11:30 am

## 2017-05-10 NOTE — Progress Notes (Signed)
Pharmacy Antibiotic Note  Cody Frazier is a 71 y.o. male admitted on 05/10/2017 with intra-abdominal infection.  Pt presentts with generalized abd pain with n/v, denies diarrhea, onset yesterday. Pharmacy has been consulted for zosyn dosing.  Plan: Zosyn 3.375mg  IV x 1 over 30 minutes in ED then Zosyn 3.375g IV Q8H infused over 4hrs. Pharmacy will sign off and follow peripherally  Height: 5\' 6"  (167.6 cm) Weight: 215 lb (97.5 kg) IBW/kg (Calculated) : 63.8  Temp (24hrs), Avg:97.8 F (36.6 C), Min:97.8 F (36.6 C), Max:97.8 F (36.6 C)   Recent Labs Lab 05/10/17 0912 05/10/17 1000 05/10/17 1008  WBC 24.2*  --   --   CREATININE 1.88*  --  1.40*  LATICACIDVEN  --  7.04*  --     Estimated Creatinine Clearance: 52.9 mL/min (A) (by C-G formula based on SCr of 1.4 mg/dL (H)).    Allergies  Allergen Reactions  . Codeine Phosphate Swelling  . Nsaids Other (See Comments)    ulcers    Antimicrobials this admission: 6/29 zosyn >>   Microbiology results: 6/29 BCx:    Thank you for allowing pharmacy to be a part of this patient's care.  Dolly Rias RPh 05/10/2017, 11:05 AM Pager [phone]

## 2017-05-10 NOTE — ED Notes (Signed)
Urinal is sitting at bedside. Pt has been resting.

## 2017-05-10 NOTE — Interval H&P Note (Signed)
History and Physical Interval Note:  05/10/2017 7:27 PM  Cody Frazier  has presented today for surgery, with the diagnosis of exploratory laparotomy  The various methods of treatment have been discussed with the patient and family. After consideration of risks, benefits and other options for treatment, the patient has consented to  Procedure(s): EXPLORATORY LAPAROTOMY (N/A) as a surgical intervention .  The patient's history has been reviewed, patient examined, no change in status, stable for surgery.  I have reviewed the patient's chart and labs.  Questions were answered to the patient's satisfaction.     Offie Waide B

## 2017-05-10 NOTE — ED Notes (Addendum)
Wife called staff into room; witnessed seizure with associated diaphoresis, incontinence, and tremor. Unable to collect oxygen and pulse on pulse ox related to cold/calmy fingers. Pt transferred to bed. Regenia Skeeter, MD and Kathrynn Humble, MD in provider room made aware of pt status, complaint, and new onset seizure activity with denied hx of same.

## 2017-05-10 NOTE — Anesthesia Procedure Notes (Signed)
Procedure Name: Intubation Date/Time: 05/10/2017 8:18 PM Performed by: Claudia Desanctis Pre-anesthesia Checklist: Patient identified, Emergency Drugs available, Suction available and Patient being monitored Patient Re-evaluated:Patient Re-evaluated prior to inductionOxygen Delivery Method: Circle system utilized Preoxygenation: Pre-oxygenation with 100% oxygen Intubation Type: IV induction, Cricoid Pressure applied and Rapid sequence Laryngoscope Size: 2 and Miller Grade View: Grade I Tube type: Oral Tube size: 7.5 mm Number of attempts: 1 Airway Equipment and Method: Stylet Placement Confirmation: ETT inserted through vocal cords under direct vision,  positive ETCO2 and breath sounds checked- equal and bilateral Secured at: 22 cm Tube secured with: Tape Dental Injury: Teeth and Oropharynx as per pre-operative assessment

## 2017-05-10 NOTE — Anesthesia Postprocedure Evaluation (Signed)
Anesthesia Post Note  Patient: Cody Frazier  Procedure(s) Performed: Procedure(s) (LRB): EXPLORATORY LAPAROTOMY WITH ENTEROLYSIS (N/A)     Patient location during evaluation: PACU Anesthesia Type: General Level of consciousness: awake and alert Pain management: pain level controlled Vital Signs Assessment: post-procedure vital signs reviewed and stable Respiratory status: spontaneous breathing, nonlabored ventilation, respiratory function stable and patient connected to nasal cannula oxygen Cardiovascular status: blood pressure returned to baseline and stable Postop Assessment: no signs of nausea or vomiting Anesthetic complications: no    Last Vitals:  Vitals:   05/10/17 2245 05/10/17 2300  BP: 118/78 129/79  Pulse: (!) 110 (!) 102  Resp: (!) 25 (!) 22  Temp: 37.1 C 36.9 C    Last Pain:  Vitals:   05/10/17 2245  TempSrc:   PainSc: 3                  Tiajuana Amass

## 2017-05-10 NOTE — ED Provider Notes (Addendum)
[facility] DEPT Provider Note   CSN: [id number] Arrival date & time: 05/10/17  0840     History   Chief Complaint Chief Complaint  Patient presents with  . Abdominal Pain  . Emesis    HPI Cody Frazier is a 71 y.o. male.  HPI  Pt comes in with cc of abd pain, emesis. Pt has hx of PAF, not on coumadin, HTN, HL, PUD which has required surgery and bowel resection. Per patient he started getting sick Wed night. He ate food that was out of 3 hours, and in the middle of the night started having abd pain, nausea and emesis. Symptoms have continued since then, and he has had 2 episodes of light brown emesis today. There is no blood that he has seen in the stools or vomitus. Pt is passing flatus, but he hasnt had a BM in 2 days.    Past Medical History:  Diagnosis Date  . Allergic rhinitis   . Anemia, iron deficiency   . Atrial arrhythmia   . Atrial fib/flutter, transient    following prior surgeries x 2  . Barrett's esophagus without dysplasia   . BPH (benign prostatic hyperplasia)   . Chronic LBP   . DDD (degenerative disc disease)   . Diverticulosis of colon   . Duodenal stricture   . Gastric outlet obstruction   . GERD (gastroesophageal reflux disease)   . Hemorrhoids   . Hyperlipidemia   . Hyperplastic colonic polyp 01/2000  . Hypertension   . Nephrolithiasis    hx of B  . Peptic ulcer disease with hemorrhage 08/2008   and GOO H. Pylori Ab negative  . Renal cyst   . Tubular adenoma of colon 03/2012    Patient Active Problem List   Diagnosis Date Noted  . Snoring 02/06/2017  . Discoloration of nailbeds 06/12/2016  . DOE (dyspnea on exertion) 06/12/2016  . Wart of face 05/26/2015  . Eye infection 05/20/2015  . Motion sickness 05/24/2014  . Eczema 02/22/2014  . Pneumonia, organism unspecified(486) 11/20/2013  . Neoplasm of uncertain behavior of skin 03/01/2013  . Acute sinus infection 12/11/2012  . Kidney stone 08/19/2012  . Rash 08/13/2011  .  Hypogonadism male 05/27/2011  . Well adult exam 02/09/2011  . Cerumen impaction 02/09/2011  . Actinic keratoses 02/09/2011  . Observation for suspected malignant neoplasm 02/01/2011  . GASTROPARESIS 12/21/2010  . Anxiety state 10/20/2010  . BARRETTS ESOPHAGUS 01/14/2009  . OTHER OBSTRUCTION OF DUODENUM 01/14/2009  . ACUTE GASTRIC ULCER W/HEMORRHAGE AND OBSTRUCTION 08/30/2008  . Fatigue 08/26/2008  . GASTROENTERITIS 08/12/2008  . G I BLEED 08/12/2008  . COUGH 08/12/2008  . Chest pain 08/12/2008  . ABDOMINAL PAIN, EPIGASTRIC 08/12/2008  . URINARY TRACT INFECTION (UTI) 05/05/2008  . ALLERGIC RHINITIS 10/22/2007  . ABDOMINAL PAIN, UNSPECIFIED SITE 10/15/2007  . GERD 09/11/2007  . BENIGN PROSTATIC HYPERTROPHY 09/11/2007  . NEPHROLITHIASIS, HX OF 09/11/2007  . Dyslipidemia 06/09/2007  . Iron deficiency anemia 06/09/2007  . Essential hypertension 06/09/2007  . DIVERTICULOSIS, COLON 06/09/2007  . LOW BACK PAIN 06/09/2007  . COLONIC POLYPS, HX OF 06/09/2007    Past Surgical History:  Procedure Laterality Date  . APPENDECTOMY    . BACK SURGERY  02/2003   L5  . BILROTH I PROCEDURE  2011   Dr Johney Maine  . HIATAL HERNIA REPAIR    . LUMBAR FUSION  2009       Home Medications    Prior to Admission medications   Medication Sig Start Date  End Date Taking? Authorizing Provider  acetaminophen (TYLENOL) 500 MG tablet Take 1,000 mg by mouth every 6 (six) hours as needed for mild pain or moderate pain.   Yes [provider]  Alum Hydroxide-Mag Trisilicate (GAVISCON) 93-23.5 MG CHEW Chew by mouth as needed.     Yes [provider]  atorvastatin (LIPITOR) 20 MG tablet TAKE 1 TABLET BY MOUTH EVERY DAY 07/18/16  Yes Plotnikov, Evie Lacks, MD  busPIRone (BUSPAR) 7.5 MG tablet TAKE 1 - 2 TABLETS DAILY AS DIRECTED FOR ANXIETY. 03/26/17  Yes Plotnikov, Evie Lacks, MD  cetirizine (ZYRTEC) 10 MG tablet Take 10 mg by mouth daily.   Yes [provider]  clotrimazole-betamethasone  (LOTRISONE) cream Apply topically 2 (two) times daily. 08/22/16  Yes Plotnikov, Evie Lacks, MD  ferrous sulfate 325 (65 FE) MG tablet Take 325 mg by mouth 2 (two) times daily.     Yes [provider]  fluticasone (FLONASE) 50 MCG/ACT nasal spray Place 2 sprays into both nostrils daily. 02/22/17  Yes Plotnikov, Evie Lacks, MD  latanoprost (XALATAN) 0.005 % ophthalmic solution Place 1 drop into both eyes at bedtime. 03/16/17  Yes [provider]  metoCLOPramide (REGLAN) 5 MG tablet TAKE 1 TABLET (5 MG TOTAL) BY MOUTH EVERY 8 (EIGHT) HOURS AS NEEDED FOR NAUSEA. 03/22/17  Yes Plotnikov, Evie Lacks, MD  Multiple Vitamins-Minerals (CENTRUM SILVER PO) Take by mouth daily.     Yes [provider]  ondansetron (ZOFRAN) 4 MG tablet TAKE 1 TABLET (4 MG TOTAL) BY MOUTH EVERY 8 (EIGHT) HOURS AS NEEDED FOR NAUSEA OR VOMITING. 11/18/15  Yes Plotnikov, Evie Lacks, MD  pantoprazole (PROTONIX) 40 MG tablet Take 1 tablet (40 mg total) by mouth 2 (two) times daily. 04/10/17  Yes Ladene Artist, MD  valsartan (DIOVAN) 320 MG tablet Take 1 tablet (320 mg total) by mouth daily. 02/06/17  Yes Plotnikov, Evie Lacks, MD  oxyCODONE (ROXICODONE) 15 MG immediate release tablet Take 1 tablet (15 mg total) by mouth 4 (four) times daily as needed for pain. For pain - Fill on or after 04/25/17 Please fill every 30 days Patient not taking: Reported on 05/10/2017 02/06/17   Plotnikov, Evie Lacks, MD  triamcinolone cream (KENALOG) 0.5 % Apply 1 application topically 3 (three) times daily. Patient not taking: Reported on 05/10/2017 11/22/16   Plotnikov, Evie Lacks, MD    Family History Family History  Problem Relation Age of Onset  . Cancer Mother        colon  . Colon polyps Mother   . Colon cancer Mother 29  . Cancer Father        colon and prostate cancer  . Colon polyps Father   . Colon cancer Father 12  . Cancer Paternal Uncle        colon  . Colon cancer Paternal Uncle 65  . Colon cancer Paternal Uncle 26    . Hypertension Other   . Cancer Cousin        colon  . Stomach cancer Neg Hx     Social History Social History  Substance Use Topics  . Smoking status: Former Research scientist (life sciences)  . Smokeless tobacco: Never Used     Comment: as a teenager  . Alcohol use No     Allergies   Codeine phosphate and Nsaids   Review of Systems Review of Systems  All other systems reviewed and are negative.    Physical Exam Updated Vital Signs BP 109/89 (BP Location: Right Arm)   Pulse Marland Kitchen)  104   Temp 97.8 F (36.6 C) (Axillary)   Resp 16   Ht 5\' 6"  (1.676 m)   Wt 97.5 kg (215 lb)   SpO2 98%   BMI 34.70 kg/m   Physical Exam  Constitutional: He is oriented to person, place, and time. He appears well-developed.  HENT:  Head: Normocephalic and atraumatic.  Eyes: Conjunctivae and EOM are normal. Pupils are equal, round, and reactive to light.  Neck: Normal range of motion. Neck supple.  Cardiovascular: Regular rhythm.   tachycardia  Pulmonary/Chest: Effort normal and breath sounds normal.  Abdominal: Soft. Bowel sounds are normal. He exhibits no distension and no mass. There is tenderness. There is guarding. There is no rebound.  R sided tenderness  Musculoskeletal: He exhibits no deformity.  Neurological: He is alert and oriented to person, place, and time.  Skin: Skin is warm.  Nursing note and vitals reviewed.    ED Treatments / Results  Labs (all labs ordered are listed, but only abnormal results are displayed) Labs Reviewed  COMPREHENSIVE METABOLIC PANEL - Abnormal; Notable for the following:       Result Value   CO2 14 (*)    Glucose, Bld 380 (*)    Creatinine, Ser 1.88 (*)    Calcium 10.4 (*)    AST 47 (*)    GFR calc non Af Amer 34 (*)    GFR calc Af Amer 40 (*)    Anion gap 24 (*)    All other components within normal limits  CBC - Abnormal; Notable for the following:    WBC 24.2 (*)    RBC 5.93 (*)    Hemoglobin 17.8 (*)    HCT 52.3 (*)    All other components within  normal limits  BLOOD GAS, VENOUS - Abnormal; Notable for the following:    pCO2, Ven 31.4 (*)    pO2, Ven 62.0 (*)    Bicarbonate 19.4 (*)    Acid-base deficit 3.6 (*)    All other components within normal limits  DIFFERENTIAL - Abnormal; Notable for the following:    Neutro Abs 21.7 (*)    Monocytes Absolute 1.2 (*)    All other components within normal limits  I-STAT CG4 LACTIC ACID, ED - Abnormal; Notable for the following:    Lactic Acid, Venous 7.04 (*)    All other components within normal limits  I-STAT CHEM 8, ED - Abnormal; Notable for the following:    BUN 21 (*)    Creatinine, Ser 1.40 (*)    Glucose, Bld 316 (*)    Calcium, Ion 1.06 (*)    All other components within normal limits  I-STAT CG4 LACTIC ACID, ED - Abnormal; Notable for the following:    Lactic Acid, Venous 3.60 (*)    All other components within normal limits  I-STAT CHEM 8, ED - Abnormal; Notable for the following:    BUN 22 (*)    Creatinine, Ser 1.50 (*)    Glucose, Bld 144 (*)    All other components within normal limits  CULTURE, BLOOD (ROUTINE X 2)  CULTURE, BLOOD (ROUTINE X 2)  LIPASE, BLOOD  MAGNESIUM  TSH  TROPONIN I  APTT  PROTIME-INR  URINALYSIS, ROUTINE W REFLEX MICROSCOPIC  HEPARIN LEVEL (UNFRACTIONATED)  CBC    EKG  EKG Interpretation  Date/Time:  Friday May 10 2017 10:08:43 EDT Ventricular Rate:  187 PR Interval:    QRS Duration: 92 QT Interval:  287 QTC Calculation: 507 R Axis:  31 Text Interpretation:  Atrial fibrillation with rapid V-rate Nonspecific ST and T wave abnormality had aflutter in 2010 Confirmed by Varney Biles [phone]) on 05/10/2017 10:47:14 AM       Radiology Ct Abdomen Pelvis W Contrast  Result Date: 05/10/2017 CLINICAL DATA:  Generalized abdominal pain EXAM: CT ABDOMEN AND PELVIS WITH CONTRAST TECHNIQUE: Multidetector CT imaging of the abdomen and pelvis was performed using the standard protocol following bolus administration of intravenous  contrast. CONTRAST:  29mL ISOVUE-300 IOPAMIDOL (ISOVUE-300) INJECTION 61% COMPARISON:  07/22/2012, 06/29/2009 FINDINGS: Lower chest: Mild scarring is noted in the right lung base. A subpleural nodule is noted on the left best seen on image number 37 of series 7 stable from prior exams dating back to 2010. Hepatobiliary: The liver is within normal limits. The gallbladder is well distended but stable. No cholelithiasis is seen. Pancreas: Unremarkable. No pancreatic ductal dilatation or surrounding inflammatory changes. Spleen: Normal in size without focal abnormality. Adrenals/Urinary Tract: Adrenal glands are within normal limits. Scattered bilateral renal calculi are again seen as are multiple renal cysts. Some slight increase in the bulk of the renal stones is noted. The collecting systems and ureters are within normal limits. The bladder is decompressed. Stomach/Bowel: Scattered diverticular change of the colon is noted. No evidence of diverticulitis is seen. Mild inflammatory changes are noted within the small bowel in the right mid abdomen. Some associated ascites is noted. Venous engorgement is seen. Best visualized on the coronal imaging there are changes consistent with an internal hernia with the be inflamed loops of small bowel within the hernia. No definitive incarceration is noted although there is some mild dilatation of the more proximal small bowel with a transition zone at the entrance to the hernia. Prior postsurgical changes of the stomach are noted. Vascular/Lymphatic: Aortic atherosclerosis. No enlarged abdominal or pelvic lymph nodes. Reproductive: Prostate is unremarkable. Musculoskeletal: Postsurgical changes are noted in the lower lumbar spine. No acute bony abnormality is noted. IMPRESSION: 1. Changes of internal hernia within the right mid abdomen with multiple loops of small bowel within. Some venous engorgement is identified although no obstruction of the herniated loops of bowel is noted.  There is some more proximal dilatation of the small bowel secondary to the transition zone from the hernia. 2. Mild ascites.  This is likely reactive in nature 3. Chronic changes as described above. Electronically Signed   By: Inez Catalina M.D.   On: 05/10/2017 11:30   Dg Chest Port 1 View  Result Date: 05/10/2017 CLINICAL DATA:  Rule out pneumonia.  Tachycardia, short of breath EXAM: PORTABLE CHEST 1 VIEW COMPARISON:  08/26/2015 FINDINGS: Elevated right hemidiaphragm with right lower lobe atelectasis. Left lung clear. Negative for heart failure IMPRESSION: Right lower lobe atelectasis. Electronically Signed   By: Franchot Gallo M.D.   On: 05/10/2017 11:40    Procedures Procedures (including critical care time)  CRITICAL CARE Performed by: Varney Biles   Total critical care time: 85 minutes - multiple consults, multiple reassessments, shock patient  Critical care time was exclusive of separately billable procedures and treating other patients.  Critical care was necessary to treat or prevent imminent or life-threatening deterioration.  Critical care was time spent personally by me on the following activities: development of treatment plan with patient and/or surrogate as well as nursing, discussions with consultants, evaluation of patient's response to treatment, examination of patient, obtaining history from patient or surrogate, ordering and performing treatments and interventions, ordering and review of laboratory studies, ordering and review  of radiographic studies, pulse oximetry and re-evaluation of patient's condition.   Medications Ordered in ED Medications  piperacillin-tazobactam (ZOSYN) IVPB 3.375 g (not administered)  lactated ringers bolus 2,000 mL (0 mLs Intravenous Stopped 05/10/17 1325)  piperacillin-tazobactam (ZOSYN) IVPB 3.375 g (0 g Intravenous Stopped 05/10/17 1249)  lactated ringers bolus 1,000 mL (0 mLs Intravenous Stopped 05/10/17 1403)  iopamidol (ISOVUE-300) 61  % injection 100 mL (60 mLs Intravenous Contrast Given 05/10/17 1053)  famotidine (PEPCID) IVPB 20 mg premix (0 mg Intravenous Stopped 05/10/17 1325)  heparin bolus via infusion 4,000 Units (4,000 Units Intravenous Bolus from Bag 05/10/17 1332)     Initial Impression / Assessment and Plan / ED Course  I have reviewed the triage vital signs and the nursing notes.  Pertinent labs & imaging results that were available during my care of the patient were reviewed by me and considered in my medical decision making (see chart for details).  Clinical Course as of May 10 1404  Fri May 10, 2017  1000 Lactate elevated. I suspect the cause to be hypovolemia at this time Lactic Acid, Venous: (!!) 7.04 [AN]  1000 Elevated WC - infection is high on the list, but so is SBO and gastroenteritis. CT abd ordered. Holding antibiotics. No fevers at home and none now. WBC: (!) 24.2 [AN]  1000 I went to reassess the patient. He is noted to be tachycardic now-  120s. EKG ordered.  [AN]  1055 EKG shows AF with RVR. BP has dropped. Pad ordered, code sepsis activated. Called CT to take patient back STAT for CT abd. Repeat abd exam reveals persistent R sided tenderness. Abd is still soft. I considered mesenteric ischemia in the ddx. CT angio would reuqire almost double the iv contrast, and the pretest probability for ischemia is not high enough to take on the risk of increased contrast. 2 liters more LR ordered, to get to total of 4 liters LR. Zosyn ordered. RNs notified.  [AN]  1216 Surgery to see. No clear obstruction, but there is some venous engorgement and bowel edema. ? Partial sbo. CT ABDOMEN PELVIS W CONTRAST [AN]  1217 Surgery has seen patient. They recommend medicine admission, they will be on consult. The patient will be staffed and they will recommend on NG or not.  [AN]  1244 My suspicion for infection is low now.  Sepsis reassessment completed. Repeat labs pending. 2 liters ivf done. More fluids ordered for a  total of 4 liters.  [AN]  1405 Surgery wants the heparin to be discontinued  [AN]    Clinical Course User Index [AN] Varney Biles, MD   Pt comes in with cc of abd pain, nausea and emesis. Pt had a seizure like episode while in the [place] - per wife, pt's eyes rolled up, and he had a blank stare just for few seconds. No incontinence, no post ictal phase, there is no hx of seizures. Pt also was diaphoretic.  My concerns for primary seizures are low. Pt appears dehydrated, and he could have had a syncope seizure. He is noted to be tachycardic and has a normal neuro exam.  Pt is also having abd pain, nausea and has hx of abd surgery. I am concerned about possible SBO. Also possible is perforated viscus. His initial labs came in with WC > 22, lactate > 7. Antibiotics started. Pt went into AF with RVR and his BP also dropped. Aggressive fluid hydration started. Pads placed on his chest - no need for cardioversion immediately. Hx  of PAF.  CHA2DS2/VAS Stroke Risk Points      2 >= 2 Points: High Risk  1 - 1.99 Points: Medium Risk  0 Points: Low Risk    The patient's score has not changed in the past year.:  No Change         Details    Note: External data might be a factor in metrics not marked with    Points Metrics   This score determines the patient's risk of having a stroke if the  patient has atrial fibrillation.       0 Has Congestive Heart Failure:  No   0 Has Vascular Disease:  No   1 Has Hypertension:  Yes   1 Age:  81   0 Has Diabetes:  No   0 Had Stroke:  No Had TIA:  No Had thromboembolism:  No   0 Male:  No         Final Clinical Impressions(s) / ED Diagnoses   Final diagnoses:  Hypovolemic shock ([place])  Atrial fibrillation with RVR ([place])  Lactic acidosis    New Prescriptions New Prescriptions   No medications on file     Varney Biles, MD 05/10/17 [place], [place], MD 05/10/17 [place], [place], MD 05/10/17 1405

## 2017-05-10 NOTE — H&P (Addendum)
History and Physical    Cody Frazier MPN:[id number] DOB: 12-02-1945 DOA: 05/10/2017  Referring MD/NP/PA: Dr. Kathrynn Humble PCP: Cassandria Anger, MD  Patient coming from: Home  Chief Complaint: Abdominal pain  HPI: Cody Frazier is a 71 y.o. male with medical history significant of HTN, A. fib, PUD, GERD;  who presented with complaints of right-sided abdominal pain with nausea and vomiting Wednesday night(6/27). Symptoms started after he ate food that had been sitting out for over 3 hours. Patient was awoken out of sleep due to symptoms. Pain is constant and sharp. He did not try anything to relieve symptoms except for Tylenol which provided no relief. He's had multiple episodes of emesis which were noted to be nonbloody. He reports that he still able to pass flatus and has been belching a lot, but his last bowel movement was 2 days ago. Other associated symptoms include shortness of breath. Denies any fever, hematemesis, blood in stools question, chest pain, palpitations, or dysuria.   ED Course: While waiting in the waiting room patient had what was reported to be a seizure-like episode where his eyes rolled up and he had a blank stare and was diaphoretic for which he was taken back immediately for further evaluation. While in the ED the patient was seen to be afebrile, pulse 65-187, respirations 16-21, blood pressures were noted to be as low as 98/67, and O2 saturations maintained. On initial EKG patient was noted to be in A. fib with RVR 187 bpm, but heart rate improved with IV fluids. Labs revealed WBC 24.4, hemoglobin 17.8, CO2 14, BUN 17, creatinine 1.88, calcium 10.4, glucose 380.Sepsis protocol was initiated and the patient was placed on broad-spectrum antibiotics of Zosyn. CT scan of the abdomen showed signs of possible internal hernia. Gen. surgery was consulted. A heparin drip was initially ordered for the patient's history of atrial fibrillation, but this was discontinued for the  possible need of surgery.   Review of Systems: Review of Systems  Constitutional: Positive for malaise/fatigue. Negative for chills and fever.  HENT: Negative for ear discharge and nosebleeds.   Eyes: Negative for photophobia and pain.  Respiratory: Positive for shortness of breath. Negative for hemoptysis and sputum production.   Cardiovascular: Negative for chest pain, orthopnea and claudication.  Gastrointestinal: Positive for abdominal pain, diarrhea, nausea and vomiting. Negative for melena.  Genitourinary: Negative for frequency and urgency.  Musculoskeletal: Positive for back pain. Negative for joint pain.  Skin: Negative for itching and rash.  Neurological: Negative for tingling and focal weakness.  Endo/Heme/Allergies: Negative for environmental allergies and polydipsia.  Psychiatric/Behavioral: Negative for hallucinations. The patient is not nervous/anxious.     Past Medical History:  Diagnosis Date  . Allergic rhinitis   . Anemia, iron deficiency   . Atrial arrhythmia   . Atrial fib/flutter, transient    following prior surgeries x 2  . Barrett's esophagus without dysplasia   . BPH (benign prostatic hyperplasia)   . Chronic LBP   . DDD (degenerative disc disease)   . Diverticulosis of colon   . Duodenal stricture   . Gastric outlet obstruction   . GERD (gastroesophageal reflux disease)   . Hemorrhoids   . Hyperlipidemia   . Hyperplastic colonic polyp 01/2000  . Hypertension   . Nephrolithiasis    hx of B  . Peptic ulcer disease with hemorrhage 08/2008   and [facility]. Pylori Ab negative  . Renal cyst   . Tubular adenoma of colon 03/2012    Past  Surgical History:  Procedure Laterality Date  . APPENDECTOMY    . BACK SURGERY  02/2003   L5  . BILROTH I PROCEDURE  2011   Dr Johney Maine  . HIATAL HERNIA REPAIR    . LUMBAR FUSION  2009     reports that he has quit smoking. He has never used smokeless tobacco. He reports that he does not drink alcohol or use  drugs.  Allergies  Allergen Reactions  . Codeine Phosphate Swelling  . Nsaids Other (See Comments)    ulcers    Family History  Problem Relation Age of Onset  . Cancer Mother        colon  . Colon polyps Mother   . Colon cancer Mother 69  . Cancer Father        colon and prostate cancer  . Colon polyps Father   . Colon cancer Father 44  . Cancer Paternal Uncle        colon  . Colon cancer Paternal Uncle 45  . Colon cancer Paternal Uncle 46  . Hypertension Other   . Cancer Cousin        colon  . Stomach cancer Neg Hx     Prior to Admission medications   Medication Sig Start Date End Date Taking? Authorizing Provider  acetaminophen (TYLENOL) 500 MG tablet Take 1,000 mg by mouth every 6 (six) hours as needed for mild pain or moderate pain.   Yes [provider]  Alum Hydroxide-Mag Trisilicate (GAVISCON) 63-87.5 MG CHEW Chew by mouth as needed.     Yes [provider]  atorvastatin (LIPITOR) 20 MG tablet TAKE 1 TABLET BY MOUTH EVERY DAY 07/18/16  Yes Plotnikov, Evie Lacks, MD  busPIRone (BUSPAR) 7.5 MG tablet TAKE 1 - 2 TABLETS DAILY AS DIRECTED FOR ANXIETY. 03/26/17  Yes Plotnikov, Evie Lacks, MD  cetirizine (ZYRTEC) 10 MG tablet Take 10 mg by mouth daily.   Yes [provider]  clotrimazole-betamethasone (LOTRISONE) cream Apply topically 2 (two) times daily. 08/22/16  Yes Plotnikov, Evie Lacks, MD  ferrous sulfate 325 (65 FE) MG tablet Take 325 mg by mouth 2 (two) times daily.     Yes [provider]  fluticasone (FLONASE) 50 MCG/ACT nasal spray Place 2 sprays into both nostrils daily. 02/22/17  Yes Plotnikov, Evie Lacks, MD  latanoprost (XALATAN) 0.005 % ophthalmic solution Place 1 drop into both eyes at bedtime. 03/16/17  Yes [provider]  metoCLOPramide (REGLAN) 5 MG tablet TAKE 1 TABLET (5 MG TOTAL) BY MOUTH EVERY 8 (EIGHT) HOURS AS NEEDED FOR NAUSEA. 03/22/17  Yes Plotnikov, Evie Lacks, MD  Multiple Vitamins-Minerals (CENTRUM SILVER PO)  Take by mouth daily.     Yes [provider]  ondansetron (ZOFRAN) 4 MG tablet TAKE 1 TABLET (4 MG TOTAL) BY MOUTH EVERY 8 (EIGHT) HOURS AS NEEDED FOR NAUSEA OR VOMITING. 11/18/15  Yes Plotnikov, Evie Lacks, MD  pantoprazole (PROTONIX) 40 MG tablet Take 1 tablet (40 mg total) by mouth 2 (two) times daily. 04/10/17  Yes Ladene Artist, MD  valsartan (DIOVAN) 320 MG tablet Take 1 tablet (320 mg total) by mouth daily. 02/06/17  Yes Plotnikov, Evie Lacks, MD  oxyCODONE (ROXICODONE) 15 MG immediate release tablet Take 1 tablet (15 mg total) by mouth 4 (four) times daily as needed for pain. For pain - Fill on or after 04/25/17 Please fill every 30 days Patient not taking: Reported on 05/10/2017 02/06/17   Plotnikov, Evie Lacks, MD  triamcinolone cream (KENALOG) 0.5 %  Apply 1 application topically 3 (three) times daily. Patient not taking: Reported on 05/10/2017 11/22/16   Plotnikov, Evie Lacks, MD    Physical Exam:  Constitutional: Obese elderly male who appears acutely sick Vitals:   05/10/17 1228 05/10/17 1230 05/10/17 1340 05/10/17 1424  BP: 99/62 99/62 109/89 113/85  Pulse: (!) 110 (!) 102 (!) 104 (!) 107  Resp: 17  16 (!) 21  Temp:      TempSrc:      SpO2: 100% 95% 98% 92%  Weight:      Height:       Eyes: PERRL, lids and conjunctivae normal ENMT: Mucous membranes are dry. Posterior pharynx clear of any exudate or lesions.  Neck: normal, supple, no masses, no thyromegaly Respiratory: Positive crackles on the right mid to lower lung field and left lower lung field. Normal respiratory effort. Patient able to talk in complete sentences.  Cardiovascular:Tachycardic,  no murmurs / rubs / gallops. No extremity edema. 2+ pedal pulses. No carotid bruits.  Abdomen: Moderate abdominal distention with decreased overall bowel sounds.  Musculoskeletal: no clubbing / cyanosis. No joint deformity upper and lower extremities. Good ROM, no contractures. Normal muscle tone.  Skin: no rashes, lesions,  ulcers. No induration Neurologic: CN 2-12 grossly intact. Sensation intact, DTR normal. Strength 5/5 in all 4.  Psychiatric: Normal judgment and insight. Alert and oriented x 3. Normal mood.     Labs on Admission: I have personally reviewed following labs and imaging studies  CBC:  Recent Labs Lab 05/10/17 0912 05/10/17 1008 05/10/17 1243  WBC 24.2*  --   --   NEUTROABS 21.7*  --   --   HGB 17.8* 16.7 16.0  HCT 52.3* 49.0 47.0  MCV 88.2  --   --   PLT 347  --   --    Basic Metabolic Panel:  Recent Labs Lab 05/10/17 0912 05/10/17 0950 05/10/17 1008 05/10/17 1243  NA 140  --  140 140  K 3.6  --  3.7 4.4  CL 102  --  111 109  CO2 14*  --   --   --   GLUCOSE 380*  --  316* 144*  BUN 17  --  21* 22*  CREATININE 1.88*  --  1.40* 1.50*  CALCIUM 10.4*  --   --   --   MG  --  2.3  --   --    GFR: Estimated Creatinine Clearance: 49.4 mL/min (A) (by C-G formula based on SCr of 1.5 mg/dL (H)). Liver Function Tests:  Recent Labs Lab 05/10/17 0912  AST 47*  ALT 21  ALKPHOS 113  BILITOT 0.6  PROT 7.9  ALBUMIN 4.2    Recent Labs Lab 05/10/17 0912  LIPASE 19   No results for input(s): AMMONIA in the last 168 hours. Coagulation Profile:  Recent Labs Lab 05/10/17 0920  INR 1.11   Cardiac Enzymes:  Recent Labs Lab 05/10/17 0920  TROPONINI <0.03   BNP (last 3 results) No results for input(s): PROBNP in the last 8760 hours. HbA1C: No results for input(s): HGBA1C in the last 72 hours. CBG: No results for input(s): GLUCAP in the last 168 hours. Lipid Profile: No results for input(s): CHOL, HDL, LDLCALC, TRIG, CHOLHDL, LDLDIRECT in the last 72 hours. Thyroid Function Tests:  Recent Labs  05/10/17 0920  TSH 1.401   Anemia Panel: No results for input(s): VITAMINB12, FOLATE, FERRITIN, TIBC, IRON, RETICCTPCT in the last 72 hours. Urine analysis:    Component Value Date/Time  COLORURINE YELLOW 06/12/2016 0833   APPEARANCEUR CLEAR 06/12/2016 0833    LABSPEC 1.010 06/12/2016 0833   PHURINE 6.0 06/12/2016 0833   GLUCOSEU NEGATIVE 06/12/2016 0833   HGBUR NEGATIVE 06/12/2016 0833   BILIRUBINUR NEGATIVE 06/12/2016 0833   KETONESUR NEGATIVE 06/12/2016 0833   PROTEINUR NEGATIVE 04/15/2010 1048   UROBILINOGEN 0.2 06/12/2016 0833   NITRITE NEGATIVE 06/12/2016 0833   LEUKOCYTESUR NEGATIVE 06/12/2016 0833   Sepsis Labs: No results found for this or any previous visit (from the past 240 hour(s)).   Radiological Exams on Admission: Ct Abdomen Pelvis W Contrast  Result Date: 05/10/2017 CLINICAL DATA:  Generalized abdominal pain EXAM: CT ABDOMEN AND PELVIS WITH CONTRAST TECHNIQUE: Multidetector CT imaging of the abdomen and pelvis was performed using the standard protocol following bolus administration of intravenous contrast. CONTRAST:  64mL ISOVUE-300 IOPAMIDOL (ISOVUE-300) INJECTION 61% COMPARISON:  07/22/2012, 06/29/2009 FINDINGS: Lower chest: Mild scarring is noted in the right lung base. A subpleural nodule is noted on the left best seen on image number 37 of series 7 stable from prior exams dating back to 2010. Hepatobiliary: The liver is within normal limits. The gallbladder is well distended but stable. No cholelithiasis is seen. Pancreas: Unremarkable. No pancreatic ductal dilatation or surrounding inflammatory changes. Spleen: Normal in size without focal abnormality. Adrenals/Urinary Tract: Adrenal glands are within normal limits. Scattered bilateral renal calculi are again seen as are multiple renal cysts. Some slight increase in the bulk of the renal stones is noted. The collecting systems and ureters are within normal limits. The bladder is decompressed. Stomach/Bowel: Scattered diverticular change of the colon is noted. No evidence of diverticulitis is seen. Mild inflammatory changes are noted within the small bowel in the right mid abdomen. Some associated ascites is noted. Venous engorgement is seen. Best visualized on the coronal imaging  there are changes consistent with an internal hernia with the be inflamed loops of small bowel within the hernia. No definitive incarceration is noted although there is some mild dilatation of the more proximal small bowel with a transition zone at the entrance to the hernia. Prior postsurgical changes of the stomach are noted. Vascular/Lymphatic: Aortic atherosclerosis. No enlarged abdominal or pelvic lymph nodes. Reproductive: Prostate is unremarkable. Musculoskeletal: Postsurgical changes are noted in the lower lumbar spine. No acute bony abnormality is noted. IMPRESSION: 1. Changes of internal hernia within the right mid abdomen with multiple loops of small bowel within. Some venous engorgement is identified although no obstruction of the herniated loops of bowel is noted. There is some more proximal dilatation of the small bowel secondary to the transition zone from the hernia. 2. Mild ascites.  This is likely reactive in nature 3. Chronic changes as described above. Electronically Signed   By: Inez Catalina M.D.   On: 05/10/2017 11:30   Dg Chest Port 1 View  Result Date: 05/10/2017 CLINICAL DATA:  Rule out pneumonia.  Tachycardia, short of breath EXAM: PORTABLE CHEST 1 VIEW COMPARISON:  08/26/2015 FINDINGS: Elevated right hemidiaphragm with right lower lobe atelectasis. Left lung clear. Negative for heart failure IMPRESSION: Right lower lobe atelectasis. Electronically Signed   By: Franchot Gallo M.D.   On: 05/10/2017 11:40    EKG: Independently reviewed. A. fib with RVR at 187 bpm  Assessment/Plan Internal abdominal hernia: Acute. Patient with complaints of abdominal pain. CT scan showing signs of possible internal hernia. Risk factors include history of previous surgeries. - Admit to stepdown - NPO - IVF NS at 154ml/hr  - IV pain control prn  pain as tolerated - Appreciate surgery consultative services, will follow-up for further recommendations  Nausea and vomiting - Secondary to above -  Antiemetics as needed  SIRS/ severe sepsis. Patient presented with WBC 24.2, lactic acid 7.04. Suspected intra-abdominal source of infection possibly gastroenteritis. CT scan showing signs of internal hernia. - Follow up blood cultures - Antibiotics of Zosyn given, de-escalate when medically appropriate   Acute kidney injury secondary to severe dehydration: Patient's baseline creatinine was previously 1.1- 1.17, but he presents with a elevated creatinine of 1.88. Patient was initially given 3 L of normal saline IV fluids with improvement of creatinine to 1.5. - IVF NS at 75 ml/hr - Follow-up repeat BMP in a.m.  Essential hypertension: Patient initially noted to be at the lower normal. - hold antihypertensive agents  A. fib with RVR: Chadsvasc=2. Currently holding anticoagulation for possible need of surgery. Patient previously noted of going into A. fib with RVR postop. - Continue to monitor   Hyperglycemia: Acute. Patient's blood glucose elevated to 380 on admission, but resolved with IV fluids. - Hemoglobin A1c in a.m - Continue to monitor placed on sliding scale insulin, if needed   Gerd/PUD - protonix IV DVT prophylaxis: SCD  Code Status: Full  Family Communication: Discuss plan of care with patient and family present at bedside Disposition Plan:  likely discharge home once medically stable  Consults called: General surgery Admission status: Inpatient  Norval Morton MD [facility] Pager [phone]  If 7PM-7AM, please contact night-coverage [email or web address] Password [facility]  05/10/2017, 2:29 PM

## 2017-05-10 NOTE — ED Triage Notes (Addendum)
Pt verbalizes generalized abdominal pain with n/v; denies diarrhea onset yesterday; pt believes may have been caused "by eating food that was left out for three hours."

## 2017-05-10 NOTE — ED Notes (Signed)
ABNORMAL LAB RESULT MD NANAVTI, RN Chapin

## 2017-05-10 NOTE — Transfer of Care (Signed)
Immediate Anesthesia Transfer of Care Note  Patient: Cody Frazier  Procedure(s) Performed: Procedure(s): EXPLORATORY LAPAROTOMY WITH ENTEROLYSIS (N/A)  Patient Location: PACU  Anesthesia Type:General  Level of Consciousness:  sedated, patient cooperative and responds to stimulation  Airway & Oxygen Therapy:Patient Spontanous Breathing and Patient connected to face mask oxgen  Post-op Assessment:  Report given to PACU RN and Post -op Vital signs reviewed and stable  Post vital signs:  Reviewed and stable  Last Vitals:  Vitals:   05/10/17 1900 05/10/17 2000  BP: 113/80 114/65  Pulse: (!) 117   Resp: (!) 26 (!) 24  Temp:      Complications: No apparent anesthesia complications

## 2017-05-10 NOTE — ED Notes (Signed)
Blood draw x 2 unsuccessful 

## 2017-05-10 NOTE — ED Notes (Signed)
Abnormal lab result MD Kathrynn Humble have been made aware

## 2017-05-10 NOTE — H&P (View-Only) (Signed)
[facility] Surgery Consult Note  Cody Frazier [facility] [birth year]  [id number].    Requesting MD: Kathrynn Humble Chief Complaint/Reason for Consult: abdominal pain  HPI:  Patient with onset of  Right sided abdominal pain, n/v, and associated SOB since Wednesday night. Ate some food that had been sitting out for a few hours. Since onset, pain has progressively worsened. Describes pain as constant, sharp, and not relieved by anything. Patient denies bloody or coffee ground emesis. Last BM 2 days ago was normal, no bright blood or melena. Patient denies fevers/chills, chest pain, urinary sxs. Surgical history significant for appendectomy, a bilroth procedure in 2011 by Dr. Johney Maine, and hiatal hernia repair. PMH significant for PUD with hx of bleeding ulcers, barrett's esophagus, diverticulosis, A.fib/flutter post operatively, HTN. Patient underwent endoscopy/colonoscopy a few weeks ago and was told he had gastritis at that time.    Patient does not take any blood thinning medications.   Lactic acid 3.6 from 7.04, trending down. BP soft, tachycardic. Afeb.   ROS: Review of Systems  Constitutional: Positive for malaise/fatigue. Negative for chills and fever.  Respiratory: Positive for shortness of breath. Negative for wheezing.   Cardiovascular: Negative for chest pain, palpitations and leg swelling.  Gastrointestinal: Positive for abdominal pain (Right sided ), nausea and vomiting. Negative for blood in stool, constipation, diarrhea and melena.  Genitourinary: Negative for dysuria, frequency and urgency.  Musculoskeletal: Positive for back pain.  All other systems reviewed and are negative.   Family History  Problem Relation Age of Onset  . Cancer Mother        colon  . Colon polyps Mother   . Colon cancer Mother 28  . Cancer Father        colon and prostate cancer  . Colon polyps Father   . Colon cancer Father 39  . Cancer Paternal Uncle        colon  . Colon cancer Paternal Uncle 43  .  Colon cancer Paternal Uncle 60  . Hypertension Other   . Cancer Cousin        colon  . Stomach cancer Neg Hx     Past Medical History:  Diagnosis Date  . Allergic rhinitis   . Anemia, iron deficiency   . Atrial arrhythmia   . Atrial fib/flutter, transient    following prior surgeries x 2  . Barrett's esophagus without dysplasia   . BPH (benign prostatic hyperplasia)   . Chronic LBP   . DDD (degenerative disc disease)   . Diverticulosis of colon   . Duodenal stricture   . Gastric outlet obstruction   . GERD (gastroesophageal reflux disease)   . Hemorrhoids   . Hyperlipidemia   . Hyperplastic colonic polyp 01/2000  . Hypertension   . Nephrolithiasis    hx of B  . Peptic ulcer disease with hemorrhage 08/2008   and GOO H. Pylori Ab negative  . Renal cyst   . Tubular adenoma of colon 03/2012    Past Surgical History:  Procedure Laterality Date  . APPENDECTOMY    . BACK SURGERY  02/2003   L5  . BILROTH I PROCEDURE  2011   Dr Johney Maine  . HIATAL HERNIA REPAIR    . LUMBAR FUSION  2009   Current Facility-Administered Medications for the 05/10/17 encounter [facility] Encounter)  Medication  . 0.9 %  sodium chloride infusion   Current Meds  Medication Sig  . acetaminophen (TYLENOL) 500 MG tablet Take 1,000 mg by mouth every 6 (six) hours as needed  for mild pain or moderate pain.  Marland Kitchen Alum Hydroxide-Mag Trisilicate (GAVISCON) 85-46.2 MG CHEW Chew by mouth as needed.    Marland Kitchen atorvastatin (LIPITOR) 20 MG tablet TAKE 1 TABLET BY MOUTH EVERY DAY  . busPIRone (BUSPAR) 7.5 MG tablet TAKE 1 - 2 TABLETS DAILY AS DIRECTED FOR ANXIETY.  . cetirizine (ZYRTEC) 10 MG tablet Take 10 mg by mouth daily.  . clotrimazole-betamethasone (LOTRISONE) cream Apply topically 2 (two) times daily.  . ferrous sulfate 325 (65 FE) MG tablet Take 325 mg by mouth 2 (two) times daily.    . fluticasone (FLONASE) 50 MCG/ACT nasal spray Place 2 sprays into both nostrils daily.  Marland Kitchen latanoprost (XALATAN) 0.005 %  ophthalmic solution Place 1 drop into both eyes at bedtime.  . metoCLOPramide (REGLAN) 5 MG tablet TAKE 1 TABLET (5 MG TOTAL) BY MOUTH EVERY 8 (EIGHT) HOURS AS NEEDED FOR NAUSEA.  . Multiple Vitamins-Minerals (CENTRUM SILVER PO) Take by mouth daily.    . ondansetron (ZOFRAN) 4 MG tablet TAKE 1 TABLET (4 MG TOTAL) BY MOUTH EVERY 8 (EIGHT) HOURS AS NEEDED FOR NAUSEA OR VOMITING.  . pantoprazole (PROTONIX) 40 MG tablet Take 1 tablet (40 mg total) by mouth 2 (two) times daily.  . valsartan (DIOVAN) 320 MG tablet Take 1 tablet (320 mg total) by mouth daily.    Social History:  reports that he has quit smoking. He has never used smokeless tobacco. He reports that he does not drink alcohol or use drugs.  Allergies:  Allergies  Allergen Reactions  . Codeine Phosphate Swelling  . Nsaids Other (See Comments)    ulcers     (Not in a hospital admission)  Blood pressure 99/62, pulse (!) 110, temperature 97.8 F (36.6 C), temperature source Axillary, resp. rate 17, height _0  (1.676 m), weight 97.5 kg (215 lb), SpO2 100 %. Physical Exam: Physical Exam  Constitutional: He is oriented to person, place, and time. He appears well-developed and well-nourished. He is cooperative.  Non-toxic appearance. No distress.  HENT:  Head: Normocephalic and atraumatic.  Right Ear: External ear normal.  Left Ear: External ear normal.  Nose: Nose normal.  Mouth/Throat: Mucous membranes are not pale, dry and not cyanotic.  Eyes: Conjunctivae and lids are normal. Pupils are equal, round, and reactive to light. No scleral icterus.  Neck: Trachea normal and normal range of motion. Neck supple.  Cardiovascular: Regular rhythm, S1 normal and S2 normal.  Tachycardia present.   Pulses:      Radial pulses are 2+ on the right side, and 2+ on the left side.       Dorsalis pedis pulses are 2+ on the right side, and 2+ on the left side.  Pulmonary/Chest: Effort normal and breath sounds normal. He has no decreased breath  sounds. He has no wheezes. He has no rhonchi. He has no rales.  Abdominal: He exhibits distension (moderate). He exhibits no shifting dullness. Bowel sounds are decreased. There is tenderness in the right upper quadrant and right lower quadrant. There is no rigidity, no rebound and no guarding. No hernia.  Musculoskeletal:  Hx of back surgery. Moving all limbs, no gross deformities or edema.   Neurological: He is alert and oriented to person, place, and time. He has normal strength. No sensory deficit.  Skin: Skin is warm, dry and intact.  Psychiatric: He has a normal mood and affect. His behavior is normal. Thought content normal.    Results for orders placed or performed during the hospital encounter of 05/10/17 (  from the past 48 hour(s))  Lipase, blood     Status: None   Collection Time: 05/10/17  9:12 AM  Result Value Ref Range   Lipase 19 11 - 51 U/L  Comprehensive metabolic panel     Status: Abnormal   Collection Time: 05/10/17  9:12 AM  Result Value Ref Range   Sodium 140 135 - 145 mmol/L    Comment: REPEATED TO VERIFY   Potassium 3.6 3.5 - 5.1 mmol/L   Chloride 102 101 - 111 mmol/L    Comment: REPEATED TO VERIFY   CO2 14 (L) 22 - 32 mmol/L    Comment: REPEATED TO VERIFY   Glucose, Bld 380 (H) 65 - 99 mg/dL   BUN 17 6 - 20 mg/dL   Creatinine, Ser 1.88 (H) 0.61 - 1.24 mg/dL   Calcium 10.4 (H) 8.9 - 10.3 mg/dL   Total Protein 7.9 6.5 - 8.1 g/dL   Albumin 4.2 3.5 - 5.0 g/dL   AST 47 (H) 15 - 41 U/L   ALT 21 17 - 63 U/L    Comment: RESULTS CONFIRMED BY MANUAL DILUTION   Alkaline Phosphatase 113 38 - 126 U/L   Total Bilirubin 0.6 0.3 - 1.2 mg/dL   GFR calc non Af Amer 34 (L) >60 mL/min   GFR calc Af Amer 40 (L) >60 mL/min    Comment: (NOTE) The eGFR has been calculated using the CKD EPI equation. This calculation has not been validated in all clinical situations. eGFR's persistently <60 mL/min signify possible Chronic Kidney Disease.    Anion gap 24 (H) 5 - 15     Comment: REPEATED TO VERIFY  CBC     Status: Abnormal   Collection Time: 05/10/17  9:12 AM  Result Value Ref Range   WBC 24.2 (H) 4.0 - 10.5 K/uL   RBC 5.93 (H) 4.22 - 5.81 MIL/uL   Hemoglobin 17.8 (H) 13.0 - 17.0 g/dL   HCT 52.3 (H) 39.0 - 52.0 %   MCV 88.2 78.0 - 100.0 fL   MCH 30.0 26.0 - 34.0 pg   MCHC 34.0 30.0 - 36.0 g/dL   RDW 15.5 11.5 - 15.5 %   Platelets 347 150 - 400 K/uL  Differential     Status: Abnormal   Collection Time: 05/10/17  9:12 AM  Result Value Ref Range   Neutrophils Relative % 89 %   Neutro Abs 21.7 (H) 1.7 - 7.7 K/uL   Lymphocytes Relative 6 %   Lymphs Abs 1.5 0.7 - 4.0 K/uL   Monocytes Relative 5 %   Monocytes Absolute 1.2 (H) 0.1 - 1.0 K/uL   Eosinophils Relative 0 %   Eosinophils Absolute 0.0 0.0 - 0.7 K/uL   Basophils Relative 0 %   Basophils Absolute 0.0 0.0 - 0.1 K/uL  TSH     Status: None   Collection Time: 05/10/17  9:20 AM  Result Value Ref Range   TSH 1.401 0.350 - 4.500 uIU/mL    Comment: Performed by a 3rd Generation assay with a functional sensitivity of <=0.01 uIU/mL.  Troponin I     Status: None   Collection Time: 05/10/17  9:20 AM  Result Value Ref Range   Troponin I <0.03 <0.03 ng/mL  APTT  (IF patient is taking Pradaxa)     Status: None   Collection Time: 05/10/17  9:20 AM  Result Value Ref Range   aPTT 31 24 - 36 seconds  Protime-INR (if pt is taking coumadin)  Status: None   Collection Time: 05/10/17  9:20 AM  Result Value Ref Range   Prothrombin Time 14.3 11.4 - 15.2 seconds   INR 1.11   Magnesium     Status: None   Collection Time: 05/10/17  9:50 AM  Result Value Ref Range   Magnesium 2.3 1.7 - 2.4 mg/dL  I-Stat CG4 Lactic Acid, ED     Status: Abnormal   Collection Time: 05/10/17 10:00 AM  Result Value Ref Range   Lactic Acid, Venous 7.04 (HH) 0.5 - 1.9 mmol/L   Comment NOTIFIED PHYSICIAN   I-stat chem 8, ed     Status: Abnormal   Collection Time: 05/10/17 10:08 AM  Result Value Ref Range   Sodium 140 135 - 145  mmol/L   Potassium 3.7 3.5 - 5.1 mmol/L   Chloride 111 101 - 111 mmol/L   BUN 21 (H) 6 - 20 mg/dL   Creatinine, Ser 1.40 (H) 0.61 - 1.24 mg/dL   Glucose, Bld 316 (H) 65 - 99 mg/dL   Calcium, Ion 1.06 (L) 1.15 - 1.40 mmol/L   TCO2 13 0 - 100 mmol/L   Hemoglobin 16.7 13.0 - 17.0 g/dL   HCT 49.0 39.0 - 52.0 %   Ct Abdomen Pelvis W Contrast  Result Date: 05/10/2017 CLINICAL DATA:  Generalized abdominal pain EXAM: CT ABDOMEN AND PELVIS WITH CONTRAST TECHNIQUE: Multidetector CT imaging of the abdomen and pelvis was performed using the standard protocol following bolus administration of intravenous contrast. CONTRAST:  36m ISOVUE-300 IOPAMIDOL (ISOVUE-300) INJECTION 61% COMPARISON:  07/22/2012, 06/29/2009 FINDINGS: Lower chest: Mild scarring is noted in the right lung base. A subpleural nodule is noted on the left best seen on image number 37 of series 7 stable from prior exams dating back to 2010. Hepatobiliary: The liver is within normal limits. The gallbladder is well distended but stable. No cholelithiasis is seen. Pancreas: Unremarkable. No pancreatic ductal dilatation or surrounding inflammatory changes. Spleen: Normal in size without focal abnormality. Adrenals/Urinary Tract: Adrenal glands are within normal limits. Scattered bilateral renal calculi are again seen as are multiple renal cysts. Some slight increase in the bulk of the renal stones is noted. The collecting systems and ureters are within normal limits. The bladder is decompressed. Stomach/Bowel: Scattered diverticular change of the colon is noted. No evidence of diverticulitis is seen. Mild inflammatory changes are noted within the small bowel in the right mid abdomen. Some associated ascites is noted. Venous engorgement is seen. Best visualized on the coronal imaging there are changes consistent with an internal hernia with the be inflamed loops of small bowel within the hernia. No definitive incarceration is noted although there is some  mild dilatation of the more proximal small bowel with a transition zone at the entrance to the hernia. Prior postsurgical changes of the stomach are noted. Vascular/Lymphatic: Aortic atherosclerosis. No enlarged abdominal or pelvic lymph nodes. Reproductive: Prostate is unremarkable. Musculoskeletal: Postsurgical changes are noted in the lower lumbar spine. No acute bony abnormality is noted. IMPRESSION: 1. Changes of internal hernia within the right mid abdomen with multiple loops of small bowel within. Some venous engorgement is identified although no obstruction of the herniated loops of bowel is noted. There is some more proximal dilatation of the small bowel secondary to the transition zone from the hernia. 2. Mild ascites.  This is likely reactive in nature 3. Chronic changes as described above. Electronically Signed   By: MInez CatalinaM.D.   On: 05/10/2017 11:30   [facility]  Result Date: 05/10/2017 CLINICAL DATA:  Rule out pneumonia.  Tachycardia, short of breath EXAM: PORTABLE CHEST 1 VIEW COMPARISON:  08/26/2015 FINDINGS: Elevated right hemidiaphragm with right lower lobe atelectasis. Left lung clear. Negative for heart failure IMPRESSION: Right lower lobe atelectasis. Electronically Signed   By: Franchot Gallo M.D.   On: 05/10/2017 11:40      Assessment/Plan Abdominal pain and emesis - CT - Changes of internal hernia within the right mid abdomen with multiple loops of small bowel within. Some venous engorgement is identified although no obstruction of the herniated loops of bowel is noted. There is some more proximal dilatation of the small bowel secondary to the transition zone from the hernia. - NPO, IVF - MD to see and evaluate further  Brigid Re, [facility] Surgery 05/10/2017, 12:48 PM Pager: [phone] Consults: [phone] Mon-Fri 7:00 am-4:30 pm Sat-Sun 7:00 am-11:30 am

## 2017-05-10 NOTE — Anesthesia Preprocedure Evaluation (Addendum)
Anesthesia Evaluation  Patient identified by MRN, date of birth, ID band Patient awake    Reviewed: Allergy & Precautions, NPO status , Patient's Chart, lab work & pertinent test results  Airway Mallampati: III  TM Distance: >3 FB Neck ROM: Full    Dental  (+) Dental Advisory Given   Pulmonary former smoker,    breath sounds clear to auscultation       Cardiovascular hypertension, Pt. on medications + DOE  + dysrhythmias Atrial Fibrillation  Rhythm:Irregular Rate:Tachycardia     Neuro/Psych negative neurological ROS     GI/Hepatic Neg liver ROS, PUD, GERD  ,Internal hernia   Endo/Other  negative endocrine ROS  Renal/GU ARFRenal disease     Musculoskeletal  (+) Arthritis ,   Abdominal   Peds  Hematology negative hematology ROS (+)   Anesthesia Other Findings   Reproductive/Obstetrics                            Lab Results  Component Value Date   WBC 21.9 (H) 05/10/2017   HGB 15.5 05/10/2017   HCT 44.5 05/10/2017   MCV 85.4 05/10/2017   PLT 274 05/10/2017   Lab Results  Component Value Date   CREATININE 1.50 (H) 05/10/2017   BUN 22 (H) 05/10/2017   NA 140 05/10/2017   K 4.4 05/10/2017   CL 109 05/10/2017   CO2 14 (L) 05/10/2017    Anesthesia Physical Anesthesia Plan  ASA: III and emergent  Anesthesia Plan: General   Post-op Pain Management:    Induction: Intravenous and Rapid sequence  PONV Risk Score and Plan: 4 or greater and Ondansetron, Dexamethasone, Propofol, Midazolam and Treatment may vary due to age or medical condition  Airway Management Planned: Oral ETT  Additional Equipment:   Intra-op Plan:   Post-operative Plan: Possible Post-op intubation/ventilation  Informed Consent: I have reviewed the patients History and Physical, chart, labs and discussed the procedure including the risks, benefits and alternatives for the proposed anesthesia with the  patient or authorized representative who has indicated his/her understanding and acceptance.   Dental advisory given  Plan Discussed with: CRNA  Anesthesia Plan Comments:        Anesthesia Quick Evaluation

## 2017-05-10 NOTE — ED Notes (Signed)
Surgery at bedside.

## 2017-05-10 NOTE — ED Notes (Signed)
Patient converted back into a NSR-MD at bedside/aware

## 2017-05-10 NOTE — Op Note (Signed)
Surgeon: Kaylyn Lim, MD, FACS  Asst:  none  Anes:  general  Preop Dx: Abdominal pain with CT scan findings suggesting internal hernia Postop Dx: Small bowel torsion around adhesive bands with internal hernia and ischemia  Procedure: Laparotomy with extensive enteral lysis, reduction of twisted small bowel and placement and normal position. Location Surgery: [place] room 1 Complications: None noted  EBL:   20 cc  Drains: None  Description of Procedure:  The patient was taken to OR 1 .  After anesthesia was administered and the patient was prepped a timeout was performed.  A Foley catheter was inserted. Abdomen was prepped with Technicare and draped sterilely. I excised a small portion of his old incision above the umbilicus and was able to enter the abdomen through numerous adhesions. No enterotomies were created. I began working with sharp dissection and with the Bovie to take down numerous omental adhesions. And working toward the right upper quadrant encountered bright red markedly injected small bowel with mesenteric thickening. It was not clear where this was stuck or if there was an retro-mesenteric hernia or not. I elected to began at the terminal ileum and will walk moderate proximally and in the proximal ileum encountered a segment of bowel that I reduce that was obviously twisted in this pocket. I gently reduced entire segment of bowel and ran proximally toward the ligament of Treitz. Patient's had prior B2 bypass. Following the reduction allow the bowel to stay in the abdomen to reperfuse. While I did that I lysed adhesions from both sides of the table and found a dense band in the right lower quadrant around wish my hand could easily go into a pocket on the ring the right upper quadrant. I suspect this was the site of this obstruction and elected to go ahead and completely lysed that in addition and numerous other adhesions to the anterior abdominal wall. When completed the small  intestine was returned to its anatomic position. I was satisfied that the bowel was not infarcted and that with reperfusion would hopefully survive. Sponge and needle counts reported as correct. I closed the abdomen with running double-stranded #1 PDS from above and below and tied in the middle area of the wound was irrigated and closed staples. Patient was returned to his room in the ICU. Family was informed of the findings and the need to monitor him closely.     Matt B. Hassell Done, [place], [facility] Surgery, [place]

## 2017-05-10 NOTE — ED Notes (Signed)
rn at bedside collecting labs 

## 2017-05-11 ENCOUNTER — Encounter (HOSPITAL_COMMUNITY): Payer: Self-pay | Admitting: Surgery

## 2017-05-11 DIAGNOSIS — K458 Other specified abdominal hernia without obstruction or gangrene: Secondary | ICD-10-CM

## 2017-05-11 LAB — BASIC METABOLIC PANEL
Anion gap: 8 (ref 5–15)
BUN: 24 mg/dL — ABNORMAL HIGH (ref 6–20)
CO2: 22 mmol/L (ref 22–32)
Calcium: 8.4 mg/dL — ABNORMAL LOW (ref 8.9–10.3)
Chloride: 113 mmol/L — ABNORMAL HIGH (ref 101–111)
Creatinine, Ser: 1.19 mg/dL (ref 0.61–1.24)
GFR calc Af Amer: >60 mL/min (ref >60)
GFR calc non Af Amer: 60 mL/min — ABNORMAL LOW (ref >60)
Glucose, Bld: 168 mg/dL — ABNORMAL HIGH (ref 65–99)
Potassium: 4 mmol/L (ref 3.5–5.1)
Sodium: 143 mmol/L (ref 135–145)

## 2017-05-11 LAB — CBC
HCT: 36.5 % — ABNORMAL LOW (ref 39.0–52.0)
Hemoglobin: 12 g/dL — ABNORMAL LOW (ref 13.0–17.0)
MCH: 28.8 pg (ref 26.0–34.0)
MCHC: 32.9 g/dL (ref 30.0–36.0)
MCV: 87.5 fL (ref 78.0–100.0)
Platelets: 209 10*3/uL (ref 150–400)
RBC: 4.17 MIL/uL — ABNORMAL LOW (ref 4.22–5.81)
RDW: 16.6 % — ABNORMAL HIGH (ref 11.5–15.5)
WBC: 13.2 10*3/uL — ABNORMAL HIGH (ref 4.0–10.5)

## 2017-05-11 LAB — LACTIC ACID, PLASMA: Lactic Acid, Venous: 1.5 mmol/L (ref 0.5–1.9)

## 2017-05-11 MED ORDER — ONDANSETRON 4 MG PO TBDP: 4.0000 mg | ORAL_TABLET | Freq: Four times a day (QID) | ORAL | Status: DC | PRN

## 2017-05-11 MED ORDER — DILTIAZEM HCL-DEXTROSE 100-5 MG/100ML-% IV SOLN (PREMIX)
5.0000 mg/h | INTRAVENOUS | Status: DC
  Administered 2017-05-11: 10 mg/h via INTRAVENOUS
  Administered 2017-05-11: 5 mg/h via INTRAVENOUS
  Administered 2017-05-12: 10 mg/h via INTRAVENOUS
  Filled 2017-05-11 (×3): qty 100

## 2017-05-11 MED ORDER — HEPARIN SODIUM (PORCINE) 5000 UNIT/ML IJ SOLN
5000.0000 [IU] | Freq: Three times a day (TID) | INTRAMUSCULAR | Status: DC
  Administered 2017-05-11 – 2017-05-16 (×16): 5000 [IU] via SUBCUTANEOUS
  Filled 2017-05-11 (×16): qty 1

## 2017-05-11 MED ORDER — KCL IN DEXTROSE-NACL 20-5-0.45 MEQ/L-%-% IV SOLN
INTRAVENOUS | Status: DC
  Administered 2017-05-11: 05:00:00 via INTRAVENOUS
  Filled 2017-05-11: qty 1000

## 2017-05-11 MED ORDER — LATANOPROST 0.005 % OP SOLN
1.0000 [drp] | Freq: Every day | OPHTHALMIC | Status: DC
  Administered 2017-05-11 – 2017-05-16 (×5): 1 [drp] via OPHTHALMIC
  Filled 2017-05-11: qty 2.5

## 2017-05-11 MED ORDER — ONDANSETRON HCL 4 MG/2ML IJ SOLN: 4.0000 mg | Freq: Four times a day (QID) | INTRAMUSCULAR | Status: DC | PRN

## 2017-05-11 MED ORDER — FLUTICASONE PROPIONATE 50 MCG/ACT NA SUSP
2.0000 | Freq: Every day | NASAL | Status: DC
  Administered 2017-05-12 – 2017-05-16 (×5): 2 via NASAL
  Filled 2017-05-11: qty 16

## 2017-05-11 NOTE — Progress Notes (Addendum)
PROGRESS NOTE    Cody Frazier  DPO:[id number] DOB: 10-13-1946 DOA: 05/10/2017 PCP: Cassandria Anger, MD     Brief Narrative:  Cody Frazier is a 71 y.o. male with medical history significant of HTN, A. fib, PUD, GERD;  who presented with complaints of right-sided abdominal pain with nausea and vomiting Wednesday night (6/27). Symptoms started after he ate food that had been sitting out for over 3 hours. Patient was awoken out of sleep due to symptoms. Pain is constant and sharp. He's had multiple episodes of emesis which were noted to be nonbloody. He reports that he still able to pass flatus and has been belching a lot, but his last bowel movement was 2 days ago. While waiting in the waiting room in the ED, patient had what was reported to be a seizure-like episode where his eyes rolled up and he had a blank stare and was diaphoretic for which he was taken back immediately for further evaluation. On initial EKG patient was noted to be in A. fib with RVR 187 bpm, but heart rate improved with IV fluids. CT scan of the abdomen showed signs of possible internal hernia. Gen. surgery was consulted.  Assessment & Plan:   Principal Problem:   Internal hernia Active Problems:   Essential hypertension   Severe sepsis (HCC)   Dehydration   AKI (acute kidney injury) ([place])   Nausea and vomiting   Internal abdominal hernia -S/p Laparotomy with extensive enteral lysis 6/29  -Per general surgery  -Zosyn   Acute kidney injury secondary to severe dehydration -Patient's baseline creatinine was previously 1.1- 1.17 -Resolved with IVF   Essential hypertension -Hold oral agents for now, BP stable   A. fib with RVR -Chadsvasc=2. Patient previously noted of going into A. fib with RVR postop. Has seen cardiology after last episode after back surgery and was not started on anticoagulation  -Now normal sinus rhythm, continue to monitor  -Addendum: After using bedside commode this afternoon,  patient converted to atrial fibrillation RVR with rate into the 170s. Patient had mostly remained asymptomatic. EKG reviewed. We'll start Cardizem drip. We will need to address anticoagulation in the postop period   GERD -Protonix    DVT prophylaxis: subq hep Code Status: full Family Communication: wife at bedside Disposition Plan: pending improvement   Consultants:   General Surgery  Procedures:  Laparotomy with extensive enteral lysis 6/29   Antimicrobials:  Anti-infectives    Start     Dose/Rate Route Frequency Ordered Stop   05/10/17 2000  piperacillin-tazobactam (ZOSYN) IVPB 3.375 g     3.375 g 12.5 mL/hr over 240 Minutes Intravenous Every 8 hours 05/10/17 1106     05/10/17 1045  piperacillin-tazobactam (ZOSYN) IVPB 3.375 g     3.375 g 100 mL/hr over 30 Minutes Intravenous  Once 05/10/17 1043 05/10/17 1249       Subjective: Feeling well this morning. Still has some abdominal pain but no nausea or vomiting. Has not passed any gas or stool   Objective: Vitals:   05/11/17 0800 05/11/17 0900 05/11/17 1000 05/11/17 1200  BP: 129/68 131/66    Pulse: 95 95 (!) 109   Resp: (!) 27 (!) 28 16   Temp: 99 F (37.2 C)   98.2 F (36.8 C)  TempSrc: Oral   Oral  SpO2: 94% 91% 93%   Weight:      Height:        Intake/Output Summary (Last 24 hours) at 05/11/17 1406 Last data filed  at 05/11/17 0856  Gross per 24 hour  Intake          4653.33 ml  Output             1125 ml  Net          3528.33 ml   Filed Weights   05/10/17 0846  Weight: 97.5 kg (215 lb)    Examination:  General exam: Appears calm and comfortable  Respiratory system: Clear to auscultation. Respiratory effort normal. Cardiovascular system: S1 & S2 heard, Tachycardic, regular rhythm. No JVD, murmurs, rubs, gallops or clicks. No pedal edema. Gastrointestinal system: Abdomen is nondistended, soft and TTP Right side, +incision with dressing dry and clean . No organomegaly or masses felt. Central nervous  system: Alert and oriented. No focal neurological deficits. Extremities: Symmetric  Skin: No rashes, lesions or ulcers on exposed skin  Psychiatry: Judgement and insight appear normal. Mood & affect appropriate.   Data Reviewed: I have personally reviewed following labs and imaging studies  CBC:  Recent Labs Lab 05/10/17 0912 05/10/17 1008 05/10/17 1243 05/10/17 1556 05/11/17 0728  WBC 24.2*  --   --  21.9* 13.2*  NEUTROABS 21.7*  --   --   --   --   HGB 17.8* 16.7 16.0 15.5 12.0*  HCT 52.3* 49.0 47.0 44.5 36.5*  MCV 88.2  --   --  85.4 87.5  PLT 347  --   --  274 756   Basic Metabolic Panel:  Recent Labs Lab 05/10/17 0912 05/10/17 0950 05/10/17 1008 05/10/17 1243 05/11/17 0728  NA 140  --  140 140 143  K 3.6  --  3.7 4.4 4.0  CL 102  --  111 109 113*  CO2 14*  --   --   --  22  GLUCOSE 380*  --  316* 144* 168*  BUN 17  --  21* 22* 24*  CREATININE 1.88*  --  1.40* 1.50* 1.19  CALCIUM 10.4*  --   --   --  8.4*  MG  --  2.3  --   --   --    GFR: Estimated Creatinine Clearance: 62.3 mL/min (by C-G formula based on SCr of 1.19 mg/dL). Liver Function Tests:  Recent Labs Lab 05/10/17 0912  AST 47*  ALT 21  ALKPHOS 113  BILITOT 0.6  PROT 7.9  ALBUMIN 4.2    Recent Labs Lab 05/10/17 0912  LIPASE 19   No results for input(s): AMMONIA in the last 168 hours. Coagulation Profile:  Recent Labs Lab 05/10/17 0920  INR 1.11   Cardiac Enzymes:  Recent Labs Lab 05/10/17 0920  TROPONINI <0.03   BNP (last 3 results) No results for input(s): PROBNP in the last 8760 hours. HbA1C: No results for input(s): HGBA1C in the last 72 hours. CBG: No results for input(s): GLUCAP in the last 168 hours. Lipid Profile: No results for input(s): CHOL, HDL, LDLCALC, TRIG, CHOLHDL, LDLDIRECT in the last 72 hours. Thyroid Function Tests:  Recent Labs  05/10/17 0920  TSH 1.401   Anemia Panel: No results for input(s): VITAMINB12, FOLATE, FERRITIN, TIBC, IRON,  RETICCTPCT in the last 72 hours. Sepsis Labs:  Recent Labs Lab 05/10/17 1000 05/10/17 1244 05/11/17 0728  LATICACIDVEN 7.04* 3.60* 1.5    Recent Results (from the past 240 hour(s))  Surgical PCR screen     Status: Abnormal   Collection Time: 05/10/17  4:17 PM  Result Value Ref Range Status   MRSA, PCR NEGATIVE NEGATIVE Final  Staphylococcus aureus POSITIVE (A) NEGATIVE Final    Comment:        The Xpert SA Assay (FDA approved for NASAL specimens in patients over 41 years of age), is one component of a comprehensive surveillance program.  Test performance has been validated by [facility] for patients greater than or equal to 27 year old. It is not intended to diagnose infection nor to guide or monitor treatment.        Radiology Studies: Ct Abdomen Pelvis W Contrast  Result Date: 05/10/2017 CLINICAL DATA:  Generalized abdominal pain EXAM: CT ABDOMEN AND PELVIS WITH CONTRAST TECHNIQUE: Multidetector CT imaging of the abdomen and pelvis was performed using the standard protocol following bolus administration of intravenous contrast. CONTRAST:  78mL ISOVUE-300 IOPAMIDOL (ISOVUE-300) INJECTION 61% COMPARISON:  07/22/2012, 06/29/2009 FINDINGS: Lower chest: Mild scarring is noted in the right lung base. A subpleural nodule is noted on the left best seen on image number 37 of series 7 stable from prior exams dating back to 2010. Hepatobiliary: The liver is within normal limits. The gallbladder is well distended but stable. No cholelithiasis is seen. Pancreas: Unremarkable. No pancreatic ductal dilatation or surrounding inflammatory changes. Spleen: Normal in size without focal abnormality. Adrenals/Urinary Tract: Adrenal glands are within normal limits. Scattered bilateral renal calculi are again seen as are multiple renal cysts. Some slight increase in the bulk of the renal stones is noted. The collecting systems and ureters are within normal limits. The bladder is decompressed.  Stomach/Bowel: Scattered diverticular change of the colon is noted. No evidence of diverticulitis is seen. Mild inflammatory changes are noted within the small bowel in the right mid abdomen. Some associated ascites is noted. Venous engorgement is seen. Best visualized on the coronal imaging there are changes consistent with an internal hernia with the be inflamed loops of small bowel within the hernia. No definitive incarceration is noted although there is some mild dilatation of the more proximal small bowel with a transition zone at the entrance to the hernia. Prior postsurgical changes of the stomach are noted. Vascular/Lymphatic: Aortic atherosclerosis. No enlarged abdominal or pelvic lymph nodes. Reproductive: Prostate is unremarkable. Musculoskeletal: Postsurgical changes are noted in the lower lumbar spine. No acute bony abnormality is noted. IMPRESSION: 1. Changes of internal hernia within the right mid abdomen with multiple loops of small bowel within. Some venous engorgement is identified although no obstruction of the herniated loops of bowel is noted. There is some more proximal dilatation of the small bowel secondary to the transition zone from the hernia. 2. Mild ascites.  This is likely reactive in nature 3. Chronic changes as described above. Electronically Signed   By: Inez Catalina M.D.   On: 05/10/2017 11:30   Dg Chest Port 1 View  Result Date: 05/10/2017 CLINICAL DATA:  Rule out pneumonia.  Tachycardia, short of breath EXAM: PORTABLE CHEST 1 VIEW COMPARISON:  08/26/2015 FINDINGS: Elevated right hemidiaphragm with right lower lobe atelectasis. Left lung clear. Negative for heart failure IMPRESSION: Right lower lobe atelectasis. Electronically Signed   By: Franchot Gallo M.D.   On: 05/10/2017 11:40      Scheduled Meds: . fluticasone  2 spray Each Nare Daily  . heparin  5,000 Units Subcutaneous Q8H  . latanoprost  1 drop Both Eyes QHS  . pantoprazole (PROTONIX) IV  40 mg Intravenous  Q12H  . sodium chloride flush  3 mL Intravenous Q12H   Continuous Infusions: . sodium chloride 100 mL/hr at 05/11/17 0505  . sodium chloride    .  piperacillin-tazobactam (ZOSYN)  IV 3.375 g (05/11/17 1144)     LOS: 1 day    Time spent: 40 minutes   Dessa Phi, DO [facility] [email or web address] Password [email or web address] 05/11/2017, 2:06 PM

## 2017-05-11 NOTE — Progress Notes (Signed)
1 Day Post-Op   Subjective/Chief Complaint: Complains of abd pain but says he feels better   Objective: Vital signs in last 24 hours: Temp:  [98.2 F (36.8 C)-99.1 F (37.3 C)] 99 F (37.2 C) (06/30 0800) Pulse Rate:  [96-117] 96 (06/30 0700) Resp:  [16-30] 25 (06/30 0700) BP: (98-153)/(62-93) 116/68 (06/30 0700) SpO2:  [91 %-100 %] 93 % (06/30 0700) Last BM Date: 05/07/17  Intake/Output from previous day: 06/29 0701 - 06/30 0700 In: 7593.3 [I.V.:4143.3; IV Piggyback:3450] Out: 1125 [Urine:775; Blood:350] Intake/Output this shift: Total I/O In: 260 [I.V.:210; IV Piggyback:50] Out: -   General appearance: alert and cooperative Resp: clear to auscultation bilaterally Cardio: regular rate and rhythm GI: soft, mild tenderness. incision looks good. quiet  Lab Results:   Recent Labs  05/10/17 0912  05/10/17 1243 05/10/17 1556  WBC 24.2*  --   --  21.9*  HGB 17.8*  < > 16.0 15.5  HCT 52.3*  < > 47.0 44.5  PLT 347  --   --  274  < > = values in this interval not displayed. BMET  Recent Labs  05/10/17 0912  05/10/17 1243 05/11/17 0728  NA 140  < > 140 143  K 3.6  < > 4.4 4.0  CL 102  < > 109 113*  CO2 14*  --   --  22  GLUCOSE 380*  < > 144* 168*  BUN 17  < > 22* 24*  CREATININE 1.88*  < > 1.50* 1.19  CALCIUM 10.4*  --   --  8.4*  < > = values in this interval not displayed. PT/INR  Recent Labs  05/10/17 0920  LABPROT 14.3  INR 1.11   ABG  Recent Labs  05/10/17 1249  HCO3 19.4*    Studies/Results: Ct Abdomen Pelvis W Contrast  Result Date: 05/10/2017 CLINICAL DATA:  Generalized abdominal pain EXAM: CT ABDOMEN AND PELVIS WITH CONTRAST TECHNIQUE: Multidetector CT imaging of the abdomen and pelvis was performed using the standard protocol following bolus administration of intravenous contrast. CONTRAST:  39mL ISOVUE-300 IOPAMIDOL (ISOVUE-300) INJECTION 61% COMPARISON:  07/22/2012, 06/29/2009 FINDINGS: Lower chest: Mild scarring is noted in the right  lung base. A subpleural nodule is noted on the left best seen on image number 37 of series 7 stable from prior exams dating back to 2010. Hepatobiliary: The liver is within normal limits. The gallbladder is well distended but stable. No cholelithiasis is seen. Pancreas: Unremarkable. No pancreatic ductal dilatation or surrounding inflammatory changes. Spleen: Normal in size without focal abnormality. Adrenals/Urinary Tract: Adrenal glands are within normal limits. Scattered bilateral renal calculi are again seen as are multiple renal cysts. Some slight increase in the bulk of the renal stones is noted. The collecting systems and ureters are within normal limits. The bladder is decompressed. Stomach/Bowel: Scattered diverticular change of the colon is noted. No evidence of diverticulitis is seen. Mild inflammatory changes are noted within the small bowel in the right mid abdomen. Some associated ascites is noted. Venous engorgement is seen. Best visualized on the coronal imaging there are changes consistent with an internal hernia with the be inflamed loops of small bowel within the hernia. No definitive incarceration is noted although there is some mild dilatation of the more proximal small bowel with a transition zone at the entrance to the hernia. Prior postsurgical changes of the stomach are noted. Vascular/Lymphatic: Aortic atherosclerosis. No enlarged abdominal or pelvic lymph nodes. Reproductive: Prostate is unremarkable. Musculoskeletal: Postsurgical changes are noted in the lower  lumbar spine. No acute bony abnormality is noted. IMPRESSION: 1. Changes of internal hernia within the right mid abdomen with multiple loops of small bowel within. Some venous engorgement is identified although no obstruction of the herniated loops of bowel is noted. There is some more proximal dilatation of the small bowel secondary to the transition zone from the hernia. 2. Mild ascites.  This is likely reactive in nature 3.  Chronic changes as described above. Electronically Signed   By: Inez Catalina M.D.   On: 05/10/2017 11:30   Dg Chest Port 1 View  Result Date: 05/10/2017 CLINICAL DATA:  Rule out pneumonia.  Tachycardia, short of breath EXAM: PORTABLE CHEST 1 VIEW COMPARISON:  08/26/2015 FINDINGS: Elevated right hemidiaphragm with right lower lobe atelectasis. Left lung clear. Negative for heart failure IMPRESSION: Right lower lobe atelectasis. Electronically Signed   By: Franchot Gallo M.D.   On: 05/10/2017 11:40    Anti-infectives: Anti-infectives    Start     Dose/Rate Route Frequency Ordered Stop   05/10/17 2000  piperacillin-tazobactam (ZOSYN) IVPB 3.375 g     3.375 g 12.5 mL/hr over 240 Minutes Intravenous Every 8 hours 05/10/17 1106     05/10/17 1045  piperacillin-tazobactam (ZOSYN) IVPB 3.375 g     3.375 g 100 mL/hr over 30 Minutes Intravenous  Once 05/10/17 1043 05/10/17 1249      Assessment/Plan: s/p Procedure(s): EXPLORATORY LAPAROTOMY WITH ENTEROLYSIS (N/A) continue bowel rest until bowel function returns  Ambulate POD1  LOS: 1 day    TOTH III,PAUL S 05/11/2017

## 2017-05-12 ENCOUNTER — Inpatient Hospital Stay (HOSPITAL_COMMUNITY): Payer: Medicare Other

## 2017-05-12 DIAGNOSIS — I340 Nonrheumatic mitral (valve) insufficiency: Secondary | ICD-10-CM

## 2017-05-12 LAB — ECHOCARDIOGRAM COMPLETE
Height: 66 in
Weight: 3440 oz

## 2017-05-12 LAB — CBC
HCT: 33 % — ABNORMAL LOW (ref 39.0–52.0)
Hemoglobin: 10.9 g/dL — ABNORMAL LOW (ref 13.0–17.0)
MCH: 29.5 pg (ref 26.0–34.0)
MCHC: 33 g/dL (ref 30.0–36.0)
MCV: 89.2 fL (ref 78.0–100.0)
Platelets: 212 10*3/uL (ref 150–400)
RBC: 3.7 MIL/uL — ABNORMAL LOW (ref 4.22–5.81)
RDW: 16.7 % — ABNORMAL HIGH (ref 11.5–15.5)
WBC: 9.3 10*3/uL (ref 4.0–10.5)

## 2017-05-12 LAB — BASIC METABOLIC PANEL
Anion gap: 4 — ABNORMAL LOW (ref 5–15)
BUN: 22 mg/dL — ABNORMAL HIGH (ref 6–20)
CO2: 24 mmol/L (ref 22–32)
Calcium: 8.6 mg/dL — ABNORMAL LOW (ref 8.9–10.3)
Chloride: 114 mmol/L — ABNORMAL HIGH (ref 101–111)
Creatinine, Ser: 0.92 mg/dL (ref 0.61–1.24)
GFR calc Af Amer: >60 mL/min (ref >60)
GFR calc non Af Amer: >60 mL/min (ref >60)
Glucose, Bld: 128 mg/dL — ABNORMAL HIGH (ref 65–99)
Potassium: 4 mmol/L (ref 3.5–5.1)
Sodium: 142 mmol/L (ref 135–145)

## 2017-05-12 NOTE — Progress Notes (Signed)
2 Days Post-Op   Subjective/Chief Complaint: No complaints. Had 2 bm's last night   Objective: Vital signs in last 24 hours: Temp:  [97.5 F (36.4 C)-99.4 F (37.4 C)] 98.2 F (36.8 C) (07/01 0800) Pulse Rate:  [73-169] 114 (07/01 0600) Resp:  [16-36] 25 (07/01 0600) BP: (95-134)/(58-96) 114/82 (07/01 0600) SpO2:  [88 %-95 %] 94 % (07/01 0600) Last BM Date: 05/07/17  Intake/Output from previous day: 06/30 0701 - 07/01 0700 In: 2893.7 [I.V.:2743.7; IV Piggyback:150] Out: 2841 [Urine:1525] Intake/Output this shift: No intake/output data recorded.  General appearance: alert and cooperative Resp: clear to auscultation bilaterally Cardio: regular rate and rhythm GI: soft, mild tenderness. incision looks good. good bs  Lab Results:   Recent Labs  05/10/17 1556 05/11/17 0728  WBC 21.9* 13.2*  HGB 15.5 12.0*  HCT 44.5 36.5*  PLT 274 209   BMET  Recent Labs  05/10/17 0912  05/10/17 1243 05/11/17 0728  NA 140  < > 140 143  K 3.6  < > 4.4 4.0  CL 102  < > 109 113*  CO2 14*  --   --  22  GLUCOSE 380*  < > 144* 168*  BUN 17  < > 22* 24*  CREATININE 1.88*  < > 1.50* 1.19  CALCIUM 10.4*  --   --  8.4*  < > = values in this interval not displayed. PT/INR  Recent Labs  05/10/17 0920  LABPROT 14.3  INR 1.11   ABG  Recent Labs  05/10/17 1249  HCO3 19.4*    Studies/Results: Ct Abdomen Pelvis W Contrast  Result Date: 05/10/2017 CLINICAL DATA:  Generalized abdominal pain EXAM: CT ABDOMEN AND PELVIS WITH CONTRAST TECHNIQUE: Multidetector CT imaging of the abdomen and pelvis was performed using the standard protocol following bolus administration of intravenous contrast. CONTRAST:  60mL ISOVUE-300 IOPAMIDOL (ISOVUE-300) INJECTION 61% COMPARISON:  07/22/2012, 06/29/2009 FINDINGS: Lower chest: Mild scarring is noted in the right lung base. A subpleural nodule is noted on the left best seen on image number 37 of series 7 stable from prior exams dating back to 2010.  Hepatobiliary: The liver is within normal limits. The gallbladder is well distended but stable. No cholelithiasis is seen. Pancreas: Unremarkable. No pancreatic ductal dilatation or surrounding inflammatory changes. Spleen: Normal in size without focal abnormality. Adrenals/Urinary Tract: Adrenal glands are within normal limits. Scattered bilateral renal calculi are again seen as are multiple renal cysts. Some slight increase in the bulk of the renal stones is noted. The collecting systems and ureters are within normal limits. The bladder is decompressed. Stomach/Bowel: Scattered diverticular change of the colon is noted. No evidence of diverticulitis is seen. Mild inflammatory changes are noted within the small bowel in the right mid abdomen. Some associated ascites is noted. Venous engorgement is seen. Best visualized on the coronal imaging there are changes consistent with an internal hernia with the be inflamed loops of small bowel within the hernia. No definitive incarceration is noted although there is some mild dilatation of the more proximal small bowel with a transition zone at the entrance to the hernia. Prior postsurgical changes of the stomach are noted. Vascular/Lymphatic: Aortic atherosclerosis. No enlarged abdominal or pelvic lymph nodes. Reproductive: Prostate is unremarkable. Musculoskeletal: Postsurgical changes are noted in the lower lumbar spine. No acute bony abnormality is noted. IMPRESSION: 1. Changes of internal hernia within the right mid abdomen with multiple loops of small bowel within. Some venous engorgement is identified although no obstruction of the herniated loops of  bowel is noted. There is some more proximal dilatation of the small bowel secondary to the transition zone from the hernia. 2. Mild ascites.  This is likely reactive in nature 3. Chronic changes as described above. Electronically Signed   By: Inez Catalina M.D.   On: 05/10/2017 11:30   Dg Chest Port 1 View  Result  Date: 05/10/2017 CLINICAL DATA:  Rule out pneumonia.  Tachycardia, short of breath EXAM: PORTABLE CHEST 1 VIEW COMPARISON:  08/26/2015 FINDINGS: Elevated right hemidiaphragm with right lower lobe atelectasis. Left lung clear. Negative for heart failure IMPRESSION: Right lower lobe atelectasis. Electronically Signed   By: Franchot Gallo M.D.   On: 05/10/2017 11:40    Anti-infectives: Anti-infectives    Start     Dose/Rate Route Frequency Ordered Stop   05/10/17 2000  piperacillin-tazobactam (ZOSYN) IVPB 3.375 g     3.375 g 12.5 mL/hr over 240 Minutes Intravenous Every 8 hours 05/10/17 1106     05/10/17 1045  piperacillin-tazobactam (ZOSYN) IVPB 3.375 g     3.375 g 100 mL/hr over 30 Minutes Intravenous  Once 05/10/17 1043 05/10/17 1249      Assessment/Plan: s/p Procedure(s): EXPLORATORY LAPAROTOMY WITH ENTEROLYSIS (N/A) Advance diet. Start clears Ambulate A fib. On cardizem drip POD 2   LOS: 2 days    Cody Frazier,Cody Frazier S 05/12/2017

## 2017-05-12 NOTE — Progress Notes (Signed)
PROGRESS NOTE    Cody Frazier  QPY:[id number] DOB: 01/21/1946 DOA: 05/10/2017 PCP: Cassandria Anger, MD     Brief Narrative:  Cody Frazier is a 71 y.o. male with medical history significant of HTN, paroxysmal A. fib, PUD, GERD;  who presented with complaints of right-sided abdominal pain with nausea and vomiting Wednesday night (6/27). Symptoms started after he ate food that had been sitting out for over 3 hours. Patient was awoken out of sleep due to symptoms. Pain is constant and sharp. He's had multiple episodes of emesis which were noted to be nonbloody. He reports that he still able to pass flatus and has been belching a lot, but his last bowel movement was 2 days ago. While waiting in the waiting room in the ED, patient had what was reported to be a seizure-like episode where his eyes rolled up and he had a blank stare and was diaphoretic for which he was taken back immediately for further evaluation. On initial EKG patient was noted to be in A. fib with RVR 187 bpm, but heart rate improved with IV fluids. CT scan of the abdomen showed signs of possible internal hernia. Gen. surgery was consulted. Patient underwent exploratory laparotomy with enteral lysis of adhesions 6/29.   Assessment & Plan:   Principal Problem:   Internal hernia Active Problems:   Essential hypertension   Severe sepsis (HCC)   Dehydration   AKI (acute kidney injury) ([place])   Nausea and vomiting   Internal abdominal hernia -S/p Laparotomy with extensive enteral lysis 6/29  -Per general surgery  -Zosyn   Acute kidney injury secondary to severe dehydration -Patient's baseline creatinine was previously 1.1- 1.17 -Resolved with IVF   Essential hypertension -Hold oral agents for now, BP stable   A. fib with RVR -Chadsvasc=2. Patient previously noted of going into A. fib with RVR postop. Has seen cardiology (Dr. Percival Spanish) after last episode after back surgery and was not started on anticoagulation.  Went into A Fib RVR yesterday, treated with IV cardizem drip, converted to normal sinus soon after.  -Now normal sinus rhythm, continue to monitor  -Echo -Will need to follow up with cardiology   GERD -Protonix    DVT prophylaxis: subq hep Code Status: full Family Communication: wife at bedside Disposition Plan: pending improvement   Consultants:   General Surgery  Procedures:  Laparotomy with extensive enteral lysis 6/29   Antimicrobials:  Anti-infectives    Start     Dose/Rate Route Frequency Ordered Stop   05/10/17 2000  piperacillin-tazobactam (ZOSYN) IVPB 3.375 g     3.375 g 12.5 mL/hr over 240 Minutes Intravenous Every 8 hours 05/10/17 1106     05/10/17 1045  piperacillin-tazobactam (ZOSYN) IVPB 3.375 g     3.375 g 100 mL/hr over 30 Minutes Intravenous  Once 05/10/17 1043 05/10/17 1249       Subjective: Feeling well this morning. Still has some abdominal pain but no nausea or vomiting. Had BM.   Objective: Vitals:   05/12/17 0700 05/12/17 0800 05/12/17 0900 05/12/17 1000  BP: 115/74 120/66 126/60 126/67  Pulse: (!) 102 79 77 81  Resp: 20 18 19  (!) 21  Temp:  98.2 F (36.8 C)    TempSrc:  Oral    SpO2: 95% 93% 92% 95%  Weight:      Height:        Intake/Output Summary (Last 24 hours) at 05/12/17 1104 Last data filed at 05/12/17 0847  Gross per 24 hour  Intake          2681.49 ml  Output             1525 ml  Net          1156.49 ml   Filed Weights   05/10/17 0846  Weight: 97.5 kg (215 lb)    Examination:  General exam: Appears calm and comfortable  Respiratory system: Clear to auscultation. Respiratory effort normal. Cardiovascular system: S1 & S2 heard, RRR. No JVD, murmurs, rubs, gallops or clicks. No pedal edema. Gastrointestinal system: Abdomen is nondistended, soft and TTP Right side, +incision with dressing dry and clean . No organomegaly or masses felt. Central nervous system: Alert and oriented. No focal neurological  deficits. Extremities: Symmetric  Skin: No rashes, lesions or ulcers on exposed skin  Psychiatry: Judgement and insight appear normal. Mood & affect appropriate.   Data Reviewed: I have personally reviewed following labs and imaging studies  CBC:  Recent Labs Lab 05/10/17 0912 05/10/17 1008 05/10/17 1243 05/10/17 1556 05/11/17 0728 05/12/17 1033  WBC 24.2*  --   --  21.9* 13.2* 9.3  NEUTROABS 21.7*  --   --   --   --   --   HGB 17.8* 16.7 16.0 15.5 12.0* 10.9*  HCT 52.3* 49.0 47.0 44.5 36.5* 33.0*  MCV 88.2  --   --  85.4 87.5 89.2  PLT 347  --   --  274 209 527   Basic Metabolic Panel:  Recent Labs Lab 05/10/17 0912 05/10/17 0950 05/10/17 1008 05/10/17 1243 05/11/17 0728 05/12/17 1033  NA 140  --  140 140 143 142  K 3.6  --  3.7 4.4 4.0 4.0  CL 102  --  111 109 113* 114*  CO2 14*  --   --   --  22 24  GLUCOSE 380*  --  316* 144* 168* 128*  BUN 17  --  21* 22* 24* 22*  CREATININE 1.88*  --  1.40* 1.50* 1.19 0.92  CALCIUM 10.4*  --   --   --  8.4* 8.6*  MG  --  2.3  --   --   --   --    GFR: Estimated Creatinine Clearance: 80.5 mL/min (by C-G formula based on SCr of 0.92 mg/dL). Liver Function Tests:  Recent Labs Lab 05/10/17 0912  AST 47*  ALT 21  ALKPHOS 113  BILITOT 0.6  PROT 7.9  ALBUMIN 4.2    Recent Labs Lab 05/10/17 0912  LIPASE 19   No results for input(s): AMMONIA in the last 168 hours. Coagulation Profile:  Recent Labs Lab 05/10/17 0920  INR 1.11   Cardiac Enzymes:  Recent Labs Lab 05/10/17 0920  TROPONINI <0.03   BNP (last 3 results) No results for input(s): PROBNP in the last 8760 hours. HbA1C: No results for input(s): HGBA1C in the last 72 hours. CBG: No results for input(s): GLUCAP in the last 168 hours. Lipid Profile: No results for input(s): CHOL, HDL, LDLCALC, TRIG, CHOLHDL, LDLDIRECT in the last 72 hours. Thyroid Function Tests:  Recent Labs  05/10/17 0920  TSH 1.401   Anemia Panel: No results for  input(s): VITAMINB12, FOLATE, FERRITIN, TIBC, IRON, RETICCTPCT in the last 72 hours. Sepsis Labs:  Recent Labs Lab 05/10/17 1000 05/10/17 1244 05/11/17 0728  LATICACIDVEN 7.04* 3.60* 1.5    Recent Results (from the past 240 hour(s))  Blood Culture (routine x 2)     Status: None (Preliminary result)   Collection Time: 05/10/17  10:43 AM  Result Value Ref Range Status   Specimen Description BLOOD BLOOD RIGHT HAND  Final   Special Requests IN PEDIATRIC BOTTLE Blood Culture adequate volume  Final   Culture   Final    NO GROWTH 1 DAY Performed at [facility], [place] [address] [place], [place] [address]    Report Status PENDING  Incomplete  Blood Culture (routine x 2)     Status: None (Preliminary result)   Collection Time: 05/10/17 12:00 PM  Result Value Ref Range Status   Specimen Description BLOOD BLOOD LEFT HAND  Final   Special Requests   Final    BOTTLES DRAWN AEROBIC AND ANAEROBIC Blood Culture adequate volume   Culture   Final    NO GROWTH 1 DAY Performed at [facility], [place] [address], [place],  [address]    Report Status PENDING  Incomplete  Surgical PCR screen     Status: Abnormal   Collection Time: 05/10/17  4:17 PM  Result Value Ref Range Status   MRSA, PCR NEGATIVE NEGATIVE Final   Staphylococcus aureus POSITIVE (A) NEGATIVE Final    Comment:        The Xpert SA Assay (FDA approved for NASAL specimens in patients over 3 years of age), is one component of a comprehensive surveillance program.  Test performance has been validated by [facility] for patients greater than or equal to 21 year old. It is not intended to diagnose infection nor to guide or monitor treatment.        Radiology Studies: Ct Abdomen Pelvis W Contrast  Result Date: 05/10/2017 CLINICAL DATA:  Generalized abdominal pain EXAM: CT ABDOMEN AND PELVIS WITH CONTRAST TECHNIQUE: Multidetector CT imaging of the abdomen and pelvis was performed using the standard protocol  following bolus administration of intravenous contrast. CONTRAST:  64mL ISOVUE-300 IOPAMIDOL (ISOVUE-300) INJECTION 61% COMPARISON:  07/22/2012, 06/29/2009 FINDINGS: Lower chest: Mild scarring is noted in the right lung base. A subpleural nodule is noted on the left best seen on image number 37 of series 7 stable from prior exams dating back to 2010. Hepatobiliary: The liver is within normal limits. The gallbladder is well distended but stable. No cholelithiasis is seen. Pancreas: Unremarkable. No pancreatic ductal dilatation or surrounding inflammatory changes. Spleen: Normal in size without focal abnormality. Adrenals/Urinary Tract: Adrenal glands are within normal limits. Scattered bilateral renal calculi are again seen as are multiple renal cysts. Some slight increase in the bulk of the renal stones is noted. The collecting systems and ureters are within normal limits. The bladder is decompressed. Stomach/Bowel: Scattered diverticular change of the colon is noted. No evidence of diverticulitis is seen. Mild inflammatory changes are noted within the small bowel in the right mid abdomen. Some associated ascites is noted. Venous engorgement is seen. Best visualized on the coronal imaging there are changes consistent with an internal hernia with the be inflamed loops of small bowel within the hernia. No definitive incarceration is noted although there is some mild dilatation of the more proximal small bowel with a transition zone at the entrance to the hernia. Prior postsurgical changes of the stomach are noted. Vascular/Lymphatic: Aortic atherosclerosis. No enlarged abdominal or pelvic lymph nodes. Reproductive: Prostate is unremarkable. Musculoskeletal: Postsurgical changes are noted in the lower lumbar spine. No acute bony abnormality is noted. IMPRESSION: 1. Changes of internal hernia within the right mid abdomen with multiple loops of small bowel within. Some venous engorgement is identified although no  obstruction of the  herniated loops of bowel is noted. There is some more proximal dilatation of the small bowel secondary to the transition zone from the hernia. 2. Mild ascites.  This is likely reactive in nature 3. Chronic changes as described above. Electronically Signed   By: Inez Catalina M.D.   On: 05/10/2017 11:30   Dg Chest Port 1 View  Result Date: 05/10/2017 CLINICAL DATA:  Rule out pneumonia.  Tachycardia, short of breath EXAM: PORTABLE CHEST 1 VIEW COMPARISON:  08/26/2015 FINDINGS: Elevated right hemidiaphragm with right lower lobe atelectasis. Left lung clear. Negative for heart failure IMPRESSION: Right lower lobe atelectasis. Electronically Signed   By: Franchot Gallo M.D.   On: 05/10/2017 11:40      Scheduled Meds: . fluticasone  2 spray Each Nare Daily  . heparin  5,000 Units Subcutaneous Q8H  . latanoprost  1 drop Both Eyes QHS  . pantoprazole (PROTONIX) IV  40 mg Intravenous Q12H  . sodium chloride flush  3 mL Intravenous Q12H   Continuous Infusions: . sodium chloride 10 mL/hr at 05/12/17 0847  . sodium chloride    . piperacillin-tazobactam (ZOSYN)  IV Stopped (05/12/17 0846)     LOS: 2 days    Time spent: 30 minutes   Dessa Phi, DO [facility] [email or web address] Password [email or web address] 05/12/2017, 11:04 AM

## 2017-05-12 NOTE — Progress Notes (Signed)
  Echocardiogram 2D Echocardiogram has been performed.  Darlina Sicilian M 05/12/2017, 11:17 AM

## 2017-05-13 ENCOUNTER — Encounter (HOSPITAL_COMMUNITY): Payer: Self-pay

## 2017-05-13 ENCOUNTER — Ambulatory Visit: Payer: Medicare Other | Admitting: Internal Medicine

## 2017-05-13 DIAGNOSIS — L8990 Pressure ulcer of unspecified site, unspecified stage: Secondary | ICD-10-CM | POA: Insufficient documentation

## 2017-05-13 LAB — BASIC METABOLIC PANEL
Anion gap: 6 (ref 5–15)
BUN: 15 mg/dL (ref 6–20)
CO2: 25 mmol/L (ref 22–32)
Calcium: 8.8 mg/dL — ABNORMAL LOW (ref 8.9–10.3)
Chloride: 109 mmol/L (ref 101–111)
Creatinine, Ser: 0.84 mg/dL (ref 0.61–1.24)
GFR calc Af Amer: >60 mL/min (ref >60)
GFR calc non Af Amer: >60 mL/min (ref >60)
Glucose, Bld: 113 mg/dL — ABNORMAL HIGH (ref 65–99)
Potassium: 3.6 mmol/L (ref 3.5–5.1)
Sodium: 140 mmol/L (ref 135–145)

## 2017-05-13 LAB — CBC
HCT: 33.9 % — ABNORMAL LOW (ref 39.0–52.0)
Hemoglobin: 11 g/dL — ABNORMAL LOW (ref 13.0–17.0)
MCH: 29.2 pg (ref 26.0–34.0)
MCHC: 32.4 g/dL (ref 30.0–36.0)
MCV: 89.9 fL (ref 78.0–100.0)
Platelets: 238 10*3/uL (ref 150–400)
RBC: 3.77 MIL/uL — ABNORMAL LOW (ref 4.22–5.81)
RDW: 16.4 % — ABNORMAL HIGH (ref 11.5–15.5)
WBC: 8.1 10*3/uL (ref 4.0–10.5)

## 2017-05-13 LAB — GLUCOSE, CAPILLARY: Glucose-Capillary: 136 mg/dL — ABNORMAL HIGH (ref 65–99)

## 2017-05-13 MED ORDER — ACETAMINOPHEN 325 MG PO TABS
650.0000 mg | ORAL_TABLET | Freq: Four times a day (QID) | ORAL | Status: DC
  Administered 2017-05-13 – 2017-05-15 (×11): 650 mg via ORAL
  Filled 2017-05-13 (×11): qty 2

## 2017-05-13 MED ORDER — HYDROCORTISONE 1 % EX CREA
TOPICAL_CREAM | Freq: Three times a day (TID) | CUTANEOUS | Status: DC
  Administered 2017-05-13 – 2017-05-16 (×8): via TOPICAL
  Filled 2017-05-13 (×2): qty 28

## 2017-05-13 MED ORDER — TRAMADOL HCL 50 MG PO TABS
50.0000 mg | ORAL_TABLET | Freq: Four times a day (QID) | ORAL | Status: DC | PRN
  Administered 2017-05-13 – 2017-05-16 (×6): 50 mg via ORAL
  Filled 2017-05-13 (×7): qty 1

## 2017-05-13 MED ORDER — POLYETHYLENE GLYCOL 3350 17 G PO PACK
17.0000 g | PACK | Freq: Every day | ORAL | Status: DC
  Administered 2017-05-13 – 2017-05-16 (×2): 17 g via ORAL
  Filled 2017-05-13 (×3): qty 1

## 2017-05-13 MED ORDER — CHLORHEXIDINE GLUCONATE CLOTH 2 % EX PADS
6.0000 | MEDICATED_PAD | Freq: Every day | CUTANEOUS | Status: DC
  Administered 2017-05-13 – 2017-05-15 (×2): 6 via TOPICAL

## 2017-05-13 MED ORDER — DILTIAZEM HCL 25 MG/5ML IV SOLN
10.0000 mg | Freq: Once | INTRAVENOUS | Status: AC
  Administered 2017-05-13: 10 mg via INTRAVENOUS
  Filled 2017-05-13: qty 5

## 2017-05-13 MED ORDER — IRBESARTAN 300 MG PO TABS
300.0000 mg | ORAL_TABLET | Freq: Every day | ORAL | Status: DC
  Administered 2017-05-13 – 2017-05-16 (×4): 300 mg via ORAL
  Filled 2017-05-13 (×4): qty 1

## 2017-05-13 MED ORDER — METOPROLOL TARTRATE 5 MG/5ML IV SOLN
5.0000 mg | Freq: Once | INTRAVENOUS | Status: AC
  Administered 2017-05-13: 5 mg via INTRAVENOUS
  Filled 2017-05-13: qty 5

## 2017-05-13 MED ORDER — LABETALOL HCL 5 MG/ML IV SOLN
10.0000 mg | INTRAVENOUS | Status: DC | PRN
  Administered 2017-05-14: 10 mg via INTRAVENOUS
  Filled 2017-05-13 (×3): qty 4

## 2017-05-13 MED ORDER — MUPIROCIN 2 % EX OINT
1.0000 "application " | TOPICAL_OINTMENT | Freq: Two times a day (BID) | CUTANEOUS | Status: DC
  Administered 2017-05-13 – 2017-05-16 (×6): 1 via NASAL
  Filled 2017-05-13 (×2): qty 22

## 2017-05-13 NOTE — Evaluation (Signed)
Physical Therapy Evaluation Patient Details Name: Cody Frazier MRN: [id number] DOB: November 28, 1945 Today's Date: 05/13/2017   History of Present Illness  Cody Frazier is a 71 y.o. male with medical history significant of HTN, paroxysmal A. fib, PUD, GERD;  who presented to ED 05/10/17  with complaints of right-sided abdominal pain with nausea and vomiting.  Noted seizure-like episode in Ed, found to be in Afib w/ RVR. S/P laparotomy 05/10/17 for lysis of adhesions and reduction of twisted Small bowel.  Clinical Impression  The patient is very motivated to ambulate which he did x 150' with RW. HR max 123, BP 155/95. O2 sats 95% on RA.RN aware of VS. The patient should progress to return to Prior  Level without AD. Pt admitted with above diagnosis. Pt currently with functional limitations due to the deficits listed below (see PT Problem List). Pt will benefit from skilled PT to increase their independence and safety with mobility to allow discharge to the venue listed below.       Follow Up Recommendations No PT follow up    Equipment Recommendations  None recommended by PT    Recommendations for Other Services       Precautions / Restrictions Precautions Precaution Comments: monitor HR/sats/BP Restrictions Weight Bearing Restrictions: No      Mobility  Bed Mobility Overal bed mobility: Modified Independent             General bed mobility comments: use of rails  Transfers Overall transfer level: Needs assistance Equipment used: Rolling walker (2 wheeled) Transfers: Sit to/from Stand;Stand Pivot Transfers Sit to Stand: Min guard Stand pivot transfers: Min guard       General transfer comment: no UE support to pivot  Ambulation/Gait Ambulation/Gait assistance: Min guard Ambulation Distance (Feet): 150 Feet Assistive device: Rolling walker (2 wheeled) Gait Pattern/deviations: Step-through pattern   Gait velocity interpretation: at or above normal speed for  age/gender General Gait Details: gait is steady and smoothe.  Stairs            Wheelchair Mobility    Modified Rankin (Stroke Patients Only)       Balance                                             Pertinent Vitals/Pain Pain Assessment: No/denies pain    Home Living Family/patient expects to be discharged to:: Private residence Living Arrangements: Spouse/significant other Available Help at Discharge: Family Type of Home: House Home Access: Stairs to enter   Technical brewer of Steps: 1 Home Layout: Two level;Able to live on main level with bedroom/bathroom Home Equipment: Gilford Rile - 2 wheels      Prior Function Level of Independence: Independent               Hand Dominance        Extremity/Trunk Assessment   Upper Extremity Assessment Upper Extremity Assessment: Overall WFL for tasks assessed    Lower Extremity Assessment Lower Extremity Assessment: Overall WFL for tasks assessed    Cervical / Trunk Assessment Cervical / Trunk Assessment: Normal  Communication   Communication: No difficulties  Cognition Arousal/Alertness: Awake/alert Behavior During Therapy: WFL for tasks assessed/performed Overall Cognitive Status: Within Functional Limits for tasks assessed  General Comments      Exercises     Assessment/Plan    PT Assessment Patient needs continued PT services  PT Problem List Decreased activity tolerance;Decreased mobility;Decreased knowledge of precautions       PT Treatment Interventions DME instruction;Gait training;Functional mobility training;Therapeutic activities;Patient/family education    PT Goals (Current goals can be found in the Care Plan section)  Acute Rehab PT Goals Patient Stated Goal: to go home tomorrow PT Goal Formulation: With patient/family Time For Goal Achievement: 05/27/17 Potential to Achieve Goals: Good    Frequency  Min 3X/week   Barriers to discharge        Co-evaluation               AM-PAC PT "6 Clicks" Daily Activity  Outcome Measure Difficulty turning over in bed (including adjusting bedclothes, sheets and blankets)?: A Little Difficulty moving from lying on back to sitting on the side of the bed? : A Little Difficulty sitting down on and standing up from a chair with arms (e.g., wheelchair, bedside commode, etc,.)?: A Lot Help needed moving to and from a bed to chair (including a wheelchair)?: A Lot Help needed walking in hospital room?: A Lot Help needed climbing 3-5 steps with a railing? : Total 6 Click Score: 13    End of Session   Activity Tolerance: Patient tolerated treatment well Patient left: in bed;with call bell/phone within reach Nurse Communication: Mobility status PT Visit Diagnosis: Difficulty in walking, not elsewhere classified (R26.2)    Time: 4259-5638 PT Time Calculation (min) (ACUTE ONLY): 44 min   Charges:   PT Evaluation $PT Eval Moderate Complexity: 1 Procedure PT Treatments $Gait Training: 23-37 mins   PT G CodesTresa Endo PT [phone]   Claretha Cooper 05/13/2017, 10:58 AM

## 2017-05-13 NOTE — Progress Notes (Signed)
[facility] Progress Note  3 Days Post-Op  Subjective: CC:  Complaining of 6-7/10 abdominal pain around his incision requiring IV pain medication. Tolerating clears without nausea or vomiting. + flatus. Last BM 2 days ago.   Patient  denies a known reaction to oxycodone or hydrocodone, though chart lists "swelling" with administration of codeine phosphate.    Afebrile, VSS Objective: Vital signs in last 24 hours: Temp:  [97.6 F (36.4 C)-98.1 F (36.7 C)] 97.9 F (36.6 C) (07/02 0306) Pulse Rate:  [72-96] 96 (07/02 0700) Resp:  [15-27] 19 (07/02 0700) BP: (106-184)/(52-89) 184/89 (07/02 0700) SpO2:  [91 %-97 %] 97 % (07/02 0700) Last BM Date:  (prior to admission)  Intake/Output from previous day: 07/01 0701 - 07/02 0700 In: 920.5 [P.O.:120; I.V.:600.5; IV Piggyback:200] Out: 1460 [Urine:1460] Intake/Output this shift: Total I/O In: 183.3 [I.V.:183.3] Out: 700 [Urine:700]  PE: Gen:  Alert, NAD, pleasant Card:  Regular rate and rhythm, pedal pulses 2+ BL Pulm:  Normal effort, clear to auscultation bilaterally Abd: Soft, non-tender, mild distention, bowel sounds present,  incisions C/D/I w/ honeycomb in place- will plan to remove dressing tomorrow.  Skin: warm and dry, no rashes  Psych: A&Ox3   Lab Results:   Recent Labs  05/12/17 1033 05/13/17 0349  WBC 9.3 8.1  HGB 10.9* 11.0*  HCT 33.0* 33.9*  PLT 212 238   BMET  Recent Labs  05/12/17 1033 05/13/17 0349  NA 142 140  K 4.0 3.6  CL 114* 109  CO2 24 25  GLUCOSE 128* 113*  BUN 22* 15  CREATININE 0.92 0.84  CALCIUM 8.6* 8.8*   PT/INR  Recent Labs  05/10/17 0920  LABPROT 14.3  INR 1.11   CMP     Component Value Date/Time   NA 140 05/13/2017 0349   K 3.6 05/13/2017 0349   CL 109 05/13/2017 0349   CO2 25 05/13/2017 0349   GLUCOSE 113 (H) 05/13/2017 0349   GLUCOSE 107 (H) 09/13/2006 0957   BUN 15 05/13/2017 0349   CREATININE 0.84 05/13/2017 0349   CALCIUM 8.8 (L) 05/13/2017  0349   PROT 7.9 05/10/2017 0912   ALBUMIN 4.2 05/10/2017 0912   AST 47 (H) 05/10/2017 0912   ALT 21 05/10/2017 0912   ALKPHOS 113 05/10/2017 0912   BILITOT 0.6 05/10/2017 0912   GFRNONAA >60 05/13/2017 0349   GFRAA >60 05/13/2017 0349   Lipase     Component Value Date/Time   LIPASE 19 05/10/2017 0912       Studies/Results: No results found.  Anti-infectives: Anti-infectives    Start     Dose/Rate Route Frequency Ordered Stop   05/10/17 2000  piperacillin-tazobactam (ZOSYN) IVPB 3.375 g     3.375 g 12.5 mL/hr over 240 Minutes Intravenous Every 8 hours 05/10/17 1106     05/10/17 1045  piperacillin-tazobactam (ZOSYN) IVPB 3.375 g     3.375 g 100 mL/hr over 30 Minutes Intravenous  Once 05/10/17 1043 05/10/17 1249       Assessment/Plan Internal hernia POD#3 S/P EXPLORATORY LAPAROTOMY WITH ENTEROLYSIS 05/10/17 Dr. Johnathan Hausen  - afebrile, vitals stable, hgb/hct stable - advance diet to full liquids, add Miralax  - Incentive spirometer - mobilize as tolerated and encourage a walk in the hallway - pain: schedule tylenol 650 mg PRN, add PRN ULTRAM for increased PO pain control, IV fentanyl for breakthrough. - ok to transfer to the floor from surgical perspective   HTN A.fib - not anticoagulated on admission, went into RVR 6/30  and now off of Cardizem drip; F/U outpatient with Dr. Percival Spanish.  GERD - PPI  FEN - clear liquids; electrolytes WNL ID - Leukocytosis resolved; Zosyn 6/29 >>; Ok to d/c abx from surgical perspective  VTE: SQ heparin, SCD's   LOS: 3 days    Jill Alexanders , [facility] Surgery 05/13/2017, 8:04 AM Pager: [phone] Consults: [phone] Mon-Fri 7:00 am-4:30 pm Sat-Sun 7:00 am-11:30 am

## 2017-05-13 NOTE — Progress Notes (Signed)
Pt hypertensive with SBP in the low 170s. Dr. Hal Hope notified. Did not want to treat until SBP > 180. Will continue to monitor.

## 2017-05-13 NOTE — Progress Notes (Signed)
PROGRESS NOTE    BARNABY RIPPEON  GMW:[id number] DOB: 05/27/1946 DOA: 05/10/2017 PCP: Cassandria Anger, MD     Brief Narrative:  Cody Frazier is a 71 y.o. male with medical history significant of HTN, paroxysmal A. fib, PUD, GERD;  who presented with complaints of right-sided abdominal pain with nausea and vomiting Wednesday night (6/27). Symptoms started after he ate food that had been sitting out for over 3 hours. Patient was awoken out of sleep due to symptoms. Pain is constant and sharp. He's had multiple episodes of emesis which were noted to be nonbloody. He reports that he still able to pass flatus and has been belching a lot, but his last bowel movement was 2 days ago. While waiting in the waiting room in the ED, patient had what was reported to be a seizure-like episode where his eyes rolled up and he had a blank stare and was diaphoretic for which he was taken back immediately for further evaluation. On initial EKG patient was noted to be in A. fib with RVR 187 bpm, but heart rate improved with IV fluids. CT scan of the abdomen showed signs of possible internal hernia. Gen. surgery was consulted. Patient underwent exploratory laparotomy with enteral lysis of adhesions 6/29.   Assessment & Plan:   Principal Problem:   Internal hernia Active Problems:   Essential hypertension   Severe sepsis (HCC)   Dehydration   AKI (acute kidney injury) ([place])   Nausea and vomiting   Pressure injury of skin   Internal abdominal hernia -S/p Laparotomy with extensive enteral lysis 6/29  -Per general surgery  -Stop zosyn today   A. fib with RVR -Chadsvasc=2. Patient previously noted of going into A. fib with RVR postop. Has seen cardiology (Dr. Percival Spanish) after last episode after back surgery and was not started on anticoagulation. Went into A Fib RVR post-operatively with ambulation to bedside commode, treated with IV cardizem drip, converted to normal sinus soon after.  -Now normal sinus  rhythm, continue to monitor  -Echo completed, EF 44%, grade 1 diastolic dysfunction, mild LVH -Will need to follow up with cardiology   Essential hypertension -Resume ARB   Acute kidney injury secondary to severe dehydration -Patient's baseline creatinine was previously 1.1- 1.17 -Resolved with IVF   GERD -Protonix    DVT prophylaxis: subq hep Code Status: full Family Communication: no family at bedside  Disposition Plan: Plan to transfer to tele as long as patient able to tolerate activity with PT without converting back into A Fib RVR   Consultants:   General Surgery  Procedures:  Laparotomy with extensive enteral lysis 6/29   Antimicrobials:  Anti-infectives    Start     Dose/Rate Route Frequency Ordered Stop   05/10/17 2000  piperacillin-tazobactam (ZOSYN) IVPB 3.375 g  Status:  Discontinued     3.375 g 12.5 mL/hr over 240 Minutes Intravenous Every 8 hours 05/10/17 1106 05/13/17 0904   05/10/17 1045  piperacillin-tazobactam (ZOSYN) IVPB 3.375 g     3.375 g 100 mL/hr over 30 Minutes Intravenous  Once 05/10/17 1043 05/10/17 1249       Subjective: Feeling well this morning. Tolerating liquid diet. Passing gas, BM. Some abdominal pain still.   Objective: Vitals:   05/13/17 0306 05/13/17 0500 05/13/17 0600 05/13/17 0700  BP:  (!) 170/86 (!) 160/85 (!) 184/89  Pulse:  86 83 96  Resp:  (!) 21 18 19   Temp: 97.9 F (36.6 C)     TempSrc: Oral  SpO2:  97% 97% 97%  Weight:      Height:        Intake/Output Summary (Last 24 hours) at 05/13/17 0904 Last data filed at 05/13/17 0720  Gross per 24 hour  Intake           573.33 ml  Output             1985 ml  Net         -1411.67 ml   Filed Weights   05/10/17 0846  Weight: 97.5 kg (215 lb)    Examination:  General exam: Appears calm and comfortable  Respiratory system: Clear to auscultation. Respiratory effort normal. Cardiovascular system: S1 & S2 heard, tachycardic rate 100s, regular rhythm. No JVD,  murmurs, rubs, gallops or clicks. No pedal edema. Gastrointestinal system: Abdomen is nondistended, soft and TTP Right side, +incision with dressing dry and clean . No organomegaly or masses felt. Central nervous system: Alert and oriented. No focal neurological deficits. Extremities: Symmetric  Skin: No rashes, lesions or ulcers on exposed skin  Psychiatry: Judgement and insight appear normal. Mood & affect appropriate.   Data Reviewed: I have personally reviewed following labs and imaging studies  CBC:  Recent Labs Lab 05/10/17 0912  05/10/17 1243 05/10/17 1556 05/11/17 0728 05/12/17 1033 05/13/17 0349  WBC 24.2*  --   --  21.9* 13.2* 9.3 8.1  NEUTROABS 21.7*  --   --   --   --   --   --   HGB 17.8*  < > 16.0 15.5 12.0* 10.9* 11.0*  HCT 52.3*  < > 47.0 44.5 36.5* 33.0* 33.9*  MCV 88.2  --   --  85.4 87.5 89.2 89.9  PLT 347  --   --  274 209 212 238  < > = values in this interval not displayed. Basic Metabolic Panel:  Recent Labs Lab 05/10/17 0912 05/10/17 0950 05/10/17 1008 05/10/17 1243 05/11/17 0728 05/12/17 1033 05/13/17 0349  NA 140  --  140 140 143 142 140  K 3.6  --  3.7 4.4 4.0 4.0 3.6  CL 102  --  111 109 113* 114* 109  CO2 14*  --   --   --  22 24 25   GLUCOSE 380*  --  316* 144* 168* 128* 113*  BUN 17  --  21* 22* 24* 22* 15  CREATININE 1.88*  --  1.40* 1.50* 1.19 0.92 0.84  CALCIUM 10.4*  --   --   --  8.4* 8.6* 8.8*  MG  --  2.3  --   --   --   --   --    GFR: Estimated Creatinine Clearance: 88.2 mL/min (by C-G formula based on SCr of 0.84 mg/dL). Liver Function Tests:  Recent Labs Lab 05/10/17 0912  AST 47*  ALT 21  ALKPHOS 113  BILITOT 0.6  PROT 7.9  ALBUMIN 4.2    Recent Labs Lab 05/10/17 0912  LIPASE 19   No results for input(s): AMMONIA in the last 168 hours. Coagulation Profile:  Recent Labs Lab 05/10/17 0920  INR 1.11   Cardiac Enzymes:  Recent Labs Lab 05/10/17 0920  TROPONINI <0.03   BNP (last 3 results) No  results for input(s): PROBNP in the last 8760 hours. HbA1C: No results for input(s): HGBA1C in the last 72 hours. CBG: No results for input(s): GLUCAP in the last 168 hours. Lipid Profile: No results for input(s): CHOL, HDL, LDLCALC, TRIG, CHOLHDL, LDLDIRECT in the last 72  hours. Thyroid Function Tests:  Recent Labs  05/10/17 0920  TSH 1.401   Anemia Panel: No results for input(s): VITAMINB12, FOLATE, FERRITIN, TIBC, IRON, RETICCTPCT in the last 72 hours. Sepsis Labs:  Recent Labs Lab 05/10/17 1000 05/10/17 1244 05/11/17 0728  LATICACIDVEN 7.04* 3.60* 1.5    Recent Results (from the past 240 hour(s))  Blood Culture (routine x 2)     Status: None (Preliminary result)   Collection Time: 05/10/17 10:43 AM  Result Value Ref Range Status   Specimen Description BLOOD BLOOD RIGHT HAND  Final   Special Requests IN PEDIATRIC BOTTLE Blood Culture adequate volume  Final   Culture   Final    NO GROWTH 2 DAYS Performed at [facility], [place] [address] [place], [place] [address]    Report Status PENDING  Incomplete  Blood Culture (routine x 2)     Status: None (Preliminary result)   Collection Time: 05/10/17 12:00 PM  Result Value Ref Range Status   Specimen Description BLOOD BLOOD LEFT HAND  Final   Special Requests   Final    BOTTLES DRAWN AEROBIC AND ANAEROBIC Blood Culture adequate volume   Culture   Final    NO GROWTH 2 DAYS Performed at [facility], [place] [address], [place], [place] [address]    Report Status PENDING  Incomplete  Surgical PCR screen     Status: Abnormal   Collection Time: 05/10/17  4:17 PM  Result Value Ref Range Status   MRSA, PCR NEGATIVE NEGATIVE Final   Staphylococcus aureus POSITIVE (A) NEGATIVE Final    Comment:        The Xpert SA Assay (FDA approved for NASAL specimens in patients over 66 years of age), is one component of a comprehensive surveillance program.  Test performance has been validated by [facility] for patients  greater than or equal to 58 year old. It is not intended to diagnose infection nor to guide or monitor treatment.        Radiology Studies: No results found.    Scheduled Meds: . acetaminophen  650 mg Oral Q6H  . fluticasone  2 spray Each Nare Daily  . heparin  5,000 Units Subcutaneous Q8H  . irbesartan  300 mg Oral Daily  . latanoprost  1 drop Both Eyes QHS  . pantoprazole (PROTONIX) IV  40 mg Intravenous Q12H  . polyethylene glycol  17 g Oral Daily  . sodium chloride flush  3 mL Intravenous Q12H   Continuous Infusions: . sodium chloride 10 mL/hr at 05/13/17 0720     LOS: 3 days    Time spent: 30 minutes   Dessa Phi, DO [facility] [email or web address] Password [email or web address] 05/13/2017, 9:04 AM

## 2017-05-14 DIAGNOSIS — E860 Dehydration: Secondary | ICD-10-CM

## 2017-05-14 LAB — BPAM RBC
Blood Product Expiration Date: 201807262359
Blood Product Expiration Date: 201807262359
Unit Type and Rh: 5100
Unit Type and Rh: 5100

## 2017-05-14 LAB — CBC
HCT: 35.4 % — ABNORMAL LOW (ref 39.0–52.0)
Hemoglobin: 11.9 g/dL — ABNORMAL LOW (ref 13.0–17.0)
MCH: 28.7 pg (ref 26.0–34.0)
MCHC: 33.6 g/dL (ref 30.0–36.0)
MCV: 85.5 fL (ref 78.0–100.0)
Platelets: 264 10*3/uL (ref 150–400)
RBC: 4.14 MIL/uL — ABNORMAL LOW (ref 4.22–5.81)
RDW: 15.2 % (ref 11.5–15.5)
WBC: 6.8 10*3/uL (ref 4.0–10.5)

## 2017-05-14 LAB — BASIC METABOLIC PANEL
Anion gap: 8 (ref 5–15)
BUN: 8 mg/dL (ref 6–20)
CO2: 29 mmol/L (ref 22–32)
Calcium: 9.2 mg/dL (ref 8.9–10.3)
Chloride: 105 mmol/L (ref 101–111)
Creatinine, Ser: 0.72 mg/dL (ref 0.61–1.24)
GFR calc Af Amer: >60 mL/min (ref >60)
GFR calc non Af Amer: >60 mL/min (ref >60)
Glucose, Bld: 109 mg/dL — ABNORMAL HIGH (ref 65–99)
Potassium: 3.2 mmol/L — ABNORMAL LOW (ref 3.5–5.1)
Sodium: 142 mmol/L (ref 135–145)

## 2017-05-14 LAB — TYPE AND SCREEN
ABO/RH(D): O POS
Antibody Screen: NEGATIVE
Unit division: 0
Unit division: 0

## 2017-05-14 MED ORDER — PANTOPRAZOLE SODIUM 40 MG PO TBEC
40.0000 mg | DELAYED_RELEASE_TABLET | Freq: Two times a day (BID) | ORAL | Status: DC
  Administered 2017-05-14 – 2017-05-16 (×4): 40 mg via ORAL
  Filled 2017-05-14 (×4): qty 1

## 2017-05-14 MED ORDER — SODIUM CHLORIDE 0.9 % IV BOLUS (SEPSIS)
500.0000 mL | Freq: Once | INTRAVENOUS | Status: AC
  Administered 2017-05-14: 500 mL via INTRAVENOUS

## 2017-05-14 MED ORDER — VITAMINS A & D EX OINT
TOPICAL_OINTMENT | CUTANEOUS | Status: AC
  Administered 2017-05-14: 05:00:00
  Filled 2017-05-14: qty 5

## 2017-05-14 MED ORDER — DILTIAZEM HCL 25 MG/5ML IV SOLN
10.0000 mg | Freq: Once | INTRAVENOUS | Status: AC
  Administered 2017-05-14: 10 mg via INTRAVENOUS
  Filled 2017-05-14: qty 5

## 2017-05-14 MED ORDER — DILTIAZEM HCL 30 MG PO TABS
30.0000 mg | ORAL_TABLET | Freq: Four times a day (QID) | ORAL | Status: DC
  Administered 2017-05-14 – 2017-05-16 (×8): 30 mg via ORAL
  Filled 2017-05-14 (×8): qty 1

## 2017-05-14 MED ORDER — POTASSIUM CHLORIDE CRYS ER 20 MEQ PO TBCR
20.0000 meq | EXTENDED_RELEASE_TABLET | Freq: Two times a day (BID) | ORAL | Status: AC
  Administered 2017-05-14 (×2): 20 meq via ORAL
  Filled 2017-05-14 (×2): qty 1

## 2017-05-14 NOTE — Progress Notes (Signed)
PROGRESS NOTE    Cody Frazier  XNA:[id number] DOB: July 23, 1946 DOA: 05/10/2017 PCP: Cassandria Anger, MD    Brief Narrative: 71 year old male with prior h/o hypertension, paroxysmal afib, GERD, PUD, presented with with right abd pain, with nausea and vomiting since 6/27. CT abd showed internal hernia , small bowels within the hernia and venous engorgement. gen surgery consulted and she underwent s/p lap with extensive enteral lysis on 6/29. He was started on zosyn empirically since admission and discontinued it on 7/2. Started on clears and advance as tolerated.   Assessment & Plan:   Principal Problem:   Internal hernia Active Problems:   Essential hypertension   Severe sepsis (HCC)   Dehydration   AKI (acute kidney injury) ([place])   Nausea and vomiting   Pressure injury of skin   Internal hernia:  Laparotomy with extensive enteral lysis 6/29  No new complaints.    afib with RVR:  As per the patient, its paroxysmal, and he sees Dr Percival Spanish in the office.  Had a few episodes of afib with RVR post op and again this afternoon.  Echo on 7/1 shows good LVEF and diastolic dysfunction.  Started him on low dose Cardizem and call cardiology will see the patient in am.  TSh within normal limits.  [place] VASC2 score is 3, in this patient, question for anti coagulation.    Acute kidney injury: secondary to pre renal azotemia. Resolved with hydration.    GERD:  On PPI., might hold it if he continues to have watery diarrhea.    Hypertension:  Well controlled. Resume home meds.   Loose BM last night.  None this am.  Monitor.    Hypokalemia:  Replaced  Anemia: normocytic. And at baseline.    DVT prophylaxis: sq heparin Code Status: full code.  Family Communication: wife at bedside,discussed the plan of care with the patient and wife.  Disposition Plan: pending cardiology consult, and home if able to tolerate regular diet.   Consultants:   General surgery.     Procedures: Laparotomy with extensive enteral lysis 6/29    Antimicrobials: zosyn since admission till 7/2   Subjective: No chest pain or sob, no palpitations. No nausea or vomiting  No abd pain.    Objective: Vitals:   05/13/17 2351 05/14/17 0435 05/14/17 1216 05/14/17 1438  BP: (!) 139/105 (!) 160/94 (!) 148/95 134/86  Pulse: 93 88 98 84  Resp:  16  18  Temp:  97.4 F (36.3 C)  98.4 F (36.9 C)  TempSrc:  Oral  Oral  SpO2: 94% 95%  93%  Weight:      Height:        Intake/Output Summary (Last 24 hours) at 05/14/17 1603 Last data filed at 05/14/17 1401  Gross per 24 hour  Intake              760 ml  Output             2875 ml  Net            -2115 ml   Filed Weights   05/10/17 0846  Weight: 97.5 kg (215 lb)    Examination:  General exam: Appears calm and comfortable  Respiratory system: Clear to auscultation. Respiratory effort normal. Cardiovascular system: S1 & S2 heard, irregular,  No JVD, murmurs, rubs, gallops or clicks. No pedal edema. Gastrointestinal system: Abdomen is nondistended, soft and nontender. No organomegaly or masses felt. Normal bowel sounds heard. Central nervous system:  Alert and oriented. No focal neurological deficits. Extremities: Symmetric 5 x 5 power. Skin: No rashes, lesions or ulcers Psychiatry: Judgement and insight appear normal. Mood & affect appropriate.     Data Reviewed: I have personally reviewed following labs and imaging studies  CBC:  Recent Labs Lab 05/10/17 0912  05/10/17 1556 05/11/17 0728 05/12/17 1033 05/13/17 0349 05/14/17 0606  WBC 24.2*  --  21.9* 13.2* 9.3 8.1 6.8  NEUTROABS 21.7*  --   --   --   --   --   --   HGB 17.8*  < > 15.5 12.0* 10.9* 11.0* 11.9*  HCT 52.3*  < > 44.5 36.5* 33.0* 33.9* 35.4*  MCV 88.2  --  85.4 87.5 89.2 89.9 85.5  PLT 347  --  274 209 212 238 264  < > = values in this interval not displayed. Basic Metabolic Panel:  Recent Labs Lab 05/10/17 0912 05/10/17 0950   05/10/17 1243 05/11/17 0728 05/12/17 1033 05/13/17 0349 05/14/17 0606  NA 140  --   < > 140 143 142 140 142  K 3.6  --   < > 4.4 4.0 4.0 3.6 3.2*  CL 102  --   < > 109 113* 114* 109 105  CO2 14*  --   --   --  22 24 25 29   GLUCOSE 380*  --   < > 144* 168* 128* 113* 109*  BUN 17  --   < > 22* 24* 22* 15 8  CREATININE 1.88*  --   < > 1.50* 1.19 0.92 0.84 0.72  CALCIUM 10.4*  --   --   --  8.4* 8.6* 8.8* 9.2  MG  --  2.3  --   --   --   --   --   --   < > = values in this interval not displayed. GFR: Estimated Creatinine Clearance: 92.6 mL/min (by C-G formula based on SCr of 0.72 mg/dL). Liver Function Tests:  Recent Labs Lab 05/10/17 0912  AST 47*  ALT 21  ALKPHOS 113  BILITOT 0.6  PROT 7.9  ALBUMIN 4.2    Recent Labs Lab 05/10/17 0912  LIPASE 19   No results for input(s): AMMONIA in the last 168 hours. Coagulation Profile:  Recent Labs Lab 05/10/17 0920  INR 1.11   Cardiac Enzymes:  Recent Labs Lab 05/10/17 0920  TROPONINI <0.03   BNP (last 3 results) No results for input(s): PROBNP in the last 8760 hours. HbA1C: No results for input(s): HGBA1C in the last 72 hours. CBG:  Recent Labs Lab 05/11/17 1617  GLUCAP 136*   Lipid Profile: No results for input(s): CHOL, HDL, LDLCALC, TRIG, CHOLHDL, LDLDIRECT in the last 72 hours. Thyroid Function Tests: No results for input(s): TSH, T4TOTAL, FREET4, T3FREE, THYROIDAB in the last 72 hours. Anemia Panel: No results for input(s): VITAMINB12, FOLATE, FERRITIN, TIBC, IRON, RETICCTPCT in the last 72 hours. Sepsis Labs:  Recent Labs Lab 05/10/17 1000 05/10/17 1244 05/11/17 0728  LATICACIDVEN 7.04* 3.60* 1.5    Recent Results (from the past 240 hour(s))  Blood Culture (routine x 2)     Status: None (Preliminary result)   Collection Time: 05/10/17 10:43 AM  Result Value Ref Range Status   Specimen Description BLOOD BLOOD RIGHT HAND  Final   Special Requests IN PEDIATRIC BOTTLE Blood Culture adequate  volume  Final   Culture   Final    NO GROWTH 4 DAYS Performed at [facility], [place]  [address] [place], [place] [address]    Report Status PENDING  Incomplete  Blood Culture (routine x 2)     Status: None (Preliminary result)   Collection Time: 05/10/17 12:00 PM  Result Value Ref Range Status   Specimen Description BLOOD BLOOD LEFT HAND  Final   Special Requests   Final    BOTTLES DRAWN AEROBIC AND ANAEROBIC Blood Culture adequate volume   Culture   Final    NO GROWTH 4 DAYS Performed at [facility], [place] [address] [place], [place] [address]    Report Status PENDING  Incomplete  Surgical PCR screen     Status: Abnormal   Collection Time: 05/10/17  4:17 PM  Result Value Ref Range Status   MRSA, PCR NEGATIVE NEGATIVE Final   Staphylococcus aureus POSITIVE (A) NEGATIVE Final    Comment:        The Xpert SA Assay (FDA approved for NASAL specimens in patients over 75 years of age), is one component of a comprehensive surveillance program.  Test performance has been validated by [facility] for patients greater than or equal to 49 year old. It is not intended to diagnose infection nor to guide or monitor treatment.          Radiology Studies: No results found.      Scheduled Meds: . acetaminophen  650 mg Oral Q6H  . Chlorhexidine Gluconate Cloth  6 each Topical Daily  . diltiazem  30 mg Oral Q6H  . fluticasone  2 spray Each Nare Daily  . heparin  5,000 Units Subcutaneous Q8H  . hydrocortisone cream   Topical TID  . irbesartan  300 mg Oral Daily  . latanoprost  1 drop Both Eyes QHS  . mupirocin ointment  1 application Nasal BID  . pantoprazole  40 mg Oral BID AC  . polyethylene glycol  17 g Oral Daily  . potassium chloride  20 mEq Oral BID  . sodium chloride flush  3 mL Intravenous Q12H   Continuous Infusions: . sodium chloride 10 mL/hr at 05/13/17 1100     LOS: 4 days    Time spent: 35 minutes.     Hosie Poisson, MD [facility] Pager [phone]   If 7PM-7AM, please contact night-coverage [email or web address] Password [email or web address] 05/14/2017, 4:03 PM

## 2017-05-14 NOTE — Progress Notes (Signed)
Physical Therapy Treatment Patient Details Name: Cody Frazier MRN: [id number] DOB: September 14, 1946 Today's Date: 05/14/2017    History of Present Illness Cody Frazier is a 71 y.o. male with medical history significant of HTN, paroxysmal A. fib, PUD, GERD;  who presented to ED 05/10/17  with complaints of right-sided abdominal pain with nausea and vomiting.  Noted seizure-like episode in Ed, found to be in Afib w/ RVR. S/P laparotomy 05/10/17 for lysis of adhesions and reduction of twisted Small bowel.    PT Comments    Pt feeling better after resting most of morning.  Reported "bad night".  Pt able to self rise OOB.  Assisted with amb a greater distance using RW just for safety.  Believe pt will not need after acute hospital stay.  Avg HR 105.  Did c/o increased ABD pain with mobility.  Reported to RN for pain meds.    Follow Up Recommendations  No PT follow up     Equipment Recommendations  None recommended by PT    Recommendations for Other Services       Precautions / Restrictions Precautions Precautions: None Precaution Comments: monitor HR/sats/BP Restrictions Weight Bearing Restrictions: No    Mobility  Bed Mobility Overal bed mobility: Modified Independent             General bed mobility comments: able to self assist OOB and back to bed with increased time  Transfers Overall transfer level: Needs assistance Equipment used: Standard walker;None Transfers: Sit to/from Stand;Stand Pivot Transfers Sit to Stand: Supervision;Min guard Stand pivot transfers: Supervision;Min guard       General transfer comment: one VC safety with turns otherwise good use of hands to steady self  Ambulation/Gait Ambulation/Gait assistance: Min guard Ambulation Distance (Feet): 165 Feet Assistive device: Rolling walker (2 wheeled) Gait Pattern/deviations: Step-through pattern Gait velocity: WFL   General Gait Details: gait is steady and smoothe.    Avg HR 102   Amb with a RW  just for safety believe pt will not need once D/C   Stairs            Wheelchair Mobility    Modified Rankin (Stroke Patients Only)       Balance                                            Cognition Arousal/Alertness: Awake/alert Behavior During Therapy: WFL for tasks assessed/performed Overall Cognitive Status: Within Functional Limits for tasks assessed                                        Exercises      General Comments        Pertinent Vitals/Pain Pain Assessment: No/denies pain    Home Living                      Prior Function            PT Goals (current goals can now be found in the care plan section) Progress towards PT goals: Progressing toward goals    Frequency    Min 3X/week      PT Plan      Co-evaluation              AM-PAC PT "6 Clicks"  Daily Activity  Outcome Measure    Difficulty moving from lying on back to sitting on the side of the bed? : A Lot Difficulty sitting down on and standing up from a chair with arms (e.g., wheelchair, bedside commode, etc,.)?: A Lot Help needed moving to and from a bed to chair (including a wheelchair)?: A Lot Help needed walking in hospital room?: A Lot Help needed climbing 3-5 steps with a railing? : A Lot 6 Click Score: 10    End of Session Equipment Utilized During Treatment: Gait belt Activity Tolerance: Patient tolerated treatment well Patient left: in bed Nurse Communication: Patient requests pain meds (c/o 7/10 upper ABD pain at incision site) PT Visit Diagnosis: Difficulty in walking, not elsewhere classified (R26.2)     Time: 4034-7425 PT Time Calculation (min) (ACUTE ONLY): 12 min  Charges:  $Gait Training: 8-22 mins                    G Codes:       Rica Koyanagi  PTA [facility]  Acute  Rehab Pager      [phone]

## 2017-05-14 NOTE — Progress Notes (Signed)
Patient ID: Cody Frazier, male   DOB: 07/14/1946, 71 y.o.   MRN: [id number]  [facility] Surgery Progress Note  4 Days Post-Op  Subjective: CC- abdominal pain Wife at bedside, states that patient had a rough night. Went into Afib RVR again, responded well to cardizem. He feels weak and has little appetite. States that he may have overdone it with full liquids yesterday. No n/v. Has had multiple BM's since yesterday.  Objective: Vital signs in last 24 hours: Temp:  [97.4 F (36.3 C)-98.5 F (36.9 C)] 97.4 F (36.3 C) (07/03 0435) Pulse Rate:  [81-112] 88 (07/03 0435) Resp:  [16-20] 16 (07/03 0435) BP: (124-206)/(81-118) 160/94 (07/03 0435) SpO2:  [94 %-100 %] 95 % (07/03 0435) Last BM Date: 05/13/17  Intake/Output from previous day: 07/02 0701 - 07/03 0700 In: 650 [P.O.:240; I.V.:410] Out: 4000 [Urine:4000] Intake/Output this shift: No intake/output data recorded.  PE: Gen:  Alert, NAD, pleasant HEENT: EOM's intact, pupils equal  Card:  RRR, no M/G/R heard Pulm:  CTAB, no W/R/R, effort normal Abd: Soft, NT/ND, +BS, midline incision C/D/I with staples intact and honeycomb dressing in place Ext:  No erythema, edema, or tenderness BUE/BLE  Psych: A&Ox3  Skin: no rashes noted, warm and dry  Lab Results:   Recent Labs  05/13/17 0349 05/14/17 0606  WBC 8.1 6.8  HGB 11.0* 11.9*  HCT 33.9* 35.4*  PLT 238 264   BMET  Recent Labs  05/13/17 0349 05/14/17 0606  NA 140 142  K 3.6 3.2*  CL 109 105  CO2 25 29  GLUCOSE 113* 109*  BUN 15 8  CREATININE 0.84 0.72  CALCIUM 8.8* 9.2   PT/INR No results for input(s): LABPROT, INR in the last 72 hours. CMP     Component Value Date/Time   NA 142 05/14/2017 0606   K 3.2 (L) 05/14/2017 0606   CL 105 05/14/2017 0606   CO2 29 05/14/2017 0606   GLUCOSE 109 (H) 05/14/2017 0606   GLUCOSE 107 (H) 09/13/2006 0957   BUN 8 05/14/2017 0606   CREATININE 0.72 05/14/2017 0606   CALCIUM 9.2 05/14/2017 0606   PROT 7.9  05/10/2017 0912   ALBUMIN 4.2 05/10/2017 0912   AST 47 (H) 05/10/2017 0912   ALT 21 05/10/2017 0912   ALKPHOS 113 05/10/2017 0912   BILITOT 0.6 05/10/2017 0912   GFRNONAA >60 05/14/2017 0606   GFRAA >60 05/14/2017 0606   Lipase     Component Value Date/Time   LIPASE 19 05/10/2017 0912       Studies/Results: No results found.  Anti-infectives: Anti-infectives    Start     Dose/Rate Route Frequency Ordered Stop   05/10/17 2000  piperacillin-tazobactam (ZOSYN) IVPB 3.375 g  Status:  Discontinued     3.375 g 12.5 mL/hr over 240 Minutes Intravenous Every 8 hours 05/10/17 1106 05/13/17 0904   05/10/17 1045  piperacillin-tazobactam (ZOSYN) IVPB 3.375 g     3.375 g 100 mL/hr over 30 Minutes Intravenous  Once 05/10/17 1043 05/10/17 1249       Assessment/Plan Internal hernia POD#4 S/P EXPLORATORY LAPAROTOMY WITH ENTEROLYSIS 05/10/17 Dr. Johnathan Hausen  - WBC WNL, afebrile, hgb/hct stable - advance diet to soft diet - continue mobilizing today - will remove honeycomb dressing tomorrow  HTN A.fib - not anticoagulated on admission, went into RVR 6/30 and again 7/2 (given cardizem and HR improved) GERD - PPI  FEN - soft diet, replace potassium ID - Leukocytosis resolved; Zosyn 6/29 >>7/2 VTE: SQ heparin, SCD's  LOS: 4 days    Jerrye Beavers , [facility] Surgery 05/14/2017, 7:54 AM Pager: [phone] Consults: [phone] Mon-Fri 7:00 am-4:30 pm Sat-Sun 7:00 am-11:30 am

## 2017-05-14 NOTE — Plan of Care (Signed)
Problem: Physical Regulation: Goal: Ability to maintain clinical measurements within normal limits will improve Outcome: Progressing Pt responded positively to med to decrease HR

## 2017-05-14 NOTE — Progress Notes (Signed)
PHARMACIST - PHYSICIAN COMMUNICATION DR:   Karleen Hampshire CONCERNING: Proton Pump Inhibitor IV to Oral Route Change Policy  The patient is receiving Protonix by the intravenous route. Based on criteria approved by the Pharmacy and [place], the medication is being converted to the equivalent oral dose form.   These criteria include:  -No Active GI bleeding  -Able to tolerate diet of full liquids (or better) or tube feeding  -Able to tolerate other medications by the oral or enteral route   If you have any questions about this conversion, please contact the Pharmacy Department (ext [phone]). Thank you.  Peggyann Juba, PharmD, BCPS Pager: [phone] 05/14/2017 11:04 AM

## 2017-05-14 NOTE — Progress Notes (Signed)
OT Cancellation Note  Patient Details Name: FREDDIE DYMEK MRN: [id number] DOB: [birth year]   Cancelled Treatment:    Reason Eval/Treat Not Completed: OT screened, no needs identified, will sign off  [place], Mickel Baas, [place] 05/14/2017, 11:01 AM

## 2017-05-14 NOTE — Progress Notes (Signed)
Pt's HR was sustaining between 150-200. On call notified. Order for 10 mg Cardizem given at 2235. No change. New order for 5 mg Lopressor given at 2352. Pt maintained HR at 89-92 for the remainder of the shift

## 2017-05-14 NOTE — Care Management Important Message (Signed)
Important Message  Patient Details  Name: Cody Frazier MRN: [id number] Date of Birth: February 06, 1946   Medicare Important Message Given:  Yes    Erenest Rasher, RN 05/14/2017, 12:37 PM

## 2017-05-15 ENCOUNTER — Other Ambulatory Visit: Payer: Self-pay | Admitting: Internal Medicine

## 2017-05-15 DIAGNOSIS — I480 Paroxysmal atrial fibrillation: Secondary | ICD-10-CM

## 2017-05-15 DIAGNOSIS — L8990 Pressure ulcer of unspecified site, unspecified stage: Secondary | ICD-10-CM | POA: Insufficient documentation

## 2017-05-15 DIAGNOSIS — I700 Atherosclerosis of aorta: Secondary | ICD-10-CM

## 2017-05-15 LAB — BASIC METABOLIC PANEL
Anion gap: 11 (ref 5–15)
BUN: 9 mg/dL (ref 6–20)
CO2: 27 mmol/L (ref 22–32)
Calcium: 9.6 mg/dL (ref 8.9–10.3)
Chloride: 103 mmol/L (ref 101–111)
Creatinine, Ser: 0.73 mg/dL (ref 0.61–1.24)
GFR calc Af Amer: >60 mL/min (ref >60)
GFR calc non Af Amer: >60 mL/min (ref >60)
Glucose, Bld: 115 mg/dL — ABNORMAL HIGH (ref 65–99)
Potassium: 3.1 mmol/L — ABNORMAL LOW (ref 3.5–5.1)
Sodium: 141 mmol/L (ref 135–145)

## 2017-05-15 LAB — CBC
HCT: 36.3 % — ABNORMAL LOW (ref 39.0–52.0)
Hemoglobin: 12.3 g/dL — ABNORMAL LOW (ref 13.0–17.0)
MCH: 29 pg (ref 26.0–34.0)
MCHC: 33.9 g/dL (ref 30.0–36.0)
MCV: 85.6 fL (ref 78.0–100.0)
Platelets: 284 10*3/uL (ref 150–400)
RBC: 4.24 MIL/uL (ref 4.22–5.81)
RDW: 15.1 % (ref 11.5–15.5)
WBC: 7.7 10*3/uL (ref 4.0–10.5)

## 2017-05-15 LAB — GLUCOSE, CAPILLARY
Glucose-Capillary: 111 mg/dL — ABNORMAL HIGH (ref 65–99)
Glucose-Capillary: 129 mg/dL — ABNORMAL HIGH (ref 65–99)
Glucose-Capillary: 183 mg/dL — ABNORMAL HIGH (ref 65–99)
Glucose-Capillary: 103 mg/dL — ABNORMAL HIGH (ref 65–99)

## 2017-05-15 LAB — CULTURE, BLOOD (ROUTINE X 2)
Culture: NO GROWTH
Culture: NO GROWTH
Special Requests: ADEQUATE
Special Requests: ADEQUATE

## 2017-05-15 MED ORDER — POTASSIUM CHLORIDE CRYS ER 20 MEQ PO TBCR
40.0000 meq | EXTENDED_RELEASE_TABLET | ORAL | Status: DC
  Administered 2017-05-15: 40 meq via ORAL
  Filled 2017-05-15: qty 2

## 2017-05-15 MED ORDER — METOPROLOL TARTRATE 25 MG PO TABS
25.0000 mg | ORAL_TABLET | Freq: Three times a day (TID) | ORAL | Status: DC
  Administered 2017-05-15 – 2017-05-16 (×3): 25 mg via ORAL
  Filled 2017-05-15 (×3): qty 1

## 2017-05-15 MED ORDER — POTASSIUM CHLORIDE CRYS ER 20 MEQ PO TBCR
40.0000 meq | EXTENDED_RELEASE_TABLET | ORAL | Status: AC
  Administered 2017-05-15 – 2017-05-16 (×4): 40 meq via ORAL
  Filled 2017-05-15 (×4): qty 2

## 2017-05-15 NOTE — Progress Notes (Addendum)
Patient ID: Cody Frazier, male   DOB: 04/13/1946, 70 y.o.   MRN: [id number]  [facility] Surgery Progress Note  5 Days Post-Op  Subjective: CC- none Pt doing well this AM.  No n/v.  Eating better.  Having flatus and BM.    Objective: Vital signs in last 24 hours: Temp:  [98.2 F (36.8 C)-98.5 F (36.9 C)] 98.2 F (36.8 C) (07/04 0428) Pulse Rate:  [77-98] 77 (07/04 0428) Resp:  [16-18] 16 (07/04 0428) BP: (134-179)/(76-95) 151/89 (07/04 0428) SpO2:  [93 %-95 %] 95 % (07/04 0428) Last BM Date: 05/14/17  Intake/Output from previous day: 07/03 0701 - 07/04 0700 In: 360 [P.O.:360] Out: 2100 [Urine:2100] Intake/Output this shift: No intake/output data recorded.  PE: Gen:  Alert, NAD, pleasant Pulm: breathing comfortably Abd: Soft, NT/ND, midline incision C/D/I with staples intact and honeycomb dressing in place Ext:  No erythema, edema, or tenderness BUE/BLE  Psych: A&Ox3  Skin: no rashes noted, warm and dry  Lab Results:   Recent Labs  05/14/17 0606 05/15/17 0426  WBC 6.8 7.7  HGB 11.9* 12.3*  HCT 35.4* 36.3*  PLT 264 284   BMET  Recent Labs  05/14/17 0606 05/15/17 0426  NA 142 141  K 3.2* 3.1*  CL 105 103  CO2 29 27  GLUCOSE 109* 115*  BUN 8 9  CREATININE 0.72 0.73  CALCIUM 9.2 9.6   PT/INR No results for input(s): LABPROT, INR in the last 72 hours. CMP     Component Value Date/Time   NA 141 05/15/2017 0426   K 3.1 (L) 05/15/2017 0426   CL 103 05/15/2017 0426   CO2 27 05/15/2017 0426   GLUCOSE 115 (H) 05/15/2017 0426   GLUCOSE 107 (H) 09/13/2006 0957   BUN 9 05/15/2017 0426   CREATININE 0.73 05/15/2017 0426   CALCIUM 9.6 05/15/2017 0426   PROT 7.9 05/10/2017 0912   ALBUMIN 4.2 05/10/2017 0912   AST 47 (H) 05/10/2017 0912   ALT 21 05/10/2017 0912   ALKPHOS 113 05/10/2017 0912   BILITOT 0.6 05/10/2017 0912   GFRNONAA >60 05/15/2017 0426   GFRAA >60 05/15/2017 0426   Lipase     Component Value Date/Time   LIPASE 19 05/10/2017  0912       Studies/Results: No results found.  Anti-infectives: Anti-infectives    Start     Dose/Rate Route Frequency Ordered Stop   05/10/17 2000  piperacillin-tazobactam (ZOSYN) IVPB 3.375 g  Status:  Discontinued     3.375 g 12.5 mL/hr over 240 Minutes Intravenous Every 8 hours 05/10/17 1106 05/13/17 0904   05/10/17 1045  piperacillin-tazobactam (ZOSYN) IVPB 3.375 g     3.375 g 100 mL/hr over 30 Minutes Intravenous  Once 05/10/17 1043 05/10/17 1249       Assessment/Plan Internal hernia POD#5 S/P EXPLORATORY LAPAROTOMY WITH ENTEROLYSIS 05/10/17 Dr. Johnathan Hausen  - WBC WNL, afebrile, hgb/hct stable - soft diet - continue mobilizing today - dressing to come off today. Have sent messages to nursing staff at [facility] for staple removal next week and follow up with Dr. Hassell Done in 2 weeks.  HTN A.fib - not anticoagulated on admission, went into RVR 6/30 and again 7/2 (given cardizem and HR improved) GERD - PPI  FEN - soft diet, replace potassium additionally.  Ok to slowly advance to heart healthy diet at home. ID - Leukocytosis resolved; Zosyn 6/29 >>7/2 VTE: SQ heparin, SCD's  Dispo - per medicine.  Ok from surgery/abdominal standpoint for d/c.  May not  be ready medically if potassium/HR not controlled.     LOS: 5 days    [place] , [facility] 05/15/2017, 8:48 AM

## 2017-05-15 NOTE — Progress Notes (Signed)
Physical Therapy Treatment Patient Details Name: Cody Frazier MRN: [id number] DOB: 1946/05/22 Today's Date: 05/15/2017    History of Present Illness Cody Frazier is a 71 y.o. male with medical history significant of HTN, paroxysmal A. fib, PUD, GERD;  who presented to ED 05/10/17  with complaints of right-sided abdominal pain with nausea and vomiting.  Noted seizure-like episode in Ed, found to be in Afib w/ RVR. S/P laparotomy 05/10/17 for lysis of adhesions and reduction of twisted Small bowel.    PT Comments    Assisted OOB to amb in hallway without an AD this session.  Present with good alternating gait and LOB.  AVG HR 109.  Also practiced stairs (5) with spouse present.  Pt progressing well with his mobility.    Follow Up Recommendations  No PT follow up     Equipment Recommendations  None recommended by PT    Recommendations for Other Services       Precautions / Restrictions Precautions Precautions: None Precaution Comments: monitor HR Restrictions Weight Bearing Restrictions: No    Mobility  Bed Mobility Overal bed mobility: Modified Independent             General bed mobility comments: able to self assist OOB and back to bed with increased time  Transfers   Equipment used: None Transfers: Sit to/from [facility] Sit to Stand: Supervision;Min guard Stand pivot transfers: Supervision;Min guard       General transfer comment: one VC safety with turns otherwise good use of hands to steady self  Ambulation/Gait Ambulation/Gait assistance: Supervision;Min guard Ambulation Distance (Feet): 145 Feet Assistive device: None Gait Pattern/deviations: Step-through pattern     General Gait Details: amb without any AD this session for trial.  Present with steady alternating gait and WFL gait speed.     Stairs Stairs: Yes   Stair Management: One rail Right;Step to pattern;Forwards Number of Stairs: 5 General stair comments: step to  stair training with spouse present.  Tolerated well.   Wheelchair Mobility    Modified Rankin (Stroke Patients Only)       Balance                                            Cognition Arousal/Alertness: Awake/alert Behavior During Therapy: WFL for tasks assessed/performed Overall Cognitive Status: Within Functional Limits for tasks assessed                                        Exercises      General Comments        Pertinent Vitals/Pain Pain Assessment: No/denies pain    Home Living                      Prior Function            PT Goals (current goals can now be found in the care plan section) Progress towards PT goals: Progressing toward goals    Frequency    Min 3X/week      PT Plan Current plan remains appropriate    Co-evaluation              AM-PAC PT "6 Clicks" Daily Activity  Outcome Measure  Difficulty turning over in bed (including adjusting bedclothes, sheets and  blankets)?: Total Difficulty moving from lying on back to sitting on the side of the bed? : Total Difficulty sitting down on and standing up from a chair with arms (e.g., wheelchair, bedside commode, etc,.)?: Total Help needed moving to and from a bed to chair (including a wheelchair)?: A Lot Help needed walking in hospital room?: A Lot Help needed climbing 3-5 steps with a railing? : A Lot 6 Click Score: 9    End of Session Equipment Utilized During Treatment: Gait belt Activity Tolerance: Patient tolerated treatment well Patient left: in bed   PT Visit Diagnosis: Difficulty in walking, not elsewhere classified (R26.2)     Time: 9211-9417 PT Time Calculation (min) (ACUTE ONLY): 10 min  Charges:  $Gait Training: 8-22 mins                    G Codes:       Rica Koyanagi  PTA [facility]  Acute  Rehab Pager      [phone]

## 2017-05-15 NOTE — Progress Notes (Signed)
PROGRESS NOTE    Cody Frazier  BJS:[id number] DOB: 02/04/1946 DOA: 05/10/2017 PCP: Cassandria Anger, MD     Brief Narrative:  Cody Frazier is a 71 y.o. male with medical history significant of HTN, paroxysmal A. fib, PUD, GERD;  who presented with complaints of right-sided abdominal pain with nausea and vomiting Wednesday night (6/27). Symptoms started after he ate food that had been sitting out for over 3 hours. Patient was awoken out of sleep due to symptoms. Pain is constant and sharp. He's had multiple episodes of emesis which were noted to be nonbloody. He reports that he still able to pass flatus and has been belching a lot, but his last bowel movement was 2 days ago. While waiting in the waiting room in the ED, patient had what was reported to be a seizure-like episode where his eyes rolled up and he had a blank stare and was diaphoretic for which he was taken back immediately for further evaluation. On initial EKG patient was noted to be in A. fib with RVR 187 bpm, but heart rate improved with IV fluids. CT scan of the abdomen showed signs of possible internal hernia. Gen. surgery was consulted. Patient underwent exploratory laparotomy with enteral lysis of adhesions 6/29.   Assessment & Plan:   Principal Problem:   Internal hernia Active Problems:   Hypertensive heart disease without CHF   Paroxysmal atrial fibrillation (HCC)   Atherosclerosis of aorta (HCC)   Pressure injury of skin   Internal abdominal hernia -S/p Laparotomy with extensive enteral lysis 6/29  -Per general surgery. Doing well, diet advanced, will need to follow-up for staple removal next week and follow-up with with Dr. Hassell Done in 2 weeks   A. fib with RVR -Chadsvasc=3. Patient previously noted of going into A. fib with RVR postop. Has seen cardiology (Dr. Percival Spanish) after last episode after back surgery and was not started on anticoagulation. Went into A Fib RVR post-operatively.  -Echo completed, EF  60%, grade 1 diastolic dysfunction, mild LVH -Cardiology consulted due to recurrent A Fib RVR episodes. Continue cardizem, metoprolol and monitor.   Hypokalemia -Replace and trend   Essential hypertension -Continue ARB   Acute kidney injury secondary to severe dehydration -Patient's baseline creatinine was previously 1.1- 1.17 -Resolved with IVF   GERD -Protonix    DVT prophylaxis: subq hep Code Status: full Family Communication: no family at bedside  Disposition Plan: discharge home with [facility] service when stable   Consultants:   General Surgery  Cardiology   Procedures:  Laparotomy with extensive enteral lysis 6/29   Antimicrobials:  Anti-infectives    Start     Dose/Rate Route Frequency Ordered Stop   05/10/17 2000  piperacillin-tazobactam (ZOSYN) IVPB 3.375 g  Status:  Discontinued     3.375 g 12.5 mL/hr over 240 Minutes Intravenous Every 8 hours 05/10/17 1106 05/13/17 0904   05/10/17 1045  piperacillin-tazobactam (ZOSYN) IVPB 3.375 g     3.375 g 100 mL/hr over 30 Minutes Intravenous  Once 05/10/17 1043 05/10/17 1249       Subjective: Feeling well this morning. Tolerating diet. No abdominal pain. Ambulating well.   Objective: Vitals:   05/14/17 2202 05/14/17 2228 05/15/17 0018 05/15/17 0428  BP: (!) 179/94 (!) 149/91 (!) 146/76 (!) 151/89  Pulse: 82 77 80 77  Resp:    16  Temp:    98.2 F (36.8 C)  TempSrc:    Oral  SpO2:   94% 95%  Weight:  Height:        Intake/Output Summary (Last 24 hours) at 05/15/17 1231 Last data filed at 05/15/17 0520  Gross per 24 hour  Intake                0 ml  Output             1300 ml  Net            -1300 ml   Filed Weights   05/10/17 0846  Weight: 97.5 kg (215 lb)    Examination:  General exam: Appears calm and comfortable  Respiratory system: Clear to auscultation. Respiratory effort normal. Cardiovascular system: S1 & S2 heard, RRR. No JVD, murmurs, rubs, gallops or clicks. No pedal  edema. Gastrointestinal system: Abdomen is nondistended, soft and nontender, +incision with dressing dry and clean . No organomegaly or masses felt. Central nervous system: Alert and oriented. No focal neurological deficits. Extremities: Symmetric  Skin: No rashes, lesions or ulcers on exposed skin  Psychiatry: Judgement and insight appear normal. Mood & affect appropriate.   Data Reviewed: I have personally reviewed following labs and imaging studies  CBC:  Recent Labs Lab 05/10/17 0912  05/11/17 0728 05/12/17 1033 05/13/17 0349 05/14/17 0606 05/15/17 0426  WBC 24.2*  < > 13.2* 9.3 8.1 6.8 7.7  NEUTROABS 21.7*  --   --   --   --   --   --   HGB 17.8*  < > 12.0* 10.9* 11.0* 11.9* 12.3*  HCT 52.3*  < > 36.5* 33.0* 33.9* 35.4* 36.3*  MCV 88.2  < > 87.5 89.2 89.9 85.5 85.6  PLT 347  < > 209 212 238 264 284  < > = values in this interval not displayed. Basic Metabolic Panel:  Recent Labs Lab 05/10/17 0950  05/11/17 0728 05/12/17 1033 05/13/17 0349 05/14/17 0606 05/15/17 0426  NA  --   < > 143 142 140 142 141  K  --   < > 4.0 4.0 3.6 3.2* 3.1*  CL  --   < > 113* 114* 109 105 103  CO2  --   --  22 24 25 29 27   GLUCOSE  --   < > 168* 128* 113* 109* 115*  BUN  --   < > 24* 22* 15 8 9   CREATININE  --   < > 1.19 0.92 0.84 0.72 0.73  CALCIUM  --   --  8.4* 8.6* 8.8* 9.2 9.6  MG 2.3  --   --   --   --   --   --   < > = values in this interval not displayed. GFR: Estimated Creatinine Clearance: 92.6 mL/min (by C-G formula based on SCr of 0.73 mg/dL). Liver Function Tests:  Recent Labs Lab 05/10/17 0912  AST 47*  ALT 21  ALKPHOS 113  BILITOT 0.6  PROT 7.9  ALBUMIN 4.2    Recent Labs Lab 05/10/17 0912  LIPASE 19   No results for input(s): AMMONIA in the last 168 hours. Coagulation Profile:  Recent Labs Lab 05/10/17 0920  INR 1.11   Cardiac Enzymes:  Recent Labs Lab 05/10/17 0920  TROPONINI <0.03   BNP (last 3 results) No results for input(s): PROBNP  in the last 8760 hours. HbA1C: No results for input(s): HGBA1C in the last 72 hours. CBG:  Recent Labs Lab 05/11/17 1617 05/15/17 0741 05/15/17 1148  GLUCAP 136* 111* 129*   Lipid Profile: No results for input(s): CHOL, HDL, LDLCALC, TRIG,  CHOLHDL, LDLDIRECT in the last 72 hours. Thyroid Function Tests: No results for input(s): TSH, T4TOTAL, FREET4, T3FREE, THYROIDAB in the last 72 hours. Anemia Panel: No results for input(s): VITAMINB12, FOLATE, FERRITIN, TIBC, IRON, RETICCTPCT in the last 72 hours. Sepsis Labs:  Recent Labs Lab 05/10/17 1000 05/10/17 1244 05/11/17 0728  LATICACIDVEN 7.04* 3.60* 1.5    Recent Results (from the past 240 hour(s))  Blood Culture (routine x 2)     Status: None   Collection Time: 05/10/17 10:43 AM  Result Value Ref Range Status   Specimen Description BLOOD BLOOD RIGHT HAND  Final   Special Requests IN PEDIATRIC BOTTLE Blood Culture adequate volume  Final   Culture   Final    NO GROWTH 5 DAYS Performed at [facility], [place] [address], [place], [place] [address]    Report Status 05/15/2017 FINAL  Final  Blood Culture (routine x 2)     Status: None   Collection Time: 05/10/17 12:00 PM  Result Value Ref Range Status   Specimen Description BLOOD BLOOD LEFT HAND  Final   Special Requests   Final    BOTTLES DRAWN AEROBIC AND ANAEROBIC Blood Culture adequate volume   Culture   Final    NO GROWTH 5 DAYS Performed at [facility], [place] [address], [place], [place] [address]    Report Status 05/15/2017 FINAL  Final  Surgical PCR screen     Status: Abnormal   Collection Time: 05/10/17  4:17 PM  Result Value Ref Range Status   MRSA, PCR NEGATIVE NEGATIVE Final   Staphylococcus aureus POSITIVE (A) NEGATIVE Final    Comment:        The Xpert SA Assay (FDA approved for NASAL specimens in patients over 20 years of age), is one component of a comprehensive surveillance program.  Test performance has been validated by [facility]  for patients greater than or equal to 33 year old. It is not intended to diagnose infection nor to guide or monitor treatment.        Radiology Studies: No results found.    Scheduled Meds: . acetaminophen  650 mg Oral Q6H  . Chlorhexidine Gluconate Cloth  6 each Topical Daily  . diltiazem  30 mg Oral Q6H  . fluticasone  2 spray Each Nare Daily  . heparin  5,000 Units Subcutaneous Q8H  . hydrocortisone cream   Topical TID  . irbesartan  300 mg Oral Daily  . latanoprost  1 drop Both Eyes QHS  . metoprolol tartrate  25 mg Oral Q8H  . mupirocin ointment  1 application Nasal BID  . pantoprazole  40 mg Oral BID AC  . polyethylene glycol  17 g Oral Daily  . potassium chloride  40 mEq Oral Q4H  . sodium chloride flush  3 mL Intravenous Q12H   Continuous Infusions: . sodium chloride 10 mL/hr at 05/13/17 1100     LOS: 5 days    Time spent: 30 minutes   Dessa Phi, DO [facility] [email or web address] Password [facility] 05/15/2017, 12:31 PM

## 2017-05-15 NOTE — Discharge Instructions (Signed)
[facility]      [facility] [phone]  OPEN ABDOMINAL SURGERY: POST OP INSTRUCTIONS  Always review your discharge instruction sheet given to you by the facility where your surgery was performed.  IF YOU HAVE DISABILITY OR FAMILY LEAVE FORMS, YOU MUST BRING THEM TO THE OFFICE FOR PROCESSING.  PLEASE DO NOT GIVE THEM TO YOUR DOCTOR.  1. A prescription for pain medication may be given to you upon discharge.  Take your pain medication as prescribed, if needed.  If narcotic pain medicine is not needed, then you may take acetaminophen (Tylenol) or ibuprofen (Advil) as needed. 2. Take your usually prescribed medications unless otherwise directed. 3. If you need a refill on your pain medication, please contact your pharmacy. They will contact our office to request authorization.  Prescriptions will not be filled after 5pm or on week-ends. 4. You should follow a light diet the first few days after arrival home, such as soup and crackers, pudding, etc.unless your doctor has advised otherwise. A high-fiber, low fat diet can be resumed as tolerated.   Be sure to include lots of fluids daily. Most patients will experience some swelling and bruising on the chest and neck area.  Ice packs will help.  Swelling and bruising can take several days to resolve 5. Most patients will experience some swelling and bruising in the area of the incision. Ice pack will help. Swelling and bruising can take several days to resolve..  6. It is common to experience some constipation if taking pain medication after surgery.  Increasing fluid intake and taking a stool softener will usually help or prevent this problem from occurring.  A mild laxative (Milk of Magnesia or Miralax) should be taken according to package directions if there are no bowel movements after 48 hours. 7.  You may have steri-strips (small skin tapes) in place directly over the incision.  These strips should be left on the skin for 7-10 days.  If your  surgeon used skin glue on the incision, you may shower in 24 hours.  The glue will flake off over the next 2-3 weeks.  Any sutures or staples will be removed at the office during your follow-up visit. You may find that a light gauze bandage over your incision may keep your staples from being rubbed or pulled. You may shower and replace the bandage daily. 8. ACTIVITIES:  You may resume regular (light) daily activities beginning the next day--such as daily self-care, walking, climbing stairs--gradually increasing activities as tolerated.  You may have sexual intercourse when it is comfortable.  Refrain from any heavy lifting or straining until approved by your doctor. a. You may drive when you no longer are taking prescription pain medication, you can comfortably wear a seatbelt, and you can safely maneuver your car and apply brakes b. Return to Work: ___________________________________ 9. You should see your doctor in the office for a follow-up appointment approximately two weeks after your surgery.  Make sure that you call for this appointment within a day or two after you arrive home to insure a convenient appointment time. OTHER INSTRUCTIONS:  _____________________________________________________________ _____________________________________________________________  WHEN TO CALL YOUR DOCTOR: 1. Fever over 101.0 2. Inability to urinate 3. Nausea and/or vomiting 4. Extreme swelling or bruising 5. Continued bleeding from incision. 6. Increased pain, redness, or drainage from the incision. 7. Difficulty swallowing or breathing 8. Muscle cramping or spasms. 9. Numbness or tingling in hands or feet or around lips.  The clinic staff is available to   answer your questions during regular business hours.  Please don't hesitate to call and ask to speak to one of the nurses if you have concerns.  For further questions, please visit [email or web address]   

## 2017-05-15 NOTE — Progress Notes (Signed)
Pt required 10 mg Labetolol @ 8828 for BP of 179/94. Med effective bringing BP down to 149/91. Pt in NSR at around 80 throughout shift. No Loose stools this shift.

## 2017-05-15 NOTE — Consult Note (Signed)
Cardiology Consult Note  Admit date: 05/10/2017 Name: Cody Frazier 71 y.o.  male DOB:  07-06-1946 MRN:  [id number]  Today's date:  05/15/2017  Referring Physician:    [facility]  Primary Physician:    Dr. Alain Marion  Reason for Consultation:    Atrial fibrillation  IMPRESSIONS: 1.  Paroxysmal atrial fibrillation with recurrence yesterday 2.  Hypertensive heart disease with history of LVH by echo 3.  Exertional dyspnea with negative pharmacologic stress test in September 4.  Aortic atherosclerosis 5.  Prior history of GI bleeding requiring transfusions due to large ulcer 6.  Chronic back pain 7.  Hyperlipidemia  RECOMMENDATION: Echo and old records were reviewed.  His CHA2DS2VASC score is 3 and he would benefit from anticoagulation although he had a serious GI bleed previously a number of years ago.  He was seen by Dr. Percival Spanish greater than 5 years after transient atrial fibrillation previously.  He has had recurrent since he was in here.  At this point in time his blood pressure is still elevated and I would add metoprolol and continue to monitor overnight.  If he remains free of atrial fibrillation he may be discharged tomorrow and I can place him on a monitor as an outpatient.  He would benefit from anticoagulation although his bleeding risk would be increased and I'm hesitant to put him on anticoagulation light of his recent surgery.   I also would replete his potassium today.   HISTORY: This very nice 72 year old male is seen for evaluation of paroxysmal atrial fibrillation.  The patient had an isolated episode of paroxysmal atrial fibrillation at the time of back surgery number of years ago and was seen by Dr. Percival Spanish.  He has done fairly well since that time but does have essential hypertension as well as hyperlipidemia.  More recently he has noted worsening dyspnea on exertion and had a pharmacologic stress test that was normal in September of last year.  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  Prior to this he had atrial fibrillation and has had recurrences of atrial fibrillation the last being yesterday since surgery.  He was placed on low-dose diltiazem and cardiology was asked to see him today.  An echocardiogram this admission shows normal LV function with an EF of 65% and mild LVH.  He has disproportionate septal thickness.  He currently denies angina.  He has no PND or orthopnea but has some mild chronic edema.  He is unaware of any palpitations as an outpatient.  Past Medical History:  Diagnosis Date  . Allergic rhinitis   . Anemia, iron deficiency   . Atrial arrhythmia   . Atrial fib/flutter, transient    following prior surgeries x 2  . Barrett's esophagus without dysplasia   . BPH (benign prostatic hyperplasia)   . Chronic LBP   . DDD (degenerative disc disease)   . Diverticulosis of colon   . Duodenal stricture   . Gastric outlet obstruction   . GERD (gastroesophageal reflux disease)   . Hemorrhoids   . Hyperlipidemia   . Hyperplastic colonic polyp 01/2000  . Hypertension   . Nephrolithiasis    hx of B  . Peptic ulcer disease with hemorrhage 08/2008   and [facility]. Pylori Ab negative  . Renal cyst   . Tubular adenoma of colon 03/2012      Past Surgical History:  Procedure Laterality Date  . APPENDECTOMY    .  BACK SURGERY  02/2003   L5  . BILROTH I PROCEDURE  2011   Dr Johney Maine  . HIATAL HERNIA REPAIR    . LAPAROTOMY N/A 05/10/2017   Procedure: EXPLORATORY LAPAROTOMY WITH ENTEROLYSIS;  Surgeon: Johnathan Hausen, MD;  Location: [facility];  Service: General;  Laterality: N/A;  . LUMBAR FUSION  2009     Allergies:  is allergic to codeine phosphate and nsaids.   Medications: Prior to Admission medications   Medication Sig Start Date End Date Taking? Authorizing Provider  acetaminophen (TYLENOL) 500 MG tablet Take 1,000 mg by mouth every 6 (six) hours as needed for mild  pain or moderate pain.   Yes [provider]  Alum Hydroxide-Mag Trisilicate (GAVISCON) 75-17.0 MG CHEW Chew by mouth as needed.     Yes [provider]  atorvastatin (LIPITOR) 20 MG tablet TAKE 1 TABLET BY MOUTH EVERY DAY 07/18/16  Yes Plotnikov, Evie Lacks, MD  busPIRone (BUSPAR) 7.5 MG tablet TAKE 1 - 2 TABLETS DAILY AS DIRECTED FOR ANXIETY. 03/26/17  Yes Plotnikov, Evie Lacks, MD  cetirizine (ZYRTEC) 10 MG tablet Take 10 mg by mouth daily.   Yes [provider]  clotrimazole-betamethasone (LOTRISONE) cream Apply topically 2 (two) times daily. 08/22/16  Yes Plotnikov, Evie Lacks, MD  ferrous sulfate 325 (65 FE) MG tablet Take 325 mg by mouth 2 (two) times daily.     Yes [provider]  fluticasone (FLONASE) 50 MCG/ACT nasal spray Place 2 sprays into both nostrils daily. 02/22/17  Yes Plotnikov, Evie Lacks, MD  latanoprost (XALATAN) 0.005 % ophthalmic solution Place 1 drop into both eyes at bedtime. 03/16/17  Yes [provider]  metoCLOPramide (REGLAN) 5 MG tablet TAKE 1 TABLET (5 MG TOTAL) BY MOUTH EVERY 8 (EIGHT) HOURS AS NEEDED FOR NAUSEA. 03/22/17  Yes Plotnikov, Evie Lacks, MD  Multiple Vitamins-Minerals (CENTRUM SILVER PO) Take by mouth daily.     Yes [provider]  ondansetron (ZOFRAN) 4 MG tablet TAKE 1 TABLET (4 MG TOTAL) BY MOUTH EVERY 8 (EIGHT) HOURS AS NEEDED FOR NAUSEA OR VOMITING. 11/18/15  Yes Plotnikov, Evie Lacks, MD  pantoprazole (PROTONIX) 40 MG tablet Take 1 tablet (40 mg total) by mouth 2 (two) times daily. 04/10/17  Yes Ladene Artist, MD  valsartan (DIOVAN) 320 MG tablet Take 1 tablet (320 mg total) by mouth daily. 02/06/17  Yes Plotnikov, Evie Lacks, MD  oxyCODONE (ROXICODONE) 15 MG immediate release tablet Take 1 tablet (15 mg total) by mouth 4 (four) times daily as needed for pain. For pain - Fill on or after 04/25/17 Please fill every 30 days Patient not taking: Reported on 05/10/2017 02/06/17   Plotnikov, Evie Lacks, MD   triamcinolone cream (KENALOG) 0.5 % Apply 1 application topically 3 (three) times daily. Patient not taking: Reported on 05/10/2017 11/22/16   Plotnikov, Evie Lacks, MD   Family History: Family Status  Relation Status  . Mother Deceased at age 62       colon cancer  . Father Deceased at age 9       colon cancer  . Annamarie Major Deceased  . Annamarie Major Deceased  . Other (Not Specified)  . Cousin (Not Specified)  . Neg Hx (Not Specified)    Social History:   reports that he has quit smoking. He has never used smokeless tobacco. He reports that he does not drink alcohol or use drugs.   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  He has been married a number of years.   Review of Systems: He has some mild chronic fatigue.  He has a history of kidney stones and symptoms of BPH.  He still has some chronic back pain from his previous surgery.  Other than as noted above the remainder of the review of systems is unremarkable.  Physical Exam: BP (!) 151/89 (BP Location: Left Arm)   Pulse 77   Temp 98.2 F (36.8 C) (Oral)   Resp 16   Ht 5\' 6"  (1.676 m)   Wt 97.5 kg (215 lb)   SpO2 95%   BMI 34.70 kg/m    General appearance: Pleasant bearded male in no acute distress Head: Normocephalic, without obvious abnormality Eyes: conjunctivae/corneas clear. PERRL, EOM's intact. Fundi not examined  Neck: no adenopathy, no carotid bruit, no JVD and supple, symmetrical, trachea midline Lungs: clear to auscultation bilaterally Heart: regular rate and rhythm, S1, S2 normal, no murmur, click, rub or gallop Abdomen: Mild tenderness due to previous surgery, normal bowel sounds. Rectal: deferred Extremities: Trace edema, normal reduction of motion. Pulses: 2+ and symmetric Skin: Skin color, texture, turgor normal. No rashes or lesions Neurologic: Grossly normal Psych: Alert and oriented x 3 Labs: CBC  Recent  Labs  05/15/17 0426  WBC 7.7  RBC 4.24  HGB 12.3*  HCT 36.3*  PLT 284  MCV 85.6  MCH 29.0  MCHC 33.9  RDW 15.1   CMP   Recent Labs  05/15/17 0426  NA 141  K 3.1*  CL 103  CO2 27  GLUCOSE 115*  BUN 9  CREATININE 0.73  CALCIUM 9.6  GFRNONAA >60  GFRAA >60    Radiology:  CT scan shows an internal hernia with multiple loops of small bowel, mild ascites and aortic atherosclerosis.  EKG: On admission his EKG was normal except he was missing lead V6.  Another EKG is noted that shows rapid atrial fibrillation. Independently reviewed by me  Signed:  W. Doristine Church MD [facility]   Cardiology Consultant  05/15/2017, 11:18 AM

## 2017-05-15 NOTE — Progress Notes (Signed)
Assumed care of this patient at 1500.  I agree with the previous nurses assessment.   

## 2017-05-15 NOTE — Progress Notes (Signed)
Per CCMD pt HR has been switching in and out of a-fib(up to the 140's), unsustained.  Currently NS in the 90's.  Asymptomatic.  MD made aware.  Will continue to monitor closely.

## 2017-05-16 ENCOUNTER — Telehealth: Payer: Self-pay | Admitting: *Deleted

## 2017-05-16 LAB — BASIC METABOLIC PANEL
Anion gap: 9 (ref 5–15)
BUN: 10 mg/dL (ref 6–20)
CO2: 25 mmol/L (ref 22–32)
Calcium: 9.7 mg/dL (ref 8.9–10.3)
Chloride: 103 mmol/L (ref 101–111)
Creatinine, Ser: 0.82 mg/dL (ref 0.61–1.24)
GFR calc Af Amer: >60 mL/min (ref >60)
GFR calc non Af Amer: >60 mL/min (ref >60)
Glucose, Bld: 111 mg/dL — ABNORMAL HIGH (ref 65–99)
Potassium: 4.3 mmol/L (ref 3.5–5.1)
Sodium: 137 mmol/L (ref 135–145)

## 2017-05-16 MED ORDER — METOPROLOL TARTRATE 25 MG PO TABS: 25.0000 mg | ORAL_TABLET | Freq: Three times a day (TID) | ORAL | 0 refills | Status: DC

## 2017-05-16 MED ORDER — DILTIAZEM HCL ER COATED BEADS 180 MG PO CP24: 180.0000 mg | ORAL_CAPSULE | Freq: Every day | ORAL | 0 refills | Status: DC

## 2017-05-16 MED ORDER — POLYETHYLENE GLYCOL 3350 17 G PO PACK: 17.0000 g | PACK | Freq: Every day | ORAL | 0 refills | Status: DC

## 2017-05-16 MED ORDER — DILTIAZEM HCL ER COATED BEADS 180 MG PO CP24
180.0000 mg | ORAL_CAPSULE | Freq: Every day | ORAL | Status: DC
  Administered 2017-05-16: 180 mg via ORAL
  Filled 2017-05-16: qty 1

## 2017-05-16 NOTE — Progress Notes (Signed)
Patient ID: Cody Frazier, male   DOB: May 07, 1946, 71 y.o.   MRN: [id number]  [facility] Surgery Progress Note  6 Days Post-Op  Subjective: CC- no complaints, s/p ex alp Sitting up in bed eating breakfast. No complaints. States that he feels great and hopes to go home. Denies abdominal pain, n/v. He is tolerating a regular diet and having bowel function.  Objective: Vital signs in last 24 hours: Temp:  [98 F (36.7 C)-98.7 F (37.1 C)] 98.7 F (37.1 C) (07/05 0527) Pulse Rate:  [75-119] 75 (07/05 0527) Resp:  [18] 18 (07/05 0527) BP: (132-157)/(76-96) 157/96 (07/05 0527) SpO2:  [95 %-96 %] 96 % (07/05 0527) Last BM Date: 05/15/17  Intake/Output from previous day: 07/04 0701 - 07/05 0700 In: 720 [P.O.:720] Out: 1600 [Urine:1600] Intake/Output this shift: No intake/output data recorded.  PE: Gen:  Alert, NAD, pleasant HEENT: EOM's intact, pupils equal  Pulm:  CTAB, no W/R/R, effort normal Abd: Soft, NT/ND, +BS, midline incision C/D/I with staples intact Ext:  No erythema, edema, or tenderness BUE/BLE  Psych: A&Ox3  Skin: no rashes noted, warm and dry  Lab Results:   Recent Labs  05/14/17 0606 05/15/17 0426  WBC 6.8 7.7  HGB 11.9* 12.3*  HCT 35.4* 36.3*  PLT 264 284   BMET  Recent Labs  05/15/17 0426 05/16/17 0414  NA 141 137  K 3.1* 4.3  CL 103 103  CO2 27 25  GLUCOSE 115* 111*  BUN 9 10  CREATININE 0.73 0.82  CALCIUM 9.6 9.7   PT/INR No results for input(s): LABPROT, INR in the last 72 hours. CMP     Component Value Date/Time   NA 137 05/16/2017 0414   K 4.3 05/16/2017 0414   CL 103 05/16/2017 0414   CO2 25 05/16/2017 0414   GLUCOSE 111 (H) 05/16/2017 0414   GLUCOSE 107 (H) 09/13/2006 0957   BUN 10 05/16/2017 0414   CREATININE 0.82 05/16/2017 0414   CALCIUM 9.7 05/16/2017 0414   PROT 7.9 05/10/2017 0912   ALBUMIN 4.2 05/10/2017 0912   AST 47 (H) 05/10/2017 0912   ALT 21 05/10/2017 0912   ALKPHOS 113 05/10/2017 0912   BILITOT 0.6  05/10/2017 0912   GFRNONAA >60 05/16/2017 0414   GFRAA >60 05/16/2017 0414   Lipase     Component Value Date/Time   LIPASE 19 05/10/2017 0912       Studies/Results: No results found.  Anti-infectives: Anti-infectives    Start     Dose/Rate Route Frequency Ordered Stop   05/10/17 2000  piperacillin-tazobactam (ZOSYN) IVPB 3.375 g  Status:  Discontinued     3.375 g 12.5 mL/hr over 240 Minutes Intravenous Every 8 hours 05/10/17 1106 05/13/17 0904   05/10/17 1045  piperacillin-tazobactam (ZOSYN) IVPB 3.375 g     3.375 g 100 mL/hr over 30 Minutes Intravenous  Once 05/10/17 1043 05/10/17 1249       Assessment/Plan Internal hernia POD#6 S/P EXPLORATORY LAPAROTOMY WITH ENTEROLYSIS 05/10/17 Dr. Johnathan Hausen  - afebrile - tolerating soft diet and having bowel function - patient ready for discharge from surgical standpoint. She will follow up next week for staple removal, and with Dr. Hassell Done about 2 weeks after that. Instructions in AVS.  HTN A.fib GERD - PPI  FEN -soft diet.  Ok to slowly advance to heart healthy diet at home. ID - Zosyn 6/29 >>7/2 VTE: SQ heparin, SCD's   LOS: 6 days    Jerrye Beavers , [facility] Surgery 05/16/2017,  8:44 AM Pager: [phone] Consults: [phone] Mon-Fri 7:00 am-4:30 pm Sat-Sun 7:00 am-11:30 am

## 2017-05-16 NOTE — Discharge Summary (Signed)
Physician Discharge Summary  Cody Frazier OZD:[id number] DOB: Jul 30, 1972 DOA: 05/10/2017  PCP: Cassandria Anger, MD  Admit date: 05/10/2017 Discharge date: 05/16/2017  Admitted From: Home Disposition:  Home  Recommendations for Outpatient Follow-up:  1. Follow up with PCP in 1 week 2. Follow up with General Surgery next week for staple removal, and also with Dr. Hassell Done in 2 weeks  3. Follow up with Cardiology Dr. Wynonia Lawman in 2 weeks, also follow up for cardiac event monitor  4. Please obtain BMP/CBC in 1 week   Discharge Condition: Stable CODE STATUS: Full  Diet recommendation: Heart healthy   Brief/Interim Summary: Minor Cody [name] a 71 y.o.malewith medical history significant of HTN, paroxysmal A. fib, PUD, GERD; who presented with complaints of right-sided abdominal pain with nausea and vomiting Wednesday night (6/27). Symptoms started after he ate food that had been sitting out for over 3 hours. Patient was awoken out of sleep due to symptoms. Pain is constant and sharp. He's had multiple episodes of emesis which were noted to be nonbloody. He reports that he still able to pass flatus and has been belching a lot, but his last bowel movement was 2 days ago. While waiting in the waiting room in the ED, patient had what was reported to be a seizure-like episode where his eyes rolled up and he had a blank stare and was diaphoretic for which he was taken back immediately for further evaluation. On initial EKG patient was noted to be in A. fib with RVR 187 bpm,but heart rate improved with IV fluids. CT scan of the abdomen showed signs of possible internal hernia. Gen. surgery was consulted. Patient underwent exploratory laparotomy with enteral lysis of adhesions 6/29.   Internal abdominal hernia -S/p Laparotomy with extensive enteral lysis 6/29  -Per general surgery. Doing well, diet advanced, will need to follow-up for staple removal next week and follow-up with with Dr. Hassell Done in 2  weeks   A. fib with RVR -Chadsvasc=3. Patient previously noted of going into A. fib with RVR postop. Has seen cardiology (Dr. Percival Spanish) after last episode after back surgery and was not started on anticoagulation. Went into A Fib RVR post-operatively.  -Echo completed, EF 25%, grade 1 diastolic dysfunction, mild LVH -Cardiology consulted due to recurrent A Fib RVR episodes. Continue cardizem, metoprolol. Follow up for outpatient cardiac monitor and with Dr. Wynonia Lawman in 2 weeks to discuss further anticoagulation recommendations.  Essential hypertension -Continue ARB   Acute kidney injury secondary to severe dehydration -Patient's baseline creatinine was previously 1.1- 1.17 -Resolved with IVF   GERD -Protonix    Discharge Instructions  Discharge Instructions    Amb referral to [facility]    Complete by:  As directed    Call MD for:  difficulty breathing, headache or visual disturbances    Complete by:  As directed    Call MD for:  extreme fatigue    Complete by:  As directed    Call MD for:  hives    Complete by:  As directed    Call MD for:  persistant dizziness or light-headedness    Complete by:  As directed    Call MD for:  persistant nausea and vomiting    Complete by:  As directed    Call MD for:  redness, tenderness, or signs of infection (pain, swelling, redness, odor or green/yellow discharge around incision site)    Complete by:  As directed    Call MD for:  severe uncontrolled pain  Complete by:  As directed    Call MD for:  temperature >100.4    Complete by:  As directed    Diet - low sodium heart healthy    Complete by:  As directed    Discharge instructions    Complete by:  As directed    You were cared for by a hospitalist during your hospital stay. If you have any questions about your discharge medications or the care you received while you were in the hospital after you are discharged, you can call the unit and asked to speak with the hospitalist on  call if the hospitalist that took care of you is not available. Once you are discharged, your primary care physician will handle any further medical issues. Please note that NO REFILLS for any discharge medications will be authorized once you are discharged, as it is imperative that you return to your primary care physician (or establish a relationship with a primary care physician if you do not have one) for your aftercare needs so that they can reassess your need for medications and monitor your lab values.   Increase activity slowly    Complete by:  As directed    Lifting restrictions    Complete by:  As directed    Do not lift > 20 lbs   No wound care    Complete by:  As directed      Allergies as of 05/16/2017      Reactions   Codeine Phosphate Swelling   Nsaids Other (See Comments)   ulcers      Medication List    STOP taking these medications   GAVISCON 80-14.2 MG Chew Generic drug:  Alum Hydroxide-Mag Trisilicate   metoCLOPramide 5 MG tablet Commonly known as:  REGLAN   oxyCODONE 15 MG immediate release tablet Commonly known as:  ROXICODONE     TAKE these medications   acetaminophen 500 MG tablet Commonly known as:  TYLENOL Take 1,000 mg by mouth every 6 (six) hours as needed for mild pain or moderate pain.   atorvastatin 20 MG tablet Commonly known as:  LIPITOR TAKE 1 TABLET BY MOUTH EVERY DAY   busPIRone 7.5 MG tablet Commonly known as:  BUSPAR TAKE 1 - 2 TABLETS DAILY AS DIRECTED FOR ANXIETY.   CENTRUM SILVER PO Take by mouth daily.   cetirizine 10 MG tablet Commonly known as:  ZYRTEC Take 10 mg by mouth daily.   clotrimazole-betamethasone cream Commonly known as:  LOTRISONE Apply topically 2 (two) times daily.   diltiazem 180 MG 24 hr capsule Commonly known as:  CARDIZEM CD Take 1 capsule (180 mg total) by mouth daily. Start taking on:  05/17/2017   ferrous sulfate 325 (65 FE) MG tablet Take 325 mg by mouth 2 (two) times daily.   fluticasone 50  MCG/ACT nasal spray Commonly known as:  FLONASE Place 2 sprays into both nostrils daily.   latanoprost 0.005 % ophthalmic solution Commonly known as:  XALATAN Place 1 drop into both eyes at bedtime.   metoprolol tartrate 25 MG tablet Commonly known as:  LOPRESSOR Take 1 tablet (25 mg total) by mouth every 8 (eight) hours.   ondansetron 4 MG tablet Commonly known as:  ZOFRAN TAKE 1 TABLET (4 MG TOTAL) BY MOUTH EVERY 8 (EIGHT) HOURS AS NEEDED FOR NAUSEA OR VOMITING.   pantoprazole 40 MG tablet Commonly known as:  PROTONIX Take 1 tablet (40 mg total) by mouth 2 (two) times daily.   polyethylene glycol packet  Commonly known as:  MIRALAX / GLYCOLAX Take 17 g by mouth daily. Start taking on:  05/17/2017   triamcinolone cream 0.5 % Commonly known as:  KENALOG Apply 1 application topically 3 (three) times daily.   valsartan 320 MG tablet Commonly known as:  DIOVAN Take 1 tablet (320 mg total) by mouth daily.      Follow-up Information    Johnathan Hausen, MD. Call in 2 week(s).   Specialty:  General Surgery Why:  We are working on your appointment, please call to confirm. Contact information: [address] [place] [address] [phone]        [facility], [place]. Go on 05/23/2018.   Specialty:  General Surgery Why:  Your appointment is 05/23/17 at 9:30AM with one of our nurses to have your staples removed. Please arrive 30 minutes prior to your appointment to check in and fill out necessary paperwork. Contact information: [address] [place] [phone]       Jacolyn Reedy, MD. Schedule an appointment as soon as possible for a visit in 2 week(s).   Specialty:  Cardiology Why:  Come by the office today or tomorrow to pick up a cardiac event monitor to wear for the next 30 days.  (The office is closed between 12 and 1:30 each day).  Call ahead when you were coming.  Make an appointment to see me  in 2 weeks. Contact information: [address] [place] [address] [phone]        Plotnikov, Evie Lacks, MD. Schedule an appointment as soon as possible for a visit in 1 week(s).   Specialty:  Internal Medicine Contact information: [place] [address] [phone]          Allergies  Allergen Reactions  . Codeine Phosphate Swelling  . Nsaids Other (See Comments)    ulcers    Consultations:  General Surgery  Cardiology    Procedures/Studies: Ct Abdomen Pelvis W Contrast  Result Date: 05/10/2017 CLINICAL DATA:  Generalized abdominal pain EXAM: CT ABDOMEN AND PELVIS WITH CONTRAST TECHNIQUE: Multidetector CT imaging of the abdomen and pelvis was performed using the standard protocol following bolus administration of intravenous contrast. CONTRAST:  57mL ISOVUE-300 IOPAMIDOL (ISOVUE-300) INJECTION 61% COMPARISON:  07/22/2012, 06/29/2009 FINDINGS: Lower chest: Mild scarring is noted in the right lung base. A subpleural nodule is noted on the left best seen on image number 37 of series 7 stable from prior exams dating back to 2010. Hepatobiliary: The liver is within normal limits. The gallbladder is well distended but stable. No cholelithiasis is seen. Pancreas: Unremarkable. No pancreatic ductal dilatation or surrounding inflammatory changes. Spleen: Normal in size without focal abnormality. Adrenals/Urinary Tract: Adrenal glands are within normal limits. Scattered bilateral renal calculi are again seen as are multiple renal cysts. Some slight increase in the bulk of the renal stones is noted. The collecting systems and ureters are within normal limits. The bladder is decompressed. Stomach/Bowel: Scattered diverticular change of the colon is noted. No evidence of diverticulitis is seen. Mild inflammatory changes are noted within the small bowel in the right mid abdomen. Some associated ascites is noted. Venous engorgement is seen. Best visualized  on the coronal imaging there are changes consistent with an internal hernia with the be inflamed loops of small bowel within the hernia. No definitive incarceration is noted although there is some mild dilatation of the more proximal small bowel with a transition  zone at the entrance to the hernia. Prior postsurgical changes of the stomach are noted. Vascular/Lymphatic: Aortic atherosclerosis. No enlarged abdominal or pelvic lymph nodes. Reproductive: Prostate is unremarkable. Musculoskeletal: Postsurgical changes are noted in the lower lumbar spine. No acute bony abnormality is noted. IMPRESSION: 1. Changes of internal hernia within the right mid abdomen with multiple loops of small bowel within. Some venous engorgement is identified although no obstruction of the herniated loops of bowel is noted. There is some more proximal dilatation of the small bowel secondary to the transition zone from the hernia. 2. Mild ascites.  This is likely reactive in nature 3. Chronic changes as described above. Electronically Signed   By: Inez Catalina M.D.   On: 05/10/2017 11:30   Dg Chest Port 1 View  Result Date: 05/10/2017 CLINICAL DATA:  Rule out pneumonia.  Tachycardia, short of breath EXAM: PORTABLE CHEST 1 VIEW COMPARISON:  08/26/2015 FINDINGS: Elevated right hemidiaphragm with right lower lobe atelectasis. Left lung clear. Negative for heart failure IMPRESSION: Right lower lobe atelectasis. Electronically Signed   By: Franchot Gallo M.D.   On: 05/10/2017 11:40    Echo  Study Conclusions  - Left ventricle: The cavity size was normal. Wall thickness was   increased in a pattern of mild LVH. There was moderate focal   basal hypertrophy of the septum. Systolic function was normal.   The estimated ejection fraction was in the range of 60% to 65%.   Doppler parameters are consistent with abnormal left ventricular   relaxation (grade 1 diastolic dysfunction). The E/e&' ratio is   between 8-15 , suggesting  indeterminate LV filling pressure. - Mitral valve: Mildly thickened leaflets . There was mild   regurgitation. - Left atrium: The atrium was normal in size. - Tricuspid valve: There was mild regurgitation. - Pulmonary arteries: PA peak pressure: 47 mm Hg (S). - Inferior vena cava: The vessel was dilated. The respirophasic   diameter changes were blunted (< 50%), consistent with elevated   central venous pressure.  Impressions:  - LVEF 60-65%, mild LVH, moderate focal basal septal hypertrophy,   grade 1 DD, indeterminate LV filling pressure, mild MR, normal LA   size, mild TR, RVSP 47 mmHG, dilated IVC.    Discharge Exam: Vitals:   05/15/17 2154 05/16/17 0527  BP: (!) 151/90 (!) 157/96  Pulse: 77 75  Resp: 18 18  Temp: 98.4 F (36.9 C) 98.7 F (37.1 C)   Vitals:   05/15/17 1407 05/15/17 1453 05/15/17 2154 05/16/17 0527  BP:  132/88 (!) 151/90 (!) 157/96  Pulse: 78 95 77 75  Resp:   18 18  Temp:  98 F (36.7 C) 98.4 F (36.9 C) 98.7 F (37.1 C)  TempSrc:  Oral Oral Oral  SpO2:   95% 96%  Weight:      Height:         General: Pt is alert, awake, not in acute distress Cardiovascular: RRR, S1/S2 +, no rubs, no gallops Respiratory: CTA bilaterally, no wheezing, no rhonchi Abdominal: Soft, NT, ND, bowel sounds +, midline incision clean and intact  Extremities: no edema, no cyanosis    The results of significant diagnostics from this hospitalization (including imaging, microbiology, ancillary and laboratory) are listed below for reference.     Microbiology: Recent Results (from the past 240 hour(s))  Blood Culture (routine x 2)     Status: None   Collection Time: 05/10/17 10:43 AM  Result Value Ref Range Status   Specimen Description BLOOD  BLOOD RIGHT HAND  Final   Special Requests IN PEDIATRIC BOTTLE Blood Culture adequate volume  Final   Culture   Final    NO GROWTH 5 DAYS Performed at [facility], [place] [address] [place], [place] [address]     Report Status 05/15/2017 FINAL  Final  Blood Culture (routine x 2)     Status: None   Collection Time: 05/10/17 12:00 PM  Result Value Ref Range Status   Specimen Description BLOOD BLOOD LEFT HAND  Final   Special Requests   Final    BOTTLES DRAWN AEROBIC AND ANAEROBIC Blood Culture adequate volume   Culture   Final    NO GROWTH 5 DAYS Performed at [facility], [place] [address], [name], [place] [address]    Report Status 05/15/2017 FINAL  Final  Surgical PCR screen     Status: Abnormal   Collection Time: 05/10/17  4:17 PM  Result Value Ref Range Status   MRSA, PCR NEGATIVE NEGATIVE Final   Staphylococcus aureus POSITIVE (A) NEGATIVE Final    Comment:        The Xpert SA Assay (FDA approved for NASAL specimens in patients over 50 years of age), is one component of a comprehensive surveillance program.  Test performance has been validated by [facility] for patients greater than or equal to 72 year old. It is not intended to diagnose infection nor to guide or monitor treatment.      Labs: BNP (last 3 results) No results for input(s): BNP in the last 8760 hours. Basic Metabolic Panel:  Recent Labs Lab 05/10/17 0950  05/12/17 1033 05/13/17 0349 05/14/17 0606 05/15/17 0426 05/16/17 0414  NA  --   < > 142 140 142 141 137  K  --   < > 4.0 3.6 3.2* 3.1* 4.3  CL  --   < > 114* 109 105 103 103  CO2  --   < > 24 25 29 27 25   GLUCOSE  --   < > 128* 113* 109* 115* 111*  BUN  --   < > 22* 15 8 9 10   CREATININE  --   < > 0.92 0.84 0.72 0.73 0.82  CALCIUM  --   < > 8.6* 8.8* 9.2 9.6 9.7  MG 2.3  --   --   --   --   --   --   < > = values in this interval not displayed. Liver Function Tests:  Recent Labs Lab 05/10/17 0912  AST 47*  ALT 21  ALKPHOS 113  BILITOT 0.6  PROT 7.9  ALBUMIN 4.2    Recent Labs Lab 05/10/17 0912  LIPASE 19   No results for input(s): AMMONIA in the last 168 hours. CBC:  Recent Labs Lab 05/10/17 0912  05/11/17 0728  05/12/17 1033 05/13/17 0349 05/14/17 0606 05/15/17 0426  WBC 24.2*  < > 13.2* 9.3 8.1 6.8 7.7  NEUTROABS 21.7*  --   --   --   --   --   --   HGB 17.8*  < > 12.0* 10.9* 11.0* 11.9* 12.3*  HCT 52.3*  < > 36.5* 33.0* 33.9* 35.4* 36.3*  MCV 88.2  < > 87.5 89.2 89.9 85.5 85.6  PLT 347  < > 209 212 238 264 284  < > = values in this interval not displayed. Cardiac Enzymes:  Recent Labs Lab 05/10/17 0920  TROPONINI <0.03   BNP: Invalid input(s): POCBNP CBG:  Recent Labs  Lab 05/11/17 1617 05/15/17 0741 05/15/17 1148 05/15/17 1708 05/15/17 2151  GLUCAP 136* 111* 129* 183* 103*   D-Dimer No results for input(s): DDIMER in the last 72 hours. Hgb A1c No results for input(s): HGBA1C in the last 72 hours. Lipid Profile No results for input(s): CHOL, HDL, LDLCALC, TRIG, CHOLHDL, LDLDIRECT in the last 72 hours. Thyroid function studies No results for input(s): TSH, T4TOTAL, T3FREE, THYROIDAB in the last 72 hours.  Invalid input(s): FREET3 Anemia work up No results for input(s): VITAMINB12, FOLATE, FERRITIN, TIBC, IRON, RETICCTPCT in the last 72 hours. Urinalysis    Component Value Date/Time   COLORURINE YELLOW 05/10/2017 1414   APPEARANCEUR HAZY (A) 05/10/2017 1414   LABSPEC >1.046 (H) 05/10/2017 1414   PHURINE 5.0 05/10/2017 1414   GLUCOSEU NEGATIVE 05/10/2017 1414   GLUCOSEU NEGATIVE 06/12/2016 0833   HGBUR NEGATIVE 05/10/2017 1414   BILIRUBINUR NEGATIVE 05/10/2017 1414   KETONESUR NEGATIVE 05/10/2017 1414   PROTEINUR 30 (A) 05/10/2017 1414   UROBILINOGEN 0.2 06/12/2016 0833   NITRITE NEGATIVE 05/10/2017 1414   LEUKOCYTESUR NEGATIVE 05/10/2017 1414   Sepsis Labs Invalid input(s): PROCALCITONIN,  WBC,  LACTICIDVEN Microbiology Recent Results (from the past 240 hour(s))  Blood Culture (routine x 2)     Status: None   Collection Time: 05/10/17 10:43 AM  Result Value Ref Range Status   Specimen Description BLOOD BLOOD RIGHT HAND  Final   Special Requests IN  PEDIATRIC BOTTLE Blood Culture adequate volume  Final   Culture   Final    NO GROWTH 5 DAYS Performed at [facility], [place] [address], [place], [place] [address]    Report Status 05/15/2017 FINAL  Final  Blood Culture (routine x 2)     Status: None   Collection Time: 05/10/17 12:00 PM  Result Value Ref Range Status   Specimen Description BLOOD BLOOD LEFT HAND  Final   Special Requests   Final    BOTTLES DRAWN AEROBIC AND ANAEROBIC Blood Culture adequate volume   Culture   Final    NO GROWTH 5 DAYS Performed at [facility], [place] [address] [place], [place] [address]    Report Status 05/15/2017 FINAL  Final  Surgical PCR screen     Status: Abnormal   Collection Time: 05/10/17  4:17 PM  Result Value Ref Range Status   MRSA, PCR NEGATIVE NEGATIVE Final   Staphylococcus aureus POSITIVE (A) NEGATIVE Final    Comment:        The Xpert SA Assay (FDA approved for NASAL specimens in patients over 54 years of age), is one component of a comprehensive surveillance program.  Test performance has been validated by [facility] for patients greater than or equal to 42 year old. It is not intended to diagnose infection nor to guide or monitor treatment.      Time coordinating discharge: 45 minutes  SIGNED:  Dessa Phi, DO [facility] Pager [phone]  If 7PM-7AM, please contact night-coverage [email or web address] Password [email or web address] 05/16/2017, 9:31 AM

## 2017-05-16 NOTE — Progress Notes (Signed)
Subjective:  He feels good this morning and is anxious to go home.  Did have some recurrence of atrial fibrillation yesterday morning but after receiving metoprolol this appeared to stop.  Objective:  Vital Signs in the last 24 hours: BP (!) 157/96 (BP Location: Left Arm)   Pulse 75   Temp 98.7 F (37.1 C) (Oral)   Resp 18   Ht 5\' 6"  (1.676 m)   Wt 97.5 kg (215 lb)   SpO2 96%   BMI 34.70 kg/m   Physical Exam: Pleasant male in no acute distress Lungs:  Clear Cardiac:  Regular rhythm, normal S1 and S2, no S3 Extremities:  No edema present  Intake/Output from previous day: 07/04 0701 - 07/05 0700 In: 720 [P.O.:720] Out: 1600 [Urine:1600]  Weight Filed Weights   05/10/17 0846  Weight: 97.5 kg (215 lb)    Lab Results: Basic Metabolic Panel:  Recent Labs  05/15/17 0426 05/16/17 0414  NA 141 137  K 3.1* 4.3  CL 103 103  CO2 27 25  GLUCOSE 115* 111*  BUN 9 10  CREATININE 0.73 0.82   CBC:  Recent Labs  05/14/17 0606 05/15/17 0426  WBC 6.8 7.7  HGB 11.9* 12.3*  HCT 35.4* 36.3*  MCV 85.5 85.6  PLT 264 284   Telemetry: Reviewed Personally.  He is currently in sinus rhythm and did not have atrial fibrillation after his first dose of metoprolol yesterday.  Prior to that he was having some runs of paroxysmal atrial fibrillation.  Assessment/Plan:  1.  Paroxysmal atrial fibrillation appears to be coming under better control with the addition of beta blocker 2.  Hypertensive heart disease 3.  Recent surgery for small bowel obstruction 4.  History of large ulcer with previous Billroth surgery no bleeding since then  Recommendations:  He is okay to go home today.  I would suggest that he come by the office and were a cardiac event monitor.  I like to give him a few more days to recover from his surgery but if he continues to have atrial fibrillation I would like to try him back on anticoagulation.  He should be discharged on metoprolol 25 mg every 8 hours and  sustained release diltiazem 180 mg in the morning.  I would like to see him in follow-up in 2 weeks.     Kerry Hough  MD [facility]  05/16/2017, 9:04 AM

## 2017-05-16 NOTE — Telephone Encounter (Signed)
Transition Care Management Follow-up Telephone Call   Date discharged? 05/16/17   How have you been since you were released from the hospital? Pt states he is feeling alright just really got home   Do you understand why you were in the hospital? YES   Do you understand the discharge instructions? YES   Where were you discharged to? Home   Items Reviewed:  Medications reviewed: YES  Allergies reviewed: YES  Dietary changes reviewed: YES, heart healthy  Referrals reviewed: YES   Functional Questionnaire:   Activities of Daily Living (ADLs):   He states he are independent in the following: bathing and hygiene, feeding, continence, grooming, toileting and dressing States hey require assistance with the following: ambulation   Any transportation issues/concerns?: NO   Any patient concerns? NO   Confirmed importance and date/time of follow-up visits scheduled YES, appt 05/22/17 Provider Appointment booked with Dr. Alain [facility]  Confirmed with patient if condition begins to worsen call PCP or go to the ER.  Patient was given the office number and encouraged to call back with question or concerns.  : YES

## 2017-05-16 NOTE — Plan of Care (Signed)
Problem: Pain Managment: Goal: General experience of comfort will improve Outcome: Progressing PO pain meds appear to be sufficient control for pts pain.

## 2017-05-17 DIAGNOSIS — I480 Paroxysmal atrial fibrillation: Secondary | ICD-10-CM | POA: Diagnosis not present

## 2017-05-17 DIAGNOSIS — R0600 Dyspnea, unspecified: Secondary | ICD-10-CM | POA: Diagnosis not present

## 2017-05-20 DIAGNOSIS — I480 Paroxysmal atrial fibrillation: Secondary | ICD-10-CM | POA: Diagnosis not present

## 2017-05-20 DIAGNOSIS — R0602 Shortness of breath: Secondary | ICD-10-CM | POA: Diagnosis not present

## 2017-05-20 DIAGNOSIS — K219 Gastro-esophageal reflux disease without esophagitis: Secondary | ICD-10-CM | POA: Diagnosis not present

## 2017-05-20 DIAGNOSIS — E785 Hyperlipidemia, unspecified: Secondary | ICD-10-CM | POA: Diagnosis not present

## 2017-05-22 ENCOUNTER — Encounter: Payer: Self-pay | Admitting: Internal Medicine

## 2017-05-22 ENCOUNTER — Other Ambulatory Visit (INDEPENDENT_AMBULATORY_CARE_PROVIDER_SITE_OTHER): Payer: Medicare Other

## 2017-05-22 ENCOUNTER — Ambulatory Visit (INDEPENDENT_AMBULATORY_CARE_PROVIDER_SITE_OTHER): Payer: Medicare Other | Admitting: Internal Medicine

## 2017-05-22 VITALS — BP 114/68 | HR 73 | Temp 98.7°F | Ht 66.0 in | Wt 206.0 lb

## 2017-05-22 DIAGNOSIS — K56609 Unspecified intestinal obstruction, unspecified as to partial versus complete obstruction: Secondary | ICD-10-CM

## 2017-05-22 DIAGNOSIS — M519 Unspecified thoracic, thoracolumbar and lumbosacral intervertebral disc disorder: Secondary | ICD-10-CM | POA: Diagnosis not present

## 2017-05-22 DIAGNOSIS — I119 Hypertensive heart disease without heart failure: Secondary | ICD-10-CM

## 2017-05-22 DIAGNOSIS — I480 Paroxysmal atrial fibrillation: Secondary | ICD-10-CM | POA: Diagnosis not present

## 2017-05-22 LAB — CBC WITH DIFFERENTIAL/PLATELET
Basophils Absolute: 0.1 10*3/uL (ref 0.0–0.1)
Basophils Relative: 0.6 % (ref 0.0–3.0)
Eosinophils Absolute: 0.1 10*3/uL (ref 0.0–0.7)
Eosinophils Relative: 1.2 % (ref 0.0–5.0)
HCT: 37.9 % — ABNORMAL LOW (ref 39.0–52.0)
Hemoglobin: 12.8 g/dL — ABNORMAL LOW (ref 13.0–17.0)
Lymphocytes Relative: 11.7 % — ABNORMAL LOW (ref 12.0–46.0)
Lymphs Abs: 1.3 10*3/uL (ref 0.7–4.0)
MCHC: 33.7 g/dL (ref 30.0–36.0)
MCV: 87.9 fl (ref 78.0–100.0)
Monocytes Absolute: 0.9 10*3/uL (ref 0.1–1.0)
Monocytes Relative: 8.1 % (ref 3.0–12.0)
Neutro Abs: 8.5 10*3/uL — ABNORMAL HIGH (ref 1.4–7.7)
Neutrophils Relative %: 78.4 % — ABNORMAL HIGH (ref 43.0–77.0)
Platelets: 372 10*3/uL (ref 150.0–400.0)
RBC: 4.31 Mil/uL (ref 4.22–5.81)
RDW: 16.7 % — ABNORMAL HIGH (ref 11.5–15.5)
WBC: 10.9 10*3/uL — ABNORMAL HIGH (ref 4.0–10.5)

## 2017-05-22 LAB — BASIC METABOLIC PANEL
BUN: 19 mg/dL (ref 6–23)
CO2: 23 mEq/L (ref 19–32)
Calcium: 10.4 mg/dL (ref 8.4–10.5)
Chloride: 105 mEq/L (ref 96–112)
Creatinine, Ser: 1.28 mg/dL (ref 0.40–1.50)
GFR: 58.77 mL/min — ABNORMAL LOW (ref >60.00)
Glucose, Bld: 129 mg/dL — ABNORMAL HIGH (ref 70–99)
Potassium: 4.2 mEq/L (ref 3.5–5.1)
Sodium: 139 mEq/L (ref 135–145)

## 2017-05-22 MED ORDER — OXYCODONE HCL 15 MG PO TABS: 15.0000 mg | ORAL_TABLET | Freq: Four times a day (QID) | ORAL | 0 refills | Status: DC | PRN

## 2017-05-22 NOTE — Assessment & Plan Note (Signed)
S/p Laparotomy with extensive enteral lysis 6/29

## 2017-05-22 NOTE — Assessment & Plan Note (Signed)
Labs Losartan

## 2017-05-22 NOTE — Assessment & Plan Note (Signed)
Oxycodone   Potential benefits of a long term opioids use as well as potential risks (i.e. addiction risk, apnea etc) and complications (i.e. Somnolence, constipation and others) were explained to the patient and were aknowledged. 

## 2017-05-22 NOTE — Assessment & Plan Note (Signed)
On a monitor NSR now

## 2017-05-22 NOTE — Progress Notes (Signed)
Subjective:  Patient ID: Cody Frazier, male    DOB: 28-May-1946  Age: 71 y.o. MRN: [id number]  CC: No chief complaint on file.   HPI Cody Frazier presents for a post-hospital follow up for SBO, hernia, lysis (6/29-05/16/17). He is s/p laparotomy w/intestinal lysis. He had A fib - resolved.   Per d/c:  "Brief/Interim Summary: Cody Frazier a 71 y.o.malewith medical history significant of HTN, paroxysmal A. fib, PUD, GERD; who presented with complaints of right-sided abdominal pain with nausea and vomiting Wednesday night (6/27). Symptoms started after he ate food that had been sitting out for over 3 hours. Patient was awoken out of sleep due to symptoms. Pain is constant and sharp. He's had multiple episodes of emesis which were noted to be nonbloody. He reports that he still able to pass flatus and has been belching a lot, but his last bowel movement was 2 days ago. While waiting in the waiting room in the ED, patient had what was reported to be a seizure-like episode where his eyes rolled up and he had a blank stare and was diaphoretic for which he was taken back immediately for further evaluation. On initial EKG patient was noted to be in A. fib with RVR 187 bpm,but heart rate improved with IV fluids. CT scan of the abdomen showed signs of possible internal hernia. Gen. surgery was consulted. Patient underwent exploratory laparotomy with enteral lysis of adhesions 6/29.   Internal abdominal hernia -S/p Laparotomy with extensive enteral lysis 6/29  -Per general surgery. Doing well, diet advanced, will need to follow-up for staple removal next week and follow-up with with Dr. Hassell Done in 2 weeks   A. fib with RVR -Chadsvasc=3. Patient previously noted of going into A. fib with RVR postop. Has seen cardiology (Dr. Percival Spanish) after last episode after back surgery and was not started on anticoagulation. Went into A Fib RVR post-operatively.  -Echo completed, EF 56%, grade 1 diastolic  dysfunction, mild LVH -Cardiology consulted due to recurrent A Fib RVR episodes. Continue cardizem, metoprolol. Follow up for outpatient cardiac monitor and with Dr. Wynonia Lawman in 2 weeks to discuss further anticoagulation recommendations.  Essential hypertension -Continue ARB   Acute kidney injury secondary to severe dehydration -Patient's baseline creatinine was previously 1.1- 1.17 -Resolved with IVF   GERD -Protonix "   Outpatient Medications Prior to Visit  Medication Sig Dispense Refill  . acetaminophen (TYLENOL) 500 MG tablet Take 1,000 mg by mouth every 6 (six) hours as needed for mild pain or moderate pain.    Marland Kitchen atorvastatin (LIPITOR) 20 MG tablet TAKE 1 TABLET BY MOUTH EVERY DAY 90 tablet 3  . busPIRone (BUSPAR) 7.5 MG tablet TAKE 1 - 2 TABLETS DAILY AS DIRECTED FOR ANXIETY. 120 tablet 5  . cetirizine (ZYRTEC) 10 MG tablet Take 10 mg by mouth daily.    . clotrimazole-betamethasone (LOTRISONE) cream Apply topically 2 (two) times daily. 60 g 1  . diltiazem (CARDIZEM CD) 180 MG 24 hr capsule Take 1 capsule (180 mg total) by mouth daily. 30 capsule 0  . ferrous sulfate 325 (65 FE) MG tablet Take 325 mg by mouth 2 (two) times daily.      . fluticasone (FLONASE) 50 MCG/ACT nasal spray Place 2 sprays into both nostrils daily. 16 g 6  . latanoprost (XALATAN) 0.005 % ophthalmic solution Place 1 drop into both eyes at bedtime.  11  . metoCLOPramide (REGLAN) 5 MG tablet TAKE 1 TABLET (5 MG TOTAL) BY MOUTH EVERY 8 (EIGHT)  HOURS AS NEEDED FOR NAUSEA. 90 tablet 0  . metoprolol tartrate (LOPRESSOR) 25 MG tablet Take 1 tablet (25 mg total) by mouth every 8 (eight) hours. 90 tablet 0  . Multiple Vitamins-Minerals (CENTRUM SILVER PO) Take by mouth daily.      . ondansetron (ZOFRAN) 4 MG tablet TAKE 1 TABLET (4 MG TOTAL) BY MOUTH EVERY 8 (EIGHT) HOURS AS NEEDED FOR NAUSEA OR VOMITING. 20 tablet 0  . pantoprazole (PROTONIX) 40 MG tablet Take 1 tablet (40 mg total) by mouth 2 (two) times daily. 90  tablet 3  . polyethylene glycol (MIRALAX / GLYCOLAX) packet Take 17 g by mouth daily. 14 each 0  . triamcinolone cream (KENALOG) 0.5 % Apply 1 application topically 3 (three) times daily. 90 g 1  . valsartan (DIOVAN) 320 MG tablet Take 1 tablet (320 mg total) by mouth daily. 90 tablet 3   Facility-Administered Medications Prior to Visit  Medication Dose Route Frequency Provider Last Rate Last Dose  . 0.9 %  sodium chloride infusion  500 mL Intravenous Continuous Ladene Artist, MD        ROS Review of Systems  Constitutional: Negative for appetite change, fatigue and unexpected weight change.  HENT: Negative for congestion, nosebleeds, sneezing, sore throat and trouble swallowing.   Eyes: Negative for itching and visual disturbance.  Respiratory: Negative for cough.   Cardiovascular: Negative for chest pain, palpitations and leg swelling.  Gastrointestinal: Negative for abdominal distention, abdominal pain, blood in stool, diarrhea and nausea.  Genitourinary: Negative for frequency and hematuria.  Musculoskeletal: Negative for back pain, gait problem, joint swelling and neck pain.  Skin: Negative for rash.  Neurological: Negative for dizziness, tremors, speech difficulty and weakness.  Psychiatric/Behavioral: Negative for agitation, dysphoric mood and sleep disturbance. The patient is not nervous/anxious.     Objective:  BP 114/68 (BP Location: Left Arm, Patient Position: Sitting, Cuff Size: Large)   Pulse 73   Temp 98.7 F (37.1 C) (Oral)   Ht 5\' 6"  (1.676 m)   Wt 206 lb (93.4 kg)   SpO2 94%   BMI 33.25 kg/m   BP Readings from Last 3 Encounters:  05/22/17 114/68  05/16/17 (!) 157/96  05/03/17 118/80    Wt Readings from Last 3 Encounters:  05/22/17 206 lb (93.4 kg)  05/10/17 215 lb (97.5 kg)  05/03/17 214 lb (97.1 kg)    Physical Exam  Constitutional: He is oriented to person, place, and time. He appears well-developed. No distress.  NAD  HENT:  Mouth/Throat:  Oropharynx is clear and moist.  Eyes: Conjunctivae are normal. Pupils are equal, round, and reactive to light.  Neck: Normal range of motion. No JVD present. No thyromegaly present.  Cardiovascular: Normal rate, regular rhythm, normal heart sounds and intact distal pulses.  Exam reveals no gallop and no friction rub.   No murmur heard. Pulmonary/Chest: Effort normal and breath sounds normal. No respiratory distress. He has no wheezes. He has no rales. He exhibits no tenderness.  Abdominal: Soft. Bowel sounds are normal. He exhibits no distension and no mass. There is tenderness. There is no rebound and no guarding.  Musculoskeletal: Normal range of motion. He exhibits no edema or tenderness.  Lymphadenopathy:    He has no cervical adenopathy.  Neurological: He is alert and oriented to person, place, and time. He has normal reflexes. No cranial nerve deficit. He exhibits normal muscle tone. He displays a negative Romberg sign. Coordination and gait normal.  Skin: Skin is warm and dry.  No rash noted.  Psychiatric: He has a normal mood and affect. His behavior is normal. Judgment and thought content normal.  scar is healing  Lab Results  Component Value Date   WBC 7.7 05/15/2017   HGB 12.3 (L) 05/15/2017   HCT 36.3 (L) 05/15/2017   PLT 284 05/15/2017   GLUCOSE 111 (H) 05/16/2017   CHOL 199 02/09/2016   TRIG 186.0 (H) 02/09/2016   HDL 50.20 02/09/2016   LDLDIRECT 171.0 05/20/2015   LDLCALC 112 (H) 02/09/2016   ALT 21 05/10/2017   AST 47 (H) 05/10/2017   NA 137 05/16/2017   K 4.3 05/16/2017   CL 103 05/16/2017   CREATININE 0.82 05/16/2017   BUN 10 05/16/2017   CO2 25 05/16/2017   TSH 1.401 05/10/2017   PSA 0.12 02/09/2016   INR 1.11 05/10/2017   HGBA1C 5.7 11/13/2016    Ct Abdomen Pelvis W Contrast  Result Date: 05/10/2017 CLINICAL DATA:  Generalized abdominal pain EXAM: CT ABDOMEN AND PELVIS WITH CONTRAST TECHNIQUE: Multidetector CT imaging of the abdomen and pelvis was  performed using the standard protocol following bolus administration of intravenous contrast. CONTRAST:  45mL ISOVUE-300 IOPAMIDOL (ISOVUE-300) INJECTION 61% COMPARISON:  07/22/2012, 06/29/2009 FINDINGS: Lower chest: Mild scarring is noted in the right lung base. A subpleural nodule is noted on the left best seen on image number 37 of series 7 stable from prior exams dating back to 2010. Hepatobiliary: The liver is within normal limits. The gallbladder is well distended but stable. No cholelithiasis is seen. Pancreas: Unremarkable. No pancreatic ductal dilatation or surrounding inflammatory changes. Spleen: Normal in size without focal abnormality. Adrenals/Urinary Tract: Adrenal glands are within normal limits. Scattered bilateral renal calculi are again seen as are multiple renal cysts. Some slight increase in the bulk of the renal stones is noted. The collecting systems and ureters are within normal limits. The bladder is decompressed. Stomach/Bowel: Scattered diverticular change of the colon is noted. No evidence of diverticulitis is seen. Mild inflammatory changes are noted within the small bowel in the right mid abdomen. Some associated ascites is noted. Venous engorgement is seen. Best visualized on the coronal imaging there are changes consistent with an internal hernia with the be inflamed loops of small bowel within the hernia. No definitive incarceration is noted although there is some mild dilatation of the more proximal small bowel with a transition zone at the entrance to the hernia. Prior postsurgical changes of the stomach are noted. Vascular/Lymphatic: Aortic atherosclerosis. No enlarged abdominal or pelvic lymph nodes. Reproductive: Prostate is unremarkable. Musculoskeletal: Postsurgical changes are noted in the lower lumbar spine. No acute bony abnormality is noted. IMPRESSION: 1. Changes of internal hernia within the right mid abdomen with multiple loops of small bowel within. Some venous  engorgement is identified although no obstruction of the herniated loops of bowel is noted. There is some more proximal dilatation of the small bowel secondary to the transition zone from the hernia. 2. Mild ascites.  This is likely reactive in nature 3. Chronic changes as described above. Electronically Signed   By: Inez Catalina M.D.   On: 05/10/2017 11:30   Dg Chest Port 1 View  Result Date: 05/10/2017 CLINICAL DATA:  Rule out pneumonia.  Tachycardia, short of breath EXAM: PORTABLE CHEST 1 VIEW COMPARISON:  08/26/2015 FINDINGS: Elevated right hemidiaphragm with right lower lobe atelectasis. Left lung clear. Negative for heart failure IMPRESSION: Right lower lobe atelectasis. Electronically Signed   By: Franchot Gallo M.D.   On: 05/10/2017 11:40  Assessment & Plan:   There are no diagnoses linked to this encounter. I am having Mr. Matus maintain his Multiple Vitamins-Minerals (CENTRUM SILVER PO), ferrous sulfate, ondansetron, atorvastatin, clotrimazole-betamethasone, triamcinolone cream, valsartan, fluticasone, busPIRone, pantoprazole, acetaminophen, cetirizine, latanoprost, metoCLOPramide, diltiazem, metoprolol tartrate, and polyethylene glycol. We will continue to administer sodium chloride.  No orders of the defined types were placed in this encounter.    Follow-up: No Follow-up on file.  Walker Kehr, MD

## 2017-05-30 DIAGNOSIS — I480 Paroxysmal atrial fibrillation: Secondary | ICD-10-CM | POA: Diagnosis not present

## 2017-06-04 ENCOUNTER — Telehealth: Payer: Self-pay | Admitting: Internal Medicine

## 2017-06-04 NOTE — Telephone Encounter (Signed)
Patient calling in to request med in place of valsaran.  Patient would like to know if he needs to go back on losartan.  States that his bp ran a little high on this medication.   [facility] [address]

## 2017-06-05 MED ORDER — TELMISARTAN 80 MG PO TABS: 80.0000 mg | ORAL_TABLET | Freq: Every day | ORAL | 3 refills | Status: DC

## 2017-06-05 NOTE — Telephone Encounter (Signed)
OK Micardis Thx

## 2017-06-06 NOTE — Telephone Encounter (Signed)
Notified pt w/MD response [facility].../lmb

## 2017-07-02 ENCOUNTER — Encounter (HOSPITAL_BASED_OUTPATIENT_CLINIC_OR_DEPARTMENT_OTHER): Payer: Medicare Other

## 2017-07-10 ENCOUNTER — Other Ambulatory Visit: Payer: Self-pay | Admitting: Internal Medicine

## 2017-07-12 DIAGNOSIS — H401212 Low-tension glaucoma, right eye, moderate stage: Secondary | ICD-10-CM | POA: Diagnosis not present

## 2017-07-12 DIAGNOSIS — H25813 Combined forms of age-related cataract, bilateral: Secondary | ICD-10-CM | POA: Diagnosis not present

## 2017-07-12 DIAGNOSIS — H40002 Preglaucoma, unspecified, left eye: Secondary | ICD-10-CM | POA: Diagnosis not present

## 2017-08-23 ENCOUNTER — Encounter: Payer: Self-pay | Admitting: Internal Medicine

## 2017-08-23 ENCOUNTER — Ambulatory Visit (INDEPENDENT_AMBULATORY_CARE_PROVIDER_SITE_OTHER): Payer: Medicare Other | Admitting: Internal Medicine

## 2017-08-23 VITALS — BP 128/84 | HR 61 | Temp 98.4°F | Ht 66.0 in | Wt 205.0 lb

## 2017-08-23 DIAGNOSIS — Z23 Encounter for immunization: Secondary | ICD-10-CM

## 2017-08-23 DIAGNOSIS — F411 Generalized anxiety disorder: Secondary | ICD-10-CM

## 2017-08-23 DIAGNOSIS — E785 Hyperlipidemia, unspecified: Secondary | ICD-10-CM

## 2017-08-23 DIAGNOSIS — I119 Hypertensive heart disease without heart failure: Secondary | ICD-10-CM

## 2017-08-23 DIAGNOSIS — M519 Unspecified thoracic, thoracolumbar and lumbosacral intervertebral disc disorder: Secondary | ICD-10-CM | POA: Diagnosis not present

## 2017-08-23 MED ORDER — OXYCODONE HCL 15 MG PO TABS: 15.0000 mg | ORAL_TABLET | Freq: Four times a day (QID) | ORAL | 0 refills | Status: DC | PRN

## 2017-08-23 NOTE — Assessment & Plan Note (Signed)
Losartan 

## 2017-08-23 NOTE — Assessment & Plan Note (Signed)
On Lipitor 

## 2017-08-23 NOTE — Addendum Note (Signed)
Addended by: Karren Cobble on: 08/23/2017 01:44 PM   Modules accepted: Orders

## 2017-08-23 NOTE — Progress Notes (Signed)
Subjective:  Patient ID: Cody Frazier, male    DOB: [birth year]  Age: 71 y.o. MRN: [id number]  CC: No chief complaint on file.   HPI Cody Frazier presents for LBP, PUD, HTN, CAD f/u  Outpatient Medications Prior to Visit  Medication Sig Dispense Refill  . acetaminophen (TYLENOL) 500 MG tablet Take 1,000 mg by mouth every 6 (six) hours as needed for mild pain or moderate pain.    Marland Kitchen atorvastatin (LIPITOR) 20 MG tablet TAKE 1 TABLET BY MOUTH EVERY DAY 90 tablet 3  . busPIRone (BUSPAR) 7.5 MG tablet TAKE 1 - 2 TABLETS DAILY AS DIRECTED FOR ANXIETY. 120 tablet 5  . cetirizine (ZYRTEC) 10 MG tablet Take 10 mg by mouth daily.    . clotrimazole-betamethasone (LOTRISONE) cream Apply topically 2 (two) times daily. 60 g 1  . diltiazem (CARDIZEM CD) 180 MG 24 hr capsule Take 1 capsule (180 mg total) by mouth daily. 30 capsule 0  . ferrous sulfate 325 (65 FE) MG tablet Take 325 mg by mouth 2 (two) times daily.      . fluticasone (FLONASE) 50 MCG/ACT nasal spray Place 2 sprays into both nostrils daily. 16 g 6  . latanoprost (XALATAN) 0.005 % ophthalmic solution Place 1 drop into both eyes at bedtime.  11  . metoCLOPramide (REGLAN) 5 MG tablet TAKE 1 TABLET (5 MG TOTAL) BY MOUTH EVERY 8 (EIGHT) HOURS AS NEEDED FOR NAUSEA. 90 tablet 0  . metoprolol tartrate (LOPRESSOR) 25 MG tablet Take 1 tablet (25 mg total) by mouth every 8 (eight) hours. 90 tablet 0  . Multiple Vitamins-Minerals (CENTRUM SILVER PO) Take by mouth daily.      . ondansetron (ZOFRAN) 4 MG tablet TAKE 1 TABLET (4 MG TOTAL) BY MOUTH EVERY 8 (EIGHT) HOURS AS NEEDED FOR NAUSEA OR VOMITING. 20 tablet 0  . oxyCODONE (ROXICODONE) 15 MG immediate release tablet Take 1 tablet (15 mg total) by mouth 4 (four) times daily as needed for pain. For pain - Fill on or after 07/26/17 Please fill every 30 days 120 tablet 0  . pantoprazole (PROTONIX) 40 MG tablet Take 1 tablet (40 mg total) by mouth 2 (two) times daily. 90 tablet 3  . polyethylene  glycol (MIRALAX / GLYCOLAX) packet Take 17 g by mouth daily. 14 each 0  . telmisartan (MICARDIS) 80 MG tablet Take 1 tablet (80 mg total) by mouth daily. 90 tablet 3  . triamcinolone cream (KENALOG) 0.5 % Apply 1 application topically 3 (three) times daily. 90 g 1   Facility-Administered Medications Prior to Visit  Medication Dose Route Frequency Provider Last Rate Last Dose  . 0.9 %  sodium chloride infusion  500 mL Intravenous Continuous Ladene Artist, MD        ROS Review of Systems  Constitutional: Negative for appetite change, fatigue and unexpected weight change.  HENT: Negative for congestion, nosebleeds, sneezing, sore throat and trouble swallowing.   Eyes: Negative for itching and visual disturbance.  Respiratory: Negative for cough.   Cardiovascular: Negative for chest pain, palpitations and leg swelling.  Gastrointestinal: Negative for abdominal distention, blood in stool, diarrhea and nausea.  Genitourinary: Negative for frequency and hematuria.  Musculoskeletal: Positive for back pain. Negative for gait problem, joint swelling and neck pain.  Skin: Negative for rash.  Neurological: Negative for dizziness, tremors, speech difficulty and weakness.  Psychiatric/Behavioral: Negative for agitation, dysphoric mood and sleep disturbance. The patient is not nervous/anxious.     Objective:  BP 128/84 (BP Location:  Left Arm, Patient Position: Sitting, Cuff Size: Large)   Pulse 61   Temp 98.4 F (36.9 C) (Oral)   Ht 5\' 6"  (1.676 m)   Wt 205 lb (93 kg)   SpO2 99%   BMI 33.09 kg/m   BP Readings from Last 3 Encounters:  08/23/17 128/84  05/22/17 114/68  05/16/17 (!) 157/96    Wt Readings from Last 3 Encounters:  08/23/17 205 lb (93 kg)  05/22/17 206 lb (93.4 kg)  05/10/17 215 lb (97.5 kg)    Physical Exam  Constitutional: He is oriented to person, place, and time. He appears well-developed. No distress.  NAD  HENT:  Mouth/Throat: Oropharynx is clear and moist.    Eyes: Pupils are equal, round, and reactive to light. Conjunctivae are normal.  Neck: Normal range of motion. No JVD present. No thyromegaly present.  Cardiovascular: Normal rate, regular rhythm, normal heart sounds and intact distal pulses.  Exam reveals no gallop and no friction rub.   No murmur heard. Pulmonary/Chest: Effort normal and breath sounds normal. No respiratory distress. He has no wheezes. He has no rales. He exhibits no tenderness.  Abdominal: Soft. Bowel sounds are normal. He exhibits no distension and no mass. There is no tenderness. There is no rebound and no guarding.  Musculoskeletal: Normal range of motion. He exhibits no edema or tenderness.  Lymphadenopathy:    He has no cervical adenopathy.  Neurological: He is alert and oriented to person, place, and time. He has normal reflexes. No cranial nerve deficit. He exhibits normal muscle tone. He displays a negative Romberg sign. Coordination and gait normal.  Skin: Skin is warm and dry. No rash noted.  Psychiatric: He has a normal mood and affect. His behavior is normal. Judgment and thought content normal.    Lab Results  Component Value Date   WBC 10.9 (H) 05/22/2017   HGB 12.8 (L) 05/22/2017   HCT 37.9 (L) 05/22/2017   PLT 372.0 05/22/2017   GLUCOSE 129 (H) 05/22/2017   CHOL 199 02/09/2016   TRIG 186.0 (H) 02/09/2016   HDL 50.20 02/09/2016   LDLDIRECT 171.0 05/20/2015   LDLCALC 112 (H) 02/09/2016   ALT 21 05/10/2017   AST 47 (H) 05/10/2017   NA 139 05/22/2017   K 4.2 05/22/2017   CL 105 05/22/2017   CREATININE 1.28 05/22/2017   BUN 19 05/22/2017   CO2 23 05/22/2017   TSH 1.401 05/10/2017   PSA 0.12 02/09/2016   INR 1.11 05/10/2017   HGBA1C 5.7 11/13/2016    Ct Abdomen Pelvis W Contrast  Result Date: 05/10/2017 CLINICAL DATA:  Generalized abdominal pain EXAM: CT ABDOMEN AND PELVIS WITH CONTRAST TECHNIQUE: Multidetector CT imaging of the abdomen and pelvis was performed using the standard protocol  following bolus administration of intravenous contrast. CONTRAST:  38mL ISOVUE-300 IOPAMIDOL (ISOVUE-300) INJECTION 61% COMPARISON:  07/22/2012, 06/29/2009 FINDINGS: Lower chest: Mild scarring is noted in the right lung base. A subpleural nodule is noted on the left best seen on image number 37 of series 7 stable from prior exams dating back to 2010. Hepatobiliary: The liver is within normal limits. The gallbladder is well distended but stable. No cholelithiasis is seen. Pancreas: Unremarkable. No pancreatic ductal dilatation or surrounding inflammatory changes. Spleen: Normal in size without focal abnormality. Adrenals/Urinary Tract: Adrenal glands are within normal limits. Scattered bilateral renal calculi are again seen as are multiple renal cysts. Some slight increase in the bulk of the renal stones is noted. The collecting systems and ureters are  within normal limits. The bladder is decompressed. Stomach/Bowel: Scattered diverticular change of the colon is noted. No evidence of diverticulitis is seen. Mild inflammatory changes are noted within the small bowel in the right mid abdomen. Some associated ascites is noted. Venous engorgement is seen. Best visualized on the coronal imaging there are changes consistent with an internal hernia with the be inflamed loops of small bowel within the hernia. No definitive incarceration is noted although there is some mild dilatation of the more proximal small bowel with a transition zone at the entrance to the hernia. Prior postsurgical changes of the stomach are noted. Vascular/Lymphatic: Aortic atherosclerosis. No enlarged abdominal or pelvic lymph nodes. Reproductive: Prostate is unremarkable. Musculoskeletal: Postsurgical changes are noted in the lower lumbar spine. No acute bony abnormality is noted. IMPRESSION: 1. Changes of internal hernia within the right mid abdomen with multiple loops of small bowel within. Some venous engorgement is identified although no  obstruction of the herniated loops of bowel is noted. There is some more proximal dilatation of the small bowel secondary to the transition zone from the hernia. 2. Mild ascites.  This is likely reactive in nature 3. Chronic changes as described above. Electronically Signed   By: Inez Catalina M.D.   On: 05/10/2017 11:30   Dg Chest Port 1 View  Result Date: 05/10/2017 CLINICAL DATA:  Rule out pneumonia.  Tachycardia, short of breath EXAM: PORTABLE CHEST 1 VIEW COMPARISON:  08/26/2015 FINDINGS: Elevated right hemidiaphragm with right lower lobe atelectasis. Left lung clear. Negative for heart failure IMPRESSION: Right lower lobe atelectasis. Electronically Signed   By: Franchot Gallo M.D.   On: 05/10/2017 11:40    Assessment & Plan:   There are no diagnoses linked to this encounter. I am having Mr. Sara maintain his Multiple Vitamins-Minerals (CENTRUM SILVER PO), ferrous sulfate, ondansetron, clotrimazole-betamethasone, triamcinolone cream, fluticasone, busPIRone, pantoprazole, acetaminophen, cetirizine, latanoprost, metoCLOPramide, diltiazem, metoprolol tartrate, polyethylene glycol, oxyCODONE, telmisartan, and atorvastatin. We will continue to administer sodium chloride.  No orders of the defined types were placed in this encounter.    Follow-up: No Follow-up on file.  Walker Kehr, MD

## 2017-08-23 NOTE — Assessment & Plan Note (Signed)
Oxycodone   Potential benefits of a long term opioids use as well as potential risks (i.e. addiction risk, apnea etc) and complications (i.e. Somnolence, constipation and others) were explained to the patient and were aknowledged. 

## 2017-08-23 NOTE — Assessment & Plan Note (Signed)
Buspar

## 2017-08-29 DIAGNOSIS — K219 Gastro-esophageal reflux disease without esophagitis: Secondary | ICD-10-CM | POA: Diagnosis not present

## 2017-08-29 DIAGNOSIS — R0602 Shortness of breath: Secondary | ICD-10-CM | POA: Diagnosis not present

## 2017-08-29 DIAGNOSIS — Z7901 Long term (current) use of anticoagulants: Secondary | ICD-10-CM | POA: Diagnosis not present

## 2017-08-29 DIAGNOSIS — I480 Paroxysmal atrial fibrillation: Secondary | ICD-10-CM | POA: Diagnosis not present

## 2017-08-29 DIAGNOSIS — E785 Hyperlipidemia, unspecified: Secondary | ICD-10-CM | POA: Diagnosis not present

## 2017-08-29 DIAGNOSIS — Z8711 Personal history of peptic ulcer disease: Secondary | ICD-10-CM | POA: Diagnosis not present

## 2017-08-29 DIAGNOSIS — I119 Hypertensive heart disease without heart failure: Secondary | ICD-10-CM | POA: Diagnosis not present

## 2017-09-30 ENCOUNTER — Telehealth: Payer: Self-pay | Admitting: Internal Medicine

## 2017-09-30 NOTE — Telephone Encounter (Signed)
Patient requesting refill on [facility].

## 2017-09-30 NOTE — Telephone Encounter (Signed)
pls advise if refill ok...Cody Frazier

## 2017-09-30 NOTE — Telephone Encounter (Signed)
OK to fill this/these prescription(s) with additional refills x11 Needs to have an OV every 3 months Thank you!

## 2017-10-01 MED ORDER — BUSPIRONE HCL 7.5 MG PO TABS: ORAL_TABLET | ORAL | 11 refills | Status: DC

## 2017-10-01 NOTE — Telephone Encounter (Signed)
[facility].../lmb

## 2017-10-07 NOTE — Telephone Encounter (Signed)
OK Lexapro 5 mg #30 with 11 ref instead Thx

## 2017-10-07 NOTE — Telephone Encounter (Signed)
[facility] stating all strengths are on Manufacture backorder, please advise on alternative.

## 2017-10-08 ENCOUNTER — Other Ambulatory Visit: Payer: Self-pay | Admitting: Internal Medicine

## 2017-10-24 ENCOUNTER — Telehealth: Payer: Self-pay | Admitting: Internal Medicine

## 2017-10-24 NOTE — Telephone Encounter (Signed)
Pharmacy called requesting something in place of buspirone due to manufacture having on back order.

## 2017-10-25 ENCOUNTER — Telehealth: Payer: Self-pay | Admitting: Internal Medicine

## 2017-10-25 MED ORDER — ESCITALOPRAM OXALATE 5 MG PO TABS: 5.0000 mg | ORAL_TABLET | Freq: Every day | ORAL | 11 refills | Status: DC

## 2017-10-25 NOTE — Telephone Encounter (Signed)
Called pharmacy spoke w/pharmacist Thayer Headings verified which med is on back order. Per Thayer Headings the Buspar is on back order. They have the Lexapro ready for pick-up, and have noted for pt...Cody Frazier

## 2017-10-25 NOTE — Telephone Encounter (Signed)
Copied from [place]. Topic: Quick Communication - See Telephone Encounter >> Oct 25, 2017 11:07 AM Patrice Paradise wrote: Reason for CRM: [facility] called to see if Dr. Alain Marion can send over an alternative RX for escitalopram (LEXAPRO) 5 MG tablet. This med is still on back order.  CRM for notification. See Telephone encounter for: 10/25/17.

## 2017-10-25 NOTE — Telephone Encounter (Deleted)
Copied from [place]. Topic: Inquiry >> Oct 25, 2017 10:58 AM Patrice Paradise wrote: Reason for CRM: [facility] called to see if Dr. Alain Marion can send over an alternative RX for escitalopram (LEXAPRO) 5 MG tablet. This med is still on back order.

## 2017-10-25 NOTE — Telephone Encounter (Signed)
MD is out of office pls advise on msg below...Cody Frazier

## 2017-10-25 NOTE — Telephone Encounter (Signed)
See request °

## 2017-10-25 NOTE — Telephone Encounter (Signed)
Which medication is back ordered? Prior phone note indicates buspar is back ordered and lexapro is the replacement medication which was sent in today for the patient. Please clarify if patient is able to get lexapro?

## 2017-10-25 NOTE — Telephone Encounter (Signed)
Per last TE Lexapro 5mg  1 tab QD

## 2017-11-04 ENCOUNTER — Other Ambulatory Visit: Payer: Self-pay

## 2017-11-04 DIAGNOSIS — K2990 Gastroduodenitis, unspecified, without bleeding: Secondary | ICD-10-CM

## 2017-11-04 DIAGNOSIS — K2970 Gastritis, unspecified, without bleeding: Secondary | ICD-10-CM

## 2017-11-04 DIAGNOSIS — K2950 Unspecified chronic gastritis without bleeding: Secondary | ICD-10-CM

## 2017-11-04 DIAGNOSIS — D508 Other iron deficiency anemias: Secondary | ICD-10-CM

## 2017-11-04 DIAGNOSIS — K2270 Barrett's esophagus without dysplasia: Secondary | ICD-10-CM

## 2017-11-04 MED ORDER — PANTOPRAZOLE SODIUM 40 MG PO TBEC: 40.0000 mg | DELAYED_RELEASE_TABLET | Freq: Two times a day (BID) | ORAL | 0 refills | Status: DC

## 2017-11-06 ENCOUNTER — Other Ambulatory Visit: Payer: Self-pay

## 2017-11-14 DIAGNOSIS — Z8711 Personal history of peptic ulcer disease: Secondary | ICD-10-CM | POA: Diagnosis not present

## 2017-11-14 DIAGNOSIS — R0602 Shortness of breath: Secondary | ICD-10-CM | POA: Diagnosis not present

## 2017-11-14 DIAGNOSIS — E669 Obesity, unspecified: Secondary | ICD-10-CM | POA: Diagnosis not present

## 2017-11-14 DIAGNOSIS — Z7901 Long term (current) use of anticoagulants: Secondary | ICD-10-CM | POA: Diagnosis not present

## 2017-11-14 DIAGNOSIS — K219 Gastro-esophageal reflux disease without esophagitis: Secondary | ICD-10-CM | POA: Diagnosis not present

## 2017-11-14 DIAGNOSIS — I119 Hypertensive heart disease without heart failure: Secondary | ICD-10-CM | POA: Diagnosis not present

## 2017-11-14 DIAGNOSIS — I480 Paroxysmal atrial fibrillation: Secondary | ICD-10-CM | POA: Diagnosis not present

## 2017-11-14 DIAGNOSIS — E7849 Other hyperlipidemia: Secondary | ICD-10-CM | POA: Diagnosis not present

## 2017-11-18 ENCOUNTER — Encounter: Payer: Self-pay | Admitting: Internal Medicine

## 2017-11-18 ENCOUNTER — Ambulatory Visit (INDEPENDENT_AMBULATORY_CARE_PROVIDER_SITE_OTHER): Payer: Medicare Other | Admitting: Internal Medicine

## 2017-11-18 VITALS — BP 126/80 | HR 72 | Temp 98.4°F | Ht 66.0 in | Wt 209.0 lb

## 2017-11-18 DIAGNOSIS — E785 Hyperlipidemia, unspecified: Secondary | ICD-10-CM | POA: Diagnosis not present

## 2017-11-18 DIAGNOSIS — I119 Hypertensive heart disease without heart failure: Secondary | ICD-10-CM | POA: Diagnosis not present

## 2017-11-18 DIAGNOSIS — F411 Generalized anxiety disorder: Secondary | ICD-10-CM

## 2017-11-18 DIAGNOSIS — K922 Gastrointestinal hemorrhage, unspecified: Secondary | ICD-10-CM

## 2017-11-18 DIAGNOSIS — N320 Bladder-neck obstruction: Secondary | ICD-10-CM

## 2017-11-18 DIAGNOSIS — M519 Unspecified thoracic, thoracolumbar and lumbosacral intervertebral disc disorder: Secondary | ICD-10-CM | POA: Diagnosis not present

## 2017-11-18 MED ORDER — OXYCODONE HCL 15 MG PO TABS: 15.0000 mg | ORAL_TABLET | Freq: Four times a day (QID) | ORAL | 0 refills | Status: DC | PRN

## 2017-11-18 NOTE — Assessment & Plan Note (Signed)
  Oxycodone   Potential benefits of a long term opioids use as well as potential risks (i.e. addiction risk, apnea etc) and complications (i.e. Somnolence, constipation and others) were explained to the patient and were aknowledged. 

## 2017-11-18 NOTE — Assessment & Plan Note (Signed)
Cody Frazier

## 2017-11-18 NOTE — Patient Instructions (Signed)
MC well w/Cody Frazier 

## 2017-11-18 NOTE — Assessment & Plan Note (Signed)
Lipitor 

## 2017-11-18 NOTE — Assessment & Plan Note (Signed)
Labs

## 2017-11-18 NOTE — Assessment & Plan Note (Signed)
On Losartan 

## 2017-11-18 NOTE — Progress Notes (Signed)
Subjective:  Patient ID: Cody Frazier, male    DOB: 16-Mar-1946  Age: 72 y.o. MRN: [id number]  CC: No chief complaint on file.   HPI Cody Frazier presents for LBP, wt gain, HTN f/u  Outpatient Medications Prior to Visit  Medication Sig Dispense Refill  . acetaminophen (TYLENOL) 500 MG tablet Take 1,000 mg by mouth every 6 (six) hours as needed for mild pain or moderate pain.    Marland Kitchen atorvastatin (LIPITOR) 20 MG tablet TAKE 1 TABLET BY MOUTH EVERY DAY 90 tablet 3  . busPIRone (BUSPAR) 7.5 MG tablet TAKE 1 - 2 TABLETS DAILY AS DIRECTED FOR ANXIETY. 60 tablet 11  . cetirizine (ZYRTEC) 10 MG tablet Take 10 mg by mouth daily.    . clotrimazole-betamethasone (LOTRISONE) cream Apply topically 2 (two) times daily. 60 g 1  . diltiazem (CARDIZEM CD) 180 MG 24 hr capsule Take 1 capsule (180 mg total) by mouth daily. 30 capsule 0  . escitalopram (LEXAPRO) 5 MG tablet Take 1 tablet (5 mg total) by mouth daily. 30 tablet 11  . ferrous sulfate 325 (65 FE) MG tablet Take 325 mg by mouth 2 (two) times daily.      . fluticasone (FLONASE) 50 MCG/ACT nasal spray PLACE 2 SPRAYS INTO BOTH NOSTRILS DAILY. 18 g 11  . latanoprost (XALATAN) 0.005 % ophthalmic solution Place 1 drop into both eyes at bedtime.  11  . metoCLOPramide (REGLAN) 5 MG tablet TAKE 1 TABLET (5 MG TOTAL) BY MOUTH EVERY 8 (EIGHT) HOURS AS NEEDED FOR NAUSEA. 90 tablet 0  . metoprolol tartrate (LOPRESSOR) 25 MG tablet Take 1 tablet (25 mg total) by mouth every 8 (eight) hours. 90 tablet 0  . Multiple Vitamins-Minerals (CENTRUM SILVER PO) Take by mouth daily.      . ondansetron (ZOFRAN) 4 MG tablet TAKE 1 TABLET (4 MG TOTAL) BY MOUTH EVERY 8 (EIGHT) HOURS AS NEEDED FOR NAUSEA OR VOMITING. 20 tablet 0  . oxyCODONE (ROXICODONE) 15 MG immediate release tablet Take 1 tablet (15 mg total) by mouth 4 (four) times daily as needed for pain. For pain - Fill on or after 10/25/17 Please fill every 30 days 120 tablet 0  . pantoprazole (PROTONIX) 40 MG  tablet Take 1 tablet (40 mg total) by mouth 2 (two) times daily. 90 tablet 0  . polyethylene glycol (MIRALAX / GLYCOLAX) packet Take 17 g by mouth daily. 14 each 0  . telmisartan (MICARDIS) 80 MG tablet Take 1 tablet (80 mg total) by mouth daily. 90 tablet 3  . triamcinolone cream (KENALOG) 0.5 % Apply 1 application topically 3 (three) times daily. 90 g 1   Facility-Administered Medications Prior to Visit  Medication Dose Route Frequency Provider Last Rate Last Dose  . 0.9 %  sodium chloride infusion  500 mL Intravenous Continuous Ladene Artist, MD        ROS Review of Systems  Constitutional: Negative for appetite change, fatigue and unexpected weight change.  HENT: Negative for congestion, nosebleeds, sneezing, sore throat and trouble swallowing.   Eyes: Negative for itching and visual disturbance.  Respiratory: Negative for cough.   Cardiovascular: Negative for chest pain, palpitations and leg swelling.  Gastrointestinal: Negative for abdominal distention, blood in stool, diarrhea and nausea.  Genitourinary: Negative for frequency and hematuria.  Musculoskeletal: Positive for back pain and gait problem. Negative for joint swelling and neck pain.  Skin: Negative for rash.  Neurological: Negative for dizziness, tremors, speech difficulty and weakness.  Psychiatric/Behavioral: Negative for agitation,  dysphoric mood and sleep disturbance.    Objective:  BP 126/80 (BP Location: Left Arm, Patient Position: Sitting, Cuff Size: Large)   Pulse 72   Temp 98.4 F (36.9 C) (Oral)   Ht 5\' 6"  (1.676 m)   Wt 209 lb (94.8 kg)   SpO2 98%   BMI 33.73 kg/m   BP Readings from Last 3 Encounters:  11/18/17 126/80  08/23/17 128/84  05/22/17 114/68    Wt Readings from Last 3 Encounters:  11/18/17 209 lb (94.8 kg)  08/23/17 205 lb (93 kg)  05/22/17 206 lb (93.4 kg)    Physical Exam  Constitutional: He is oriented to person, place, and time. He appears well-developed. No distress.  NAD    HENT:  Mouth/Throat: Oropharynx is clear and moist.  Eyes: Conjunctivae are normal. Pupils are equal, round, and reactive to light.  Neck: Normal range of motion. No JVD present. No thyromegaly present.  Cardiovascular: Normal rate, regular rhythm, normal heart sounds and intact distal pulses. Exam reveals no gallop and no friction rub.  No murmur heard. Pulmonary/Chest: Effort normal and breath sounds normal. No respiratory distress. He has no wheezes. He has no rales. He exhibits no tenderness.  Abdominal: Soft. Bowel sounds are normal. He exhibits no distension and no mass. There is no tenderness. There is no rebound and no guarding.  Musculoskeletal: Normal range of motion. He exhibits tenderness. He exhibits no edema.  Lymphadenopathy:    He has no cervical adenopathy.  Neurological: He is alert and oriented to person, place, and time. He has normal reflexes. No cranial nerve deficit. He exhibits normal muscle tone. He displays a negative Romberg sign. Coordination and gait normal.  Skin: Skin is warm and dry. No rash noted.  Psychiatric: He has a normal mood and affect. His behavior is normal. Judgment and thought content normal.  LS is tender  Lab Results  Component Value Date   WBC 10.9 (H) 05/22/2017   HGB 12.8 (L) 05/22/2017   HCT 37.9 (L) 05/22/2017   PLT 372.0 05/22/2017   GLUCOSE 129 (H) 05/22/2017   CHOL 199 02/09/2016   TRIG 186.0 (H) 02/09/2016   HDL 50.20 02/09/2016   LDLDIRECT 171.0 05/20/2015   LDLCALC 112 (H) 02/09/2016   ALT 21 05/10/2017   AST 47 (H) 05/10/2017   NA 139 05/22/2017   K 4.2 05/22/2017   CL 105 05/22/2017   CREATININE 1.28 05/22/2017   BUN 19 05/22/2017   CO2 23 05/22/2017   TSH 1.401 05/10/2017   PSA 0.12 02/09/2016   INR 1.11 05/10/2017   HGBA1C 5.7 11/13/2016    Ct Abdomen Pelvis W Contrast  Result Date: 05/10/2017 CLINICAL DATA:  Generalized abdominal pain EXAM: CT ABDOMEN AND PELVIS WITH CONTRAST TECHNIQUE: Multidetector CT  imaging of the abdomen and pelvis was performed using the standard protocol following bolus administration of intravenous contrast. CONTRAST:  66mL ISOVUE-300 IOPAMIDOL (ISOVUE-300) INJECTION 61% COMPARISON:  07/22/2012, 06/29/2009 FINDINGS: Lower chest: Mild scarring is noted in the right lung base. A subpleural nodule is noted on the left best seen on image number 37 of series 7 stable from prior exams dating back to 2010. Hepatobiliary: The liver is within normal limits. The gallbladder is well distended but stable. No cholelithiasis is seen. Pancreas: Unremarkable. No pancreatic ductal dilatation or surrounding inflammatory changes. Spleen: Normal in size without focal abnormality. Adrenals/Urinary Tract: Adrenal glands are within normal limits. Scattered bilateral renal calculi are again seen as are multiple renal cysts. Some slight increase in  the bulk of the renal stones is noted. The collecting systems and ureters are within normal limits. The bladder is decompressed. Stomach/Bowel: Scattered diverticular change of the colon is noted. No evidence of diverticulitis is seen. Mild inflammatory changes are noted within the small bowel in the right mid abdomen. Some associated ascites is noted. Venous engorgement is seen. Best visualized on the coronal imaging there are changes consistent with an internal hernia with the be inflamed loops of small bowel within the hernia. No definitive incarceration is noted although there is some mild dilatation of the more proximal small bowel with a transition zone at the entrance to the hernia. Prior postsurgical changes of the stomach are noted. Vascular/Lymphatic: Aortic atherosclerosis. No enlarged abdominal or pelvic lymph nodes. Reproductive: Prostate is unremarkable. Musculoskeletal: Postsurgical changes are noted in the lower lumbar spine. No acute bony abnormality is noted. IMPRESSION: 1. Changes of internal hernia within the right mid abdomen with multiple loops of  small bowel within. Some venous engorgement is identified although no obstruction of the herniated loops of bowel is noted. There is some more proximal dilatation of the small bowel secondary to the transition zone from the hernia. 2. Mild ascites.  This is likely reactive in nature 3. Chronic changes as described above. Electronically Signed   By: Inez Catalina M.D.   On: 05/10/2017 11:30   Dg Chest Port 1 View  Result Date: 05/10/2017 CLINICAL DATA:  Rule out pneumonia.  Tachycardia, short of breath EXAM: PORTABLE CHEST 1 VIEW COMPARISON:  08/26/2015 FINDINGS: Elevated right hemidiaphragm with right lower lobe atelectasis. Left lung clear. Negative for heart failure IMPRESSION: Right lower lobe atelectasis. Electronically Signed   By: Franchot Gallo M.D.   On: 05/10/2017 11:40    Assessment & Plan:   There are no diagnoses linked to this encounter. I am having [name] maintain his Multiple Vitamins-Minerals (CENTRUM SILVER PO), ferrous sulfate, ondansetron, clotrimazole-betamethasone, triamcinolone cream, acetaminophen, cetirizine, latanoprost, metoCLOPramide, diltiazem, metoprolol tartrate, polyethylene glycol, telmisartan, atorvastatin, oxyCODONE, busPIRone, fluticasone, escitalopram, and pantoprazole. We will continue to administer sodium chloride.  No orders of the defined types were placed in this encounter.    Follow-up: No Follow-up on file.  Walker Kehr, MD

## 2017-11-25 ENCOUNTER — Other Ambulatory Visit (INDEPENDENT_AMBULATORY_CARE_PROVIDER_SITE_OTHER): Payer: Medicare Other

## 2017-11-25 DIAGNOSIS — E785 Hyperlipidemia, unspecified: Secondary | ICD-10-CM | POA: Diagnosis not present

## 2017-11-25 DIAGNOSIS — I119 Hypertensive heart disease without heart failure: Secondary | ICD-10-CM

## 2017-11-25 DIAGNOSIS — D500 Iron deficiency anemia secondary to blood loss (chronic): Secondary | ICD-10-CM | POA: Diagnosis not present

## 2017-11-25 DIAGNOSIS — K922 Gastrointestinal hemorrhage, unspecified: Secondary | ICD-10-CM | POA: Diagnosis not present

## 2017-11-25 LAB — LIPID PANEL
Cholesterol: 165 mg/dL (ref 0–200)
HDL: 43.3 mg/dL (ref >39.00)
LDL Cholesterol: 86 mg/dL (ref 0–99)
NonHDL: 121.2
Total CHOL/HDL Ratio: 4
Triglycerides: 176 mg/dL — ABNORMAL HIGH (ref 0.0–149.0)
VLDL: 35.2 mg/dL (ref 0.0–40.0)

## 2017-11-25 LAB — CBC WITH DIFFERENTIAL/PLATELET
Basophils Absolute: 0 10*3/uL (ref 0.0–0.1)
Basophils Relative: 0.6 % (ref 0.0–3.0)
Eosinophils Absolute: 0.3 10*3/uL (ref 0.0–0.7)
Eosinophils Relative: 4.5 % (ref 0.0–5.0)
HCT: 41.3 % (ref 39.0–52.0)
Hemoglobin: 13.8 g/dL (ref 13.0–17.0)
Lymphocytes Relative: 41.9 % (ref 12.0–46.0)
Lymphs Abs: 2.7 10*3/uL (ref 0.7–4.0)
MCHC: 33.4 g/dL (ref 30.0–36.0)
MCV: 94.4 fl (ref 78.0–100.0)
Monocytes Absolute: 0.6 10*3/uL (ref 0.1–1.0)
Monocytes Relative: 8.5 % (ref 3.0–12.0)
Neutro Abs: 2.9 10*3/uL (ref 1.4–7.7)
Neutrophils Relative %: 44.5 % (ref 43.0–77.0)
Platelets: 296 10*3/uL (ref 150.0–400.0)
RBC: 4.37 Mil/uL (ref 4.22–5.81)
RDW: 12.8 % (ref 11.5–15.5)
WBC: 6.5 10*3/uL (ref 4.0–10.5)

## 2017-11-25 LAB — IBC PANEL
Iron: 97 ug/dL (ref 42–165)
Saturation Ratios: 26.9 % (ref 20.0–50.0)
Transferrin: 258 mg/dL (ref 212.0–360.0)

## 2017-11-25 LAB — HEPATIC FUNCTION PANEL
ALT: 13 U/L (ref 0–53)
AST: 12 U/L (ref 0–37)
Albumin: 4 g/dL (ref 3.5–5.2)
Alkaline Phosphatase: 136 U/L — ABNORMAL HIGH (ref 39–117)
Bilirubin, Direct: 0.1 mg/dL (ref 0.0–0.3)
Total Bilirubin: 0.6 mg/dL (ref 0.2–1.2)
Total Protein: 6.8 g/dL (ref 6.0–8.3)

## 2017-11-25 LAB — TSH: TSH: 0.9 u[IU]/mL (ref 0.35–4.50)

## 2017-12-24 ENCOUNTER — Other Ambulatory Visit: Payer: Self-pay | Admitting: Gastroenterology

## 2017-12-24 DIAGNOSIS — K2990 Gastroduodenitis, unspecified, without bleeding: Secondary | ICD-10-CM

## 2017-12-24 DIAGNOSIS — K2270 Barrett's esophagus without dysplasia: Secondary | ICD-10-CM

## 2017-12-24 DIAGNOSIS — D508 Other iron deficiency anemias: Secondary | ICD-10-CM

## 2017-12-24 DIAGNOSIS — K2950 Unspecified chronic gastritis without bleeding: Secondary | ICD-10-CM

## 2017-12-24 DIAGNOSIS — K2970 Gastritis, unspecified, without bleeding: Secondary | ICD-10-CM

## 2017-12-31 ENCOUNTER — Telehealth: Payer: Self-pay | Admitting: Gastroenterology

## 2017-12-31 NOTE — Telephone Encounter (Signed)
Informed patient that I can try to do another PA with his insurance company for twice a day therapy because last year it was approved until 10/2017. Informed patient that I will contact his insurance company and call him back once I receive a response. Patient verbalized understanding.

## 2018-01-01 NOTE — Telephone Encounter (Signed)
Initiated PA through cover my meds

## 2018-01-01 NOTE — Telephone Encounter (Signed)
Informed patient was got the pantoprazole approved with [employer] [facility] until 10/2018. Patient verbalized understanding.

## 2018-01-16 DIAGNOSIS — H2513 Age-related nuclear cataract, bilateral: Secondary | ICD-10-CM | POA: Diagnosis not present

## 2018-01-16 DIAGNOSIS — H401131 Primary open-angle glaucoma, bilateral, mild stage: Secondary | ICD-10-CM | POA: Diagnosis not present

## 2018-01-23 ENCOUNTER — Other Ambulatory Visit: Payer: Self-pay

## 2018-01-23 DIAGNOSIS — K2970 Gastritis, unspecified, without bleeding: Secondary | ICD-10-CM

## 2018-01-23 DIAGNOSIS — K2990 Gastroduodenitis, unspecified, without bleeding: Secondary | ICD-10-CM

## 2018-01-23 DIAGNOSIS — K2950 Unspecified chronic gastritis without bleeding: Secondary | ICD-10-CM

## 2018-01-23 DIAGNOSIS — K2270 Barrett's esophagus without dysplasia: Secondary | ICD-10-CM

## 2018-01-23 DIAGNOSIS — D508 Other iron deficiency anemias: Secondary | ICD-10-CM

## 2018-01-23 MED ORDER — PANTOPRAZOLE SODIUM 40 MG PO TBEC: 40.0000 mg | DELAYED_RELEASE_TABLET | Freq: Two times a day (BID) | ORAL | 0 refills | Status: DC

## 2018-02-17 ENCOUNTER — Encounter: Payer: Self-pay | Admitting: Internal Medicine

## 2018-02-17 ENCOUNTER — Ambulatory Visit (INDEPENDENT_AMBULATORY_CARE_PROVIDER_SITE_OTHER): Payer: Medicare Other | Admitting: Internal Medicine

## 2018-02-17 DIAGNOSIS — F411 Generalized anxiety disorder: Secondary | ICD-10-CM | POA: Diagnosis not present

## 2018-02-17 DIAGNOSIS — I480 Paroxysmal atrial fibrillation: Secondary | ICD-10-CM

## 2018-02-17 DIAGNOSIS — M519 Unspecified thoracic, thoracolumbar and lumbosacral intervertebral disc disorder: Secondary | ICD-10-CM

## 2018-02-17 MED ORDER — APIXABAN 2.5 MG PO TABS: 2.5000 mg | ORAL_TABLET | Freq: Two times a day (BID) | ORAL | 11 refills | Status: DC

## 2018-02-17 MED ORDER — OXYCODONE HCL 15 MG PO TABS: 15.0000 mg | ORAL_TABLET | Freq: Four times a day (QID) | ORAL | 0 refills | Status: DC | PRN

## 2018-02-17 NOTE — Assessment & Plan Note (Addendum)
on anticoagulation - Eliquis

## 2018-02-17 NOTE — Assessment & Plan Note (Signed)
Oxycodone   Potential benefits of a long term opioids use as well as potential risks (i.e. addiction risk, apnea etc) and complications (i.e. Somnolence, constipation and others) were explained to the patient and were aknowledged. 

## 2018-02-17 NOTE — Progress Notes (Signed)
Subjective:  Patient ID: Cody Frazier, male    DOB: 12-12-1945  Age: 72 y.o. MRN: [id number]  CC: No chief complaint on file.   HPI Cody Frazier presents for LBP, CAD, PUD f/u  Outpatient Medications Prior to Visit  Medication Sig Dispense Refill  . acetaminophen (TYLENOL) 500 MG tablet Take 1,000 mg by mouth every 6 (six) hours as needed for mild pain or moderate pain.    Marland Kitchen atorvastatin (LIPITOR) 20 MG tablet TAKE 1 TABLET BY MOUTH EVERY DAY 90 tablet 3  . busPIRone (BUSPAR) 7.5 MG tablet TAKE 1 - 2 TABLETS DAILY AS DIRECTED FOR ANXIETY. 60 tablet 11  . cetirizine (ZYRTEC) 10 MG tablet Take 10 mg by mouth daily.    . clotrimazole-betamethasone (LOTRISONE) cream Apply topically 2 (two) times daily. 60 g 1  . diltiazem (CARDIZEM CD) 180 MG 24 hr capsule Take 1 capsule (180 mg total) by mouth daily. 30 capsule 0  . escitalopram (LEXAPRO) 5 MG tablet Take 1 tablet (5 mg total) by mouth daily. 30 tablet 11  . ferrous sulfate 325 (65 FE) MG tablet Take 325 mg by mouth 2 (two) times daily.      . fluticasone (FLONASE) 50 MCG/ACT nasal spray PLACE 2 SPRAYS INTO BOTH NOSTRILS DAILY. 18 g 11  . latanoprost (XALATAN) 0.005 % ophthalmic solution Place 1 drop into both eyes at bedtime.  11  . metoCLOPramide (REGLAN) 5 MG tablet TAKE 1 TABLET (5 MG TOTAL) BY MOUTH EVERY 8 (EIGHT) HOURS AS NEEDED FOR NAUSEA. 90 tablet 0  . metoprolol tartrate (LOPRESSOR) 25 MG tablet Take 1 tablet (25 mg total) by mouth every 8 (eight) hours. 90 tablet 0  . Multiple Vitamins-Minerals (CENTRUM SILVER PO) Take by mouth daily.      . ondansetron (ZOFRAN) 4 MG tablet TAKE 1 TABLET (4 MG TOTAL) BY MOUTH EVERY 8 (EIGHT) HOURS AS NEEDED FOR NAUSEA OR VOMITING. 20 tablet 0  . oxyCODONE (ROXICODONE) 15 MG immediate release tablet Take 1 tablet (15 mg total) by mouth 4 (four) times daily as needed for pain. For pain - Fill on or after 01/23/18 Please fill every 30 days 120 tablet 0  . pantoprazole (PROTONIX) 40 MG tablet  Take 1 tablet (40 mg total) by mouth 2 (two) times daily. 90 tablet 0  . polyethylene glycol (MIRALAX / GLYCOLAX) packet Take 17 g by mouth daily. 14 each 0  . telmisartan (MICARDIS) 80 MG tablet Take 1 tablet (80 mg total) by mouth daily. 90 tablet 3  . triamcinolone cream (KENALOG) 0.5 % Apply 1 application topically 3 (three) times daily. 90 g 1   Facility-Administered Medications Prior to Visit  Medication Dose Route Frequency Provider Last Rate Last Dose  . 0.9 %  sodium chloride infusion  500 mL Intravenous Continuous Ladene Artist, MD        ROS Review of Systems  Constitutional: Negative for appetite change, fatigue and unexpected weight change.  HENT: Negative for congestion, nosebleeds, sneezing, sore throat and trouble swallowing.   Eyes: Negative for itching and visual disturbance.  Respiratory: Negative for cough.   Cardiovascular: Negative for chest pain, palpitations and leg swelling.  Gastrointestinal: Negative for abdominal distention, blood in stool, diarrhea and nausea.  Genitourinary: Negative for frequency and hematuria.  Musculoskeletal: Positive for back pain. Negative for gait problem, joint swelling and neck pain.  Skin: Negative for rash.  Neurological: Negative for dizziness, tremors, speech difficulty and weakness.  Psychiatric/Behavioral: Negative for agitation, dysphoric mood,  sleep disturbance and suicidal ideas. The patient is not nervous/anxious.   obese LS tender  Objective:  BP 128/82 (BP Location: Left Arm, Patient Position: Sitting, Cuff Size: Large)   Pulse 68   Temp 99.1 F (37.3 C) (Oral)   Ht 5\' 6"  (1.676 m)   Wt 213 lb (96.6 kg)   SpO2 97%   BMI 34.38 kg/m   BP Readings from Last 3 Encounters:  02/17/18 128/82  11/18/17 126/80  08/23/17 128/84    Wt Readings from Last 3 Encounters:  02/17/18 213 lb (96.6 kg)  11/18/17 209 lb (94.8 kg)  08/23/17 205 lb (93 kg)    Physical Exam  Constitutional: He is oriented to person,  place, and time. He appears well-developed. No distress.  NAD  HENT:  Mouth/Throat: Oropharynx is clear and moist.  Eyes: Pupils are equal, round, and reactive to light. Conjunctivae are normal.  Neck: Normal range of motion. No JVD present. No thyromegaly present.  Cardiovascular: Normal rate, regular rhythm, normal heart sounds and intact distal pulses. Exam reveals no gallop and no friction rub.  No murmur heard. Pulmonary/Chest: Effort normal and breath sounds normal. No respiratory distress. He has no wheezes. He has no rales. He exhibits no tenderness.  Abdominal: Soft. Bowel sounds are normal. He exhibits no distension and no mass. There is no tenderness. There is no rebound and no guarding.  Musculoskeletal: Normal range of motion. He exhibits tenderness. He exhibits no edema.  Lymphadenopathy:    He has no cervical adenopathy.  Neurological: He is alert and oriented to person, place, and time. He has normal reflexes. No cranial nerve deficit. He exhibits normal muscle tone. He displays a negative Romberg sign. Coordination and gait normal.  Skin: Skin is warm and dry. No rash noted.  Psychiatric: He has a normal mood and affect. His behavior is normal. Judgment and thought content normal.   obese  Lab Results  Component Value Date   WBC 6.5 11/25/2017   HGB 13.8 11/25/2017   HCT 41.3 11/25/2017   PLT 296.0 11/25/2017   GLUCOSE 129 (H) 05/22/2017   CHOL 165 11/25/2017   TRIG 176.0 (H) 11/25/2017   HDL 43.30 11/25/2017   LDLDIRECT 171.0 05/20/2015   LDLCALC 86 11/25/2017   ALT 13 11/25/2017   AST 12 11/25/2017   NA 139 05/22/2017   K 4.2 05/22/2017   CL 105 05/22/2017   CREATININE 1.28 05/22/2017   BUN 19 05/22/2017   CO2 23 05/22/2017   TSH 0.90 11/25/2017   PSA 0.12 02/09/2016   INR 1.11 05/10/2017   HGBA1C 5.7 11/13/2016    Ct Abdomen Pelvis W Contrast  Result Date: 05/10/2017 CLINICAL DATA:  Generalized abdominal pain EXAM: CT ABDOMEN AND PELVIS WITH CONTRAST  TECHNIQUE: Multidetector CT imaging of the abdomen and pelvis was performed using the standard protocol following bolus administration of intravenous contrast. CONTRAST:  72mL ISOVUE-300 IOPAMIDOL (ISOVUE-300) INJECTION 61% COMPARISON:  07/22/2012, 06/29/2009 FINDINGS: Lower chest: Mild scarring is noted in the right lung base. A subpleural nodule is noted on the left best seen on image number 37 of series 7 stable from prior exams dating back to 2010. Hepatobiliary: The liver is within normal limits. The gallbladder is well distended but stable. No cholelithiasis is seen. Pancreas: Unremarkable. No pancreatic ductal dilatation or surrounding inflammatory changes. Spleen: Normal in size without focal abnormality. Adrenals/Urinary Tract: Adrenal glands are within normal limits. Scattered bilateral renal calculi are again seen as are multiple renal cysts. Some slight increase  in the bulk of the renal stones is noted. The collecting systems and ureters are within normal limits. The bladder is decompressed. Stomach/Bowel: Scattered diverticular change of the colon is noted. No evidence of diverticulitis is seen. Mild inflammatory changes are noted within the small bowel in the right mid abdomen. Some associated ascites is noted. Venous engorgement is seen. Best visualized on the coronal imaging there are changes consistent with an internal hernia with the be inflamed loops of small bowel within the hernia. No definitive incarceration is noted although there is some mild dilatation of the more proximal small bowel with a transition zone at the entrance to the hernia. Prior postsurgical changes of the stomach are noted. Vascular/Lymphatic: Aortic atherosclerosis. No enlarged abdominal or pelvic lymph nodes. Reproductive: Prostate is unremarkable. Musculoskeletal: Postsurgical changes are noted in the lower lumbar spine. No acute bony abnormality is noted. IMPRESSION: 1. Changes of internal hernia within the right mid  abdomen with multiple loops of small bowel within. Some venous engorgement is identified although no obstruction of the herniated loops of bowel is noted. There is some more proximal dilatation of the small bowel secondary to the transition zone from the hernia. 2. Mild ascites.  This is likely reactive in nature 3. Chronic changes as described above. Electronically Signed   By: Inez Catalina M.D.   On: 05/10/2017 11:30   Dg Chest Port 1 View  Result Date: 05/10/2017 CLINICAL DATA:  Rule out pneumonia.  Tachycardia, short of breath EXAM: PORTABLE CHEST 1 VIEW COMPARISON:  08/26/2015 FINDINGS: Elevated right hemidiaphragm with right lower lobe atelectasis. Left lung clear. Negative for heart failure IMPRESSION: Right lower lobe atelectasis. Electronically Signed   By: Franchot Gallo M.D.   On: 05/10/2017 11:40    Assessment & Plan:   There are no diagnoses linked to this encounter. I am having [name] maintain his Multiple Vitamins-Minerals (CENTRUM SILVER PO), ferrous sulfate, ondansetron, clotrimazole-betamethasone, triamcinolone cream, acetaminophen, cetirizine, latanoprost, metoCLOPramide, diltiazem, metoprolol tartrate, polyethylene glycol, telmisartan, atorvastatin, busPIRone, fluticasone, escitalopram, oxyCODONE, and pantoprazole. We will continue to administer sodium chloride.  No orders of the defined types were placed in this encounter.    Follow-up: No follow-ups on file.  Walker Kehr, MD

## 2018-02-17 NOTE — Assessment & Plan Note (Signed)
Buspar

## 2018-02-27 DIAGNOSIS — H401131 Primary open-angle glaucoma, bilateral, mild stage: Secondary | ICD-10-CM | POA: Diagnosis not present

## 2018-03-26 ENCOUNTER — Other Ambulatory Visit: Payer: Self-pay | Admitting: Gastroenterology

## 2018-03-26 DIAGNOSIS — K2950 Unspecified chronic gastritis without bleeding: Secondary | ICD-10-CM

## 2018-03-26 DIAGNOSIS — K2270 Barrett's esophagus without dysplasia: Secondary | ICD-10-CM

## 2018-03-26 DIAGNOSIS — K2970 Gastritis, unspecified, without bleeding: Secondary | ICD-10-CM

## 2018-03-26 DIAGNOSIS — D508 Other iron deficiency anemias: Secondary | ICD-10-CM

## 2018-03-26 DIAGNOSIS — K2990 Gastroduodenitis, unspecified, without bleeding: Secondary | ICD-10-CM

## 2018-04-01 DIAGNOSIS — H01012 Ulcerative blepharitis right lower eyelid: Secondary | ICD-10-CM | POA: Diagnosis not present

## 2018-04-01 DIAGNOSIS — H01015 Ulcerative blepharitis left lower eyelid: Secondary | ICD-10-CM | POA: Diagnosis not present

## 2018-05-10 ENCOUNTER — Other Ambulatory Visit: Payer: Self-pay | Admitting: Gastroenterology

## 2018-05-10 DIAGNOSIS — K2970 Gastritis, unspecified, without bleeding: Secondary | ICD-10-CM

## 2018-05-10 DIAGNOSIS — D508 Other iron deficiency anemias: Secondary | ICD-10-CM

## 2018-05-10 DIAGNOSIS — K2950 Unspecified chronic gastritis without bleeding: Secondary | ICD-10-CM

## 2018-05-10 DIAGNOSIS — K2990 Gastroduodenitis, unspecified, without bleeding: Secondary | ICD-10-CM

## 2018-05-10 DIAGNOSIS — K2270 Barrett's esophagus without dysplasia: Secondary | ICD-10-CM

## 2018-05-13 ENCOUNTER — Telehealth: Payer: Self-pay | Admitting: Gastroenterology

## 2018-05-13 DIAGNOSIS — K2990 Gastroduodenitis, unspecified, without bleeding: Secondary | ICD-10-CM

## 2018-05-13 DIAGNOSIS — Z7901 Long term (current) use of anticoagulants: Secondary | ICD-10-CM | POA: Diagnosis not present

## 2018-05-13 DIAGNOSIS — I119 Hypertensive heart disease without heart failure: Secondary | ICD-10-CM | POA: Diagnosis not present

## 2018-05-13 DIAGNOSIS — D508 Other iron deficiency anemias: Secondary | ICD-10-CM

## 2018-05-13 DIAGNOSIS — E669 Obesity, unspecified: Secondary | ICD-10-CM | POA: Diagnosis not present

## 2018-05-13 DIAGNOSIS — I480 Paroxysmal atrial fibrillation: Secondary | ICD-10-CM | POA: Diagnosis not present

## 2018-05-13 DIAGNOSIS — K2270 Barrett's esophagus without dysplasia: Secondary | ICD-10-CM

## 2018-05-13 DIAGNOSIS — K219 Gastro-esophageal reflux disease without esophagitis: Secondary | ICD-10-CM | POA: Diagnosis not present

## 2018-05-13 DIAGNOSIS — K2950 Unspecified chronic gastritis without bleeding: Secondary | ICD-10-CM

## 2018-05-13 DIAGNOSIS — K2970 Gastritis, unspecified, without bleeding: Secondary | ICD-10-CM

## 2018-05-13 DIAGNOSIS — Z8711 Personal history of peptic ulcer disease: Secondary | ICD-10-CM | POA: Diagnosis not present

## 2018-05-13 DIAGNOSIS — E785 Hyperlipidemia, unspecified: Secondary | ICD-10-CM | POA: Diagnosis not present

## 2018-05-13 DIAGNOSIS — E7849 Other hyperlipidemia: Secondary | ICD-10-CM | POA: Diagnosis not present

## 2018-05-13 MED ORDER — PANTOPRAZOLE SODIUM 40 MG PO TBEC: 40.0000 mg | DELAYED_RELEASE_TABLET | Freq: Two times a day (BID) | ORAL | 1 refills | Status: DC

## 2018-05-13 NOTE — Telephone Encounter (Signed)
Prescription sent to patient's pharmacy and patient notified to keep appt for any further refills. 

## 2018-05-19 ENCOUNTER — Ambulatory Visit (INDEPENDENT_AMBULATORY_CARE_PROVIDER_SITE_OTHER): Payer: Medicare Other | Admitting: Internal Medicine

## 2018-05-19 ENCOUNTER — Ambulatory Visit (INDEPENDENT_AMBULATORY_CARE_PROVIDER_SITE_OTHER)
Admission: RE | Admit: 2018-05-19 | Discharge: 2018-05-19 | Disposition: A | Discharge status: Home / Self Care | Payer: Medicare Other | Source: Ambulatory Visit | Attending: Internal Medicine | Admitting: Internal Medicine

## 2018-05-19 ENCOUNTER — Encounter: Payer: Self-pay | Admitting: Internal Medicine

## 2018-05-19 VITALS — BP 128/78 | HR 63 | Temp 98.6°F | Ht 66.0 in | Wt 220.0 lb

## 2018-05-19 DIAGNOSIS — E785 Hyperlipidemia, unspecified: Secondary | ICD-10-CM | POA: Diagnosis not present

## 2018-05-19 DIAGNOSIS — R0609 Other forms of dyspnea: Secondary | ICD-10-CM | POA: Diagnosis not present

## 2018-05-19 DIAGNOSIS — E669 Obesity, unspecified: Secondary | ICD-10-CM | POA: Insufficient documentation

## 2018-05-19 DIAGNOSIS — R0600 Dyspnea, unspecified: Secondary | ICD-10-CM

## 2018-05-19 DIAGNOSIS — R05 Cough: Secondary | ICD-10-CM

## 2018-05-19 DIAGNOSIS — I480 Paroxysmal atrial fibrillation: Secondary | ICD-10-CM

## 2018-05-19 DIAGNOSIS — R059 Cough, unspecified: Secondary | ICD-10-CM

## 2018-05-19 DIAGNOSIS — K56609 Unspecified intestinal obstruction, unspecified as to partial versus complete obstruction: Secondary | ICD-10-CM | POA: Diagnosis not present

## 2018-05-19 DIAGNOSIS — F411 Generalized anxiety disorder: Secondary | ICD-10-CM | POA: Diagnosis not present

## 2018-05-19 DIAGNOSIS — M519 Unspecified thoracic, thoracolumbar and lumbosacral intervertebral disc disorder: Secondary | ICD-10-CM | POA: Diagnosis not present

## 2018-05-19 DIAGNOSIS — Z6835 Body mass index (BMI) 35.0-35.9, adult: Secondary | ICD-10-CM

## 2018-05-19 MED ORDER — OXYCODONE HCL 15 MG PO TABS: 15.0000 mg | ORAL_TABLET | Freq: Four times a day (QID) | ORAL | 0 refills | Status: DC | PRN

## 2018-05-19 NOTE — Assessment & Plan Note (Signed)
Eliquis 

## 2018-05-19 NOTE — Assessment & Plan Note (Signed)
Lipitor 

## 2018-05-19 NOTE — Assessment & Plan Note (Signed)
No new sx's 

## 2018-05-19 NOTE — Assessment & Plan Note (Signed)
Wt wat[facility] - info

## 2018-05-19 NOTE — Assessment & Plan Note (Signed)
Oxycodone   Potential benefits of a long term opioids use as well as potential risks (i.e. addiction risk, apnea etc) and complications (i.e. Somnolence, constipation and others) were explained to the patient and were aknowledged. 

## 2018-05-19 NOTE — Progress Notes (Signed)
Subjective:  Patient ID: Cody Frazier, male    DOB: April 17, 1946  Age: 72 y.o. MRN: [id number]  CC: No chief complaint on file.   HPI Cody Frazier presents for LBP, GERD, A fib, dyslipidemia  C/o wt gain  Outpatient Medications Prior to Visit  Medication Sig Dispense Refill  . acetaminophen (TYLENOL) 500 MG tablet Take 1,000 mg by mouth every 6 (six) hours as needed for mild pain or moderate pain.    Marland Kitchen apixaban (ELIQUIS) 2.5 MG TABS tablet Take 1 tablet (2.5 mg total) by mouth 2 (two) times daily. 60 tablet 11  . atorvastatin (LIPITOR) 20 MG tablet TAKE 1 TABLET BY MOUTH EVERY DAY 90 tablet 3  . busPIRone (BUSPAR) 7.5 MG tablet TAKE 1 - 2 TABLETS DAILY AS DIRECTED FOR ANXIETY. 60 tablet 11  . cetirizine (ZYRTEC) 10 MG tablet Take 10 mg by mouth daily.    . clotrimazole-betamethasone (LOTRISONE) cream Apply topically 2 (two) times daily. 60 g 1  . diltiazem (CARDIZEM CD) 180 MG 24 hr capsule Take 1 capsule (180 mg total) by mouth daily. 30 capsule 0  . escitalopram (LEXAPRO) 5 MG tablet Take 1 tablet (5 mg total) by mouth daily. 30 tablet 11  . ferrous sulfate 325 (65 FE) MG tablet Take 325 mg by mouth 2 (two) times daily.      . fluticasone (FLONASE) 50 MCG/ACT nasal spray PLACE 2 SPRAYS INTO BOTH NOSTRILS DAILY. 18 g 11  . latanoprost (XALATAN) 0.005 % ophthalmic solution Place 1 drop into both eyes at bedtime.  11  . metoCLOPramide (REGLAN) 5 MG tablet TAKE 1 TABLET (5 MG TOTAL) BY MOUTH EVERY 8 (EIGHT) HOURS AS NEEDED FOR NAUSEA. 90 tablet 0  . metoprolol tartrate (LOPRESSOR) 25 MG tablet Take 1 tablet (25 mg total) by mouth every 8 (eight) hours. 90 tablet 0  . Multiple Vitamins-Minerals (CENTRUM SILVER PO) Take by mouth daily.      . ondansetron (ZOFRAN) 4 MG tablet TAKE 1 TABLET (4 MG TOTAL) BY MOUTH EVERY 8 (EIGHT) HOURS AS NEEDED FOR NAUSEA OR VOMITING. 20 tablet 0  . oxyCODONE (ROXICODONE) 15 MG immediate release tablet Take 1 tablet (15 mg total) by mouth 4 (four) times  daily as needed for pain. For pain - Fill on or after 03/25/18 Please fill every 30 days 120 tablet 0  . pantoprazole (PROTONIX) 40 MG tablet Take 1 tablet (40 mg total) by mouth 2 (two) times daily. 60 tablet 1  . polyethylene glycol (MIRALAX / GLYCOLAX) packet Take 17 g by mouth daily. 14 each 0  . telmisartan (MICARDIS) 80 MG tablet Take 1 tablet (80 mg total) by mouth daily. 90 tablet 3  . triamcinolone cream (KENALOG) 0.5 % Apply 1 application topically 3 (three) times daily. 90 g 1   Facility-Administered Medications Prior to Visit  Medication Dose Route Frequency Provider Last Rate Last Dose  . 0.9 %  sodium chloride infusion  500 mL Intravenous Continuous Ladene Artist, MD        ROS: Review of Systems  Constitutional: Positive for unexpected weight change. Negative for appetite change and fatigue.  HENT: Negative for congestion, nosebleeds, sneezing, sore throat and trouble swallowing.   Eyes: Negative for itching and visual disturbance.  Respiratory: Negative for cough.   Cardiovascular: Negative for chest pain, palpitations and leg swelling.  Gastrointestinal: Negative for abdominal distention, blood in stool, diarrhea and nausea.  Genitourinary: Negative for frequency and hematuria.  Musculoskeletal: Positive for back pain and  gait problem. Negative for joint swelling and neck pain.  Skin: Negative for rash.  Neurological: Negative for dizziness, tremors, speech difficulty and weakness.  Psychiatric/Behavioral: Negative for agitation, dysphoric mood, sleep disturbance and suicidal ideas. The patient is not nervous/anxious.     Objective:  BP 128/78 (BP Location: Left Arm, Patient Position: Sitting, Cuff Size: Large)   Pulse 63   Temp 98.6 F (37 C) (Oral)   Ht 5\' 6"  (1.676 m)   Wt 220 lb (99.8 kg)   SpO2 99%   BMI 35.51 kg/m   BP Readings from Last 3 Encounters:  05/19/18 128/78  02/17/18 128/82  11/18/17 126/80    Wt Readings from Last 3 Encounters:    05/19/18 220 lb (99.8 kg)  02/17/18 213 lb (96.6 kg)  11/18/17 209 lb (94.8 kg)    Physical Exam  Constitutional: He is oriented to person, place, and time. He appears well-developed. No distress.  NAD  HENT:  Mouth/Throat: Oropharynx is clear and moist.  Eyes: Pupils are equal, round, and reactive to light. Conjunctivae are normal.  Neck: Normal range of motion. No JVD present. No thyromegaly present.  Cardiovascular: Normal rate, regular rhythm, normal heart sounds and intact distal pulses. Exam reveals no gallop and no friction rub.  No murmur heard. Pulmonary/Chest: Effort normal and breath sounds normal. No respiratory distress. He has no wheezes. He has no rales. He exhibits no tenderness.  Abdominal: Soft. Bowel sounds are normal. He exhibits no distension and no mass. There is no tenderness. There is no rebound and no guarding.  Musculoskeletal: Normal range of motion. He exhibits tenderness. He exhibits no edema.  Lymphadenopathy:    He has no cervical adenopathy.  Neurological: He is alert and oriented to person, place, and time. He has normal reflexes. No cranial nerve deficit. He exhibits normal muscle tone. He displays a negative Romberg sign. Coordination and gait normal.  Skin: Skin is warm and dry. No rash noted.  Psychiatric: He has a normal mood and affect. His behavior is normal. Judgment and thought content normal.  obese LS tender  Lab Results  Component Value Date   WBC 6.5 11/25/2017   HGB 13.8 11/25/2017   HCT 41.3 11/25/2017   PLT 296.0 11/25/2017   GLUCOSE 129 (H) 05/22/2017   CHOL 165 11/25/2017   TRIG 176.0 (H) 11/25/2017   HDL 43.30 11/25/2017   LDLDIRECT 171.0 05/20/2015   LDLCALC 86 11/25/2017   ALT 13 11/25/2017   AST 12 11/25/2017   NA 139 05/22/2017   K 4.2 05/22/2017   CL 105 05/22/2017   CREATININE 1.28 05/22/2017   BUN 19 05/22/2017   CO2 23 05/22/2017   TSH 0.90 11/25/2017   PSA 0.12 02/09/2016   INR 1.11 05/10/2017   HGBA1C 5.7  11/13/2016    Ct Abdomen Pelvis W Contrast  Result Date: 05/10/2017 CLINICAL DATA:  Generalized abdominal pain EXAM: CT ABDOMEN AND PELVIS WITH CONTRAST TECHNIQUE: Multidetector CT imaging of the abdomen and pelvis was performed using the standard protocol following bolus administration of intravenous contrast. CONTRAST:  53mL ISOVUE-300 IOPAMIDOL (ISOVUE-300) INJECTION 61% COMPARISON:  07/22/2012, 06/29/2009 FINDINGS: Lower chest: Mild scarring is noted in the right lung base. A subpleural nodule is noted on the left best seen on image number 37 of series 7 stable from prior exams dating back to 2010. Hepatobiliary: The liver is within normal limits. The gallbladder is well distended but stable. No cholelithiasis is seen. Pancreas: Unremarkable. No pancreatic ductal dilatation or surrounding inflammatory  changes. Spleen: Normal in size without focal abnormality. Adrenals/Urinary Tract: Adrenal glands are within normal limits. Scattered bilateral renal calculi are again seen as are multiple renal cysts. Some slight increase in the bulk of the renal stones is noted. The collecting systems and ureters are within normal limits. The bladder is decompressed. Stomach/Bowel: Scattered diverticular change of the colon is noted. No evidence of diverticulitis is seen. Mild inflammatory changes are noted within the small bowel in the right mid abdomen. Some associated ascites is noted. Venous engorgement is seen. Best visualized on the coronal imaging there are changes consistent with an internal hernia with the be inflamed loops of small bowel within the hernia. No definitive incarceration is noted although there is some mild dilatation of the more proximal small bowel with a transition zone at the entrance to the hernia. Prior postsurgical changes of the stomach are noted. Vascular/Lymphatic: Aortic atherosclerosis. No enlarged abdominal or pelvic lymph nodes. Reproductive: Prostate is unremarkable. Musculoskeletal:  Postsurgical changes are noted in the lower lumbar spine. No acute bony abnormality is noted. IMPRESSION: 1. Changes of internal hernia within the right mid abdomen with multiple loops of small bowel within. Some venous engorgement is identified although no obstruction of the herniated loops of bowel is noted. There is some more proximal dilatation of the small bowel secondary to the transition zone from the hernia. 2. Mild ascites.  This is likely reactive in nature 3. Chronic changes as described above. Electronically Signed   By: Inez Catalina M.D.   On: 05/10/2017 11:30   Dg Chest Port 1 View  Result Date: 05/10/2017 CLINICAL DATA:  Rule out pneumonia.  Tachycardia, short of breath EXAM: PORTABLE CHEST 1 VIEW COMPARISON:  08/26/2015 FINDINGS: Elevated right hemidiaphragm with right lower lobe atelectasis. Left lung clear. Negative for heart failure IMPRESSION: Right lower lobe atelectasis. Electronically Signed   By: Franchot Gallo M.D.   On: 05/10/2017 11:40    Assessment & Plan:   There are no diagnoses linked to this encounter.   No orders of the defined types were placed in this encounter.    Follow-up: No follow-ups on file.  Walker Kehr, MD

## 2018-05-19 NOTE — Assessment & Plan Note (Signed)
On Buspar 

## 2018-05-19 NOTE — Assessment & Plan Note (Signed)
Class 2, BMI 35.9 Wt watchers

## 2018-05-25 ENCOUNTER — Other Ambulatory Visit: Payer: Self-pay | Admitting: Internal Medicine

## 2018-06-06 ENCOUNTER — Other Ambulatory Visit: Payer: Self-pay | Admitting: Gastroenterology

## 2018-06-06 DIAGNOSIS — K2990 Gastroduodenitis, unspecified, without bleeding: Secondary | ICD-10-CM

## 2018-06-06 DIAGNOSIS — K2270 Barrett's esophagus without dysplasia: Secondary | ICD-10-CM

## 2018-06-06 DIAGNOSIS — K2970 Gastritis, unspecified, without bleeding: Secondary | ICD-10-CM

## 2018-06-06 DIAGNOSIS — K2950 Unspecified chronic gastritis without bleeding: Secondary | ICD-10-CM

## 2018-06-06 DIAGNOSIS — D508 Other iron deficiency anemias: Secondary | ICD-10-CM

## 2018-06-30 ENCOUNTER — Other Ambulatory Visit: Payer: Self-pay | Admitting: Gastroenterology

## 2018-06-30 DIAGNOSIS — K2950 Unspecified chronic gastritis without bleeding: Secondary | ICD-10-CM

## 2018-06-30 DIAGNOSIS — K2990 Gastroduodenitis, unspecified, without bleeding: Secondary | ICD-10-CM

## 2018-06-30 DIAGNOSIS — K2270 Barrett's esophagus without dysplasia: Secondary | ICD-10-CM

## 2018-06-30 DIAGNOSIS — K2970 Gastritis, unspecified, without bleeding: Secondary | ICD-10-CM

## 2018-06-30 DIAGNOSIS — D508 Other iron deficiency anemias: Secondary | ICD-10-CM

## 2018-07-10 ENCOUNTER — Other Ambulatory Visit: Payer: Self-pay | Admitting: Internal Medicine

## 2018-07-15 DIAGNOSIS — H401131 Primary open-angle glaucoma, bilateral, mild stage: Secondary | ICD-10-CM | POA: Diagnosis not present

## 2018-07-22 ENCOUNTER — Other Ambulatory Visit: Payer: Self-pay | Admitting: Gastroenterology

## 2018-07-22 DIAGNOSIS — K2950 Unspecified chronic gastritis without bleeding: Secondary | ICD-10-CM

## 2018-07-22 DIAGNOSIS — K2990 Gastroduodenitis, unspecified, without bleeding: Secondary | ICD-10-CM

## 2018-07-22 DIAGNOSIS — K2270 Barrett's esophagus without dysplasia: Secondary | ICD-10-CM

## 2018-07-22 DIAGNOSIS — K2970 Gastritis, unspecified, without bleeding: Secondary | ICD-10-CM

## 2018-07-22 DIAGNOSIS — D508 Other iron deficiency anemias: Secondary | ICD-10-CM

## 2018-07-23 ENCOUNTER — Ambulatory Visit (INDEPENDENT_AMBULATORY_CARE_PROVIDER_SITE_OTHER): Payer: Medicare Other | Admitting: Gastroenterology

## 2018-07-23 ENCOUNTER — Encounter

## 2018-07-23 ENCOUNTER — Encounter (INDEPENDENT_AMBULATORY_CARE_PROVIDER_SITE_OTHER): Payer: Self-pay

## 2018-07-23 ENCOUNTER — Encounter: Payer: Self-pay | Admitting: Gastroenterology

## 2018-07-23 VITALS — BP 130/80 | HR 74 | Ht 66.0 in | Wt 222.2 lb

## 2018-07-23 DIAGNOSIS — D508 Other iron deficiency anemias: Secondary | ICD-10-CM | POA: Diagnosis not present

## 2018-07-23 DIAGNOSIS — Z86010 Personal history of colonic polyps: Secondary | ICD-10-CM

## 2018-07-23 DIAGNOSIS — K219 Gastro-esophageal reflux disease without esophagitis: Secondary | ICD-10-CM | POA: Diagnosis not present

## 2018-07-23 DIAGNOSIS — K2270 Barrett's esophagus without dysplasia: Secondary | ICD-10-CM | POA: Diagnosis not present

## 2018-07-23 MED ORDER — PANTOPRAZOLE SODIUM 40 MG PO TBEC: 40.0000 mg | DELAYED_RELEASE_TABLET | Freq: Two times a day (BID) | ORAL | 11 refills | Status: DC

## 2018-07-23 NOTE — Progress Notes (Signed)
    History of Present Illness: This is a 72 year old male with Barrett's esophagus.  His reflux symptoms are currently under good control.  He relates very rare episodes of nighttime reflux symptoms if he eats late at night.  He generally waits 3 to 3-1/2 hours after his evening meal and before bedtime.  EGD and colonoscopy performed in 03/2017 and results reviewed.   Current Medications, Allergies, Past Medical History, Past Surgical History, Family History and Social History were reviewed in [employer] record.  Physical Exam: General: Well developed, well nourished, no acute distress Head: Normocephalic and atraumatic Eyes:  sclerae anicteric, EOMI Ears: Normal auditory acuity Mouth: No deformity or lesions Lungs: Clear throughout to auscultation Heart: Regular rate and rhythm; no murmurs, rubs or bruits Abdomen: Soft, non tender and non distended. No masses, hepatosplenomegaly or hernias noted. Normal Bowel sounds Rectal: Not done  Musculoskeletal: Symmetrical with no gross deformities  Pulses:  Normal pulses noted Extremities: No clubbing, cyanosis, edema or deformities noted Neurological: Alert oriented x 4, grossly nonfocal Psychological:  Alert and cooperative. Normal mood and affect   Assessment and Recommendations:  1. Barrett's esophagus without dysplasia. Continue pantoprazole 40 mg po bid.  Follow antireflux measures.  Surveillance EGD 03/2020.  2. Family history of colon cancer. Personal history of adenomatous colon polyps. Colonoscopy in 03/2022.   3. History of iron deficiency anemia.  No clear gastrointestinal etiology identified.  If iron deficiency is recurrent consider capsule endoscopy for further evaluation.

## 2018-07-23 NOTE — Patient Instructions (Signed)
We have sent the following medications to your pharmacy for you to pick up at your convenience: pantoprazole.   Thank you for choosing me and [facility].  Malcolm T. Stark, Jr., MD., FACG   

## 2018-08-19 ENCOUNTER — Encounter: Payer: Self-pay | Admitting: Internal Medicine

## 2018-08-19 ENCOUNTER — Ambulatory Visit (INDEPENDENT_AMBULATORY_CARE_PROVIDER_SITE_OTHER): Payer: Medicare Other | Admitting: Internal Medicine

## 2018-08-19 VITALS — BP 126/72 | HR 77 | Temp 98.9°F | Ht 66.0 in | Wt 221.0 lb

## 2018-08-19 DIAGNOSIS — M519 Unspecified thoracic, thoracolumbar and lumbosacral intervertebral disc disorder: Secondary | ICD-10-CM | POA: Diagnosis not present

## 2018-08-19 DIAGNOSIS — I480 Paroxysmal atrial fibrillation: Secondary | ICD-10-CM

## 2018-08-19 DIAGNOSIS — F411 Generalized anxiety disorder: Secondary | ICD-10-CM | POA: Diagnosis not present

## 2018-08-19 DIAGNOSIS — N320 Bladder-neck obstruction: Secondary | ICD-10-CM

## 2018-08-19 DIAGNOSIS — E785 Hyperlipidemia, unspecified: Secondary | ICD-10-CM | POA: Diagnosis not present

## 2018-08-19 DIAGNOSIS — Z23 Encounter for immunization: Secondary | ICD-10-CM

## 2018-08-19 DIAGNOSIS — I119 Hypertensive heart disease without heart failure: Secondary | ICD-10-CM

## 2018-08-19 DIAGNOSIS — K219 Gastro-esophageal reflux disease without esophagitis: Secondary | ICD-10-CM

## 2018-08-19 DIAGNOSIS — Z6835 Body mass index (BMI) 35.0-35.9, adult: Secondary | ICD-10-CM | POA: Diagnosis not present

## 2018-08-19 MED ORDER — OXYCODONE HCL 15 MG PO TABS: 15.0000 mg | ORAL_TABLET | Freq: Four times a day (QID) | ORAL | 0 refills | Status: DC | PRN

## 2018-08-19 NOTE — Assessment & Plan Note (Signed)
Losartan 

## 2018-08-19 NOTE — Progress Notes (Signed)
Subjective:  Patient ID: Cody Frazier, male    DOB: 1945/11/30  Age: 72 y.o. MRN: [id number]  CC: No chief complaint on file.   HPI Cody Frazier presents for LBP, PUD, HTN f/u  Outpatient Medications Prior to Visit  Medication Sig Dispense Refill  . acetaminophen (TYLENOL) 500 MG tablet Take 1,000 mg by mouth every 6 (six) hours as needed for mild pain or moderate pain.    Marland Kitchen apixaban (ELIQUIS) 2.5 MG TABS tablet Take 1 tablet (2.5 mg total) by mouth 2 (two) times daily. 60 tablet 11  . atorvastatin (LIPITOR) 20 MG tablet TAKE 1 TABLET BY MOUTH EVERY DAY 90 tablet 3  . busPIRone (BUSPAR) 7.5 MG tablet TAKE 1 - 2 TABLETS DAILY AS DIRECTED FOR ANXIETY. 60 tablet 11  . cetirizine (ZYRTEC) 10 MG tablet Take 10 mg by mouth daily.    . clotrimazole-betamethasone (LOTRISONE) cream Apply topically 2 (two) times daily. 60 g 1  . diltiazem (CARDIZEM CD) 180 MG 24 hr capsule Take 1 capsule (180 mg total) by mouth daily. 30 capsule 0  . escitalopram (LEXAPRO) 5 MG tablet Take 1 tablet (5 mg total) by mouth daily. 30 tablet 11  . ferrous sulfate 325 (65 FE) MG tablet Take 325 mg by mouth 2 (two) times daily.      . fluticasone (FLONASE) 50 MCG/ACT nasal spray PLACE 2 SPRAYS INTO BOTH NOSTRILS DAILY. 18 g 11  . latanoprost (XALATAN) 0.005 % ophthalmic solution Place 1 drop into both eyes at bedtime.  11  . metoCLOPramide (REGLAN) 5 MG tablet TAKE 1 TABLET (5 MG TOTAL) BY MOUTH EVERY 8 (EIGHT) HOURS AS NEEDED FOR NAUSEA. 90 tablet 0  . metoprolol tartrate (LOPRESSOR) 25 MG tablet Take 1 tablet (25 mg total) by mouth every 8 (eight) hours. 90 tablet 0  . Multiple Vitamins-Minerals (CENTRUM SILVER PO) Take by mouth daily.      . ondansetron (ZOFRAN) 4 MG tablet TAKE 1 TABLET (4 MG TOTAL) BY MOUTH EVERY 8 (EIGHT) HOURS AS NEEDED FOR NAUSEA OR VOMITING. 20 tablet 0  . oxyCODONE (ROXICODONE) 15 MG immediate release tablet Take 1 tablet (15 mg total) by mouth 4 (four) times daily as needed for pain. For  pain - Fill on or after 07/26/18 Please fill every 30 days 120 tablet 0  . pantoprazole (PROTONIX) 40 MG tablet Take 1 tablet (40 mg total) by mouth 2 (two) times daily. 60 tablet 11  . polyethylene glycol (MIRALAX / GLYCOLAX) packet Take 17 g by mouth daily. 14 each 0  . telmisartan (MICARDIS) 80 MG tablet TAKE 1 TABLET BY MOUTH EVERY DAY 90 tablet 3  . triamcinolone cream (KENALOG) 0.5 % Apply 1 application topically 3 (three) times daily. 90 g 1   No facility-administered medications prior to visit.     ROS: Review of Systems  Constitutional: Negative for appetite change, fatigue and unexpected weight change.  HENT: Negative for congestion, nosebleeds, sneezing, sore throat and trouble swallowing.   Eyes: Negative for itching and visual disturbance.  Respiratory: Negative for cough.   Cardiovascular: Negative for chest pain, palpitations and leg swelling.  Gastrointestinal: Negative for abdominal distention, blood in stool, diarrhea and nausea.  Genitourinary: Negative for frequency and hematuria.  Musculoskeletal: Positive for back pain and gait problem. Negative for joint swelling and neck pain.  Skin: Negative for rash.  Neurological: Negative for dizziness, tremors, speech difficulty and weakness.  Psychiatric/Behavioral: Negative for agitation, dysphoric mood, sleep disturbance and suicidal ideas. The patient  is not nervous/anxious.     Objective:  BP 126/72 (BP Location: Left Arm, Patient Position: Sitting, Cuff Size: Large)   Pulse 77   Temp 98.9 F (37.2 C) (Oral)   Ht 5\' 6"  (1.676 m)   Wt 221 lb (100.2 kg)   SpO2 97%   BMI 35.67 kg/m   BP Readings from Last 3 Encounters:  08/19/18 126/72  07/23/18 130/80  05/19/18 128/78    Wt Readings from Last 3 Encounters:  08/19/18 221 lb (100.2 kg)  07/23/18 222 lb 4 oz (100.8 kg)  05/19/18 220 lb (99.8 kg)    Physical Exam  Constitutional: He is oriented to person, place, and time. He appears well-developed. No  distress.  NAD  HENT:  Mouth/Throat: Oropharynx is clear and moist.  Eyes: Pupils are equal, round, and reactive to light. Conjunctivae are normal.  Neck: Normal range of motion. No JVD present. No thyromegaly present.  Cardiovascular: Normal rate, regular rhythm, normal heart sounds and intact distal pulses. Exam reveals no gallop and no friction rub.  No murmur heard. Pulmonary/Chest: Effort normal and breath sounds normal. No respiratory distress. He has no wheezes. He has no rales. He exhibits no tenderness.  Abdominal: Soft. Bowel sounds are normal. He exhibits no distension and no mass. There is no tenderness. There is no rebound and no guarding.  Musculoskeletal: Normal range of motion. He exhibits tenderness. He exhibits no edema.  Lymphadenopathy:    He has no cervical adenopathy.  Neurological: He is alert and oriented to person, place, and time. He has normal reflexes. No cranial nerve deficit. He exhibits normal muscle tone. He displays a negative Romberg sign. Coordination and gait normal.  Skin: Skin is warm and dry. No rash noted.  Psychiatric: He has a normal mood and affect. His behavior is normal. Judgment and thought content normal.    Lab Results  Component Value Date   WBC 6.5 11/25/2017   HGB 13.8 11/25/2017   HCT 41.3 11/25/2017   PLT 296.0 11/25/2017   GLUCOSE 129 (H) 05/22/2017   CHOL 165 11/25/2017   TRIG 176.0 (H) 11/25/2017   HDL 43.30 11/25/2017   LDLDIRECT 171.0 05/20/2015   LDLCALC 86 11/25/2017   ALT 13 11/25/2017   AST 12 11/25/2017   NA 139 05/22/2017   K 4.2 05/22/2017   CL 105 05/22/2017   CREATININE 1.28 05/22/2017   BUN 19 05/22/2017   CO2 23 05/22/2017   TSH 0.90 11/25/2017   PSA 0.12 02/09/2016   INR 1.11 05/10/2017   HGBA1C 5.7 11/13/2016    Dg Chest 2 View  Result Date: 05/20/2018 CLINICAL DATA:  Cough for 2 months. EXAM: CHEST - 2 VIEW COMPARISON:  Radiographs of May 10, 2017. FINDINGS: The heart size and mediastinal contours  are within normal limits. Both lungs are clear. No pneumothorax or pleural effusion is noted. The visualized skeletal structures are unremarkable. IMPRESSION: No active cardiopulmonary disease. Electronically Signed   By: Marijo Conception, M.D.   On: 05/20/2018 08:02    Assessment & Plan:   There are no diagnoses linked to this encounter.   No orders of the defined types were placed in this encounter.    Follow-up: No follow-ups on file.  Walker Kehr, MD

## 2018-08-19 NOTE — Assessment & Plan Note (Signed)
Buspar

## 2018-08-19 NOTE — Assessment & Plan Note (Signed)
Wt Readings from Last 3 Encounters:  08/19/18 221 lb (100.2 kg)  07/23/18 222 lb 4 oz (100.8 kg)  05/19/18 220 lb (99.8 kg)

## 2018-08-19 NOTE — Assessment & Plan Note (Signed)
Protonix.  ?

## 2018-08-19 NOTE — Assessment & Plan Note (Signed)
Oxycodone   Potential benefits of a long term opioids use as well as potential risks (i.e. addiction risk, apnea etc) and complications (i.e. Somnolence, constipation and others) were explained to the patient and were aknowledged. 

## 2018-08-19 NOTE — Assessment & Plan Note (Signed)
on Eliquis 

## 2018-08-19 NOTE — Addendum Note (Signed)
Addended by: Karren Cobble on: 08/19/2018 03:06 PM   Modules accepted: Orders

## 2018-08-19 NOTE — Patient Instructions (Signed)
[facility] well w/Jill 

## 2018-08-25 ENCOUNTER — Telehealth: Payer: Self-pay | Admitting: Internal Medicine

## 2018-08-25 NOTE — Telephone Encounter (Signed)
Form has been completed & placed in providers box to sign.   Patient is aware Dr.Plot is out of office until 10/21.

## 2018-08-25 NOTE — Telephone Encounter (Signed)
Pt dropped off [place] application for renewal of disability parking placard for completion by Dr. Alain Marion.  Form placed in Brittany's box.  Please call pt at [phone] once form is ready for pick up.

## 2018-09-03 NOTE — Telephone Encounter (Signed)
LVM to inform patient the original is ready to be picked up.   Copy sent to scan.

## 2018-10-14 DIAGNOSIS — H401131 Primary open-angle glaucoma, bilateral, mild stage: Secondary | ICD-10-CM | POA: Diagnosis not present

## 2018-10-14 DIAGNOSIS — H2513 Age-related nuclear cataract, bilateral: Secondary | ICD-10-CM | POA: Diagnosis not present

## 2018-10-22 ENCOUNTER — Ambulatory Visit: Payer: Medicare Other

## 2018-10-22 ENCOUNTER — Other Ambulatory Visit: Payer: Self-pay | Admitting: Cardiology

## 2018-10-22 NOTE — Telephone Encounter (Signed)
° ° °  1. Which medications need to be refilled? (please list name of each medication and dose if known) metoprolol succ er 25mg  1QD  2. Which pharmacy/location (including street and city if local pharmacy) is medication to be sent to? [facility] [address] [facility]  3. Do they need a 30 day or 90 day supply? [place]

## 2018-10-23 MED ORDER — METOPROLOL SUCCINATE ER 25 MG PO TB24: 25.0000 mg | ORAL_TABLET | Freq: Every day | ORAL | 6 refills | Status: DC

## 2018-10-23 NOTE — Telephone Encounter (Signed)
Rx for metoprolol succ ER 25mg  one tablet daily #30 with 6 refills sent to pharmacy as requested.  Patient is due for follow up in July 2020.

## 2018-10-24 NOTE — Progress Notes (Addendum)
Subjective:   Cody Frazier is a 72 y.o. male who presents for Medicare Annual/Subsequent preventive examination.  Review of Systems:  No ROS.  Medicare Wellness Visit. Additional risk factors are reflected in the social history.  Cardiac Risk Factors include: advanced age (>49men, >57 women);dyslipidemia;obesity (BMI >30kg/m2);male gender;hypertension Sleep patterns: feels rested on waking, gets up 1 times nightly to void and sleeps 7-8 hours nightly.    Home Safety/Smoke Alarms: Feels safe in home. Smoke alarms in place.  Living environment; residence and Firearm Safety: [place], can live on one level, no firearms. Lives with wife, no needs for DME, good support system.  Seat Belt Safety/Bike Helmet: Wears seat belt.     Objective:    Vitals: BP (!) 162/96   Pulse 69   Resp 17   Ht 5\' 6"  (1.676 m)   Wt 225 lb (102.1 kg)   SpO2 98%   BMI 36.32 kg/m   Body mass index is 36.32 kg/m.  Advanced Directives 10/27/2018 05/10/2017 05/10/2017 02/22/2017  Does Patient Have a Medical Advance Directive? Yes - Yes Yes  Type of Paramedic of [place];Living will - [place];Living will [place];Living will  Does patient want to make changes to medical advance directive? - - No - Patient declined -  Copy of [place] in Chart? No - copy requested No - copy requested Yes No - copy requested    Tobacco Social History   Tobacco Use  Smoking Status Former Smoker  Smokeless Tobacco Never Used  Tobacco Comment   as a teenager     Counseling given: Not Answered Comment: as a teenager  Past Medical History:  Diagnosis Date  . Allergic rhinitis   . Anemia, iron deficiency   . Atrial arrhythmia   . Atrial fib/flutter, transient    following prior surgeries x 2  . Barrett's esophagus without dysplasia   . BPH (benign prostatic hyperplasia)   . Chronic LBP   . DDD (degenerative disc disease)   .  Diverticulosis of colon   . Duodenal stricture   . Gastric outlet obstruction   . GERD (gastroesophageal reflux disease)   . Hemorrhoids   . Hyperlipidemia   . Hyperplastic colonic polyp 01/2000  . Hypertension   . Nephrolithiasis    hx of B  . Peptic ulcer disease with hemorrhage 08/2008   and [facility]. Pylori Ab negative  . Renal cyst   . Tubular adenoma of colon 03/2012   Past Surgical History:  Procedure Laterality Date  . APPENDECTOMY    . BACK SURGERY  02/2003   L5  . BILROTH I PROCEDURE  2011   Dr Johney Maine  . HIATAL HERNIA REPAIR    . LAPAROTOMY N/A 05/10/2017   Procedure: EXPLORATORY LAPAROTOMY WITH ENTEROLYSIS;  Surgeon: Johnathan Hausen, MD;  Location: [facility];  Service: General;  Laterality: N/A;  . LUMBAR FUSION  2009   Family History  Problem Relation Age of Onset  . Cancer Mother        colon  . Colon polyps Mother   . Colon cancer Mother 29  . Cancer Father        colon and prostate cancer  . Colon polyps Father   . Colon cancer Father 77  . Cancer Paternal Uncle        colon  . Colon cancer Paternal Uncle 52  . Colon cancer Paternal Uncle 36  . Hypertension Other   .  Cancer Cousin        colon  . Stomach cancer Neg Hx    Social History   Socioeconomic History  . Marital status: Married    Spouse name: Cody Frazier  . Number of children: 1  . Years of education: Not on file  . Highest education level: Not on file  Occupational History  . Occupation: Mining engineer: [employer]  Social Needs  . Financial resource strain: Not hard at all  . Food insecurity:    Worry: Never true    Inability: Never true  . Transportation needs:    Medical: No    Non-medical: No  Tobacco Use  . Smoking status: Former Research scientist (life sciences)  . Smokeless tobacco: Never Used  . Tobacco comment: as a teenager  Substance and Sexual Activity  . Alcohol use: No  . Drug use: No  . Sexual activity: Yes  Lifestyle  . Physical activity:    Days per week: 0 days     Minutes per session: 0 min  . Stress: Not at all  Relationships  . Social connections:    Talks on phone: More than three times a week    Gets together: More than three times a week    Attends religious service: More than 4 times per year    Active member of club or organization: Yes    Attends meetings of clubs or organizations: More than 4 times per year    Relationship status: Married  Other Topics Concern  . Not on file  Social History Narrative   Patient gets regular exercise   Married x 42 years    Outpatient Encounter Medications as of 10/27/2018  Medication Sig  . acetaminophen (TYLENOL) 500 MG tablet Take 1,000 mg by mouth every 6 (six) hours as needed for mild pain or moderate pain.  Marland Kitchen apixaban (ELIQUIS) 2.5 MG TABS tablet Take 1 tablet (2.5 mg total) by mouth 2 (two) times daily.  Marland Kitchen atorvastatin (LIPITOR) 20 MG tablet TAKE 1 TABLET BY MOUTH EVERY DAY  . cetirizine (ZYRTEC) 10 MG tablet Take 10 mg by mouth daily.  . clotrimazole-betamethasone (LOTRISONE) cream Apply topically 2 (two) times daily.  Marland Kitchen diltiazem (CARDIZEM CD) 180 MG 24 hr capsule Take 1 capsule (180 mg total) by mouth daily.  Marland Kitchen escitalopram (LEXAPRO) 5 MG tablet Take 1 tablet (5 mg total) by mouth daily.  . fluticasone (FLONASE) 50 MCG/ACT nasal spray PLACE 2 SPRAYS INTO BOTH NOSTRILS DAILY.  Marland Kitchen latanoprost (XALATAN) 0.005 % ophthalmic solution Place 1 drop into both eyes at bedtime.  . metoCLOPramide (REGLAN) 5 MG tablet TAKE 1 TABLET (5 MG TOTAL) BY MOUTH EVERY 8 (EIGHT) HOURS AS NEEDED FOR NAUSEA.  . metoprolol succinate (TOPROL XL) 25 MG 24 hr tablet Take 1 tablet (25 mg total) by mouth daily.  . Multiple Vitamins-Minerals (CENTRUM SILVER PO) Take by mouth daily.    . ondansetron (ZOFRAN) 4 MG tablet TAKE 1 TABLET (4 MG TOTAL) BY MOUTH EVERY 8 (EIGHT) HOURS AS NEEDED FOR NAUSEA OR VOMITING.  Marland Kitchen oxyCODONE (ROXICODONE) 15 MG immediate release tablet Take 1 tablet (15 mg total) by mouth 4 (four) times daily as  needed for pain. For pain - Fill on or after 10/25/18 Please fill every 30 days  . pantoprazole (PROTONIX) 40 MG tablet Take 1 tablet (40 mg total) by mouth 2 (two) times daily.  . polyethylene glycol (MIRALAX / GLYCOLAX) packet Take 17 g by mouth daily.  Marland Kitchen telmisartan (MICARDIS) 80  MG tablet TAKE 1 TABLET BY MOUTH EVERY DAY  . triamcinolone cream (KENALOG) 0.5 % Apply 1 application topically 3 (three) times daily.  . [DISCONTINUED] busPIRone (BUSPAR) 7.5 MG tablet TAKE 1 - 2 TABLETS DAILY AS DIRECTED FOR ANXIETY. (Patient not taking: Reported on 10/27/2018)  . [DISCONTINUED] ferrous sulfate 325 (65 FE) MG tablet Take 325 mg by mouth 2 (two) times daily.     No facility-administered encounter medications on file as of 10/27/2018.     Activities of Daily Living In your present state of health, do you have any difficulty performing the following activities: 10/27/2018  Hearing? N  Vision? N  Difficulty concentrating or making decisions? N  Walking or climbing stairs? N  Dressing or bathing? N  Doing errands, shopping? N  Preparing Food and eating ? N  Using the Toilet? N  In the past six months, have you accidently leaked urine? N  Do you have problems with loss of bowel control? N  Managing your Medications? N  Managing your Finances? N  Housekeeping or managing your Housekeeping? N  Some recent data might be hidden    Patient Care Team: Plotnikov, Evie Lacks, MD as PCP - General Leeroy Cha, MD (Neurosurgery) Michael Boston, MD (General Surgery) Ladene Artist, MD (Gastroenterology) Franchot Gallo, MD as Attending Physician (Urology) Jacolyn Reedy, MD as Consulting Physician (Cardiology)   Assessment:   This is a routine wellness examination for [place]. Physical assessment deferred to PCP.   Exercise Activities and Dietary recommendations Current Exercise Habits: The patient does not participate in regular exercise at present, Exercise limited by: orthopedic  condition(s);neurologic condition(s)  Diet (meal preparation, eat out, water intake, caffeinated beverages, dairy products, fruits and vegetables): in general, a "healthy" diet  . Reports poor appetite at times and supplements with protein drinks.    Discussed continuing to supplement with protein drinks and encouraged patient to increase daily water and healthy fluid intake.  Goals    . Patient Stated     Continue to inspire individuals where ever I go in a positive manner. No matter how small the situation, it is wonderful to know that I made the difference in someone's life.     . Want to lose 10  pounds     Continue to walk on treadmill daily and monitor my diet closely to be as healthy as possible       Fall Risk Fall Risk  10/27/2018 05/19/2018 02/22/2017 02/21/2016 05/26/2015  Falls in the past year? 1 No No No No  Number falls in past yr: 0 - - - -  Injury with Fall? 0 - - - -  Risk for fall due to : Impaired balance/gait;Impaired mobility - - - -  Follow up Falls prevention discussed;Education provided - - - -   Depression Screen PHQ 2/9 Scores 10/27/2018 05/19/2018 02/22/2017 02/21/2016  PHQ - 2 Score 1 1 1 1   PHQ- 9 Score - - 1 -    Cognitive Function MMSE - Mini Mental State Exam 02/22/2017  Orientation to time 5  Orientation to Place 5  Registration 3  Attention/ Calculation 3  Recall 3  Language- name 2 objects 2  Language- repeat 1  Language- follow 3 step command 3  Language- read & follow direction 1  Write a sentence 1  Copy design 1  Total score 28       Ad8 score reviewed for issues:  Issues making decisions: no  Less interest in hobbies / activities:  no  Repeats questions, stories (family complaining): no  Trouble using ordinary gadgets (microwave, computer, phone):no  Forgets the month or year: no  Mismanaging finances: no  Remembering appts: no  Daily problems with thinking and/or memory: no Ad8 score is= 0  Immunization History    Administered Date(s) Administered  . DT 02/09/2011  . Influenza Split 08/13/2011, 08/19/2012  . Influenza Whole 09/26/2009, 08/01/2010  . Influenza, High Dose Seasonal PF 08/25/2013, 10/04/2015, 08/22/2016, 08/23/2017, 08/19/2018  . Influenza,inj,Quad PF,6+ Mos 08/25/2014  . Pneumococcal Conjugate-13 11/22/2014  . Pneumococcal Polysaccharide-23 11/19/2011  . Td 05/26/2015  . Zoster 02/17/2008   Screening Tests Health Maintenance  Topic Date Due  . Hepatitis C Screening  1946-01-05  . COLONOSCOPY  04/10/2022  . TETANUS/TDAP  05/25/2025  . INFLUENZA VACCINE  Completed  . PNA vac Low Risk Adult  Completed       Plan:     Continue doing brain stimulating activities (puzzles, reading, adult coloring books, staying active) to keep memory sharp.   Continue to eat heart healthy diet (full of fruits, vegetables, whole grains, lean protein, water--limit salt, fat, and sugar intake) and increase physical activity as tolerated.   I have personally reviewed and noted the following in the patient's chart:   . Medical and social history . Use of alcohol, tobacco or illicit drugs  . Current medications and supplements . Functional ability and status . Nutritional status . Physical activity . Advanced directives . List of other physicians . Vitals . Screenings to include cognitive, depression, and falls . Referrals and appointments  In addition, I have reviewed and discussed with patient certain preventive protocols, quality metrics, and best practice recommendations. A written personalized care plan for preventive services as well as general preventive health recommendations were provided to patient.     Michiel Cowboy, RN  10/27/2018  Medical screening examination/treatment/procedure(s) were performed by non-physician practitioner and as supervising physician I was immediately available for consultation/collaboration. I agree with above. Lew Dawes, MD

## 2018-10-27 ENCOUNTER — Ambulatory Visit (INDEPENDENT_AMBULATORY_CARE_PROVIDER_SITE_OTHER): Payer: Medicare Other | Admitting: *Deleted

## 2018-10-27 VITALS — BP 162/96 | HR 69 | Resp 17 | Ht 66.0 in | Wt 225.0 lb

## 2018-10-27 DIAGNOSIS — Z0000 Encounter for general adult medical examination without abnormal findings: Secondary | ICD-10-CM

## 2018-10-27 NOTE — Patient Instructions (Addendum)
Continue doing brain stimulating activities (puzzles, reading, adult coloring books, staying active) to keep memory sharp.   Continue to eat heart healthy diet (full of fruits, vegetables, whole grains, lean protein, water--limit salt, fat, and sugar intake) and increase physical activity as tolerated.   [name] , Thank you for taking time to come for your Medicare Wellness Visit. I appreciate your ongoing commitment to your health goals. Please review the following plan we discussed and let me know if I can assist you in the future.   These are the goals we discussed: Goals    . Patient Stated     Continue to inspire individuals where ever I go in a positive manner. No matter how small the situation, it is wonderful to know that I made the difference in someone's life.     . Want to lose 10  pounds     Continue to walk on treadmill daily and monitor my diet closely to be as healthy as possible       This is a list of the screening recommended for you and due dates:  Health Maintenance  Topic Date Due  .  Hepatitis C: One time screening is recommended by Center for Disease Control  (CDC) for  adults born from [birth year] through 1966.   [birth year]  . Colon Cancer Screening  04/10/2022  . Tetanus Vaccine  05/25/2025  . Flu Shot  Completed  . Pneumonia vaccines  Completed     Health Maintenance, Male A healthy lifestyle and preventive care is important for your health and wellness. Ask your health care provider about what schedule of regular examinations is right for you. What should I know about weight and diet? Eat a Healthy Diet  Eat plenty of vegetables, fruits, whole grains, low-fat dairy products, and lean protein.  Do not eat a lot of foods high in solid fats, added sugars, or salt.  Maintain a Healthy Weight Regular exercise can help you achieve or maintain a healthy weight. You should:  Do at least 150 minutes of exercise each week. The exercise should increase your heart  rate and make you sweat (moderate-intensity exercise).  Do strength-training exercises at least twice a week.  Watch Your Levels of Cholesterol and Blood Lipids  Have your blood tested for lipids and cholesterol every 5 years starting at 73 years of age. If you are at high risk for heart disease, you should start having your blood tested when you are 73 years old. You may need to have your cholesterol levels checked more often if: ? Your lipid or cholesterol levels are high. ? You are older than 73 years of age. ? You are at high risk for heart disease.  What should I know about cancer screening? Many types of cancers can be detected early and may often be prevented. Lung Cancer  You should be screened every year for lung cancer if: ? You are a current smoker who has smoked for at least 30 years. ? You are a former smoker who has quit within the past 15 years.  Talk to your health care provider about your screening options, when you should start screening, and how often you should be screened.  Colorectal Cancer  Routine colorectal cancer screening usually begins at 73 years of age and should be repeated every 5-10 years until you are 73 years old. You may need to be screened more often if early forms of precancerous polyps or small growths are found. Your health care  provider may recommend screening at an earlier age if you have risk factors for colon cancer.  Your health care provider may recommend using home test kits to check for hidden blood in the stool.  A small camera at the end of a tube can be used to examine your colon (sigmoidoscopy or colonoscopy). This checks for the earliest forms of colorectal cancer.  Prostate and Testicular Cancer  Depending on your age and overall health, your health care provider may do certain tests to screen for prostate and testicular cancer.  Talk to your health care provider about any symptoms or concerns you have about testicular or prostate  cancer.  Skin Cancer  Check your skin from head to toe regularly.  Tell your health care provider about any new moles or changes in moles, especially if: ? There is a change in a mole's size, shape, or color. ? You have a mole that is larger than a pencil eraser.  Always use sunscreen. Apply sunscreen liberally and repeat throughout the day.  Protect yourself by wearing long sleeves, pants, a wide-brimmed hat, and sunglasses when outside.  What should I know about heart disease, diabetes, and high blood pressure?  If you are 72-37 years of age, have your blood pressure checked every 3-5 years. If you are 48 years of age or older, have your blood pressure checked every year. You should have your blood pressure measured twice-once when you are at a hospital or clinic, and once when you are not at a hospital or clinic. Record the average of the two measurements. To check your blood pressure when you are not at a hospital or clinic, you can use: ? An automated blood pressure machine at a pharmacy. ? A home blood pressure monitor.  Talk to your health care provider about your target blood pressure.  If you are between 81-95 years old, ask your health care provider if you should take aspirin to prevent heart disease.  Have regular diabetes screenings by checking your fasting blood sugar level. ? If you are at a normal weight and have a low risk for diabetes, have this test once every three years after the age of 8. ? If you are overweight and have a high risk for diabetes, consider being tested at a younger age or more often.  A one-time screening for abdominal aortic aneurysm (AAA) by ultrasound is recommended for men aged 60-75 years who are current or former smokers. What should I know about preventing infection? Hepatitis B If you have a higher risk for hepatitis B, you should be screened for this virus. Talk with your health care provider to find out if you are at risk for hepatitis B  infection. Hepatitis C Blood testing is recommended for:  Everyone born from [birth year] through 1966.  Anyone with known risk factors for hepatitis C.  Sexually Transmitted Diseases (STDs)  You should be screened each year for STDs including gonorrhea and chlamydia if: ? You are sexually active and are younger than 73 years of age. ? You are older than 73 years of age and your health care provider tells you that you are at risk for this type of infection. ? Your sexual activity has changed since you were last screened and you are at an increased risk for chlamydia or gonorrhea. Ask your health care provider if you are at risk.  Talk with your health care provider about whether you are at high risk of being infected with HIV. Your health care  provider may recommend a prescription medicine to help prevent HIV infection.  What else can I do?  Schedule regular health, dental, and eye exams.  Stay current with your vaccines (immunizations).  Do not use any tobacco products, such as cigarettes, chewing tobacco, and e-cigarettes. If you need help quitting, ask your health care provider.  Limit alcohol intake to no more than 2 drinks per day. One drink equals 12 ounces of beer, 5 ounces of , or 1 ounces of hard liquor.  Do not use street drugs.  Do not share needles.  Ask your health care provider for help if you need support or information about quitting drugs.  Tell your health care provider if you often feel depressed.  Tell your health care provider if you have ever been abused or do not feel safe at home. This information is not intended to replace advice given to you by your health care provider. Make sure you discuss any questions you have with your health care provider. Document Released: 04/26/2008 Document Revised: 06/27/2016 Document Reviewed: 08/02/2015 Elsevier Interactive Patient Education  [employer].

## 2018-11-10 ENCOUNTER — Other Ambulatory Visit: Payer: Self-pay | Admitting: Internal Medicine

## 2018-11-20 ENCOUNTER — Other Ambulatory Visit (INDEPENDENT_AMBULATORY_CARE_PROVIDER_SITE_OTHER): Payer: Medicare Other

## 2018-11-20 ENCOUNTER — Encounter: Payer: Self-pay | Admitting: Internal Medicine

## 2018-11-20 ENCOUNTER — Ambulatory Visit (INDEPENDENT_AMBULATORY_CARE_PROVIDER_SITE_OTHER): Payer: Medicare Other | Admitting: Internal Medicine

## 2018-11-20 DIAGNOSIS — I480 Paroxysmal atrial fibrillation: Secondary | ICD-10-CM

## 2018-11-20 DIAGNOSIS — E785 Hyperlipidemia, unspecified: Secondary | ICD-10-CM

## 2018-11-20 DIAGNOSIS — F411 Generalized anxiety disorder: Secondary | ICD-10-CM

## 2018-11-20 DIAGNOSIS — N320 Bladder-neck obstruction: Secondary | ICD-10-CM | POA: Diagnosis not present

## 2018-11-20 DIAGNOSIS — M519 Unspecified thoracic, thoracolumbar and lumbosacral intervertebral disc disorder: Secondary | ICD-10-CM

## 2018-11-20 LAB — HEPATIC FUNCTION PANEL
ALT: 13 U/L (ref 0–53)
AST: 13 U/L (ref 0–37)
Albumin: 4.1 g/dL (ref 3.5–5.2)
Alkaline Phosphatase: 125 U/L — ABNORMAL HIGH (ref 39–117)
Bilirubin, Direct: 0.1 mg/dL (ref 0.0–0.3)
Total Bilirubin: 0.3 mg/dL (ref 0.2–1.2)
Total Protein: 6.6 g/dL (ref 6.0–8.3)

## 2018-11-20 LAB — CBC WITH DIFFERENTIAL/PLATELET
Basophils Absolute: 0.1 10*3/uL (ref 0.0–0.1)
Basophils Relative: 0.8 % (ref 0.0–3.0)
Eosinophils Absolute: 0.4 10*3/uL (ref 0.0–0.7)
Eosinophils Relative: 5 % (ref 0.0–5.0)
HCT: 42.1 % (ref 39.0–52.0)
Hemoglobin: 14.2 g/dL (ref 13.0–17.0)
Lymphocytes Relative: 33.3 % (ref 12.0–46.0)
Lymphs Abs: 2.6 10*3/uL (ref 0.7–4.0)
MCHC: 33.7 g/dL (ref 30.0–36.0)
MCV: 93.1 fl (ref 78.0–100.0)
Monocytes Absolute: 0.8 10*3/uL (ref 0.1–1.0)
Monocytes Relative: 10 % (ref 3.0–12.0)
Neutro Abs: 3.9 10*3/uL (ref 1.4–7.7)
Neutrophils Relative %: 50.9 % (ref 43.0–77.0)
Platelets: 350 10*3/uL (ref 150.0–400.0)
RBC: 4.53 Mil/uL (ref 4.22–5.81)
RDW: 13 % (ref 11.5–15.5)
WBC: 7.7 10*3/uL (ref 4.0–10.5)

## 2018-11-20 LAB — URINALYSIS
Bilirubin Urine: NEGATIVE
Hgb urine dipstick: NEGATIVE
Ketones, ur: NEGATIVE
Leukocytes, UA: NEGATIVE
Nitrite: NEGATIVE
Specific Gravity, Urine: 1.015 (ref 1.000–1.030)
Total Protein, Urine: NEGATIVE
Urine Glucose: NEGATIVE
Urobilinogen, UA: 0.2 (ref 0.0–1.0)
pH: 6 (ref 5.0–8.0)

## 2018-11-20 LAB — BASIC METABOLIC PANEL
BUN: 16 mg/dL (ref 6–23)
CO2: 29 mEq/L (ref 19–32)
Calcium: 10 mg/dL (ref 8.4–10.5)
Chloride: 102 mEq/L (ref 96–112)
Creatinine, Ser: 0.99 mg/dL (ref 0.40–1.50)
GFR: 78.72 mL/min (ref >60.00)
Glucose, Bld: 96 mg/dL (ref 70–99)
Potassium: 4.5 mEq/L (ref 3.5–5.1)
Sodium: 140 mEq/L (ref 135–145)

## 2018-11-20 LAB — LIPID PANEL
Cholesterol: 196 mg/dL (ref 0–200)
HDL: 43.6 mg/dL (ref >39.00)
NonHDL: 151.95
Total CHOL/HDL Ratio: 4
Triglycerides: 269 mg/dL — ABNORMAL HIGH (ref 0.0–149.0)
VLDL: 53.8 mg/dL — ABNORMAL HIGH (ref 0.0–40.0)

## 2018-11-20 LAB — TSH: TSH: 1.22 u[IU]/mL (ref 0.35–4.50)

## 2018-11-20 LAB — LDL CHOLESTEROL, DIRECT: Direct LDL: 109 mg/dL

## 2018-11-20 LAB — PSA: PSA: 0.16 ng/mL (ref 0.10–4.00)

## 2018-11-20 MED ORDER — OXYCODONE HCL 15 MG PO TABS: 15.0000 mg | ORAL_TABLET | Freq: Four times a day (QID) | ORAL | 0 refills | Status: DC | PRN

## 2018-11-20 MED ORDER — TELMISARTAN 80 MG PO TABS: 40.0000 mg | ORAL_TABLET | Freq: Two times a day (BID) | ORAL | 3 refills | Status: DC

## 2018-11-20 NOTE — Patient Instructions (Signed)

## 2018-11-20 NOTE — Assessment & Plan Note (Signed)
Lipitor 

## 2018-11-20 NOTE — Assessment & Plan Note (Signed)
Buspar

## 2018-11-20 NOTE — Progress Notes (Signed)
Subjective:  Patient ID: Cody Frazier, male    DOB: [birth year]  Age: 73 y.o. MRN: [id number]  CC: No chief complaint on file.   HPI Cody Frazier presents for LBP, HTN, A fib f/u. No OSA  Outpatient Medications Prior to Visit  Medication Sig Dispense Refill  . acetaminophen (TYLENOL) 500 MG tablet Take 1,000 mg by mouth every 6 (six) hours as needed for mild pain or moderate pain.    Marland Kitchen apixaban (ELIQUIS) 2.5 MG TABS tablet Take 1 tablet (2.5 mg total) by mouth 2 (two) times daily. 60 tablet 11  . atorvastatin (LIPITOR) 20 MG tablet TAKE 1 TABLET BY MOUTH EVERY DAY 90 tablet 3  . cetirizine (ZYRTEC) 10 MG tablet Take 10 mg by mouth daily.    . clotrimazole-betamethasone (LOTRISONE) cream Apply topically 2 (two) times daily. 60 g 1  . diltiazem (CARDIZEM CD) 180 MG 24 hr capsule Take 1 capsule (180 mg total) by mouth daily. 30 capsule 0  . escitalopram (LEXAPRO) 5 MG tablet Take 1 tablet (5 mg total) by mouth daily. 30 tablet 11  . fluticasone (FLONASE) 50 MCG/ACT nasal spray SPRAY 2 SPRAYS INTO EACH NOSTRIL EVERY DAY 48 g 0  . latanoprost (XALATAN) 0.005 % ophthalmic solution Place 1 drop into both eyes at bedtime.  11  . metoCLOPramide (REGLAN) 5 MG tablet TAKE 1 TABLET (5 MG TOTAL) BY MOUTH EVERY 8 (EIGHT) HOURS AS NEEDED FOR NAUSEA. 90 tablet 0  . metoprolol succinate (TOPROL XL) 25 MG 24 hr tablet Take 1 tablet (25 mg total) by mouth daily. 30 tablet 6  . Multiple Vitamins-Minerals (CENTRUM SILVER PO) Take by mouth daily.      . ondansetron (ZOFRAN) 4 MG tablet TAKE 1 TABLET (4 MG TOTAL) BY MOUTH EVERY 8 (EIGHT) HOURS AS NEEDED FOR NAUSEA OR VOMITING. 20 tablet 0  . oxyCODONE (ROXICODONE) 15 MG immediate release tablet Take 1 tablet (15 mg total) by mouth 4 (four) times daily as needed for pain. For pain - Fill on or after 10/25/18 Please fill every 30 days 120 tablet 0  . pantoprazole (PROTONIX) 40 MG tablet Take 1 tablet (40 mg total) by mouth 2 (two) times daily. 60 tablet 11    . polyethylene glycol (MIRALAX / GLYCOLAX) packet Take 17 g by mouth daily. 14 each 0  . telmisartan (MICARDIS) 80 MG tablet TAKE 1 TABLET BY MOUTH EVERY DAY 90 tablet 3  . triamcinolone cream (KENALOG) 0.5 % Apply 1 application topically 3 (three) times daily. 90 g 1   No facility-administered medications prior to visit.     ROS: Review of Systems  Constitutional: Negative for appetite change, fatigue and unexpected weight change.  HENT: Negative for congestion, nosebleeds, sneezing, sore throat and trouble swallowing.   Eyes: Negative for itching and visual disturbance.  Respiratory: Negative for cough.   Cardiovascular: Negative for chest pain, palpitations and leg swelling.  Gastrointestinal: Negative for abdominal distention, blood in stool, diarrhea and nausea.  Genitourinary: Negative for frequency and hematuria.  Musculoskeletal: Positive for arthralgias, back pain and gait problem. Negative for joint swelling and neck pain.  Skin: Negative for rash.  Neurological: Negative for dizziness, tremors, speech difficulty and weakness.  Psychiatric/Behavioral: Negative for agitation, dysphoric mood and sleep disturbance. The patient is not nervous/anxious.     Objective:  BP 132/86 (BP Location: Left Arm, Patient Position: Sitting, Cuff Size: Large)   Pulse 67   Temp 98.2 F (36.8 C) (Oral)   Ht 5\' 6"  (  1.676 m)   Wt 224 lb (101.6 kg)   SpO2 96%   BMI 36.15 kg/m   BP Readings from Last 3 Encounters:  11/20/18 132/86  10/27/18 (!) 162/96  08/19/18 126/72    Wt Readings from Last 3 Encounters:  11/20/18 224 lb (101.6 kg)  10/27/18 225 lb (102.1 kg)  08/19/18 221 lb (100.2 kg)    Physical Exam Constitutional:      General: He is not in acute distress.    Appearance: He is well-developed.     Comments: NAD  Eyes:     Conjunctiva/sclera: Conjunctivae normal.     Pupils: Pupils are equal, round, and reactive to light.  Neck:     Musculoskeletal: Normal range of  motion.     Thyroid: No thyromegaly.     Vascular: No JVD.  Cardiovascular:     Rate and Rhythm: Normal rate and regular rhythm.     Heart sounds: Normal heart sounds. No murmur. No friction rub. No gallop.   Pulmonary:     Effort: Pulmonary effort is normal. No respiratory distress.     Breath sounds: Normal breath sounds. No wheezing or rales.  Chest:     Chest wall: No tenderness.  Abdominal:     General: Bowel sounds are normal. There is no distension.     Palpations: Abdomen is soft. There is no mass.     Tenderness: There is no abdominal tenderness. There is no guarding or rebound.  Musculoskeletal: Normal range of motion.        General: No tenderness.  Lymphadenopathy:     Cervical: No cervical adenopathy.  Skin:    General: Skin is warm and dry.     Findings: No rash.  Neurological:     Mental Status: He is alert and oriented to person, place, and time.     Cranial Nerves: No cranial nerve deficit.     Motor: No abnormal muscle tone.     Coordination: Coordination normal.     Gait: Gait normal.     Deep Tendon Reflexes: Reflexes are normal and symmetric.  Psychiatric:        Behavior: Behavior normal.        Thought Content: Thought content normal.        Judgment: Judgment normal.     Lab Results  Component Value Date   WBC 6.5 11/25/2017   HGB 13.8 11/25/2017   HCT 41.3 11/25/2017   PLT 296.0 11/25/2017   GLUCOSE 129 (H) 05/22/2017   CHOL 165 11/25/2017   TRIG 176.0 (H) 11/25/2017   HDL 43.30 11/25/2017   LDLDIRECT 171.0 05/20/2015   LDLCALC 86 11/25/2017   ALT 13 11/25/2017   AST 12 11/25/2017   NA 139 05/22/2017   K 4.2 05/22/2017   CL 105 05/22/2017   CREATININE 1.28 05/22/2017   BUN 19 05/22/2017   CO2 23 05/22/2017   TSH 0.90 11/25/2017   PSA 0.12 02/09/2016   INR 1.11 05/10/2017   HGBA1C 5.7 11/13/2016    Dg Chest 2 View  Result Date: 05/20/2018 CLINICAL DATA:  Cough for 2 months. EXAM: CHEST - 2 VIEW COMPARISON:  Radiographs of May 10, 2017. FINDINGS: The heart size and mediastinal contours are within normal limits. Both lungs are clear. No pneumothorax or pleural effusion is noted. The visualized skeletal structures are unremarkable. IMPRESSION: No active cardiopulmonary disease. Electronically Signed   By: Marijo Conception, M.D.   On: 05/20/2018 08:02    Assessment &  Plan:   There are no diagnoses linked to this encounter.   No orders of the defined types were placed in this encounter.    Follow-up: No follow-ups on file.  Walker Kehr, MD

## 2018-11-20 NOTE — Assessment & Plan Note (Signed)
Oxycodone   Potential benefits of a long term opioids use as well as potential risks (i.e. addiction risk, apnea etc) and complications (i.e. Somnolence, constipation and others) were explained to the patient and were aknowledged. 

## 2018-11-20 NOTE — Assessment & Plan Note (Signed)
eliquis  

## 2018-11-21 ENCOUNTER — Telehealth: Payer: Self-pay

## 2018-11-21 NOTE — Telephone Encounter (Signed)
I do not think so, however, she needs to check with your surgeon.  He may be asked to hold 1 dose. Thank you

## 2018-11-21 NOTE — Telephone Encounter (Signed)
Copied from [place] [id number]. Topic: General - Other >> Nov 20, 2018  4:49 PM Wynetta Emery, [place] C wrote: Reason for CRM: pt called in to schedule to have a skin tag removed from his eye. Pt says that he is taking a blood thinner. Pt would like to know if he need to do anything in preporation to having tag removed?   CB: [phone]

## 2018-11-24 ENCOUNTER — Other Ambulatory Visit: Payer: Self-pay | Admitting: Cardiology

## 2018-11-24 NOTE — Telephone Encounter (Signed)
° ° °  1. Which medications need to be refilled? (please list name of each medication and dose if known) Diltiazem 24hr ER cd 240mg  capsule  2. Which pharmacy/location (including street and city if local pharmacy) is medication to be sent to? [id number]  3. Do they need a 30 day or 90 day supply? [place]

## 2018-11-24 NOTE — Telephone Encounter (Signed)
Has been given to Dr. Revankar for him to review  

## 2018-11-24 NOTE — Telephone Encounter (Signed)
LM notifying pt

## 2018-11-26 NOTE — Telephone Encounter (Signed)
Gave the [facility] record* not have

## 2018-11-27 MED ORDER — DILTIAZEM HCL ER COATED BEADS 240 MG PO CP24: 240.0000 mg | ORAL_CAPSULE | Freq: Every day | ORAL | 0 refills | Status: DC

## 2018-11-27 NOTE — Telephone Encounter (Signed)
Diltiazem 240 mg daily refilled for 30 days per Dr. Fraser Din.

## 2018-12-02 ENCOUNTER — Telehealth: Payer: Self-pay

## 2018-12-02 DIAGNOSIS — I480 Paroxysmal atrial fibrillation: Secondary | ICD-10-CM

## 2018-12-02 NOTE — Telephone Encounter (Signed)
I'll refer Thx

## 2018-12-02 NOTE — Telephone Encounter (Signed)
Please advise  Copied from [place] [id number]. Topic: Referral - Request for Referral >> Dec 01, 2018  3:51 PM Cody Frazier L wrote: Has patient seen PCP for this complaint? yes *If NO, is insurance requiring patient see PCP for this issue before PCP can refer them? Referral for which specialty: cardiology Preferred provider/office: unknown Reason for referral: Pt states his cardiologist (Dr. Wynonia Lawman) is on medical leave and he would like a recommendation and referral to a new cardiologist that he can transfer to.  Pt can be reached at [phone]

## 2018-12-05 ENCOUNTER — Telehealth: Payer: Self-pay | Admitting: Cardiology

## 2018-12-05 NOTE — Telephone Encounter (Signed)
Patient is a previous [place] patient, he doesn't have an appointment with [place] and doesn't plan to. He wishes to see a cardiologist in [place] that his pcp will refer him too. I informed him that I am not sure if we can advise on this due to him not being seen by [place] and not planning to, I told him I would look into it though.

## 2018-12-05 NOTE — Telephone Encounter (Signed)
Patient is having a growth removed on 1/27 and wants instruction on how to handle his eloquis during this time.

## 2018-12-08 ENCOUNTER — Other Ambulatory Visit: Payer: Medicare Other

## 2018-12-08 ENCOUNTER — Encounter: Payer: Self-pay | Admitting: Internal Medicine

## 2018-12-08 ENCOUNTER — Ambulatory Visit (INDEPENDENT_AMBULATORY_CARE_PROVIDER_SITE_OTHER): Payer: Medicare Other | Admitting: Internal Medicine

## 2018-12-08 VITALS — BP 128/76 | HR 66 | Temp 98.4°F | Ht 66.0 in | Wt 226.0 lb

## 2018-12-08 DIAGNOSIS — D485 Neoplasm of uncertain behavior of skin: Secondary | ICD-10-CM

## 2018-12-08 DIAGNOSIS — L570 Actinic keratosis: Secondary | ICD-10-CM | POA: Diagnosis not present

## 2018-12-08 DIAGNOSIS — L820 Inflamed seborrheic keratosis: Secondary | ICD-10-CM | POA: Diagnosis not present

## 2018-12-08 NOTE — Assessment & Plan Note (Signed)
Skin bx 

## 2018-12-08 NOTE — Progress Notes (Signed)
Subjective:  Patient ID: Cody Frazier, male    DOB: 1946-01-14  Age: 73 y.o. MRN: [id number]  CC: No chief complaint on file.   HPI Cody Frazier presents for c/o skin growth L cheek  Outpatient Medications Prior to Visit  Medication Sig Dispense Refill  . acetaminophen (TYLENOL) 500 MG tablet Take 1,000 mg by mouth every 6 (six) hours as needed for mild pain or moderate pain.    Marland Kitchen apixaban (ELIQUIS) 2.5 MG TABS tablet Take 1 tablet (2.5 mg total) by mouth 2 (two) times daily. 60 tablet 11  . atorvastatin (LIPITOR) 20 MG tablet TAKE 1 TABLET BY MOUTH EVERY DAY 90 tablet 3  . cetirizine (ZYRTEC) 10 MG tablet Take 10 mg by mouth daily.    . clotrimazole-betamethasone (LOTRISONE) cream Apply topically 2 (two) times daily. 60 g 1  . diltiazem (CARDIZEM CD) 240 MG 24 hr capsule Take 1 capsule (240 mg total) by mouth daily. 30 capsule 0  . escitalopram (LEXAPRO) 5 MG tablet Take 1 tablet (5 mg total) by mouth daily. 30 tablet 11  . fluticasone (FLONASE) 50 MCG/ACT nasal spray SPRAY 2 SPRAYS INTO EACH NOSTRIL EVERY DAY 48 g 0  . latanoprost (XALATAN) 0.005 % ophthalmic solution Place 1 drop into both eyes at bedtime.  11  . metoprolol succinate (TOPROL XL) 25 MG 24 hr tablet Take 1 tablet (25 mg total) by mouth daily. 30 tablet 6  . Multiple Vitamins-Minerals (CENTRUM SILVER PO) Take by mouth daily.      . ondansetron (ZOFRAN) 4 MG tablet TAKE 1 TABLET (4 MG TOTAL) BY MOUTH EVERY 8 (EIGHT) HOURS AS NEEDED FOR NAUSEA OR VOMITING. 20 tablet 0  . oxyCODONE (ROXICODONE) 15 MG immediate release tablet Take 1 tablet (15 mg total) by mouth 4 (four) times daily as needed for pain. For pain - Fill on or after 01/25/19 Please fill every 30 days 120 tablet 0  . pantoprazole (PROTONIX) 40 MG tablet Take 1 tablet (40 mg total) by mouth 2 (two) times daily. 60 tablet 11  . polyethylene glycol (MIRALAX / GLYCOLAX) packet Take 17 g by mouth daily. 14 each 0  . telmisartan (MICARDIS) 80 MG tablet Take 0.5  tablets (40 mg total) by mouth 2 (two) times daily. 90 tablet 3  . triamcinolone cream (KENALOG) 0.5 % Apply 1 application topically 3 (three) times daily. 90 g 1   No facility-administered medications prior to visit.     ROS: Review of Systems  Skin: Positive for color change.  Hematological: Bruises/bleeds easily.    Objective:  BP 128/76 (BP Location: Left Arm, Patient Position: Sitting, Cuff Size: Large)   Pulse 66   Temp 98.4 F (36.9 C) (Oral)   Ht 5\' 6"  (1.676 m)   Wt 226 lb (102.5 kg)   SpO2 95%   BMI 36.48 kg/m   BP Readings from Last 3 Encounters:  12/08/18 128/76  11/20/18 132/86  10/27/18 (!) 162/96    Wt Readings from Last 3 Encounters:  12/08/18 226 lb (102.5 kg)  11/20/18 224 lb (101.6 kg)  10/27/18 225 lb (102.1 kg)    Physical Exam  6x11 mm mole L cheek bone   Procedure Note :     Procedure :  Skin biopsy   Indication:  Changing mole (s ),  Suspicious lesion(s)   Risks including unsuccessful procedure , bleeding, infection, bruising, scar, a need for another complete procedure and others were explained to the patient in detail as well as  the benefits. Informed consent was obtained.  The patient was placed in a decubitus position.  Lesion #1 on L cheek    measuring 6x11    mm   Skin over lesion #1  was prepped with Betadine and alcohol  and anesthetized with 1 cc of 2% lidocaine and epinephrine, using a 25-gauge 1 inch needle.  Shave biopsy with a sterile Dermablade was carried out in the usual fashion. Hyfrecator was used to destroy the rest of the lesion potentially left behind and for hemostasis. Band-Aid was applied with antibiotic ointment.    Postprocedure instructions :    A Band-Aid should be  changed twice daily. You can take a shower tomorrow.  Keep the wounds clean. You can wash them with liquid soap and water. Pat dry with gauze or a Kleenex tissue  Before applying antibiotic ointment and a Band-Aid.   You need to report  immediately  if fever, chills or any signs of infection develop.    The biopsy results should be available in 1 -2 weeks.   Lab Results  Component Value Date   WBC 7.7 11/20/2018   HGB 14.2 11/20/2018   HCT 42.1 11/20/2018   PLT 350.0 11/20/2018   GLUCOSE 96 11/20/2018   CHOL 196 11/20/2018   TRIG 269.0 (H) 11/20/2018   HDL 43.60 11/20/2018   LDLDIRECT 109.0 11/20/2018   LDLCALC 86 11/25/2017   ALT 13 11/20/2018   AST 13 11/20/2018   NA 140 11/20/2018   K 4.5 11/20/2018   CL 102 11/20/2018   CREATININE 0.99 11/20/2018   BUN 16 11/20/2018   CO2 29 11/20/2018   TSH 1.22 11/20/2018   PSA 0.16 11/20/2018   INR 1.11 05/10/2017   HGBA1C 5.7 11/13/2016    Dg Chest 2 View  Result Date: 05/20/2018 CLINICAL DATA:  Cough for 2 months. EXAM: CHEST - 2 VIEW COMPARISON:  Radiographs of May 10, 2017. FINDINGS: The heart size and mediastinal contours are within normal limits. Both lungs are clear. No pneumothorax or pleural effusion is noted. The visualized skeletal structures are unremarkable. IMPRESSION: No active cardiopulmonary disease. Electronically Signed   By: Marijo Conception, M.D.   On: 05/20/2018 08:02    Assessment & Plan:   There are no diagnoses linked to this encounter.   No orders of the defined types were placed in this encounter.    Follow-up: No follow-ups on file.  Walker Kehr, MD

## 2018-12-09 NOTE — Telephone Encounter (Signed)
Agree with you! ?

## 2018-12-09 NOTE — Telephone Encounter (Signed)
Confirmed with patient's wife we are unable to advise on this due to him not being seen with [place] an not planning to. Wife understands. Advised them to call back if they change their mind and could call at any time for an appointment

## 2018-12-12 ENCOUNTER — Telehealth: Payer: Self-pay

## 2018-12-12 NOTE — Telephone Encounter (Signed)
Pt.notified

## 2018-12-12 NOTE — Telephone Encounter (Signed)
-----   Message from Cassandria Anger, MD sent at 12/10/2018  7:42 AM EST ----- Inez Catalina, Please inform the patient that his skin biopsy showed a precancerous lesion.  There is no need to do anything else. Thanks, AP

## 2018-12-12 NOTE — Telephone Encounter (Signed)
LMTCB

## 2018-12-23 ENCOUNTER — Other Ambulatory Visit: Payer: Self-pay | Admitting: Cardiology

## 2018-12-26 ENCOUNTER — Emergency Department (HOSPITAL_COMMUNITY): Payer: Medicare Other

## 2018-12-26 ENCOUNTER — Other Ambulatory Visit: Payer: Self-pay

## 2018-12-26 ENCOUNTER — Emergency Department (HOSPITAL_COMMUNITY)
Admission: EM | Admit: 2018-12-26 | Discharge: 2018-12-26 | Disposition: A | Discharge status: Home / Self Care | Payer: Medicare Other | Attending: Emergency Medicine | Admitting: Emergency Medicine

## 2018-12-26 ENCOUNTER — Other Ambulatory Visit: Payer: Self-pay | Admitting: Cardiology

## 2018-12-26 ENCOUNTER — Encounter (HOSPITAL_COMMUNITY): Payer: Self-pay

## 2018-12-26 DIAGNOSIS — S0121XA Laceration without foreign body of nose, initial encounter: Secondary | ICD-10-CM | POA: Insufficient documentation

## 2018-12-26 DIAGNOSIS — S52591A Other fractures of lower end of right radius, initial encounter for closed fracture: Secondary | ICD-10-CM | POA: Diagnosis not present

## 2018-12-26 DIAGNOSIS — Z7901 Long term (current) use of anticoagulants: Secondary | ICD-10-CM | POA: Diagnosis not present

## 2018-12-26 DIAGNOSIS — S199XXA Unspecified injury of neck, initial encounter: Secondary | ICD-10-CM | POA: Diagnosis not present

## 2018-12-26 DIAGNOSIS — W01198A Fall on same level from slipping, tripping and stumbling with subsequent striking against other object, initial encounter: Secondary | ICD-10-CM | POA: Diagnosis not present

## 2018-12-26 DIAGNOSIS — Y92480 Sidewalk as the place of occurrence of the external cause: Secondary | ICD-10-CM | POA: Diagnosis not present

## 2018-12-26 DIAGNOSIS — S0990XA Unspecified injury of head, initial encounter: Secondary | ICD-10-CM | POA: Diagnosis not present

## 2018-12-26 DIAGNOSIS — S0120XA Unspecified open wound of nose, initial encounter: Secondary | ICD-10-CM | POA: Insufficient documentation

## 2018-12-26 DIAGNOSIS — Z23 Encounter for immunization: Secondary | ICD-10-CM | POA: Insufficient documentation

## 2018-12-26 DIAGNOSIS — S0083XA Contusion of other part of head, initial encounter: Secondary | ICD-10-CM | POA: Insufficient documentation

## 2018-12-26 DIAGNOSIS — Z79899 Other long term (current) drug therapy: Secondary | ICD-10-CM | POA: Insufficient documentation

## 2018-12-26 DIAGNOSIS — I10 Essential (primary) hypertension: Secondary | ICD-10-CM | POA: Insufficient documentation

## 2018-12-26 DIAGNOSIS — Z87891 Personal history of nicotine dependence: Secondary | ICD-10-CM | POA: Diagnosis not present

## 2018-12-26 DIAGNOSIS — W19XXXA Unspecified fall, initial encounter: Secondary | ICD-10-CM | POA: Diagnosis not present

## 2018-12-26 DIAGNOSIS — S52571A Other intraarticular fracture of lower end of right radius, initial encounter for closed fracture: Secondary | ICD-10-CM | POA: Diagnosis not present

## 2018-12-26 DIAGNOSIS — Y999 Unspecified external cause status: Secondary | ICD-10-CM | POA: Diagnosis not present

## 2018-12-26 DIAGNOSIS — Y9301 Activity, walking, marching and hiking: Secondary | ICD-10-CM | POA: Insufficient documentation

## 2018-12-26 LAB — TYPE AND SCREEN
ABO/RH(D): O POS
Antibody Screen: NEGATIVE

## 2018-12-26 LAB — CBC WITH DIFFERENTIAL/PLATELET
Abs Immature Granulocytes: 0.03 10*3/uL (ref 0.00–0.07)
Basophils Absolute: 0.1 10*3/uL (ref 0.0–0.1)
Basophils Relative: 1 %
Eosinophils Absolute: 0.3 10*3/uL (ref 0.0–0.5)
Eosinophils Relative: 5 %
HCT: 41.9 % (ref 39.0–52.0)
Hemoglobin: 13.3 g/dL (ref 13.0–17.0)
Immature Granulocytes: 1 %
Lymphocytes Relative: 28 %
Lymphs Abs: 1.6 10*3/uL (ref 0.7–4.0)
MCH: 30 pg (ref 26.0–34.0)
MCHC: 31.7 g/dL (ref 30.0–36.0)
MCV: 94.4 fL (ref 80.0–100.0)
Monocytes Absolute: 0.5 10*3/uL (ref 0.1–1.0)
Monocytes Relative: 9 %
Neutro Abs: 3.3 10*3/uL (ref 1.7–7.7)
Neutrophils Relative %: 56 %
Platelets: 307 10*3/uL (ref 150–400)
RBC: 4.44 MIL/uL (ref 4.22–5.81)
RDW: 12.2 % (ref 11.5–15.5)
WBC: 5.8 10*3/uL (ref 4.0–10.5)
nRBC: 0 % (ref 0.0–0.2)

## 2018-12-26 LAB — BASIC METABOLIC PANEL
Anion gap: 12 (ref 5–15)
BUN: 15 mg/dL (ref 8–23)
CO2: 22 mmol/L (ref 22–32)
Calcium: 9.8 mg/dL (ref 8.9–10.3)
Chloride: 104 mmol/L (ref 98–111)
Creatinine, Ser: 1.07 mg/dL (ref 0.61–1.24)
GFR calc Af Amer: >60 mL/min (ref >60)
GFR calc non Af Amer: >60 mL/min (ref >60)
Glucose, Bld: 124 mg/dL — ABNORMAL HIGH (ref 70–99)
Potassium: 4.3 mmol/L (ref 3.5–5.1)
Sodium: 138 mmol/L (ref 135–145)

## 2018-12-26 MED ORDER — FENTANYL CITRATE (PF) 100 MCG/2ML IJ SOLN: 50.0000 ug | Freq: Once | INTRAMUSCULAR | Status: DC

## 2018-12-26 MED ORDER — CEPHALEXIN 500 MG PO CAPS: 500.0000 mg | ORAL_CAPSULE | Freq: Two times a day (BID) | ORAL | 0 refills | Status: DC

## 2018-12-26 MED ORDER — TETANUS-DIPHTH-ACELL PERTUSSIS 5-2.5-18.5 LF-MCG/0.5 IM SUSP
0.5000 mL | Freq: Once | INTRAMUSCULAR | Status: AC
  Administered 2018-12-26: 0.5 mL via INTRAMUSCULAR
  Filled 2018-12-26: qty 0.5

## 2018-12-26 MED ORDER — BACITRACIN ZINC 500 UNIT/GM EX OINT
1.0000 "application " | TOPICAL_OINTMENT | Freq: Two times a day (BID) | CUTANEOUS | Status: DC
  Administered 2018-12-26: 1 via TOPICAL

## 2018-12-26 MED ORDER — HYDROCODONE-ACETAMINOPHEN 5-325 MG PO TABS: 2.0000 | ORAL_TABLET | Freq: Four times a day (QID) | ORAL | 0 refills | Status: DC | PRN

## 2018-12-26 MED ORDER — HYDROCODONE-ACETAMINOPHEN 5-325 MG PO TABS
2.0000 | ORAL_TABLET | Freq: Once | ORAL | Status: AC
  Administered 2018-12-26: 2 via ORAL
  Filled 2018-12-26: qty 2

## 2018-12-26 MED ORDER — DILTIAZEM HCL ER COATED BEADS 240 MG PO CP24: 240.0000 mg | ORAL_CAPSULE | Freq: Every day | ORAL | 0 refills | Status: DC

## 2018-12-26 NOTE — Progress Notes (Signed)
Orthopedic Tech Progress Note Patient Details:  Cody Frazier 1946-04-20 [id number]  Ortho Devices Type of Ortho Device: Arm sling, Sugartong splint Ortho Device/Splint Location: rue Ortho Device/Splint Interventions: Ordered, Application, Adjustment   Post Interventions Patient Tolerated: Well Instructions Provided: Care of device, Adjustment of device   Karolee Stamps 12/26/2018, 4:16 PM

## 2018-12-26 NOTE — ED Notes (Signed)
Ortho tech in room to apply splint

## 2018-12-26 NOTE — ED Triage Notes (Signed)
[place]. Falling on his face, laceration to his nasal bridge and visual deformity to the Rt wrist. Denies LOC. Arrived to ED with C-Collar in place

## 2018-12-26 NOTE — Discharge Instructions (Addendum)
Keep splint intact and dry.  Keep wrist elevated when possible.  Ice to wrist (over splint) 30 minutes 4x/day.  Hydrocodone for severe pain, tylenol for mild pain  Drink plenty of fluids  Change the dressing twice daily until you follow up with your doctor - the wounds need to be rechecked at your doctor's office within 3 to 4 days.  Please be aware that these wounds are not repairable, they will need to heal from the inside out which means there will be some significant scabbing, drainage but there should not be any pus spreading redness or fevers.  If you do develop those symptoms return to the emergency department.

## 2018-12-26 NOTE — Consult Note (Signed)
Reason for Consult:Right wrist fx Referring Physician: B Evian Frazier is an 73 y.o. male.  HPI: Cody Frazier was walking at [facility] and tripped over a planter box. He put out his right hand to break his fall and had immediate wrist pain and hit his face. He did not lose consciousness. He came to the ED where x-rays showed a right wrist fx and hand surgery was consulted. He is RHD and retired.  Past Medical History:  Diagnosis Date  . Allergic rhinitis   . Anemia, iron deficiency   . Atrial arrhythmia   . Atrial fib/flutter, transient    following prior surgeries x 2  . Barrett's esophagus without dysplasia   . BPH (benign prostatic hyperplasia)   . Chronic LBP   . DDD (degenerative disc disease)   . Diverticulosis of colon   . Duodenal stricture   . Gastric outlet obstruction   . GERD (gastroesophageal reflux disease)   . Hemorrhoids   . Hyperlipidemia   . Hyperplastic colonic polyp 01/2000  . Hypertension   . Nephrolithiasis    hx of Cody  . Peptic ulcer disease with hemorrhage 08/2008   and [facility]. Pylori Ab negative  . Renal cyst   . Tubular adenoma of colon 03/2012    Past Surgical History:  Procedure Laterality Date  . APPENDECTOMY    . BACK SURGERY  02/2003   L5  . BILROTH I PROCEDURE  2011   Dr Cody Frazier  . HIATAL HERNIA REPAIR    . LAPAROTOMY N/A 05/10/2017   Procedure: EXPLORATORY LAPAROTOMY WITH ENTEROLYSIS;  Surgeon: Cody Hausen, MD;  Location: [facility];  Service: General;  Laterality: N/A;  . LUMBAR FUSION  2009    Family History  Problem Relation Age of Onset  . Cancer Mother        colon  . Colon polyps Mother   . Colon cancer Mother 26  . Cancer Father        colon and prostate cancer  . Colon polyps Father   . Colon cancer Father 25  . Cancer Paternal Uncle        colon  . Colon cancer Paternal Uncle 97  . Colon cancer Paternal Uncle 84  . Hypertension Other   . Cancer Cousin        colon  . Stomach cancer Neg Hx     Social History:   reports that he has quit smoking. He has never used smokeless tobacco. He reports that he does not drink alcohol or use drugs.  Allergies:  Allergies  Allergen Reactions  . Codeine Phosphate Swelling  . Nsaids Other (See Comments)    ulcers    Medications: I have reviewed the patient's current medications.  Results for orders placed or performed during the hospital encounter of 12/26/18 (from the past 48 hour(s))  CBC with Differential/Platelet     Status: None   Collection Time: 12/26/18 12:58 PM  Result Value Ref Range   WBC 5.8 4.0 - 10.5 K/uL   RBC 4.44 4.22 - 5.81 MIL/uL   Hemoglobin 13.3 13.0 - 17.0 g/dL   HCT 41.9 39.0 - 52.0 %   MCV 94.4 80.0 - 100.0 fL   MCH 30.0 26.0 - 34.0 pg   MCHC 31.7 30.0 - 36.0 g/dL   RDW 12.2 11.5 - 15.5 %   Platelets 307 150 - 400 K/uL   nRBC 0.0 0.0 - 0.2 %   Neutrophils Relative % 56 %   Neutro  Abs 3.3 1.7 - 7.7 K/uL   Lymphocytes Relative 28 %   Lymphs Abs 1.6 0.7 - 4.0 K/uL   Monocytes Relative 9 %   Monocytes Absolute 0.5 0.1 - 1.0 K/uL   Eosinophils Relative 5 %   Eosinophils Absolute 0.3 0.0 - 0.5 K/uL   Basophils Relative 1 %   Basophils Absolute 0.1 0.0 - 0.1 K/uL   Immature Granulocytes 1 %   Abs Immature Granulocytes 0.03 0.00 - 0.07 K/uL    Comment: Performed at [facility] [address] [place], [place] [address]  Basic metabolic panel     Status: Abnormal   Collection Time: 12/26/18 12:58 PM  Result Value Ref Range   Sodium 138 135 - 145 mmol/L   Potassium 4.3 3.5 - 5.1 mmol/L   Chloride 104 98 - 111 mmol/L   CO2 22 22 - 32 mmol/L   Glucose, Bld 124 (H) 70 - 99 mg/dL   BUN 15 8 - 23 mg/dL   Creatinine, Ser 1.07 0.61 - 1.24 mg/dL   Calcium 9.8 8.9 - 10.3 mg/dL   GFR calc non Af Amer >60 >60 mL/min   GFR calc Af Amer >60 >60 mL/min   Anion gap 12 5 - 15    Comment: Performed at [facility] [address], [place], [place] [address]  Type and screen     Status: None   Collection Time: 12/26/18  12:58 PM  Result Value Ref Range   ABO/RH(D) O POS    Antibody Screen NEG    Sample Expiration      12/29/2018 Performed at [facility], [place] [address], [place], [place] [address]     No results found.  Review of Systems  Constitutional: Negative for weight loss.  HENT: Negative for ear discharge, ear pain, hearing loss and tinnitus.   Eyes: Negative for blurred vision, double vision, photophobia and pain.  Respiratory: Negative for cough, sputum production and shortness of breath.   Cardiovascular: Negative for chest pain.  Gastrointestinal: Negative for abdominal pain, nausea and vomiting.  Genitourinary: Negative for dysuria, flank pain, frequency and urgency.  Musculoskeletal: Positive for joint pain (Right wrist). Negative for back pain, falls, myalgias and neck pain.  Neurological: Negative for dizziness, tingling, sensory change, focal weakness, loss of consciousness and headaches.  Endo/Heme/Allergies: Does not bruise/bleed easily.  Psychiatric/Behavioral: Negative for depression, memory loss and substance abuse. The patient is not nervous/anxious.    Blood pressure (!) 172/87, pulse 85, temperature 98.7 F (37.1 C), temperature source Oral, resp. rate 12, height 5\' 6"  (1.676 m), weight 99.8 kg, SpO2 96 %. Physical Exam  Constitutional: He appears well-developed and well-nourished. No distress.  HENT:  Head: Normocephalic and atraumatic.  Eyes: Conjunctivae are normal. Right eye exhibits no discharge. Left eye exhibits no discharge. No scleral icterus.  Neck: Normal range of motion.  Cardiovascular: Normal rate and regular rhythm.  Respiratory: Effort normal. No respiratory distress.  Musculoskeletal:     Comments: Right shoulder, elbow, wrist, digits- no skin wounds, mod TTP, deformity at wrist  Sens  Ax/R/M/U intact  Mot   Ax/ R/ PIN/ M/ AIN/ U grossly intact  Rad 1+ (likely 2/2 swelling)  Neurological: He is alert.  Skin: Skin is warm and dry. He is not  diaphoretic.  Psychiatric: He has a normal mood and affect. His behavior is normal.    Assessment/Plan: Right wrist fx -- Splint and d/c home as his recent meal will prevent surgery  in a timely fashion. Dr. Brennan Frazier office will call him to set up surgery.    Lisette Abu, PA-C Orthopedic Surgery [phone] 12/26/2018, 1:51 PM

## 2018-12-26 NOTE — ED Provider Notes (Signed)
[facility] EMERGENCY DEPARTMENT Provider Note   CSN: [id number] Arrival date & time: 12/26/18  1237     History   Chief Complaint Chief Complaint  Patient presents with  . Fall  . Wrist injury    HPI Cody Frazier is a 73 y.o. male.  HPI  73 year old male currently on anticoagulation for atrial fibrillation.  He presents after having an accidental fall where he was walking down the sidewalk, his right foot caught a small wall surrounding a flower garden.  He fell forward striking the bridge of his nose and his forehead against the bricks and tried to catch himself with his right hand.  This injury caused an acute onset of pain and swelling to his right wrist as well as a small amount of bleeding from his nasal bridge.  No loss of consciousness, no changes in vision, no numbness, weakness, nausea, seizures or any other complaints including no neck pain back pain chest pain or shortness of breath.  This occurred just prior to arrival, he arrives by ambulance transport who splinted his wrist. And placed a C collar.  Past Medical History:  Diagnosis Date  . Allergic rhinitis   . Anemia, iron deficiency   . Atrial arrhythmia   . Atrial fib/flutter, transient    following prior surgeries x 2  . Barrett's esophagus without dysplasia   . BPH (benign prostatic hyperplasia)   . Chronic LBP   . DDD (degenerative disc disease)   . Diverticulosis of colon   . Duodenal stricture   . Gastric outlet obstruction   . GERD (gastroesophageal reflux disease)   . Hemorrhoids   . Hyperlipidemia   . Hyperplastic colonic polyp 01/2000  . Hypertension   . Nephrolithiasis    hx of B  . Peptic ulcer disease with hemorrhage 08/2008   and GOO H. Pylori Ab negative  . Renal cyst   . Tubular adenoma of colon 03/2012    Patient Active Problem List   Diagnosis Date Noted  . Obesity 05/19/2018  . Paroxysmal atrial fibrillation ([place]) 05/15/2017  . Atherosclerosis of aorta ([place])  05/15/2017  . Pressure injury of skin 05/15/2017  . Internal hernia 05/10/2017  . DOE (dyspnea on exertion) 06/12/2016  . Neoplasm of uncertain behavior of skin 03/01/2013  . History of nephrolithiasis 08/19/2012  . Anxiety state 10/20/2010  . SBO (small bowel obstruction) ([place]) 01/14/2009  . Upper gastrointestinal bleeding 08/12/2008  . History of upper gastrointestinal bleeding 08/12/2008  . GERD 09/11/2007  . Dyslipidemia 06/09/2007  . Hypertensive heart disease without CHF 06/09/2007  . Lumbar disc disease 06/09/2007    Past Surgical History:  Procedure Laterality Date  . APPENDECTOMY    . BACK SURGERY  02/2003   L5  . BILROTH I PROCEDURE  2011   Dr Johney Maine  . HIATAL HERNIA REPAIR    . LAPAROTOMY N/A 05/10/2017   Procedure: EXPLORATORY LAPAROTOMY WITH ENTEROLYSIS;  Surgeon: Johnathan Hausen, MD;  Location: [facility];  Service: General;  Laterality: N/A;  . LUMBAR FUSION  2009        Home Medications    Prior to Admission medications   Medication Sig Start Date End Date Taking? Authorizing Provider  acetaminophen (TYLENOL) 500 MG tablet Take 1,000 mg by mouth every 6 (six) hours as needed for mild pain or moderate pain.    [provider]  apixaban (ELIQUIS) 2.5 MG TABS tablet Take 1 tablet (2.5 mg total) by mouth 2 (two) times daily. 02/17/18  Plotnikov, Evie Lacks, MD  atorvastatin (LIPITOR) 20 MG tablet TAKE 1 TABLET BY MOUTH EVERY DAY 07/10/18   Plotnikov, Evie Lacks, MD  cephALEXin (KEFLEX) 500 MG capsule Take 1 capsule (500 mg total) by mouth 2 (two) times daily for 10 days. 12/26/18 01/05/19  Noemi Chapel, MD  cetirizine (ZYRTEC) 10 MG tablet Take 10 mg by mouth daily.    [provider]  clotrimazole-betamethasone (LOTRISONE) cream Apply topically 2 (two) times daily. 08/22/16   Plotnikov, Evie Lacks, MD  diltiazem (CARDIZEM CD) 240 MG 24 hr capsule Take 1 capsule (240 mg total) by mouth daily. 12/26/18   Croitoru, Mihai, MD  escitalopram (LEXAPRO) 5 MG tablet  Take 1 tablet (5 mg total) by mouth daily. 10/25/17   Plotnikov, Evie Lacks, MD  fluticasone (FLONASE) 50 MCG/ACT nasal spray SPRAY 2 SPRAYS INTO EACH NOSTRIL EVERY DAY 11/10/18   Plotnikov, Evie Lacks, MD  HYDROcodone-acetaminophen (NORCO/VICODIN) 5-325 MG tablet Take 2 tablets by mouth every 6 (six) hours as needed. 12/26/18   Noemi Chapel, MD  latanoprost (XALATAN) 0.005 % ophthalmic solution Place 1 drop into both eyes at bedtime. 03/16/17   [provider]  metoprolol succinate (TOPROL XL) 25 MG 24 hr tablet Take 1 tablet (25 mg total) by mouth daily. 10/23/18   Richardo Priest, MD  Multiple Vitamins-Minerals (CENTRUM SILVER PO) Take by mouth daily.      [provider]  ondansetron (ZOFRAN) 4 MG tablet TAKE 1 TABLET (4 MG TOTAL) BY MOUTH EVERY 8 (EIGHT) HOURS AS NEEDED FOR NAUSEA OR VOMITING. 11/18/15   Plotnikov, Evie Lacks, MD  oxyCODONE (ROXICODONE) 15 MG immediate release tablet Take 1 tablet (15 mg total) by mouth 4 (four) times daily as needed for pain. For pain - Fill on or after 01/25/19 Please fill every 30 days 11/20/18   Plotnikov, Evie Lacks, MD  pantoprazole (PROTONIX) 40 MG tablet Take 1 tablet (40 mg total) by mouth 2 (two) times daily. 07/23/18   Ladene Artist, MD  polyethylene glycol [facility] / Floria Raveling) packet Take 17 g by mouth daily. 05/17/17   Dessa Phi, DO  telmisartan (MICARDIS) 80 MG tablet Take 0.5 tablets (40 mg total) by mouth 2 (two) times daily. 11/20/18   Plotnikov, Evie Lacks, MD  triamcinolone cream (KENALOG) 0.5 % Apply 1 application topically 3 (three) times daily. 11/22/16   Plotnikov, Evie Lacks, MD    Family History Family History  Problem Relation Age of Onset  . Cancer Mother        colon  . Colon polyps Mother   . Colon cancer Mother 40  . Cancer Father        colon and prostate cancer  . Colon polyps Father   . Colon cancer Father 22  . Cancer Paternal Uncle        colon  . Colon cancer Paternal Uncle 52  . Colon cancer Paternal Uncle  71  . Hypertension Other   . Cancer Cousin        colon  . Stomach cancer Neg Hx     Social History Social History   Tobacco Use  . Smoking status: Former Research scientist (life sciences)  . Smokeless tobacco: Never Used  . Tobacco comment: as a teenager  Substance Use Topics  . Alcohol use: No  . Drug use: No     Allergies   Codeine phosphate and Nsaids   Review of Systems Review of Systems  All other systems reviewed and are negative.    Physical Exam  Updated Vital Signs BP (!) 165/101   Pulse 74   Temp 98.7 F (37.1 C) (Oral)   Resp 13   Ht 1.676 m (5\' 6" )   Wt 99.8 kg   SpO2 97%   BMI 35.51 kg/m   Physical Exam Vitals signs and nursing note reviewed.  Constitutional:      General: He is not in acute distress.    Appearance: He is well-developed.  HENT:     Head: Normocephalic.     Comments: Small hematoma to the forehead, small laceration over the nasal bridge no nasal septal hematoma, clear oropharynx with no malocclusion or dental injury.    Nose: No congestion or rhinorrhea.     Mouth/Throat:     Pharynx: No oropharyngeal exudate or posterior oropharyngeal erythema.  Eyes:     General: No scleral icterus.       Right eye: No discharge.        Left eye: No discharge.     Conjunctiva/sclera: Conjunctivae normal.     Pupils: Pupils are equal, round, and reactive to light.  Neck:     Musculoskeletal: Normal range of motion and neck supple.     Thyroid: No thyromegaly.     Vascular: No JVD.  Cardiovascular:     Rate and Rhythm: Normal rate and regular rhythm.     Heart sounds: Normal heart sounds. No murmur. No friction rub. No gallop.   Pulmonary:     Effort: Pulmonary effort is normal. No respiratory distress.     Breath sounds: Normal breath sounds. No wheezing or rales.  Abdominal:     General: Bowel sounds are normal. There is no distension.     Palpations: Abdomen is soft. There is no mass.     Tenderness: There is no abdominal tenderness.  Musculoskeletal:  Normal range of motion.        General: Swelling, tenderness and deformity present.     Comments: There is focal swelling and deformity of the right distal forearm at the wrist.  Normal grip with the right hand  Lymphadenopathy:     Cervical: No cervical adenopathy.  Skin:    General: Skin is warm and dry.     Findings: No erythema or rash.  Neurological:     Mental Status: He is alert.     Coordination: Coordination normal.     Comments: Normal sensation diffusely of the right upper extremity as well as his face and bilateral upper and lower extremities.  Psychiatric:        Behavior: Behavior normal.      ED Treatments / Results  Labs (all labs ordered are listed, but only abnormal results are displayed) Labs Reviewed  BASIC METABOLIC PANEL - Abnormal; Notable for the following components:      Result Value   Glucose, Bld 124 (*)    All other components within normal limits  CBC WITH DIFFERENTIAL/PLATELET  TYPE AND SCREEN    EKG None  Radiology Dg Forearm Right  Result Date: 12/26/2018 CLINICAL DATA:  Pain following fall EXAM: RIGHT FOREARM - 2 VIEW COMPARISON:  Right wrist radiographs December 26, 2018 FINDINGS: Frontal and lateral views were obtained. There is a comminuted fracture of the distal radial metaphysis with impaction and dorsal angulation distally at the fracture site. There is avulsion of the ulnar styloid. No more proximal fractures are evident. No dislocation. No elbow joint effusion. Arthropathy is noted in the lateral wrist region. IMPRESSION: Comminuted fracture with impaction and dorsal angulation  distally involving the distal radial metaphysis. Avulsion ulnar styloid. No more proximal fractures. No dislocation. Arthropathy in the lateral wrist region. No elbow joint effusion. Electronically Signed   By: Lowella Grip III M.D.   On: 12/26/2018 14:02   Dg Wrist Complete Right  Result Date: 12/26/2018 CLINICAL DATA:  Pain following fall EXAM: RIGHT  WRIST - COMPLETE 3+ VIEW COMPARISON:  None. FINDINGS: Frontal, oblique, lateral, and ulnar deviation scaphoid images were obtained. There is a comminuted fracture of the distal radial metaphysis with dorsal angulation and impaction at the fracture site. A fracture fragment extends into the radiocarpal joint. There is avulsion of the ulnar styloid. No dislocation. There is extensive osteoarthritic change in the first carpal-metacarpal and scaphotrapezial joints with remodeling of the trapezium bone. There is extensive calcification in the triangular fibrocartilage. IMPRESSION: Comminuted fracture distal radial metaphysis with extension of fracture fragment into the radiocarpal joint. There is impaction at the fracture site with dorsal angulation distally. There is also avulsion the ulnar styloid. No dislocation. Arthropathy in the scaphotrapezial and first carpal-metacarpal joints with remodeling of the trapezium bone. Extensive calcification in the triangular fibrocartilage region which may represent chronic tear in this area. Electronically Signed   By: Lowella Grip III M.D.   On: 12/26/2018 14:01   Ct Head Wo Contrast  Result Date: 12/26/2018 CLINICAL DATA:  Fall, laceration to nasal bridge EXAM: CT HEAD WITHOUT CONTRAST CT CERVICAL SPINE WITHOUT CONTRAST TECHNIQUE: Multidetector CT imaging of the head and cervical spine was performed following the standard protocol without intravenous contrast. Multiplanar CT image reconstructions of the cervical spine were also generated. COMPARISON:  None. FINDINGS: CT HEAD FINDINGS Brain: No evidence of acute infarction, hemorrhage, hydrocephalus, extra-axial collection or mass lesion/mass effect. Vascular: No hyperdense vessel or unexpected calcification. Skull: Normal. Negative for fracture or focal lesion. There may be subtle fractures of the nasal bones, which are partly excluded (series 5, image 34). Sinuses/Orbits: No acute finding. Other: Soft tissue hematoma  of the left forehead. CT CERVICAL SPINE FINDINGS Alignment: Normal. Skull base and vertebrae: No acute fracture. No primary bone lesion or focal pathologic process. Soft tissues and spinal canal: No prevertebral fluid or swelling. No visible canal hematoma. Disc levels: Mild multilevel disc space height loss and osteophytosis. Upper chest: Negative. Other: None. IMPRESSION: 1.  No acute intracranial pathology. 2. There may be subtle fractures of the nasal bones, which are partly excluded (series 5, image 34). 3.  Soft tissue hematoma of the left forehead. 4.  No fracture or static subluxation of the cervical spine. Electronically Signed   By: Eddie Candle M.D.   On: 12/26/2018 14:02   Ct Cervical Spine Wo Contrast  Result Date: 12/26/2018 CLINICAL DATA:  Fall, laceration to nasal bridge EXAM: CT HEAD WITHOUT CONTRAST CT CERVICAL SPINE WITHOUT CONTRAST TECHNIQUE: Multidetector CT imaging of the head and cervical spine was performed following the standard protocol without intravenous contrast. Multiplanar CT image reconstructions of the cervical spine were also generated. COMPARISON:  None. FINDINGS: CT HEAD FINDINGS Brain: No evidence of acute infarction, hemorrhage, hydrocephalus, extra-axial collection or mass lesion/mass effect. Vascular: No hyperdense vessel or unexpected calcification. Skull: Normal. Negative for fracture or focal lesion. There may be subtle fractures of the nasal bones, which are partly excluded (series 5, image 34). Sinuses/Orbits: No acute finding. Other: Soft tissue hematoma of the left forehead. CT CERVICAL SPINE FINDINGS Alignment: Normal. Skull base and vertebrae: No acute fracture. No primary bone lesion or focal pathologic process. Soft tissues and  spinal canal: No prevertebral fluid or swelling. No visible canal hematoma. Disc levels: Mild multilevel disc space height loss and osteophytosis. Upper chest: Negative. Other: None. IMPRESSION: 1.  No acute intracranial pathology. 2.  There may be subtle fractures of the nasal bones, which are partly excluded (series 5, image 34). 3.  Soft tissue hematoma of the left forehead. 4.  No fracture or static subluxation of the cervical spine. Electronically Signed   By: Eddie Candle M.D.   On: 12/26/2018 14:02    Procedures Procedures (including critical care time)  Medications Ordered in ED Medications  fentaNYL (SUBLIMAZE) injection 50 mcg (50 mcg Intravenous Refused 12/26/18 1305)  Tdap (BOOSTRIX) injection 0.5 mL (0.5 mLs Intramuscular Given 12/26/18 1301)  HYDROcodone-acetaminophen (NORCO/VICODIN) 5-325 MG per tablet 2 tablet (2 tablets Oral Given 12/26/18 1500)     Initial Impression / Assessment and Plan / ED Course  I have reviewed the triage vital signs and the nursing notes.  Pertinent labs & imaging results that were available during my care of the patient were reviewed by me and considered in my medical decision making (see chart for details).  Clinical Course as of Dec 27 1515  Fri Dec 26, 2018  1326 I have personally looked at and interpreted the xrays of the forearm and wrist - there is a fractured intra-articular fracture of the distal radius.    [BM]    Clinical Course User Index [BM] Noemi Chapel, MD    Small laceration over the nasal bridge will need repair, will irrigate, anesthetized and closed.  Right wrist will be imaged to rule out fracture.  The patient is right-hand dominant.  Investigate tetanus status.  Silvestre Gunner PA with ortho has seen patient has has organized for outpatient treatment of the fracture since he has recently eaten and can not have surgery until late tonight.  I have cleaned up the wound on the nasal bridge and there is no laceration - there is a tissue defecit without ability to repair - due to the open injury with nasal bone possible frx, will treat with keflex, pt agreeable - CT head and C Spine negative - pt stable for d/c - he is agreeable - given hydrocodone for pain -  with success.  Splinted by ortho tech, reevaluated after splint in place with good n/v function.  Final Clinical Impressions(s) / ED Diagnoses   Final diagnoses:  Other closed intra-articular fracture of distal end of right radius, initial encounter  Injury of head, initial encounter    ED Discharge Orders         Ordered    cephALEXin (KEFLEX) 500 MG capsule  2 times daily     12/26/18 1516    HYDROcodone-acetaminophen (NORCO/VICODIN) 5-325 MG tablet  Every 6 hours PRN     12/26/18 1516           Noemi Chapel, MD 12/27/18 1730

## 2019-01-01 ENCOUNTER — Encounter: Payer: Self-pay | Admitting: Cardiology

## 2019-01-01 ENCOUNTER — Ambulatory Visit (INDEPENDENT_AMBULATORY_CARE_PROVIDER_SITE_OTHER): Payer: Medicare Other | Admitting: Cardiology

## 2019-01-01 ENCOUNTER — Ambulatory Visit (HOSPITAL_BASED_OUTPATIENT_CLINIC_OR_DEPARTMENT_OTHER)
Admission: RE | Admit: 2019-01-01 | Discharge: 2019-01-01 | Disposition: A | Discharge status: Home / Self Care | Payer: Medicare Other | Source: Ambulatory Visit | Attending: Cardiology | Admitting: Cardiology

## 2019-01-01 VITALS — BP 110/72 | HR 92 | Ht 66.0 in | Wt 228.0 lb

## 2019-01-01 DIAGNOSIS — R0609 Other forms of dyspnea: Secondary | ICD-10-CM

## 2019-01-01 DIAGNOSIS — I272 Pulmonary hypertension, unspecified: Secondary | ICD-10-CM

## 2019-01-01 DIAGNOSIS — I119 Hypertensive heart disease without heart failure: Secondary | ICD-10-CM

## 2019-01-01 DIAGNOSIS — I700 Atherosclerosis of aorta: Secondary | ICD-10-CM | POA: Diagnosis not present

## 2019-01-01 DIAGNOSIS — I480 Paroxysmal atrial fibrillation: Secondary | ICD-10-CM

## 2019-01-01 DIAGNOSIS — R0600 Dyspnea, unspecified: Secondary | ICD-10-CM

## 2019-01-01 MED ORDER — PERFLUTREN LIPID MICROSPHERE
1.0000 mL | INTRAVENOUS | Status: AC | PRN
  Administered 2019-01-01: 4 mL via INTRAVENOUS
  Filled 2019-01-01: qty 10

## 2019-01-01 NOTE — Progress Notes (Signed)
  Echocardiogram 2D Echocardiogram has been performed.  Vanisha Whiten T Larz Mark 01/01/2019, 2:27 PM

## 2019-01-01 NOTE — Patient Instructions (Signed)
Medication Instructions:  Your physician recommends that you continue on your current medications as directed. Please refer to the Current Medication list given to you today.  If you need a refill on your cardiac medications before your next appointment, please call your pharmacy.   Lab work: None.  If you have labs (blood work) drawn today and your tests are completely normal, you will receive your results only by: . MyChart Message (if you have MyChart) OR . A paper copy in the mail If you have any lab test that is abnormal or we need to change your treatment, we will call you to review the results.  Testing/Procedures: Your physician has requested that you have an echocardiogram. Echocardiography is a painless test that uses sound waves to create images of your heart. It provides your doctor with information about the size and shape of your heart and how well your heart's chambers and valves are working. This procedure takes approximately one hour. There are no restrictions for this procedure.    Follow-Up: At [facility], you and your health needs are our priority.  As part of our continuing mission to provide you with exceptional heart care, we have created designated Provider Care Teams.  These Care Teams include your primary Cardiologist (physician) and Advanced Practice Providers (APPs -  Physician Assistants and Nurse Practitioners) who all work together to provide you with the care you need, when you need it. You will need a follow up appointment in 1 months.  Please call our office 2 months in advance to schedule this appointment.  You may see No primary care provider on file. or another member of our [facility] Provider Team in [place]: Brian Munley, MD . Rajan Revankar, MD  Any Other Special Instructions Will Be Listed Below (If Applicable).   Echocardiogram An echocardiogram is a procedure that uses painless sound waves (ultrasound) to produce an image of the heart.  Images from an echocardiogram can provide important information about:  Signs of coronary artery disease (CAD).  Aneurysm detection. An aneurysm is a weak or damaged part of an artery wall that bulges out from the normal force of blood pumping through the body.  Heart size and shape. Changes in the size or shape of the heart can be associated with certain conditions, including heart failure, aneurysm, and CAD.  Heart muscle function.  Heart valve function.  Signs of a past heart attack.  Fluid buildup around the heart.  Thickening of the heart muscle.  A tumor or infectious growth around the heart valves. Tell a health care provider about:  Any allergies you have.  All medicines you are taking, including vitamins, herbs, eye drops, creams, and over-the-counter medicines.  Any blood disorders you have.  Any surgeries you have had.  Any medical conditions you have.  Whether you are pregnant or may be pregnant. What are the risks? Generally, this is a safe procedure. However, problems may occur, including:  Allergic reaction to dye (contrast) that may be used during the procedure. What happens before the procedure? No specific preparation is needed. You may eat and drink normally. What happens during the procedure?   An IV tube may be inserted into one of your veins.  You may receive contrast through this tube. A contrast is an injection that improves the quality of the pictures from your heart.  A gel will be applied to your chest.  A wand-like tool (transducer) will be moved over your chest. The gel will help to   transmit the sound waves from the transducer.  The sound waves will harmlessly bounce off of your heart to allow the heart images to be captured in real-time motion. The images will be recorded on a computer. The procedure may vary among health care providers and hospitals. What happens after the procedure?  You may return to your normal, everyday life,  including diet, activities, and medicines, unless your health care provider tells you not to do that. Summary  An echocardiogram is a procedure that uses painless sound waves (ultrasound) to produce an image of the heart.  Images from an echocardiogram can provide important information about the size and shape of your heart, heart muscle function, heart valve function, and fluid buildup around your heart.  You do not need to do anything to prepare before this procedure. You may eat and drink normally.  After the echocardiogram is completed, you may return to your normal, everyday life, unless your health care provider tells you not to do that. This information is not intended to replace advice given to you by your health care provider. Make sure you discuss any questions you have with your health care provider. Document Released: 10/26/2000 Document Revised: 12/01/2016 Document Reviewed: 12/01/2016 Elsevier Interactive Patient Education  2019 [employer]    

## 2019-01-01 NOTE — Progress Notes (Signed)
Cardiology Office Note:    Date:  01/01/2019   ID:  [name], [name] November 06, 1946, MRN [id number]  PCP:  Cassandria Anger, MD  Cardiologist:  Jenne Campus, MD    Referring MD: Cassandria Anger, MD   Chief Complaint  Patient presents with  . Follow-up  . Pre-op Exam  I need surgery tomorrow  History of Present Illness:    Cody Frazier is a 73 y.o. male with paroxysmal atrial fibrillation, recently he sustained a fall he tripped and he broke his left wrist surgery is planned for tomorrow.  He denies have any cardiac complaints however his ability to exercise is somewhat limited he has 1 flight of stairs at home he is able to walk upstairs however he gets short of breath easily I reviewed his echocardiogram from 2018 which showed pulmonary hypertension of 47 mmHg.  He was completely unaware of this.  Was having any heart trouble except for paroxysmal atrial fibrillation.  Does have any recent episodes denies having any palpitations.  Does have some swelling of lower extremities even on my physical exam.  Past Medical History:  Diagnosis Date  . Allergic rhinitis   . Anemia, iron deficiency   . Atrial arrhythmia   . Atrial fib/flutter, transient    following prior surgeries x 2  . Barrett's esophagus without dysplasia   . BPH (benign prostatic hyperplasia)   . Chronic LBP   . DDD (degenerative disc disease)   . Diverticulosis of colon   . Duodenal stricture   . Gastric outlet obstruction   . GERD (gastroesophageal reflux disease)   . Hemorrhoids   . Hyperlipidemia   . Hyperplastic colonic polyp 01/2000  . Hypertension   . Nephrolithiasis    hx of B  . Peptic ulcer disease with hemorrhage 08/2008   and GOO H. Pylori Ab negative  . Renal cyst   . Tubular adenoma of colon 03/2012    Past Surgical History:  Procedure Laterality Date  . APPENDECTOMY    . BACK SURGERY  02/2003   L5  . BILROTH I PROCEDURE  2011   Dr Johney Maine  . HIATAL HERNIA REPAIR    .  LAPAROTOMY N/A 05/10/2017   Procedure: EXPLORATORY LAPAROTOMY WITH ENTEROLYSIS;  Surgeon: Johnathan Hausen, MD;  Location: [facility];  Service: General;  Laterality: N/A;  . LUMBAR FUSION  2009    Current Medications: Current Meds  Medication Sig  . acetaminophen (TYLENOL) 500 MG tablet Take 1,000 mg by mouth every 6 (six) hours as needed for mild pain or moderate pain.  Marland Kitchen apixaban (ELIQUIS) 2.5 MG TABS tablet Take 1 tablet (2.5 mg total) by mouth 2 (two) times daily.  Marland Kitchen atorvastatin (LIPITOR) 20 MG tablet TAKE 1 TABLET BY MOUTH EVERY DAY  . cetirizine (ZYRTEC) 10 MG tablet Take 10 mg by mouth daily.  Marland Kitchen diltiazem (CARDIZEM CD) 240 MG 24 hr capsule Take 1 capsule (240 mg total) by mouth daily.  . fluticasone (FLONASE) 50 MCG/ACT nasal spray SPRAY 2 SPRAYS INTO EACH NOSTRIL EVERY DAY  . HYDROcodone-acetaminophen (NORCO/VICODIN) 5-325 MG tablet Take 2 tablets by mouth every 6 (six) hours as needed.  . latanoprost (XALATAN) 0.005 % ophthalmic solution Place 1 drop into both eyes at bedtime.  . metoprolol succinate (TOPROL XL) 25 MG 24 hr tablet Take 1 tablet (25 mg total) by mouth daily.  . Multiple Vitamins-Minerals (CENTRUM SILVER PO) Take by mouth daily.    . pantoprazole (PROTONIX) 40 MG tablet Take 1 tablet (  40 mg total) by mouth 2 (two) times daily.  . polyethylene glycol (MIRALAX / GLYCOLAX) packet Take 17 g by mouth daily.  Marland Kitchen telmisartan (MICARDIS) 80 MG tablet Take 0.5 tablets (40 mg total) by mouth 2 (two) times daily.  Marland Kitchen triamcinolone cream (KENALOG) 0.5 % Apply 1 application topically 3 (three) times daily.     Allergies:   Codeine phosphate and Nsaids   Social History   Socioeconomic History  . Marital status: Married    Spouse name: Yamil Oelke  . Number of children: 1  . Years of education: Not on file  . Highest education level: Not on file  Occupational History  . Occupation: Mining engineer: [employer]  Social Needs  . Financial resource strain: Not hard  at all  . Food insecurity:    Worry: Never true    Inability: Never true  . Transportation needs:    Medical: No    Non-medical: No  Tobacco Use  . Smoking status: Former Research scientist (life sciences)  . Smokeless tobacco: Never Used  . Tobacco comment: as a teenager  Substance and Sexual Activity  . Alcohol use: No  . Drug use: No  . Sexual activity: Yes  Lifestyle  . Physical activity:    Days per week: 0 days    Minutes per session: 0 min  . Stress: Not at all  Relationships  . Social connections:    Talks on phone: More than three times a week    Gets together: More than three times a week    Attends religious service: More than 4 times per year    Active member of club or organization: Yes    Attends meetings of clubs or organizations: More than 4 times per year    Relationship status: Married  Other Topics Concern  . Not on file  Social History Narrative   Patient gets regular exercise   Married x 42 years     Family History: The patient's family history includes Cancer in his cousin, father, mother, and paternal uncle; Colon cancer (age of onset: 34) in his paternal uncle and paternal uncle; Colon cancer (age of onset: 53) in his father; Colon cancer (age of onset: 5) in his mother; Colon polyps in his father and mother; Hypertension in an other family member. There is no history of Stomach cancer. ROS:   Please see the history of present illness.    All 14 point review of systems negative except as described per history of present illness  EKGs/Labs/Other Studies Reviewed:      Recent Labs: 11/20/2018: ALT 13; TSH 1.22 12/26/2018: BUN 15; Creatinine, Ser 1.07; Hemoglobin 13.3; Platelets 307; Potassium 4.3; Sodium 138  Recent Lipid Panel    Component Value Date/Time   CHOL 196 11/20/2018 1532   TRIG 269.0 (H) 11/20/2018 1532   TRIG 231 (HH) 09/13/2006 0957   HDL 43.60 11/20/2018 1532   CHOLHDL 4 11/20/2018 1532   VLDL 53.8 (H) 11/20/2018 1532   LDLCALC 86 11/25/2017 1021    LDLDIRECT 109.0 11/20/2018 1532    Physical Exam:    VS:  BP 110/72   Pulse 92   Ht 5\' 6"  (1.676 m)   Wt 228 lb (103.4 kg)   SpO2 94%   BMI 36.80 kg/m     Wt Readings from Last 3 Encounters:  01/01/19 228 lb (103.4 kg)  12/26/18 220 lb (99.8 kg)  12/08/18 226 lb (102.5 kg)     GEN:  Well nourished, well  developed in no acute distress HEENT: Normal NECK: No JVD; No carotid bruits LYMPHATICS: No lymphadenopathy CARDIAC: RRR, no murmurs, no rubs, no gallops RESPIRATORY:  Clear to auscultation without rales, wheezing or rhonchi  ABDOMEN: Soft, non-tender, non-distended MUSCULOSKELETAL:  No edema; No deformity  SKIN: Warm and dry LOWER EXTREMITIES: no swelling NEUROLOGIC:  Alert and oriented x 3 PSYCHIATRIC:  Normal affect   ASSESSMENT:    1. DOE (dyspnea on exertion)   2. Paroxysmal atrial fibrillation (HCC)   3. Hypertensive heart disease without CHF   4. Atherosclerosis of aorta ([place])   5. Pulmonary hypertension ([place])    PLAN:    In order of problems listed above:  1. Dyspnea on exertion.  Probably multifactorial I would like to have echocardiogram to assess left ventricular ejection fraction more importantly to look at right side pressures and function.  I am worried about worsening of pulmonary hypertension.  Obviously trying to find out etiology of this phenomenon will be essential as well. 2. Preop evaluation tomorrow he scheduled to have wrist surgery I will did place a call to his surgeon to talk to him about it question I will have is about type of anesthesia that will be used also I am not sure if his surgeon is aware of the fact that he is taking anticoagulation.  That need to be probably held for at least 24 hours before surgery.  And we are really running out of time for that.  We may be forced to postpone surgery if it is feasible from surgical point of view. 3. Hypertensive heart disease his blood pressure appears to be well controlled today. 4. Pulmonary  hypertension: Echocardiogram will be done to assess that. 5. Paroxysmal atrial fibrillation: Maintaining sinus rhythm.  Anticoagulated which is appropriate which I will continue.  Obviously anticoagulation need to be most likely hold for surgery.   Medication Adjustments/Labs and Tests Ordered: Current medicines are reviewed at length with the patient today.  Concerns regarding medicines are outlined above.  Orders Placed This Encounter  Procedures  . ECHOCARDIOGRAM COMPLETE   Medication changes: No orders of the defined types were placed in this encounter.   Signed, Park Liter, MD, [facility] 01/01/2019 9:04 AM    [place]

## 2019-01-02 LAB — ECHOCARDIOGRAM COMPLETE
Height: 66 in
Weight: 3648 oz

## 2019-01-05 ENCOUNTER — Other Ambulatory Visit: Payer: Self-pay

## 2019-01-05 ENCOUNTER — Encounter (HOSPITAL_COMMUNITY): Payer: Self-pay | Admitting: *Deleted

## 2019-01-05 DIAGNOSIS — S52531A Colles' fracture of right radius, initial encounter for closed fracture: Secondary | ICD-10-CM | POA: Diagnosis not present

## 2019-01-05 NOTE — Progress Notes (Signed)
   01/05/19 1804  OBSTRUCTIVE SLEEP APNEA  Have you ever been diagnosed with sleep apnea through a sleep study? No  Do you snore loudly (loud enough to be heard through closed doors)?  0  Do you often feel tired, fatigued, or sleepy during the daytime (such as falling asleep during driving or talking to someone)? 0  Has anyone observed you stop breathing during your sleep? 0  Do you have, or are you being treated for high blood pressure? 1  BMI more than 35 kg/m2? 1  Age > 50 (1-yes) 1  Neck circumference greater than:Male 16 inches or larger, Male 17inches or larger? 1  Male Gender (Yes=1) 1  Obstructive Sleep Apnea Score 5

## 2019-01-05 NOTE — Progress Notes (Signed)
Pt denies any acute cardiopulmonary issues. Pt under the care of Dr. Agustin Cree, Cardiology. Pt denies having a cardiac cath. Pt denies having an EKG within the last year. Pt stated that last dose of Eliquis was " Sunday morning. " Pt made aware to stop taking vitamins, fish oil and herbal medications. Do not take any NSAIDs ( don't take per pt). Pt verbalized understanding of all pre-op instructions.

## 2019-01-06 ENCOUNTER — Ambulatory Visit (HOSPITAL_COMMUNITY): Payer: Medicare Other | Admitting: Certified Registered Nurse Anesthetist

## 2019-01-06 ENCOUNTER — Encounter (HOSPITAL_COMMUNITY)
Admission: RE | Disposition: A | Discharge status: Home / Self Care | Payer: Self-pay | Source: Ambulatory Visit | Attending: General Surgery

## 2019-01-06 ENCOUNTER — Encounter (HOSPITAL_COMMUNITY): Payer: Self-pay

## 2019-01-06 ENCOUNTER — Ambulatory Visit (HOSPITAL_COMMUNITY)
Admission: RE | Admit: 2019-01-06 | Discharge: 2019-01-06 | Disposition: A | Discharge status: Home / Self Care | Payer: Medicare Other | Source: Ambulatory Visit | Attending: General Surgery | Admitting: General Surgery

## 2019-01-06 DIAGNOSIS — S52611A Displaced fracture of right ulna styloid process, initial encounter for closed fracture: Secondary | ICD-10-CM | POA: Diagnosis not present

## 2019-01-06 DIAGNOSIS — S52531A Colles' fracture of right radius, initial encounter for closed fracture: Secondary | ICD-10-CM | POA: Diagnosis not present

## 2019-01-06 DIAGNOSIS — I10 Essential (primary) hypertension: Secondary | ICD-10-CM | POA: Insufficient documentation

## 2019-01-06 DIAGNOSIS — Z87891 Personal history of nicotine dependence: Secondary | ICD-10-CM | POA: Diagnosis not present

## 2019-01-06 DIAGNOSIS — Z885 Allergy status to narcotic agent status: Secondary | ICD-10-CM | POA: Diagnosis not present

## 2019-01-06 DIAGNOSIS — K219 Gastro-esophageal reflux disease without esophagitis: Secondary | ICD-10-CM | POA: Diagnosis not present

## 2019-01-06 DIAGNOSIS — I4891 Unspecified atrial fibrillation: Secondary | ICD-10-CM | POA: Diagnosis not present

## 2019-01-06 DIAGNOSIS — S52511A Displaced fracture of right radial styloid process, initial encounter for closed fracture: Secondary | ICD-10-CM | POA: Diagnosis not present

## 2019-01-06 DIAGNOSIS — H409 Unspecified glaucoma: Secondary | ICD-10-CM | POA: Insufficient documentation

## 2019-01-06 DIAGNOSIS — Z7901 Long term (current) use of anticoagulants: Secondary | ICD-10-CM | POA: Diagnosis not present

## 2019-01-06 DIAGNOSIS — Z888 Allergy status to other drugs, medicaments and biological substances status: Secondary | ICD-10-CM | POA: Insufficient documentation

## 2019-01-06 DIAGNOSIS — E785 Hyperlipidemia, unspecified: Secondary | ICD-10-CM | POA: Diagnosis not present

## 2019-01-06 DIAGNOSIS — Z7951 Long term (current) use of inhaled steroids: Secondary | ICD-10-CM | POA: Diagnosis not present

## 2019-01-06 DIAGNOSIS — S52501A Unspecified fracture of the lower end of right radius, initial encounter for closed fracture: Secondary | ICD-10-CM | POA: Insufficient documentation

## 2019-01-06 DIAGNOSIS — W19XXXA Unspecified fall, initial encounter: Secondary | ICD-10-CM | POA: Insufficient documentation

## 2019-01-06 DIAGNOSIS — I480 Paroxysmal atrial fibrillation: Secondary | ICD-10-CM | POA: Diagnosis not present

## 2019-01-06 DIAGNOSIS — I119 Hypertensive heart disease without heart failure: Secondary | ICD-10-CM | POA: Diagnosis not present

## 2019-01-06 HISTORY — DX: Unspecified fracture of the lower end of right radius, initial encounter for closed fracture: S52.501A

## 2019-01-06 HISTORY — DX: Unspecified glaucoma: H40.9

## 2019-01-06 HISTORY — DX: Personal history of urinary calculi: Z87.442

## 2019-01-06 HISTORY — DX: Unspecified nephritic syndrome with unspecified morphologic changes: N05.9

## 2019-01-06 HISTORY — DX: Presence of spectacles and contact lenses: Z97.3

## 2019-01-06 HISTORY — PX: CLOSED REDUCTION WRIST FRACTURE: SHX1091

## 2019-01-06 LAB — PROTIME-INR
INR: 1.1 (ref 0.8–1.2)
Prothrombin Time: 14.4 seconds (ref 11.4–15.2)

## 2019-01-06 SURGERY — CLOSED REDUCTION, WRIST
Anesthesia: Regional | Site: Wrist | Laterality: Right

## 2019-01-06 MED ORDER — FENTANYL CITRATE (PF) 100 MCG/2ML IJ SOLN
25.0000 ug | INTRAMUSCULAR | Status: DC | PRN
  Administered 2019-01-06 (×2): 25 ug via INTRAVENOUS

## 2019-01-06 MED ORDER — KETOROLAC TROMETHAMINE 30 MG/ML IJ SOLN
INTRAMUSCULAR | Status: AC
  Filled 2019-01-06: qty 1

## 2019-01-06 MED ORDER — MIDAZOLAM HCL 2 MG/2ML IJ SOLN
INTRAMUSCULAR | Status: AC
  Filled 2019-01-06: qty 2

## 2019-01-06 MED ORDER — ONDANSETRON HCL 4 MG/2ML IJ SOLN
INTRAMUSCULAR | Status: AC
  Filled 2019-01-06: qty 2

## 2019-01-06 MED ORDER — CEFAZOLIN SODIUM-DEXTROSE 2-4 GM/100ML-% IV SOLN
INTRAVENOUS | Status: AC
  Filled 2019-01-06: qty 100

## 2019-01-06 MED ORDER — DEXAMETHASONE SODIUM PHOSPHATE 4 MG/ML IJ SOLN
INTRAMUSCULAR | Status: DC | PRN
  Administered 2019-01-06: 5 mg via INTRAVENOUS

## 2019-01-06 MED ORDER — CEFAZOLIN SODIUM 1 G IJ SOLR
INTRAMUSCULAR | Status: AC
  Filled 2019-01-06: qty 10

## 2019-01-06 MED ORDER — PROPOFOL 500 MG/50ML IV EMUL
INTRAVENOUS | Status: DC | PRN
  Administered 2019-01-06: 75 ug/kg/min via INTRAVENOUS

## 2019-01-06 MED ORDER — ONDANSETRON HCL 4 MG/2ML IJ SOLN: 4.0000 mg | Freq: Once | INTRAMUSCULAR | Status: DC | PRN

## 2019-01-06 MED ORDER — FENTANYL CITRATE (PF) 100 MCG/2ML IJ SOLN
INTRAMUSCULAR | Status: AC
  Filled 2019-01-06: qty 2

## 2019-01-06 MED ORDER — CEFAZOLIN (ANCEF) 1 G IV SOLR
2.0000 g | INTRAVENOUS | Status: AC
  Administered 2019-01-06: 2 g
  Filled 2019-01-06: qty 2

## 2019-01-06 MED ORDER — FENTANYL CITRATE (PF) 100 MCG/2ML IJ SOLN
INTRAMUSCULAR | Status: DC | PRN
  Administered 2019-01-06 (×2): 50 ug via INTRAVENOUS

## 2019-01-06 MED ORDER — LIDOCAINE HCL (CARDIAC) PF 100 MG/5ML IV SOSY
PREFILLED_SYRINGE | INTRAVENOUS | Status: DC | PRN
  Administered 2019-01-06: 100 mg via INTRAVENOUS

## 2019-01-06 MED ORDER — GLYCOPYRROLATE PF 0.2 MG/ML IJ SOSY
PREFILLED_SYRINGE | INTRAMUSCULAR | Status: AC
  Filled 2019-01-06: qty 1

## 2019-01-06 MED ORDER — LIDOCAINE-EPINEPHRINE (PF) 1.5 %-1:200000 IJ SOLN
INTRAMUSCULAR | Status: DC | PRN
  Administered 2019-01-06: 30 mL via PERINEURAL

## 2019-01-06 MED ORDER — PROPOFOL 10 MG/ML IV BOLUS
INTRAVENOUS | Status: AC
  Filled 2019-01-06: qty 20

## 2019-01-06 MED ORDER — FENTANYL CITRATE (PF) 250 MCG/5ML IJ SOLN
INTRAMUSCULAR | Status: AC
  Filled 2019-01-06: qty 5

## 2019-01-06 MED ORDER — DEXAMETHASONE SODIUM PHOSPHATE 10 MG/ML IJ SOLN
INTRAMUSCULAR | Status: AC
  Filled 2019-01-06: qty 1

## 2019-01-06 MED ORDER — LACTATED RINGERS IV SOLN
INTRAVENOUS | Status: DC
  Administered 2019-01-06: 15:00:00 via INTRAVENOUS

## 2019-01-06 MED ORDER — ONDANSETRON HCL 4 MG/2ML IJ SOLN
INTRAMUSCULAR | Status: DC | PRN
  Administered 2019-01-06: 4 mg via INTRAVENOUS

## 2019-01-06 MED ORDER — PROPOFOL 10 MG/ML IV BOLUS
INTRAVENOUS | Status: DC | PRN
  Administered 2019-01-06: 15 mg via INTRAVENOUS
  Administered 2019-01-06 (×2): 20 mg via INTRAVENOUS

## 2019-01-06 MED ORDER — 0.9 % SODIUM CHLORIDE (POUR BTL) OPTIME
TOPICAL | Status: DC | PRN
  Administered 2019-01-06: 1000 mL

## 2019-01-06 MED ORDER — MIDAZOLAM HCL 2 MG/2ML IJ SOLN
INTRAMUSCULAR | Status: DC | PRN
  Administered 2019-01-06: 1 mg via INTRAVENOUS

## 2019-01-06 MED ORDER — LIDOCAINE 2% (20 MG/ML) 5 ML SYRINGE
INTRAMUSCULAR | Status: AC
  Filled 2019-01-06: qty 5

## 2019-01-06 SURGICAL SUPPLY — 46 items
BANDAGE ACE 3X5.8 VEL STRL LF (GAUZE/BANDAGES/DRESSINGS) ×3 IMPLANT
BANDAGE ACE 4X5 VEL STRL LF (GAUZE/BANDAGES/DRESSINGS) ×1 IMPLANT
BNDG CMPR 9X4 STRL LF SNTH (GAUZE/BANDAGES/DRESSINGS)
BNDG ESMARK 4X9 LF (GAUZE/BANDAGES/DRESSINGS) IMPLANT
CANISTER SUCTION 1500CC (MISCELLANEOUS) ×2 IMPLANT
CHLORAPREP W/TINT 26ML (MISCELLANEOUS) ×3 IMPLANT
CORDS BIPOLAR (ELECTRODE) ×3 IMPLANT
COVER SURGICAL LIGHT HANDLE (MISCELLANEOUS) ×3 IMPLANT
COVER WAND RF STERILE (DRAPES) ×2 IMPLANT
CUFF TOURNIQUET SINGLE 18IN (TOURNIQUET CUFF) ×2 IMPLANT
CUFF TOURNIQUET SINGLE 24IN (TOURNIQUET CUFF) IMPLANT
DRAIN TLS ROUND 10FR (DRAIN) IMPLANT
DRAPE OEC MINIVIEW 54X84 (DRAPES) ×3 IMPLANT
DRAPE SURG 17X23 STRL (DRAPES) ×3 IMPLANT
DRSG XEROFORM 1X8 (GAUZE/BANDAGES/DRESSINGS) ×1 IMPLANT
GAUZE SPONGE 4X4 12PLY STRL (GAUZE/BANDAGES/DRESSINGS) ×1 IMPLANT
GAUZE XEROFORM 1X8 LF (GAUZE/BANDAGES/DRESSINGS) ×3 IMPLANT
GLOVE BIOGEL M 8.0 STRL (GLOVE) ×3 IMPLANT
GOWN STRL REUS W/ TWL XL LVL3 (GOWN DISPOSABLE) ×4 IMPLANT
GOWN STRL REUS W/TWL XL LVL3 (GOWN DISPOSABLE) ×6
KIT BASIN OR (CUSTOM PROCEDURE TRAY) ×3 IMPLANT
NEEDLE HYPO 22GX1.5 SAFETY (NEEDLE) IMPLANT
NS IRRIG 1000ML POUR BTL (IV SOLUTION) ×3 IMPLANT
PACK ORTHO EXTREMITY (CUSTOM PROCEDURE TRAY) ×3 IMPLANT
PAD CAST 3X4 CTTN HI CHSV (CAST SUPPLIES) IMPLANT
PAD CAST 4YDX4 CTTN HI CHSV (CAST SUPPLIES) IMPLANT
PADDING CAST ABS 4INX4YD NS (CAST SUPPLIES)
PADDING CAST ABS COTTON 4X4 ST (CAST SUPPLIES) ×2 IMPLANT
PADDING CAST COTTON 3X4 STRL (CAST SUPPLIES) ×3
PADDING CAST COTTON 4X4 STRL (CAST SUPPLIES) ×3
PIN CAPS ORTHO GREEN .062 (PIN) ×3 IMPLANT
SLING ARM FOAM STRAP LRG (SOFTGOODS) ×1 IMPLANT
SPLINT FIBERGLASS 4X30 (CAST SUPPLIES) ×1 IMPLANT
SPLINT PLASTER CAST XFAST 4X15 (CAST SUPPLIES) IMPLANT
SPLINT PLASTER XTRA FAST SET 4 (CAST SUPPLIES)
SUT ETHILON 3 0 PS 1 (SUTURE) ×3 IMPLANT
SUT ETHILON 4 0 PS 2 18 (SUTURE) ×3 IMPLANT
SUT VIC AB 3-0 PS1 18 (SUTURE) ×3
SUT VIC AB 3-0 PS1 18XBRD (SUTURE) ×2 IMPLANT
SUT VICRYL 4-0 PS2 18IN ABS (SUTURE) ×3 IMPLANT
SYR BULB 3OZ (MISCELLANEOUS) ×2 IMPLANT
SYR CONTROL 10ML LL (SYRINGE) IMPLANT
TOWEL OR 17X24 6PK STRL BLUE (TOWEL DISPOSABLE) ×3 IMPLANT
TUBE CONNECTING 20X1/4 (TUBING) ×3 IMPLANT
UNDERPAD 30X30 (UNDERPADS AND DIAPERS) ×3 IMPLANT
WATER STERILE IRR 1000ML POUR (IV SOLUTION) ×2 IMPLANT

## 2019-01-06 NOTE — Anesthesia Procedure Notes (Signed)
Anesthesia Regional Block: Supraclavicular block   Pre-Anesthetic Checklist: ,, timeout performed, Correct Patient, Correct Site, Correct Laterality, Correct Procedure, Correct Position, site marked, Risks and benefits discussed,  Surgical consent,  Pre-op evaluation,  At surgeon's request and post-op pain management  Laterality: Right  Prep: chloraprep       Needles:  Injection technique: Single-shot  Needle Type: Echogenic Stimulator Needle     Needle Length: 9cm  Needle Gauge: 21     Additional Needles:   Procedures:,,,, ultrasound used (permanent image in chart),,,,  Narrative:  Start time: 01/06/2019 2:50 PM End time: 01/06/2019 3:00 PM Injection made incrementally with aspirations every 5 mL.  Performed by: Personally  Anesthesiologist: Murvin Natal, MD  Additional Notes: Functioning IV was confirmed and monitors were applied.  A timeout was performed. Sterile prep, hand hygiene and sterile gloves were used. A 30mm 21ga Arrow echogenic stimulator needle was used. Negative aspiration and negative test dose prior to incremental administration of local anesthetic. The patient tolerated the procedure well.  Ultrasound guidance: relevent anatomy identified, needle position confirmed, local anesthetic spread visualized around nerve(s), vascular puncture avoided.  Image printed for medical record.

## 2019-01-06 NOTE — Transfer of Care (Signed)
Immediate Anesthesia Transfer of Care Note  Patient: Cody Frazier  Procedure(s) Performed: CLOSED REDUCTION/PINNING RIGHT DISTAL RADIUS FRACTURE (Right Wrist)  Patient Location: PACU  Anesthesia Type:MAC combined with regional for post-op pain  Level of Consciousness: awake, alert  and oriented  Airway & Oxygen Therapy: Patient Spontanous Breathing and Patient connected to face mask oxygen  Post-op Assessment: Report given to RN, Post -op Vital signs reviewed and stable and Patient moving all extremities  Post vital signs: Reviewed and stable  Last Vitals:  Vitals Value Taken Time  BP 142/72 01/06/2019  4:01 PM  Temp 36.4 C 01/06/2019  4:01 PM  Pulse 71 01/06/2019  4:11 PM  Resp 15 01/06/2019  4:11 PM  SpO2 94 % 01/06/2019  4:11 PM  Vitals shown include unvalidated device data.  Last Pain:  Vitals:   01/06/19 1601  TempSrc:   PainSc: Asleep      Patients Stated Pain Goal: 0 (67/70/34 0352)  Complications: No apparent anesthesia complications

## 2019-01-06 NOTE — Discharge Instructions (Signed)
Discharge Instructions:  Keep your dressing clean, dry and in place until instructed to remove by Dr. Avanish Cerullo.  If the dressing becomes dirty or wet call the office for instructions during business hours. Elevate the extremity to help with swelling, this will also help with any discomfort. Take your medication as prescribed. No lifting with the injured  extremity. If you feel that the dressing is too tight, you may loosen it, but keep it on; finger tips should be pink; if there is a concern, call the office. [phone] Ice may be used if the injury is a fracture, do not apply ice directly to the skin. Please call the office on the next business day after discharge to arrange a follow up appointment.  Call [phone] between the hours of 9am - 5pm M-Th or 9am - 1pm on Fri. For most hand injuries and/or conditions, you may return to work using the uninjured hand (one handed duty) within 24-72 hours.  A detailed note will be provided to you at your follow up appointment or may contact the office prior to your follow up.    

## 2019-01-06 NOTE — Anesthesia Preprocedure Evaluation (Addendum)
Anesthesia Evaluation  Patient identified by MRN, date of birth, ID band Patient awake    Reviewed: Allergy & Precautions, NPO status , Patient's Chart, lab work & pertinent test results, reviewed documented beta blocker date and time   Airway Mallampati: III  TM Distance: >3 FB Neck ROM: Full    Dental no notable dental hx.    Pulmonary shortness of breath and with exertion, former smoker,    Pulmonary exam normal breath sounds clear to auscultation       Cardiovascular hypertension, Pt. on home beta blockers + DOE  Normal cardiovascular exam+ dysrhythmias Atrial Fibrillation  Rhythm:Regular Rate:Normal  ECG: NSR, rate 77  ECHO: 1. The left ventricle has normal systolic function with an ejection fraction of 60-65%. The cavity size was normal. Left ventricular diastolic Doppler parameters are consistent with impaired relaxation. 2. The right ventricle has normal systolic function. The cavity was mildly enlarged. There is no increase in right ventricular wall thickness. 3. Left atrial size was moderately dilated. 4. The mitral valve is normal in structure. 5. The tricuspid valve is normal in structure. 6. The aortic valve is tricuspid Mild thickening of the aortic valve Mild calcification of the aortic valve. 7. The pulmonic valve was normal in structure. Pulmonic valve regurgitation is mild by color flow Doppler. 8. There is mild to moderate dilatation of the ascending aorta measuring 39 mm.  Pt under the care of Dr. Agustin Cree, Cardiology   Neuro/Psych Anxiety negative neurological ROS     GI/Hepatic Neg liver ROS, PUD, GERD  Medicated and Controlled,  Endo/Other  negative endocrine ROS  Renal/GU negative Renal ROS     Musculoskeletal Chronic LBP   Abdominal (+) + obese,   Peds  Hematology negative hematology ROS (+) HLD   Anesthesia Other Findings RIGHT DISTAL RADIUS FRACTURE  Reproductive/Obstetrics                            Anesthesia Physical Anesthesia Plan  ASA: III  Anesthesia Plan: Regional   Post-op Pain Management:    Induction: Intravenous  PONV Risk Score and Plan: 1  Airway Management Planned: Natural Airway  Additional Equipment:   Intra-op Plan:   Post-operative Plan:   Informed Consent: I have reviewed the patients History and Physical, chart, labs and discussed the procedure including the risks, benefits and alternatives for the proposed anesthesia with the patient or authorized representative who has indicated his/her understanding and acceptance.     Dental advisory given  Plan Discussed with: CRNA  Anesthesia Plan Comments: (Patient took acetaminophen 650 mg at 11:00)       Anesthesia Quick Evaluation

## 2019-01-06 NOTE — Anesthesia Postprocedure Evaluation (Signed)
Anesthesia Post Note  Patient: Cody Frazier  Procedure(s) Performed: Closed Reduction Wrist/Pinning Wrist (Right Wrist)     Patient location during evaluation: PACU Anesthesia Type: Regional Level of consciousness: awake and alert Pain management: pain level controlled Vital Signs Assessment: post-procedure vital signs reviewed and stable Respiratory status: spontaneous breathing, nonlabored ventilation, respiratory function stable and patient connected to nasal cannula oxygen Cardiovascular status: stable and blood pressure returned to baseline Postop Assessment: no apparent nausea or vomiting Anesthetic complications: no    Last Vitals:  Vitals:   01/06/19 1630 01/06/19 1643  BP: (!) 142/88 (!) 147/89  Pulse: 69 65  Resp: 12 14  Temp: 36.4 C 36.4 C  SpO2: 93% 94%    Last Pain:  Vitals:   01/06/19 1643  TempSrc:   PainSc: 1                  Ryan P Ellender

## 2019-01-06 NOTE — H&P (Signed)
CC:I broke my wrist  HPI:  Cody Frazier is an 73 y.o. right handed male who presents with R distal radius fracture from a mechanical fall ~1wk ago.  Pt has recently seen a Cardiologist for preparation of wrist surgery; he has stopped his anticoagulant.  Please see their notes.  The anesthesia provider has seen and evaluated the patient as well.     Past Medical History:  Diagnosis Date  . Allergic rhinitis   . Anemia, iron deficiency   . Atrial arrhythmia   . Atrial fib/flutter, transient    following prior surgeries x 2  . Barrett's esophagus without dysplasia   . BPH (benign prostatic hyperplasia)   . Bright's disease   . Chronic LBP   . Closed fracture of right distal radius   . DDD (degenerative disc disease)   . Diverticulosis of colon   . Duodenal stricture   . Gastric outlet obstruction   . GERD (gastroesophageal reflux disease)   . Glaucoma   . Hemorrhoids   . History of kidney stones   . Hyperlipidemia   . Hyperplastic colonic polyp 01/2000  . Hypertension   . Nephrolithiasis    hx of B  . Peptic ulcer disease with hemorrhage 08/2008   and GOO H. Pylori Ab negative  . Renal cyst   . Tubular adenoma of colon 03/2012  . Wears glasses     Past Surgical History:  Procedure Laterality Date  . APPENDECTOMY    . BACK SURGERY  02/2003   L5  . BILROTH I PROCEDURE  2011   Dr Johney Maine  . COLONOSCOPY    . HIATAL HERNIA REPAIR    . LAPAROTOMY N/A 05/10/2017   Procedure: EXPLORATORY LAPAROTOMY WITH ENTEROLYSIS;  Surgeon: Johnathan Hausen, MD;  Location: [facility];  Service: General;  Laterality: N/A;  . LUMBAR FUSION  2009  . TONSILLECTOMY      Family History  Problem Relation Age of Onset  . Cancer Mother        colon  . Colon polyps Mother   . Colon cancer Mother 23  . Cancer Father        colon and prostate cancer  . Colon polyps Father   . Colon cancer Father 61  . Cancer Paternal Uncle        colon  . Colon cancer Paternal Uncle 40  . Colon cancer  Paternal Uncle 43  . Hypertension Other   . Cancer Cousin        colon  . Stomach cancer Neg Hx     Social History:  reports that he has quit smoking. He has never used smokeless tobacco. He reports that he does not drink alcohol or use drugs.  Allergies:  Allergies  Allergen Reactions  . Codeine Phosphate Swelling  . Nsaids Other (See Comments)    ulcers  . Adhesive [Tape] Rash    ekg strips    Medications: I have reviewed the patient's current medications.  Results for orders placed or performed during the hospital encounter of 01/06/19 (from the past 48 hour(s))  Protime-INR     Status: None   Collection Time: 01/06/19  1:22 PM  Result Value Ref Range   Prothrombin Time 14.4 11.4 - 15.2 seconds   INR 1.1 0.8 - 1.2    Comment: (NOTE) INR goal varies based on device and disease states. Performed at [facility], [place] [address], [place], [place] [address]     No results found.  Pertinent items are noted in HPI. Temp:  [98.3 F (36.8 C)] 98.3 F (36.8 C) (02/25 1301) Pulse Rate:  [70] 70 (02/25 1302) Resp:  [13] 13 (02/25 1302) BP: (143)/(93) 143/93 (02/25 1302) SpO2:  [94 %] 94 % (02/25 1302) Weight:  [103.4 kg] 103.4 kg (02/25 1310) General appearance: alert and cooperative Resp: no Resp Distress Cardio: regular Extremities: swelling and deformity of right wrist   Assessment: R distal radius fracture Plan: Will attempt closed reduction, may require ORIF I have discussed this treatment plan in detail with patient and family, including the risks of the recommended treatment and surgery, the benefits and the alternatives.  The patient understands that additional treatment may be necessary.  Charlton Boule C Mikaelyn Arthurs 01/06/2019, 3:04 PM

## 2019-01-07 ENCOUNTER — Encounter (HOSPITAL_COMMUNITY): Payer: Self-pay | Admitting: General Surgery

## 2019-01-07 NOTE — Op Note (Signed)
NAME: Cody Frazier, Cody Frazier MEDICAL RECORD ZS:[id number] ACCOUNT [id number] DATE OF BIRTH:1945/12/27 FACILITY: [facility] LOCATION: [place], [place]  OPERATIVE REPORT  DATE OF PROCEDURE:  01/06/2019  PREOPERATIVE DIAGNOSIS:  Closed displaced fracture to the right distal radius and ulnar styloid.  POSTOPERATIVE DIAGNOSIS:  Closed displaced fracture to the right distal radius and ulnar styloid.  PROCEDURES:  1.  Closed reduction with manipulation of the right distal radius. 2.  Percutaneous K-wire fixation of the right distal radius fracture under fluoroscopy.  INDICATIONS:  The patient is a 73 year old gentleman who fell last week, sustaining a distal radius fracture.  The fracture was displaced and angulated.  Risks, benefits and alternatives of surgical fixation were discussed with the patient with note the  patient has a significant medical history and required preoperative clearance for surgery.  After this was complete.  Consent was obtained for closed versus open reduction internal fixation of the right wrist fracture.  DESCRIPTION OF PROCEDURE:  The patient was taken to the operating room and placed supine on the operating room table.  Prior to the operating room, Anesthesia had administered a block to the right upper extremity.  After timeout was performed and slight  anesthesia sedation,  x-ray was brought into view.  The wrist was manipulated and reduced under fluoroscopy.  Images revealed very nice reduction on multiple views.  The decision was then made to secure the reduction with K-wires instead of open  reduction and internal fixation because of the patient's medical status and his anticoagulation requirements.  The wrist was prepped and draped in normal sterile fashion.  Anatomic markings were placed on the wrist for K-wire placement.  Then 0.062 K  wires were driven in a retrograde fashion from the radial styloid across the fracture site into the ulna.  A total  of 2 K-wires were used for this nicely holding the fracture reduced.  A third K-wire was driven from the dorsal ulnar corner of the radius  proximally and volarly to further provide a 3-dimensional type fixation.  Final x-rays revealed satisfactory pin placement and good reduction.  The pins were cut to just exiting the skin.  Sterile dressing and volar and dorsal splint were placed.    The patient was awakened and tolerated the procedure well.  AN/NUANCE  D:01/06/2019 T:01/07/2019 JOB:[id number]

## 2019-01-13 ENCOUNTER — Ambulatory Visit (INDEPENDENT_AMBULATORY_CARE_PROVIDER_SITE_OTHER): Payer: Medicare Other | Admitting: Cardiovascular Disease

## 2019-01-13 ENCOUNTER — Encounter: Payer: Self-pay | Admitting: Cardiovascular Disease

## 2019-01-13 VITALS — BP 108/73 | HR 84 | Ht 66.0 in | Wt 225.0 lb

## 2019-01-13 DIAGNOSIS — I272 Pulmonary hypertension, unspecified: Secondary | ICD-10-CM

## 2019-01-13 DIAGNOSIS — I119 Hypertensive heart disease without heart failure: Secondary | ICD-10-CM

## 2019-01-13 DIAGNOSIS — I480 Paroxysmal atrial fibrillation: Secondary | ICD-10-CM

## 2019-01-13 DIAGNOSIS — H401131 Primary open-angle glaucoma, bilateral, mild stage: Secondary | ICD-10-CM | POA: Diagnosis not present

## 2019-01-13 DIAGNOSIS — R0683 Snoring: Secondary | ICD-10-CM | POA: Diagnosis not present

## 2019-01-13 DIAGNOSIS — I700 Atherosclerosis of aorta: Secondary | ICD-10-CM

## 2019-01-13 DIAGNOSIS — Z7901 Long term (current) use of anticoagulants: Secondary | ICD-10-CM | POA: Diagnosis not present
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
## 2019-01-13 NOTE — Patient Instructions (Signed)
Medication Instructions:  Your physician recommends that you continue on your current medications as directed. Please refer to the Current Medication list given to you today.  If you need a refill on your cardiac medications before your next appointment, please call your pharmacy.    Testing/Procedures: Your physician has recommended that you have a HOME sleep study IN 1 MONTH. This test records several body functions during sleep, including: brain activity, eye movement, oxygen and carbon dioxide blood levels, heart rate and rhythm, breathing rate and rhythm, the flow of air through your mouth and nose, snoring, body muscle movements, and chest and belly movement.  Follow-Up: At [facility], you and your health needs are our priority.  As part of our continuing mission to provide you with exceptional heart care, we have created designated Provider Care Teams.  These Care Teams include your primary Cardiologist (physician) and Advanced Practice Providers (APPs -  Physician Assistants and Nurse Practitioners) who all work together to provide you with the care you need, when you need it. You will need a follow up appointment in 1 years.  Please call our office 2 months in advance to schedule this appointment.  You may see Dr. Sallyanne Kuster or one of the following Advanced Practice Providers on your designated Care Team: Almyra Deforest, [place] . Fabian Sharp, PA-C  Any Other Special Instructions Will Be Listed Below (If Applicable). None

## 2019-01-14 DIAGNOSIS — S52531A Colles' fracture of right radius, initial encounter for closed fracture: Secondary | ICD-10-CM | POA: Diagnosis not present

## 2019-01-17 ENCOUNTER — Other Ambulatory Visit: Payer: Self-pay | Admitting: Cardiovascular Disease

## 2019-01-20 NOTE — Telephone Encounter (Signed)
Rx(s) sent to pharmacy electronically.  

## 2019-01-26 ENCOUNTER — Ambulatory Visit: Payer: Medicare Other | Admitting: Cardiology

## 2019-01-26 DIAGNOSIS — S52531D Colles' fracture of right radius, subsequent encounter for closed fracture with routine healing: Secondary | ICD-10-CM | POA: Diagnosis not present

## 2019-01-28 ENCOUNTER — Other Ambulatory Visit: Payer: Self-pay

## 2019-01-28 ENCOUNTER — Ambulatory Visit (HOSPITAL_BASED_OUTPATIENT_CLINIC_OR_DEPARTMENT_OTHER): Payer: Medicare Other | Attending: Cardiovascular Disease | Admitting: Cardiovascular Disease

## 2019-01-28 DIAGNOSIS — R0902 Hypoxemia: Secondary | ICD-10-CM | POA: Diagnosis not present

## 2019-01-28 DIAGNOSIS — I700 Atherosclerosis of aorta: Secondary | ICD-10-CM | POA: Insufficient documentation

## 2019-01-28 DIAGNOSIS — G4733 Obstructive sleep apnea (adult) (pediatric): Secondary | ICD-10-CM | POA: Diagnosis not present

## 2019-01-28 DIAGNOSIS — Z79899 Other long term (current) drug therapy: Secondary | ICD-10-CM | POA: Diagnosis not present

## 2019-01-28 DIAGNOSIS — R0683 Snoring: Secondary | ICD-10-CM

## 2019-01-28 DIAGNOSIS — I480 Paroxysmal atrial fibrillation: Secondary | ICD-10-CM

## 2019-01-28 DIAGNOSIS — I272 Pulmonary hypertension, unspecified: Secondary | ICD-10-CM | POA: Diagnosis not present

## 2019-01-28 DIAGNOSIS — I10 Essential (primary) hypertension: Secondary | ICD-10-CM | POA: Insufficient documentation

## 2019-01-28 DIAGNOSIS — I119 Hypertensive heart disease without heart failure: Secondary | ICD-10-CM

## 2019-01-28 DIAGNOSIS — Z7901 Long term (current) use of anticoagulants: Secondary | ICD-10-CM | POA: Diagnosis not present

## 2019-02-05 ENCOUNTER — Encounter (HOSPITAL_BASED_OUTPATIENT_CLINIC_OR_DEPARTMENT_OTHER): Payer: Self-pay | Admitting: Cardiovascular Disease

## 2019-02-05 ENCOUNTER — Telehealth: Payer: Self-pay | Admitting: Internal Medicine

## 2019-02-05 NOTE — Telephone Encounter (Signed)
Would like to know if Dr. Alain Marion could give him a call with sleep study results.

## 2019-02-05 NOTE — Procedures (Signed)
      Patient Name: Cody Frazier, Vasudevan Date: 01/28/2019 Gender: Male D.O.B: 07-18-1946 Age (years): 42 Referring Provider: Dani Gobble Croitoru Height (inches): 65 Interpreting Physician: Shelva Majestic MD, ABSM Weight (lbs): 225 RPSGT: Jacolyn Reedy BMI: 37 MRN: [id number] Neck Size: 16.50  CLINICAL INFORMATION Sleep Study Type: HST  Indication for sleep study: Snoring (786.09)  Epworth Sleepiness Score: 17  SLEEP STUDY TECHNIQUE A multi-channel overnight portable sleep study was performed. The channels recorded were: nasal airflow, thoracic respiratory movement, and oxygen saturation with a pulse oximetry. Snoring was also monitored.  MEDICATIONS     acetaminophen (TYLENOL) 500 MG tablet         apixaban (ELIQUIS) 2.5 MG TABS tablet         atorvastatin (LIPITOR) 20 MG tablet         cetirizine (ZYRTEC) 10 MG tablet         diltiazem (CARDIZEM CD) 240 MG 24 hr capsule         fluticasone (FLONASE) 50 MCG/ACT nasal spray         latanoprost (XALATAN) 0.005 % ophthalmic solution         metoprolol succinate (TOPROL XL) 25 MG 24 hr tablet         Multiple Vitamins-Minerals (CENTRUM SILVER PO)         pantoprazole (PROTONIX) 40 MG tablet         polyethylene glycol (MIRALAX / GLYCOLAX) packet         telmisartan (MICARDIS) 80 MG tablet         triamcinolone cream (KENALOG) 0.5 %      Patient self administered medications include: N/A.  SLEEP ARCHITECTURE Patient was studied for 643.7 minutes. The sleep efficiency was 100.0 % and the patient was supine for 89%. The arousal index was 0.0 per hour.  RESPIRATORY PARAMETERS The overall AHI was 50.9 per hour, with a central apnea index of 0.0 per hour.  The oxygen nadir was 75% during sleep.  CARDIAC DATA Mean heart rate during sleep was 76.3 bpm.  IMPRESSIONS - Severe obstructive sleep apnea occurred during this study (AHI 50.9/h); events were worse with supine sleep (AHI 53.9/h) versus non-supine sleep (AHI  26.35/h). - No significant central sleep apnea occurred during this study (CAI = 0.0/h). - Severe oxygen desaturation to a nadir of 75%. Time spent under 89% was 83 minutes. - Patient snored 12.4% during the sleep.  DIAGNOSIS - Obstructive Sleep Apnea (327.23 [G47.33 ICD-10]) - Nocturnal Hypoxemia (327.26 [G47.36 ICD-10]) - Excessive Daytime Sleepiness  RECOMMENDATIONS - In this patient with significant cardiovascular co-morbidities and severe sleep apnea recommend an in-lab CPAP titration study for further asssessment and treatment of his severe sleep disordered breathing. - Efforts shouod be made to optimize nasal and oropharyngeal -patency. - Positional therapy avoiding supine position during sleep. - Avoid alcohol, sedatives and other CNS depressants that may worsen sleep apnea and disrupt normal sleep architecture. - Sleep hygiene should be reviewed to assess factors that may improve sleep quality. - Weight management (BMI 37) and regular exercise should be initiated or continued. - Return to Sleep Center to discuss the results of this study   [Electronically signed] 02/05/2019 02:38 PM  Shelva Majestic MD, [facility], ABSM Diplomate, American Board of Sleep Medicine   NPI: [id number] [place] PH: [phone]   FX: [phone] [place]

## 2019-02-06 ENCOUNTER — Telehealth: Payer: Self-pay | Admitting: *Deleted

## 2019-02-06 ENCOUNTER — Other Ambulatory Visit: Payer: Self-pay | Admitting: Cardiovascular Disease

## 2019-02-06 DIAGNOSIS — G4733 Obstructive sleep apnea (adult) (pediatric): Secondary | ICD-10-CM

## 2019-02-06 DIAGNOSIS — G4736 Sleep related hypoventilation in conditions classified elsewhere: Secondary | ICD-10-CM

## 2019-02-06 DIAGNOSIS — IMO0002 Reserved for concepts with insufficient information to code with codable children: Secondary | ICD-10-CM

## 2019-02-06 DIAGNOSIS — G4719 Other hypersomnia: Secondary | ICD-10-CM

## 2019-02-06 NOTE — Telephone Encounter (Signed)
Cardiology called and spoke with patient about results

## 2019-02-06 NOTE — Telephone Encounter (Signed)
-----   Message from Troy Sine, MD sent at 02/05/2019  2:49 PM EDT ----- Mariann Laster, please notify pt of results and will need CPAP titration;

## 2019-02-06 NOTE — Telephone Encounter (Signed)
Both patient and wife notified via speaker phone of HST results and recommendations. CPAP titration appointment scheduled for 03/23/19.

## 2019-02-09 DIAGNOSIS — S52531D Colles' fracture of right radius, subsequent encounter for closed fracture with routine healing: Secondary | ICD-10-CM | POA: Diagnosis not present

## 2019-02-10 ENCOUNTER — Other Ambulatory Visit: Payer: Self-pay | Admitting: Internal Medicine

## 2019-02-21 ENCOUNTER — Other Ambulatory Visit: Payer: Self-pay | Admitting: Internal Medicine

## 2019-02-24 ENCOUNTER — Encounter: Payer: Self-pay | Admitting: Internal Medicine

## 2019-02-24 ENCOUNTER — Ambulatory Visit (INDEPENDENT_AMBULATORY_CARE_PROVIDER_SITE_OTHER): Payer: Medicare Other | Admitting: Internal Medicine

## 2019-02-24 DIAGNOSIS — R21 Rash and other nonspecific skin eruption: Secondary | ICD-10-CM

## 2019-02-24 DIAGNOSIS — M519 Unspecified thoracic, thoracolumbar and lumbosacral intervertebral disc disorder: Secondary | ICD-10-CM | POA: Diagnosis not present

## 2019-02-24 DIAGNOSIS — S62101S Fracture of unspecified carpal bone, right wrist, sequela: Secondary | ICD-10-CM

## 2019-02-24 DIAGNOSIS — K922 Gastrointestinal hemorrhage, unspecified: Secondary | ICD-10-CM

## 2019-02-24 DIAGNOSIS — S62109A Fracture of unspecified carpal bone, unspecified wrist, initial encounter for closed fracture: Secondary | ICD-10-CM | POA: Insufficient documentation

## 2019-02-24 MED ORDER — OXYCODONE HCL 15 MG PO TABS: 15.0000 mg | ORAL_TABLET | Freq: Four times a day (QID) | ORAL | 0 refills | Status: DC | PRN

## 2019-02-24 MED ORDER — TRIAMCINOLONE ACETONIDE 0.5 % EX CREA: 1.0000 "application " | TOPICAL_CREAM | Freq: Three times a day (TID) | CUTANEOUS | 1 refills | Status: DC

## 2019-02-24 MED ORDER — OXYCODONE HCL 15 MG PO TABS: 15.0000 mg | ORAL_TABLET | Freq: Four times a day (QID) | ORAL | 0 refills | Status: DC

## 2019-02-24 NOTE — Assessment & Plan Note (Signed)
Oxycodone   Potential benefits of a long term opioids use as well as potential risks (i.e. addiction risk, apnea etc) and complications (i.e. Somnolence, constipation and others) were explained to the patient and were aknowledged. 

## 2019-02-24 NOTE — Assessment & Plan Note (Signed)
status post distal radius right wrist fracture in February 2020, status post closed reduction and percutaneous K wire fixation of the right distal radius fracture under fluoroscopy on 01/06/19

## 2019-02-24 NOTE — Assessment & Plan Note (Signed)
Dermatitis: triamcinolone cream helped in the past.  Will email a prescription

## 2019-02-24 NOTE — Progress Notes (Signed)
Virtual Visit via Telephone Note  I connected with Cody Frazier on 02/24/19 at  2:00 PM EDT by telephone and verified that I am speaking with the correct person using two identifiers.   I discussed the limitations, risks, security and privacy concerns of performing an evaluation and management service by telephone and the availability of in person appointments. I also discussed with the patient that there may be a patient responsible charge related to this service. The patient expressed understanding and agreed to proceed.   History of Present Illness:   Follow-up on chronic low back pain, rash on the chest, status post distal radius right wrist fracture in February, status post closed reduction and percutaneous K wire fixation of the right distal radius fracture under fluoroscopy on 01/06/19 Observations/Objective:  The patient is in no acute distress.  He looks well.  His right wrist is in a splint Assessment and Plan: See plan COVID-19 prevention discussed  Follow Up Instructions:    I discussed the assessment and treatment plan with the patient. The patient was provided an opportunity to ask questions and all were answered. The patient agreed with the plan and demonstrated an understanding of the instructions.   The patient was advised to call back or seek an in-person evaluation if the symptoms worsen or if the condition fails to improve as anticipated.  I provided 20 minutes of non-face-to-face time during this encounter.   Walker Kehr, MD

## 2019-02-24 NOTE — Assessment & Plan Note (Signed)
No relapse 

## 2019-03-21 ENCOUNTER — Other Ambulatory Visit: Payer: Self-pay | Admitting: Internal Medicine

## 2019-03-23 ENCOUNTER — Encounter (HOSPITAL_BASED_OUTPATIENT_CLINIC_OR_DEPARTMENT_OTHER): Payer: Medicare Other

## 2019-04-24 ENCOUNTER — Other Ambulatory Visit: Payer: Self-pay | Admitting: Cardiology

## 2019-04-24 NOTE — Telephone Encounter (Signed)
Please refill as patient saw Dr Claiborne Billings on 02/06/2019

## 2019-04-27 ENCOUNTER — Other Ambulatory Visit (HOSPITAL_COMMUNITY)
Admission: RE | Admit: 2019-04-27 | Discharge: 2019-04-27 | Disposition: A | Discharge status: Home / Self Care | Payer: Medicare Other | Source: Ambulatory Visit | Attending: Cardiovascular Disease | Admitting: Cardiovascular Disease

## 2019-04-27 DIAGNOSIS — Z1159 Encounter for screening for other viral diseases: Secondary | ICD-10-CM | POA: Insufficient documentation

## 2019-04-28 LAB — NOVEL CORONAVIRUS, NAA (HOSP ORDER, SEND-OUT TO REF LAB; TAT 18-24 HRS): SARS-CoV-2, NAA: NOT DETECTED

## 2019-04-30 ENCOUNTER — Other Ambulatory Visit: Payer: Self-pay

## 2019-04-30 ENCOUNTER — Ambulatory Visit (HOSPITAL_BASED_OUTPATIENT_CLINIC_OR_DEPARTMENT_OTHER): Payer: Medicare Other | Attending: Cardiovascular Disease | Admitting: Cardiovascular Disease

## 2019-04-30 DIAGNOSIS — G4733 Obstructive sleep apnea (adult) (pediatric): Secondary | ICD-10-CM | POA: Insufficient documentation

## 2019-04-30 DIAGNOSIS — G4736 Sleep related hypoventilation in conditions classified elsewhere: Secondary | ICD-10-CM | POA: Insufficient documentation

## 2019-04-30 DIAGNOSIS — IMO0002 Reserved for concepts with insufficient information to code with codable children: Secondary | ICD-10-CM

## 2019-04-30 DIAGNOSIS — G4719 Other hypersomnia: Secondary | ICD-10-CM | POA: Diagnosis not present

## 2019-05-07 ENCOUNTER — Encounter (HOSPITAL_BASED_OUTPATIENT_CLINIC_OR_DEPARTMENT_OTHER): Payer: Self-pay | Admitting: Cardiovascular Disease

## 2019-05-07 NOTE — Procedures (Signed)
Patient Name: Cody Frazier, [place] Date: 04/30/2019 Gender: Male D.O.B: Mar 08, 1946 Age (years): 61 Referring Provider: Dani Gobble Croitoru Height (inches): 66 Interpreting Physician: Shelva Majestic MD, ABSM Weight (lbs): 220 RPSGT: Baxter Flattery BMI: 36 MRN: [id number] Neck Size: 19.50  CLINICAL INFORMATION The patient is referred for a CPAP titration to treat sleep apnea.  Date of HST: 01/28/2019: AHI 50.9/h; O2 nadir 75%.  SLEEP STUDY TECHNIQUE As per the AASM Manual for the Scoring of Sleep and Associated Events v2.3 (April 2016) with a hypopnea requiring 4% desaturations.  The channels recorded and monitored were frontal, central and occipital EEG, electrooculogram (EOG), submentalis EMG (chin), nasal and oral airflow, thoracic and abdominal wall motion, anterior tibialis EMG, snore microphone, electrocardiogram, and pulse oximetry. Continuous positive airway pressure (CPAP) was initiated at the beginning of the study and titrated to treat sleep-disordered breathing.  MEDICATIONS     acetaminophen (TYLENOL) 500 MG tablet         atorvastatin (LIPITOR) 20 MG tablet         cetirizine (ZYRTEC) 10 MG tablet         diltiazem (CARDIZEM CD) 240 MG 24 hr capsule         ELIQUIS 2.5 MG TABS tablet         fluticasone (FLONASE) 50 MCG/ACT nasal spray         latanoprost (XALATAN) 0.005 % ophthalmic solution         metoprolol succinate (TOPROL-XL) 25 MG 24 hr tablet         Multiple Vitamins-Minerals (CENTRUM SILVER PO)         oxyCODONE (ROXICODONE) 15 MG immediate release tablet         oxyCODONE (ROXICODONE) 15 MG immediate release tablet         oxyCODONE (ROXICODONE) 15 MG immediate release tablet         pantoprazole (PROTONIX) 40 MG tablet         polyethylene glycol (MIRALAX / GLYCOLAX) packet         telmisartan (MICARDIS) 80 MG tablet         triamcinolone cream (KENALOG) 0.5 %      Medications self-administered by patient taken the night of the study : PANTOPRAZOLE  SODIUM, ELIQUIS, TELMISARTAN, CETIRIZINE, LATANOPROST, TIMOLOL, FLUTICASONE  TECHNICIAN COMMENTS Comments added by technician: SIMPLUS FULL FACE MASK WAS USED FOR THIS STUDY Comments added by scorer: N/A  RESPIRATORY PARAMETERS Optimal PAP Pressure (cm): 13 AHI at Optimal Pressure (/hr): 0.0 Overall Minimal O2 (%): 76.0 Supine % at Optimal Pressure (%): 100 Minimal O2 at Optimal Pressure (%): 91.0   SLEEP ARCHITECTURE The study was initiated at 11:26:02 PM and ended at 5:25:19 AM.  Sleep onset time was 8.0 minutes and the sleep efficiency was 95.8%%. The total sleep time was 344.3 minutes.  The patient spent 0.1%% of the night in stage N1 sleep, 60.4%% in stage N2 sleep, 18.2%% in stage N3 and 21.4% in REM.Stage REM latency was 29.5 minutes  Wake after sleep onset was 7.0. Alpha intrusion was absent. Supine sleep was 98.84%.  CARDIAC DATA The 2 lead EKG demonstrated sinus rhythm. The mean heart rate was 64.9 beats per minute. Other EKG findings include: PVCs.  LEG MOVEMENT DATA The total Periodic Limb Movements of Sleep (PLMS) were 0. The PLMS index was 0.0. A PLMS index of <15 is considered normal in adults.  IMPRESSIONS - CPAP was initiated at 5 cm and was titrated to optimal PAP pressure of13 cm of  water. AHI at 13 cm was 0; O2 nadir 91%. - Mild Central Sleep Apnea was noted during this titration (CAI = 5.9/h). - Severe oxygen desaturations were observed during this titration (min O2 = 76.0%). - No snoring was audible during this study. - 2-lead EKG demonstrated: PVCs - Clinically significant periodic limb movements were not noted during this study. Arousals associated with PLMs were rare.  DIAGNOSIS - Obstructive Sleep Apnea (327.23 [G47.33 ICD-10])  RECOMMENDATIONS - Recommend an initial trial of CPAP therapy with EPR at 13 cm H2O with heated humidification. A Large size Fisher&Paykel Full Face Mask Simplus mask was used for the titration study. - Efforts should be made to  optimize nasal and oropharyngeal patency. - Avoid alcohol, sedatives and other CNS depressants that may worsen sleep apnea and disrupt normal sleep architecture. - Sleep hygiene should be reviewed to assess factors that may improve sleep quality. - Weight management and regular exercise should be initiated or continued. - Recommend a download in 30 days and sleep clinic evaluation after 4 weeks of therapy.   [Electronically signed] 05/07/2019 11:43 AM  Shelva Majestic MD, [facility], [place], American Board of Sleep Medicine   NPI: [id number]  [place] PH: [phone]   FX: [phone] [place]

## 2019-05-08 ENCOUNTER — Other Ambulatory Visit: Payer: Self-pay | Admitting: Internal Medicine

## 2019-05-08 ENCOUNTER — Telehealth: Payer: Self-pay | Admitting: *Deleted

## 2019-05-08 NOTE — Telephone Encounter (Signed)
-----   Message from Troy Sine, MD sent at 05/07/2019 11:49 AM EDT ----- Mariann Laster please notify pt and set up with DME for CPAP initiation.

## 2019-05-08 NOTE — Telephone Encounter (Signed)
Patient notified sleep study has been completed. Orders for CPAP machine will be submitted to [facility] next week. They will call him for set up appointment.

## 2019-05-27 ENCOUNTER — Ambulatory Visit (INDEPENDENT_AMBULATORY_CARE_PROVIDER_SITE_OTHER): Payer: Medicare Other | Admitting: Internal Medicine

## 2019-05-27 ENCOUNTER — Other Ambulatory Visit: Payer: Self-pay

## 2019-05-27 ENCOUNTER — Encounter: Payer: Self-pay | Admitting: Internal Medicine

## 2019-05-27 DIAGNOSIS — Z6835 Body mass index (BMI) 35.0-35.9, adult: Secondary | ICD-10-CM | POA: Diagnosis not present

## 2019-05-27 DIAGNOSIS — M519 Unspecified thoracic, thoracolumbar and lumbosacral intervertebral disc disorder: Secondary | ICD-10-CM

## 2019-05-27 DIAGNOSIS — Z9989 Dependence on other enabling machines and devices: Secondary | ICD-10-CM | POA: Diagnosis not present

## 2019-05-27 DIAGNOSIS — F411 Generalized anxiety disorder: Secondary | ICD-10-CM

## 2019-05-27 DIAGNOSIS — I700 Atherosclerosis of aorta: Secondary | ICD-10-CM | POA: Diagnosis not present

## 2019-05-27 DIAGNOSIS — G4733 Obstructive sleep apnea (adult) (pediatric): Secondary | ICD-10-CM | POA: Diagnosis not present

## 2019-05-27 MED ORDER — OXYCODONE HCL 15 MG PO TABS: 15.0000 mg | ORAL_TABLET | Freq: Four times a day (QID) | ORAL | 0 refills | Status: DC

## 2019-05-27 MED ORDER — OXYCODONE HCL 15 MG PO TABS: 15.0000 mg | ORAL_TABLET | Freq: Four times a day (QID) | ORAL | 0 refills | Status: DC | PRN

## 2019-05-27 NOTE — Patient Instructions (Signed)
  These suggestions will probably help you to lose weight: 1.  Reduce your consumption of sugars and starches.  Eliminate high fructose corn syrup from your diet.  Reduce your consumption of processed foods.  For desserts try to have seasonal fruits, berries with with green, knots, cheeses or dark chocolate with more than 70% cacao. 2.  Do not snack 3.  You do not have to eat breakfast.  If you choose to have breakfast-eat plain greek yogurt, eggs, oatmeal (without sugar) 4.  Drink water, freshly brewed unsweetened tea (green, black or herbal) or coffee.  Do not drink sodas including diet sodas , juices, beverages sweetened with artificial sweeteners. 5.  Reduce your consumption of refined grains. 6.  Avoid protein drinks such as Optifast, Slim fast etc. Eat chicken, fish, meat, dairy and beans for your sources of protein 7.  Natural unprocessed fats like cold pressed virgin olive oil, butter, coconut oil are good for you.  Eat avocados 8.  Increase your consumption of fiber.  Fruits, berries, vegetables, whole grains, flaxseeds, Chia seeds, beans, popcorn, nuts, oatmeal are good sources of fiber 9.  Use vinegar in your diet, i.e. apple cider vinegar, red wine or balsamic vinegar 10.  You can try fasting.  For example you can skip breakfast and lunch every other day (24-hour fast)    Mediterranean diet is good for you.  If you drink alcohol, limit your alcohol intake to no more than 2 drinks a day. The Mediterranean diet is a way of eating based on the traditional cuisine of countries bordering the [employer]. While there is no single definition of the Mediterranean diet, it is typically high in vegetables, fruits, whole grains, beans, nut and seeds, and olive oil. The main components of Mediterranean diet include: Marland Kitchen Daily consumption of vegetables, fruits, whole grains and healthy fats  . Weekly intake of fish, poultry, beans and eggs  . Moderate portions of dairy products  . Limited  intake of red meat Other important elements of the Mediterranean diet are sharing meals with family and friends, enjoying a glass of red wine and being physically active.  Cabbage soup recipe for losing weight: Take 1 small head of CABG, 1 average pack of celery, 4 green peppers, 4 onions, 2 cans diced tomatoes, salt and spices to taste.  Chop cabbage, celery, peppers and onions.  And tomatoes and 2-2.5 liters (2.5 quarts) of water so that it would just cover the vegetables.  Bring to boil.  Add spices and salt.  Turn heat to low/medium and simmer for 20-25 minutes.

## 2019-05-27 NOTE — Assessment & Plan Note (Signed)
Chronic Oxycodone   Potential benefits of a long term opioids use as well as potential risks (i.e. addiction risk, apnea etc) and complications (i.e. Somnolence, constipation and others) were explained to the patient and were aknowledged

## 2019-05-27 NOTE — Assessment & Plan Note (Signed)
On Buspar 

## 2019-05-27 NOTE — Assessment & Plan Note (Signed)
Lipitor 

## 2019-05-27 NOTE — Assessment & Plan Note (Signed)
CPAP.  

## 2019-05-27 NOTE — Progress Notes (Signed)
Subjective:  Patient ID: Cody Frazier, male    DOB: [birth year]  Age: 73 y.o. MRN: [id number]  CC: No chief complaint on file.   HPI ZACCHEUS EDMISTER presents for chronic pain, HTN, CAD f/u  Outpatient Medications Prior to Visit  Medication Sig Dispense Refill   acetaminophen (TYLENOL) 500 MG tablet Take 1,000 mg by mouth every 6 (six) hours as needed for mild pain or moderate pain.     atorvastatin (LIPITOR) 20 MG tablet TAKE 1 TABLET BY MOUTH EVERY DAY (Patient taking differently: Take 20 mg by mouth every morning. ) 90 tablet 3   cetirizine (ZYRTEC) 10 MG tablet Take 10 mg by mouth daily.     diltiazem (CARDIZEM CD) 240 MG 24 hr capsule Take 1 capsule (240 mg total) by mouth daily. 90 capsule 3   ELIQUIS 2.5 MG TABS tablet TAKE 1 TABLET BY MOUTH TWICE A DAY 60 tablet 11   fluticasone (FLONASE) 50 MCG/ACT nasal spray SPRAY 2 SPRAYS INTO EACH NOSTRIL EVERY DAY 48 mL 1   latanoprost (XALATAN) 0.005 % ophthalmic solution Place 1 drop into both eyes at bedtime.  11   metoprolol succinate (TOPROL-XL) 25 MG 24 hr tablet TAKE 1 TABLET BY MOUTH EVERY DAY 90 tablet 2   Multiple Vitamins-Minerals (CENTRUM SILVER PO) Take by mouth daily.       oxyCODONE (ROXICODONE) 15 MG immediate release tablet Take 1 tablet (15 mg total) by mouth 4 (four) times daily as needed for pain. Fill on or after 02/25/19 120 tablet 0   oxyCODONE (ROXICODONE) 15 MG immediate release tablet Take 1 tablet (15 mg total) by mouth 4 (four) times daily. 120 tablet 0   oxyCODONE (ROXICODONE) 15 MG immediate release tablet Take 1 tablet (15 mg total) by mouth 4 (four) times daily. 120 tablet 0   pantoprazole (PROTONIX) 40 MG tablet Take 1 tablet (40 mg total) by mouth 2 (two) times daily. 60 tablet 11   polyethylene glycol (MIRALAX / GLYCOLAX) packet Take 17 g by mouth daily. (Patient taking differently: Take 17 g by mouth daily as needed for mild constipation. ) 14 each 0   telmisartan (MICARDIS) 80 MG tablet  TAKE 1 TABLET BY MOUTH EVERY DAY 90 tablet 2   triamcinolone cream (KENALOG) 0.5 % Apply 1 application topically 3 (three) times daily. 90 g 1   No facility-administered medications prior to visit.     ROS: Review of Systems  Constitutional: Positive for unexpected weight change. Negative for appetite change and fatigue.  HENT: Negative for congestion, nosebleeds, sneezing, sore throat and trouble swallowing.   Eyes: Negative for itching and visual disturbance.  Respiratory: Negative for cough.   Cardiovascular: Negative for chest pain, palpitations and leg swelling.  Gastrointestinal: Negative for abdominal distention, blood in stool, diarrhea and nausea.  Genitourinary: Negative for frequency and hematuria.  Musculoskeletal: Positive for arthralgias, back pain and gait problem. Negative for joint swelling and neck pain.  Skin: Negative for rash.  Neurological: Negative for dizziness, tremors, speech difficulty and weakness.  Psychiatric/Behavioral: Negative for agitation, dysphoric mood and sleep disturbance. The patient is not nervous/anxious.     Objective:  BP 136/84 (BP Location: Left Arm, Patient Position: Sitting, Cuff Size: Large)    Pulse 69    Temp 98.7 F (37.1 C) (Oral)    Ht 5\' 6"  (1.676 m)    Wt 225 lb (102.1 kg)    SpO2 98%    BMI 36.32 kg/m   BP Readings from Last  3 Encounters:  05/27/19 136/84  01/13/19 108/73  01/06/19 (!) 147/89    Wt Readings from Last 3 Encounters:  05/27/19 225 lb (102.1 kg)  04/30/19 220 lb (99.8 kg)  01/13/19 225 lb (102.1 kg)    Physical Exam Constitutional:      General: He is not in acute distress.    Appearance: He is well-developed.     Comments: NAD  Eyes:     Conjunctiva/sclera: Conjunctivae normal.     Pupils: Pupils are equal, round, and reactive to light.  Neck:     Musculoskeletal: Normal range of motion.     Thyroid: No thyromegaly.     Vascular: No JVD.  Cardiovascular:     Rate and Rhythm: Normal rate and  regular rhythm.     Heart sounds: Normal heart sounds. No murmur. No friction rub. No gallop.   Pulmonary:     Effort: Pulmonary effort is normal. No respiratory distress.     Breath sounds: Normal breath sounds. No wheezing or rales.  Chest:     Chest wall: No tenderness.  Abdominal:     General: Bowel sounds are normal. There is no distension.     Palpations: Abdomen is soft. There is no mass.     Tenderness: There is no abdominal tenderness. There is no guarding or rebound.  Musculoskeletal: Normal range of motion.        General: Tenderness present.  Lymphadenopathy:     Cervical: No cervical adenopathy.  Skin:    General: Skin is warm and dry.     Findings: No rash.  Neurological:     Mental Status: He is alert and oriented to person, place, and time.     Cranial Nerves: No cranial nerve deficit.     Motor: No abnormal muscle tone.     Coordination: Coordination normal.     Gait: Gait normal.     Deep Tendon Reflexes: Reflexes are normal and symmetric.  Psychiatric:        Behavior: Behavior normal.        Thought Content: Thought content normal.        Judgment: Judgment normal.   tender L wrist LS w/pain  Lab Results  Component Value Date   WBC 5.8 12/26/2018   HGB 13.3 12/26/2018   HCT 41.9 12/26/2018   PLT 307 12/26/2018   GLUCOSE 124 (H) 12/26/2018   CHOL 196 11/20/2018   TRIG 269.0 (H) 11/20/2018   HDL 43.60 11/20/2018   LDLDIRECT 109.0 11/20/2018   LDLCALC 86 11/25/2017   ALT 13 11/20/2018   AST 13 11/20/2018   NA 138 12/26/2018   K 4.3 12/26/2018   CL 104 12/26/2018   CREATININE 1.07 12/26/2018   BUN 15 12/26/2018   CO2 22 12/26/2018   TSH 1.22 11/20/2018   PSA 0.16 11/20/2018   INR 1.1 01/06/2019   HGBA1C 5.7 11/13/2016    No results found.  Assessment & Plan:   There are no diagnoses linked to this encounter.   No orders of the defined types were placed in this encounter.    Follow-up: No follow-ups on file.  Walker Kehr, MD

## 2019-05-27 NOTE — Assessment & Plan Note (Signed)
Wt Readings from Last 3 Encounters:  05/27/19 225 lb (102.1 kg)  04/30/19 220 lb (99.8 kg)  01/13/19 225 lb (102.1 kg)

## 2019-06-08 ENCOUNTER — Ambulatory Visit (INDEPENDENT_AMBULATORY_CARE_PROVIDER_SITE_OTHER): Payer: Medicare Other | Admitting: Physician Assistant

## 2019-06-08 ENCOUNTER — Other Ambulatory Visit: Payer: Self-pay

## 2019-06-08 ENCOUNTER — Telehealth: Payer: Self-pay | Admitting: Physician Assistant

## 2019-06-08 ENCOUNTER — Encounter: Payer: Self-pay | Admitting: Physician Assistant

## 2019-06-08 VITALS — BP 124/82 | HR 57 | Temp 96.6°F | Wt 231.8 lb

## 2019-06-08 DIAGNOSIS — E785 Hyperlipidemia, unspecified: Secondary | ICD-10-CM | POA: Diagnosis not present

## 2019-06-08 DIAGNOSIS — I480 Paroxysmal atrial fibrillation: Secondary | ICD-10-CM

## 2019-06-08 DIAGNOSIS — I10 Essential (primary) hypertension: Secondary | ICD-10-CM

## 2019-06-08 DIAGNOSIS — G4733 Obstructive sleep apnea (adult) (pediatric): Secondary | ICD-10-CM | POA: Diagnosis not present

## 2019-06-08 DIAGNOSIS — Z9989 Dependence on other enabling machines and devices: Secondary | ICD-10-CM | POA: Diagnosis not present

## 2019-06-08 NOTE — Patient Instructions (Addendum)
Medication Instructions:   ADD 1000 mg Fish Oil 2 times a day- Over the counter If you need a refill on your cardiac medications before your next appointment, please call your pharmacy.   Lab work: You will need to have labs (blood work) drawn in October 2020 the morning of your appointment with Dr. Claiborne Billings:  Fasting Lipid Panel-DO NOT eat or drink past midnight. Water is ok to drink   If you have labs (blood work) drawn today and your tests are completely normal, you will receive your results only by: Marland Kitchen MyChart Message (if you have MyChart) OR . A paper copy in the mail If you have any lab test that is abnormal or we need to change your treatment, we will call you to review the results.  Testing/Procedures: NONE ordered at this time of appointment   Follow-Up: At [facility], you and your health needs are our priority.  As part of our continuing mission to provide you with exceptional heart care, we have created designated Provider Care Teams.  These Care Teams include your primary Cardiologist (physician) and Advanced Practice Providers (APPs -  Physician Assistants and Nurse Practitioners) who all work together to provide you with the care you need, when you need it. . You will need a follow up appointment in 6 months.  Please call our office in November to schedule this appointment.  You may see Sanda Klein, MD  . Your physician recommends that you keep scheduled appointment with Troy Sine, MD on August 13, 2019  Any Other Special Instructions Will Be Listed Below (If Applicable).

## 2019-06-08 NOTE — Telephone Encounter (Signed)

## 2019-06-08 NOTE — Progress Notes (Signed)
Cardiology Office Note    Date:  06/08/2019   ID:  Frazier, Cody 22-Apr-1946, MRN [id number]  PCP:  Cassandria Anger, MD  Cardiologist:  Dr. Sallyanne Kuster  Chief Complaint  Patient presents with  . Follow-up    seen for Dr. Sallyanne Kuster.     History of Present Illness:  Cody Frazier is a 73 y.o. male with PMH of HTN, HLD with LDL goal <100, and PAF on eliquis.  Patient was previously followed by Dr. Wynonia Lawman.  Patient had a history of gastric outlet ulcer with hemorrhage in 2009, with resultant iron deficiency anemia.  He underwent a Billroth I procedure in 2011.  He had a new normal nuclear stress test in 2017.  According to the previous note, he has chronic 1-2+ ankle edema.  He also has prior history of daytime somnolence, a sleep study was ordered however he never completed it.  Most recent echocardiogram obtained on 01/01/2019 shows EF 60 to 65%, mild pulmonic regurgitation, mild to moderate dilatation of the ascending aorta measuring 39 mm.  He was most recently seen by Dr. Sallyanne Kuster on 01/13/2019, it was suspected he has some degree of pulmonary arterial hypertension related to obstructive sleep apnea.  Sleep study was performed in March 2020 consistent with obstructive sleep apnea.  Patient underwent trial of CPAP therapy in June.  Patient presents today for cardiology office visit.  On physical exam, his heart rate is quite regular.  He does not have any crackles in the lung.  There is no lower extremity edema.  So for some reason he has significant itchiness in the lower extremity and has been scratching a lot.  He is planning to discuss those with his primary care provider.  Last liver function in January 2020 was normal.  Otherwise, he has been compliant with CPAP therapy.  He is sleeping better now and noticed his blood pressure and heart rate are under better control after starting on the CPAP therapy.  Looking at his previous lab work in January, his triglycerides remain elevated.   I recommended over-the-counter fish oil 1000 mg twice daily.  He will need fasting lipid panel in 3 months and follow-up in 6 months.   Past Medical History:  Diagnosis Date  . Allergic rhinitis   . Anemia, iron deficiency   . Atrial arrhythmia   . Atrial fib/flutter, transient    following prior surgeries x 2  . Barrett's esophagus without dysplasia   . BPH (benign prostatic hyperplasia)   . Bright's disease   . Chronic LBP   . Closed fracture of right distal radius   . DDD (degenerative disc disease)   . Diverticulosis of colon   . Duodenal stricture   . Gastric outlet obstruction   . GERD (gastroesophageal reflux disease)   . Glaucoma   . Hemorrhoids   . History of kidney stones   . Hyperlipidemia   . Hyperplastic colonic polyp 01/2000  . Hypertension   . Nephrolithiasis    hx of B  . Peptic ulcer disease with hemorrhage 08/2008   and GOO H. Pylori Ab negative  . Renal cyst   . Tubular adenoma of colon 03/2012  . Wears glasses     Past Surgical History:  Procedure Laterality Date  . APPENDECTOMY    . BACK SURGERY  02/2003   L5  . BILROTH I PROCEDURE  2011   Dr Johney Maine  . CLOSED REDUCTION WRIST FRACTURE Right 01/06/2019   Procedure: Closed Reduction Wrist/Pinning  Wrist;  Surgeon: Dayna Barker, MD;  Location: [facility];  Service: Plastics;  Laterality: Right;  . COLONOSCOPY    . HIATAL HERNIA REPAIR    . LAPAROTOMY N/A 05/10/2017   Procedure: EXPLORATORY LAPAROTOMY WITH ENTEROLYSIS;  Surgeon: Johnathan Hausen, MD;  Location: [facility];  Service: General;  Laterality: N/A;  . LUMBAR FUSION  2009  . TONSILLECTOMY      Current Medications: Outpatient Medications Prior to Visit  Medication Sig Dispense Refill  . acetaminophen (TYLENOL) 500 MG tablet Take 1,000 mg by mouth every 6 (six) hours as needed for mild pain or moderate pain.    Marland Kitchen atorvastatin (LIPITOR) 20 MG tablet TAKE 1 TABLET BY MOUTH EVERY DAY (Patient taking differently: Take 20 mg by mouth every morning. ) 90  tablet 3  . cetirizine (ZYRTEC) 10 MG tablet Take 10 mg by mouth daily.    Marland Kitchen diltiazem (CARDIZEM CD) 240 MG 24 hr capsule Take 1 capsule (240 mg total) by mouth daily. 90 capsule 3  . ELIQUIS 2.5 MG TABS tablet TAKE 1 TABLET BY MOUTH TWICE A DAY 60 tablet 11  . fluticasone (FLONASE) 50 MCG/ACT nasal spray SPRAY 2 SPRAYS INTO EACH NOSTRIL EVERY DAY 48 mL 1  . latanoprost (XALATAN) 0.005 % ophthalmic solution Place 1 drop into both eyes at bedtime.  11  . metoprolol succinate (TOPROL-XL) 25 MG 24 hr tablet TAKE 1 TABLET BY MOUTH EVERY DAY 90 tablet 2  . Multiple Vitamins-Minerals (CENTRUM SILVER PO) Take by mouth daily.      Marland Kitchen oxyCODONE (ROXICODONE) 15 MG immediate release tablet Take 1 tablet (15 mg total) by mouth 4 (four) times daily as needed for pain. 120 tablet 0  . oxyCODONE (ROXICODONE) 15 MG immediate release tablet Take 1 tablet (15 mg total) by mouth 4 (four) times daily. 120 tablet 0  . oxyCODONE (ROXICODONE) 15 MG immediate release tablet Take 1 tablet (15 mg total) by mouth 4 (four) times daily. 120 tablet 0  . pantoprazole (PROTONIX) 40 MG tablet Take 1 tablet (40 mg total) by mouth 2 (two) times daily. 60 tablet 11  . polyethylene glycol (MIRALAX / GLYCOLAX) packet Take 17 g by mouth daily. (Patient taking differently: Take 17 g by mouth daily as needed for mild constipation. ) 14 each 0  . telmisartan (MICARDIS) 80 MG tablet TAKE 1 TABLET BY MOUTH EVERY DAY 90 tablet 2  . triamcinolone cream (KENALOG) 0.5 % Apply 1 application topically 3 (three) times daily. 90 g 1   No facility-administered medications prior to visit.      Allergies:   Codeine phosphate, Nsaids, and Adhesive [tape]   Social History   Socioeconomic History  . Marital status: Married    Spouse name: Cody Frazier  . Number of children: 1  . Years of education: Not on file  . Highest education level: Not on file  Occupational History  . Occupation: Mining engineer: [employer]  Social Needs   . Financial resource strain: Not hard at all  . Food insecurity    Worry: Never true    Inability: Never true  . Transportation needs    Medical: No    Non-medical: No  Tobacco Use  . Smoking status: Former Research scientist (life sciences)  . Smokeless tobacco: Never Used  . Tobacco comment: as a teenager  Substance and Sexual Activity  . Alcohol use: No  . Drug use: No  . Sexual activity: Yes  Lifestyle  . Physical activity    Days  per week: 0 days    Minutes per session: 0 min  . Stress: Not at all  Relationships  . Social connections    Talks on phone: More than three times a week    Gets together: More than three times a week    Attends religious service: More than 4 times per year    Active member of club or organization: Yes    Attends meetings of clubs or organizations: More than 4 times per year    Relationship status: Married  Other Topics Concern  . Not on file  Social History Narrative   Patient gets regular exercise   Married x 42 years     Family History:  The patient's family history includes Cancer in his cousin, father, mother, and paternal uncle; Colon cancer (age of onset: 35) in his paternal uncle and paternal uncle; Colon cancer (age of onset: 69) in his father; Colon cancer (age of onset: 64) in his mother; Colon polyps in his father and mother; Hypertension in an other family member.   ROS:   Please see the history of present illness.    ROS All other systems reviewed and are negative.   PHYSICAL EXAM:   VS:  BP 124/82   Pulse (!) 57   Temp (!) 96.6 F (35.9 C)   Wt 231 lb 12.8 oz (105.1 kg)   SpO2 94%   BMI 37.41 kg/m    GEN: Well nourished, well developed, in no acute distress  HEENT: normal  Neck: no JVD, carotid bruits, or masses Cardiac: RRR; no murmurs, rubs, or gallops,no edema  Respiratory:  clear to auscultation bilaterally, normal work of breathing GI: soft, nontender, nondistended, + BS MS: no deformity or atrophy  Skin: warm and dry, no rash Neuro:   Alert and Oriented x 3, Strength and sensation are intact Psych: euthymic mood, full affect  Wt Readings from Last 3 Encounters:  06/08/19 231 lb 12.8 oz (105.1 kg)  05/27/19 225 lb (102.1 kg)  04/30/19 220 lb (99.8 kg)      Studies/Labs Reviewed:   EKG:  EKG is not ordered today.    Recent Labs: 11/20/2018: ALT 13; TSH 1.22 12/26/2018: BUN 15; Creatinine, Ser 1.07; Hemoglobin 13.3; Platelets 307; Potassium 4.3; Sodium 138   Lipid Panel    Component Value Date/Time   CHOL 196 11/20/2018 1532   TRIG 269.0 (H) 11/20/2018 1532   TRIG 231 (HH) 09/13/2006 0957   HDL 43.60 11/20/2018 1532   CHOLHDL 4 11/20/2018 1532   VLDL 53.8 (H) 11/20/2018 1532   LDLCALC 86 11/25/2017 1021   LDLDIRECT 109.0 11/20/2018 1532    Additional studies/ records that were reviewed today include:   Echo 01/01/2019   1. The left ventricle has normal systolic function with an ejection fraction of 60-65%. The cavity size was normal. Left ventricular diastolic Doppler parameters are consistent with impaired relaxation.  2. The right ventricle has normal systolic function. The cavity was mildly enlarged. There is no increase in right ventricular wall thickness.  3. Left atrial size was moderately dilated.  4. The mitral valve is normal in structure.  5. The tricuspid valve is normal in structure.  6. The aortic valve is tricuspid Mild thickening of the aortic valve Mild calcification of the aortic valve.  7. The pulmonic valve was normal in structure. Pulmonic valve regurgitation is mild by color flow Doppler.  8. There is mild to moderate dilatation of the ascending aorta measuring 39 mm.  ASSESSMENT:    1. Essential hypertension   2. Hyperlipidemia, unspecified hyperlipidemia type   3. PAF (paroxysmal atrial fibrillation) ([place])   4. OSA on CPAP      PLAN:  In order of problems listed above:  1. Hypertension: Blood pressure improved after starting on CPAP therapy  2. Hyperlipidemia:  Triglycerides remain elevated.  Continue Lipitor.  Add 1000 mg twice daily of OTC fish oil.  Fasting lipid panel in 3 months  3. PAF: Maintaining sinus rhythm based on physical exam.  Continue rate control therapy with diltiazem, metoprolol and also anticoagulation with Eliquis.  4. Obstructive sleep apnea on CPAP: Obstructive sleep apnea was recently diagnosed and patient was titrated on CPAP therapy in June 2020.  Since then, he has been compliant with CPAP therapy and had improvement in his blood pressure and heart rate.    Medication Adjustments/Labs and Tests Ordered: Current medicines are reviewed at length with the patient today.  Concerns regarding medicines are outlined above.  Medication changes, Labs and Tests ordered today are listed in the Patient Instructions below. Patient Instructions  Medication Instructions:   ADD 1000 mg Fish Oil 2 times a day- Over the counter If you need a refill on your cardiac medications before your next appointment, please call your pharmacy.   Lab work: You will need to have labs (blood work) drawn in October 2020 the morning of your appointment with Dr. Claiborne Billings:  Fasting Lipid Panel-DO NOT eat or drink past midnight. Water is ok to drink   If you have labs (blood work) drawn today and your tests are completely normal, you will receive your results only by: Marland Kitchen MyChart Message (if you have MyChart) OR . A paper copy in the mail If you have any lab test that is abnormal or we need to change your treatment, we will call you to review the results.  Testing/Procedures: NONE ordered at this time of appointment   Follow-Up: At [facility], you and your health needs are our priority.  As part of our continuing mission to provide you with exceptional heart care, we have created designated Provider Care Teams.  These Care Teams include your primary Cardiologist (physician) and Advanced Practice Providers (APPs -  Physician Assistants and Nurse  Practitioners) who all work together to provide you with the care you need, when you need it. . You will need a follow up appointment in 6 months.  Please call our office in November to schedule this appointment.  You may see Sanda Klein, MD  . Your physician recommends that you keep scheduled appointment with Troy Sine, MD on August 13, 2019  Any Other Special Instructions Will Be Listed Below (If Applicable).      Hilbert Corrigan, [place]  06/08/2019 3:41 PM    [facility] [address], [place],   [address] Phone: [phone]; Fax: [phone]

## 2019-06-15 NOTE — Progress Notes (Signed)
TY

## 2019-06-17 ENCOUNTER — Encounter: Payer: Self-pay | Admitting: Internal Medicine

## 2019-06-17 ENCOUNTER — Other Ambulatory Visit: Payer: Self-pay

## 2019-06-17 ENCOUNTER — Ambulatory Visit (INDEPENDENT_AMBULATORY_CARE_PROVIDER_SITE_OTHER): Payer: Medicare Other | Admitting: Internal Medicine

## 2019-06-17 ENCOUNTER — Ambulatory Visit (INDEPENDENT_AMBULATORY_CARE_PROVIDER_SITE_OTHER)
Admission: RE | Admit: 2019-06-17 | Discharge: 2019-06-17 | Disposition: A | Discharge status: Home / Self Care | Payer: Medicare Other | Source: Ambulatory Visit | Attending: Internal Medicine | Admitting: Internal Medicine

## 2019-06-17 ENCOUNTER — Other Ambulatory Visit: Payer: Medicare Other

## 2019-06-17 DIAGNOSIS — M21969 Unspecified acquired deformity of unspecified lower leg: Secondary | ICD-10-CM | POA: Insufficient documentation

## 2019-06-17 DIAGNOSIS — L820 Inflamed seborrheic keratosis: Secondary | ICD-10-CM | POA: Diagnosis not present

## 2019-06-17 DIAGNOSIS — M2011 Hallux valgus (acquired), right foot: Secondary | ICD-10-CM | POA: Diagnosis not present

## 2019-06-17 DIAGNOSIS — D485 Neoplasm of uncertain behavior of skin: Secondary | ICD-10-CM | POA: Diagnosis not present

## 2019-06-17 DIAGNOSIS — R609 Edema, unspecified: Secondary | ICD-10-CM

## 2019-06-17 DIAGNOSIS — M21961 Unspecified acquired deformity of right lower leg: Secondary | ICD-10-CM

## 2019-06-17 NOTE — Assessment & Plan Note (Signed)
See skin bx

## 2019-06-17 NOTE — Progress Notes (Signed)
Subjective:  Patient ID: Cody Frazier, male    DOB: [birth year]  Age: 73 y.o. MRN: [id number]  CC: Foot Pain (RIght x 2 weeks) and Nevus (multiple on face)   HPI JETT FUKUDA presents for skin bx C/o R>>L toes "collapsed" x 2 weeks C/o leg swelling   Outpatient Medications Prior to Visit  Medication Sig Dispense Refill  . acetaminophen (TYLENOL) 500 MG tablet Take 1,000 mg by mouth every 6 (six) hours as needed for mild pain or moderate pain.    Marland Kitchen atorvastatin (LIPITOR) 20 MG tablet TAKE 1 TABLET BY MOUTH EVERY DAY (Patient taking differently: Take 20 mg by mouth every morning. ) 90 tablet 3  . cetirizine (ZYRTEC) 10 MG tablet Take 10 mg by mouth daily.    Marland Kitchen diltiazem (CARDIZEM CD) 240 MG 24 hr capsule Take 1 capsule (240 mg total) by mouth daily. 90 capsule 3  . ELIQUIS 2.5 MG TABS tablet TAKE 1 TABLET BY MOUTH TWICE A DAY 60 tablet 11  . fluticasone (FLONASE) 50 MCG/ACT nasal spray SPRAY 2 SPRAYS INTO EACH NOSTRIL EVERY DAY 48 mL 1  . latanoprost (XALATAN) 0.005 % ophthalmic solution Place 1 drop into both eyes at bedtime.  11  . metoprolol succinate (TOPROL-XL) 25 MG 24 hr tablet TAKE 1 TABLET BY MOUTH EVERY DAY 90 tablet 2  . Multiple Vitamins-Minerals (CENTRUM SILVER PO) Take by mouth daily.      Marland Kitchen oxyCODONE (ROXICODONE) 15 MG immediate release tablet Take 1 tablet (15 mg total) by mouth 4 (four) times daily as needed for pain. 120 tablet 0  . oxyCODONE (ROXICODONE) 15 MG immediate release tablet Take 1 tablet (15 mg total) by mouth 4 (four) times daily. 120 tablet 0  . oxyCODONE (ROXICODONE) 15 MG immediate release tablet Take 1 tablet (15 mg total) by mouth 4 (four) times daily. 120 tablet 0  . pantoprazole (PROTONIX) 40 MG tablet Take 1 tablet (40 mg total) by mouth 2 (two) times daily. 60 tablet 11  . polyethylene glycol (MIRALAX / GLYCOLAX) packet Take 17 g by mouth daily. (Patient taking differently: Take 17 g by mouth daily as needed for mild constipation. ) 14 each 0   . telmisartan (MICARDIS) 80 MG tablet TAKE 1 TABLET BY MOUTH EVERY DAY 90 tablet 2  . triamcinolone cream (KENALOG) 0.5 % Apply 1 application topically 3 (three) times daily. 90 g 1   No facility-administered medications prior to visit.     ROS: Review of Systems  Constitutional: Positive for fatigue and unexpected weight change. Negative for appetite change.  HENT: Negative for congestion, nosebleeds, sneezing, sore throat and trouble swallowing.   Eyes: Negative for itching and visual disturbance.  Respiratory: Negative for cough and shortness of breath.   Cardiovascular: Negative for chest pain, palpitations and leg swelling.  Gastrointestinal: Negative for abdominal distention, blood in stool, diarrhea and nausea.  Genitourinary: Negative for frequency and hematuria.  Musculoskeletal: Positive for arthralgias, back pain and gait problem. Negative for joint swelling and neck pain.  Skin: Positive for color change. Negative for rash.  Neurological: Negative for dizziness, tremors, speech difficulty and weakness.  Psychiatric/Behavioral: Negative for agitation, dysphoric mood and sleep disturbance. The patient is not nervous/anxious.     Objective:  BP 130/72 (BP Location: Left Arm, Patient Position: Sitting, Cuff Size: Large)   Pulse 70   Temp 98.7 F (37.1 C) (Oral)   Ht 5\' 6"  (1.676 m)   Wt 230 lb (104.3 kg)   SpO2 95%  BMI 37.12 kg/m   BP Readings from Last 3 Encounters:  06/17/19 130/72  06/08/19 124/82  05/27/19 136/84    Wt Readings from Last 3 Encounters:  06/17/19 230 lb (104.3 kg)  06/08/19 231 lb 12.8 oz (105.1 kg)  05/27/19 225 lb (102.1 kg)    Physical Exam Constitutional:      General: He is not in acute distress.    Appearance: He is well-developed.     Comments: NAD  Eyes:     Conjunctiva/sclera: Conjunctivae normal.     Pupils: Pupils are equal, round, and reactive to light.  Neck:     Musculoskeletal: Normal range of motion.     Thyroid: No  thyromegaly.     Vascular: No JVD.  Cardiovascular:     Rate and Rhythm: Normal rate and regular rhythm.     Heart sounds: Normal heart sounds. No murmur. No friction rub. No gallop.   Pulmonary:     Effort: Pulmonary effort is normal. No respiratory distress.     Breath sounds: Normal breath sounds. No wheezing or rales.  Chest:     Chest wall: No tenderness.  Abdominal:     General: Bowel sounds are normal. There is no distension.     Palpations: Abdomen is soft. There is no mass.     Tenderness: There is no abdominal tenderness. There is no guarding or rebound.  Musculoskeletal: Normal range of motion.        General: Tenderness and deformity present.     Right lower leg: Edema present.     Left lower leg: Edema present.  Lymphadenopathy:     Cervical: No cervical adenopathy.  Skin:    General: Skin is warm and dry.     Findings: No rash.  Neurological:     Mental Status: He is alert and oriented to person, place, and time.     Cranial Nerves: No cranial nerve deficit.     Motor: No abnormal muscle tone.     Coordination: Coordination normal.     Gait: Gait normal.     Deep Tendon Reflexes: Reflexes are normal and symmetric.  Psychiatric:        Behavior: Behavior normal.        Thought Content: Thought content normal.        Judgment: Judgment normal.       Procedure Note :     Procedure :  Skin biopsy   Indication:  Changing mole (s ),  Suspicious lesion(s)   Risks including unsuccessful procedure , bleeding, infection, bruising, scar, a need for another complete procedure and others were explained to the patient in detail as well as the benefits. Informed consent was obtained.  The patient was placed in a decubitus position.  Lesion #1 on   left temple measuring 16 x 11   mm   Skin over lesion #1  was prepped with Betadine and alcohol  and anesthetized with 1 cc of 2% lidocaine and epinephrine, using a 25-gauge 1 inch needle.  Shave biopsy with a sterile  Dermablade was carried out in the usual fashion. Hyfrecator was used to destroy the rest of the lesion potentially left behind and for hemostasis. Band-Aid was applied with antibiotic ointment.    Lesion #3 on right cheek bone    measuring 6 x 4 mm   Skin over lesion #2  was prepped with Betadine and alcohol  and anesthetized with 1 cc of 2% lidocaine and epinephrine, using a 25-gauge 1  inch needle.  Shave biopsy with a sterile Dermablade was carried out in the usual fashion. Hyfrecator was used to destroy the rest of the lesion potentially left behind and for hemostasis. Band-Aid was applied with antibiotic ointment.  Lesion #2 on the ala of the right nostril   measuring 6 x 3 mm   Skin over lesion #3  was prepped with Betadine and alcohol  and anesthetized with 1 cc of 2% lidocaine and epinephrine, using a 25-gauge 1 inch needle.  Shave biopsy with a sterile Dermablade was carried out in the usual fashion. Hyfrecator was used to destroy the rest of the lesion potentially left behind and for hemostasis. Band-Aid was applied with antibiotic ointment.   Lesion #4 on the right temple above #3   measuring 2 x 2 mm   Skin over lesion #3  was prepped with Betadine and alcohol  and anesthetized with 1 cc of 2% lidocaine and epinephrine, using a 25-gauge 1 inch needle.  Shave biopsy with a sterile Dermablade was carried out in the usual fashion. Hyfrecator was used to destroy the rest of the lesion potentially left behind and for hemostasis. Band-Aid was applied with antibiotic ointment.  This lesion was discarded  Tolerated well. Complications none.   Bilateral foot edema with toes somewhat pointing forward, nontender     Lab Results  Component Value Date   WBC 5.8 12/26/2018   HGB 13.3 12/26/2018   HCT 41.9 12/26/2018   PLT 307 12/26/2018   GLUCOSE 124 (H) 12/26/2018   CHOL 196 11/20/2018   TRIG 269.0 (H) 11/20/2018   HDL 43.60 11/20/2018   LDLDIRECT 109.0 11/20/2018   LDLCALC 86  11/25/2017   ALT 13 11/20/2018   AST 13 11/20/2018   NA 138 12/26/2018   K 4.3 12/26/2018   CL 104 12/26/2018   CREATININE 1.07 12/26/2018   BUN 15 12/26/2018   CO2 22 12/26/2018   TSH 1.22 11/20/2018   PSA 0.16 11/20/2018   INR 1.1 01/06/2019   HGBA1C 5.7 11/13/2016    No results found.  Assessment & Plan:   There are no diagnoses linked to this encounter.   No orders of the defined types were placed in this encounter.    Follow-up: No follow-ups on file.  Walker Kehr, MD

## 2019-06-17 NOTE — Assessment & Plan Note (Signed)
R foot is worse after 2nd back surgery X ray Podiatry ref

## 2019-06-20 ENCOUNTER — Encounter: Payer: Self-pay | Admitting: Internal Medicine

## 2019-06-20 DIAGNOSIS — R609 Edema, unspecified: Secondary | ICD-10-CM | POA: Insufficient documentation

## 2019-06-20 NOTE — Assessment & Plan Note (Signed)
Bilateral feet and ankles, likely due to chronic venous insufficiency No signs of CHF Lose weight, use compression socks, elevate legs X-ray

## 2019-06-29 ENCOUNTER — Telehealth: Payer: Self-pay

## 2019-06-29 NOTE — Telephone Encounter (Signed)
LVM informing patient that the referral was placed and sent to [facility]. Gave patient the number to call them and make an appointment since they already have the referral and that may go faster then waiting for them to call.

## 2019-06-29 NOTE — Telephone Encounter (Signed)
Copied from [place] [id number]. Topic: General - Inquiry >> Jun 26, 2019  8:38 AM Richardo Priest, NT wrote: Reason for CRM: Patient called in wanting to speak with a nurse to see about of referral for his foot. Please advise. Call back is [phone].

## 2019-07-01 ENCOUNTER — Other Ambulatory Visit: Payer: Self-pay

## 2019-07-01 ENCOUNTER — Ambulatory Visit (INDEPENDENT_AMBULATORY_CARE_PROVIDER_SITE_OTHER): Payer: Medicare Other

## 2019-07-01 ENCOUNTER — Other Ambulatory Visit: Payer: Self-pay | Admitting: Podiatry

## 2019-07-01 ENCOUNTER — Ambulatory Visit (INDEPENDENT_AMBULATORY_CARE_PROVIDER_SITE_OTHER): Payer: Medicare Other | Admitting: Podiatry

## 2019-07-01 ENCOUNTER — Encounter: Payer: Self-pay | Admitting: Podiatry

## 2019-07-01 VITALS — Temp 98.1°F

## 2019-07-01 DIAGNOSIS — R2681 Unsteadiness on feet: Secondary | ICD-10-CM | POA: Diagnosis not present

## 2019-07-01 DIAGNOSIS — M79671 Pain in right foot: Secondary | ICD-10-CM | POA: Diagnosis not present

## 2019-07-01 DIAGNOSIS — L84 Corns and callosities: Secondary | ICD-10-CM | POA: Diagnosis not present

## 2019-07-01 DIAGNOSIS — D689 Coagulation defect, unspecified: Secondary | ICD-10-CM | POA: Diagnosis not present

## 2019-07-01 DIAGNOSIS — M79672 Pain in left foot: Secondary | ICD-10-CM | POA: Diagnosis not present

## 2019-07-01 NOTE — Progress Notes (Signed)
Subjective:   Patient ID: Cody Frazier, male   DOB: 73 y.o.   MRN: [id number]   HPI Patient presents stating he has had numbness in his right foot since back surgery 8 years ago and developed nerve pain that is burning and gait instability and has had 2 falls and did break his wrist his last fall and states that he is unstable on his right foot.  Has lesion formation under it and hammertoe deformity bilateral and patient does not smoke and would like to be more active   Review of Systems  All other systems reviewed and are negative.       Objective:  Physical Exam Vitals signs and nursing note reviewed.  Constitutional:      Appearance: He is well-developed.  Pulmonary:     Effort: Pulmonary effort is normal.  Musculoskeletal: Normal range of motion.  Skin:    General: Skin is warm.  Neurological:     Mental Status: He is alert.     Vascular status intact bilateral diminished neurological sensation right upper left with diminished muscle strength noted and instability around the ankle.  Severe digital deformities bilateral with keratotic lesion subsecond metatarsal right with the patient on blood thinner and at risk of trimming this himself     Assessment:  Reviewed findings in gait instability and I recommended balance bracing and I am referring him to ped orthotist for this to be made.  I did debride the lesion     Plan:  H&P was performed and his x-rays reviewed and I do think balance bracing will be of great benefit to him and patient is referred to have these made at the current time.  X-rays indicate

## 2019-07-07 ENCOUNTER — Other Ambulatory Visit: Payer: Self-pay | Admitting: Internal Medicine

## 2019-07-14 ENCOUNTER — Other Ambulatory Visit: Payer: Medicare Other | Admitting: Orthotics

## 2019-07-21 ENCOUNTER — Other Ambulatory Visit: Payer: Self-pay

## 2019-07-21 ENCOUNTER — Ambulatory Visit: Payer: Medicare Other | Admitting: Orthotics

## 2019-07-21 DIAGNOSIS — L84 Corns and callosities: Secondary | ICD-10-CM

## 2019-07-21 DIAGNOSIS — M21969 Unspecified acquired deformity of unspecified lower leg: Secondary | ICD-10-CM

## 2019-07-21 DIAGNOSIS — R2681 Unsteadiness on feet: Secondary | ICD-10-CM

## 2019-07-21 NOTE — Progress Notes (Signed)
Patient came in today to pick up Moore Balance Brace (Bilateral).   Patient was able to don brace independently and brace was check for fit to custom.   Patient was observed walking with brace and gait was improved as well as stability.   Patient was advised of care and wearing instructions.  Advised to notify practice if there were any issues; especially skin irritation.  

## 2019-08-10 ENCOUNTER — Other Ambulatory Visit: Payer: Self-pay

## 2019-08-10 DIAGNOSIS — K2270 Barrett's esophagus without dysplasia: Secondary | ICD-10-CM

## 2019-08-10 DIAGNOSIS — D508 Other iron deficiency anemias: Secondary | ICD-10-CM

## 2019-08-10 DIAGNOSIS — H401131 Primary open-angle glaucoma, bilateral, mild stage: Secondary | ICD-10-CM | POA: Diagnosis not present

## 2019-08-10 MED ORDER — PANTOPRAZOLE SODIUM 40 MG PO TBEC: 40.0000 mg | DELAYED_RELEASE_TABLET | Freq: Two times a day (BID) | ORAL | 0 refills | Status: DC

## 2019-08-13 ENCOUNTER — Other Ambulatory Visit: Payer: Self-pay

## 2019-08-13 ENCOUNTER — Encounter: Payer: Self-pay | Admitting: Cardiovascular Disease

## 2019-08-13 ENCOUNTER — Ambulatory Visit (INDEPENDENT_AMBULATORY_CARE_PROVIDER_SITE_OTHER): Payer: Medicare Other | Admitting: Cardiovascular Disease

## 2019-08-13 VITALS — BP 152/90 | HR 52 | Temp 97.0°F | Ht 66.0 in | Wt 230.0 lb

## 2019-08-13 DIAGNOSIS — E785 Hyperlipidemia, unspecified: Secondary | ICD-10-CM

## 2019-08-13 DIAGNOSIS — I10 Essential (primary) hypertension: Secondary | ICD-10-CM | POA: Diagnosis not present

## 2019-08-13 DIAGNOSIS — G4733 Obstructive sleep apnea (adult) (pediatric): Secondary | ICD-10-CM | POA: Diagnosis not present

## 2019-08-13 DIAGNOSIS — I480 Paroxysmal atrial fibrillation: Secondary | ICD-10-CM

## 2019-08-13 DIAGNOSIS — Z7901 Long term (current) use of anticoagulants: Secondary | ICD-10-CM | POA: Diagnosis not present

## 2019-08-13 DIAGNOSIS — I272 Pulmonary hypertension, unspecified: Secondary | ICD-10-CM

## 2019-08-13 MED ORDER — HYDROCHLOROTHIAZIDE 12.5 MG PO CAPS: 12.5000 mg | ORAL_CAPSULE | Freq: Every day | ORAL | 3 refills | Status: DC

## 2019-08-13 NOTE — Patient Instructions (Signed)
Medication Instructions:  Start Hydrochlorothiazide 12.5 mg daily.  If you need a refill on your cardiac medications before your next appointment, please call your pharmacy.    Follow-Up: At [facility], you and your health needs are our priority.  As part of our continuing mission to provide you with exceptional heart care, we have created designated Provider Care Teams.  These Care Teams include your primary Cardiologist (physician) and Advanced Practice Providers (APPs -  Physician Assistants and Nurse Practitioners) who all work together to provide you with the care you need, when you need it. You will need a follow up appointment in 12 months (sleep).  Please call our office 2 months in advance to schedule this appointment.  You may see Dr.Thomas Claiborne Billings or one of the following Advanced Practice Providers on your designated Care Team: Almyra Deforest, [place] . Fabian Sharp, PA-C

## 2019-08-15 ENCOUNTER — Encounter: Payer: Self-pay | Admitting: Cardiovascular Disease

## 2019-08-15 NOTE — Progress Notes (Signed)
Cardiology Office Note    Date:  08/15/2019   ID:  Trampus, Mcquerry 05-Oct-1946, MRN [id number]  PCP:  Cassandria Anger, MD  Cardiologist:  Shelva Majestic, MD (sleep); Dr. Sallyanne Kuster  New sleep evaluation  History of Present Illness:  Cody Frazier is a 73 y.o. male who presents for new sleep evaluation following his recent evaluation for sleep apnea and initiation of CPAP therapy.  Cody Frazier is a former patient of Dr. Wynonia Lawman who now sees Dr. Sallyanne Kuster.  The patient has a history of hypertension, hyperlipidemia, and paroxysmal atrial fibrillation on anticoagulation therapy.  He underwent an echo Doppler study in February 2020 which showed an EF of 60 to 65% with grade 1 diastolic dysfunction.  There was mild aortic sclerosis, mild pulmonary regurgitation and mild dilation of his ascending aorta.  Due to concerns for obstructive sleep apnea he was referred for a sleep study.  Insurance denied an in lab study and he initially underwent a home sleep evaluation on January 28, 2019.  The home study demonstrated severe sleep apnea with an AHI of 50.9/h.  Events were worse with this supine sleep with AHI 53.9/h versus nonsupine sleep at 26.35/h.  He had severe oxygen desaturation to a nadir of 75%.  Time spent under 89% was 83 minutes.  He snored for 12.4% of his sleep.  He was referred for CPAP titration and fortunately insurance approved an in lab study which was done on April 30, 2019.  CPAP was increased to 13 cm water pressure with excellent result with an AHI of 0 and oxygen nadir at 91%.  His CPAP set up date was May 25, 2019 with [facility] as his DME company.  He has been using a full facemask.  A download was obtained in the office today from July 14, 2019 through August 12, 2019.  This reveals excellent compliance with 100% usage days and average usage at 7 hours and 44 minutes.  At his 13 cm set pressure, AHI is 2.7.  There is no mask leak.  Since initiating CPAP therapy, he  notes significant improvement in his sleep.  He denies frequent awakenings.  He is unaware of breakthrough snoring.  Previous nocturia 3 times per night has essentially resolved.  He is dreaming.  Most nights he sleeps without waking up.  He denies any hypnagogic hallucinations or sleep paralysis.  He denies any residual daytime sleepiness.  An Epworth Sleepiness Scale score was calculated in the office today and this endorsed at 5 arguing against residual daytime sleepiness.  He typically goes to bed between 11 and 1130 each night and wakes up between 7 and 8 AM.  He has been on diltiazem 240 mg, metoprolol 25 mg daily and telmisartan 80 mg for hypertension as well as his PAF.  He is on Eliquis anticoagulation.  He is on atorvastatin 20 mg daily for hyperlipidemia.   Past Medical History:  Diagnosis Date  . Allergic rhinitis   . Anemia, iron deficiency   . Atrial arrhythmia   . Atrial fib/flutter, transient    following prior surgeries x 2  . Barrett's esophagus without dysplasia   . BPH (benign prostatic hyperplasia)   . Bright's disease   . Chronic LBP   . Closed fracture of right distal radius   . DDD (degenerative disc disease)   . Diverticulosis of colon   . Duodenal stricture   . Gastric outlet obstruction   . GERD (gastroesophageal reflux disease)   .  Glaucoma   . Hemorrhoids   . History of kidney stones   . Hyperlipidemia   . Hyperplastic colonic polyp 01/2000  . Hypertension   . Nephrolithiasis    hx of B  . Peptic ulcer disease with hemorrhage 08/2008   and [facility]. Pylori Ab negative  . Renal cyst   . Tubular adenoma of colon 03/2012  . Wears glasses     Past Surgical History:  Procedure Laterality Date  . APPENDECTOMY    . BACK SURGERY  02/2003   L5  . BILROTH I PROCEDURE  2011   Dr Johney Maine  . CLOSED REDUCTION WRIST FRACTURE Right 01/06/2019   Procedure: Closed Reduction Wrist/Pinning Wrist;  Surgeon: Dayna Barker, MD;  Location: [facility];  Service: Plastics;   Laterality: Right;  . COLONOSCOPY    . HIATAL HERNIA REPAIR    . LAPAROTOMY N/A 05/10/2017   Procedure: EXPLORATORY LAPAROTOMY WITH ENTEROLYSIS;  Surgeon: Johnathan Hausen, MD;  Location: [facility];  Service: General;  Laterality: N/A;  . LUMBAR FUSION  2009  . TONSILLECTOMY      Current Medications: Outpatient Medications Prior to Visit  Medication Sig Dispense Refill  . acetaminophen (TYLENOL) 500 MG tablet Take 1,000 mg by mouth every 6 (six) hours as needed for mild pain or moderate pain.    Marland Kitchen atorvastatin (LIPITOR) 20 MG tablet TAKE 1 TABLET BY MOUTH EVERY DAY 90 tablet 3  . cetirizine (ZYRTEC) 10 MG tablet Take 10 mg by mouth daily.    Marland Kitchen diltiazem (CARDIZEM CD) 240 MG 24 hr capsule Take 1 capsule (240 mg total) by mouth daily. 90 capsule 3  . ELIQUIS 2.5 MG TABS tablet TAKE 1 TABLET BY MOUTH TWICE A DAY 60 tablet 11  . fluticasone (FLONASE) 50 MCG/ACT nasal spray SPRAY 2 SPRAYS INTO EACH NOSTRIL EVERY DAY 48 mL 1  . latanoprost (XALATAN) 0.005 % ophthalmic solution Place 1 drop into both eyes at bedtime.  11  . metoprolol succinate (TOPROL-XL) 25 MG 24 hr tablet TAKE 1 TABLET BY MOUTH EVERY DAY 90 tablet 2  . Multiple Vitamins-Minerals (CENTRUM SILVER PO) Take by mouth daily.      Marland Kitchen oxyCODONE (ROXICODONE) 15 MG immediate release tablet Take 1 tablet (15 mg total) by mouth 4 (four) times daily as needed for pain. 120 tablet 0  . oxyCODONE (ROXICODONE) 15 MG immediate release tablet Take 1 tablet (15 mg total) by mouth 4 (four) times daily. 120 tablet 0  . oxyCODONE (ROXICODONE) 15 MG immediate release tablet Take 1 tablet (15 mg total) by mouth 4 (four) times daily. 120 tablet 0  . pantoprazole (PROTONIX) 40 MG tablet Take 1 tablet (40 mg total) by mouth 2 (two) times daily. 60 tablet 0  . polyethylene glycol (MIRALAX / GLYCOLAX) packet Take 17 g by mouth daily. (Patient taking differently: Take 17 g by mouth daily as needed for mild constipation. ) 14 each 0  . telmisartan (MICARDIS) 80 MG  tablet TAKE 1 TABLET BY MOUTH EVERY DAY 90 tablet 2  . timolol (TIMOPTIC) 0.25 % ophthalmic solution INSTILL 1 DROP BY OPHTHALMIC ROUTE TWICE DAILY INTO BOTH EYES    . triamcinolone cream (KENALOG) 0.5 % Apply 1 application topically 3 (three) times daily. 90 g 1   No facility-administered medications prior to visit.      Allergies:   Codeine phosphate, Nsaids, and Adhesive [tape]   Social History   Socioeconomic History  . Marital status: Married    Spouse name: Jacory Kamel  .  Number of children: 1  . Years of education: Not on file  . Highest education level: Not on file  Occupational History  . Occupation: Mining engineer: [employer]  Social Needs  . Financial resource strain: Not hard at all  . Food insecurity    Worry: Never true    Inability: Never true  . Transportation needs    Medical: No    Non-medical: No  Tobacco Use  . Smoking status: Former Research scientist (life sciences)  . Smokeless tobacco: Never Used  . Tobacco comment: as a teenager  Substance and Sexual Activity  . Alcohol use: No  . Drug use: No  . Sexual activity: Yes  Lifestyle  . Physical activity    Days per week: 0 days    Minutes per session: 0 min  . Stress: Not at all  Relationships  . Social connections    Talks on phone: More than three times a week    Gets together: More than three times a week    Attends religious service: More than 4 times per year    Active member of club or organization: Yes    Attends meetings of clubs or organizations: More than 4 times per year    Relationship status: Married  Other Topics Concern  . Not on file  Social History Narrative   Patient gets regular exercise   Married x 42 years     Family History:  The patient's family history includes Cancer in his cousin, father, mother, and paternal uncle; Colon cancer (age of onset: 80) in his paternal uncle and paternal uncle; Colon cancer (age of onset: 28) in his father; Colon cancer (age of onset: 93) in his  mother; Colon polyps in his father and mother; Hypertension in an other family member.   ROS General: Negative; No fevers, chills, or night sweats;  HEENT: Negative; No changes in vision or hearing, sinus congestion, difficulty swallowing Pulmonary: Negative; No cough, wheezing, shortness of breath, hemoptysis Cardiovascular: Negative; No chest pain, presyncope, syncope, palpitations GI: Negative; No nausea, vomiting, diarrhea, or abdominal pain GU: Negative; No dysuria, hematuria, or difficulty voiding Musculoskeletal: He is status post 2 back surgeries making walking difficult Hematologic/Oncology: Negative; no easy bruising, bleeding Endocrine: Negative; no heat/cold intolerance; no diabetes Neuro: Negative; no changes in balance, headaches Skin: Negative; No rashes or skin lesions Psychiatric: Negative; No behavioral problems, depression Sleep: Negative; No snoring, daytime sleepiness, hypersomnolence, bruxism, restless legs, hypnogognic hallucinations, no cataplexy Other comprehensive 14 point system review is negative.   PHYSICAL EXAM:   VS:  BP (!) 152/90   Pulse (!) 52   Temp (!) 97 F (36.1 C)   Ht '5\' 6"'  (1.676 m)   Wt 230 lb (104.3 kg)   SpO2 97%   BMI 37.12 kg/m     Repeat blood pressure by me was 158/90  Wt Readings from Last 3 Encounters:  08/13/19 230 lb (104.3 kg)  06/17/19 230 lb (104.3 kg)  06/08/19 231 lb 12.8 oz (105.1 kg)    General: Alert, oriented, no distress.  Moderate obesity. Skin: normal turgor, no rashes, warm and dry HEENT: Normocephalic, atraumatic. Pupils equal round and reactive to light; sclera anicteric; extraocular muscles intact;  Nose without nasal septal hypertrophy Mouth/Parynx benign; Mallinpatti scale 4 Neck: No JVD, no carotid bruits; normal carotid upstroke Lungs: clear to ausculatation and percussion; no wheezing or rales Chest wall: without tenderness to palpitation Heart: PMI not displaced, RRR, s1 s2 normal, 1/6 systolic  murmur, no  diastolic murmur, no rubs, gallops, thrills, or heaves Abdomen: soft, nontender; no hepatosplenomehaly, BS+; abdominal aorta nontender and not dilated by palpation. Back: no CVA tenderness Pulses 2+ Musculoskeletal: full range of motion, normal strength, no joint deformities Extremities: Mild ankle edema;no clubbing cyanosis, Homan's sign negative  Neurologic: grossly nonfocal; Cranial nerves grossly wnl Psychologic: Normal mood and affect   Studies/Labs Reviewed:   January 13, 2019 ECG (independently read by me): Normal sinus rhythm at 84 bpm.  PR interval 202 ms.  No ectopy.  Recent Labs: BMP Latest Ref Rng & Units 12/26/2018 11/20/2018 05/22/2017  Glucose 70 - 99 mg/dL 124(H) 96 129(H)  BUN 8 - 23 mg/dL '15 16 19  ' Creatinine 0.61 - 1.24 mg/dL 1.07 0.99 1.28  Sodium 135 - 145 mmol/L 138 140 139  Potassium 3.5 - 5.1 mmol/L 4.3 4.5 4.2  Chloride 98 - 111 mmol/L 104 102 105  CO2 22 - 32 mmol/L '22 29 23  ' Calcium 8.9 - 10.3 mg/dL 9.8 10.0 10.4     Hepatic Function Latest Ref Rng & Units 11/20/2018 11/25/2017 05/10/2017  Total Protein 6.0 - 8.3 g/dL 6.6 6.8 7.9  Albumin 3.5 - 5.2 g/dL 4.1 4.0 4.2  AST 0 - 37 U/L 13 12 47(H)  ALT 0 - 53 U/L '13 13 21  ' Alk Phosphatase 39 - 117 U/L 125(H) 136(H) 113  Total Bilirubin 0.2 - 1.2 mg/dL 0.3 0.6 0.6  Bilirubin, Direct 0.0 - 0.3 mg/dL 0.1 0.1 -    CBC Latest Ref Rng & Units 12/26/2018 11/20/2018 11/25/2017  WBC 4.0 - 10.5 K/uL 5.8 7.7 6.5  Hemoglobin 13.0 - 17.0 g/dL 13.3 14.2 13.8  Hematocrit 39.0 - 52.0 % 41.9 42.1 41.3  Platelets 150 - 400 K/uL 307 350.0 296.0   Lab Results  Component Value Date   MCV 94.4 12/26/2018   MCV 93.1 11/20/2018   MCV 94.4 11/25/2017   Lab Results  Component Value Date   TSH 1.22 11/20/2018   Lab Results  Component Value Date   HGBA1C 5.7 11/13/2016     BNP No results found for: BNP  ProBNP No results found for: PROBNP   Lipid Panel     Component Value Date/Time   CHOL 196 11/20/2018 1532    TRIG 269.0 (H) 11/20/2018 1532   TRIG 231 (HH) 09/13/2006 0957   HDL 43.60 11/20/2018 1532   CHOLHDL 4 11/20/2018 1532   VLDL 53.8 (H) 11/20/2018 1532   LDLCALC 86 11/25/2017 1021   LDLDIRECT 109.0 11/20/2018 1532     RADIOLOGY: No results found.   Additional studies/ records that were reviewed today include:  I reviewed the patient's office evaluations, echo Doppler study, recent home sleep study, subsequent CPAP titration study, and obtained a download in the office today.   ASSESSMENT:    1. OSA (obstructive sleep apnea)   2. PAF (paroxysmal atrial fibrillation) ([place] [place])   3. Essential hypertension   4. Paroxysmal atrial fibrillation ([facility])   5. Long term (current) use of anticoagulants   6. Pulmonary hypertension ([place])   7. Hyperlipidemia, unspecified hyperlipidemia type     PLAN:  Cody Frazier is a 73 year old gentleman who has a history of hypertension, hyperlipidemia, as well as paroxysmal atrial fibrillation.  Due to concerns for obstructive sleep apnea with significant snoring, waking up gasping for breath, frequent nocturia, he was referred for sleep study and was found to have severe obstructive sleep apnea.  With an AHI of 50.9/h and oxygen nadir at 75%.  His CPAP  titration study was performed and at 13 cm water pressure AHI was 0 and O2 nadir 91%.  There was resolution of snoring.  Since initiating CPAP therapy he has noticed dramatic improvement in his sleep quality.  Most nights he is sleeping without waking up.  His nocturia has resolved.  He feels refreshed.  He has no residual daytime sleepiness with an Epworth scale endorsed at 5 in the office today.  I reviewed his sleep study in detail and discussed with him at length the potential adverse consequences of untreated sleep apnea on his cardiovascular health.  Reportedly, his blood pressures have improved since CPAP has been initiated.  However, blood pressure today is still elevated and he admits to periods of 1+  ankle edema.  I discussed with him also most recent hypertensive guidelines with ideal blood pressure less than 120/80 and stage I hypertension commencing at 130/80.  I have recommended the addition of hydrochlorothiazide to start at 12.5 mg with his elevated blood pressure today.  I recommended that he follow-up with Dr. Alain Marion his primary physician and he has an appointment already scheduled with him in 2 weeks.  At that time if blood pressure is still elevated further titration of medication may be necessary for him to achieve optimal blood pressure control.  From a sleep perspective he is doing well.  He does have moderate obesity.  I discussed the importance of weight loss and exercise.  He believes his walking is limited due to difficulty from his back following his 2 back surgeries.  He is on atorvastatin for hyperlipidemia.  From a sleep perspective he is doing well and I will see him in 1 year for reevaluation per Medicare guidelines.   Medication Adjustments/Labs and Tests Ordered: Current medicines are reviewed at length with the patient today.  Concerns regarding medicines are outlined above.  Medication changes, Labs and Tests ordered today are listed in the Patient Instructions below. Patient Instructions  Medication Instructions:  Start Hydrochlorothiazide 12.5 mg daily.  If you need a refill on your cardiac medications before your next appointment, please call your pharmacy.    Follow-Up: At [facility], you and your health needs are our priority.  As part of our continuing mission to provide you with exceptional heart care, we have created designated Provider Care Teams.  These Care Teams include your primary Cardiologist (physician) and Advanced Practice Providers (APPs -  Physician Assistants and Nurse Practitioners) who all work together to provide you with the care you need, when you need it. You will need a follow up appointment in 12 months (sleep).  Please call our office  2 months in advance to schedule this appointment.  You may see Dr.Thomas Claiborne Billings or one of the following Advanced Practice Providers on your designated Care Team: Almyra Deforest, [place] . Fabian Sharp, PA-C        Signed, Shelva Majestic, MD  08/15/2019 3:38 PM    [facility] [address] [place], [place], [place]  [address] Phone: [phone]

## 2019-08-20 ENCOUNTER — Ambulatory Visit (INDEPENDENT_AMBULATORY_CARE_PROVIDER_SITE_OTHER): Payer: Medicare Other | Admitting: Orthotics

## 2019-08-20 ENCOUNTER — Other Ambulatory Visit: Payer: Self-pay

## 2019-08-20 DIAGNOSIS — R2681 Unsteadiness on feet: Secondary | ICD-10-CM | POA: Diagnosis not present

## 2019-08-20 DIAGNOSIS — M21969 Unspecified acquired deformity of unspecified lower leg: Secondary | ICD-10-CM | POA: Diagnosis not present

## 2019-08-20 DIAGNOSIS — L84 Corns and callosities: Secondary | ICD-10-CM | POA: Diagnosis not present

## 2019-08-20 NOTE — Progress Notes (Signed)
Patient came in today to pick up Moore Balance Brace (Bilateral).   Patient was able to don brace independently and brace was check for fit to custom.   Patient was observed walking with brace and gait was improved as well as stability.   Patient was advised of care and wearing instructions.  Advised to notify practice if there were any issues; especially skin irritation.  

## 2019-08-26 ENCOUNTER — Other Ambulatory Visit (INDEPENDENT_AMBULATORY_CARE_PROVIDER_SITE_OTHER): Payer: Medicare Other

## 2019-08-26 ENCOUNTER — Ambulatory Visit (INDEPENDENT_AMBULATORY_CARE_PROVIDER_SITE_OTHER): Payer: Medicare Other | Admitting: Internal Medicine

## 2019-08-26 ENCOUNTER — Other Ambulatory Visit: Payer: Self-pay

## 2019-08-26 ENCOUNTER — Encounter: Payer: Self-pay | Admitting: Internal Medicine

## 2019-08-26 ENCOUNTER — Ambulatory Visit: Payer: Medicare Other | Admitting: Internal Medicine

## 2019-08-26 VITALS — BP 130/76 | HR 77 | Temp 98.5°F | Ht 66.0 in | Wt 235.0 lb

## 2019-08-26 DIAGNOSIS — I119 Hypertensive heart disease without heart failure: Secondary | ICD-10-CM

## 2019-08-26 DIAGNOSIS — E1165 Type 2 diabetes mellitus with hyperglycemia: Secondary | ICD-10-CM

## 2019-08-26 DIAGNOSIS — Z6835 Body mass index (BMI) 35.0-35.9, adult: Secondary | ICD-10-CM

## 2019-08-26 DIAGNOSIS — E66812 Obesity, class 2: Secondary | ICD-10-CM

## 2019-08-26 DIAGNOSIS — M519 Unspecified thoracic, thoracolumbar and lumbosacral intervertebral disc disorder: Secondary | ICD-10-CM | POA: Diagnosis not present

## 2019-08-26 DIAGNOSIS — E785 Hyperlipidemia, unspecified: Secondary | ICD-10-CM

## 2019-08-26 LAB — HEPATIC FUNCTION PANEL
ALT: 11 U/L (ref 0–53)
AST: 10 U/L (ref 0–37)
Albumin: 4.2 g/dL (ref 3.5–5.2)
Alkaline Phosphatase: 130 U/L — ABNORMAL HIGH (ref 39–117)
Bilirubin, Direct: 0.1 mg/dL (ref 0.0–0.3)
Total Bilirubin: 0.6 mg/dL (ref 0.2–1.2)
Total Protein: 7.2 g/dL (ref 6.0–8.3)

## 2019-08-26 LAB — BASIC METABOLIC PANEL
BUN: 20 mg/dL (ref 6–23)
CO2: 26 mEq/L (ref 19–32)
Calcium: 10.1 mg/dL (ref 8.4–10.5)
Chloride: 101 mEq/L (ref 96–112)
Creatinine, Ser: 1.26 mg/dL (ref 0.40–1.50)
GFR: 55.95 mL/min — ABNORMAL LOW (ref >60.00)
Glucose, Bld: 110 mg/dL — ABNORMAL HIGH (ref 70–99)
Potassium: 4.3 mEq/L (ref 3.5–5.1)
Sodium: 138 mEq/L (ref 135–145)

## 2019-08-26 LAB — TSH: TSH: 1.55 u[IU]/mL (ref 0.35–4.50)

## 2019-08-26 LAB — HEMOGLOBIN A1C: Hgb A1c MFr Bld: 6 % (ref 4.6–6.5)

## 2019-08-26 MED ORDER — OXYCODONE HCL 15 MG PO TABS: 15.0000 mg | ORAL_TABLET | Freq: Four times a day (QID) | ORAL | 0 refills | Status: DC

## 2019-08-26 MED ORDER — OXYCODONE HCL 15 MG PO TABS: 15.0000 mg | ORAL_TABLET | Freq: Four times a day (QID) | ORAL | 0 refills | Status: DC | PRN

## 2019-08-26 NOTE — Assessment & Plan Note (Signed)
Losartan 

## 2019-08-26 NOTE — Assessment & Plan Note (Signed)
Oxycodone   Potential benefits of a long term opioids use as well as potential risks (i.e. addiction risk, apnea etc) and complications (i.e. Somnolence, constipation and others) were explained to the patient and were aknowledged. 

## 2019-08-26 NOTE — Assessment & Plan Note (Signed)
Lipitor 

## 2019-08-26 NOTE — Progress Notes (Signed)
Subjective:  Patient ID: Cody Frazier, male    DOB: 03-16-1946  Age: 73 y.o. MRN: [id number]  CC: No chief complaint on file.   HPI Cody Frazier presents for LBP, CAD, HTN f/u   Outpatient Medications Prior to Visit  Medication Sig Dispense Refill  . acetaminophen (TYLENOL) 500 MG tablet Take 1,000 mg by mouth every 6 (six) hours as needed for mild pain or moderate pain.    Marland Kitchen atorvastatin (LIPITOR) 20 MG tablet TAKE 1 TABLET BY MOUTH EVERY DAY 90 tablet 3  . cetirizine (ZYRTEC) 10 MG tablet Take 10 mg by mouth daily.    Marland Kitchen diltiazem (CARDIZEM CD) 240 MG 24 hr capsule Take 1 capsule (240 mg total) by mouth daily. 90 capsule 3  . ELIQUIS 2.5 MG TABS tablet TAKE 1 TABLET BY MOUTH TWICE A DAY 60 tablet 11  . fluticasone (FLONASE) 50 MCG/ACT nasal spray SPRAY 2 SPRAYS INTO EACH NOSTRIL EVERY DAY 48 mL 1  . hydrochlorothiazide (MICROZIDE) 12.5 MG capsule Take 1 capsule (12.5 mg total) by mouth daily. 90 capsule 3  . latanoprost (XALATAN) 0.005 % ophthalmic solution Place 1 drop into both eyes at bedtime.  11  . metoprolol succinate (TOPROL-XL) 25 MG 24 hr tablet TAKE 1 TABLET BY MOUTH EVERY DAY 90 tablet 2  . Multiple Vitamins-Minerals (CENTRUM SILVER PO) Take by mouth daily.      Marland Kitchen oxyCODONE (ROXICODONE) 15 MG immediate release tablet Take 1 tablet (15 mg total) by mouth 4 (four) times daily as needed for pain. 120 tablet 0  . oxyCODONE (ROXICODONE) 15 MG immediate release tablet Take 1 tablet (15 mg total) by mouth 4 (four) times daily. 120 tablet 0  . oxyCODONE (ROXICODONE) 15 MG immediate release tablet Take 1 tablet (15 mg total) by mouth 4 (four) times daily. 120 tablet 0  . pantoprazole (PROTONIX) 40 MG tablet Take 1 tablet (40 mg total) by mouth 2 (two) times daily. 60 tablet 0  . polyethylene glycol (MIRALAX / GLYCOLAX) packet Take 17 g by mouth daily. (Patient taking differently: Take 17 g by mouth daily as needed for mild constipation. ) 14 each 0  . telmisartan (MICARDIS) 80  MG tablet TAKE 1 TABLET BY MOUTH EVERY DAY 90 tablet 2  . timolol (TIMOPTIC) 0.25 % ophthalmic solution INSTILL 1 DROP BY OPHTHALMIC ROUTE TWICE DAILY INTO BOTH EYES    . triamcinolone cream (KENALOG) 0.5 % Apply 1 application topically 3 (three) times daily. 90 g 1   No facility-administered medications prior to visit.     ROS: Review of Systems  Constitutional: Positive for unexpected weight change. Negative for appetite change and fatigue.  HENT: Negative for congestion, nosebleeds, sneezing, sore throat and trouble swallowing.   Eyes: Negative for itching and visual disturbance.  Respiratory: Negative for cough.   Cardiovascular: Negative for chest pain, palpitations and leg swelling.  Gastrointestinal: Negative for abdominal distention, blood in stool, diarrhea and nausea.  Genitourinary: Negative for frequency and hematuria.  Musculoskeletal: Positive for arthralgias, back pain and gait problem. Negative for joint swelling and neck pain.  Skin: Negative for rash.  Neurological: Negative for dizziness, tremors, speech difficulty and weakness.  Psychiatric/Behavioral: Negative for agitation, dysphoric mood and sleep disturbance. The patient is not nervous/anxious.     Objective:  BP 130/76 (BP Location: Left Arm, Patient Position: Sitting, Cuff Size: Large)   Pulse 77   Temp 98.5 F (36.9 C) (Oral)   Ht 5\' 6"  (1.676 m)   Wt 235  lb (106.6 kg)   SpO2 98%   BMI 37.93 kg/m   BP Readings from Last 3 Encounters:  08/26/19 130/76  08/13/19 (!) 152/90  06/17/19 130/72    Wt Readings from Last 3 Encounters:  08/26/19 235 lb (106.6 kg)  08/13/19 230 lb (104.3 kg)  06/17/19 230 lb (104.3 kg)    Physical Exam Constitutional:      General: He is not in acute distress.    Appearance: He is well-developed. He is obese.     Comments: NAD  Eyes:     Conjunctiva/sclera: Conjunctivae normal.     Pupils: Pupils are equal, round, and reactive to light.  Neck:     Musculoskeletal:  Normal range of motion.     Thyroid: No thyromegaly.     Vascular: No JVD.  Cardiovascular:     Rate and Rhythm: Normal rate and regular rhythm.     Heart sounds: Normal heart sounds. No murmur. No friction rub. No gallop.   Pulmonary:     Effort: Pulmonary effort is normal. No respiratory distress.     Breath sounds: Normal breath sounds. No wheezing or rales.  Chest:     Chest wall: No tenderness.  Abdominal:     General: Bowel sounds are normal. There is no distension.     Palpations: Abdomen is soft. There is no mass.     Tenderness: There is no abdominal tenderness. There is no guarding or rebound.  Musculoskeletal: Normal range of motion.        General: Tenderness present.     Right lower leg: Edema present.     Left lower leg: Edema present.  Lymphadenopathy:     Cervical: No cervical adenopathy.  Skin:    General: Skin is warm and dry.     Findings: No rash.  Neurological:     Mental Status: He is alert and oriented to person, place, and time.     Cranial Nerves: No cranial nerve deficit.     Motor: No abnormal muscle tone.     Coordination: Coordination normal.     Gait: Gait abnormal.     Deep Tendon Reflexes: Reflexes are normal and symmetric.  Psychiatric:        Behavior: Behavior normal.        Thought Content: Thought content normal.        Judgment: Judgment normal.    LS tender  Lab Results  Component Value Date   WBC 5.8 12/26/2018   HGB 13.3 12/26/2018   HCT 41.9 12/26/2018   PLT 307 12/26/2018   GLUCOSE 124 (H) 12/26/2018   CHOL 196 11/20/2018   TRIG 269.0 (H) 11/20/2018   HDL 43.60 11/20/2018   LDLDIRECT 109.0 11/20/2018   LDLCALC 86 11/25/2017   ALT 13 11/20/2018   AST 13 11/20/2018   NA 138 12/26/2018   K 4.3 12/26/2018   CL 104 12/26/2018   CREATININE 1.07 12/26/2018   BUN 15 12/26/2018   CO2 22 12/26/2018   TSH 1.22 11/20/2018   PSA 0.16 11/20/2018   INR 1.1 01/06/2019   HGBA1C 5.7 11/13/2016    Dg Foot Complete Right   Result Date: 06/17/2019 CLINICAL DATA:  Right foot deformity, new EXAM: RIGHT FOOT COMPLETE - 3+ VIEW COMPARISON:  None. FINDINGS: No evidence of acute fracture or traumatic malalignment. There is a hallux valgus deformity of the first ray. The second through fifth rays demonstrate clawtoe deformities. No acute fracture or traumatic malalignment. Circumferential soft tissue swelling is  present about the forefoot. No subcutaneous gas or foreign body. No osseous erosion or destructive changes. Mild degenerative changes are present in the midfoot though midfoot alignment is incompletely assessed on nonweightbearing radiographs. IMPRESSION: Circumferential soft tissue swelling about the forefoot. Hallux valgus deformity of the first ray. Clawtoe deformities of the second through fifth rays. Finding is non-specific but can be seen in the setting of synovitis. Electronically Signed   By: Lovena Le M.D.   On: 06/17/2019 22:05    Assessment & Plan:   There are no diagnoses linked to this encounter.   No orders of the defined types were placed in this encounter.    Follow-up: No follow-ups on file.  Walker Kehr, MD

## 2019-08-26 NOTE — Assessment & Plan Note (Signed)
Worse Fasting discussed

## 2019-08-26 NOTE — Patient Instructions (Signed)
If you have medicare related insurance (such as traditional Medicare, Blue Cross Medicare, United HealthCare Medicare, or similar), Please make an appointment at the scheduling desk with Jill, the Wellness Health Coach, for your Wellness visit in this office, which is a benefit with your insurance.    These suggestions will probably help you to improve your metabolism if you are not overweight and to lose weight if you are overweight: 1.  Reduce your consumption of sugars and starches.  Eliminate high fructose corn syrup from your diet.  Reduce your consumption of processed foods.  For desserts try to have seasonal fruits, berries, nuts, cheeses or dark chocolate with more than 70% cacao. 2.  Do not snack 3.  You do not have to eat breakfast.  If you choose to have breakfast-eat plain greek yogurt, eggs, oatmeal (without sugar) 4.  Drink water, freshly brewed unsweetened tea (green, black or herbal) or coffee.  Do not drink sodas including diet sodas , juices, beverages sweetened with artificial sweeteners. 5.  Reduce your consumption of refined grains. 6.  Avoid protein drinks such as Optifast, Slim fast etc. Eat chicken, fish, meat, dairy and beans for your sources of protein 7.  Natural unprocessed fats like cold pressed virgin olive oil, butter, coconut oil are good for you.  Eat avocados 8.  Increase your consumption of fiber.  Fruits, berries, vegetables, whole grains, flaxseeds, Chia seeds, beans, popcorn, nuts, oatmeal are good sources of fiber 9.  Use vinegar in your diet, i.e. apple cider vinegar, red wine or balsamic vinegar 10.  You can try fasting.  For example you can skip breakfast and lunch every other day (24-hour fast) 11.  Stress reduction, good night sleep, relaxation, meditation, yoga and other physical activity is likely to help you to maintain low weight too. 12.  If you drink alcohol, limit your alcohol intake to no more than 2 drinks a day.    Cabbage soup recipe that  will not make you gain weight: Take 1 small head of cabbage, 1 average pack of celery, 4 green peppers, 4 onions, 2 cans diced tomatoes (they are not available without salt), salt and spices to taste.  Chop cabbage, celery, peppers and onions.  And tomatoes and 2-2.5 liters (2.5 quarts) of water so that it would just cover the vegetables.  Bring to boil.  Add spices and salt.  Turn heat to low/medium and simmer for 20-25 minutes.  Naturally, you can make a smaller batch and change some of the ingredients.  

## 2019-08-27 ENCOUNTER — Ambulatory Visit: Payer: Medicare Other | Admitting: Internal Medicine

## 2019-09-07 ENCOUNTER — Other Ambulatory Visit: Payer: Self-pay

## 2019-09-07 DIAGNOSIS — K2270 Barrett's esophagus without dysplasia: Secondary | ICD-10-CM

## 2019-09-07 DIAGNOSIS — D508 Other iron deficiency anemias: Secondary | ICD-10-CM

## 2019-09-07 MED ORDER — PANTOPRAZOLE SODIUM 40 MG PO TBEC: 40.0000 mg | DELAYED_RELEASE_TABLET | Freq: Two times a day (BID) | ORAL | 0 refills | Status: DC

## 2019-09-08 ENCOUNTER — Other Ambulatory Visit: Payer: Self-pay

## 2019-09-08 ENCOUNTER — Ambulatory Visit (INDEPENDENT_AMBULATORY_CARE_PROVIDER_SITE_OTHER): Payer: Medicare Other | Admitting: Internal Medicine

## 2019-09-08 ENCOUNTER — Encounter: Payer: Self-pay | Admitting: Internal Medicine

## 2019-09-08 DIAGNOSIS — E559 Vitamin D deficiency, unspecified: Secondary | ICD-10-CM | POA: Diagnosis not present

## 2019-09-08 DIAGNOSIS — H6123 Impacted cerumen, bilateral: Secondary | ICD-10-CM | POA: Diagnosis not present

## 2019-09-08 MED ORDER — VITAMIN D3 1.25 MG (50000 UT) PO CAPS: 1.0000 | ORAL_CAPSULE | ORAL | 3 refills | Status: DC

## 2019-09-08 NOTE — Assessment & Plan Note (Signed)
Vit D 50000 monthly

## 2019-09-08 NOTE — Progress Notes (Signed)
Subjective:  Patient ID: Cody Frazier, male    DOB: Aug 24, 1946  Age: 73 y.o. MRN: [id number]  CC: No chief complaint on file.   HPI Cody Frazier presents for R ear stopped up x few days Not taking Vit D  Outpatient Medications Prior to Visit  Medication Sig Dispense Refill  . acetaminophen (TYLENOL) 500 MG tablet Take 1,000 mg by mouth every 6 (six) hours as needed for mild pain or moderate pain.    [facility] atorvastatin (LIPITOR) 20 MG tablet TAKE 1 TABLET BY MOUTH EVERY DAY 90 tablet 3  . cetirizine (ZYRTEC) 10 MG tablet Take 10 mg by mouth daily.    [facility] diltiazem (CARDIZEM CD) 240 MG 24 hr capsule Take 1 capsule (240 mg total) by mouth daily. 90 capsule 3  . ELIQUIS 2.5 MG TABS tablet TAKE 1 TABLET BY MOUTH TWICE A DAY 60 tablet 11  . fluticasone (FLONASE) 50 MCG/ACT nasal spray SPRAY 2 SPRAYS INTO EACH NOSTRIL EVERY DAY 48 mL 1  . hydrochlorothiazide (MICROZIDE) 12.5 MG capsule Take 1 capsule (12.5 mg total) by mouth daily. 90 capsule 3  . latanoprost (XALATAN) 0.005 % ophthalmic solution Place 1 drop into both eyes at bedtime.  11  . metoprolol succinate (TOPROL-XL) 25 MG 24 hr tablet TAKE 1 TABLET BY MOUTH EVERY DAY 90 tablet 2  . Multiple Vitamins-Minerals (CENTRUM SILVER PO) Take by mouth daily.      [facility] oxyCODONE (ROXICODONE) 15 MG immediate release tablet Take 1 tablet (15 mg total) by mouth 4 (four) times daily as needed for pain. 120 tablet 0  . oxyCODONE (ROXICODONE) 15 MG immediate release tablet Take 1 tablet (15 mg total) by mouth 4 (four) times daily. 120 tablet 0  . oxyCODONE (ROXICODONE) 15 MG immediate release tablet Take 1 tablet (15 mg total) by mouth 4 (four) times daily. 120 tablet 0  . pantoprazole (PROTONIX) 40 MG tablet Take 1 tablet (40 mg total) by mouth 2 (two) times daily. 60 tablet 0  . polyethylene glycol (MIRALAX / GLYCOLAX) packet Take 17 g by mouth daily. (Patient taking differently: Take 17 g by mouth daily as needed for mild constipation. ) 14 each 0  .  telmisartan (MICARDIS) 80 MG tablet TAKE 1 TABLET BY MOUTH EVERY DAY 90 tablet 2  . timolol (TIMOPTIC) 0.25 % ophthalmic solution INSTILL 1 DROP BY OPHTHALMIC ROUTE TWICE DAILY INTO BOTH EYES    . triamcinolone cream (KENALOG) 0.5 % Apply 1 application topically 3 (three) times daily. 90 g 1   No facility-administered medications prior to visit.     ROS: Review of Systems  Constitutional: Negative for fever.  HENT: Positive for hearing loss. Negative for dental problem, ear pain and facial swelling.   Musculoskeletal: Positive for back pain.    Objective:  BP 128/76 (BP Location: Left Arm, Patient Position: Sitting, Cuff Size: Large)   Pulse 69   Temp 98.4 F (36.9 C) (Oral)   Ht 5\' 6"  (1.676 m)   Wt 235 lb (106.6 kg)   SpO2 97%   BMI 37.93 kg/m   BP Readings from Last 3 Encounters:  09/08/19 128/76  08/26/19 130/76  08/13/19 (!) 152/90    Wt Readings from Last 3 Encounters:  09/08/19 235 lb (106.6 kg)  08/26/19 235 lb (106.6 kg)  08/13/19 230 lb (104.3 kg)    Physical Exam Constitutional:      Appearance: He is obese.  HENT:     Right Ear: There is impacted cerumen.  Left Ear: There is impacted cerumen.     Nose: Nose normal.  Musculoskeletal:        General: Tenderness present.  Neurological:     Mental Status: He is alert.   wax R>L   Procedure Note :     Procedure :  Ear irrigation right and left ears   Indication:  Cerumen impaction right and left ears   Risks, including pain, dizziness, eardrum perforation, bleeding, infection and others as well as benefits were explained to the patient in detail. Verbal consent was obtained and the patient agreed to proceed.    We used "The Elephant Ear Irrigation Device" filled with lukewarm water for irrigation. A large amount wax was recovered from both ears. Procedure has also required manual wax removal/instrumentation with an ear wax curette and ear forceps on the right and left ears.   Tolerated well.  Complications: None.   Postprocedure instructions :  Call if problems.    Lab Results  Component Value Date   WBC 5.8 12/26/2018   HGB 13.3 12/26/2018   HCT 41.9 12/26/2018   PLT 307 12/26/2018   GLUCOSE 110 (H) 08/26/2019   CHOL 196 11/20/2018   TRIG 269.0 (H) 11/20/2018   HDL 43.60 11/20/2018   LDLDIRECT 109.0 11/20/2018   LDLCALC 86 11/25/2017   ALT 11 08/26/2019   AST 10 08/26/2019   NA 138 08/26/2019   K 4.3 08/26/2019   CL 101 08/26/2019   CREATININE 1.26 08/26/2019   BUN 20 08/26/2019   CO2 26 08/26/2019   TSH 1.55 08/26/2019   PSA 0.16 11/20/2018   INR 1.1 01/06/2019   HGBA1C 6.0 08/26/2019    Dg Foot Complete Right  Result Date: 06/17/2019 CLINICAL DATA:  Right foot deformity, new EXAM: RIGHT FOOT COMPLETE - 3+ VIEW COMPARISON:  None. FINDINGS: No evidence of acute fracture or traumatic malalignment. There is a hallux valgus deformity of the first ray. The second through fifth rays demonstrate clawtoe deformities. No acute fracture or traumatic malalignment. Circumferential soft tissue swelling is present about the forefoot. No subcutaneous gas or foreign body. No osseous erosion or destructive changes. Mild degenerative changes are present in the midfoot though midfoot alignment is incompletely assessed on nonweightbearing radiographs. IMPRESSION: Circumferential soft tissue swelling about the forefoot. Hallux valgus deformity of the first ray. Clawtoe deformities of the second through fifth rays. Finding is non-specific but can be seen in the setting of synovitis. Electronically Signed   By: Lovena Le M.D.   On: 06/17/2019 22:05    Assessment & Plan:   There are no diagnoses linked to this encounter.   No orders of the defined types were placed in this encounter.    Follow-up: No follow-ups on file.  Walker Kehr, MD

## 2019-09-08 NOTE — Assessment & Plan Note (Signed)
R>L See procedure

## 2019-10-05 ENCOUNTER — Other Ambulatory Visit: Payer: Self-pay

## 2019-10-05 DIAGNOSIS — K2270 Barrett's esophagus without dysplasia: Secondary | ICD-10-CM

## 2019-10-05 DIAGNOSIS — D508 Other iron deficiency anemias: Secondary | ICD-10-CM

## 2019-10-05 MED ORDER — PANTOPRAZOLE SODIUM 40 MG PO TBEC: 40.0000 mg | DELAYED_RELEASE_TABLET | Freq: Two times a day (BID) | ORAL | 0 refills | Status: DC

## 2019-10-24 ENCOUNTER — Other Ambulatory Visit: Payer: Self-pay | Admitting: Internal Medicine

## 2019-11-10 ENCOUNTER — Other Ambulatory Visit: Payer: Self-pay

## 2019-11-10 DIAGNOSIS — D508 Other iron deficiency anemias: Secondary | ICD-10-CM

## 2019-11-10 DIAGNOSIS — K2270 Barrett's esophagus without dysplasia: Secondary | ICD-10-CM

## 2019-11-10 MED ORDER — PANTOPRAZOLE SODIUM 40 MG PO TBEC: 40.0000 mg | DELAYED_RELEASE_TABLET | Freq: Two times a day (BID) | ORAL | 1 refills | Status: DC

## 2019-11-22 ENCOUNTER — Other Ambulatory Visit: Payer: Self-pay | Admitting: Cardiovascular Disease

## 2019-11-24 ENCOUNTER — Other Ambulatory Visit: Payer: Self-pay

## 2019-11-24 ENCOUNTER — Ambulatory Visit (INDEPENDENT_AMBULATORY_CARE_PROVIDER_SITE_OTHER): Payer: Medicare Other | Admitting: Internal Medicine

## 2019-11-24 ENCOUNTER — Encounter: Payer: Self-pay | Admitting: Internal Medicine

## 2019-11-24 DIAGNOSIS — Z6835 Body mass index (BMI) 35.0-35.9, adult: Secondary | ICD-10-CM

## 2019-11-24 DIAGNOSIS — E559 Vitamin D deficiency, unspecified: Secondary | ICD-10-CM

## 2019-11-24 DIAGNOSIS — E785 Hyperlipidemia, unspecified: Secondary | ICD-10-CM | POA: Diagnosis not present

## 2019-11-24 MED ORDER — OXYCODONE HCL 15 MG PO TABS: 15.0000 mg | ORAL_TABLET | Freq: Four times a day (QID) | ORAL | 0 refills | Status: DC | PRN

## 2019-11-24 MED ORDER — OXYCODONE HCL 15 MG PO TABS: 15.0000 mg | ORAL_TABLET | Freq: Four times a day (QID) | ORAL | 0 refills | Status: DC

## 2019-11-24 NOTE — Assessment & Plan Note (Signed)
Worse Try fasting

## 2019-11-24 NOTE — Progress Notes (Signed)
Subjective:  Patient ID: Cody Frazier, male    DOB: March 10, 1946  Age: 74 y.o. MRN: [id number]  CC: No chief complaint on file.   HPI BOEN MALATESTA presents for wt gain, HTN, LBP f/u  Outpatient Medications Prior to Visit  Medication Sig Dispense Refill  . acetaminophen (TYLENOL) 500 MG tablet Take 1,000 mg by mouth every 6 (six) hours as needed for mild pain or moderate pain.    [facility] Kitchen atorvastatin (LIPITOR) 20 MG tablet TAKE 1 TABLET BY MOUTH EVERY DAY 90 tablet 3  . cetirizine (ZYRTEC) 10 MG tablet Take 10 mg by mouth daily.    . Cholecalciferol (VITAMIN D3) 1.25 MG (50000 UT) CAPS Take 1 capsule by mouth every 30 (thirty) days. 3 capsule 3  . diltiazem (CARDIZEM CD) 240 MG 24 hr capsule Take 1 capsule (240 mg total) by mouth daily. 90 capsule 3  . ELIQUIS 2.5 MG TABS tablet TAKE 1 TABLET BY MOUTH TWICE A DAY 60 tablet 11  . fluticasone (FLONASE) 50 MCG/ACT nasal spray SPRAY 2 SPRAYS INTO EACH NOSTRIL EVERY DAY 48 mL 5  . latanoprost (XALATAN) 0.005 % ophthalmic solution Place 1 drop into both eyes at bedtime.  11  . metoprolol succinate (TOPROL-XL) 25 MG 24 hr tablet TAKE 1 TABLET BY MOUTH EVERY DAY 90 tablet 2  . Multiple Vitamins-Minerals (CENTRUM SILVER PO) Take by mouth daily.      Marland Kitchen oxyCODONE (ROXICODONE) 15 MG immediate release tablet Take 1 tablet (15 mg total) by mouth 4 (four) times daily as needed for pain. 120 tablet 0  . oxyCODONE (ROXICODONE) 15 MG immediate release tablet Take 1 tablet (15 mg total) by mouth 4 (four) times daily. 120 tablet 0  . oxyCODONE (ROXICODONE) 15 MG immediate release tablet Take 1 tablet (15 mg total) by mouth 4 (four) times daily. 120 tablet 0  . pantoprazole (PROTONIX) 40 MG tablet Take 1 tablet (40 mg total) by mouth 2 (two) times daily. 60 tablet 1  . polyethylene glycol (MIRALAX / GLYCOLAX) packet Take 17 g by mouth daily. (Patient taking differently: Take 17 g by mouth daily as needed for mild constipation. ) 14 each 0  . telmisartan  (MICARDIS) 80 MG tablet TAKE 1 TABLET BY MOUTH EVERY DAY 90 tablet 3  . timolol (TIMOPTIC) 0.25 % ophthalmic solution INSTILL 1 DROP BY OPHTHALMIC ROUTE TWICE DAILY INTO BOTH EYES    . triamcinolone cream (KENALOG) 0.5 % Apply 1 application topically 3 (three) times daily. 90 g 1  . hydrochlorothiazide (MICROZIDE) 12.5 MG capsule Take 1 capsule (12.5 mg total) by mouth daily. 90 capsule 3   No facility-administered medications prior to visit.    ROS: Review of Systems  Constitutional: Positive for fatigue. Negative for appetite change and unexpected weight change.  HENT: Negative for congestion, nosebleeds, sneezing, sore throat and trouble swallowing.   Eyes: Negative for itching and visual disturbance.  Respiratory: Negative for cough.   Cardiovascular: Negative for chest pain, palpitations and leg swelling.  Gastrointestinal: Negative for abdominal distention, blood in stool, diarrhea and nausea.  Genitourinary: Negative for frequency and hematuria.  Musculoskeletal: Positive for arthralgias, back pain and gait problem. Negative for joint swelling and neck pain.  Skin: Negative for rash.  Neurological: Negative for dizziness, tremors, speech difficulty and weakness.  Psychiatric/Behavioral: Negative for agitation, dysphoric mood, sleep disturbance and suicidal ideas. The patient is not nervous/anxious.     Objective:  BP 138/84 (BP Location: Left Arm, Patient Position: Sitting, Cuff Size: Large)  Pulse 81   Temp 98.7 F (37.1 C) (Oral)   Ht 5\' 6"  (1.676 m)   Wt 242 lb (109.8 kg)   SpO2 97%   BMI 39.06 kg/m   BP Readings from Last 3 Encounters:  11/24/19 138/84  09/08/19 128/76  08/26/19 130/76    Wt Readings from Last 3 Encounters:  11/24/19 242 lb (109.8 kg)  09/08/19 235 lb (106.6 kg)  08/26/19 235 lb (106.6 kg)    Physical Exam Constitutional:      General: He is not in acute distress.    Appearance: He is well-developed. He is obese.     Comments: NAD  Eyes:      Conjunctiva/sclera: Conjunctivae normal.     Pupils: Pupils are equal, round, and reactive to light.  Neck:     Thyroid: No thyromegaly.     Vascular: No JVD.  Cardiovascular:     Rate and Rhythm: Normal rate and regular rhythm.     Heart sounds: Normal heart sounds. No murmur. No friction rub. No gallop.   Pulmonary:     Effort: Pulmonary effort is normal. No respiratory distress.     Breath sounds: Normal breath sounds. No wheezing or rales.  Chest:     Chest wall: No tenderness.  Abdominal:     General: Bowel sounds are normal. There is no distension.     Palpations: Abdomen is soft. There is no mass.     Tenderness: There is no abdominal tenderness. There is no guarding or rebound.  Musculoskeletal:        General: No tenderness. Normal range of motion.     Cervical back: Normal range of motion.  Lymphadenopathy:     Cervical: No cervical adenopathy.  Skin:    General: Skin is warm and dry.     Findings: No rash.  Neurological:     Mental Status: He is alert and oriented to person, place, and time.     Cranial Nerves: No cranial nerve deficit.     Motor: No abnormal muscle tone.     Coordination: Coordination normal.     Gait: Gait abnormal.     Deep Tendon Reflexes: Reflexes are normal and symmetric.  Psychiatric:        Behavior: Behavior normal.        Thought Content: Thought content normal.        Judgment: Judgment normal.   obese LS tender  Lab Results  Component Value Date   WBC 5.8 12/26/2018   HGB 13.3 12/26/2018   HCT 41.9 12/26/2018   PLT 307 12/26/2018   GLUCOSE 110 (H) 08/26/2019   CHOL 196 11/20/2018   TRIG 269.0 (H) 11/20/2018   HDL 43.60 11/20/2018   LDLDIRECT 109.0 11/20/2018   LDLCALC 86 11/25/2017   ALT 11 08/26/2019   AST 10 08/26/2019   NA 138 08/26/2019   K 4.3 08/26/2019   CL 101 08/26/2019   CREATININE 1.26 08/26/2019   BUN 20 08/26/2019   CO2 26 08/26/2019   TSH 1.55 08/26/2019   PSA 0.16 11/20/2018   INR 1.1 01/06/2019     HGBA1C 6.0 08/26/2019    DG Foot Complete Right  Result Date: 06/17/2019 CLINICAL DATA:  Right foot deformity, new EXAM: RIGHT FOOT COMPLETE - 3+ VIEW COMPARISON:  None. FINDINGS: No evidence of acute fracture or traumatic malalignment. There is a hallux valgus deformity of the first ray. The second through fifth rays demonstrate clawtoe deformities. No acute fracture or traumatic malalignment. Circumferential  soft tissue swelling is present about the forefoot. No subcutaneous gas or foreign body. No osseous erosion or destructive changes. Mild degenerative changes are present in the midfoot though midfoot alignment is incompletely assessed on nonweightbearing radiographs. IMPRESSION: Circumferential soft tissue swelling about the forefoot. Hallux valgus deformity of the first ray. Clawtoe deformities of the second through fifth rays. Finding is non-specific but can be seen in the setting of synovitis. Electronically Signed   By: Lovena Le M.D.   On: 06/17/2019 22:05    Assessment & Plan:   There are no diagnoses linked to this encounter.   No orders of the defined types were placed in this encounter.    Follow-up: No follow-ups on file.  Walker Kehr, MD

## 2019-11-24 NOTE — Assessment & Plan Note (Signed)
Vit D 50000 monthly

## 2019-11-24 NOTE — Patient Instructions (Signed)
These suggestions will probably help you to improve your metabolism if you are not overweight and to lose weight if you are overweight: 1.  Reduce your consumption of sugars and starches.  Eliminate high fructose corn syrup from your diet.  Reduce your consumption of processed foods.  For desserts try to have seasonal fruits, berries, nuts, cheeses or dark chocolate with more than 70% cacao. 2.  Do not snack 3.  You do not have to eat breakfast.  If you choose to have breakfast-eat plain greek yogurt, eggs, oatmeal (without sugar) 4.  Drink water, freshly brewed unsweetened tea (green, black or herbal) or coffee.  Do not drink sodas including diet sodas , juices, beverages sweetened with artificial sweeteners. 5.  Reduce your consumption of refined grains. 6.  Avoid protein drinks such as Optifast, Slim fast etc. Eat chicken, fish, meat, dairy and beans for your sources of protein 7.  Natural unprocessed fats like cold pressed virgin olive oil, butter, coconut oil are good for you.  Eat avocados 8.  Increase your consumption of fiber.  Fruits, berries, vegetables, whole grains, flaxseeds, Chia seeds, beans, popcorn, nuts, oatmeal are good sources of fiber 9.  Use vinegar in your diet, i.e. apple cider vinegar, red wine or balsamic vinegar 10.  You can try fasting.  For example you can skip breakfast and lunch every other day (24-hour fast) 11.  Stress reduction, good night sleep, relaxation, meditation, yoga and other physical activity is likely to help you to maintain low weight too. 12.  If you drink alcohol, limit your alcohol intake to no more than 2 drinks a day.      We are committed to keeping you informed about the COVID-19 vaccine.  As the vaccine continues to become available for each phase, we will ensure that patients who meet the criteria receive the information they need to access vaccination opportunities. Continue to check your MyChart account and [email or web address] for  updates. Please review the Phase 1b information below.  Following [place]'s guidelines for the distribution of COVID-19 vaccines, we are pleased to share our plans to begin offering vaccines to those 75 and older (Phase 1b). Here are details of those plans:  [facility] . Appointments required. [facility] Open to those age 61 or older . Not restricted to [facility] or [facility] residents . Location: [place], [place], [place]  . Offered daily beginning: Saturday, November 21, 2019 . Hours: 8 a.m. to 1 p.m. (10 a.m. to 2 p.m. this Saturday and Sunday, Jan. 9 and 10) . Registration for vaccine clinic appointments is open as of 10 a.m., Friday, January 8 . Please visit [email or web address] to register or call [phone] . There will be no copay required for the vaccine. Insurance information will be requested if available.  [place] will offer this vaccination clinic through Sunday, January 17, after which we will switch to a larger location to offer public vaccination at a larger scale following state guidelines. We will provide additional information as details become available.   Also, people who are 75 years and older can sign up to get vaccinated against COVID-19 at clinics that begin Monday through the [facility]. The vaccinations are open to all in that age group, regardless of their health condition or living situation.  Appointments are required and can be made beginning at 8 a.m. Friday by calling [phone] and selecting option 2. Walk-ins will not be accepted. Clinic  locations are: [facility], [place]; [facility] [address], [place]; . [place] at [facility], [id number], [place].  Participants are asked to wear a face covering at  vaccination sites. Visit [email or web address] and click on the "[id number] Vaccine Info" rectangle for more information about vaccinations.  In addition to the clinics above, we are working in partnership with county health agencies in [place], [place], [place] and [place] counties to ensure continuing vaccination availability in alignment with state guidelines in the weeks and months ahead.   Information on phase 1b COVID-19 vaccination clinics being offered by local county health agencies is provided in the website links below for your convenience:   . [place]  . [place]  . [place] . [place]  . [place]'s phase 1b vaccination guidelines, prioritizing those 75 and over as the next eligible group to receive the COVID-19 vaccine, are detailed at [email or web address].   Additional information: Those 75 and older who register for [place]'s COVID-19 vaccination clinic at [facility] will be provided with additional information, including the need to be observed for 15 minutes following vaccination for your safety. Our COVID-19 testing clinic will not start at this site until 2 p.m. daily to ensure no people arriving for vaccination intersect with those seeking a COVID-19 test. [place] rooms at the [facility] are clean and safe, and our staff will use all appropriate protective equipment to keep you safe. This vaccine clinic will be located in a completely separate area of this facility than where health care is provided. No interaction will occur between patients or care teams at this site and our vaccination clinic.   As we proceed through phases of the vaccine rollout, we remind everyone to remain vigilant in practicing the 3 W's - wear a mask, wash your hands and wait 6 feet apart from others. These safety practices are based in science and are the best tool we have to reduce the spread of the virus.   For  our most current information, please visit [email or web address].

## 2019-11-24 NOTE — Assessment & Plan Note (Signed)
Lipitor 

## 2019-11-26 ENCOUNTER — Ambulatory Visit: Payer: Medicare Other | Admitting: Internal Medicine

## 2019-12-13 ENCOUNTER — Ambulatory Visit: Payer: Medicare Other

## 2019-12-15 ENCOUNTER — Other Ambulatory Visit (INDEPENDENT_AMBULATORY_CARE_PROVIDER_SITE_OTHER): Payer: Medicare Other

## 2019-12-15 ENCOUNTER — Encounter: Payer: Self-pay | Admitting: Gastroenterology

## 2019-12-15 ENCOUNTER — Ambulatory Visit (INDEPENDENT_AMBULATORY_CARE_PROVIDER_SITE_OTHER): Payer: Medicare Other | Admitting: Gastroenterology

## 2019-12-15 VITALS — BP 114/70 | HR 84 | Temp 97.8°F | Ht 66.0 in | Wt 245.4 lb

## 2019-12-15 DIAGNOSIS — D508 Other iron deficiency anemias: Secondary | ICD-10-CM

## 2019-12-15 DIAGNOSIS — Z7901 Long term (current) use of anticoagulants: Secondary | ICD-10-CM

## 2019-12-15 DIAGNOSIS — K2270 Barrett's esophagus without dysplasia: Secondary | ICD-10-CM

## 2019-12-15 DIAGNOSIS — Z86010 Personal history of colonic polyps: Secondary | ICD-10-CM

## 2019-12-15 LAB — CBC WITH DIFFERENTIAL/PLATELET
Basophils Absolute: 0 10*3/uL (ref 0.0–0.1)
Basophils Relative: 0.4 % (ref 0.0–3.0)
Eosinophils Absolute: 0.1 10*3/uL (ref 0.0–0.7)
Eosinophils Relative: 1.9 % (ref 0.0–5.0)
HCT: 42.1 % (ref 39.0–52.0)
Hemoglobin: 14 g/dL (ref 13.0–17.0)
Lymphocytes Relative: 33.4 % (ref 12.0–46.0)
Lymphs Abs: 2.3 10*3/uL (ref 0.7–4.0)
MCHC: 33.3 g/dL (ref 30.0–36.0)
MCV: 93.3 fl (ref 78.0–100.0)
Monocytes Absolute: 0.7 10*3/uL (ref 0.1–1.0)
Monocytes Relative: 10.3 % (ref 3.0–12.0)
Neutro Abs: 3.7 10*3/uL (ref 1.4–7.7)
Neutrophils Relative %: 54 % (ref 43.0–77.0)
Platelets: 320 10*3/uL (ref 150.0–400.0)
RBC: 4.51 Mil/uL (ref 4.22–5.81)
RDW: 12.8 % (ref 11.5–15.5)
WBC: 6.8 10*3/uL (ref 4.0–10.5)

## 2019-12-15 MED ORDER — PANTOPRAZOLE SODIUM 40 MG PO TBEC: 40.0000 mg | DELAYED_RELEASE_TABLET | Freq: Two times a day (BID) | ORAL | 11 refills | Status: DC

## 2019-12-15 MED ORDER — FAMOTIDINE 40 MG PO TABS: 40.0000 mg | ORAL_TABLET | Freq: Every day | ORAL | 11 refills | Status: DC

## 2019-12-15 NOTE — Patient Instructions (Signed)
Your provider has requested that you go to the basement level for lab work before leaving today. Press "B" on the elevator. The lab is located at the first door on the left as you exit the elevator.  We have sent the following medications to your pharmacy for you to pick up at your convenience: pantoprazole and famotidine.   Normal BMI (Body Mass Index- based on height and weight) is between 23 and 30. Your BMI today is Body mass index is 39.61 kg/m. [facility] Please consider follow up  regarding your BMI with your Primary Care Provider.  Due to recent changes in healthcare laws, you may see the results of your imaging and laboratory studies on MyChart before your provider has had a chance to review them.  We understand that in some cases there may be results that are confusing or concerning to you. Not all laboratory results come back in the same time frame and the provider may be waiting for multiple results in order to interpret others.  Please give [place] 48 hours in order for your provider to thoroughly review all the results before contacting the office for clarification of your results.   Thank you for choosing me and [facility].  Pricilla Riffle. Dagoberto Ligas., MD., [facility]

## 2019-12-15 NOTE — Progress Notes (Signed)
    History of Present Illness: This is a 74 year old male with a history of Barrett's esophagus, history of bleeding gastric ulcers and a strong family history of colon cancer.  He was evaluated for iron deficiency anemia in 2018.  Colonoscopy in May 2018 showed diverticulosis, 1 small colon polyp (tubular adenoma) and internal hemorrhoids.  EGD in May 2018 showed long segment Barrett's esophagus without dysplasia prior fundoplication with a loose wrap and gastritis (reactive gastropathy).  He relates frequent breakthrough reflux symptoms and states he does not follow antireflux diet closely.  He relates problems with deep cough for the past 7 to 8 months that he feels within his chest.  It is nonproductive.  Current Medications, Allergies, Past Medical History, Past Surgical History, Family History and Social History were reviewed in [employer] record.   Physical Exam: General: Well developed, well nourished, no acute distress Head: Normocephalic and atraumatic Eyes:  sclerae anicteric, EOMI Ears: Normal auditory acuity Mouth: No deformity or lesions Lungs: Clear throughout to auscultation Heart: Regular rate and rhythm; no murmurs, rubs or bruits Abdomen: Soft, non tender and non distended. No masses, hepatosplenomegaly or hernias noted. Normal Bowel sounds Rectal: Not done Musculoskeletal: Symmetrical with no gross deformities  Pulses:  Normal pulses noted Extremities: No clubbing, cyanosis, edema or deformities noted Neurological: Alert oriented x 4, grossly nonfocal Psychological:  Alert and cooperative. Normal mood and affect   Assessment and Recommendations:  1.  GERD.  Long segment Barrett's esophagus without dysplasia.  Cough could be related to LPR or another cause.  Intensify antireflux measures.  Continue pantoprazole 40 mg twice daily.  Begin famotidine 40 mg at bedtime.  Surveillance EGD recommended at a 3-year interval in May 2021. The risks  (including bleeding, perforation, infection, missed lesions, medication reactions and possible hospitalization or surgery if complications occur), benefits, and alternatives to endoscopy with possible biopsy and possible dilation were discussed with the patient and they consent to proceed.  If cough persists despite adding famotidine and intensifying antireflux measures for several weeks he is advised to seek further evaluation with his PCP.  2.  Personal history of SSA and strong family history of colon cancer.  A 5-year interval surveillance colonoscopy is recommended in May 2023.  3. PAF.  Maintained on Eliquis. Hold Eliqus days before procedure - will instruct when and how to resume after procedure. Low but real risk of cardiovascular event such as heart attack, stroke, embolism, thrombosis or ischemia/infarct of other organs off Eliquis explained and need to seek urgent help if this occurs. The patient consents to proceed. Will communicate by phone or EMR with patient's prescribing provider to confirm that holding Eliquis is reasonable in this case.   4.  History of iron deficiency anemia. CBC today - Hgb=14.0.

## 2019-12-18 ENCOUNTER — Ambulatory Visit: Payer: Medicare Other

## 2019-12-24 ENCOUNTER — Ambulatory Visit: Payer: Medicare Other

## 2020-01-15 ENCOUNTER — Telehealth: Payer: Self-pay | Admitting: Radiology

## 2020-01-15 ENCOUNTER — Encounter: Payer: Self-pay | Admitting: Cardiovascular Disease

## 2020-01-15 ENCOUNTER — Other Ambulatory Visit: Payer: Self-pay

## 2020-01-15 ENCOUNTER — Ambulatory Visit (INDEPENDENT_AMBULATORY_CARE_PROVIDER_SITE_OTHER): Payer: Medicare Other | Admitting: Cardiovascular Disease

## 2020-01-15 VITALS — BP 121/73 | HR 65 | Temp 97.5°F | Ht 66.0 in | Wt 244.8 lb

## 2020-01-15 DIAGNOSIS — Z7901 Long term (current) use of anticoagulants: Secondary | ICD-10-CM

## 2020-01-15 DIAGNOSIS — E782 Mixed hyperlipidemia: Secondary | ICD-10-CM

## 2020-01-15 DIAGNOSIS — I272 Pulmonary hypertension, unspecified: Secondary | ICD-10-CM

## 2020-01-15 DIAGNOSIS — Z9989 Dependence on other enabling machines and devices: Secondary | ICD-10-CM | POA: Diagnosis not present

## 2020-01-15 DIAGNOSIS — I10 Essential (primary) hypertension: Secondary | ICD-10-CM | POA: Diagnosis not present

## 2020-01-15 DIAGNOSIS — I700 Atherosclerosis of aorta: Secondary | ICD-10-CM | POA: Diagnosis not present

## 2020-01-15 DIAGNOSIS — G4733 Obstructive sleep apnea (adult) (pediatric): Secondary | ICD-10-CM

## 2020-01-15 DIAGNOSIS — I480 Paroxysmal atrial fibrillation: Secondary | ICD-10-CM | POA: Diagnosis not present

## 2020-01-15 MED ORDER — TELMISARTAN 40 MG PO TABS: 40.0000 mg | ORAL_TABLET | Freq: Every day | ORAL | 1 refills | Status: DC
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
## 2020-01-15 NOTE — Telephone Encounter (Signed)
Enrolled patient for a 7 day Zio monitor to be mailed to patients home.  

## 2020-01-15 NOTE — Patient Instructions (Signed)
Medication Instructions:  DECREASE the Telmisartan to 40 mg once daily  *If you need a refill on your cardiac medications before your next appointment, please call your pharmacy*   Lab Work: None ordered If you have labs (blood work) drawn today and your tests are completely normal, you will receive your results only by: Marland Kitchen MyChart Message (if you have MyChart) OR . A paper copy in the mail If you have any lab test that is abnormal or we need to change your treatment, we will call you to review the results.   Testing/Procedures: Bryn Gulling- Long Term Monitor Instructions   Your physician has requested you wear your ZIO patch monitor_7_days.   This is a single patch monitor.  [employer] supplies one patch monitor per enrollment.  Additional stickers are not available.   Please do not apply patch if you will be having a Nuclear Stress Test, Echocardiogram, Cardiac CT, MRI, or Chest Xray during the time frame you would be wearing the monitor. The patch cannot be worn during these tests.  You cannot remove and re-apply the ZIO XT patch monitor.   Your ZIO patch monitor will be sent USPS Priority mail from [facility] directly to your home address. The monitor may also be mailed to a PO BOX if home delivery is not available.   It may take 3-5 days to receive your monitor after you have been enrolled.   Once you have received you monitor, please review enclosed instructions.  Your monitor has already been registered assigning a specific monitor serial # to you.   Applying the monitor   Shave hair from upper left chest.   Hold abrader disc by orange tab.  Rub abrader in 40 strokes over left upper chest as indicated in your monitor instructions.   Clean area with 4 enclosed alcohol pads .  Use all pads to assure are is cleaned thoroughly.  Let dry.   Apply patch as indicated in monitor instructions.  Patch will be place under collarbone on left side of chest with arrow pointing upward.    Rub patch adhesive wings for 2 minutes.Remove white label marked "1".  Remove white label marked "2".  Rub patch adhesive wings for 2 additional minutes.   While looking in a mirror, press and release button in center of patch.  A small green light will flash 3-4 times .  This will be your only indicator the monitor has been turned on.     Do not shower for the first 24 hours.  You may shower after the first 24 hours.   Press button if you feel a symptom. You will hear a small click.  Record Date, Time and Symptom in the Patient Log Book.   When you are ready to remove patch, follow instructions on last 2 pages of Patient Log Book.  Stick patch monitor onto last page of Patient Log Book.   Place Patient Log Book in [place] box.  Use locking tab on box and tape box closed securely.  The [employer] has [employer] on it.  Please place in mailbox as soon as possible.  Your physician should have your test results approximately 7 days after the monitor has been mailed back to [facility].   Call [place] at [phone] if you have questions regarding your ZIO XT patch monitor.  Call them immediately if you see an orange light blinking on your monitor.   If your monitor falls off in less  than 4 days contact our Monitor department at [phone].  If your monitor becomes loose or falls off after 4 days call [employer] at [phone] for suggestions on securing your monitor.     Follow-Up: At [facility], you and your health needs are our priority.  As part of our continuing mission to provide you with exceptional heart care, we have created designated Provider Care Teams.  These Care Teams include your primary Cardiologist (physician) and Advanced Practice Providers (APPs -  Physician Assistants and Nurse Practitioners) who all work together to provide you with the care you need, when you need it.  We recommend signing up for the patient portal called  "MyChart".  Sign up information is provided on this After Visit Summary.  MyChart is used to connect with patients for Virtual Visits (Telemedicine).  Patients are able to view lab/test results, encounter notes, upcoming appointments, etc.  Non-urgent messages can be sent to your provider as well.   To learn more about what you can do with MyChart, go to [email or web address].    Your next appointment:   6 month(s)  The format for your next appointment:   In Person  Provider:   You may see Sanda Klein, MD or one of the following Advanced Practice Providers on your designated Care Team:    Almyra Deforest, PA-C  Fabian Sharp, PA-C or   Roby Lofts, [place]

## 2020-01-16 ENCOUNTER — Encounter: Payer: Self-pay | Admitting: Cardiovascular Disease

## 2020-01-20 ENCOUNTER — Other Ambulatory Visit (INDEPENDENT_AMBULATORY_CARE_PROVIDER_SITE_OTHER): Payer: Medicare Other

## 2020-01-20 DIAGNOSIS — I480 Paroxysmal atrial fibrillation: Secondary | ICD-10-CM

## 2020-01-31 ENCOUNTER — Other Ambulatory Visit: Payer: Self-pay | Admitting: Internal Medicine

## 2020-02-04 DIAGNOSIS — I480 Paroxysmal atrial fibrillation: Secondary | ICD-10-CM | POA: Diagnosis not present

## 2020-02-08 DIAGNOSIS — H401131 Primary open-angle glaucoma, bilateral, mild stage: Secondary | ICD-10-CM | POA: Diagnosis not present

## 2020-02-22 ENCOUNTER — Ambulatory Visit (INDEPENDENT_AMBULATORY_CARE_PROVIDER_SITE_OTHER): Payer: Medicare Other | Admitting: Internal Medicine

## 2020-02-22 ENCOUNTER — Encounter: Payer: Self-pay | Admitting: Internal Medicine

## 2020-02-22 ENCOUNTER — Other Ambulatory Visit: Payer: Self-pay | Admitting: Internal Medicine

## 2020-02-22 ENCOUNTER — Other Ambulatory Visit: Payer: Self-pay

## 2020-02-22 DIAGNOSIS — I119 Hypertensive heart disease without heart failure: Secondary | ICD-10-CM | POA: Diagnosis not present

## 2020-02-22 DIAGNOSIS — B078 Other viral warts: Secondary | ICD-10-CM

## 2020-02-22 DIAGNOSIS — E559 Vitamin D deficiency, unspecified: Secondary | ICD-10-CM | POA: Diagnosis not present

## 2020-02-22 DIAGNOSIS — Z6835 Body mass index (BMI) 35.0-35.9, adult: Secondary | ICD-10-CM | POA: Diagnosis not present

## 2020-02-22 MED ORDER — OXYCODONE HCL 15 MG PO TABS: 15.0000 mg | ORAL_TABLET | Freq: Four times a day (QID) | ORAL | 0 refills | Status: DC

## 2020-02-22 MED ORDER — OXYCODONE HCL 15 MG PO TABS: 15.0000 mg | ORAL_TABLET | Freq: Four times a day (QID) | ORAL | 0 refills | Status: DC | PRN

## 2020-02-22 NOTE — Patient Instructions (Signed)
Sign up for [employer] [facility] ( via Libby app on your phone or your ipad). If you don't have a library card  - go to any library branch. They will set you up in 15 minutes. It is free. You can check out books to read and to listen, check out magazines and newspapers, movies etc.   

## 2020-02-22 NOTE — Assessment & Plan Note (Signed)
Losartan 

## 2020-02-22 NOTE — Assessment & Plan Note (Signed)
Vit D 

## 2020-02-22 NOTE — Progress Notes (Addendum)
Subjective:  Patient ID: Cody Frazier, male    DOB: [birth year]  Age: 74 y.o. MRN: [id number]  CC: No chief complaint on file.   HPI Cody Frazier presents for LBP, HTN, CAD f/u C/o skin lesions  Outpatient Medications Prior to Visit  Medication Sig Dispense Refill  . acetaminophen (TYLENOL) 500 MG tablet Take 1,000 mg by mouth every 6 (six) hours as needed for mild pain or moderate pain.    Marland Kitchen atorvastatin (LIPITOR) 20 MG tablet TAKE 1 TABLET BY MOUTH EVERY DAY 90 tablet 3  . cetirizine (ZYRTEC) 10 MG tablet Take 10 mg by mouth daily.    . Cholecalciferol (VITAMIN D3) 1.25 MG (50000 UT) CAPS Take 1 capsule by mouth every 30 (thirty) days. 3 capsule 3  . diltiazem (CARDIZEM CD) 240 MG 24 hr capsule TAKE 1 CAPSULE BY MOUTH EVERY DAY 90 capsule 3  . ELIQUIS 2.5 MG TABS tablet TAKE 1 TABLET BY MOUTH TWICE A DAY 60 tablet 11  . famotidine (PEPCID) 40 MG tablet Take 1 tablet (40 mg total) by mouth at bedtime. 30 tablet 11  . fluticasone (FLONASE) 50 MCG/ACT nasal spray SPRAY 2 SPRAYS INTO EACH NOSTRIL EVERY DAY 48 mL 5  . hydrochlorothiazide (MICROZIDE) 12.5 MG capsule Take 1 capsule (12.5 mg total) by mouth daily. 90 capsule 3  . latanoprost (XALATAN) 0.005 % ophthalmic solution Place 1 drop into both eyes at bedtime.  11  . metoprolol succinate (TOPROL-XL) 25 MG 24 hr tablet TAKE 1 TABLET BY MOUTH EVERY DAY 90 tablet 2  . Multiple Vitamins-Minerals (CENTRUM SILVER PO) Take by mouth daily.      Marland Kitchen oxyCODONE (ROXICODONE) 15 MG immediate release tablet Take 1 tablet (15 mg total) by mouth 4 (four) times daily as needed for pain. 120 tablet 0  . pantoprazole (PROTONIX) 40 MG tablet Take 1 tablet (40 mg total) by mouth 2 (two) times daily. 60 tablet 11  . polyethylene glycol (MIRALAX / GLYCOLAX) packet Take 17 g by mouth daily. (Patient taking differently: Take 17 g by mouth daily as needed for mild constipation. ) 14 each 0  . telmisartan (MICARDIS) 40 MG tablet Take 1 tablet (40 mg total)  by mouth daily. 90 tablet 1  . timolol (TIMOPTIC) 0.25 % ophthalmic solution INSTILL 1 DROP BY OPHTHALMIC ROUTE TWICE DAILY INTO BOTH EYES    . triamcinolone cream (KENALOG) 0.5 % Apply 1 application topically 3 (three) times daily. 90 g 1   No facility-administered medications prior to visit.    ROS: Review of Systems  Constitutional: Negative for appetite change, fatigue and unexpected weight change.  HENT: Negative for congestion, nosebleeds, sneezing, sore throat and trouble swallowing.   Eyes: Negative for itching and visual disturbance.  Respiratory: Negative for cough.   Cardiovascular: Negative for chest pain, palpitations and leg swelling.  Gastrointestinal: Negative for abdominal distention, blood in stool, diarrhea and nausea.  Genitourinary: Negative for frequency and hematuria.  Musculoskeletal: Positive for back pain. Negative for gait problem, joint swelling and neck pain.  Skin: Negative for rash.  Neurological: Negative for dizziness, tremors, speech difficulty and weakness.  Psychiatric/Behavioral: Negative for agitation, dysphoric mood and sleep disturbance. The patient is nervous/anxious.     Objective:  BP 134/84 (BP Location: Left Arm, Patient Position: Sitting, Cuff Size: Large)   Pulse 72   Temp 98.4 F (36.9 C) (Oral)   Ht 5\' 6"  (1.676 m)   Wt 245 lb (111.1 kg)   SpO2 97%   BMI  39.54 kg/m   BP Readings from Last 3 Encounters:  02/22/20 134/84  01/15/20 121/73  12/15/19 114/70    Wt Readings from Last 3 Encounters:  02/22/20 245 lb (111.1 kg)  01/15/20 244 lb 12.8 oz (111 kg)  12/15/19 245 lb 6.4 oz (111.3 kg)    Physical Exam Constitutional:      General: He is not in acute distress.    Appearance: He is well-developed. He is obese.     Comments: NAD  Eyes:     Conjunctiva/sclera: Conjunctivae normal.     Pupils: Pupils are equal, round, and reactive to light.  Neck:     Thyroid: No thyromegaly.     Vascular: No JVD.  Cardiovascular:      Rate and Rhythm: Normal rate and regular rhythm.     Heart sounds: Normal heart sounds. No murmur. No friction rub. No gallop.   Pulmonary:     Effort: Pulmonary effort is normal. No respiratory distress.     Breath sounds: Normal breath sounds. No wheezing or rales.  Chest:     Chest wall: No tenderness.  Abdominal:     General: Bowel sounds are normal. There is no distension.     Palpations: Abdomen is soft. There is no mass.     Tenderness: There is abdominal tenderness. There is no guarding or rebound.  Musculoskeletal:        General: No tenderness. Normal range of motion.     Cervical back: Normal range of motion.  Lymphadenopathy:     Cervical: No cervical adenopathy.  Skin:    General: Skin is warm and dry.     Findings: No rash.  Neurological:     Mental Status: He is alert and oriented to person, place, and time.     Cranial Nerves: No cranial nerve deficit.     Motor: No weakness or abnormal muscle tone.     Coordination: Coordination normal.     Gait: Gait abnormal.     Deep Tendon Reflexes: Reflexes are normal and symmetric.  Psychiatric:        Behavior: Behavior normal.        Thought Content: Thought content normal.        Judgment: Judgment normal.   LS tender Face, scalp w/warts   Procedure Note :     Procedure : Cryosurgery   Indication:  Wart(s)    Risks including unsuccessful procedure , bleeding, infection, bruising, scar, a need for a repeat  procedure and others were explained to the patient in detail as well as the benefits. Informed consent was obtained verbally.   4  lesion(s)  on face   was/were treated with liquid nitrogen on a Q-tip in a usual fasion . Band-Aid was applied and antibiotic ointment was given for a later use.   Tolerated well. Complications none.   Postprocedure instructions :     Keep the wounds clean. You can wash them with liquid soap and water. Pat dry with gauze or a Kleenex tissue  Before applying antibiotic ointment  and a Band-Aid.   You need to report immediately  if  any signs of infection develop.     Lab Results  Component Value Date   WBC 6.8 12/15/2019   HGB 14.0 12/15/2019   HCT 42.1 12/15/2019   PLT 320.0 12/15/2019   GLUCOSE 110 (H) 08/26/2019   CHOL 196 11/20/2018   TRIG 269.0 (H) 11/20/2018   HDL 43.60 11/20/2018   LDLDIRECT 109.0 11/20/2018  LDLCALC 86 11/25/2017   ALT 11 08/26/2019   AST 10 08/26/2019   NA 138 08/26/2019   K 4.3 08/26/2019   CL 101 08/26/2019   CREATININE 1.26 08/26/2019   BUN 20 08/26/2019   CO2 26 08/26/2019   TSH 1.55 08/26/2019   PSA 0.16 11/20/2018   INR 1.1 01/06/2019   HGBA1C 6.0 08/26/2019    DG Foot Complete Right  Result Date: 06/17/2019 CLINICAL DATA:  Right foot deformity, new EXAM: RIGHT FOOT COMPLETE - 3+ VIEW COMPARISON:  None. FINDINGS: No evidence of acute fracture or traumatic malalignment. There is a hallux valgus deformity of the first ray. The second through fifth rays demonstrate clawtoe deformities. No acute fracture or traumatic malalignment. Circumferential soft tissue swelling is present about the forefoot. No subcutaneous gas or foreign body. No osseous erosion or destructive changes. Mild degenerative changes are present in the midfoot though midfoot alignment is incompletely assessed on nonweightbearing radiographs. IMPRESSION: Circumferential soft tissue swelling about the forefoot. Hallux valgus deformity of the first ray. Clawtoe deformities of the second through fifth rays. Finding is non-specific but can be seen in the setting of synovitis. Electronically Signed   By: Lovena Le M.D.   On: 06/17/2019 22:05    Assessment & Plan:   Walker Kehr, MD

## 2020-02-22 NOTE — Telephone Encounter (Signed)
New Message:   Pt is calling and states his                                             oxyCODONE (ROXICODONE) 15 MG immediate release tablet oxyCODONE (ROXICODONE) 15 MG immediate release tablet oxyCODONE (ROXICODONE) 15 MG immediate release tablet  [facility] [address] [facility]. He would like to know if [id number] [place], [place] [address]. Pt ask that we contact him when it has been sent to this pharmacy.

## 2020-02-22 NOTE — Assessment & Plan Note (Signed)
Wt Readings from Last 3 Encounters:  02/22/20 245 lb (111.1 kg)  01/15/20 244 lb 12.8 oz (111 kg)  12/15/19 245 lb 6.4 oz (111.3 kg)

## 2020-02-22 NOTE — Assessment & Plan Note (Signed)
See Cryo 

## 2020-02-23 ENCOUNTER — Telehealth: Payer: Self-pay

## 2020-02-23 NOTE — Telephone Encounter (Signed)
RX request to pcp

## 2020-02-23 NOTE — Telephone Encounter (Signed)
[facility] [address] [facility] not [facility] medication and is requesting [facility]  Pt out of medication

## 2020-02-23 NOTE — Telephone Encounter (Signed)
New Message  [facility] [address] [facility] advise patient to transfer rx to another pharmacy.   1.Medication Requested: oxyCODONE (ROXICODONE) 15 MG immediate release tablet oxyCODONE (ROXICODONE) 15 MG immediate release tablet oxyCODONE (ROXICODONE) 15 MG immediate release tablet  2. Pharmacy (Name, Street, [place] [address]  3. On Med List: Yes   4. Last Visit with PCP: 4.12.21  5. Next visit date with PCP: 7.12.21    Agent: Please be advised that RX refills may take up to 3 business days. We ask that you follow-up with your pharmacy.

## 2020-02-27 ENCOUNTER — Encounter: Payer: Self-pay | Admitting: Internal Medicine

## 2020-04-15 ENCOUNTER — Encounter: Payer: Self-pay | Admitting: Gastroenterology

## 2020-04-20 ENCOUNTER — Other Ambulatory Visit: Payer: Self-pay | Admitting: Internal Medicine

## 2020-05-12 ENCOUNTER — Other Ambulatory Visit: Payer: Self-pay | Admitting: Cardiovascular Disease

## 2020-05-23 ENCOUNTER — Encounter: Payer: Self-pay | Admitting: Internal Medicine

## 2020-05-23 ENCOUNTER — Ambulatory Visit (INDEPENDENT_AMBULATORY_CARE_PROVIDER_SITE_OTHER): Payer: Medicare Other | Admitting: Internal Medicine

## 2020-05-23 ENCOUNTER — Other Ambulatory Visit: Payer: Self-pay

## 2020-05-23 VITALS — BP 132/88 | HR 69 | Temp 98.4°F | Ht 66.0 in | Wt 245.0 lb

## 2020-05-23 DIAGNOSIS — R739 Hyperglycemia, unspecified: Secondary | ICD-10-CM

## 2020-05-23 DIAGNOSIS — M519 Unspecified thoracic, thoracolumbar and lumbosacral intervertebral disc disorder: Secondary | ICD-10-CM | POA: Diagnosis not present

## 2020-05-23 DIAGNOSIS — E559 Vitamin D deficiency, unspecified: Secondary | ICD-10-CM | POA: Diagnosis not present

## 2020-05-23 DIAGNOSIS — D485 Neoplasm of uncertain behavior of skin: Secondary | ICD-10-CM

## 2020-05-23 DIAGNOSIS — E785 Hyperlipidemia, unspecified: Secondary | ICD-10-CM

## 2020-05-23 DIAGNOSIS — F411 Generalized anxiety disorder: Secondary | ICD-10-CM

## 2020-05-23 DIAGNOSIS — Z6835 Body mass index (BMI) 35.0-35.9, adult: Secondary | ICD-10-CM | POA: Diagnosis not present

## 2020-05-23 MED ORDER — OXYCODONE HCL 15 MG PO TABS: 15.0000 mg | ORAL_TABLET | Freq: Four times a day (QID) | ORAL | 0 refills | Status: DC

## 2020-05-23 MED ORDER — OXYCODONE HCL 15 MG PO TABS: 15.0000 mg | ORAL_TABLET | Freq: Four times a day (QID) | ORAL | 0 refills | Status: DC | PRN

## 2020-05-23 NOTE — Assessment & Plan Note (Signed)
LS area - bx adviced

## 2020-05-23 NOTE — Assessment & Plan Note (Addendum)
Severe LBP Oxycodone   Potential benefits of a long term opioids use as well as potential risks (i.e. addiction risk, apnea etc) and complications (i.e. Somnolence, constipation and others) were explained to the patient and were aknowledged.

## 2020-05-23 NOTE — Assessment & Plan Note (Signed)
Vit d 

## 2020-05-23 NOTE — Assessment & Plan Note (Signed)
Lipitor 

## 2020-05-23 NOTE — Addendum Note (Signed)
Addended by: Cresenciano Lick on: 05/23/2020 02:07 PM   Modules accepted: Orders

## 2020-05-23 NOTE — Progress Notes (Signed)
Subjective:  Patient ID: Cody Frazier, male    DOB: 06-08-1946  Age: 74 y.o. MRN: [id number]  CC: No chief complaint on file.   HPI PAVEL GADD presents for LBP, HTN, GERD f/u  Outpatient Medications Prior to Visit  Medication Sig Dispense Refill  . acetaminophen (TYLENOL) 500 MG tablet Take 1,000 mg by mouth every 6 (six) hours as needed for mild pain or moderate pain.    Marland Kitchen atorvastatin (LIPITOR) 20 MG tablet TAKE 1 TABLET BY MOUTH EVERY DAY 90 tablet 3  . cetirizine (ZYRTEC) 10 MG tablet Take 10 mg by mouth daily.    . Cholecalciferol (VITAMIN D3) 1.25 MG (50000 UT) CAPS Take 1 capsule by mouth every 30 (thirty) days. 3 capsule 3  . diltiazem (CARDIZEM CD) 240 MG 24 hr capsule TAKE 1 CAPSULE BY MOUTH EVERY DAY 90 capsule 3  . ELIQUIS 2.5 MG TABS tablet TAKE 1 TABLET BY MOUTH TWICE A DAY 60 tablet 11  . famotidine (PEPCID) 40 MG tablet Take 1 tablet (40 mg total) by mouth at bedtime. 30 tablet 11  . fluticasone (FLONASE) 50 MCG/ACT nasal spray SPRAY 2 SPRAYS INTO EACH NOSTRIL EVERY DAY 48 mL 5  . hydrochlorothiazide (MICROZIDE) 12.5 MG capsule Take 1 capsule (12.5 mg total) by mouth daily. 90 capsule 3  . latanoprost (XALATAN) 0.005 % ophthalmic solution Place 1 drop into both eyes at bedtime.  11  . metoprolol succinate (TOPROL-XL) 25 MG 24 hr tablet TAKE 1 TABLET BY MOUTH EVERY DAY 90 tablet 2  . Multiple Vitamins-Minerals (CENTRUM SILVER PO) Take by mouth daily.      . pantoprazole (PROTONIX) 40 MG tablet Take 1 tablet (40 mg total) by mouth 2 (two) times daily. 60 tablet 11  . polyethylene glycol (MIRALAX / GLYCOLAX) packet Take 17 g by mouth daily. (Patient taking differently: Take 17 g by mouth daily as needed for mild constipation. ) 14 each 0  . telmisartan (MICARDIS) 40 MG tablet TAKE 1 TABLET BY MOUTH EVERY DAY 90 tablet 1  . timolol (TIMOPTIC) 0.25 % ophthalmic solution INSTILL 1 DROP BY OPHTHALMIC ROUTE TWICE DAILY INTO BOTH EYES    . triamcinolone cream (KENALOG)  0.5 % Apply 1 application topically 3 (three) times daily. 90 g 1  . oxyCODONE (ROXICODONE) 15 MG immediate release tablet Take 1 tablet (15 mg total) by mouth 4 (four) times daily as needed for pain. 120 tablet 0  . oxyCODONE (ROXICODONE) 15 MG immediate release tablet Take 1 tablet (15 mg total) by mouth 4 (four) times daily. 120 tablet 0  . oxyCODONE (ROXICODONE) 15 MG immediate release tablet Take 1 tablet (15 mg total) by mouth 4 (four) times daily. 120 tablet 0   No facility-administered medications prior to visit.    ROS: Review of Systems  Constitutional: Negative for appetite change, fatigue and unexpected weight change.  HENT: Negative for congestion, nosebleeds, sneezing, sore throat and trouble swallowing.   Eyes: Negative for itching and visual disturbance.  Respiratory: Negative for cough.   Cardiovascular: Negative for chest pain, palpitations and leg swelling.  Gastrointestinal: Negative for abdominal distention, blood in stool, diarrhea and nausea.  Genitourinary: Negative for frequency and hematuria.  Musculoskeletal: Positive for arthralgias, back pain and gait problem. Negative for joint swelling and neck pain.  Skin: Negative for rash.  Neurological: Negative for dizziness, tremors, speech difficulty and weakness.  Psychiatric/Behavioral: Negative for agitation, dysphoric mood and sleep disturbance. The patient is not nervous/anxious.     Objective:  BP 132/88 (BP Location: Right Arm, Patient Position: Sitting, Cuff Size: Large)   Pulse 69   Temp 98.4 F (36.9 C) (Oral)   Ht 5\' 6"  (1.676 m)   Wt 245 lb (111.1 kg)   SpO2 97%   BMI 39.54 kg/m   BP Readings from Last 3 Encounters:  05/23/20 132/88  02/22/20 134/84  01/15/20 121/73    Wt Readings from Last 3 Encounters:  05/23/20 245 lb (111.1 kg)  02/22/20 245 lb (111.1 kg)  01/15/20 244 lb 12.8 oz (111 kg)    Physical Exam Constitutional:      General: He is not in acute distress.    Appearance: He  is well-developed. He is obese.     Comments: NAD  Eyes:     Conjunctiva/sclera: Conjunctivae normal.     Pupils: Pupils are equal, round, and reactive to light.  Neck:     Thyroid: No thyromegaly.     Vascular: No JVD.  Cardiovascular:     Rate and Rhythm: Normal rate and regular rhythm.     Heart sounds: Normal heart sounds. No murmur heard.  No friction rub. No gallop.   Pulmonary:     Effort: Pulmonary effort is normal. No respiratory distress.     Breath sounds: Normal breath sounds. No wheezing or rales.  Chest:     Chest wall: No tenderness.  Abdominal:     General: Bowel sounds are normal. There is no distension.     Palpations: Abdomen is soft. There is no mass.     Tenderness: There is no abdominal tenderness. There is no guarding or rebound.  Musculoskeletal:        General: No tenderness. Normal range of motion.     Cervical back: Normal range of motion.  Lymphadenopathy:     Cervical: No cervical adenopathy.  Skin:    General: Skin is warm and dry.     Findings: No rash.  Neurological:     Mental Status: He is alert and oriented to person, place, and time.     Cranial Nerves: No cranial nerve deficit.     Motor: Weakness present. No abnormal muscle tone.     Coordination: Coordination abnormal.     Gait: Gait abnormal.     Deep Tendon Reflexes: Reflexes are normal and symmetric.  Psychiatric:        Behavior: Behavior normal.        Thought Content: Thought content normal.        Judgment: Judgment normal.    Obese LS w/pain on ROM Moles on back  Lab Results  Component Value Date   WBC 6.8 12/15/2019   HGB 14.0 12/15/2019   HCT 42.1 12/15/2019   PLT 320.0 12/15/2019   GLUCOSE 110 (H) 08/26/2019   CHOL 196 11/20/2018   TRIG 269.0 (H) 11/20/2018   HDL 43.60 11/20/2018   LDLDIRECT 109.0 11/20/2018   LDLCALC 86 11/25/2017   ALT 11 08/26/2019   AST 10 08/26/2019   NA 138 08/26/2019   K 4.3 08/26/2019   CL 101 08/26/2019   CREATININE 1.26  08/26/2019   BUN 20 08/26/2019   CO2 26 08/26/2019   TSH 1.55 08/26/2019   PSA 0.16 11/20/2018   INR 1.1 01/06/2019   HGBA1C 6.0 08/26/2019    DG Foot Complete Right  Result Date: 06/17/2019 CLINICAL DATA:  Right foot deformity, new EXAM: RIGHT FOOT COMPLETE - 3+ VIEW COMPARISON:  None. FINDINGS: No evidence of acute fracture or traumatic malalignment.  There is a hallux valgus deformity of the first ray. The second through fifth rays demonstrate clawtoe deformities. No acute fracture or traumatic malalignment. Circumferential soft tissue swelling is present about the forefoot. No subcutaneous gas or foreign body. No osseous erosion or destructive changes. Mild degenerative changes are present in the midfoot though midfoot alignment is incompletely assessed on nonweightbearing radiographs. IMPRESSION: Circumferential soft tissue swelling about the forefoot. Hallux valgus deformity of the first ray. Clawtoe deformities of the second through fifth rays. Finding is non-specific but can be seen in the setting of synovitis. Electronically Signed   By: Lovena Le M.D.   On: 06/17/2019 22:05    Assessment & Plan:   There are no diagnoses linked to this encounter.   Meds ordered this encounter  Medications  . oxyCODONE (ROXICODONE) 15 MG immediate release tablet    Sig: Take 1 tablet (15 mg total) by mouth 4 (four) times daily.    Dispense:  120 tablet    Refill:  0    Fill on or after 07/22/20  . oxyCODONE (ROXICODONE) 15 MG immediate release tablet    Sig: Take 1 tablet (15 mg total) by mouth 4 (four) times daily.    Dispense:  120 tablet    Refill:  0    Fill on or after 05/23/20  . oxyCODONE (ROXICODONE) 15 MG immediate release tablet    Sig: Take 1 tablet (15 mg total) by mouth 4 (four) times daily as needed for pain.    Dispense:  120 tablet    Refill:  0    Fill on or after 06/22/20     Follow-up: No follow-ups on file.  Walker Kehr, MD

## 2020-05-23 NOTE — Assessment & Plan Note (Signed)
Diet Fasting

## 2020-05-23 NOTE — Patient Instructions (Signed)

## 2020-05-23 NOTE — Assessment & Plan Note (Signed)
Buspar

## 2020-05-24 LAB — HEPATIC FUNCTION PANEL
AG Ratio: 1.5 (calc) (ref 1.0–2.5)
ALT: 14 U/L (ref 9–46)
AST: 15 U/L (ref 10–35)
Albumin: 4.3 g/dL (ref 3.6–5.1)
Alkaline phosphatase (APISO): 126 U/L (ref 35–144)
Bilirubin, Direct: 0.1 mg/dL (ref 0.0–0.2)
Globulin: 2.8 g/dL (calc) (ref 1.9–3.7)
Indirect Bilirubin: 0.4 mg/dL (calc) (ref 0.2–1.2)
Total Bilirubin: 0.5 mg/dL (ref 0.2–1.2)
Total Protein: 7.1 g/dL (ref 6.1–8.1)

## 2020-05-24 LAB — BASIC METABOLIC PANEL
BUN: 17 mg/dL (ref 7–25)
CO2: 25 mmol/L (ref 20–32)
Calcium: 10.4 mg/dL — ABNORMAL HIGH (ref 8.6–10.3)
Chloride: 103 mmol/L (ref 98–110)
Creat: 1.11 mg/dL (ref 0.70–1.18)
Glucose, Bld: 113 mg/dL — ABNORMAL HIGH (ref 65–99)
Potassium: 5.1 mmol/L (ref 3.5–5.3)
Sodium: 141 mmol/L (ref 135–146)

## 2020-05-24 LAB — HEMOGLOBIN A1C
Hgb A1c MFr Bld: 5.5 % of total Hgb (ref <5.7)
Mean Plasma Glucose: 111 (calc)
eAG (mmol/L): 6.2 (calc)

## 2020-05-24 LAB — TSH: TSH: 1.04 mIU/L (ref 0.40–4.50)

## 2020-06-14 ENCOUNTER — Telehealth: Payer: Self-pay

## 2020-06-14 ENCOUNTER — Encounter: Payer: Self-pay | Admitting: Gastroenterology

## 2020-06-14 ENCOUNTER — Ambulatory Visit (INDEPENDENT_AMBULATORY_CARE_PROVIDER_SITE_OTHER): Payer: Medicare Other | Admitting: Gastroenterology

## 2020-06-14 VITALS — BP 142/82 | HR 76 | Ht 64.25 in | Wt 244.5 lb

## 2020-06-14 DIAGNOSIS — R1310 Dysphagia, unspecified: Secondary | ICD-10-CM | POA: Diagnosis not present

## 2020-06-14 DIAGNOSIS — K2270 Barrett's esophagus without dysplasia: Secondary | ICD-10-CM | POA: Diagnosis not present

## 2020-06-14 NOTE — Telephone Encounter (Signed)
[facility] Pre-operative Risk Assessment     Request for surgical clearance:     Endoscopy Procedure  What type of surgery is being performed?     Endoscopy with dilation  When is this surgery scheduled?     07/26/20  What type of clearance is required ?   Pharmacy  Are there any medications that need to be held prior to surgery and how long? Eliquis x 2 days  Practice name and name of physician performing surgery?      [facility]  What is your office phone and fax number?      Phone- [phone]  [phone]  Anesthesia type (None, local, MAC, general) ?       MAC

## 2020-06-14 NOTE — Patient Instructions (Signed)
You have been scheduled for an endoscopy. Please follow written instructions given to you at your visit today. If you use inhalers (even only as needed), please bring them with you on the day of your procedure.  Thank you for choosing me and [facility].  Malcolm T. Stark, Jr., MD., FACG  

## 2020-06-14 NOTE — Telephone Encounter (Signed)
Clinical pharmacist to review Eliquis 

## 2020-06-14 NOTE — Telephone Encounter (Signed)
Patient with diagnosis of Atrial Fibrillation on Eliquis for anticoagulation.    Procedure: Endoscopy Date of procedure: 07/26/20  CHADS2-VASc score of 3 (HTN, AGE, CAD)  CrCl 64 ml/min Platelet count 320 ml/min  Per office protocol, patient can hold Eliquis for 2 days prior to procedure as requested  Patient should restart Eliquis on the evening of procedure or day after, at discretion of procedure MD  Lorel Monaco, PharmD PGY2 [place]

## 2020-06-14 NOTE — Telephone Encounter (Signed)
Please inform the patient regarding clinical pharmacist's recommendation

## 2020-06-14 NOTE — Progress Notes (Signed)
    History of Present Illness: This is a 74 year old white male with Barrett's esophagus on Eliquis due for surveillance EGD.  He complains of one occasion of difficulty swallowing and uncomfortable swallowing for several hours after eating ice cream.  He has noted other occasions with more mild esophageal symptoms from very cold foods and beverages.  He has a history of bleeding gastric ulcers and a strong family history of colon cancer.  Reflux symptoms are under good control.  No ongoing dysphagia or odynophagia.  Current Medications, Allergies, Past Medical History, Past Surgical History, Family History and Social History were reviewed in [employer] record.   Physical Exam: General: Well developed, well nourished, no acute distress Head: Normocephalic and atraumatic Eyes:  sclerae anicteric, EOMI Ears: Normal auditory acuity Mouth: Not examined, mask on during Covid-19 pandemic Lungs: Clear throughout to auscultation Heart: Regular rate and rhythm; no murmurs, rubs or bruits Abdomen: Soft, non tender and non distended. No masses, hepatosplenomegaly or hernias noted. Normal Bowel sounds Rectal: Not done Musculoskeletal: Symmetrical with no gross deformities  Pulses:  Normal pulses noted Extremities: No clubbing, cyanosis, edema or deformities noted Neurological: Alert oriented x 4, grossly nonfocal Psychological:  Alert and cooperative. Normal mood and affect   Assessment and Recommendations:  1.  Long segment Barrett's esophagus without dysplasia.  Continue pantoprazole 40 mg p.o. twice daily and famotidine 40 mg at bedtime.  Follow standard antireflux measures.  Schedule surveillance endoscopy. The risks (including bleeding, perforation, infection, missed lesions, medication reactions and possible hospitalization or surgery if complications occur), benefits, and alternatives to endoscopy with possible biopsy and possible dilation were discussed with the patient  and they consent to proceed.   2. PAF.  Maintained on Eliquis. The risks (including bleeding, perforation, infection, missed lesions, medication reactions and possible hospitalization or surgery if complications occur), benefits, and alternatives to endoscopy with possible biopsy and possible dilation were discussed with the patient and they consent to proceed.   3.  Family history of colon cancer.  Personal history of SSA. A 5-year interval colonoscopy is recommended in May 2023.  4. OSA.   5.  PAH.

## 2020-06-15 NOTE — Telephone Encounter (Signed)
I called to go over medication instructions for upcoming procedure with [facility], when I saw that GI office had left a message 55 minutes earlier. I left message apologized for the 2nd left message and that both offices were calling in regards to medication instructions for upcoming procedure. I left message pt can call [facility] office back and they will be able to go over the medication instructions with him. I will remove from the pre op call back pool.

## 2020-06-15 NOTE — Telephone Encounter (Signed)
Informed patient he can hold Eliquis 2 days prior to his procedure. Patient verbalized understanding. °

## 2020-06-15 NOTE — Telephone Encounter (Signed)
Left message for patient to return my call.

## 2020-06-24 ENCOUNTER — Telehealth: Payer: Self-pay | Admitting: Cardiovascular Disease

## 2020-06-24 NOTE — Telephone Encounter (Signed)
Patient called with MD advice. Med list updated

## 2020-06-24 NOTE — Telephone Encounter (Signed)
Returned call to patient of Dr. Loletha Grayer - he reports low BP several times - at night before bed. He is weak and concerned.   Last night BP was 84/54 and HR 66 Other readings: 94/61 HR 65 92/58  92/63  He reports BP is better during the day  Patient takes the following medications: Metoprolol succinate 25mg  - QHS Telmisartan 40mg  - QHS HCTZ 12.5mg  - QHS Diltiazem 240mg  - QHS  -- he reports the low BPs are BEFORE any medications   Advised will send message to MD/PharmD for advice regarding BP

## 2020-06-24 NOTE — Telephone Encounter (Signed)
    Pt c/o BP issue: STAT if pt c/o blurred vision, one-sided weakness or slurred speech  1. What are your last 5 BP readings? 84/54 HR 66  2. Are you having any other symptoms (ex. Dizziness, headache, blurred vision, passed out)? exteremly weak  3. What is your BP issue? Pt said his BP was low last night and it never happened before, he said he felt extremely weak and he is concern.

## 2020-06-24 NOTE — Addendum Note (Signed)
Addended by: Fidel Levy on: 06/24/2020 04:43 PM   Modules accepted: Orders

## 2020-06-24 NOTE — Telephone Encounter (Signed)
Please stop the HCTZ. The BP will increase gradually and reach a new steady state in about 2 weeks.

## 2020-07-19 ENCOUNTER — Other Ambulatory Visit: Payer: Self-pay | Admitting: Internal Medicine

## 2020-07-19 ENCOUNTER — Other Ambulatory Visit: Payer: Self-pay | Admitting: Cardiovascular Disease

## 2020-07-25 ENCOUNTER — Telehealth: Payer: Self-pay | Admitting: Gastroenterology

## 2020-07-25 ENCOUNTER — Telehealth: Payer: Self-pay

## 2020-07-25 NOTE — Telephone Encounter (Signed)
Patient called concerned about his medications and his blood pressures. Says he takes a BP pill every morning but lately as in the past couple of days he has checked hid pressure and they have been low with his HR increased. He says he has not felt great the past couple of days because of this. He is making a log of them and today his pressures have been 108/56, HR 92, and this afternoon 111/61, HR 89. He decided not to take his micardis today and said he will not take it tomorrow before the procedure unless it is high again. I agreed with him saying that he can always take his medications when he gets home from his procedure. He agreed and had no further questions.

## 2020-07-26 ENCOUNTER — Encounter: Payer: Self-pay | Admitting: Gastroenterology

## 2020-07-26 ENCOUNTER — Ambulatory Visit (AMBULATORY_SURGERY_CENTER): Payer: Medicare Other | Admitting: Gastroenterology

## 2020-07-26 ENCOUNTER — Other Ambulatory Visit: Payer: Self-pay

## 2020-07-26 VITALS — BP 142/75 | HR 87 | Temp 96.8°F | Resp 18 | Ht 64.25 in | Wt 244.0 lb

## 2020-07-26 DIAGNOSIS — K2270 Barrett's esophagus without dysplasia: Secondary | ICD-10-CM | POA: Diagnosis not present

## 2020-07-26 DIAGNOSIS — K449 Diaphragmatic hernia without obstruction or gangrene: Secondary | ICD-10-CM | POA: Diagnosis not present

## 2020-07-26 DIAGNOSIS — K3189 Other diseases of stomach and duodenum: Secondary | ICD-10-CM | POA: Diagnosis not present

## 2020-07-26 DIAGNOSIS — K225 Diverticulum of esophagus, acquired: Secondary | ICD-10-CM

## 2020-07-26 DIAGNOSIS — R1310 Dysphagia, unspecified: Secondary | ICD-10-CM | POA: Diagnosis not present

## 2020-07-26 HISTORY — PX: UPPER GASTROINTESTINAL ENDOSCOPY: SHX188

## 2020-07-26 MED ORDER — SODIUM CHLORIDE 0.9 % IV SOLN: 500.0000 mL | Freq: Once | INTRAVENOUS | Status: DC

## 2020-07-26 NOTE — Progress Notes (Signed)
PT taken to PACU. Monitors in place. VSS. Report given to RN. 

## 2020-07-26 NOTE — Op Note (Signed)
[place] Patient Name: Cody Frazier Procedure Date: 07/26/2020 10:02 AM MRN: [id number] Endoscopist: Ladene Artist , MD Age: 74 Referring MD:  Date of Birth: 14-Apr-1946 Gender: Male Account #: [id number] Procedure:                Upper GI endoscopy Indications:              Surveillance for malignancy due to personal history                            of Barrett's esophagus, Dysphagia Medicines:                Monitored Anesthesia Care Procedure:                Pre-Anesthesia Assessment:                           - Prior to the procedure, a History and Physical                            was performed, and patient medications and                            allergies were reviewed. The patient's tolerance of                            previous anesthesia was also reviewed. The risks                            and benefits of the procedure and the sedation                            options and risks were discussed with the patient.                            All questions were answered, and informed consent                            was obtained. Prior Anticoagulants: The patient has                            taken Eliquis (apixaban), last dose was 2 days                            prior to procedure. ASA Grade Assessment: III - A                            patient with severe systemic disease. After                            reviewing the risks and benefits, the patient was                            deemed in satisfactory condition to undergo the  procedure.                           After obtaining informed consent, the endoscope was                            passed under direct vision. Throughout the                            procedure, the patient's blood pressure, pulse, and                            oxygen saturations were monitored continuously. The                            Endoscope was introduced through the mouth, and                             advanced to the second part of duodenum. The upper                            GI endoscopy was accomplished without difficulty.                            The patient tolerated the procedure well. Scope In: Scope Out: Findings:                 There were esophageal mucosal changes secondary to                            established short-segment Barrett's disease present                            in the distal esophagus. The maximum longitudinal                            extent of these mucosal changes was 3 cm in length.                            Mucosa was biopsied with a cold forceps for                            histology in a targeted manner at intervals of 1 cm                            in the lower third of the esophagus. One specimen                            bottle was sent to pathology.                           No endoscopic abnormality was evident in the  esophagus to explain the patient's complaint of                            dysphagia. It was decided, however, to proceed with                            dilation of the entire esophagus. A guidewire was                            placed and the scope was withdrawn. Dilation was                            performed with a Savary dilator with no resistance                            at 16 mm.                           Diffuse severe inflammation characterized by                            erythema, friability and granularity was found in                            the entire examined stomach. Biopsies were taken                            with a cold forceps for histology.                           A small hiatal hernia was present.                           Evidence of a fundoplication was found in the                            gastric fundus. The wrap appeared loose. This was                            traversed.                           The exam of the stomach was  otherwise normal.                           A small non-bleeding diverticulum was found in the                            area of the papilla.                           The exam of the duodenum was otherwise normal. Complications:            No immediate complications. Estimated Blood Loss:     Estimated blood loss was minimal. Impression:               -  Esophageal mucosal changes secondary to                            established short-segment Barrett's disease.                            Biopsied.                           - No endoscopic esophageal abnormality to explain                            patient's dysphagia. Esophagus dilated.                           - Gastritis. Biopsied.                           - Small hiatal hernia.                           - A fundoplication was found. The wrap appears                            loose.                           - Non-bleeding duodenal diverticulum. Recommendation:           - Patient has a contact number available for                            emergencies. The signs and symptoms of potential                            delayed complications were discussed with the                            patient. Return to normal activities tomorrow.                            Written discharge instructions were provided to the                            patient.                           - Clear liquid diet for 2 hours, then advance as                            tolerated to soft diet today. Resume prior diet                            tomorrow.                           - Continue present medications.                           -  Await pathology results.                           - Repeat upper endoscopy in 3 years for                            surveillance of Barrett's esophagus if no dysplasia.                           - Resume Eliquis (apixaban) at prior dose in 2                            days. Refer to managing physician for further                             adjustment of therapy. Ladene Artist, MD 07/26/2020 10:37:52 AM This report has been signed electronically.

## 2020-07-26 NOTE — Progress Notes (Signed)
Pt's states no medical or surgical changes since previsit or office visit.  [facility] [place] 

## 2020-07-26 NOTE — Progress Notes (Signed)
Called to room to assist during endoscopic procedure.  Patient ID and intended procedure confirmed with present staff. Received instructions for my participation in the procedure from the performing physician.  

## 2020-07-26 NOTE — Patient Instructions (Signed)
Handouts given for Gastritis, Esophagitis and Stricture, Post-dilation diet and Barretts esophagus.  Resume Eliquis at prior dose in 2 days.  YOU HAD AN ENDOSCOPIC PROCEDURE TODAY AT [facility]:   Refer to the procedure report that was given to you for any specific questions about what was found during the examination.  If the procedure report does not answer your questions, please call your gastroenterologist to clarify.  If you requested that your care partner not be given the details of your procedure findings, then the procedure report has been included in a sealed envelope for you to review at your convenience later.  YOU SHOULD EXPECT: Some feelings of bloating in the abdomen. Passage of more gas than usual.  Walking can help get rid of the air that was put into your GI tract during the procedure and reduce the bloating. If you had a lower endoscopy (such as a colonoscopy or flexible sigmoidoscopy) you may notice spotting of blood in your stool or on the toilet paper. If you underwent a bowel prep for your procedure, you may not have a normal bowel movement for a few days.  Please Note:  You might notice some irritation and congestion in your nose or some drainage.  This is from the oxygen used during your procedure.  There is no need for concern and it should clear up in a day or so.  SYMPTOMS TO REPORT IMMEDIATELY:  Following upper endoscopy (EGD)  Vomiting of blood or coffee ground material  New chest pain or pain under the shoulder blades  Painful or persistently difficult swallowing  New shortness of breath  Fever of 100F or higher  Black, tarry-looking stools  For urgent or emergent issues, a gastroenterologist can be reached at any hour by calling [phone]. Do not use MyChart messaging for urgent concerns.      ACTIVITY:  You should plan to take it easy for the rest of today and you should NOT DRIVE or use heavy machinery until tomorrow (because of the  sedation medicines used during the test).    FOLLOW UP: Our staff will call the number listed on your records 48-72 hours following your procedure to check on you and address any questions or concerns that you may have regarding the information given to you following your procedure. If we do not reach you, we will leave a message.  We will attempt to reach you two times.  During this call, we will ask if you have developed any symptoms of COVID 19. If you develop any symptoms (ie: fever, flu-like symptoms, shortness of breath, cough etc.) before then, please call [phone].  If you test positive for Covid 19 in the 2 weeks post procedure, please call and report this information to [place].    If any biopsies were taken you will be contacted by phone or by letter within the next 1-3 weeks.  Please call us at [phone] if you have not heard about the biopsies in 3 weeks.    SIGNATURES/CONFIDENTIALITY: You and/or your care partner have signed paperwork which will be entered into your electronic medical record.  These signatures attest to the fact that that the information above on your After Visit Summary has been reviewed and is understood.  Full responsibility of the confidentiality of this discharge information lies with you and/or your care-partner.

## 2020-07-27 NOTE — Telephone Encounter (Signed)
See other phone note created. All questions answered by nurse.

## 2020-07-28 ENCOUNTER — Telehealth: Payer: Self-pay

## 2020-07-28 NOTE — Telephone Encounter (Signed)
  Follow up Call-  Call back number 07/26/2020  Post procedure Call Back phone  # [phone]  Permission to leave phone message Yes  Some recent data might be hidden     Patient questions:  Do you have a fever, pain , or abdominal swelling? No. Pain Score  0 *  Have you tolerated food without any problems? Yes.    Have you been able to return to your normal activities? Yes.    Do you have any questions about your discharge instructions: Diet   No. Medications  No. Follow up visit  No.  Do you have questions or concerns about your Care? No.  Actions: * If pain score is 4 or above: No action needed, pain <4.  Have you developed a fever since your procedure? No 2.   Have you had an respiratory symptoms (SOB or cough) since your procedure? No 3.   Have you tested positive for COVID 19 since your procedure No  4.   Have you had any family members/close contacts diagnosed with the COVID 19 since your procedure?  No   If yes to any of these questions please route to Joylene John, RN and Joella Prince, RN

## 2020-07-29 ENCOUNTER — Telehealth: Payer: Self-pay | Admitting: Cardiovascular Disease

## 2020-07-29 ENCOUNTER — Ambulatory Visit (INDEPENDENT_AMBULATORY_CARE_PROVIDER_SITE_OTHER): Payer: Medicare Other | Admitting: Cardiovascular Disease

## 2020-07-29 ENCOUNTER — Encounter: Payer: Self-pay | Admitting: Cardiovascular Disease

## 2020-07-29 ENCOUNTER — Other Ambulatory Visit: Payer: Self-pay

## 2020-07-29 VITALS — BP 106/59 | HR 103 | Temp 97.0°F | Ht 70.0 in | Wt 244.4 lb

## 2020-07-29 DIAGNOSIS — Z5181 Encounter for therapeutic drug level monitoring: Secondary | ICD-10-CM | POA: Diagnosis not present

## 2020-07-29 DIAGNOSIS — I272 Pulmonary hypertension, unspecified: Secondary | ICD-10-CM

## 2020-07-29 DIAGNOSIS — I119 Hypertensive heart disease without heart failure: Secondary | ICD-10-CM

## 2020-07-29 DIAGNOSIS — R600 Localized edema: Secondary | ICD-10-CM | POA: Diagnosis not present

## 2020-07-29 DIAGNOSIS — G4733 Obstructive sleep apnea (adult) (pediatric): Secondary | ICD-10-CM

## 2020-07-29 DIAGNOSIS — R0602 Shortness of breath: Secondary | ICD-10-CM

## 2020-07-29 DIAGNOSIS — I480 Paroxysmal atrial fibrillation: Secondary | ICD-10-CM | POA: Diagnosis not present

## 2020-07-29 DIAGNOSIS — Z9989 Dependence on other enabling machines and devices: Secondary | ICD-10-CM

## 2020-07-29 MED ORDER — FUROSEMIDE 40 MG PO TABS: 40.0000 mg | ORAL_TABLET | Freq: Every day | ORAL | 1 refills | Status: DC

## 2020-07-29 MED ORDER — TELMISARTAN 20 MG PO TABS
20.0000 mg | ORAL_TABLET | Freq: Every day | ORAL | 1 refills | Status: DC
Start: 2020-07-29 — End: 2020-09-06

## 2020-07-29 NOTE — Telephone Encounter (Signed)
Patient called in to office for shortness of breath. Patient states he has progressively gotten more short of breath over the last week. Patient denies chest pain, N/V, lightheadedness, and palpitations. Patient states his blood pressure has ranged from 90 systolic to 263 over the last week with his current BP at 131/72. Patient states his HR has been 100-104 over the last week with current HR 104. Patient states that his HR does feel regular. Patient unable to check weight today but states his last weight was 244lb. Patient states his feet are swollen but not more swollen than usual as this is a recurrent issue for patient. Patient states he can hardly walk across his living room with out having to sit down to catch his breath. Patient states he is fatigued, and feels better when he sits or lies down to rest. Scheduled patient for 3:20 DOD appointment slot today to be seen. Patient made aware that if symptoms were to worsen then to proceed to the ED for evaluation. Patient verbalized understanding and stated he will be in today for the 3:20 appointment with DOD.

## 2020-07-29 NOTE — Progress Notes (Signed)
Cardiology Office Note   Date:  07/29/2020   ID:  [name], [name] 04-Jun-1946, MRN [id number]  PCP:  Cassandria Anger, MD  Cardiologist:  Sanda Klein, MD  No chief complaint on file.    History of Present Illness: Cody Frazier is a 74 y.o. male with hypertension, hyperlipidemia, aortic atherosclerosis, prior GI bleed with iron deficiency anemia, Barrett's esophagus, mild pulmonary hypertension, paroxysmal atrial fibrillation, and OSA on CPAP who presents for an evaluation of shortness of breath.  He saw Dr. Loletha Grayer on 01/2020 and reported increased shortness of breath.  His blood pressure was poorly controlled and he was started on telmisartan at that time.  It was unclear whether his shortness of breath was attributable to his atrial fibrillation.  He wore an ambulatory monitor that did not reveal any arrhythmias that occurred at the time of his symptoms.  He called the office 06/24/2020 with report of hypotension.  It was recommended that he hold his hydrochlorothiazide.  He called the office today with increasing exertional dyspnea and palpitations though his heart rate was fast but regular.  He called the office because he feels short of breath walking even for short distances in his home.  He also notes swelling in his legs.  His heart rate has been elevated to the 100s but he thinks that his been regular.  When this occurs he has to lay down and rest.  Laying flat in the bed seems to help.  He denies orthopnea or PND.  Echo 12/2018 revealed LVEF 60-65% with grade 1 diastolic dysfunction.  His wife notes that his blood pressure is very labile.  When he gets anxious it goes up to the 150s over 90s.  However it also goes down as low as the 90s over 50s.  They note that it tends to get low later in the day.  He takes all his antihypertensives first thing in the morning.  Whenever his heart starts to race he takes his left over hydrochlorothiazide.  He has been doing this approximately twice  per week.  Mr. Rufener had an upper endoscopy on Tuesday.  He notes that he had a lot of air insufflation and continues to feel bloated.  He has been belching a lot.  He thinks that this is also making him feel more short of breath.  He has not experienced any chest pain or pressure.  He notes that his diet is very poor.  He pretty much eats whatever he wants and does not limit sodium intake.  In fact he is a fan of salty foods and eats them quite regularly.  Past Medical History:  Diagnosis Date  . Allergic rhinitis   . Anemia, iron deficiency   . Arthritis   . Atrial arrhythmia   . Atrial fib/flutter, transient    following prior surgeries x 2  . Barrett's esophagus without dysplasia   . BPH (benign prostatic hyperplasia)   . Bright's disease   . Chronic LBP   . Closed fracture of right distal radius   . DDD (degenerative disc disease)   . Diverticulosis of colon   . Duodenal stricture   . Gastric outlet obstruction   . GERD (gastroesophageal reflux disease)   . Glaucoma   . Hemorrhoids   . History of kidney stones   . Hyperlipidemia   . Hyperplastic colonic polyp 01/2000  . Hypertension   . Nephrolithiasis    hx of B  . Peptic ulcer disease with hemorrhage  08/2008   and [facility]. Pylori Ab negative  . Renal cyst   . Sleep apnea with use of continuous positive airway pressure (CPAP)   . Tubular adenoma of colon 03/2012  . Wears glasses     Past Surgical History:  Procedure Laterality Date  . APPENDECTOMY    . BACK SURGERY  02/2003   L5  . BILROTH I PROCEDURE  2011   Dr Johney Maine  . CLOSED REDUCTION WRIST FRACTURE Right 01/06/2019   Procedure: Closed Reduction Wrist/Pinning Wrist;  Surgeon: Dayna Barker, MD;  Location: [facility];  Service: Plastics;  Laterality: Right;  . COLONOSCOPY    . HIATAL HERNIA REPAIR    . LAPAROTOMY N/A 05/10/2017   Procedure: EXPLORATORY LAPAROTOMY WITH ENTEROLYSIS;  Surgeon: Johnathan Hausen, MD;  Location: [facility];  Service: General;  Laterality: N/A;    . LUMBAR FUSION  2009  . SMALL INTESTINE SURGERY    . TONSILLECTOMY    . UPPER GASTROINTESTINAL ENDOSCOPY  07/26/2020     Current Outpatient Medications  Medication Sig Dispense Refill  . acetaminophen (TYLENOL) 500 MG tablet Take 1,000 mg by mouth every 6 (six) hours as needed for mild pain or moderate pain.    Marland Kitchen atorvastatin (LIPITOR) 20 MG tablet TAKE 1 TABLET BY MOUTH EVERY DAY 90 tablet 3  . cetirizine (ZYRTEC) 10 MG tablet Take 10 mg by mouth daily.    . Cholecalciferol (VITAMIN D3) 1.25 MG (50000 UT) CAPS TAKE 1 CAPSULE BY MOUTH EVERY 30 (THIRTY) DAYS. 3 capsule 3  . diltiazem (CARDIZEM CD) 240 MG 24 hr capsule TAKE 1 CAPSULE BY MOUTH EVERY DAY 90 capsule 3  . ELIQUIS 2.5 MG TABS tablet TAKE 1 TABLET BY MOUTH TWICE A DAY 60 tablet 11  . famotidine (PEPCID) 40 MG tablet Take 1 tablet (40 mg total) by mouth at bedtime. 30 tablet 11  . fluticasone (FLONASE) 50 MCG/ACT nasal spray SPRAY 2 SPRAYS INTO EACH NOSTRIL EVERY DAY 48 mL 5  . latanoprost (XALATAN) 0.005 % ophthalmic solution Place 1 drop into both eyes at bedtime.  11  . metoprolol succinate (TOPROL-XL) 25 MG 24 hr tablet TAKE 1 TABLET BY MOUTH EVERY DAY 90 tablet 2  . Multiple Vitamins-Minerals (CENTRUM SILVER PO) Take by mouth daily.      Marland Kitchen oxyCODONE (ROXICODONE) 15 MG immediate release tablet Take 1 tablet (15 mg total) by mouth 4 (four) times daily. 120 tablet 0  . pantoprazole (PROTONIX) 40 MG tablet Take 1 tablet (40 mg total) by mouth 2 (two) times daily. 60 tablet 11  . polyethylene glycol (MIRALAX / GLYCOLAX) packet Take 17 g by mouth daily. (Patient taking differently: Take 17 g by mouth daily as needed for mild constipation. ) 14 each 0  . telmisartan (MICARDIS) 20 MG tablet Take 1 tablet (20 mg total) by mouth daily. 90 tablet 1  . timolol (TIMOPTIC) 0.25 % ophthalmic solution INSTILL 1 DROP BY OPHTHALMIC ROUTE TWICE DAILY INTO BOTH EYES    . triamcinolone cream (KENALOG) 0.5 % Apply 1 application topically 3 (three)  times daily. 90 g 1  . furosemide (LASIX) 40 MG tablet Take 1 tablet (40 mg total) by mouth daily. 90 tablet 1   No current facility-administered medications for this visit.    Allergies:   Codeine phosphate, Nsaids, and Adhesive [tape]    Social History:  The patient  reports that he has quit smoking. He has never used smokeless tobacco. He reports that he does not drink alcohol and does  not use drugs.   Family History:  The patient's family history includes Colon cancer in his cousin; Colon cancer (age of onset: 51) in his paternal uncle and paternal uncle; Colon cancer (age of onset: 16) in his father; Colon cancer (age of onset: 31) in his mother; Colon polyps in his father and mother; Drug abuse in his sister; Heart disease in his mother; Hypertension in an other family member; Prostate cancer in his father; Stroke in his father.    ROS:  Please see the history of present illness.   Otherwise, review of systems are positive for none   All other systems are reviewed and negative.    PHYSICAL EXAM: VS:  BP (!) 106/59   Pulse (!) 103   Temp (!) 97 F (36.1 C)   Ht 5\' 10"  (1.778 m)   Wt 244 lb 6.4 oz (110.9 kg)   SpO2 100%   BMI 35.07 kg/m  , BMI Body mass index is 35.07 kg/m. GENERAL:  Well appearing HEENT:  Pupils equal round and reactive, fundi not visualized, oral mucosa unremarkable NECK:  No jugular venous distention, waveform within normal limits, carotid upstroke brisk and symmetric, no bruits LUNGS:  Clear to auscultation bilaterally HEART:  RRR.  PMI not displaced or sustained,S1 and S2 within normal limits, no S3, no S4, no clicks, no rubs, no murmurs ABD:  Flat, positive bowel sounds normal in frequency in pitch, no bruits, no rebound, no guarding, no midline pulsatile mass, no hepatomegaly, no splenomegaly EXT:  2 plus pulses throughout, 2+ LE edema to lower tibia bilatearlly, no cyanosis no clubbing SKIN:  No rashes no nodules NEURO:  Cranial nerves II through XII  grossly intact, motor grossly intact throughout PSYCH:  Cognitively intact, oriented to person place and time  EKG:  EKG is ordered today. The ekg ordered today demonstrates sinus tachycardia.  Rate 103 bpm.    Recent Labs: 12/15/2019: Hemoglobin 14.0; Platelets 320.0 05/23/2020: ALT 14; BUN 17; Creat 1.11; Potassium 5.1; Sodium 141; TSH 1.04    Lipid Panel    Component Value Date/Time   CHOL 196 11/20/2018 1532   TRIG 269.0 (H) 11/20/2018 1532   TRIG 231 (HH) 09/13/2006 0957   HDL 43.60 11/20/2018 1532   CHOLHDL 4 11/20/2018 1532   VLDL 53.8 (H) 11/20/2018 1532   LDLCALC 86 11/25/2017 1021   LDLDIRECT 109.0 11/20/2018 1532      Wt Readings from Last 3 Encounters:  07/29/20 244 lb 6.4 oz (110.9 kg)  07/26/20 244 lb (110.7 kg)  06/14/20 244 lb 8 oz (110.9 kg)      ASSESSMENT AND PLAN:  # Shortness of breath: # LE Edema:  Mr. Gavitt has LE edema but his JVP is not elevated.  He has no crackles on exam.  We will check a BNP.  I suspect his is edema is related to stopping HCTZ and he likely has some diastolic dysfunction.  We will start Lasix 40 mg daily, which may not drop his blood pressure as much as the HCTZ.  I did tell him to stop taking hydrochlorothiazide whenever he thinks it is heart goes into atrial fibrillation.  # Essential hypertension:  # Hypotension:   His blood pressure seems to get low later in the day.  He is taking all his antihypertensives in the mornings.  He will continue to take the metoprolol and diltiazem in the morning.  We will reduce telmisartan to 20 mg and have him take it starting tomorrow evening.  Holding  HCTZ as above.  He will check his blood pressure at home and bring to his follow-up with Dr. Loletha Grayer.  # PAF:  Currently sinus tachycardia.  He has been taking extra HCTZ as needed for palpitations.  Recommended the if he needs something to consider metoprolol instead.  Continue Eliquis.  Current medicines are reviewed at length with the patient  today.  The patient does not have concerns regarding medicines.  The following changes have been made:  Start lasix.  Reduce telmisartan to 20mg .  Labs/ tests ordered today include:   Orders Placed This Encounter  Procedures  . B Nat Peptide  . Basic metabolic panel  . EKG 12-Lead  . ECHOCARDIOGRAM COMPLETE     Disposition:   FU with Dr. Sallyanne Kuster in 2 weeks as scheduled     Signed, Philemon Riedesel C. Oval Linsey, MD, [facility]  07/29/2020 4:56 PM    [facility]

## 2020-07-29 NOTE — Telephone Encounter (Signed)
Pt c/o Shortness Of Breath: STAT if SOB developed within the last 24 hours or pt is noticeably SOB on the phone  1. Are you currently SOB (can you hear that pt is SOB on the phone)? Yes   2. How long have you been experiencing SOB? About a week  3. Are you SOB when sitting or when up moving around? Not to bad with he is sitting when moving patient get SOB  4. Are you currently experiencing any other symptoms? Weak, light headed, SOB

## 2020-07-29 NOTE — Patient Instructions (Signed)
Medication Instructions:  START FUROSEMIDE 40 MG DAILY   DECREASE TELMISARTAN TO 20 MG AND START TAKING AT BEDTIME TOMORROW   OK TO TAKE AND EXTRA METOPROLOL AS NEEDED FOR HEART FLUTTERING  *If you need a refill on your cardiac medications before your next appointment, please call your pharmacy*  Lab Work: BNP TODAY  BMET 1 WEEK   If you have labs (blood work) drawn today and your tests are completely normal, you will receive your results only by: Marland Kitchen MyChart Message (if you have MyChart) OR . A paper copy in the mail If you have any lab test that is abnormal or we need to change your treatment, we will call you to review the results.  Testing/Procedures: Your physician has requested that you have an echocardiogram. Echocardiography is a painless test that uses sound waves to create images of your heart. It provides your doctor with information about the size and shape of your heart and how well your heart's chambers and valves are working. This procedure takes approximately one hour. There are no restrictions for this procedure. [facility] AT [address]   Follow-Up: KEEP FOLLOW UP AS SCHEDULED   Other Instructions MONITOR AND LONG YOUR BLOOD PRESSURE, BRING TO FOLLOW UP

## 2020-07-30 LAB — BRAIN NATRIURETIC PEPTIDE: BNP: 84 pg/mL (ref 0.0–100.0)

## 2020-08-01 NOTE — Progress Notes (Signed)
Thank you , Tiffany!!

## 2020-08-02 ENCOUNTER — Telehealth: Payer: Self-pay | Admitting: Cardiovascular Disease

## 2020-08-02 ENCOUNTER — Encounter: Payer: Self-pay | Admitting: Gastroenterology

## 2020-08-02 NOTE — Telephone Encounter (Signed)
Returned call to patient's wife. She reports low BP readings last night (88/44 - held telmisartan dose), this morning 107/59 and just now 97/54. Patient has same symptoms that he had last week (seen on 9/17). She reports he may have been a little confused today, she states he does not have much of an appetite, she states he has trouble thinking of some things but can recall things fine (this type of episode has occurred twice). He has no unilateral weakness/impairments, no speech difficulties   Advised OK to hold telmisartan if SBP under 110 tonight  She states he is very short-winded with minimal exertion but this is not a change from last week's visit.   Patient has echo scheduled tomorrow @ [phone]  Will send message to [facility] LPN & Dr. Oval Linsey

## 2020-08-02 NOTE — Telephone Encounter (Signed)
New Message   Patients wife is calling on his behalf concerned about patients blood pressure   Pt c/o BP issue:  1. What are your last 5 BP readings? 97/54, 88/49(last night)  2. Are you having any other symptoms (ex. Dizziness, headache, blurred vision, passed out)? Weak, short winded when goes from the bedroom to the bathroom and some confusion at times.  3. What is your medication issue?

## 2020-08-02 NOTE — Telephone Encounter (Signed)
Wife aware to continue to hold telmisartan. Routed to Dr. Demetrio Lapping RN as Juluis Rainier

## 2020-08-02 NOTE — Telephone Encounter (Signed)
Continue to hold telmisartan.  He is Dr. Lurline Del patient.

## 2020-08-03 ENCOUNTER — Ambulatory Visit (HOSPITAL_COMMUNITY): Payer: Medicare Other | Attending: Cardiology

## 2020-08-03 ENCOUNTER — Other Ambulatory Visit: Payer: Self-pay

## 2020-08-03 DIAGNOSIS — R600 Localized edema: Secondary | ICD-10-CM | POA: Diagnosis not present

## 2020-08-03 DIAGNOSIS — R0602 Shortness of breath: Secondary | ICD-10-CM | POA: Diagnosis not present

## 2020-08-03 LAB — ECHOCARDIOGRAM COMPLETE
AR max vel: 1.3 cm2
AV Area VTI: 1.54 cm2
AV Area mean vel: 1.54 cm2
AV Mean grad: 12 mmHg
AV Peak grad: 25.8 mmHg
Ao pk vel: 2.54 m/s
Area-P 1/2: 4.57 cm2
S' Lateral: 2.7 cm

## 2020-08-03 NOTE — Telephone Encounter (Signed)
Wife aware of Dr. Victorino December recommendation. Voiced understanding.

## 2020-08-03 NOTE — Telephone Encounter (Signed)
Thanks. Agree with advice. I would also suggest skipping the furosemide today, for one day.

## 2020-08-04 NOTE — Telephone Encounter (Signed)
Noted. Thanks.

## 2020-08-08 DIAGNOSIS — R0602 Shortness of breath: Secondary | ICD-10-CM | POA: Diagnosis not present

## 2020-08-08 DIAGNOSIS — Z5181 Encounter for therapeutic drug level monitoring: Secondary | ICD-10-CM | POA: Diagnosis not present

## 2020-08-08 DIAGNOSIS — R600 Localized edema: Secondary | ICD-10-CM | POA: Diagnosis not present

## 2020-08-09 LAB — BASIC METABOLIC PANEL
BUN/Creatinine Ratio: 11 (ref 10–24)
BUN: 14 mg/dL (ref 8–27)
CO2: 22 mmol/L (ref 20–29)
Calcium: 9 mg/dL (ref 8.6–10.2)
Chloride: 108 mmol/L — ABNORMAL HIGH (ref 96–106)
Creatinine, Ser: 1.28 mg/dL — ABNORMAL HIGH (ref 0.76–1.27)
GFR calc Af Amer: 63 mL/min/{1.73_m2} (ref >59)
GFR calc non Af Amer: 55 mL/min/{1.73_m2} — ABNORMAL LOW (ref >59)
Glucose: 108 mg/dL — ABNORMAL HIGH (ref 65–99)
Potassium: 3.9 mmol/L (ref 3.5–5.2)
Sodium: 145 mmol/L — ABNORMAL HIGH (ref 134–144)

## 2020-08-10 ENCOUNTER — Other Ambulatory Visit: Payer: Self-pay

## 2020-08-10 ENCOUNTER — Encounter: Payer: Self-pay | Admitting: Cardiovascular Disease

## 2020-08-10 ENCOUNTER — Ambulatory Visit (INDEPENDENT_AMBULATORY_CARE_PROVIDER_SITE_OTHER): Payer: Medicare Other | Admitting: Cardiovascular Disease

## 2020-08-10 VITALS — BP 123/63 | HR 91 | Ht 66.0 in | Wt 244.0 lb

## 2020-08-10 DIAGNOSIS — Z9989 Dependence on other enabling machines and devices: Secondary | ICD-10-CM

## 2020-08-10 DIAGNOSIS — I2721 Secondary pulmonary arterial hypertension: Secondary | ICD-10-CM

## 2020-08-10 DIAGNOSIS — Z7901 Long term (current) use of anticoagulants: Secondary | ICD-10-CM | POA: Diagnosis not present

## 2020-08-10 DIAGNOSIS — I700 Atherosclerosis of aorta: Secondary | ICD-10-CM | POA: Diagnosis not present

## 2020-08-10 DIAGNOSIS — I480 Paroxysmal atrial fibrillation: Secondary | ICD-10-CM | POA: Diagnosis not present

## 2020-08-10 DIAGNOSIS — D509 Iron deficiency anemia, unspecified: Secondary | ICD-10-CM

## 2020-08-10 DIAGNOSIS — I10 Essential (primary) hypertension: Secondary | ICD-10-CM | POA: Diagnosis not present

## 2020-08-10 DIAGNOSIS — E782 Mixed hyperlipidemia: Secondary | ICD-10-CM | POA: Diagnosis not present

## 2020-08-10 DIAGNOSIS — E46 Unspecified protein-calorie malnutrition: Secondary | ICD-10-CM | POA: Diagnosis not present

## 2020-08-10 DIAGNOSIS — I350 Nonrheumatic aortic (valve) stenosis: Secondary | ICD-10-CM

## 2020-08-10 DIAGNOSIS — N289 Disorder of kidney and ureter, unspecified: Secondary | ICD-10-CM

## 2020-08-10 DIAGNOSIS — D649 Anemia, unspecified: Secondary | ICD-10-CM | POA: Diagnosis not present

## 2020-08-10 DIAGNOSIS — G4733 Obstructive sleep apnea (adult) (pediatric): Secondary | ICD-10-CM | POA: Diagnosis not present

## 2020-08-10 DIAGNOSIS — D539 Nutritional anemia, unspecified: Secondary | ICD-10-CM

## 2020-08-10 MED ORDER — FUROSEMIDE 40 MG PO TABS: 40.0000 mg | ORAL_TABLET | ORAL | 1 refills | Status: DC

## 2020-08-10 NOTE — Patient Instructions (Signed)
Medication Instructions:  HOLD the Eliquis until further notice TAKE Furosemide every other day  *If you need a refill on your cardiac medications before your next appointment, please call your pharmacy*   Lab Work: Your provider would like for you to have the following labs today: CBC  If you have labs (blood work) drawn today and your tests are completely normal, you will receive your results only by: Marland Kitchen MyChart Message (if you have MyChart) OR . A paper copy in the mail If you have any lab test that is abnormal or we need to change your treatment, we will call you to review the results.   Testing/Procedures: None ordered   Follow-Up: At [facility], you and your health needs are our priority.  As part of our continuing mission to provide you with exceptional heart care, we have created designated Provider Care Teams.  These Care Teams include your primary Cardiologist (physician) and Advanced Practice Providers (APPs -  Physician Assistants and Nurse Practitioners) who all work together to provide you with the care you need, when you need it.  We recommend signing up for the patient portal called "MyChart".  Sign up information is provided on this After Visit Summary.  MyChart is used to connect with patients for Virtual Visits (Telemedicine).  Patients are able to view lab/test results, encounter notes, upcoming appointments, etc.  Non-urgent messages can be sent to your provider as well.   To learn more about what you can do with MyChart, go to [email or web address].    Your next appointment:   Follow up with Dr. Sallyanne Kuster or an APP in one month
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
## 2020-08-11 ENCOUNTER — Telehealth: Payer: Self-pay | Admitting: Cardiovascular Disease

## 2020-08-11 ENCOUNTER — Ambulatory Visit: Payer: Medicare Other | Admitting: Cardiovascular Disease

## 2020-08-11 DIAGNOSIS — D509 Iron deficiency anemia, unspecified: Secondary | ICD-10-CM

## 2020-08-11 DIAGNOSIS — I480 Paroxysmal atrial fibrillation: Secondary | ICD-10-CM

## 2020-08-11 NOTE — Telephone Encounter (Signed)
Spoke with pt wife, aware all lab results are not in yet. She is also asking if dr croitoru has talked to dr stark. Will forward to dr croitoru.

## 2020-08-11 NOTE — Telephone Encounter (Signed)
Pt's wife and said that they saw Dr. Sallyanne Kuster yesterday and was told triage would get in contact with them tomorrow but have not heard anything. Wants to know if her husband has to have a blood tranfusion or some thing to take for low iron. Please call back

## 2020-08-12 ENCOUNTER — Other Ambulatory Visit: Payer: Self-pay

## 2020-08-12 ENCOUNTER — Inpatient Hospital Stay (HOSPITAL_COMMUNITY)
Admission: EM | Admit: 2020-08-12 | Discharge: 2020-08-14 | DRG: 812 | Disposition: A | Discharge status: Home / Self Care | Payer: Medicare Other | Attending: Internal Medicine | Admitting: Internal Medicine

## 2020-08-12 ENCOUNTER — Other Ambulatory Visit (HOSPITAL_COMMUNITY): Payer: Self-pay

## 2020-08-12 ENCOUNTER — Encounter (HOSPITAL_COMMUNITY): Payer: Self-pay

## 2020-08-12 ENCOUNTER — Other Ambulatory Visit (HOSPITAL_COMMUNITY): Payer: Medicare Other

## 2020-08-12 DIAGNOSIS — N179 Acute kidney failure, unspecified: Secondary | ICD-10-CM | POA: Diagnosis present

## 2020-08-12 DIAGNOSIS — K449 Diaphragmatic hernia without obstruction or gangrene: Secondary | ICD-10-CM | POA: Diagnosis present

## 2020-08-12 DIAGNOSIS — D500 Iron deficiency anemia secondary to blood loss (chronic): Principal | ICD-10-CM | POA: Diagnosis present

## 2020-08-12 DIAGNOSIS — Z8371 Family history of colonic polyps: Secondary | ICD-10-CM

## 2020-08-12 DIAGNOSIS — I130 Hypertensive heart and chronic kidney disease with heart failure and stage 1 through stage 4 chronic kidney disease, or unspecified chronic kidney disease: Secondary | ICD-10-CM | POA: Diagnosis present

## 2020-08-12 DIAGNOSIS — Z87442 Personal history of urinary calculi: Secondary | ICD-10-CM

## 2020-08-12 DIAGNOSIS — Z8249 Family history of ischemic heart disease and other diseases of the circulatory system: Secondary | ICD-10-CM

## 2020-08-12 DIAGNOSIS — R0602 Shortness of breath: Secondary | ICD-10-CM | POA: Diagnosis not present

## 2020-08-12 DIAGNOSIS — I480 Paroxysmal atrial fibrillation: Secondary | ICD-10-CM | POA: Diagnosis present

## 2020-08-12 DIAGNOSIS — K5750 Diverticulosis of both small and large intestine without perforation or abscess without bleeding: Secondary | ICD-10-CM | POA: Diagnosis present

## 2020-08-12 DIAGNOSIS — G4730 Sleep apnea, unspecified: Secondary | ICD-10-CM | POA: Diagnosis present

## 2020-08-12 DIAGNOSIS — K648 Other hemorrhoids: Secondary | ICD-10-CM | POA: Diagnosis present

## 2020-08-12 DIAGNOSIS — Z20822 Contact with and (suspected) exposure to covid-19: Secondary | ICD-10-CM | POA: Diagnosis present

## 2020-08-12 DIAGNOSIS — Z91048 Other nonmedicinal substance allergy status: Secondary | ICD-10-CM

## 2020-08-12 DIAGNOSIS — M545 Low back pain, unspecified: Secondary | ICD-10-CM | POA: Diagnosis present

## 2020-08-12 DIAGNOSIS — G8929 Other chronic pain: Secondary | ICD-10-CM | POA: Diagnosis present

## 2020-08-12 DIAGNOSIS — N182 Chronic kidney disease, stage 2 (mild): Secondary | ICD-10-CM | POA: Diagnosis present

## 2020-08-12 DIAGNOSIS — Z23 Encounter for immunization: Secondary | ICD-10-CM | POA: Diagnosis not present

## 2020-08-12 DIAGNOSIS — I700 Atherosclerosis of aorta: Secondary | ICD-10-CM | POA: Diagnosis not present

## 2020-08-12 DIAGNOSIS — Z87891 Personal history of nicotine dependence: Secondary | ICD-10-CM

## 2020-08-12 DIAGNOSIS — K644 Residual hemorrhoidal skin tags: Secondary | ICD-10-CM | POA: Diagnosis present

## 2020-08-12 DIAGNOSIS — N400 Enlarged prostate without lower urinary tract symptoms: Secondary | ICD-10-CM | POA: Diagnosis present

## 2020-08-12 DIAGNOSIS — K2270 Barrett's esophagus without dysplasia: Secondary | ICD-10-CM | POA: Diagnosis present

## 2020-08-12 DIAGNOSIS — Z886 Allergy status to analgesic agent status: Secondary | ICD-10-CM

## 2020-08-12 DIAGNOSIS — Z7901 Long term (current) use of anticoagulants: Secondary | ICD-10-CM

## 2020-08-12 DIAGNOSIS — I5032 Chronic diastolic (congestive) heart failure: Secondary | ICD-10-CM | POA: Diagnosis present

## 2020-08-12 DIAGNOSIS — K219 Gastro-esophageal reflux disease without esophagitis: Secondary | ICD-10-CM | POA: Diagnosis present

## 2020-08-12 DIAGNOSIS — Z981 Arthrodesis status: Secondary | ICD-10-CM

## 2020-08-12 DIAGNOSIS — H409 Unspecified glaucoma: Secondary | ICD-10-CM | POA: Diagnosis present

## 2020-08-12 DIAGNOSIS — R195 Other fecal abnormalities: Secondary | ICD-10-CM | POA: Diagnosis present

## 2020-08-12 DIAGNOSIS — E785 Hyperlipidemia, unspecified: Secondary | ICD-10-CM | POA: Diagnosis present

## 2020-08-12 DIAGNOSIS — K2970 Gastritis, unspecified, without bleeding: Secondary | ICD-10-CM | POA: Diagnosis present

## 2020-08-12 DIAGNOSIS — G4733 Obstructive sleep apnea (adult) (pediatric): Secondary | ICD-10-CM | POA: Diagnosis present

## 2020-08-12 DIAGNOSIS — J90 Pleural effusion, not elsewhere classified: Secondary | ICD-10-CM | POA: Diagnosis not present

## 2020-08-12 DIAGNOSIS — Z79899 Other long term (current) drug therapy: Secondary | ICD-10-CM

## 2020-08-12 DIAGNOSIS — M1990 Unspecified osteoarthritis, unspecified site: Secondary | ICD-10-CM | POA: Diagnosis present

## 2020-08-12 DIAGNOSIS — Z86010 Personal history of colonic polyps: Secondary | ICD-10-CM

## 2020-08-12 DIAGNOSIS — K8689 Other specified diseases of pancreas: Secondary | ICD-10-CM | POA: Diagnosis not present

## 2020-08-12 DIAGNOSIS — Z6839 Body mass index (BMI) 39.0-39.9, adult: Secondary | ICD-10-CM

## 2020-08-12 DIAGNOSIS — D649 Anemia, unspecified: Secondary | ICD-10-CM | POA: Diagnosis not present

## 2020-08-12 DIAGNOSIS — Z79891 Long term (current) use of opiate analgesic: Secondary | ICD-10-CM

## 2020-08-12 DIAGNOSIS — Z9049 Acquired absence of other specified parts of digestive tract: Secondary | ICD-10-CM

## 2020-08-12 DIAGNOSIS — I10 Essential (primary) hypertension: Secondary | ICD-10-CM | POA: Diagnosis present

## 2020-08-12 DIAGNOSIS — N281 Cyst of kidney, acquired: Secondary | ICD-10-CM | POA: Diagnosis not present

## 2020-08-12 DIAGNOSIS — D509 Iron deficiency anemia, unspecified: Secondary | ICD-10-CM | POA: Diagnosis not present

## 2020-08-12 DIAGNOSIS — Z885 Allergy status to narcotic agent status: Secondary | ICD-10-CM

## 2020-08-12 DIAGNOSIS — K5730 Diverticulosis of large intestine without perforation or abscess without bleeding: Secondary | ICD-10-CM | POA: Diagnosis not present

## 2020-08-12 LAB — PROTIME-INR
INR: 1.1 (ref 0.8–1.2)
Prothrombin Time: 13.9 seconds (ref 11.4–15.2)

## 2020-08-12 LAB — CBC
HCT: 13.6 % — ABNORMAL LOW (ref 39.0–52.0)
Hematocrit: 12 % — CL (ref 37.5–51.0)
Hematocrit: 12.7 % — CL (ref 37.5–51.0)
Hemoglobin: 3.5 g/dL — CL (ref 13.0–17.7)
Hemoglobin: 3.6 g/dL — CL (ref 13.0–17.7)
Hemoglobin: 3.8 g/dL — CL (ref 13.0–17.0)
MCH: 24.3 pg — ABNORMAL LOW (ref 26.6–33.0)
MCH: 22.9 pg — ABNORMAL LOW (ref 26.6–33.0)
MCH: 24.7 pg — ABNORMAL LOW (ref 26.0–34.0)
MCHC: 29.2 g/dL — ABNORMAL LOW (ref 31.5–35.7)
MCHC: 28.3 g/dL — ABNORMAL LOW (ref 31.5–35.7)
MCHC: 27.9 g/dL — ABNORMAL LOW (ref 30.0–36.0)
MCV: 83 fL (ref 79–97)
MCV: 81 fL (ref 79–97)
MCV: 88.3 fL (ref 80.0–100.0)
NRBC: 2 % — ABNORMAL HIGH (ref 0–0)
Platelets: 394 10*3/uL (ref 150–450)
Platelets: 481 10*3/uL — ABNORMAL HIGH (ref 150–450)
Platelets: 430 10*3/uL — ABNORMAL HIGH (ref 150–400)
RBC: 1.44 x10E6/uL — CL (ref 4.14–5.80)
RBC: 1.57 x10E6/uL — CL (ref 4.14–5.80)
RBC: 1.54 MIL/uL — ABNORMAL LOW (ref 4.22–5.81)
RDW: 18.2 % — ABNORMAL HIGH (ref 11.6–15.4)
RDW: 21.5 % — ABNORMAL HIGH (ref 11.6–15.4)
RDW: 17.7 % — ABNORMAL HIGH (ref 11.5–15.5)
WBC: 5.6 10*3/uL (ref 3.4–10.8)
WBC: 5.1 10*3/uL (ref 3.4–10.8)
WBC: 5.4 10*3/uL (ref 4.0–10.5)
nRBC: 2 % — ABNORMAL HIGH (ref 0.0–0.2)

## 2020-08-12 LAB — RETICULOCYTES
Immature Retic Fract: 11.7 % (ref 2.3–15.9)
RBC.: 1.49 MIL/uL — ABNORMAL LOW (ref 4.22–5.81)
Retic Count, Absolute: 38.2 10*3/uL (ref 19.0–186.0)
Retic Ct Pct: 2.5 % (ref 0.4–3.1)

## 2020-08-12 LAB — COMPREHENSIVE METABOLIC PANEL
ALT: 10 U/L (ref 0–44)
AST: 15 U/L (ref 15–41)
Albumin: 3 g/dL — ABNORMAL LOW (ref 3.5–5.0)
Alkaline Phosphatase: 84 U/L (ref 38–126)
Anion gap: 11 (ref 5–15)
BUN: 11 mg/dL (ref 8–23)
CO2: 25 mmol/L (ref 22–32)
Calcium: 9 mg/dL (ref 8.9–10.3)
Chloride: 109 mmol/L (ref 98–111)
Creatinine, Ser: 1.25 mg/dL — ABNORMAL HIGH (ref 0.61–1.24)
GFR calc Af Amer: >60 mL/min (ref >60)
GFR calc non Af Amer: 56 mL/min — ABNORMAL LOW (ref >60)
Glucose, Bld: 129 mg/dL — ABNORMAL HIGH (ref 70–99)
Potassium: 3.4 mmol/L — ABNORMAL LOW (ref 3.5–5.1)
Sodium: 145 mmol/L (ref 135–145)
Total Bilirubin: 0.6 mg/dL (ref 0.3–1.2)
Total Protein: 5.8 g/dL — ABNORMAL LOW (ref 6.5–8.1)

## 2020-08-12 LAB — URINALYSIS, ROUTINE W REFLEX MICROSCOPIC
Bilirubin Urine: NEGATIVE
Glucose, UA: NEGATIVE mg/dL
Hgb urine dipstick: NEGATIVE
Ketones, ur: NEGATIVE mg/dL
Leukocytes,Ua: NEGATIVE
Nitrite: NEGATIVE
Protein, ur: NEGATIVE mg/dL
Specific Gravity, Urine: 1.01 (ref 1.005–1.030)
pH: 6 (ref 5.0–8.0)

## 2020-08-12 LAB — IRON AND TIBC
Iron Saturation: 2 % — CL (ref 15–55)
Iron: 8 ug/dL — CL (ref 38–169)
Total Iron Binding Capacity: 443 ug/dL (ref 250–450)
UIBC: 435 ug/dL — ABNORMAL HIGH (ref 111–343)

## 2020-08-12 LAB — VITAMIN B12: Vitamin B-12: 831 pg/mL (ref 232–1245)

## 2020-08-12 LAB — PREPARE RBC (CROSSMATCH)

## 2020-08-12 LAB — RESPIRATORY PANEL BY RT PCR (FLU A&B, COVID)
Influenza A by PCR: NEGATIVE
Influenza B by PCR: NEGATIVE
SARS Coronavirus 2 by RT PCR: NEGATIVE

## 2020-08-12 LAB — FOLATE: Folate: >20 ng/mL (ref >3.0)

## 2020-08-12 LAB — MAGNESIUM: Magnesium: 2 mg/dL (ref 1.7–2.4)

## 2020-08-12 LAB — APTT: aPTT: 26 seconds (ref 24–36)

## 2020-08-12 LAB — FERRITIN: Ferritin: 5 ng/mL — ABNORMAL LOW (ref 30–400)

## 2020-08-12 MED ORDER — ONDANSETRON HCL 4 MG/2ML IJ SOLN
4.0000 mg | Freq: Four times a day (QID) | INTRAMUSCULAR | Status: DC | PRN
  Administered 2020-08-14: 4 mg via INTRAVENOUS
  Filled 2020-08-12: qty 2

## 2020-08-12 MED ORDER — ACETAMINOPHEN 650 MG RE SUPP: 650.0000 mg | Freq: Four times a day (QID) | RECTAL | Status: DC | PRN

## 2020-08-12 MED ORDER — OXYCODONE HCL 5 MG PO TABS
15.0000 mg | ORAL_TABLET | Freq: Four times a day (QID) | ORAL | Status: DC | PRN
  Administered 2020-08-12 – 2020-08-14 (×7): 15 mg via ORAL
  Filled 2020-08-12 (×7): qty 3

## 2020-08-12 MED ORDER — SODIUM CHLORIDE 0.9 % IV SOLN
25.0000 mg | Freq: Once | INTRAVENOUS | Status: AC
  Administered 2020-08-12: 25 mg via INTRAVENOUS
  Filled 2020-08-12: qty 0.5

## 2020-08-12 MED ORDER — SODIUM CHLORIDE 0.9 % IV SOLN
1000.0000 mg | Freq: Once | INTRAVENOUS | Status: AC
  Administered 2020-08-13: 1000 mg via INTRAVENOUS
  Filled 2020-08-12 (×2): qty 20

## 2020-08-12 MED ORDER — ATORVASTATIN CALCIUM 10 MG PO TABS
20.0000 mg | ORAL_TABLET | Freq: Every day | ORAL | Status: DC
  Administered 2020-08-13 – 2020-08-14 (×2): 20 mg via ORAL
  Filled 2020-08-12 (×2): qty 2

## 2020-08-12 MED ORDER — METOPROLOL SUCCINATE ER 25 MG PO TB24
25.0000 mg | ORAL_TABLET | Freq: Every day | ORAL | Status: DC
  Administered 2020-08-13 – 2020-08-14 (×2): 25 mg via ORAL
  Filled 2020-08-12 (×2): qty 1

## 2020-08-12 MED ORDER — PANTOPRAZOLE SODIUM 40 MG IV SOLR
40.0000 mg | Freq: Two times a day (BID) | INTRAVENOUS | Status: DC
  Administered 2020-08-12 – 2020-08-13 (×2): 40 mg via INTRAVENOUS
  Filled 2020-08-12 (×2): qty 40

## 2020-08-12 MED ORDER — ONDANSETRON HCL 4 MG PO TABS: 4.0000 mg | ORAL_TABLET | Freq: Four times a day (QID) | ORAL | Status: DC | PRN

## 2020-08-12 MED ORDER — SODIUM CHLORIDE 0.9 % IV SOLN
10.0000 mL/h | Freq: Once | INTRAVENOUS | Status: AC
  Administered 2020-08-12: 10 mL/h via INTRAVENOUS

## 2020-08-12 MED ORDER — DILTIAZEM HCL ER COATED BEADS 240 MG PO CP24
240.0000 mg | ORAL_CAPSULE | Freq: Every day | ORAL | Status: DC
  Administered 2020-08-13 – 2020-08-14 (×2): 240 mg via ORAL
  Filled 2020-08-12 (×2): qty 1

## 2020-08-12 MED ORDER — IRBESARTAN 75 MG PO TABS: 75.0000 mg | ORAL_TABLET | Freq: Every day | ORAL | Status: DC

## 2020-08-12 MED ORDER — FUROSEMIDE 40 MG PO TABS
40.0000 mg | ORAL_TABLET | ORAL | Status: DC
  Administered 2020-08-13 – 2020-08-14 (×2): 40 mg via ORAL
  Filled 2020-08-12 (×2): qty 1

## 2020-08-12 MED ORDER — ACETAMINOPHEN 325 MG PO TABS
650.0000 mg | ORAL_TABLET | Freq: Four times a day (QID) | ORAL | Status: DC | PRN
  Administered 2020-08-13: 650 mg via ORAL
  Filled 2020-08-12: qty 2

## 2020-08-12 NOTE — Telephone Encounter (Signed)
Spoke with the patient's wife. She has been informed the CBC is still pending. Several calls were placed to [facility]. They have stated that the CBC has been sent for a smear and may not be ready until this late afternoon. The office was first told that the STAT labs would be available yesterday and then last night.  The patient's wife will bring the patient back in for a STAT CBC in hopes that the results will be in today.

## 2020-08-12 NOTE — Consult Note (Addendum)
Referring Provider: Dr. Sallyanne Kuster, cardiology Primary Care Physician:  Plotnikov, Evie Lacks, MD Primary Gastroenterologist:  Dr. Fuller Plan  Reason for Consultation:  Iron deficiency nemia  HPI: Cody Frazier is a 74 y.o. male with a hx of essential hypertension, dyslipidemia, paroxysmal atrial fibrillation on eliquis, obstructive sleep apnea, history of iron deficiency anemia, Barrett's esophagus, and remote history gastric ulcers.  He has recently been feeling very poorly.  He becomes short of breath just walking to the bathroom.  Saw cardiology yesterday.  Labs were ordered and revealed a Hgb of 3.6 grams.  He was sent to the ED.  This is compared to last drawn Hgb eight months ago at 14 grams.  Iron studies from a few days ago are extremely low.  He denies abdominal pain for chest pain.  Denies any black or bloody stools.  He says that he's had some diarrhea, but it is brown.  He has been feeling poorly for weeks, actually was feeling poorly at the time of his recent EGD, but has progressed over the past several days to a week.  No NSAID use.  No nausea or vomiting.  He is heme negative here.  BUN is normal.   Recent EGD 07/26/2020 with Dr. Fuller Plan showed the following: - Esophageal mucosal changes secondary to established short-segment Barrett's disease. Biopsied. - No endoscopic esophageal abnormality to explain patient's dysphagia. Esophagus dilated. - Gastritis. Biopsied. - Small hiatal hernia. - A fundoplication was found. The wrap appears loose. - Non-bleeding duodenal diverticulum.  Biopsies showed Barrett's, no dysplasia and no Hpylori.  Last colonoscopy was 03/2017 showed the following:  - Two 6 mm polyps in the transverse colon and in the ascending colon, removed with a cold snare. Resected and retrieved.  Tubular adenomas on pathology. - Moderate diverticulosis in the left colon. - Mild diverticulosis in the ascending colon. - Internal hemorrhoids The examination was otherwise  normal on direct and retroflexion views.  EGD 03/2017 results were essentially the same as recent EGD.  Past Medical History:  Diagnosis Date  . Allergic rhinitis   . Anemia, iron deficiency   . Arthritis   . Atrial arrhythmia   . Atrial fib/flutter, transient    following prior surgeries x 2  . Barrett's esophagus without dysplasia   . BPH (benign prostatic hyperplasia)   . Bright's disease   . Chronic LBP   . Closed fracture of right distal radius   . DDD (degenerative disc disease)   . Diverticulosis of colon   . Duodenal stricture   . Gastric outlet obstruction   . GERD (gastroesophageal reflux disease)   . Glaucoma   . Hemorrhoids   . History of kidney stones   . Hyperlipidemia   . Hyperplastic colonic polyp 01/2000  . Hypertension   . Nephrolithiasis    hx of B  . Peptic ulcer disease with hemorrhage 08/2008   and GOO H. Pylori Ab negative  . Renal cyst   . Sleep apnea with use of continuous positive airway pressure (CPAP)   . Tubular adenoma of colon 03/2012  . Wears glasses     Past Surgical History:  Procedure Laterality Date  . APPENDECTOMY    . BACK SURGERY  02/2003   L5  . BILROTH I PROCEDURE  2011   Dr Johney Maine  . CLOSED REDUCTION WRIST FRACTURE Right 01/06/2019   Procedure: Closed Reduction Wrist/Pinning Wrist;  Surgeon: Dayna Barker, MD;  Location: [place];  Service: Plastics;  Laterality: Right;  . COLONOSCOPY    .  HIATAL HERNIA REPAIR    . LAPAROTOMY N/A 05/10/2017   Procedure: EXPLORATORY LAPAROTOMY WITH ENTEROLYSIS;  Surgeon: Johnathan Hausen, MD;  Location: [facility];  Service: General;  Laterality: N/A;  . LUMBAR FUSION  2009  . SMALL INTESTINE SURGERY    . TONSILLECTOMY    . UPPER GASTROINTESTINAL ENDOSCOPY  07/26/2020    Prior to Admission medications   Medication Sig Start Date End Date Taking? Authorizing Provider  acetaminophen (TYLENOL) 500 MG tablet Take 1,000 mg by mouth every 6 (six) hours as needed for mild pain or moderate pain.     [provider]  atorvastatin (LIPITOR) 20 MG tablet TAKE 1 TABLET BY MOUTH EVERY DAY 04/20/20   Plotnikov, Evie Lacks, MD  cetirizine (ZYRTEC) 10 MG tablet Take 10 mg by mouth daily.    [provider]  Cholecalciferol (VITAMIN D3) 1.25 MG (50000 UT) CAPS TAKE 1 CAPSULE BY MOUTH EVERY 30 (THIRTY) DAYS. 07/20/20   Plotnikov, Evie Lacks, MD  diltiazem (CARDIZEM CD) 240 MG 24 hr capsule TAKE 1 CAPSULE BY MOUTH EVERY DAY 11/25/19   Croitoru, Mihai, MD  ELIQUIS 2.5 MG TABS tablet TAKE 1 TABLET BY MOUTH TWICE A DAY 01/31/20   Plotnikov, Evie Lacks, MD  famotidine (PEPCID) 40 MG tablet Take 1 tablet (40 mg total) by mouth at bedtime. 12/15/19   Ladene Artist, MD  fluticasone (FLONASE) 50 MCG/ACT nasal spray SPRAY 2 SPRAYS INTO EACH NOSTRIL EVERY DAY 10/24/19   Plotnikov, Evie Lacks, MD  furosemide (LASIX) 40 MG tablet Take 1 tablet (40 mg total) by mouth every other day. 08/10/20 11/08/20  Croitoru, Mihai, MD  latanoprost (XALATAN) 0.005 % ophthalmic solution Place 1 drop into both eyes at bedtime. 03/16/17   [provider]  metoprolol succinate (TOPROL-XL) 25 MG 24 hr tablet TAKE 1 TABLET BY MOUTH EVERY DAY 07/20/20   Croitoru, Mihai, MD  Multiple Vitamins-Minerals (CENTRUM SILVER PO) Take by mouth daily.      [provider]  oxyCODONE (ROXICODONE) 15 MG immediate release tablet Take 1 tablet (15 mg total) by mouth 4 (four) times daily. 05/23/20   Plotnikov, Evie Lacks, MD  pantoprazole (PROTONIX) 40 MG tablet Take 1 tablet (40 mg total) by mouth 2 (two) times daily. 12/15/19   Ladene Artist, MD  polyethylene glycol [facility] Floria Raveling) packet Take 17 g by mouth daily. Patient taking differently: Take 17 g by mouth daily as needed for mild constipation.  05/17/17   Dessa Phi, DO  telmisartan (MICARDIS) 20 MG tablet Take 1 tablet (20 mg total) by mouth daily. 07/29/20   Skeet Latch, MD  timolol (TIMOPTIC) 0.25 % ophthalmic solution INSTILL 1 DROP BY OPHTHALMIC ROUTE TWICE  DAILY INTO BOTH EYES 06/07/19   [provider]  triamcinolone cream (KENALOG) 0.5 % Apply 1 application topically 3 (three) times daily. 02/24/19   Plotnikov, Evie Lacks, MD    Current Facility-Administered Medications  Medication Dose Route Frequency Provider Last Rate Last Admin  . 0.9 %  sodium chloride infusion  10 mL/hr Intravenous Once Long, Wonda Olds, MD      . acetaminophen (TYLENOL) tablet 650 mg  650 mg Oral Q6H PRN Pahwani, Rinka R, MD       Or  . acetaminophen (TYLENOL) suppository 650 mg  650 mg Rectal Q6H PRN Pahwani, Rinka R, MD      . ondansetron (ZOFRAN) tablet 4 mg  4 mg Oral Q6H PRN Pahwani, Michell Heinrich, MD       Or  .  ondansetron (ZOFRAN) injection 4 mg  4 mg Intravenous Q6H PRN Pahwani, Rinka R, MD       Current Outpatient Medications  Medication Sig Dispense Refill  . acetaminophen (TYLENOL) 500 MG tablet Take 1,000 mg by mouth every 6 (six) hours as needed for mild pain or moderate pain.    Marland Kitchen atorvastatin (LIPITOR) 20 MG tablet TAKE 1 TABLET BY MOUTH EVERY DAY (Patient taking differently: Take 20 mg by mouth daily. ) 90 tablet 3  . cetirizine (ZYRTEC) 10 MG tablet Take 10 mg by mouth daily.    . Cholecalciferol (VITAMIN D3) 1.25 MG (50000 UT) CAPS TAKE 1 CAPSULE BY MOUTH EVERY 30 (THIRTY) DAYS. (Patient taking differently: Take 1 capsule by mouth every 30 (thirty) days. On the 23rd of each month) 3 capsule 3  . diltiazem (CARDIZEM CD) 240 MG 24 hr capsule TAKE 1 CAPSULE BY MOUTH EVERY DAY (Patient taking differently: Take 240 mg by mouth daily. ) 90 capsule 3  . famotidine (PEPCID) 40 MG tablet Take 1 tablet (40 mg total) by mouth at bedtime. 30 tablet 11  . fluticasone (FLONASE) 50 MCG/ACT nasal spray SPRAY 2 SPRAYS INTO EACH NOSTRIL EVERY DAY (Patient taking differently: Place 2 sprays into both nostrils daily. ) 48 mL 5  . furosemide (LASIX) 40 MG tablet Take 1 tablet (40 mg total) by mouth every other day. 90 tablet 1  . latanoprost (XALATAN) 0.005 % ophthalmic  solution Place 1 drop into both eyes at bedtime.  11  . metoprolol succinate (TOPROL-XL) 25 MG 24 hr tablet TAKE 1 TABLET BY MOUTH EVERY DAY (Patient taking differently: Take 25 mg by mouth daily. ) 90 tablet 2  . oxyCODONE (ROXICODONE) 15 MG immediate release tablet Take 1 tablet (15 mg total) by mouth 4 (four) times daily. (Patient taking differently: Take 15 mg by mouth 2 (two) times daily as needed for pain. ) 120 tablet 0  . pantoprazole (PROTONIX) 40 MG tablet Take 1 tablet (40 mg total) by mouth 2 (two) times daily. 60 tablet 11  . PRESCRIPTION MEDICATION Inhale into the lungs at bedtime. CPAP    . telmisartan (MICARDIS) 20 MG tablet Take 1 tablet (20 mg total) by mouth daily. (Patient taking differently: Take 20 mg by mouth at bedtime. ) 90 tablet 1  . timolol (TIMOPTIC) 0.25 % ophthalmic solution Place 1 drop into both eyes 2 (two) times daily.     Marland Kitchen ELIQUIS 2.5 MG TABS tablet TAKE 1 TABLET BY MOUTH TWICE A DAY (Patient taking differently: Take 2.5 mg by mouth 2 (two) times daily. ) 60 tablet 11  . polyethylene glycol (MIRALAX / GLYCOLAX) packet Take 17 g by mouth daily. (Patient not taking: Reported on 08/12/2020) 14 each 0  . triamcinolone cream (KENALOG) 0.5 % Apply 1 application topically 3 (three) times daily. (Patient not taking: Reported on 08/12/2020) 90 g 1    Allergies as of 08/12/2020 - Review Complete 08/12/2020  Allergen Reaction Noted  . Codeine phosphate Swelling   . Nsaids Other (See Comments) 05/10/2017  . Adhesive [tape] Rash 01/06/2019    Family History  Problem Relation Age of Onset  . Colon polyps Mother   . Colon cancer Mother 78  . Heart disease Mother   . Colon polyps Father   . Colon cancer Father 34  . Prostate cancer Father   . Stroke Father   . Colon cancer Paternal Uncle 41  . Colon cancer Paternal Uncle 95  . Hypertension Other   . Colon  cancer Cousin   . Drug abuse Sister        overdose  . Stomach cancer Neg Hx   . Esophageal cancer Neg Hx     . Pancreatic cancer Neg Hx   . Liver disease Neg Hx     Social History   Socioeconomic History  . Marital status: Married    Spouse name: Cairo Agostinelli  . Number of children: 1  . Years of education: Not on file  . Highest education level: Not on file  Occupational History  . Occupation: PASTOR    Employer: [employer]  Tobacco Use  . Smoking status: Former Research scientist (life sciences)  . Smokeless tobacco: Never Used  . Tobacco comment: as a teenager  Vaping Use  . Vaping Use: Never used  Substance and Sexual Activity  . Alcohol use: No  . Drug use: No  . Sexual activity: Yes  Other Topics Concern  . Not on file  Social History Narrative   Patient gets regular exercise   Married x 42 years   Social Determinants of Health   Financial Resource Strain:   . Difficulty of Paying Living Expenses: Not on file  Food Insecurity:   . Worried About Charity fundraiser in the Last Year: Not on file  . Ran Out of Food in the Last Year: Not on file  Transportation Needs:   . Lack of Transportation (Medical): Not on file  . Lack of Transportation (Non-Medical): Not on file  Physical Activity:   . Days of Exercise per Week: Not on file  . Minutes of Exercise per Session: Not on file  Stress:   . Feeling of Stress : Not on file  Social Connections:   . Frequency of Communication with Friends and Family: Not on file  . Frequency of Social Gatherings with Friends and Family: Not on file  . Attends Religious Services: Not on file  . Active Member of Clubs or Organizations: Not on file  . Attends Archivist Meetings: Not on file  . Marital Status: Not on file  Intimate Partner Violence:   . Fear of Current or Ex-Partner: Not on file  . Emotionally Abused: Not on file  . Physically Abused: Not on file  . Sexually Abused: Not on file    Review of Systems: ROS is O/W negative except as mentioned in HPI.  Physical Exam: Vital signs in last 24 hours: Temp:  [98.2 F (36.8  C)-99.4 F (37.4 C)] 98.2 F (36.8 C) (10/01 1515) Pulse Rate:  [86-93] 92 (10/01 1515) Resp:  [13-18] 13 (10/01 1515) BP: (121-140)/(60-69) 140/64 (10/01 1515) SpO2:  [87 %-100 %] 100 % (10/01 1515)   General:  Alert, Well-developed, well-nourished, pleasant and cooperative in NAD; very pale Head:  Normocephalic and atraumatic. Eyes:  Sclera clear, no icterus.  Conjunctiva pale. Ears:  Normal auditory acuity. Mouth:  No deformity or lesions.   Lungs:  Clear throughout to auscultation.  No wheezes, crackles, or rhonchi.  Heart:  Regular rate and rhythm; SEM noted. Abdomen:  Soft, non-distended.  BS present.  Non-tender.   Rectal:  No external abnormalities noted.  DRE revealed some soft stool in the rectum that was very light brown and hemoccult negative.  Msk:  Symmetrical without gross deformities. Pulses:  Normal pulses noted. Extremities:  B/L LE edema noted. Neurologic:  Alert and oriented x 4;  grossly normal neurologically. Skin:  Intact without significant lesions or rashes. Psych:  Alert and cooperative. Normal mood and affect.  Lab Results: Recent Labs    08/10/20 1300 08/12/20 1011 08/12/20 1345  WBC WILL FOLLOW 5.1 5.4  HGB WILL FOLLOW 3.6* 3.8*  HCT WILL FOLLOW 12.7* 13.6*  PLT WILL FOLLOW 481* 430*   IMPRESSION:  *Profound IDA:  Hgb 3.6 grams as last compared to 14 grams eight months ago.  Iron studies very low.  He is heme negative and denies any sign of bleeding at home.  Had EGD just a couple of weeks ago. *Atrial fibrillation, on Eliquis with last dose 9/29 AM  PLAN: -Needs transfused to at least between 7-7.5 grams. -Recommend IV iron infusion. -Would perform CT scan of the abdomen and pelvis and chest x-ray on 10/2 after he is resuscitated to rule out other issues such as malignancy, RP bleed, etc. -Will consider endoscopic evaluation pending other results. -May need hematology work-up.   Laban Emperor. Zehr  08/12/2020, 3:30  PM   ________________________________________________________________________  Velora Heckler GI MD note:  I personally examined the patient, reviewed the data and agree with the assessment and plan described above.  He's been feeling poorly (DOE, fatigue) for several weeks without any overt bleeding.  Hb is VERY low, and has low iron indices.  He has mild abd tenderness in upper abd but is pain free without palpation.  He's had EGD very recently and colonoscopy 3 years ago and so I recommended that we begin his workup with CXR and CT scan abd/pelvis. He understands if these tests are not fully helpful he will likely need further testing (possibly repeat colonoscopy +/- SB enteroscopy or capsule).     Owens Loffler, MD [facility] Pager [phone]

## 2020-08-12 NOTE — ED Triage Notes (Signed)
Pt arrives to ED after being sent from PCP d/t hgb of 3.2. Pt has hx of anemia secondary to bleeding ulcers. Pt pale in triage and c/o lethargy, weakness. Pt also hx afib and currently on eliquis.

## 2020-08-12 NOTE — ED Provider Notes (Signed)
Emergency Department Provider Note   I have reviewed the triage vital signs and the nursing notes.   HISTORY  Chief Complaint Abnormal Lab   HPI Cody Frazier is a 74 y.o. male with past medical history reviewed below including atrial fibrillation on Eliquis presents to the emergency department with symptomatic anemia and an outpatient hemoglobin of 3.2.  Patient was sent in by his cardiologist to evaluate the patient with 2 to 3 weeks of generalized weakness and shortness of breath.  He has not had syncope.  He has had some diarrhea but denies any black or bright red blood in his bowel movements.  He is not been vomiting blood.  He does have history of peptic ulcer disease and is followed by with our GI.  Denies abdominal pain or chest pain.  No radiation of symptoms or other modifying factors.    Past Medical History:  Diagnosis Date   Allergic rhinitis    Anemia, iron deficiency    Arthritis    Atrial arrhythmia    Atrial fib/flutter, transient    following prior surgeries x 2   Barrett's esophagus without dysplasia    BPH (benign prostatic hyperplasia)    Bright's disease    Chronic LBP    Closed fracture of right distal radius    DDD (degenerative disc disease)    Diverticulosis of colon    Duodenal stricture    Gastric outlet obstruction    GERD (gastroesophageal reflux disease)    Glaucoma    Hemorrhoids    History of kidney stones    Hyperlipidemia    Hyperplastic colonic polyp 01/2000   Hypertension    Nephrolithiasis    hx of B   Peptic ulcer disease with hemorrhage 08/2008   and GOO H. Pylori Ab negative   Renal cyst    Sleep apnea with use of continuous positive airway pressure (CPAP)    Tubular adenoma of colon 03/2012   Wears glasses     Patient Active Problem List   Diagnosis Date Noted   Aortic valve stenosis, nonrheumatic 08/10/2020   PAH (pulmonary artery hypertension) ([place]) 08/10/2020   Latrice Storlie term (current) use  of anticoagulants 08/10/2020   Essential hypertension 08/10/2020   Vitamin D deficiency 09/08/2019   Edema 06/20/2019   Foot deformity 06/17/2019   OSA on CPAP 05/27/2019   Wrist fracture, closed 02/24/2019   Pulmonary hypertension ([place]) 01/01/2019   Obesity 05/19/2018   Paroxysmal atrial fibrillation ([place]) 05/15/2017   Atherosclerosis of aorta ([place]) 05/15/2017   Pressure injury of skin 05/15/2017   Internal hernia 05/10/2017   DOE (dyspnea on exertion) 06/12/2016   Warts 05/26/2015   Neoplasm of uncertain behavior of skin 03/01/2013   History of nephrolithiasis 08/19/2012   Rash 08/13/2011   Cerumen impaction 02/09/2011   Anxiety state 10/20/2010   SBO (small bowel obstruction) ([place]) 01/14/2009   Upper gastrointestinal bleeding 08/12/2008   History of upper gastrointestinal bleeding 08/12/2008   GERD 09/11/2007   Dyslipidemia 06/09/2007   Iron deficiency anemia 06/09/2007   Hypertensive heart disease without CHF 06/09/2007   Lumbar disc disease 06/09/2007    Past Surgical History:  Procedure Laterality Date   APPENDECTOMY     BACK SURGERY  02/2003   L5   BILROTH I PROCEDURE  2011   Dr Johney Maine   CLOSED REDUCTION WRIST FRACTURE Right 01/06/2019   Procedure: Closed Reduction Wrist/Pinning Wrist;  Surgeon: Dayna Barker, MD;  Location: [place];  Service: Plastics;  Laterality: Right;  COLONOSCOPY     HIATAL HERNIA REPAIR     LAPAROTOMY N/A 05/10/2017   Procedure: EXPLORATORY LAPAROTOMY WITH ENTEROLYSIS;  Surgeon: Johnathan Hausen, MD;  Location: [facility];  Service: General;  Laterality: N/A;   LUMBAR FUSION  2009   SMALL INTESTINE SURGERY     TONSILLECTOMY     UPPER GASTROINTESTINAL ENDOSCOPY  07/26/2020    Allergies Codeine phosphate, Nsaids, and Adhesive [tape]  Family History  Problem Relation Age of Onset   Colon polyps Mother    Colon cancer Mother 12   Heart disease Mother    Colon polyps Father    Colon cancer Father 51     Prostate cancer Father    Stroke Father    Colon cancer Paternal Uncle 59   Colon cancer Paternal Uncle 4   Hypertension Other    Colon cancer Cousin    Drug abuse Sister        overdose   Stomach cancer Neg Hx    Esophageal cancer Neg Hx    Pancreatic cancer Neg Hx    Liver disease Neg Hx     Social History Social History   Tobacco Use   Smoking status: Former Smoker   Smokeless tobacco: Never Used   Tobacco comment: as a teenager  Scientific laboratory technician Use: Never used  Substance Use Topics   Alcohol use: No   Drug use: No    Review of Systems  Constitutional: No fever/chills. Positive generalized weakness.  Eyes: No visual changes. ENT: No sore throat. Cardiovascular: Denies chest pain. Respiratory: Positive shortness of breath. Gastrointestinal: No abdominal pain.  No nausea, no vomiting.  No diarrhea.  No constipation. Genitourinary: Negative for dysuria. Musculoskeletal: Negative for back pain. Skin: Negative for rash. Neurological: Negative for headaches, focal weakness or numbness.  10-point ROS otherwise negative.  ____________________________________________   PHYSICAL EXAM:  VITAL SIGNS: ED Triage Vitals  Enc Vitals Group     BP 08/12/20 1324 131/60     Pulse Rate 08/12/20 1324 93     Resp 08/12/20 1324 15     Temp 08/12/20 1324 99.4 F (37.4 C)     Temp Source 08/12/20 1324 Oral     SpO2 08/12/20 1324 100 %   Constitutional: Alert and oriented. Well appearing and in no acute distress. Eyes: Conjunctivae are normal. Head: Atraumatic. Nose: No congestion/rhinnorhea. Mouth/Throat: Mucous membranes are moist.   Neck: No stridor. Cardiovascular: Normal rate, regular rhythm. Good peripheral circulation. Grossly normal heart sounds.   Respiratory: Normal respiratory effort.  No retractions. Lungs CTAB. Gastrointestinal: Soft and nontender. No distention. NO gross blood or melena on DRE. Hemoccult negative.  Musculoskeletal: No  lower extremity tenderness nor edema. No gross deformities of extremities. Neurologic:  Normal speech and language. No gross focal neurologic deficits are appreciated.  Skin:  Skin is warm, dry and intact. No rash noted.   ____________________________________________   LABS (all labs ordered are listed, but only abnormal results are displayed)  Labs Reviewed  CBC - Abnormal; Notable for the following components:      Result Value   RBC 1.54 (*)    Hemoglobin 3.8 (*)    HCT 13.6 (*)    MCH 24.7 (*)    MCHC 27.9 (*)    RDW 17.7 (*)    Platelets 430 (*)    nRBC 2.0 (*)    All other components within normal limits  RESPIRATORY PANEL BY RT PCR (FLU A&B, COVID)  COMPREHENSIVE METABOLIC PANEL  VITAMIN B12  FOLATE  IRON AND TIBC  FERRITIN  RETICULOCYTES  POC OCCULT BLOOD, ED  TYPE AND SCREEN  PREPARE RBC (CROSSMATCH)   _________________________________________ ____________________________________________   PROCEDURES  Procedure(s) performed:   Procedures  CRITICAL CARE Performed by: Margette Fast Total critical care time: 35 minutes Critical care time was exclusive of separately billable procedures and treating other patients. Critical care was necessary to treat or prevent imminent or life-threatening deterioration. Critical care was time spent personally by me on the following activities: development of treatment plan with patient and/or surrogate as well as nursing, discussions with consultants, evaluation of patient's response to treatment, examination of patient, obtaining history from patient or surrogate, ordering and performing treatments and interventions, ordering and review of laboratory studies, ordering and review of radiographic studies, pulse oximetry and re-evaluation of patient's condition.  Nanda Quinton, MD Emergency Medicine  ____________________________________________   INITIAL IMPRESSION / ASSESSMENT AND PLAN / ED COURSE  Pertinent labs & imaging  results that were available during my care of the patient were reviewed by me and considered in my medical decision making (see chart for details).   Patient arrives to the emergency department with hemoglobin around 3 from his cardiologist.  I spoke with his cardiologist on the phone prior to his arrival and the patient was examined in the emergency department in conjunction with the bowel or GI who also met him here.  Consented the patient for blood transfusion and ordered 3 units to start.  Follow GI recommendations in the chart.  Patient is hemodynamically stable despite his hemoglobin. Plan for admit.   Discussed patient's case with [facility] to request admission. Patient and family (if present) updated with plan. Care transferred to [facility] service.  I reviewed all nursing notes, vitals, pertinent old records, EKGs, labs, imaging (as available).  ____________________________________________  FINAL CLINICAL IMPRESSION(S) / ED DIAGNOSES  Final diagnoses:  Symptomatic anemia     MEDICATIONS GIVEN DURING THIS VISIT:  Medications  0.9 %  sodium chloride infusion (has no administration in time range)     Note:  This document was prepared using Dragon voice recognition software and may include unintentional dictation errors.  Nanda Quinton, MD, [facility] Emergency Medicine    Long, Wonda Olds, MD 08/17/20 [phone]

## 2020-08-12 NOTE — Telephone Encounter (Signed)
Left a message for the patient to call back.  

## 2020-08-12 NOTE — H&P (Signed)
History and Physical    Cody Frazier:[id number] DOB: 1945-12-04 DOA: 08/12/2020  PCP: Cassandria Anger, MD  Patient coming from: Home  I have personally briefly reviewed patient's old medical records in [place]  Chief Complaint: Low hemoglobin HPI: Cody Frazier is a 74 y.o. male with medical history significant of paroxysmal A. fib-on Eliquis, obstructive sleep apnea, iron deficiency anemia, hypertension, hyperlipidemia,  presents to emergency department for evaluation of low hemoglobin.  Patient reports worsening shortness of breath especially with exertion, generalized weakness, fatigue and has no energy.  He was seen by cardiologist yesterday and labs were ordered which showed hemoglobin of 3.6 and was sent to ED for further evaluation and management.  He has history of gastritis/reactive gastropathy -recent EGD done on 07/26/2020-which shows short segment Barrett's disease.  Gastritis, small hiatal hernia, nonbleeding duodenal diverticulum.  He denies epigastric burning, over-the-counter use of NSAIDs, decreased appetite, weight loss, headache, blurry vision, chest pain, palpitations, fever, chills, nausea, vomiting, melena, hematemesis.  Reports worsening leg swelling since 2 to 3 weeks.  Compliant with diuretics.  Denies orthopnea or PND.  His last Eliquis dose was on 9/29.  ED Course: Upon arrival to ED: Patient's vital signs stable.  Maintaining oxygen saturation on room air, CBC shows hemoglobin of 38/13.6, platelet: 481, CMP shows potassium of 3.4, CKD stage II-at baseline.  EDP ordered 3 unit PRBC.  Consulted GI.  POC occult blood negative.  [facility] hospitalist consulted for admission for symptomatic anemia.  Review of Systems: As per HPI otherwise negative.    Past Medical History:  Diagnosis Date  . Allergic rhinitis   . Anemia, iron deficiency   . Arthritis   . Atrial arrhythmia   . Atrial fib/flutter, transient    following prior surgeries x 2  .  Barrett's esophagus without dysplasia   . BPH (benign prostatic hyperplasia)   . Bright's disease   . Chronic LBP   . Closed fracture of right distal radius   . DDD (degenerative disc disease)   . Diverticulosis of colon   . Duodenal stricture   . Gastric outlet obstruction   . GERD (gastroesophageal reflux disease)   . Glaucoma   . Hemorrhoids   . History of kidney stones   . Hyperlipidemia   . Hyperplastic colonic polyp 01/2000  . Hypertension   . Nephrolithiasis    hx of B  . Peptic ulcer disease with hemorrhage 08/2008   and GOO H. Pylori Ab negative  . Renal cyst   . Sleep apnea with use of continuous positive airway pressure (CPAP)   . Tubular adenoma of colon 03/2012  . Wears glasses     Past Surgical History:  Procedure Laterality Date  . APPENDECTOMY    . BACK SURGERY  02/2003   L5  . BILROTH I PROCEDURE  2011   Dr Johney Maine  . CLOSED REDUCTION WRIST FRACTURE Right 01/06/2019   Procedure: Closed Reduction Wrist/Pinning Wrist;  Surgeon: Dayna Barker, MD;  Location: [facility];  Service: Plastics;  Laterality: Right;  . COLONOSCOPY    . HIATAL HERNIA REPAIR    . LAPAROTOMY N/A 05/10/2017   Procedure: EXPLORATORY LAPAROTOMY WITH ENTEROLYSIS;  Surgeon: Johnathan Hausen, MD;  Location: [facility];  Service: General;  Laterality: N/A;  . LUMBAR FUSION  2009  . SMALL INTESTINE SURGERY    . TONSILLECTOMY    . UPPER GASTROINTESTINAL ENDOSCOPY  07/26/2020     reports that he has quit smoking. He has never used  smokeless tobacco. He reports that he does not drink alcohol and does not use drugs.  Allergies  Allergen Reactions  . Codeine Phosphate Swelling  . Nsaids Other (See Comments)    ulcers  . Adhesive [Tape] Rash    ekg strips    Family History  Problem Relation Age of Onset  . Colon polyps Mother   . Colon cancer Mother 63  . Heart disease Mother   . Colon polyps Father   . Colon cancer Father 54  . Prostate cancer Father   . Stroke Father   . Colon cancer  Paternal Uncle 85  . Colon cancer Paternal Uncle 51  . Hypertension Other   . Colon cancer Cousin   . Drug abuse Sister        overdose  . Stomach cancer Neg Hx   . Esophageal cancer Neg Hx   . Pancreatic cancer Neg Hx   . Liver disease Neg Hx     Prior to Admission medications   Medication Sig Start Date End Date Taking? Authorizing Provider  acetaminophen (TYLENOL) 500 MG tablet Take 1,000 mg by mouth every 6 (six) hours as needed for mild pain or moderate pain.    [provider]  atorvastatin (LIPITOR) 20 MG tablet TAKE 1 TABLET BY MOUTH EVERY DAY 04/20/20   Plotnikov, Evie Lacks, MD  cetirizine (ZYRTEC) 10 MG tablet Take 10 mg by mouth daily.    [provider]  Cholecalciferol (VITAMIN D3) 1.25 MG (50000 UT) CAPS TAKE 1 CAPSULE BY MOUTH EVERY 30 (THIRTY) DAYS. 07/20/20   Plotnikov, Evie Lacks, MD  diltiazem (CARDIZEM CD) 240 MG 24 hr capsule TAKE 1 CAPSULE BY MOUTH EVERY DAY 11/25/19   Croitoru, Mihai, MD  ELIQUIS 2.5 MG TABS tablet TAKE 1 TABLET BY MOUTH TWICE A DAY 01/31/20   Plotnikov, Evie Lacks, MD  famotidine (PEPCID) 40 MG tablet Take 1 tablet (40 mg total) by mouth at bedtime. 12/15/19   Ladene Artist, MD  fluticasone (FLONASE) 50 MCG/ACT nasal spray SPRAY 2 SPRAYS INTO EACH NOSTRIL EVERY DAY 10/24/19   Plotnikov, Evie Lacks, MD  furosemide (LASIX) 40 MG tablet Take 1 tablet (40 mg total) by mouth every other day. 08/10/20 11/08/20  Croitoru, Mihai, MD  latanoprost (XALATAN) 0.005 % ophthalmic solution Place 1 drop into both eyes at bedtime. 03/16/17   [provider]  metoprolol succinate (TOPROL-XL) 25 MG 24 hr tablet TAKE 1 TABLET BY MOUTH EVERY DAY 07/20/20   Croitoru, Mihai, MD  Multiple Vitamins-Minerals (CENTRUM SILVER PO) Take by mouth daily.      [provider]  oxyCODONE (ROXICODONE) 15 MG immediate release tablet Take 1 tablet (15 mg total) by mouth 4 (four) times daily. 05/23/20   Plotnikov, Evie Lacks, MD  pantoprazole (PROTONIX) 40 MG tablet  Take 1 tablet (40 mg total) by mouth 2 (two) times daily. 12/15/19   Ladene Artist, MD  polyethylene glycol [facility] Floria Raveling) packet Take 17 g by mouth daily. Patient taking differently: Take 17 g by mouth daily as needed for mild constipation.  05/17/17   Dessa Phi, DO  telmisartan (MICARDIS) 20 MG tablet Take 1 tablet (20 mg total) by mouth daily. 07/29/20   Skeet Latch, MD  timolol (TIMOPTIC) 0.25 % ophthalmic solution INSTILL 1 DROP BY OPHTHALMIC ROUTE TWICE DAILY INTO BOTH EYES 06/07/19   [provider]  triamcinolone cream (KENALOG) 0.5 % Apply 1 application topically 3 (three) times daily. 02/24/19   Plotnikov, Evie Lacks, MD  Physical Exam: Vitals:   08/12/20 1324 08/12/20 1415  BP: 131/60 121/69  Pulse: 93 93  Resp: 15 17  Temp: 99.4 F (37.4 C)   TempSrc: Oral   SpO2: 100% (!) 87%    Constitutional: NAD, calm, comfortable-on room air Eyes: PERRL, lids and conjunctivae normal ENMT: Mucous membranes are moist. Posterior pharynx clear of any exudate or lesions.Normal dentition.  Neck: normal, supple, no masses, no thyromegaly Respiratory: clear to auscultation bilaterally, no wheezing, no crackles. Normal respiratory effort. No accessory muscle use.  Cardiovascular: Regular rate and rhythm, no murmurs / rubs / gallops.  Bilateral 2+ pitting edema positive. 2+ pedal pulses. No carotid bruits.  Abdomen: no tenderness, no masses palpated. No hepatosplenomegaly. Bowel sounds positive.  Musculoskeletal: no clubbing / cyanosis. No joint deformity upper and lower extremities. Good ROM, no contractures. Normal muscle tone.  Skin: no rashes, lesions, ulcers. No induration Neurologic: CN 2-12 grossly intact. Sensation intact, DTR normal. Strength 5/5 in all 4.  Psychiatric: Normal judgment and insight. Alert and oriented x 3. Normal mood.    Labs on Admission: I have personally reviewed following labs and imaging studies  CBC: Recent Labs  Lab 08/10/20 1300  08/12/20 1011 08/12/20 1345  WBC WILL FOLLOW 5.1 5.4  HGB WILL FOLLOW 3.6* 3.8*  HCT WILL FOLLOW 12.7* 13.6*  MCV WILL FOLLOW 81 88.3  PLT WILL FOLLOW 481* 063*   Basic Metabolic Panel: Recent Labs  Lab 08/08/20 1408 08/12/20 1345  NA 145* 145  K 3.9 3.4*  CL 108* 109  CO2 22 25  GLUCOSE 108* 129*  BUN 14 11  CREATININE 1.28* 1.25*  CALCIUM 9.0 9.0   GFR: Estimated Creatinine Clearance: 60.6 mL/min (A) (by C-G formula based on SCr of 1.25 mg/dL (H)). Liver Function Tests: Recent Labs  Lab 08/12/20 1345  AST 15  ALT 10  ALKPHOS 84  BILITOT 0.6  PROT 5.8*  ALBUMIN 3.0*   No results for input(s): LIPASE, AMYLASE in the last 168 hours. No results for input(s): AMMONIA in the last 168 hours. Coagulation Profile: No results for input(s): INR, PROTIME in the last 168 hours. Cardiac Enzymes: No results for input(s): CKTOTAL, CKMB, CKMBINDEX, TROPONINI in the last 168 hours. BNP (last 3 results) No results for input(s): PROBNP in the last 8760 hours. HbA1C: No results for input(s): HGBA1C in the last 72 hours. CBG: No results for input(s): GLUCAP in the last 168 hours. Lipid Profile: No results for input(s): CHOL, HDL, LDLCALC, TRIG, CHOLHDL, LDLDIRECT in the last 72 hours. Thyroid Function Tests: No results for input(s): TSH, T4TOTAL, FREET4, T3FREE, THYROIDAB in the last 72 hours. Anemia Panel: Recent Labs    08/10/20 1300  VITAMINB12 831  FOLATE >20.0  FERRITIN 5*  TIBC 443  IRON 8*   Urine analysis:    Component Value Date/Time   COLORURINE YELLOW 11/20/2018 1532   APPEARANCEUR CLEAR 11/20/2018 1532   LABSPEC 1.015 11/20/2018 1532   PHURINE 6.0 11/20/2018 1532   GLUCOSEU NEGATIVE 11/20/2018 1532   HGBUR NEGATIVE 11/20/2018 [place] 11/20/2018 1532   KETONESUR NEGATIVE 11/20/2018 1532   PROTEINUR 30 (A) 05/10/2017 1414   UROBILINOGEN 0.2 11/20/2018 1532   NITRITE NEGATIVE 11/20/2018 1532   LEUKOCYTESUR NEGATIVE 11/20/2018 1532      Radiological Exams on Admission: No results found.  EKG: Independently reviewed.  Sinus rhythm.  No ST elevation or depression noted. Assessment/Plan Principal Problem:   Symptomatic anemia Active Problems:   Dyslipidemia   Paroxysmal atrial  fibrillation ([place])   Essential hypertension   Symptomatic anemia: - Patient has history of underlying gastric ulcers.  Reviewed EGD from 9/14.  Presented with low hemoglobin of 3.8/13.6.  POC occult blood negative.  Patient's vital signs stable.  COVID-19 pending. -Admit patient on the floor for close monitoring.  EDP ordered 3 unit PRBC. -Iron panel ordered and is pending. -Monitor H&H closely.  Hold Eliquis.  Keep hemoglobin more than 8 -EDP consulted GI-recommend CT scan of abdomen and pelvis on 10/2 along with chest x-ray to rule out other causes of low hemoglobin-if it is negative he will likely need colonoscopy +/- SB enteroscopy or capsule. -Ordered IV iron -Start patient on Protonix IV-considering history of gastritis.  Chronic diastolic CHF: -Appears euvolemic.  Continue home meds statin, metoprolol, Lasix every other day.  Strict INO's and daily weight.  Monitor electrolytes.  Monitor signs for fluid overload.  Reviewed echo from 9/22 which showed ejection fraction of 60 to 65% with grade 1 diastolic dysfunction.  Mild AKI: -Likely secondary to prerenal due to low hemoglobin.  Hold nephrotoxic medication.  Monitor kidney function closely.  Hypertension: Stable -Continue metoprolol hold telmisartan.  Monitor blood pressure closely  Hyperlipidemia: Continue statin  Paroxysmal A. fib: Rate controlled.  Continue metoprolol and Cardizem.  Hold Eliquis.  Monitor closely on telemetry.  Morbid obesity with BMI of 39: -Diet modification/exercise and weight loss recommended  DVT prophylaxis: SCD Code Status: Full code Family Communication: None present at bedside.  Plan of care discussed with patient in length and he verbalized  understanding and agreed with it. Disposition Plan: Home in 2 to 3 days Consults called: GI Admission status: Inpatient  Mckinley Jewel MD [facility]  If 7PM-7AM, please contact night-coverage [email or web address] Password [email or web address]  08/12/2020, 2:57 PM

## 2020-08-12 NOTE — ED Notes (Signed)
s1 s2 audible but with murmer

## 2020-08-12 NOTE — ED Notes (Signed)
Checked with Dr. Doristine Bosworth  In ref.to pt request for Pain medication and blood Admin. Pt's Vitals look good and was given ok to give pain medication. The amount is same as he usually takes at home.

## 2020-08-12 NOTE — ED Notes (Signed)
Pt looks better and stated that he was feeling well, other than back pain that is chronic

## 2020-08-12 NOTE — ED Notes (Signed)
Rate dose change to 150.

## 2020-08-13 ENCOUNTER — Inpatient Hospital Stay (HOSPITAL_COMMUNITY): Payer: Medicare Other

## 2020-08-13 ENCOUNTER — Encounter (HOSPITAL_COMMUNITY): Payer: Self-pay | Admitting: Internal Medicine

## 2020-08-13 DIAGNOSIS — D649 Anemia, unspecified: Secondary | ICD-10-CM | POA: Diagnosis not present

## 2020-08-13 LAB — FOLATE: Folate: 26.2 ng/mL (ref >5.9)

## 2020-08-13 LAB — HEMOGLOBIN AND HEMATOCRIT, BLOOD
HCT: 24.5 % — ABNORMAL LOW (ref 39.0–52.0)
Hemoglobin: 7.8 g/dL — ABNORMAL LOW (ref 13.0–17.0)

## 2020-08-13 LAB — COMPREHENSIVE METABOLIC PANEL
ALT: 7 U/L (ref 0–44)
AST: 28 U/L (ref 15–41)
Albumin: 2.8 g/dL — ABNORMAL LOW (ref 3.5–5.0)
Alkaline Phosphatase: 69 U/L (ref 38–126)
Anion gap: 11 (ref 5–15)
BUN: 11 mg/dL (ref 8–23)
CO2: 24 mmol/L (ref 22–32)
Calcium: 8.8 mg/dL — ABNORMAL LOW (ref 8.9–10.3)
Chloride: 108 mmol/L (ref 98–111)
Creatinine, Ser: 1.15 mg/dL (ref 0.61–1.24)
GFR calc Af Amer: >60 mL/min (ref >60)
GFR calc non Af Amer: >60 mL/min (ref >60)
Glucose, Bld: 104 mg/dL — ABNORMAL HIGH (ref 70–99)
Potassium: 3.8 mmol/L (ref 3.5–5.1)
Sodium: 143 mmol/L (ref 135–145)
Total Bilirubin: 0.9 mg/dL (ref 0.3–1.2)
Total Protein: 5.4 g/dL — ABNORMAL LOW (ref 6.5–8.1)

## 2020-08-13 LAB — CBC
HCT: 21.2 % — ABNORMAL LOW (ref 39.0–52.0)
Hemoglobin: 6.5 g/dL — CL (ref 13.0–17.0)
MCH: 27.1 pg (ref 26.0–34.0)
MCHC: 30.7 g/dL (ref 30.0–36.0)
MCV: 88.3 fL (ref 80.0–100.0)
Platelets: 338 10*3/uL (ref 150–400)
RBC: 2.4 MIL/uL — ABNORMAL LOW (ref 4.22–5.81)
RDW: 15.5 % (ref 11.5–15.5)
WBC: 4.7 10*3/uL (ref 4.0–10.5)
nRBC: 1.9 % — ABNORMAL HIGH (ref 0.0–0.2)

## 2020-08-13 LAB — PREPARE RBC (CROSSMATCH)

## 2020-08-13 LAB — MRSA PCR SCREENING: MRSA by PCR: NEGATIVE

## 2020-08-13 LAB — IRON AND TIBC
Iron: 18 ug/dL — ABNORMAL LOW (ref 45–182)
Saturation Ratios: 3 % — ABNORMAL LOW (ref 17.9–39.5)
TIBC: 517 ug/dL — ABNORMAL HIGH (ref 250–450)
UIBC: 499 ug/dL

## 2020-08-13 LAB — VITAMIN B12: Vitamin B-12: 759 pg/mL (ref 180–914)

## 2020-08-13 LAB — FERRITIN: Ferritin: 7 ng/mL — ABNORMAL LOW (ref 24–336)

## 2020-08-13 MED ORDER — PEG-KCL-NACL-NASULF-NA ASC-C 100 G PO SOLR
0.5000 | Freq: Once | ORAL | Status: AC
  Administered 2020-08-14: 100 g via ORAL
  Filled 2020-08-13: qty 1

## 2020-08-13 MED ORDER — PANTOPRAZOLE SODIUM 40 MG PO TBEC
40.0000 mg | DELAYED_RELEASE_TABLET | Freq: Two times a day (BID) | ORAL | Status: DC
  Administered 2020-08-13 – 2020-08-14 (×2): 40 mg via ORAL
  Filled 2020-08-13 (×2): qty 1

## 2020-08-13 MED ORDER — DIPHENHYDRAMINE HCL 50 MG/ML IJ SOLN
25.0000 mg | Freq: Once | INTRAMUSCULAR | Status: AC
  Administered 2020-08-13: 25 mg via INTRAVENOUS
  Filled 2020-08-13: qty 1

## 2020-08-13 MED ORDER — PEG-KCL-NACL-NASULF-NA ASC-C 100 G PO SOLR
0.5000 | Freq: Once | ORAL | Status: AC
  Administered 2020-08-13: 100 g via ORAL
  Filled 2020-08-13: qty 1

## 2020-08-13 MED ORDER — FUROSEMIDE 10 MG/ML IJ SOLN
20.0000 mg | Freq: Once | INTRAMUSCULAR | Status: AC
  Administered 2020-08-13: 20 mg via INTRAVENOUS
  Filled 2020-08-13: qty 2

## 2020-08-13 MED ORDER — IOHEXOL 300 MG/ML  SOLN
100.0000 mL | Freq: Once | INTRAMUSCULAR | Status: AC | PRN
  Administered 2020-08-13: 100 mL via INTRAVENOUS

## 2020-08-13 MED ORDER — SODIUM CHLORIDE 0.9% IV SOLUTION: Freq: Once | INTRAVENOUS | Status: AC

## 2020-08-13 MED ORDER — INFLUENZA VAC A&B SA ADJ QUAD 0.5 ML IM PRSY
0.5000 mL | PREFILLED_SYRINGE | INTRAMUSCULAR | Status: AC
  Administered 2020-08-14: 0.5 mL via INTRAMUSCULAR
  Filled 2020-08-13: qty 0.5

## 2020-08-13 MED ORDER — ACETAMINOPHEN 325 MG PO TABS
650.0000 mg | ORAL_TABLET | Freq: Once | ORAL | Status: AC
  Administered 2020-08-13: 650 mg via ORAL
  Filled 2020-08-13: qty 2

## 2020-08-13 MED ORDER — PEG-KCL-NACL-NASULF-NA ASC-C 100 G PO SOLR: 1.0000 | Freq: Two times a day (BID) | ORAL | Status: DC

## 2020-08-13 MED ORDER — SODIUM CHLORIDE 0.9 % IV SOLN: INTRAVENOUS | Status: DC

## 2020-08-13 MED ORDER — IOHEXOL 9 MG/ML PO SOLN
500.0000 mL | ORAL | Status: AC
  Administered 2020-08-13 (×2): 500 mL via ORAL

## 2020-08-13 NOTE — Progress Notes (Signed)
[facility] Progress Note    Since last GI note: Slept OK last night.  Tolerating liquid diet. A bit of nausea but no vomiting and no BMs since admit. Feels overall better since getting blood tranfusions.  Objective: Vital signs in last 24 hours: Temp:  [98.1 F (36.7 C)-99.4 F (37.4 C)] 98.3 F (36.8 C) (10/02 0742) Pulse Rate:  [76-93] 80 (10/02 0800) Resp:  [13-23] 19 (10/02 0800) BP: (116-152)/(57-83) 151/75 (10/02 0800) SpO2:  [87 %-100 %] 92 % (10/02 0800) Weight:  [111.2 kg-112.2 kg] 112.2 kg (10/02 0500) Last BM Date: 08/12/20 General: alert and oriented times 3 Heart: regular rate and rythm Abdomen: soft, non-tender, non-distended, normal bowel sounds  Lab Results: Recent Labs    08/12/20 1011 08/12/20 1345 08/13/20 0146  WBC 5.1 5.4 4.7  HGB 3.6* 3.8* 6.5*  PLT 481* 430* 338  MCV 81 88.3 88.3   Recent Labs    08/12/20 1345 08/13/20 0057  NA 145 143  K 3.4* 3.8  CL 109 108  CO2 25 24  GLUCOSE 129* 104*  BUN 11 11  CREATININE 1.25* 1.15  CALCIUM 9.0 8.8*   Recent Labs    08/12/20 1345 08/13/20 0057  PROT 5.8* 5.4*  ALBUMIN 3.0* 2.8*  AST 15 28  ALT 10 7  ALKPHOS 84 69  BILITOT 0.6 0.9   Recent Labs    08/12/20 2237  INR 1.1     Medications: Scheduled Meds: . atorvastatin  20 mg Oral Daily  . diltiazem  240 mg Oral Daily  . furosemide  20 mg Intravenous Once  . furosemide  40 mg Oral QODAY  . [START ON 08/14/2020] influenza vaccine adjuvanted  0.5 mL Intramuscular Tomorrow-1000  . metoprolol succinate  25 mg Oral Daily  . pantoprazole (PROTONIX) IV  40 mg Intravenous Q12H   Continuous Infusions: PRN Meds:.acetaminophen **OR** acetaminophen, ondansetron **OR** ondansetron (ZOFRAN) IV, oxyCODONE   Assessment/Plan: 74 y.o. male with severe IDA without overt bleeding of any kind  EGD 07/2020, Colonoscopy 03/2017.    He is getting his 4th unit of PRBC now and I am going to order CXR and CT scan abd/pelvis to begin the  workup of his severe IDA. He understands he may need further testing after that.  I am changing him to oral PPI (he is not overtly GI bleeding).  CBC in am (ordered)  [name], MD  08/13/2020, 9:13 AM [facility] Pager [phone]

## 2020-08-13 NOTE — Progress Notes (Signed)
PROGRESS NOTE    Cody Frazier  XIP:[id number] DOB: 04/23/1946 DOA: 08/12/2020 PCP: Cassandria Anger, MD    Chief Complaint  Patient presents with  . Abnormal Lab    Brief Narrative:  Chief Complaint: Low hemoglobin HPI: Cody Frazier is a 74 y.o. male with medical history significant of paroxysmal A. fib-on Eliquis, obstructive sleep apnea, iron deficiency anemia, hypertension, hyperlipidemia,  presents to emergency department for evaluation of low hemoglobin.  Patient reports worsening shortness of breath especially with exertion, generalized weakness, fatigue and has no energy.  He was seen by cardiologist yesterday and labs were ordered which showed hemoglobin of 3.6 and was sent to ED for further evaluation and management.  He has history of gastritis/reactive gastropathy -recent EGD done on 07/26/2020-which shows short segment Barrett's disease.  Gastritis, small hiatal hernia, nonbleeding duodenal diverticulum.  He denies epigastric burning, over-the-counter use of NSAIDs, decreased appetite, weight loss, headache, blurry vision, chest pain, palpitations, fever, chills, nausea, vomiting, melena, hematemesis.  Reports worsening leg swelling since 2 to 3 weeks.  Compliant with diuretics.  Denies orthopnea or PND.  His last Eliquis dose was on 9/29.  ED Course: Upon arrival to ED: Patient's vital signs stable.  Maintaining oxygen saturation on room air, CBC shows hemoglobin of 38/13.6, platelet: 481, CMP shows potassium of 3.4, CKD stage II-at baseline.  EDP ordered 3 unit PRBC.  Consulted GI.  POC occult blood negative.  [facility] consulted for admission for symptomatic anemia.   Subjective:  Received 4 units of PRBC last night, report no overt GI bleed  Assessment & Plan:   Principal Problem:   Symptomatic anemia Active Problems:   Dyslipidemia   Paroxysmal atrial fibrillation (HCC)   Essential hypertension  Acute symptomatic anemia: Likely blood loss  anemia from GI source Total bilirubin within normal limit, is not consistent with hemolytic anemia Hemoglobin 3.5 on presentation, report no overt GI bleed, recent EGD in September Status post PRBC transfusion x4 Hold Eliquis GI consulted, will follow recommendation  Mild AKI on CKD 2 -BUN 14 creatinine 1.28 on presentation, BUN 11 creatinine 1.15 this morning -Creatinine improved after blood transfusion -Monitor BMP, renal dosing meds, hold Micardis  Chronic diastolic CHF: Recent echocardiogram left ventricular EF 60 to 65% with grade 1 diastolic dysfunction -Appears euvolemic.  Continue home meds statin, metoprolol, Lasix every other day.  Strict INO's and daily weight.  Monitor electrolytes He was recently started on Lasix every other day at home, is getting Lasix today, does have some bilateral lower extremity pitting edema  Paroxysmal A. fib: Sinus rhythm currently.  Continue metoprolol and Cardizem.  Hold Eliquis.  Monitor closely on telemetry.  Hypertension: Stable -Continue metoprolol, hold telmisartan  DVT prophylaxis: SCDs Start: 08/12/20 1456   Code Status:full Family Communication: Wife at bedside Disposition:   Status is: Inpatient  Dispo: The patient is from: Home              Anticipated d/c is to: Home              Anticipated d/c date is: TBD              Patient currently not medically stable  Consultants:   gi  Procedures:   PRBC transfusion x4  Antimicrobials:   None     Objective: Vitals:   08/13/20 0900 08/13/20 0953 08/13/20 1000 08/13/20 1100  BP: (!) 153/79 138/68 (!) 155/96 (!) 142/74  Pulse: 79 88 85 92  Resp: 18 18 16  16  Temp:  98.3 F (36.8 C)  98.9 F (37.2 C)  TempSrc:  Oral  Oral  SpO2: 92% 97% 96% 90%  Weight:      Height:        Intake/Output Summary (Last 24 hours) at 08/13/2020 1200 Last data filed at 08/13/2020 1100 Gross per 24 hour  Intake 2470.02 ml  Output 1225 ml  Net 1245.02 ml   Filed Weights    08/12/20 2336 08/13/20 0500  Weight: 111.2 kg 112.2 kg    Examination:  General exam: Appear weak, pale, NAD Respiratory system: Diminished at bases , no wheezing, no rales, no rhonchi . Respiratory effort normal. Cardiovascular system: S1 & S2 heard, RRR. No JVD, no murmur, +pedal edema. Gastrointestinal system: Abdomen is nondistended, soft and nontender. No organomegaly or masses felt. Normal bowel sounds heard. Central nervous system: Alert and oriented. No focal neurological deficits. Extremities: Generalized weakness, bilateral lower extremity pitting edema Skin: No rashes, lesions or ulcers Psychiatry: Judgement and insight appear normal. Mood & affect appropriate.     Data Reviewed: I have personally reviewed following labs and imaging studies  CBC: Recent Labs  Lab 08/10/20 1300 08/12/20 1011 08/12/20 1345 08/13/20 0146 08/13/20 1118  WBC 5.6 5.1 5.4 4.7  --   HGB 3.5* 3.6* 3.8* 6.5* 7.8*  HCT 12.0* 12.7* 13.6* 21.2* 24.5*  MCV 83 81 88.3 88.3  --   PLT 394 481* 430* 338  --     Basic Metabolic Panel: Recent Labs  Lab 08/08/20 1408 08/12/20 1345 08/12/20 1511 08/13/20 0057  NA 145* 145  --  143  K 3.9 3.4*  --  3.8  CL 108* 109  --  108  CO2 22 25  --  24  GLUCOSE 108* 129*  --  104*  BUN 14 11  --  11  CREATININE 1.28* 1.25*  --  1.15  CALCIUM 9.0 9.0  --  8.8*  MG  --   --  2.0  --     GFR: Estimated Creatinine Clearance: 66.3 mL/min (by C-G formula based on SCr of 1.15 mg/dL).  Liver Function Tests: Recent Labs  Lab 08/12/20 1345 08/13/20 0057  AST 15 28  ALT 10 7  ALKPHOS 84 69  BILITOT 0.6 0.9  PROT 5.8* 5.4*  ALBUMIN 3.0* 2.8*    CBG: No results for input(s): GLUCAP in the last 168 hours.   Recent Results (from the past 240 hour(s))  Respiratory Panel by RT PCR (Flu A&B, Covid) - Nasopharyngeal Swab     Status: None   Collection Time: 08/12/20  4:55 PM   Specimen: Nasopharyngeal Swab  Result Value Ref Range Status   SARS  Coronavirus 2 by RT PCR NEGATIVE NEGATIVE Final    Comment: (NOTE) SARS-CoV-2 target nucleic acids are NOT DETECTED.  The SARS-CoV-2 RNA is generally detectable in upper respiratoy specimens during the acute phase of infection. The lowest concentration of SARS-CoV-2 viral copies this assay can detect is 131 copies/mL. A negative result does not preclude SARS-Cov-2 infection and should not be used as the sole basis for treatment or other patient management decisions. A negative result may occur with  improper specimen collection/handling, submission of specimen other than nasopharyngeal swab, presence of viral mutation(s) within the areas targeted by this assay, and inadequate number of viral copies (<131 copies/mL). A negative result must be combined with clinical observations, patient history, and epidemiological information. The expected result is Negative.  Fact Sheet for Patients:  PinkCheek.be  Fact Sheet for Healthcare Providers:  GravelBags.it  This test is no t yet approved or cleared by the [place] FDA and  has been authorized for detection and/or diagnosis of SARS-CoV-2 by FDA under an Emergency Use Authorization (EUA). This EUA will remain  in effect (meaning this test can be used) for the duration of the COVID-19 declaration under Section 564(b)(1) of the Act, 21 U.S.C. section 360bbb-3(b)(1), unless the authorization is terminated or revoked sooner.     Influenza A by PCR NEGATIVE NEGATIVE Final   Influenza B by PCR NEGATIVE NEGATIVE Final    Comment: (NOTE) The Xpert Xpress SARS-CoV-2/FLU/RSV assay is intended as an aid in  the diagnosis of influenza from Nasopharyngeal swab specimens and  should not be used as a sole basis for treatment. Nasal washings and  aspirates are unacceptable for Xpert Xpress SARS-CoV-2/FLU/RSV  testing.  Fact Sheet for  Patients: PinkCheek.be  Fact Sheet for Healthcare Providers: GravelBags.it  This test is not yet approved or cleared by the [place] FDA and  has been authorized for detection and/or diagnosis of SARS-CoV-2 by  FDA under an Emergency Use Authorization (EUA). This EUA will remain  in effect (meaning this test can be used) for the duration of the  Covid-19 declaration under Section 564(b)(1) of the Act, 21  U.S.C. section 360bbb-3(b)(1), unless the authorization is  terminated or revoked. Performed at [facility], [place] [address] [place], [place] [address]   MRSA PCR Screening     Status: None   Collection Time: 08/12/20 11:54 PM   Specimen: Nasal Mucosa; Nasopharyngeal  Result Value Ref Range Status   MRSA by PCR NEGATIVE NEGATIVE Final    Comment:        The GeneXpert MRSA Assay (FDA approved for NASAL specimens only), is one component of a comprehensive MRSA colonization surveillance program. It is not intended to diagnose MRSA infection nor to guide or monitor treatment for MRSA infections. Performed at [facility], [place] [address], [place], [place] [address]          Radiology Studies: No results found.      Scheduled Meds: . atorvastatin  20 mg Oral Daily  . diltiazem  240 mg Oral Daily  . furosemide  40 mg Oral QODAY  . [START ON 08/14/2020] influenza vaccine adjuvanted  0.5 mL Intramuscular Tomorrow-1000  . metoprolol succinate  25 mg Oral Daily  . pantoprazole  40 mg Oral BID AC   Continuous Infusions:   LOS: 1 day   Time spent: 77mins Greater than 50% of this time was spent in counseling, explanation of diagnosis, planning of further management, and coordination of care.  I have personally reviewed and interpreted on  08/13/2020 daily labs, tele strips, imagings as discussed above under date review session and assessment and plans.  I reviewed all nursing notes, pharmacy  notes, consultant notes,  vitals, pertinent old records  I have discussed plan of care as described above with RN , patient and family on 08/13/2020  Voice Recognition /Dragon dictation system was used to create this note, attempts have been made to correct errors. Please contact the author with questions and/or clarifications.   Florencia Reasons, MD PhD FACP [facility]  Available via Epic secure chat 7am-7pm for nonurgent issues Please page for urgent issues To page the attending provider between 7A-7P or the covering provider during after hours 7P-7A, please log into the web site [email or web address] and access using universal [place] password for  that web site. If you do not have the password, please call the hospital operator.    08/13/2020, 12:00 PM

## 2020-08-13 NOTE — Plan of Care (Signed)

## 2020-08-13 NOTE — H&P (View-Only) (Signed)
[facility] Progress Note    Since last GI note: Slept OK last night.  Tolerating liquid diet. A bit of nausea but no vomiting and no BMs since admit. Feels overall better since getting blood tranfusions.  Objective: Vital signs in last 24 hours: Temp:  [98.1 F (36.7 C)-99.4 F (37.4 C)] 98.3 F (36.8 C) (10/02 0742) Pulse Rate:  [76-93] 80 (10/02 0800) Resp:  [13-23] 19 (10/02 0800) BP: (116-152)/(57-83) 151/75 (10/02 0800) SpO2:  [87 %-100 %] 92 % (10/02 0800) Weight:  [111.2 kg-112.2 kg] 112.2 kg (10/02 0500) Last BM Date: 08/12/20 General: alert and oriented times 3 Heart: regular rate and rythm Abdomen: soft, non-tender, non-distended, normal bowel sounds  Lab Results: Recent Labs    08/12/20 1011 08/12/20 1345 08/13/20 0146  WBC 5.1 5.4 4.7  HGB 3.6* 3.8* 6.5*  PLT 481* 430* 338  MCV 81 88.3 88.3   Recent Labs    08/12/20 1345 08/13/20 0057  NA 145 143  K 3.4* 3.8  CL 109 108  CO2 25 24  GLUCOSE 129* 104*  BUN 11 11  CREATININE 1.25* 1.15  CALCIUM 9.0 8.8*   Recent Labs    08/12/20 1345 08/13/20 0057  PROT 5.8* 5.4*  ALBUMIN 3.0* 2.8*  AST 15 28  ALT 10 7  ALKPHOS 84 69  BILITOT 0.6 0.9   Recent Labs    08/12/20 2237  INR 1.1     Medications: Scheduled Meds: . atorvastatin  20 mg Oral Daily  . diltiazem  240 mg Oral Daily  . furosemide  20 mg Intravenous Once  . furosemide  40 mg Oral QODAY  . [START ON 08/14/2020] influenza vaccine adjuvanted  0.5 mL Intramuscular Tomorrow-1000  . metoprolol succinate  25 mg Oral Daily  . pantoprazole (PROTONIX) IV  40 mg Intravenous Q12H   Continuous Infusions: PRN Meds:.acetaminophen **OR** acetaminophen, ondansetron **OR** ondansetron (ZOFRAN) IV, oxyCODONE   Assessment/Plan: 74 y.o. male with severe IDA without overt bleeding of any kind  EGD 07/2020, Colonoscopy 03/2017.    He is getting his 4th unit of PRBC now and I am going to order CXR and CT scan abd/pelvis to begin the  workup of his severe IDA. He understands he may need further testing after that.  I am changing him to oral PPI (he is not overtly GI bleeding).  CBC in am (ordered)  [name], MD  08/13/2020, 9:13 AM [facility] Pager [phone]

## 2020-08-14 ENCOUNTER — Encounter (HOSPITAL_COMMUNITY): Payer: Self-pay | Admitting: Internal Medicine

## 2020-08-14 ENCOUNTER — Inpatient Hospital Stay (HOSPITAL_COMMUNITY): Payer: Medicare Other | Admitting: Certified Registered Nurse Anesthetist

## 2020-08-14 ENCOUNTER — Encounter (HOSPITAL_COMMUNITY)
Admission: EM | Disposition: A | Discharge status: Home / Self Care | Payer: Self-pay | Source: Home / Self Care | Attending: Internal Medicine

## 2020-08-14 HISTORY — PX: COLONOSCOPY WITH PROPOFOL: SHX5780

## 2020-08-14 LAB — TYPE AND SCREEN
ABO/RH(D): O POS
Antibody Screen: NEGATIVE
Unit division: 0
Unit division: 0
Unit division: 0
Unit division: 0

## 2020-08-14 LAB — BPAM RBC
Blood Product Expiration Date: 202110312359
Blood Product Expiration Date: 202110312359
Blood Product Expiration Date: 202110312359
Blood Product Expiration Date: 202111012359
ISSUE DATE / TIME: 202110011506
ISSUE DATE / TIME: 202110011742
ISSUE DATE / TIME: 202110012029
ISSUE DATE / TIME: 202110020725
Unit Type and Rh: 5100
Unit Type and Rh: 5100
Unit Type and Rh: 5100
Unit Type and Rh: 5100

## 2020-08-14 LAB — CBC
HCT: 30.6 % — ABNORMAL LOW (ref 39.0–52.0)
Hemoglobin: 9.6 g/dL — ABNORMAL LOW (ref 13.0–17.0)
MCH: 27.9 pg (ref 26.0–34.0)
MCHC: 31.4 g/dL (ref 30.0–36.0)
MCV: 89 fL (ref 80.0–100.0)
Platelets: 411 10*3/uL — ABNORMAL HIGH (ref 150–400)
RBC: 3.44 MIL/uL — ABNORMAL LOW (ref 4.22–5.81)
RDW: 15.8 % — ABNORMAL HIGH (ref 11.5–15.5)
WBC: 8.5 10*3/uL (ref 4.0–10.5)
nRBC: 2.5 % — ABNORMAL HIGH (ref 0.0–0.2)

## 2020-08-14 LAB — BASIC METABOLIC PANEL
Anion gap: 14 (ref 5–15)
BUN: 6 mg/dL — ABNORMAL LOW (ref 8–23)
CO2: 24 mmol/L (ref 22–32)
Calcium: 9.2 mg/dL (ref 8.9–10.3)
Chloride: 105 mmol/L (ref 98–111)
Creatinine, Ser: 1.05 mg/dL (ref 0.61–1.24)
GFR calc Af Amer: >60 mL/min (ref >60)
GFR calc non Af Amer: >60 mL/min (ref >60)
Glucose, Bld: 100 mg/dL — ABNORMAL HIGH (ref 70–99)
Potassium: 3.9 mmol/L (ref 3.5–5.1)
Sodium: 143 mmol/L (ref 135–145)

## 2020-08-14 LAB — MAGNESIUM: Magnesium: 2.2 mg/dL (ref 1.7–2.4)

## 2020-08-14 SURGERY — COLONOSCOPY WITH PROPOFOL
Anesthesia: Monitor Anesthesia Care

## 2020-08-14 MED ORDER — LACTATED RINGERS IV SOLN
INTRAVENOUS | Status: AC | PRN
  Administered 2020-08-14: 1000 mL via INTRAVENOUS

## 2020-08-14 MED ORDER — PROPOFOL 500 MG/50ML IV EMUL
INTRAVENOUS | Status: DC | PRN
  Administered 2020-08-14: 100 ug/kg/min via INTRAVENOUS

## 2020-08-14 MED ORDER — SODIUM CHLORIDE 0.9 % IV SOLN
510.0000 mg | Freq: Once | INTRAVENOUS | Status: AC
  Administered 2020-08-14: 510 mg via INTRAVENOUS
  Filled 2020-08-14: qty 17

## 2020-08-14 MED ORDER — PROPOFOL 10 MG/ML IV BOLUS
INTRAVENOUS | Status: DC | PRN
  Administered 2020-08-14: 20 mg via INTRAVENOUS

## 2020-08-14 SURGICAL SUPPLY — 22 items

## 2020-08-14 NOTE — Progress Notes (Signed)
Pt was in Endo from 9 to 10.30 am for colonoscopy. Pain medicines given per receiving. Waiting for the [facility] from Pharmacy. Wife is in bed side and is aware and expecting possible discharge later this evening today.  Palma Holter, RN

## 2020-08-14 NOTE — Anesthesia Postprocedure Evaluation (Signed)
Anesthesia Post Note  Patient: Cody Frazier  Procedure(s) Performed: COLONOSCOPY WITH PROPOFOL (N/A )     Patient location during evaluation: PACU Anesthesia Type: MAC Level of consciousness: awake Pain management: pain level controlled Vital Signs Assessment: post-procedure vital signs reviewed and stable Respiratory status: spontaneous breathing, nonlabored ventilation, respiratory function stable and patient connected to nasal cannula oxygen Cardiovascular status: stable and blood pressure returned to baseline Postop Assessment: no apparent nausea or vomiting Anesthetic complications: no   No complications documented.  Last Vitals:  Vitals:   08/14/20 1032 08/14/20 1100  BP: 130/80 (!) 148/63  Pulse: 82 81  Resp:  20  Temp:  36.7 C  SpO2:  92%    Last Pain:  Vitals:   08/14/20 1132  TempSrc:   PainSc: 0-No pain                 Kayelynn Abdou P Abrham Maslowski

## 2020-08-14 NOTE — Interval H&P Note (Signed)
History and Physical Interval Note:  08/14/2020 9:04 AM  Cody Frazier  has presented today for surgery, with the diagnosis of severe IDA.  The various methods of treatment have been discussed with the patient and family. After consideration of risks, benefits and other options for treatment, the patient has consented to  Procedure(s): COLONOSCOPY WITH PROPOFOL (N/A) as a surgical intervention.  The patient's history has been reviewed, patient examined, no change in status, stable for surgery.  I have reviewed the patient's chart and labs.  Questions were answered to the patient's satisfaction.     [name]

## 2020-08-14 NOTE — Discharge Summary (Signed)
Discharge Summary  Cody Frazier QPY:[id number] DOB: January 09, 1946  PCP: Cassandria Anger, MD  Admit date: 08/12/2020 Discharge date: 08/14/2020  Time spent: 42mins, more than 50% time spent on coordination of care.   Recommendations for Outpatient Follow-up:  1. F/u with PCP within a week  for hospital discharge follow up, repeat cbc/bmp at follow up 2. F/u with gastroenterology  Discharge Diagnoses:  Active Hospital Problems   Diagnosis Date Noted  . Symptomatic anemia 08/12/2020  . Essential hypertension 08/10/2020  . Paroxysmal atrial fibrillation ([place]) 05/15/2017  . Dyslipidemia 06/09/2007    Resolved Hospital Problems  No resolved problems to display.    Discharge Condition: stable  Diet recommendation: heart healthy  Filed Weights   08/12/20 2336 08/13/20 0500 08/14/20 0451  Weight: 111.2 kg 112.2 kg 107.5 kg    History of present illness: (Per admitting MD Dr. Doristine Bosworth) Chief Complaint: Low hemoglobin HPI: Cody Frazier is a 74 y.o. male with medical history significant of paroxysmal A. fib-on Eliquis, obstructive sleep apnea, iron deficiency anemia, hypertension, hyperlipidemia,  presents to emergency department for evaluation of low hemoglobin.  Patient reports worsening shortness of breath especially with exertion, generalized weakness, fatigue and has no energy.  He was seen by cardiologist yesterday and labs were ordered which showed hemoglobin of 3.6 and was sent to ED for further evaluation and management.  He has history of gastritis/reactive gastropathy -recent EGD done on 07/26/2020-which shows short segment Barrett's disease.  Gastritis, small hiatal hernia, nonbleeding duodenal diverticulum.  He denies epigastric burning, over-the-counter use of NSAIDs, decreased appetite, weight loss, headache, blurry vision, chest pain, palpitations, fever, chills, nausea, vomiting, melena, hematemesis.  Reports worsening leg swelling since 2 to 3 weeks.   Compliant with diuretics.  Denies orthopnea or PND.  His last Eliquis dose was on 9/29.  ED Course: Upon arrival to ED: Patient's vital signs stable.  Maintaining oxygen saturation on room air, CBC shows hemoglobin of 38/13.6, platelet: 481, CMP shows potassium of 3.4, CKD stage II-at baseline.  EDP ordered 3 unit PRBC.  Consulted GI.  POC occult blood negative.  [facility] hospitalist consulted for admission for symptomatic anemia.  Hospital Course:  Principal Problem:   Symptomatic anemia Active Problems:   Dyslipidemia   Paroxysmal atrial fibrillation (H[facility])   Essential hypertension   Acute symptomatic anemia: Likely blood loss anemia from GI source Total bilirubin within normal limit, is not consistent with hemolytic anemia Hemoglobin 3.5 on presentation, report no overt GI bleed, recent EGD in September Status post PRBC transfusion x4, IV  Ironx1, hemoglobin 9.6 at discharge Hold Eliquis Colonoscopy no source of bleeding identified, he is cleared to discharge home per GI recommendation, GI plan to do capsule study at follow-up appointment -Appreciate GI input   Mild AKI on CKD 2 -BUN 14 creatinine 1.28 on presentation, BUN 6 creatinine 1.05 at discharge. -Creatinine improved after blood transfusion - renal dosing meds, Micardis held in the hospital, resumed at discharge  Chronic diastolic CHF: -Recent echocardiogram left ventricular EF 60 to 65% with grade 1 diastolic dysfunction -Continue home meds statin, metoprolol, Lasixevery other day.  -He  does have some bilateral lower extremity pitting edema ,due to n.p.o. status in the hospital and getting bowel prep, he did not receive aggressive diuresis in hospital -He is continued on home dose Lasix at discharge -He is to follow-up with cardiology  Paroxysmal A. fib: Sinus rhythm currently. Continue metoprolol and Cardizem. Eliquis held in the hospital, resumed after colonoscopy per GI recommendation  Hypertension:  Stable -Continue metoprolol -telmisartan held in the hospital, resumed at discharge  DVT prophylaxis: SCDs Start: 08/12/20 1456  Code Status:full Family Communication: Wife at bedside Disposition:   Status is: Inpatient  Dispo: The patient is from: Home  Anticipated d/c is to: Home   Consultants:   LBgi  Procedures:   PRBC transfusion x4  colonoscopy  Antimicrobials:   None     Discharge Exam: BP (!) 148/63 (BP Location: Left Arm)   Pulse 81   Temp 98 F (36.7 C) (Oral)   Resp 20   Ht 5\' 6"  (1.676 m)   Wt 107.5 kg   SpO2 92%   BMI 38.25 kg/m   General: NAD Cardiovascular: RRR Respiratory: diminished at basis, no wheezing, no rales, no rhonchi  Discharge Instructions You were cared for by a hospitalist during your hospital stay. If you have any questions about your discharge medications or the care you received while you were in the hospital after you are discharged, you can call the unit and asked to speak with the hospitalist on call if the hospitalist that took care of you is not available. Once you are discharged, your primary care physician will handle any further medical issues. Please note that NO REFILLS for any discharge medications will be authorized once you are discharged, as it is imperative that you return to your primary care physician (or establish a relationship with a primary care physician if you do not have one) for your aftercare needs so that they can reassess your need for medications and monitor your lab values.  Discharge Instructions    Diet - low sodium heart healthy   Complete by: As directed    Increase activity slowly   Complete by: As directed      Allergies as of 08/14/2020      Reactions   Codeine Phosphate Swelling   Nsaids Other (See Comments)   ulcers   Adhesive [tape] Rash   ekg strips      Medication List    TAKE these medications   acetaminophen 500 MG tablet Commonly known as:  TYLENOL Take 1,000 mg by mouth every 6 (six) hours as needed for mild pain or moderate pain.   atorvastatin 20 MG tablet Commonly known as: LIPITOR TAKE 1 TABLET BY MOUTH EVERY DAY   cetirizine 10 MG tablet Commonly known as: ZYRTEC Take 10 mg by mouth daily.   diltiazem 240 MG 24 hr capsule Commonly known as: CARDIZEM CD TAKE 1 CAPSULE BY MOUTH EVERY DAY What changed: how much to take   Eliquis 2.5 MG Tabs tablet Generic drug: apixaban TAKE 1 TABLET BY MOUTH TWICE A DAY What changed: how much to take   famotidine 40 MG tablet Commonly known as: PEPCID Take 1 tablet (40 mg total) by mouth at bedtime.   fluticasone 50 MCG/ACT nasal spray Commonly known as: FLONASE SPRAY 2 SPRAYS INTO EACH NOSTRIL EVERY DAY What changed: See the new instructions.   furosemide 40 MG tablet Commonly known as: LASIX Take 1 tablet (40 mg total) by mouth every other day.   latanoprost 0.005 % ophthalmic solution Commonly known as: XALATAN Place 1 drop into both eyes at bedtime.   metoprolol succinate 25 MG 24 hr tablet Commonly known as: TOPROL-XL TAKE 1 TABLET BY MOUTH EVERY DAY   oxyCODONE 15 MG immediate release tablet Commonly known as: ROXICODONE Take 1 tablet (15 mg total) by mouth 4 (four) times daily. What changed:   when to take this  reasons to take this   pantoprazole 40 MG tablet Commonly known as: PROTONIX Take 1 tablet (40 mg total) by mouth 2 (two) times daily.   polyethylene glycol 17 g packet Commonly known as: MIRALAX / GLYCOLAX Take 17 g by mouth daily.   PRESCRIPTION MEDICATION Inhale into the lungs at bedtime. CPAP   telmisartan 20 MG tablet Commonly known as: MICARDIS Take 1 tablet (20 mg total) by mouth daily. What changed: when to take this   timolol 0.25 % ophthalmic solution Commonly known as: TIMOPTIC Place 1 drop into both eyes 2 (two) times daily.   triamcinolone cream 0.5 % Commonly known as: KENALOG Apply 1 application topically 3  (three) times daily.   Vitamin D3 1.25 MG (50000 UT) Caps TAKE 1 CAPSULE BY MOUTH EVERY 30 (THIRTY) DAYS. What changed: additional instructions      Allergies  Allergen Reactions  . Codeine Phosphate Swelling  . Nsaids Other (See Comments)    ulcers  . Adhesive [Tape] Rash    ekg strips    Follow-up Information    Plotnikov, Evie Lacks, MD Follow up.   Specialty: Internal Medicine Contact information: [address] [place] [address] [phone]        Sanda Klein, MD .   Specialty: Cardiology Contact information: [address] [place] [address] [phone]        Ladene Artist, MD Follow up.   Specialty: Gastroenterology Contact information: [address] [place] [address] [phone]                The results of significant diagnostics from this hospitalization (including imaging, microbiology, ancillary and laboratory) are listed below for reference.    Significant Diagnostic Studies: DG Chest 2 View  Result Date: 08/13/2020 CLINICAL DATA:  Iron deficiency, anemia. EXAM: CHEST - 2 VIEW COMPARISON:  05/19/2018 FINDINGS: Low lung volumes. Mild diffuse interstitial prominence. No confluent airspace disease. Heart size upper limits normal for technique. Aortic Atherosclerosis (ICD10-170.0). Blunting of posterior costophrenic angles suggesting small effusions. No pneumothorax. Spondylitic changes near the thoracolumbar junction. IMPRESSION: Low volumes with small pleural effusions. Electronically Signed   By: Lucrezia Europe M.D.   On: 08/13/2020 14:14   CT ABDOMEN PELVIS W CONTRAST  Result Date: 08/13/2020 CLINICAL DATA:  Anemia.  History of gastric ulcers. EXAM: CT ABDOMEN AND PELVIS WITH CONTRAST TECHNIQUE: Multidetector CT imaging of the abdomen and pelvis was performed using the standard protocol following bolus administration of intravenous contrast. CONTRAST:  110mL OMNIPAQUE IOHEXOL 300 MG/ML  SOLN COMPARISON:   05/10/2017 FINDINGS: Lower chest: Coronary calcifications. New small pleural effusions, right greater than left. Dependent atelectasis in the posterior right lower lobe. Hepatobiliary: No focal liver abnormality is seen. Gallbladder unremarkable. CBD is mildly distended up to 12 mm diameter, seen down to the ampulla without radiodense calculus. Portal vein patent. Pancreas: Diffuse pancreatic atrophy without ductal dilatation. Spleen: Normal in size without focal abnormality. Adrenals/Urinary Tract: Adrenal glands unremarkable. Bilateral renal cysts, stable since previous. No hydronephrosis. Urinary bladder physiologically distended. Stomach/Bowel: Postop changes of [place] 1. Stomach and small bowel are nondilated. Appendix surgically absent. The colon is nondilated, with scattered descending and sigmoid diverticula; no adjacent inflammatory change. Vascular/Lymphatic: Aortoiliac calcified atheromatous plaque without aneurysm or stenosis. Circumaortic left renal vein, an anatomic variant. Portal vein patent. No abdominal or pelvic adenopathy. Reproductive: Prostate is unremarkable. Other: Bilateral pelvic phleboliths.  No ascites.  No free air. Musculoskeletal: Thoracolumbar dextroscoliosis apex L2 multilevel thoracolumbar spondylitic change. Previous  instrumented spinal fusion L1-S1. No fracture or worrisome bone lesion. IMPRESSION: 1. No acute findings. 2. New small bilateral pleural effusions, right greater than left. 3. Coronary and  Aortic Atherosclerosis (ICD10-I70.0). 4. Descending and sigmoid diverticulosis. Electronically Signed   By: Lucrezia Europe M.D.   On: 08/13/2020 14:12   ECHOCARDIOGRAM COMPLETE  Result Date: 08/03/2020    ECHOCARDIOGRAM REPORT   Patient Name:   Cody Frazier Date of Exam: 08/03/2020 Medical Rec #:  [id number]       Height:       70.0 in Accession #:    [id number]      Weight:       244.4 lb Date of Birth:  08/03/1946      BSA:          2.273 m Patient Age:    75 years        BP:            110/64 mmHg Patient Gender: M               HR:           95 bpm. Exam Location:  Outpatient Procedure: 2D Echo, Cardiac Doppler and Color Doppler Indications:    R06.00 SOB  History:        Patient has prior history of Echocardiogram examinations, most                 recent 01/01/2019. Arrythmias:Atrial Fibrillation,                 Signs/Symptoms:Dyspnea; Risk Factors:Hypertension and Sleep                 Apnea.  Sonographer:    Marygrace Drought RCS Referring Phys: [id number] TIFFANY Tool IMPRESSIONS  1. Left ventricular ejection fraction, by estimation, is 60 to 65%. The left ventricle has normal function. The left ventricle has no regional wall motion abnormalities. Left ventricular diastolic parameters are consistent with Grade I diastolic dysfunction (impaired relaxation).  2. Right ventricular systolic function is normal. The right ventricular size is normal. There is mildly elevated pulmonary artery systolic pressure. The estimated right ventricular systolic pressure is 78.2 mmHg.  3. The mitral valve is normal in structure. Trivial mitral valve regurgitation. No evidence of mitral stenosis.  4. The aortic valve is tricuspid. Aortic valve regurgitation is not visualized. Mild aortic valve stenosis. Aortic valve area, by VTI measures 1.54 cm. Aortic valve mean gradient measures 12.0 mmHg.  5. The inferior vena cava is normal in size with greater than 50% respiratory variability, suggesting right atrial pressure of 3 mmHg. FINDINGS  Left Ventricle: Left ventricular ejection fraction, by estimation, is 60 to 65%. The left ventricle has normal function. The left ventricle has no regional wall motion abnormalities. The left ventricular internal cavity size was normal in size. There is  no left ventricular hypertrophy. Left ventricular diastolic parameters are consistent with Grade I diastolic dysfunction (impaired relaxation). Right Ventricle: The right ventricular size is normal. No increase in right  ventricular wall thickness. Right ventricular systolic function is normal. There is mildly elevated pulmonary artery systolic pressure. The tricuspid regurgitant velocity is 3.14  m/s, and with an assumed right atrial pressure of 3 mmHg, the estimated right ventricular systolic pressure is 42.3 mmHg. Left Atrium: Left atrial size was normal in size. Right Atrium: Right atrial size was normal in size. Pericardium: There is no evidence of pericardial effusion. Mitral Valve: The mitral valve is normal in structure. Trivial  mitral valve regurgitation. No evidence of mitral valve stenosis. Tricuspid Valve: The tricuspid valve is normal in structure. Tricuspid valve regurgitation is trivial. Aortic Valve: The aortic valve is tricuspid. Aortic valve regurgitation is not visualized. Mild aortic stenosis is present. Aortic valve mean gradient measures 12.0 mmHg. Aortic valve peak gradient measures 25.8 mmHg. Aortic valve area, by VTI measures 1.54 cm. Pulmonic Valve: The pulmonic valve was normal in structure. Pulmonic valve regurgitation is trivial. Aorta: The aortic root is normal in size and structure. Venous: The inferior vena cava is normal in size with greater than 50% respiratory variability, suggesting right atrial pressure of 3 mmHg. IAS/Shunts: No atrial level shunt detected by color flow Doppler.  LEFT VENTRICLE PLAX 2D LVIDd:         3.90 cm  Diastology LVIDs:         2.70 cm  LV e' medial:    7.40 cm/s LV PW:         1.10 cm  LV E/e' medial:  13.1 LV IVS:        1.00 cm  LV e' lateral:   8.38 cm/s LVOT diam:     2.00 cm  LV E/e' lateral: 11.6 LV SV:         69 LV SV Index:   31 LVOT Area:     3.14 cm  RIGHT VENTRICLE RV Basal diam:  3.50 cm RV S prime:     14.60 cm/s TAPSE (M-mode): 1.6 cm RVSP:           42.4 mmHg LEFT ATRIUM             Index       RIGHT ATRIUM           Index LA diam:        3.80 cm 1.67 cm/m  RA Pressure: 3.00 mmHg LA Vol (A2C):   64.8 ml 28.51 ml/m RA Area:     16.30 cm LA Vol (A4C):    62.3 ml 27.41 ml/m RA Volume:   45.50 ml  20.02 ml/m LA Biplane Vol: 63.8 ml 28.07 ml/m  AORTIC VALVE AV Area (Vmax):    1.30 cm AV Area (Vmean):   1.54 cm AV Area (VTI):     1.54 cm AV Vmax:           254.00 cm/s AV Vmean:          163.000 cm/s AV VTI:            0.452 m AV Peak Grad:      25.8 mmHg AV Mean Grad:      12.0 mmHg LVOT Vmax:         105.00 cm/s LVOT Vmean:        79.800 cm/s LVOT VTI:          0.221 m LVOT/AV VTI ratio: 0.49  AORTA Ao Root diam: 3.50 cm Ao Asc diam:  3.70 cm MITRAL VALVE                TRICUSPID VALVE MV Area (PHT): 4.57 cm     TR Peak grad:   39.4 mmHg MV Decel Time: 166 msec     TR Vmax:        314.00 cm/s MV E velocity: 96.80 cm/s   Estimated RAP:  3.00 mmHg MV A velocity: 100.00 cm/s  RVSP:           42.4 mmHg MV E/A ratio:  0.97  SHUNTS                             Systemic VTI:  0.22 m                             Systemic Diam: 2.00 cm Loralie Champagne MD Electronically signed by Loralie Champagne MD Signature Date/Time: 08/03/2020/3:21:06 PM    Final     Microbiology: Recent Results (from the past 240 hour(s))  Respiratory Panel by RT PCR (Flu A&B, Covid) - Nasopharyngeal Swab     Status: None   Collection Time: 08/12/20  4:55 PM   Specimen: Nasopharyngeal Swab  Result Value Ref Range Status   SARS Coronavirus 2 by RT PCR NEGATIVE NEGATIVE Final    Comment: (NOTE) SARS-CoV-2 target nucleic acids are NOT DETECTED.  The SARS-CoV-2 RNA is generally detectable in upper respiratoy specimens during the acute phase of infection. The lowest concentration of SARS-CoV-2 viral copies this assay can detect is 131 copies/mL. A negative result does not preclude SARS-Cov-2 infection and should not be used as the sole basis for treatment or other patient management decisions. A negative result may occur with  improper specimen collection/handling, submission of specimen other than nasopharyngeal swab, presence of viral mutation(s) within the areas  targeted by this assay, and inadequate number of viral copies (<131 copies/mL). A negative result must be combined with clinical observations, patient history, and epidemiological information. The expected result is Negative.  Fact Sheet for Patients:  PinkCheek.be  Fact Sheet for Healthcare Providers:  GravelBags.it  This test is no t yet approved or cleared by the [place] FDA and  has been authorized for detection and/or diagnosis of SARS-CoV-2 by FDA under an Emergency Use Authorization (EUA). This EUA will remain  in effect (meaning this test can be used) for the duration of the COVID-19 declaration under Section 564(b)(1) of the Act, 21 U.S.C. section 360bbb-3(b)(1), unless the authorization is terminated or revoked sooner.     Influenza A by PCR NEGATIVE NEGATIVE Final   Influenza B by PCR NEGATIVE NEGATIVE Final    Comment: (NOTE) The Xpert Xpress SARS-CoV-2/FLU/RSV assay is intended as an aid in  the diagnosis of influenza from Nasopharyngeal swab specimens and  should not be used as a sole basis for treatment. Nasal washings and  aspirates are unacceptable for Xpert Xpress SARS-CoV-2/FLU/RSV  testing.  Fact Sheet for Patients: PinkCheek.be  Fact Sheet for Healthcare Providers: GravelBags.it  This test is not yet approved or cleared by the [place] FDA and  has been authorized for detection and/or diagnosis of SARS-CoV-2 by  FDA under an Emergency Use Authorization (EUA). This EUA will remain  in effect (meaning this test can be used) for the duration of the  Covid-19 declaration under Section 564(b)(1) of the Act, 21  U.S.C. section 360bbb-3(b)(1), unless the authorization is  terminated or revoked. Performed at [facility], [place] [address] [place], [place] [address]   MRSA PCR Screening     Status: None   Collection Time: 08/12/20  11:54 PM   Specimen: Nasal Mucosa; Nasopharyngeal  Result Value Ref Range Status   MRSA by PCR NEGATIVE NEGATIVE Final    Comment:        The GeneXpert MRSA Assay (FDA approved for NASAL specimens only), is one component of a comprehensive MRSA colonization surveillance program. It is not intended to diagnose MRSA  infection nor to guide or monitor treatment for MRSA infections. Performed at [facility], [place] [address], [place], [place] [address]      Labs: Basic Metabolic Panel: Recent Labs  Lab 08/08/20 1408 08/12/20 1345 08/12/20 1511 08/13/20 0057 08/14/20 0046  NA 145* 145  --  143 143  K 3.9 3.4*  --  3.8 3.9  CL 108* 109  --  108 105  CO2 22 25  --  24 24  GLUCOSE 108* 129*  --  104* 100*  BUN 14 11  --  11 6*  CREATININE 1.28* 1.25*  --  1.15 1.05  CALCIUM 9.0 9.0  --  8.8* 9.2  MG  --   --  2.0  --  2.2   Liver Function Tests: Recent Labs  Lab 08/12/20 1345 08/13/20 0057  AST 15 28  ALT 10 7  ALKPHOS 84 69  BILITOT 0.6 0.9  PROT 5.8* 5.4*  ALBUMIN 3.0* 2.8*   No results for input(s): LIPASE, AMYLASE in the last 168 hours. No results for input(s): AMMONIA in the last 168 hours. CBC: Recent Labs  Lab 08/10/20 1300 08/10/20 1300 08/12/20 1011 08/12/20 1345 08/13/20 0146 08/13/20 1118 08/14/20 0617  WBC 5.6  --  5.1 5.4 4.7  --  8.5  HGB 3.5*   < > 3.6* 3.8* 6.5* 7.8* 9.6*  HCT 12.0*   < > 12.7* 13.6* 21.2* 24.5* 30.6*  MCV 83  --  81 88.3 88.3  --  89.0  PLT 394  --  481* 430* 338  --  411*   < > = values in this interval not displayed.   Cardiac Enzymes: No results for input(s): CKTOTAL, CKMB, CKMBINDEX, TROPONINI in the last 168 hours. BNP: BNP (last 3 results) Recent Labs    07/29/20 1626  BNP 84.0    ProBNP (last 3 results) No results for input(s): PROBNP in the last 8760 hours.  CBG: No results for input(s): GLUCAP in the last 168 hours.     Signed:  Florencia Reasons MD, PhD, FACP  [facility] 08/14/2020, 3:48  PM

## 2020-08-14 NOTE — Plan of Care (Signed)
  Problem: Education: Goal: Knowledge of General Education information will improve Description: Including pain rating scale, medication(s)/side effects and non-pharmacologic comfort measures Outcome: Completed/Met   Problem: Clinical Measurements: Goal: Ability to maintain clinical measurements within normal limits will improve Outcome: Completed/Met Goal: Will remain free from infection Outcome: Completed/Met Goal: Diagnostic test results will improve Outcome: Completed/Met Goal: Respiratory complications will improve Outcome: Completed/Met Goal: Cardiovascular complication will be avoided Outcome: Completed/Met   Problem: Activity: Goal: Risk for activity intolerance will decrease Outcome: Completed/Met   Problem: Nutrition: Goal: Adequate nutrition will be maintained Outcome: Completed/Met   Problem: Coping: Goal: Level of anxiety will decrease Outcome: Completed/Met   Problem: Elimination: Goal: Will not experience complications related to bowel motility Outcome: Completed/Met Goal: Will not experience complications related to urinary retention Outcome: Completed/Met   Problem: Pain Managment: Goal: General experience of comfort will improve Outcome: Completed/Met   Problem: Safety: Goal: Ability to remain free from injury will improve Outcome: Completed/Met   Problem: Skin Integrity: Goal: Risk for impaired skin integrity will decrease Outcome: Completed/Met

## 2020-08-14 NOTE — Op Note (Addendum)
[facility] Patient Name: Cody Frazier Procedure Date : 08/14/2020 MRN: [id number] Attending MD: [name] , MD Date of Birth: [birth year] CSN: [id number] Age: 74 Admit Type: Inpatient Procedure:                Colonoscopy Indications:              Heme positive stool, Iron deficiency anemia; no                            overt bleeding, underwent EGD 2 weeks ago Dr. Fuller Plan                            for Barrett's surveillance, CT scans abd/pelvis and                            CXR this admit essentially normal Providers:                [name], MD, Glori Bickers, RN, Erenest Rasher, RN, Laverda Sorenson, Technician, Clearnce Sorrel, CRNA Referring MD:              Medicines:                Monitored Anesthesia Care Complications:            No immediate complications. Estimated blood loss:                            None. Estimated Blood Loss:     Estimated blood loss: none. Procedure:                Pre-Anesthesia Assessment:                           - Prior to the procedure, a History and Physical                            was performed, and patient medications and                            allergies were reviewed. The patient's tolerance of                            previous anesthesia was also reviewed. The risks                            and benefits of the procedure and the sedation                            options and risks were discussed with the patient.  All questions were answered, and informed consent                            was obtained. Prior Anticoagulants: last eliquis                            dose 4 days ago ASA Grade Assessment: III - A                            patient with severe systemic disease. After                            reviewing the risks and benefits, the patient was                            deemed in satisfactory condition to undergo the                             procedure.                           After obtaining informed consent, the colonoscope                            was passed under direct vision. Throughout the                            procedure, the patient's blood pressure, pulse, and                            oxygen saturations were monitored continuously. The                            CF-HQ190L ([id number]) Olympus colonoscope was                            introduced through the anus and advanced to the the                            cecum, identified by appendiceal orifice and                            ileocecal valve. The colonoscopy was performed                            without difficulty. The patient tolerated the                            procedure well. The quality of the bowel                            preparation was good. Scope In: 9:44:30 AM Scope Out: 9:54:13 AM Scope Withdrawal Time: 0 hours 5 minutes 25 seconds  Total Procedure Duration: 0 hours 9 minutes 43 seconds  Findings:      Multiple small and large-mouthed diverticula were found in the left       colon.      External and internal hemorrhoids were found. The hemorrhoids were small.      The exam was otherwise without abnormality on direct and retroflexion       views. Impression:               - Diverticulosis in the left colon.                           - External and internal hemorrhoids.                           - The examination was otherwise normal on direct                            and retroflexion views.                           - No polyps or cancers. Recommendation:           - He's had NO overt bleeding and has had a very                            nice bump in Hb from 3.7 to 9s after 4 unit blood                            tranfusion. OK to d/c later today after an iron                            infusion (which I have just ordered)                           - [facility] office will contact to arrange                             outpatient capsule endoscopy for IDA, heme + stools.                           - OK to resume his eliquis today. Procedure Code(s):        --- Professional ---                           [phone], Colonoscopy, flexible; diagnostic, including                            collection of specimen(s) by brushing or washing,                            when performed (separate procedure) Diagnosis Code(s):        --- Professional ---                           Q67.6, Other hemorrhoids  R19.5, Other fecal abnormalities                           D50.9, Iron deficiency anemia, unspecified                           K57.30, Diverticulosis of large intestine without                            perforation or abscess without bleeding CPT copyright 2019 [employer] Medical Association. All rights reserved. The codes documented in this report are preliminary and upon coder review may  be revised to meet current compliance requirements. [name], MD 08/14/2020 10:04:16 AM This report has been signed electronically. Number of Addenda: 0

## 2020-08-14 NOTE — Transfer of Care (Signed)
Immediate Anesthesia Transfer of Care Note  Patient: Cody Frazier  Procedure(s) Performed: COLONOSCOPY WITH PROPOFOL (N/A )  Patient Location: PACU  Anesthesia Type:MAC  Level of Consciousness: awake, alert  and oriented  Airway & Oxygen Therapy: Patient Spontanous Breathing  Post-op Assessment: Report given to RN and Post -op Vital signs reviewed and stable  Post vital signs: Reviewed and stable  Last Vitals:  Vitals Value Taken Time  BP    Temp    Pulse 86 08/14/20 1000  Resp 17 08/14/20 1000  SpO2 96 % 08/14/20 1000  Vitals shown include unvalidated device data.  Last Pain:  Vitals:   08/14/20 0908  TempSrc: Oral  PainSc: 0-No pain      Patients Stated Pain Goal: 0 (28/97/91 5041)  Complications: No complications documented.

## 2020-08-14 NOTE — Anesthesia Preprocedure Evaluation (Addendum)
Anesthesia Evaluation  Patient identified by MRN, date of birth, ID band Patient awake    Reviewed: Allergy & Precautions, NPO status , Patient's Chart, lab work & pertinent test results  Airway Mallampati: III  TM Distance: >3 FB Neck ROM: Full    Dental  (+) Chipped,    Pulmonary sleep apnea and Continuous Positive Airway Pressure Ventilation , former smoker,    Pulmonary exam normal breath sounds clear to auscultation       Cardiovascular hypertension, Pt. on medications Normal cardiovascular exam+ dysrhythmias Atrial Fibrillation  Rhythm:Regular Rate:Normal     Neuro/Psych Anxiety negative neurological ROS     GI/Hepatic PUD, Bowel prep,GERD  Medicated and Controlled,(+)     substance abuse  ,   Endo/Other  negative endocrine ROS  Renal/GU negative Renal ROS     Musculoskeletal  (+) Arthritis , narcotic dependentChronic LBP   Abdominal (+) + obese,   Peds  Hematology  (+) anemia , HLD   Anesthesia Other Findings severe IDA  Reproductive/Obstetrics                            Anesthesia Physical Anesthesia Plan  ASA: III  Anesthesia Plan: MAC   Post-op Pain Management:    Induction: Intravenous  PONV Risk Score and Plan: 1 and Propofol infusion and Treatment may vary due to age or medical condition  Airway Management Planned: Simple Face Mask  Additional Equipment:   Intra-op Plan:   Post-operative Plan:   Informed Consent: I have reviewed the patients History and Physical, chart, labs and discussed the procedure including the risks, benefits and alternatives for the proposed anesthesia with the patient or authorized representative who has indicated his/her understanding and acceptance.     Dental advisory given  Plan Discussed with: CRNA  Anesthesia Plan Comments:        Anesthesia Quick Evaluation

## 2020-08-14 NOTE — Progress Notes (Signed)
Pt got discharged to home, discharge instructions provided and patient showed understanding to it, IV taken out,Telemonitor DC,pt left unit in wheelchair with all of the belongings accompanied with a family member (wife)  Christobal Morado,RN 

## 2020-08-15 ENCOUNTER — Telehealth: Payer: Self-pay

## 2020-08-15 ENCOUNTER — Other Ambulatory Visit: Payer: Self-pay | Admitting: Cardiovascular Disease

## 2020-08-15 ENCOUNTER — Encounter: Payer: Self-pay | Admitting: Internal Medicine

## 2020-08-15 ENCOUNTER — Other Ambulatory Visit: Payer: Self-pay

## 2020-08-15 ENCOUNTER — Ambulatory Visit (INDEPENDENT_AMBULATORY_CARE_PROVIDER_SITE_OTHER): Payer: Medicare Other | Admitting: Internal Medicine

## 2020-08-15 DIAGNOSIS — R0600 Dyspnea, unspecified: Secondary | ICD-10-CM | POA: Diagnosis not present

## 2020-08-15 DIAGNOSIS — I10 Essential (primary) hypertension: Secondary | ICD-10-CM | POA: Diagnosis not present

## 2020-08-15 DIAGNOSIS — D649 Anemia, unspecified: Secondary | ICD-10-CM | POA: Diagnosis not present

## 2020-08-15 DIAGNOSIS — I480 Paroxysmal atrial fibrillation: Secondary | ICD-10-CM

## 2020-08-15 DIAGNOSIS — R195 Other fecal abnormalities: Secondary | ICD-10-CM

## 2020-08-15 DIAGNOSIS — E559 Vitamin D deficiency, unspecified: Secondary | ICD-10-CM

## 2020-08-15 DIAGNOSIS — K922 Gastrointestinal hemorrhage, unspecified: Secondary | ICD-10-CM | POA: Diagnosis not present

## 2020-08-15 DIAGNOSIS — D508 Other iron deficiency anemias: Secondary | ICD-10-CM

## 2020-08-15 DIAGNOSIS — R0609 Other forms of dyspnea: Secondary | ICD-10-CM

## 2020-08-15 MED ORDER — OXYCODONE HCL 15 MG PO TABS
15.0000 mg | ORAL_TABLET | Freq: Four times a day (QID) | ORAL | 0 refills | Status: DC
Start: 2020-08-15 — End: 2020-09-12

## 2020-08-15 NOTE — Assessment & Plan Note (Signed)
Better after RBC transfusion

## 2020-08-15 NOTE — Telephone Encounter (Signed)
-----   Message from [name], MD sent at 08/14/2020 10:10 AM EDT ----- He is likely going home today.  He needs outpatient capsule endoscopy for heme + IDA (negative recent colon today and EGD Start 2 weeks ago for Barrett's surveillance).  thanks

## 2020-08-15 NOTE — Addendum Note (Signed)
Addended by: Marlon Pel on: 08/15/2020 01:38 PM   Modules accepted: Orders

## 2020-08-15 NOTE — Assessment & Plan Note (Signed)
On Vit D 

## 2020-08-15 NOTE — Progress Notes (Signed)
Subjective:  Patient ID: Cody Frazier, male    DOB: 03/18/1946  Age: 74 y.o. MRN: [id number]  CC: No chief complaint on file.   HPI Cody Frazier presents for a post-hosp f/u for severe anemia - Hgb 3.5. Dr Fuller Plan performed a GI w/up. S/p 4 u RBC transfusion. Capsule endoscopy is pending. Per hosp d/c:   "Admit date: 08/12/2020 Discharge date: 08/14/2020  Time spent: 8mins, more than 50% time spent on coordination of care.   Recommendations for Outpatient Follow-up:  1. F/u with PCP within a week  for hospital discharge follow up, repeat cbc/bmp at follow up 2. F/u with gastroenterology  Discharge Diagnoses:      Active Hospital Problems   Diagnosis Date Noted  . Symptomatic anemia 08/12/2020  . Essential hypertension 08/10/2020  . Paroxysmal atrial fibrillation ([facility]) 05/15/2017  . Dyslipidemia 06/09/2007    Resolved Hospital Problems  No resolved problems to display.    Discharge Condition: stable  Diet recommendation: heart healthy       Filed Weights   08/12/20 2336 08/13/20 0500 08/14/20 0451  Weight: 111.2 kg 112.2 kg 107.5 kg    History of present illness: (Per admitting MD Dr. Doristine Bosworth) Chief Complaint:Low hemoglobin Cody Frazier:OIZTI W [name] a 74 y.o.malewith medical history significant ofparoxysmal A. fib-on Eliquis, obstructive sleep apnea, iron deficiency anemia, hypertension, hyperlipidemia, presents to emergency department for evaluation of low hemoglobin.  Patient reports worsening shortness of breath especially with exertion, generalized weakness, fatigue and has no energy. He was seen by cardiologist yesterday and labs were ordered which showed hemoglobin of 3.6 and was sent to ED for further evaluation and management.  He has history ofgastritis/reactive gastropathy-recent EGD done on 07/26/2020-which shows short segment Barrett's disease. Gastritis, small hiatal hernia, nonbleeding duodenal diverticulum.  He  deniesepigastric burning, over-the-counter use of NSAIDs, decreased appetite, weight loss,headache, blurry vision, chest pain, palpitations, fever, chills, nausea, vomiting, melena, hematemesis.Reports worsening leg swelling since 2 to 3 weeks. Compliant with diuretics. Denies orthopnea or PND. His last Eliquis dose was on 9/29.  ED Course:Upon arrival to ED: Patient's vital signs stable. Maintaining oxygen saturation on room air, CBC shows hemoglobin of 38/13.6, platelet: 481, CMP shows potassium of 3.4, CKD stage II-at baseline. EDP ordered 3 unit PRBC. Consulted GI. POC occult blood negative. [facility] hospitalist consulted for admission for symptomatic anemia.  Hospital Course:  Principal Problem:   Symptomatic anemia Active Problems:   Dyslipidemia   Paroxysmal atrial fibrillation (HCC)   Essential hypertension   Acute symptomatic anemia:Likely blood loss anemia from GI source Total bilirubin within normal limit,is not consistent with hemolytic anemia Hemoglobin 3.5 on presentation,report no overt GI bleed,recent EGD in September Status post PRBC transfusion x4, IV  Ironx1, hemoglobin 9.6 at discharge Hold Eliquis Colonoscopy no source of bleeding identified, he is cleared to discharge home per GI recommendation, GI plan to do capsule study at follow-up appointment -Appreciate GI input   Mild AKI on CKD 2 -BUN 14 creatinine 1.28 on presentation,BUN 6 creatinine 1.05 at discharge. -Creatinine improved after blood transfusion - renal dosing meds,Micardis held in the hospital, resumed at discharge  Chronic diastolic CHF: -Recent echocardiogram left ventricular EF 60 to 65% with grade 1 diastolic dysfunction -Continue home meds statin, metoprolol, Lasixevery other day.  -He does have some bilateral lower extremity pitting edema ,due to n.p.o. status in the hospital and getting bowel prep, he did not receive aggressive diuresis in hospital -He is continued on  home dose Lasix at discharge -He is  to follow-up with cardiology  Paroxysmal A. TGY:BWLSL rhythm currently. Continue metoprolol and Cardizem. Eliquis held in the hospital, resumed after colonoscopy per GI recommendation  Hypertension: Stable -Continue metoprolol -telmisartan held in the hospital, resumed at discharge"     Outpatient Medications Prior to Visit  Medication Sig Dispense Refill  . acetaminophen (TYLENOL) 500 MG tablet Take 1,000 mg by mouth every 6 (six) hours as needed for mild pain or moderate pain.    Marland Kitchen atorvastatin (LIPITOR) 20 MG tablet TAKE 1 TABLET BY MOUTH EVERY DAY (Patient taking differently: Take 20 mg by mouth daily. ) 90 tablet 3  . cetirizine (ZYRTEC) 10 MG tablet Take 10 mg by mouth daily.    . Cholecalciferol (VITAMIN D3) 1.25 MG (50000 UT) CAPS TAKE 1 CAPSULE BY MOUTH EVERY 30 (THIRTY) DAYS. (Patient taking differently: Take 1 capsule by mouth every 30 (thirty) days. On the 23rd of each month) 3 capsule 3  . diltiazem (CARDIZEM CD) 240 MG 24 hr capsule TAKE 1 CAPSULE BY MOUTH EVERY DAY (Patient taking differently: Take 240 mg by mouth daily. ) 90 capsule 3  . ELIQUIS 2.5 MG TABS tablet TAKE 1 TABLET BY MOUTH TWICE A DAY (Patient taking differently: Take 2.5 mg by mouth 2 (two) times daily. ) 60 tablet 11  . famotidine (PEPCID) 40 MG tablet Take 1 tablet (40 mg total) by mouth at bedtime. 30 tablet 11  . fluticasone (FLONASE) 50 MCG/ACT nasal spray SPRAY 2 SPRAYS INTO EACH NOSTRIL EVERY DAY (Patient taking differently: Place 2 sprays into both nostrils daily. ) 48 mL 5  . furosemide (LASIX) 40 MG tablet Take 1 tablet (40 mg total) by mouth every other day. 90 tablet 1  . latanoprost (XALATAN) 0.005 % ophthalmic solution Place 1 drop into both eyes at bedtime.  11  . metoprolol succinate (TOPROL-XL) 25 MG 24 hr tablet TAKE 1 TABLET BY MOUTH EVERY DAY (Patient taking differently: Take 25 mg by mouth daily. ) 90 tablet 2  . oxyCODONE (ROXICODONE) 15 MG  immediate release tablet Take 1 tablet (15 mg total) by mouth 4 (four) times daily. (Patient taking differently: Take 15 mg by mouth 2 (two) times daily as needed for pain. ) 120 tablet 0  . pantoprazole (PROTONIX) 40 MG tablet Take 1 tablet (40 mg total) by mouth 2 (two) times daily. 60 tablet 11  . polyethylene glycol (MIRALAX / GLYCOLAX) packet Take 17 g by mouth daily. 14 each 0  . PRESCRIPTION MEDICATION Inhale into the lungs at bedtime. CPAP    . telmisartan (MICARDIS) 20 MG tablet Take 1 tablet (20 mg total) by mouth daily. (Patient taking differently: Take 20 mg by mouth at bedtime. ) 90 tablet 1  . timolol (TIMOPTIC) 0.25 % ophthalmic solution Place 1 drop into both eyes 2 (two) times daily.     Marland Kitchen triamcinolone cream (KENALOG) 0.5 % Apply 1 application topically 3 (three) times daily. 90 g 1   No facility-administered medications prior to visit.    ROS: Review of Systems  Constitutional: Positive for fatigue. Negative for appetite change and unexpected weight change.  HENT: Negative for congestion, nosebleeds, sneezing, sore throat and trouble swallowing.   Eyes: Negative for itching and visual disturbance.  Respiratory: Positive for shortness of breath. Negative for cough.   Cardiovascular: Negative for chest pain, palpitations and leg swelling.  Gastrointestinal: Negative for abdominal distention, anal bleeding, blood in stool, diarrhea, nausea and vomiting.  Genitourinary: Negative for frequency and hematuria.  Musculoskeletal: Positive for back  pain and gait problem. Negative for joint swelling and neck pain.  Skin: Negative for rash.  Neurological: Negative for dizziness, tremors, speech difficulty and weakness.  Psychiatric/Behavioral: Negative for agitation, dysphoric mood and sleep disturbance. The patient is not nervous/anxious.     Objective:  BP (!) 122/58 (BP Location: Right Arm, Patient Position: Sitting, Cuff Size: Large)   Pulse 93   Temp 98.5 F (36.9 C) (Oral)    Ht 5\' 6"  (1.676 m)   Wt 241 lb (109.3 kg)   SpO2 97%   BMI 38.90 kg/m   BP Readings from Last 3 Encounters:  08/15/20 (!) 122/58  08/14/20 (!) 148/63  08/10/20 123/63    Wt Readings from Last 3 Encounters:  08/15/20 241 lb (109.3 kg)  08/14/20 236 lb 15.9 oz (107.5 kg)  08/10/20 244 lb (110.7 kg)    Physical Exam Constitutional:      General: He is not in acute distress.    Appearance: He is well-developed. He is obese.     Comments: NAD  Eyes:     Conjunctiva/sclera: Conjunctivae normal.     Pupils: Pupils are equal, round, and reactive to light.  Neck:     Thyroid: No thyromegaly.     Vascular: No JVD.  Cardiovascular:     Rate and Rhythm: Normal rate and regular rhythm.     Heart sounds: Normal heart sounds. No murmur heard.  No friction rub. No gallop.   Pulmonary:     Effort: Pulmonary effort is normal. No respiratory distress.     Breath sounds: Normal breath sounds. No wheezing or rales.  Chest:     Chest wall: No tenderness.  Abdominal:     General: Bowel sounds are normal. There is no distension.     Palpations: Abdomen is soft. There is no mass.     Tenderness: There is no abdominal tenderness. There is no guarding or rebound.  Musculoskeletal:        General: Tenderness present. Normal range of motion.     Cervical back: Normal range of motion.  Lymphadenopathy:     Cervical: No cervical adenopathy.  Skin:    General: Skin is warm and dry.     Findings: No rash.  Neurological:     Mental Status: He is alert and oriented to person, place, and time.     Cranial Nerves: No cranial nerve deficit.     Motor: No abnormal muscle tone.     Coordination: Coordination normal.     Gait: Gait normal.     Deep Tendon Reflexes: Reflexes are normal and symmetric.  Psychiatric:        Behavior: Behavior normal.        Thought Content: Thought content normal.        Judgment: Judgment normal.   Painful and stiff lower back  Lab Results  Component Value  Date   WBC 8.5 08/14/2020   HGB 9.6 (L) 08/14/2020   HCT 30.6 (L) 08/14/2020   PLT 411 (H) 08/14/2020   GLUCOSE 100 (H) 08/14/2020   CHOL 196 11/20/2018   TRIG 269.0 (H) 11/20/2018   HDL 43.60 11/20/2018   LDLDIRECT 109.0 11/20/2018   LDLCALC 86 11/25/2017   ALT 7 08/13/2020   AST 28 08/13/2020   NA 143 08/14/2020   K 3.9 08/14/2020   CL 105 08/14/2020   CREATININE 1.05 08/14/2020   BUN 6 (L) 08/14/2020   CO2 24 08/14/2020   TSH 1.04 05/23/2020   PSA 0.16  11/20/2018   INR 1.1 08/12/2020   HGBA1C 5.5 05/23/2020    DG Chest 2 View  Result Date: 08/13/2020 CLINICAL DATA:  Iron deficiency, anemia. EXAM: CHEST - 2 VIEW COMPARISON:  05/19/2018 FINDINGS: Low lung volumes. Mild diffuse interstitial prominence. No confluent airspace disease. Heart size upper limits normal for technique. Aortic Atherosclerosis (ICD10-170.0). Blunting of posterior costophrenic angles suggesting small effusions. No pneumothorax. Spondylitic changes near the thoracolumbar junction. IMPRESSION: Low volumes with small pleural effusions. Electronically Signed   By: Lucrezia Europe M.D.   On: 08/13/2020 14:14   CT ABDOMEN PELVIS W CONTRAST  Result Date: 08/13/2020 CLINICAL DATA:  Anemia.  History of gastric ulcers. EXAM: CT ABDOMEN AND PELVIS WITH CONTRAST TECHNIQUE: Multidetector CT imaging of the abdomen and pelvis was performed using the standard protocol following bolus administration of intravenous contrast. CONTRAST:  139mL OMNIPAQUE IOHEXOL 300 MG/ML  SOLN COMPARISON:  05/10/2017 FINDINGS: Lower chest: Coronary calcifications. New small pleural effusions, right greater than left. Dependent atelectasis in the posterior right lower lobe. Hepatobiliary: No focal liver abnormality is seen. Gallbladder unremarkable. CBD is mildly distended up to 12 mm diameter, seen down to the ampulla without radiodense calculus. Portal vein patent. Pancreas: Diffuse pancreatic atrophy without ductal dilatation. Spleen: Normal in size  without focal abnormality. Adrenals/Urinary Tract: Adrenal glands unremarkable. Bilateral renal cysts, stable since previous. No hydronephrosis. Urinary bladder physiologically distended. Stomach/Bowel: Postop changes of [place] 1. Stomach and small bowel are nondilated. Appendix surgically absent. The colon is nondilated, with scattered descending and sigmoid diverticula; no adjacent inflammatory change. Vascular/Lymphatic: Aortoiliac calcified atheromatous plaque without aneurysm or stenosis. Circumaortic left renal vein, an anatomic variant. Portal vein patent. No abdominal or pelvic adenopathy. Reproductive: Prostate is unremarkable. Other: Bilateral pelvic phleboliths.  No ascites.  No free air. Musculoskeletal: Thoracolumbar dextroscoliosis apex L2 multilevel thoracolumbar spondylitic change. Previous instrumented spinal fusion L1-S1. No fracture or worrisome bone lesion. IMPRESSION: 1. No acute findings. 2. New small bilateral pleural effusions, right greater than left. 3. Coronary and  Aortic Atherosclerosis (ICD10-I70.0). 4. Descending and sigmoid diverticulosis. Electronically Signed   By: Lucrezia Europe M.D.   On: 08/13/2020 14:12    Assessment & Plan:   There are no diagnoses linked to this encounter.   No orders of the defined types were placed in this encounter.    Follow-up: No follow-ups on file.  Walker Kehr, MD

## 2020-08-15 NOTE — Assessment & Plan Note (Signed)
Wt Readings from Last 3 Encounters:  08/15/20 241 lb (109.3 kg)  08/14/20 236 lb 15.9 oz (107.5 kg)  08/10/20 244 lb (110.7 kg)

## 2020-08-15 NOTE — Assessment & Plan Note (Signed)
On Eliquis

## 2020-08-15 NOTE — Assessment & Plan Note (Signed)
S/p 4 u of RBC transfusion

## 2020-08-15 NOTE — Assessment & Plan Note (Signed)
a post-hosp f/u for severe anemia - Hgb 3.5. Dr Fuller Plan performed a GI w/up. S/p 4 u RBC transfusion. Capsule endoscopy is pending.

## 2020-08-15 NOTE — Telephone Encounter (Signed)
Patient has been scheduled for capsule endo on 08/22/20 with his wife.  Instructions sent to him via MyChart.  They will call or reply if they have questions.

## 2020-08-17 ENCOUNTER — Other Ambulatory Visit: Payer: Self-pay | Admitting: *Deleted

## 2020-08-17 NOTE — Patient Outreach (Signed)
[facility]) Care Management  08/17/2020  Cody Frazier [facility] 07/29/1946 [id number]   RED ON EMMI ALERT - General Discharge Day # 1 Date: 10/5 Red Alert Reason: Unfilled prescriptions   Outreach attempt #1, unsuccessful.  HIPAA complaint voice message left.   Plan: RN CM will send unsuccessful outreach letter and follow up within the next 3-4 business days.  Valente David, [place], MSN [place] [phone]

## 2020-08-19 ENCOUNTER — Telehealth: Payer: Self-pay | Admitting: Cardiovascular Disease

## 2020-08-19 NOTE — Telephone Encounter (Signed)
Pt aware of recommendations and agrees. Per pt has plenty of the 20 mg and thinks may have a lot of the 40 mg Telmisartan .Will call office when needs refill./cy

## 2020-08-19 NOTE — Telephone Encounter (Signed)
OK to increase telmisartan to 40 mg daily. Please send new Rx, but he can double up on the 20 mg tabs for now.

## 2020-08-19 NOTE — Telephone Encounter (Signed)
Spoke with pt and for the last week B/P has been running 150/80 and this is high per pt  Pt states had taken an extra telmisartan 20 mg yesterday  Per pt weight stable , swelling is better not sure why B/P is creeping up Will forward to Dr Sallyanne Kuster for review and recommendations ./cy

## 2020-08-19 NOTE — Telephone Encounter (Signed)
Patient is requesting to discuss telmisartan (MICARDIS) 20 MG tablet medication instructions. Please call.

## 2020-08-22 ENCOUNTER — Other Ambulatory Visit: Payer: Self-pay | Admitting: *Deleted

## 2020-08-22 ENCOUNTER — Encounter: Payer: Self-pay | Admitting: Gastroenterology

## 2020-08-22 ENCOUNTER — Ambulatory Visit (INDEPENDENT_AMBULATORY_CARE_PROVIDER_SITE_OTHER): Payer: Medicare Other | Admitting: Gastroenterology

## 2020-08-22 DIAGNOSIS — D500 Iron deficiency anemia secondary to blood loss (chronic): Secondary | ICD-10-CM | POA: Diagnosis not present

## 2020-08-22 DIAGNOSIS — R195 Other fecal abnormalities: Secondary | ICD-10-CM

## 2020-08-22 NOTE — Patient Instructions (Signed)
You may have clear liquids beginning at 10:30 am after ingesting the capsule.    At 1:00 pm you may have a light lunch of half a sandwich and a bowl of soup. You may have a normal dinner after 5:00 pm.  No further dietary restrictions are necessary.  You should pass the capsule in your stool 8-48 hours after ingestion. If you have not passed the capsule, after 72 hours, please contact the office at [phone].  Call Shawnetta Lein at [phone] if you have any questions.

## 2020-08-22 NOTE — Patient Outreach (Signed)
[facility]) Care Management  08/22/2020  [facility] 1946/01/06 [id number]    IRED ON EMMI ALERT - General Discharge Day # 1 Date: 10/5 Red Alert Reason: Unfilled prescriptions   Outreach attempt #2, successful.  Identity verified.  This care manager introduced self and stated purpose of call.  [facility] care management services explained.    Member report he was in the hospital for symptomatic anemia, state he is feeling much better.  Denies having any lingering shortness of breath.  Had test done today, will have a follow up appointment with MD within the next couple weeks.  Report he does have all prescriptions despite above noted red flag.  Discussed need for ongoing involvement with [facility], he denies need, declines service.  Will not open case at this time.  Encouraged to contact this care manager with questions, if needs change.  Cody Frazier, [place], MSN [place] [phone]

## 2020-08-22 NOTE — Progress Notes (Signed)
Pt arrived appropriately prepped for capsule.  He verbalized understanding of all the written and verbal instructions.  He will return at 4 to have equipment removed.    Capsule [id number] EXP 2021-11-30 Lot [id number]

## 2020-08-28 DIAGNOSIS — Z23 Encounter for immunization: Secondary | ICD-10-CM | POA: Diagnosis not present

## 2020-09-05 ENCOUNTER — Telehealth: Payer: Self-pay

## 2020-09-05 DIAGNOSIS — D500 Iron deficiency anemia secondary to blood loss (chronic): Secondary | ICD-10-CM

## 2020-09-05 NOTE — Telephone Encounter (Signed)
Left message for patient to call back to discuss results of the capsule endoscopy.  Capsule was retained in his stomach for the duration of the study.  Dr. Fuller Plan advised that severe gastritis on recent EGD could explain the Iron deficiency.  Per Dr. Fuller Plan could consider endoscopic placement and repeat Capsule endo or continue to monitor .    Dr. Fuller Plan.  I can't see his is on oral iron.  Please advise if he needs oral and appropriate dosing

## 2020-09-05 NOTE — Telephone Encounter (Signed)
FeSO4 325 mg po bid with meals, 3 months supply

## 2020-09-06 ENCOUNTER — Other Ambulatory Visit: Payer: Self-pay

## 2020-09-06 ENCOUNTER — Ambulatory Visit (INDEPENDENT_AMBULATORY_CARE_PROVIDER_SITE_OTHER): Payer: Medicare Other | Admitting: Cardiovascular Disease

## 2020-09-06 ENCOUNTER — Encounter: Payer: Self-pay | Admitting: Cardiovascular Disease

## 2020-09-06 VITALS — BP 162/92 | HR 83 | Ht 66.0 in | Wt 238.8 lb

## 2020-09-06 DIAGNOSIS — I10 Essential (primary) hypertension: Secondary | ICD-10-CM

## 2020-09-06 DIAGNOSIS — D509 Iron deficiency anemia, unspecified: Secondary | ICD-10-CM

## 2020-09-06 DIAGNOSIS — I350 Nonrheumatic aortic (valve) stenosis: Secondary | ICD-10-CM

## 2020-09-06 DIAGNOSIS — I700 Atherosclerosis of aorta: Secondary | ICD-10-CM | POA: Diagnosis not present

## 2020-09-06 DIAGNOSIS — Z9989 Dependence on other enabling machines and devices: Secondary | ICD-10-CM

## 2020-09-06 DIAGNOSIS — I480 Paroxysmal atrial fibrillation: Secondary | ICD-10-CM

## 2020-09-06 DIAGNOSIS — Z7901 Long term (current) use of anticoagulants: Secondary | ICD-10-CM | POA: Diagnosis not present

## 2020-09-06 DIAGNOSIS — I2721 Secondary pulmonary arterial hypertension: Secondary | ICD-10-CM | POA: Diagnosis not present

## 2020-09-06 DIAGNOSIS — G4733 Obstructive sleep apnea (adult) (pediatric): Secondary | ICD-10-CM | POA: Diagnosis not present

## 2020-09-06 DIAGNOSIS — I5032 Chronic diastolic (congestive) heart failure: Secondary | ICD-10-CM | POA: Diagnosis not present

## 2020-09-06 MED ORDER — FUROSEMIDE 40 MG PO TABS: 60.0000 mg | ORAL_TABLET | Freq: Every day | ORAL | 5 refills | Status: DC

## 2020-09-06 MED ORDER — TELMISARTAN 80 MG PO TABS: 80.0000 mg | ORAL_TABLET | Freq: Every day | ORAL | 1 refills | Status: DC

## 2020-09-06 MED ORDER — IRON 325 (65 FE) MG PO TABS: 325.0000 mg | ORAL_TABLET | Freq: Two times a day (BID) | ORAL | 0 refills | Status: DC

## 2020-09-06 MED ORDER — SPIRONOLACTONE 25 MG PO TABS: 25.0000 mg | ORAL_TABLET | Freq: Every day | ORAL | 1 refills | Status: DC
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
## 2020-09-06 NOTE — Telephone Encounter (Signed)
Patient notified

## 2020-09-06 NOTE — Telephone Encounter (Signed)
Pt is requesting a call back from [place], pt missed phone call

## 2020-09-06 NOTE — Patient Instructions (Signed)
Medication Instructions:  INCREASE the Furosemide to 60 mg once daily (tablet an a half) START Spironolactone 25 mg once daily INCREASE the Telmisartan to 80 mg once daily   *If you need a refill on your cardiac medications before your next appointment, please call your pharmacy*   Lab Work: Your provider would like for you to return in 2 weeks to have the following labs drawn: BMET and CBC. You do not need an appointment for the lab. Once in our office lobby there is a podium where you can sign in and ring the doorbell to alert [place] that you are here. The lab is open from 8:00 am to 4:30 pm; closed for lunch from 12:45pm-1:45pm.   If you have labs (blood work) drawn today and your tests are completely normal, you will receive your results only by: Marland Kitchen MyChart Message (if you have MyChart) OR . A paper copy in the mail If you have any lab test that is abnormal or we need to change your treatment, we will call you to review the results.   Testing/Procedures: None ordered   Follow-Up: At [facility], you and your health needs are our priority.  As part of our continuing mission to provide you with exceptional heart care, we have created designated Provider Care Teams.  These Care Teams include your primary Cardiologist (physician) and Advanced Practice Providers (APPs -  Physician Assistants and Nurse Practitioners) who all work together to provide you with the care you need, when you need it.  We recommend signing up for the patient portal called "MyChart".  Sign up information is provided on this After Visit Summary.  MyChart is used to connect with patients for Virtual Visits (Telemedicine).  Patients are able to view lab/test results, encounter notes, upcoming appointments, etc.  Non-urgent messages can be sent to your provider as well.   To learn more about what you can do with MyChart, go to [email or web address].    Your next appointment:   Follow up in 4-6 weeks with APP Follow  up in 3 months with Dr. Sallyanne Kuster.

## 2020-09-06 NOTE — Telephone Encounter (Signed)
Patient notified of the results and recommendations.  He would like try the iron tx for now.  He is traveling to [place] at Thanksgiving.  Per Dr. Fuller Plan he will need CBC a week before his travel.      Left message for patient to call back

## 2020-09-06 NOTE — Addendum Note (Signed)
Addended by: Marlon Pel on: 09/06/2020 10:22 AM   Modules accepted: Orders

## 2020-09-12 ENCOUNTER — Encounter: Payer: Self-pay | Admitting: Cardiovascular Disease

## 2020-09-12 ENCOUNTER — Ambulatory Visit (INDEPENDENT_AMBULATORY_CARE_PROVIDER_SITE_OTHER): Payer: Medicare Other | Admitting: Cardiovascular Disease

## 2020-09-12 ENCOUNTER — Other Ambulatory Visit: Payer: Self-pay

## 2020-09-12 ENCOUNTER — Ambulatory Visit (INDEPENDENT_AMBULATORY_CARE_PROVIDER_SITE_OTHER): Payer: Medicare Other | Admitting: Internal Medicine

## 2020-09-12 ENCOUNTER — Encounter: Payer: Self-pay | Admitting: Internal Medicine

## 2020-09-12 DIAGNOSIS — I480 Paroxysmal atrial fibrillation: Secondary | ICD-10-CM

## 2020-09-12 DIAGNOSIS — D485 Neoplasm of uncertain behavior of skin: Secondary | ICD-10-CM | POA: Diagnosis not present

## 2020-09-12 DIAGNOSIS — H918X3 Other specified hearing loss, bilateral: Secondary | ICD-10-CM | POA: Diagnosis not present

## 2020-09-12 DIAGNOSIS — D500 Iron deficiency anemia secondary to blood loss (chronic): Secondary | ICD-10-CM | POA: Diagnosis not present

## 2020-09-12 DIAGNOSIS — D649 Anemia, unspecified: Secondary | ICD-10-CM

## 2020-09-12 DIAGNOSIS — Z9989 Dependence on other enabling machines and devices: Secondary | ICD-10-CM | POA: Diagnosis not present

## 2020-09-12 DIAGNOSIS — G4733 Obstructive sleep apnea (adult) (pediatric): Secondary | ICD-10-CM

## 2020-09-12 DIAGNOSIS — M519 Unspecified thoracic, thoracolumbar and lumbosacral intervertebral disc disorder: Secondary | ICD-10-CM

## 2020-09-12 DIAGNOSIS — E785 Hyperlipidemia, unspecified: Secondary | ICD-10-CM | POA: Diagnosis not present

## 2020-09-12 DIAGNOSIS — H9190 Unspecified hearing loss, unspecified ear: Secondary | ICD-10-CM | POA: Insufficient documentation

## 2020-09-12 DIAGNOSIS — R413 Other amnesia: Secondary | ICD-10-CM | POA: Diagnosis not present

## 2020-09-12 DIAGNOSIS — E559 Vitamin D deficiency, unspecified: Secondary | ICD-10-CM | POA: Diagnosis not present

## 2020-09-12 DIAGNOSIS — Z6835 Body mass index (BMI) 35.0-35.9, adult: Secondary | ICD-10-CM

## 2020-09-12 DIAGNOSIS — Z7901 Long term (current) use of anticoagulants: Secondary | ICD-10-CM

## 2020-09-12 DIAGNOSIS — I10 Essential (primary) hypertension: Secondary | ICD-10-CM

## 2020-09-12 MED ORDER — OXYCODONE HCL 15 MG PO TABS
15.0000 mg | ORAL_TABLET | Freq: Four times a day (QID) | ORAL | 0 refills | Status: DC
Start: 2020-09-12 — End: 2020-10-25

## 2020-09-12 NOTE — Assessment & Plan Note (Signed)
No active bleeding 

## 2020-09-12 NOTE — Assessment & Plan Note (Signed)
Lost wt °

## 2020-09-12 NOTE — Assessment & Plan Note (Signed)
try Lion's Mane Mushroom extract or capsules for memory Get hearing aids

## 2020-09-12 NOTE — Progress Notes (Signed)
Subjective:  Patient ID: Cody Frazier, male    DOB: June 25, 1946  Age: 74 y.o. MRN: [id number]  CC: Follow-up and Hearing Problem (bilateral)   HPI Cody Frazier presents for anemia, LBP, obesity C/o hearing loss, memory issues  Outpatient Medications Prior to Visit  Medication Sig Dispense Refill  . acetaminophen (TYLENOL) 500 MG tablet Take 1,000 mg by mouth every 6 (six) hours as needed for mild pain or moderate pain.    Marland Kitchen atorvastatin (LIPITOR) 20 MG tablet TAKE 1 TABLET BY MOUTH EVERY DAY 90 tablet 3  . cetirizine (ZYRTEC) 10 MG tablet Take 10 mg by mouth daily.    . Cholecalciferol (VITAMIN D3) 1.25 MG (50000 UT) CAPS TAKE 1 CAPSULE BY MOUTH EVERY 30 (THIRTY) DAYS. (Patient taking differently: Take 1 capsule by mouth every 30 (thirty) days. On the 23rd of each month) 3 capsule 3  . diltiazem (CARDIZEM CD) 240 MG 24 hr capsule TAKE 1 CAPSULE BY MOUTH EVERY DAY (Patient taking differently: Take 240 mg by mouth daily. ) 90 capsule 3  . ELIQUIS 2.5 MG TABS tablet TAKE 1 TABLET BY MOUTH TWICE A DAY (Patient taking differently: Take 2.5 mg by mouth 2 (two) times daily. ) 60 tablet 11  . famotidine (PEPCID) 40 MG tablet Take 1 tablet (40 mg total) by mouth at bedtime. 30 tablet 11  . Ferrous Sulfate (IRON) 325 (65 Fe) MG TABS Take 1 tablet (325 mg total) by mouth 2 (two) times daily with a meal. 180 tablet 0  . fluticasone (FLONASE) 50 MCG/ACT nasal spray SPRAY 2 SPRAYS INTO EACH NOSTRIL EVERY DAY (Patient taking differently: Place 2 sprays into both nostrils daily. ) 48 mL 5  . furosemide (LASIX) 40 MG tablet Take 1.5 tablets (60 mg total) by mouth daily. 45 tablet 5  . latanoprost (XALATAN) 0.005 % ophthalmic solution Place 1 drop into both eyes at bedtime.  11  . metoprolol succinate (TOPROL-XL) 25 MG 24 hr tablet TAKE 1 TABLET BY MOUTH EVERY DAY (Patient taking differently: Take 25 mg by mouth daily. ) 90 tablet 2  . oxyCODONE (ROXICODONE) 15 MG immediate release tablet Take 1 tablet  (15 mg total) by mouth 4 (four) times daily. 120 tablet 0  . pantoprazole (PROTONIX) 40 MG tablet Take 1 tablet (40 mg total) by mouth 2 (two) times daily. 60 tablet 11  . polyethylene glycol (MIRALAX / GLYCOLAX) packet Take 17 g by mouth daily. 14 each 0  . PRESCRIPTION MEDICATION Inhale into the lungs at bedtime. CPAP    . spironolactone (ALDACTONE) 25 MG tablet Take 1 tablet (25 mg total) by mouth daily. 90 tablet 1  . telmisartan (MICARDIS) 80 MG tablet Take 1 tablet (80 mg total) by mouth daily. 90 tablet 1  . timolol (TIMOPTIC) 0.25 % ophthalmic solution Place 1 drop into both eyes 2 (two) times daily.     Marland Kitchen triamcinolone cream (KENALOG) 0.5 % Apply 1 application topically 3 (three) times daily. 90 g 1   No facility-administered medications prior to visit.    ROS: Review of Systems  Constitutional: Positive for fatigue. Negative for appetite change and unexpected weight change.  HENT: Negative for congestion, nosebleeds, sneezing, sore throat and trouble swallowing.   Eyes: Negative for itching and visual disturbance.  Respiratory: Negative for cough.   Cardiovascular: Negative for chest pain, palpitations and leg swelling.  Gastrointestinal: Negative for abdominal distention, blood in stool, diarrhea and nausea.  Genitourinary: Negative for frequency and hematuria.  Musculoskeletal: Positive for  back pain and gait problem. Negative for joint swelling and neck pain.  Skin: Negative for rash.  Neurological: Negative for dizziness, tremors, speech difficulty and weakness.  Psychiatric/Behavioral: Positive for decreased concentration. Negative for agitation, dysphoric mood, sleep disturbance and suicidal ideas. The patient is not nervous/anxious.     Objective:  BP 130/78 (BP Location: Left Arm, Patient Position: Sitting, Cuff Size: Large)   Pulse 83   Temp 98.9 F (37.2 C) (Oral)   Wt 230 lb 12.8 oz (104.7 kg)   SpO2 96%   BMI 37.25 kg/m   BP Readings from Last 3 Encounters:    09/12/20 130/78  09/06/20 (!) 162/92  08/15/20 (!) 122/58    Wt Readings from Last 3 Encounters:  09/12/20 230 lb 12.8 oz (104.7 kg)  09/06/20 238 lb 12.8 oz (108.3 kg)  08/15/20 241 lb (109.3 kg)    Physical Exam Constitutional:      General: He is not in acute distress.    Appearance: He is well-developed. He is obese.     Comments: NAD  Eyes:     Conjunctiva/sclera: Conjunctivae normal.     Pupils: Pupils are equal, round, and reactive to light.  Neck:     Thyroid: No thyromegaly.     Vascular: No JVD.  Cardiovascular:     Rate and Rhythm: Normal rate and regular rhythm.     Heart sounds: Normal heart sounds. No murmur heard.  No friction rub. No gallop.   Pulmonary:     Effort: Pulmonary effort is normal. No respiratory distress.     Breath sounds: Normal breath sounds. No wheezing or rales.  Chest:     Chest wall: No tenderness.  Abdominal:     General: Bowel sounds are normal. There is no distension.     Palpations: Abdomen is soft. There is no mass.     Tenderness: There is no abdominal tenderness. There is no guarding or rebound.  Musculoskeletal:        General: No tenderness. Normal range of motion.     Cervical back: Normal range of motion.     Right lower leg: Edema present.     Left lower leg: Edema present.  Lymphadenopathy:     Cervical: No cervical adenopathy.  Skin:    General: Skin is warm and dry.     Findings: No rash.  Neurological:     Mental Status: He is alert and oriented to person, place, and time.     Cranial Nerves: No cranial nerve deficit.     Motor: No abnormal muscle tone.     Coordination: Coordination normal.     Gait: Gait normal.     Deep Tendon Reflexes: Reflexes are normal and symmetric.  Psychiatric:        Behavior: Behavior normal.        Thought Content: Thought content normal.        Judgment: Judgment normal.     Lab Results  Component Value Date   WBC 8.5 08/14/2020   HGB 9.6 (L) 08/14/2020   HCT 30.6 (L)  08/14/2020   PLT 411 (H) 08/14/2020   GLUCOSE 100 (H) 08/14/2020   CHOL 196 11/20/2018   TRIG 269.0 (H) 11/20/2018   HDL 43.60 11/20/2018   LDLDIRECT 109.0 11/20/2018   LDLCALC 86 11/25/2017   ALT 7 08/13/2020   AST 28 08/13/2020   NA 143 08/14/2020   K 3.9 08/14/2020   CL 105 08/14/2020   CREATININE 1.05 08/14/2020  BUN 6 (L) 08/14/2020   CO2 24 08/14/2020   TSH 1.04 05/23/2020   PSA 0.16 11/20/2018   INR 1.1 08/12/2020   HGBA1C 5.5 05/23/2020    DG Chest 2 View  Result Date: 08/13/2020 CLINICAL DATA:  Iron deficiency, anemia. EXAM: CHEST - 2 VIEW COMPARISON:  05/19/2018 FINDINGS: Low lung volumes. Mild diffuse interstitial prominence. No confluent airspace disease. Heart size upper limits normal for technique. Aortic Atherosclerosis (ICD10-170.0). Blunting of posterior costophrenic angles suggesting small effusions. No pneumothorax. Spondylitic changes near the thoracolumbar junction. IMPRESSION: Low volumes with small pleural effusions. Electronically Signed   By: Lucrezia Europe M.D.   On: 08/13/2020 14:14   CT ABDOMEN PELVIS W CONTRAST  Result Date: 08/13/2020 CLINICAL DATA:  Anemia.  History of gastric ulcers. EXAM: CT ABDOMEN AND PELVIS WITH CONTRAST TECHNIQUE: Multidetector CT imaging of the abdomen and pelvis was performed using the standard protocol following bolus administration of intravenous contrast. CONTRAST:  151mL OMNIPAQUE IOHEXOL 300 MG/ML  SOLN COMPARISON:  05/10/2017 FINDINGS: Lower chest: Coronary calcifications. New small pleural effusions, right greater than left. Dependent atelectasis in the posterior right lower lobe. Hepatobiliary: No focal liver abnormality is seen. Gallbladder unremarkable. CBD is mildly distended up to 12 mm diameter, seen down to the ampulla without radiodense calculus. Portal vein patent. Pancreas: Diffuse pancreatic atrophy without ductal dilatation. Spleen: Normal in size without focal abnormality. Adrenals/Urinary Tract: Adrenal glands  unremarkable. Bilateral renal cysts, stable since previous. No hydronephrosis. Urinary bladder physiologically distended. Stomach/Bowel: Postop changes of [place] 1. Stomach and small bowel are nondilated. Appendix surgically absent. The colon is nondilated, with scattered descending and sigmoid diverticula; no adjacent inflammatory change. Vascular/Lymphatic: Aortoiliac calcified atheromatous plaque without aneurysm or stenosis. Circumaortic left renal vein, an anatomic variant. Portal vein patent. No abdominal or pelvic adenopathy. Reproductive: Prostate is unremarkable. Other: Bilateral pelvic phleboliths.  No ascites.  No free air. Musculoskeletal: Thoracolumbar dextroscoliosis apex L2 multilevel thoracolumbar spondylitic change. Previous instrumented spinal fusion L1-S1. No fracture or worrisome bone lesion. IMPRESSION: 1. No acute findings. 2. New small bilateral pleural effusions, right greater than left. 3. Coronary and  Aortic Atherosclerosis (ICD10-I70.0). 4. Descending and sigmoid diverticulosis. Electronically Signed   By: Lucrezia Europe M.D.   On: 08/13/2020 14:12    Assessment & Plan:   There are no diagnoses linked to this encounter.   No orders of the defined types were placed in this encounter.    Follow-up: No follow-ups on file.  Walker Kehr, MD

## 2020-09-12 NOTE — Assessment & Plan Note (Signed)
AKs, SKs -- bx later

## 2020-09-12 NOTE — Assessment & Plan Note (Signed)
Vit D 

## 2020-09-12 NOTE — Patient Instructions (Signed)
Follow-Up: At [facility], you and your health needs are our priority.  As part of our continuing mission to provide you with exceptional heart care, we have created designated Provider Care Teams.  These Care Teams include your primary Cardiologist (physician) and Advanced Practice Providers (APPs -  Physician Assistants and Nurse Practitioners) who all work together to provide you with the care you need, when you need it.  We recommend signing up for the patient portal called "MyChart".  Sign up information is provided on this After Visit Summary.  MyChart is used to connect with patients for Virtual Visits (Telemedicine).  Patients are able to view lab/test results, encounter notes, upcoming appointments, etc.  Non-urgent messages can be sent to your provider as well.   To learn more about what you can do with MyChart, go to [email or web address].    Your next appointment:   12 month(s)  (sleep clinic)  The format for your next appointment:   In Person  Provider:   Shelva Majestic, MD

## 2020-09-12 NOTE — Progress Notes (Signed)
Cardiology Office Note    Date:  09/18/2020   ID:  [name], [name] 28-Dec-1945, MRN [id number]  PCP:  Cassandria Anger, MD  Cardiologist:  Shelva Majestic, MD (sleep); Dr. Sallyanne Kuster  F/U sleep evaluation  History of Present Illness:  Cody Frazier is a 74 y.o. male who presents a one year sleep evaluation.   Cody Frazier is a former patient of Dr. Wynonia Lawman who now sees Dr. Sallyanne Kuster.  He has a history of hypertension, hyperlipidemia, and paroxysmal atrial fibrillation on anticoagulation therapy.  He underwent an echo Doppler study in February 2020 which showed an EF of 60 to 65% with grade 1 diastolic dysfunction.  There was mild aortic sclerosis, mild pulmonary regurgitation and mild dilation of his ascending aorta.  Due to concerns for obstructive sleep apnea he was referred for a sleep study.  Insurance denied an in lab study and he initially underwent a home sleep evaluation on January 28, 2019.  The home study demonstrated severe sleep apnea with an AHI of 50.9/h.  Events were worse with this supine sleep with AHI 53.9/h versus nonsupine sleep at 26.35/h.  He had severe oxygen desaturation to a nadir of 75%.  Time spent under 89% was 83 minutes.  He snored for 12.4% of his sleep.  He was referred for CPAP titration and fortunately insurance approved an in lab study which was done on April 30, 2019.  CPAP was increased to 13 cm water pressure with excellent result with an AHI of 0 and oxygen nadir at 91%.  His CPAP set up date was May 25, 2019 with [facility] as his DME company.  He has been using a full facemask.  A download was obtained in the office today from July 14, 2019 through August 12, 2019.  This reveals excellent compliance with 100% usage days and average usage at 7 hours and 44 minutes.  At his 13 cm set pressure, AHI is 2.7.  There is no mask leak.  Since initiating CPAP therapy, he notes significant improvement in his sleep.  He denies frequent awakenings.  He is  unaware of breakthrough snoring.  Previous nocturia 3 times per night has essentially resolved.  He is dreaming.  Most nights he sleeps without waking up.  He denies any hypnagogic hallucinations or sleep paralysis.  He denies any residual daytime sleepiness.  An Epworth Sleepiness Scale score was calculated in the office today and this endorsed at 5 arguing against residual daytime sleepiness.  He typically goes to bed between 11 and 1130 each night and wakes up between 7 and 8 AM.  When I initially saw him he was on diltiazem 240 mg, metoprolol 25 mg daily and telmisartan 80 mg for hypertension as well as his PAF and  Eliquis anticoagulation.  He wason atorvastatin 20 mg daily for hyperlipidemia.  Over the past year, he has continued to do well.  He has been using CPAP with continued excellent compliance.  I obtained a new download from October 2 through September 11, 2020.  He was averaging 9 hours and 25 minutes of CPAP use per night.  AHI was 1.8 at a 13 cm CPAP pressure.  He has a Microbiologist F 30i mask.  There was no leak with the exception of one night.  He believes he is sleeping well.  He will be is going to see his son who is currently living in [place],  [place].  He was hospitalized from October 1 through  October 3 with increasing shortness of breath in the setting of low hemoglobin.  Occult blood was negative and colonoscopy did not reveal any source of bleeding.  He was maintaining sinus rhythm I did not have recurrent atrial fibrillation.  During his hospitalization telmisartan was held in the hospital but resumed at discharge and metoprolol was continued.   Past Medical History:  Diagnosis Date  . Allergic rhinitis   . Anemia, iron deficiency   . Arthritis   . Atrial arrhythmia   . Atrial fib/flutter, transient    following prior surgeries x 2  . Barrett's esophagus without dysplasia   . BPH (benign prostatic hyperplasia)   . Bright's disease   . Chronic LBP   . Closed fracture of  right distal radius   . DDD (degenerative disc disease)   . Diverticulosis of colon   . Duodenal stricture   . Gastric outlet obstruction   . GERD (gastroesophageal reflux disease)   . Glaucoma   . Hemorrhoids   . History of kidney stones   . Hyperlipidemia   . Hyperplastic colonic polyp 01/2000  . Hypertension   . Nephrolithiasis    hx of B  . Peptic ulcer disease with hemorrhage 08/2008   and [facility]. Pylori Ab negative  . Renal cyst   . Sleep apnea with use of continuous positive airway pressure (CPAP)   . Tubular adenoma of colon 03/2012  . Wears glasses     Past Surgical History:  Procedure Laterality Date  . APPENDECTOMY    . BACK SURGERY  02/2003   L5  . BILROTH I PROCEDURE  2011   Dr Johney Maine  . CLOSED REDUCTION WRIST FRACTURE Right 01/06/2019   Procedure: Closed Reduction Wrist/Pinning Wrist;  Surgeon: Dayna Barker, MD;  Location: [facility];  Service: Plastics;  Laterality: Right;  . COLONOSCOPY    . COLONOSCOPY WITH PROPOFOL N/A 08/14/2020   Procedure: COLONOSCOPY WITH PROPOFOL;  Surgeon: [name], MD;  Location: [facility] ENDOSCOPY;  Service: Endoscopy;  Laterality: N/A;  . HIATAL HERNIA REPAIR    . LAPAROTOMY N/A 05/10/2017   Procedure: EXPLORATORY LAPAROTOMY WITH ENTEROLYSIS;  Surgeon: Johnathan Hausen, MD;  Location: [facility];  Service: General;  Laterality: N/A;  . LUMBAR FUSION  2009  . SMALL INTESTINE SURGERY    . TONSILLECTOMY    . UPPER GASTROINTESTINAL ENDOSCOPY  07/26/2020    Current Medications: Outpatient Medications Prior to Visit  Medication Sig Dispense Refill  . acetaminophen (TYLENOL) 500 MG tablet Take 1,000 mg by mouth every 6 (six) hours as needed for mild pain or moderate pain.    Marland Kitchen atorvastatin (LIPITOR) 20 MG tablet TAKE 1 TABLET BY MOUTH EVERY DAY 90 tablet 3  . cetirizine (ZYRTEC) 10 MG tablet Take 10 mg by mouth daily.    . Cholecalciferol (VITAMIN D3) 1.25 MG (50000 UT) CAPS TAKE 1 CAPSULE BY MOUTH EVERY 30 (THIRTY) DAYS. 3 capsule 3  .  diltiazem (CARDIZEM CD) 240 MG 24 hr capsule TAKE 1 CAPSULE BY MOUTH EVERY DAY 90 capsule 3  . ELIQUIS 2.5 MG TABS tablet TAKE 1 TABLET BY MOUTH TWICE A DAY 60 tablet 11  . famotidine (PEPCID) 40 MG tablet Take 1 tablet (40 mg total) by mouth at bedtime. 30 tablet 11  . Ferrous Sulfate (IRON) 325 (65 Fe) MG TABS Take 1 tablet (325 mg total) by mouth 2 (two) times daily with a meal. 180 tablet 0  . fluticasone (FLONASE) 50 MCG/ACT nasal spray SPRAY 2 SPRAYS INTO  EACH NOSTRIL EVERY DAY 48 mL 5  . furosemide (LASIX) 40 MG tablet Take 1.5 tablets (60 mg total) by mouth daily. 45 tablet 5  . latanoprost (XALATAN) 0.005 % ophthalmic solution Place 1 drop into both eyes at bedtime.  11  . metoprolol succinate (TOPROL-XL) 25 MG 24 hr tablet TAKE 1 TABLET BY MOUTH EVERY DAY 90 tablet 2  . oxyCODONE (ROXICODONE) 15 MG immediate release tablet Take 1 tablet (15 mg total) by mouth 4 (four) times daily. 120 tablet 0  . pantoprazole (PROTONIX) 40 MG tablet Take 1 tablet (40 mg total) by mouth 2 (two) times daily. 60 tablet 11  . polyethylene glycol (MIRALAX / GLYCOLAX) packet Take 17 g by mouth daily. 14 each 0  . PRESCRIPTION MEDICATION Inhale into the lungs at bedtime. CPAP    . spironolactone (ALDACTONE) 25 MG tablet Take 1 tablet (25 mg total) by mouth daily. 90 tablet 1  . telmisartan (MICARDIS) 80 MG tablet Take 1 tablet (80 mg total) by mouth daily. 90 tablet 1  . timolol (TIMOPTIC) 0.25 % ophthalmic solution Place 1 drop into both eyes 2 (two) times daily.     Marland Kitchen triamcinolone cream (KENALOG) 0.5 % Apply 1 application topically 3 (three) times daily. 90 g 1   No facility-administered medications prior to visit.     Allergies:   Codeine phosphate, Nsaids, and Adhesive [tape]   Social History   Socioeconomic History  . Marital status: Married    Spouse name: Ahron Hulbert  . Number of children: 1  . Years of education: Not on file  . Highest education level: Not on file  Occupational History    . Occupation: PASTOR    Employer: [employer]  Tobacco Use  . Smoking status: Former Research scientist (life sciences)  . Smokeless tobacco: Never Used  . Tobacco comment: as a teenager  Vaping Use  . Vaping Use: Never used  Substance and Sexual Activity  . Alcohol use: No  . Drug use: No  . Sexual activity: Yes  Other Topics Concern  . Not on file  Social History Narrative   Patient gets regular exercise   Married x 42 years   Social Determinants of Health   Financial Resource Strain:   . Difficulty of Paying Living Expenses: Not on file  Food Insecurity:   . Worried About Charity fundraiser in the Last Year: Not on file  . Ran Out of Food in the Last Year: Not on file  Transportation Needs:   . Lack of Transportation (Medical): Not on file  . Lack of Transportation (Non-Medical): Not on file  Physical Activity:   . Days of Exercise per Week: Not on file  . Minutes of Exercise per Session: Not on file  Stress:   . Feeling of Stress : Not on file  Social Connections:   . Frequency of Communication with Friends and Family: Not on file  . Frequency of Social Gatherings with Friends and Family: Not on file  . Attends Religious Services: Not on file  . Active Member of Clubs or Organizations: Not on file  . Attends Archivist Meetings: Not on file  . Marital Status: Not on file     Family History:  The patient's family history includes Colon cancer in his cousin; Colon cancer (age of onset: 13) in his paternal uncle and paternal uncle; Colon cancer (age of onset: 68) in his father; Colon cancer (age of onset: 57) in his mother; Colon polyps in his  father and mother; Drug abuse in his sister; Heart disease in his mother; Hypertension in an other family member; Prostate cancer in his father; Stroke in his father.   ROS General: Negative; No fevers, chills, or night sweats;  HEENT: Negative; No changes in vision or hearing, sinus congestion, difficulty swallowing Pulmonary:  Negative; No cough, wheezing, shortness of breath, hemoptysis Cardiovascular: Negative; No chest pain, presyncope, syncope, palpitations GI: Negative; No nausea, vomiting, diarrhea, or abdominal pain GU: Negative; No dysuria, hematuria, or difficulty voiding Musculoskeletal: He is status post 2 back surgeries making walking difficult Hematologic/Oncology: Negative; no easy bruising, bleeding Endocrine: Negative; no heat/cold intolerance; no diabetes Neuro: Negative; no changes in balance, headaches Skin: Negative; No rashes or skin lesions Psychiatric: Negative; No behavioral problems, depression Sleep: Positive for OSA on CPAP therapy with set up date May 25, 2019, [facility] as DME company.  Admits to feeling well with marked improvement since CPAP initiation.  No breakthrough snoring.  No residual daytime sleepiness,  no bruxism, restless legs, hypnogognic hallucinations, no cataplexy Other comprehensive 14 point system review is negative.   PHYSICAL EXAM:   VS:  BP 136/82   Pulse 83   Ht '5\' 6"'  (1.676 m)   Wt 231 lb (104.8 kg)   BMI 37.28 kg/m     Repeat blood pressure by me was 130/80  Wt Readings from Last 3 Encounters:  09/12/20 231 lb (104.8 kg)  09/12/20 230 lb 12.8 oz (104.7 kg)  09/06/20 238 lb 12.8 oz (108.3 kg)    General: Alert, oriented, no distress.  Skin: normal turgor, no rashes, warm and dry HEENT: Normocephalic, atraumatic. Pupils equal round and reactive to light; sclera anicteric; extraocular muscles intact;  Nose without nasal septal hypertrophy Mouth/Parynx benign; Mallinpatti scale 3/4 Neck: No JVD, no carotid bruits; normal carotid upstroke Lungs: clear to ausculatation and percussion; no wheezing or rales Chest wall: without tenderness to palpitation Heart: PMI not displaced, RRR, s1 s2 normal, 1/6 systolic murmur, no diastolic murmur, no rubs, gallops, thrills, or heaves Abdomen: soft, nontender; no hepatosplenomehaly, BS+; abdominal aorta  nontender and not dilated by palpation. Back: no CVA tenderness Pulses 2+ Musculoskeletal: full range of motion, normal strength, no joint deformities Extremities: no clubbing cyanosis or edema, Homan's sign negative  Neurologic: grossly nonfocal; Cranial nerves grossly wnl Psychologic: Normal mood and affect   Studies/Labs Reviewed:   ECG (independently read by me): NSR at 83, normal intervals  January 13, 2019 ECG (independently read by me): Normal sinus rhythm at 84 bpm.  PR interval 202 ms.  No ectopy.  Recent Labs: BMP Latest Ref Rng & Units 08/14/2020 08/13/2020 08/12/2020  Glucose 70 - 99 mg/dL 100(H) 104(H) 129(H)  BUN 8 - 23 mg/dL 6(L) 11 11  Creatinine 0.61 - 1.24 mg/dL 1.05 1.15 1.25(H)  BUN/Creat Ratio 10 - 24 - - -  Sodium 135 - 145 mmol/L 143 143 145  Potassium 3.5 - 5.1 mmol/L 3.9 3.8 3.4(L)  Chloride 98 - 111 mmol/L 105 108 109  CO2 22 - 32 mmol/L '24 24 25  ' Calcium 8.9 - 10.3 mg/dL 9.2 8.8(L) 9.0     Hepatic Function Latest Ref Rng & Units 08/13/2020 08/12/2020 05/23/2020  Total Protein 6.5 - 8.1 g/dL 5.4(L) 5.8(L) 7.1  Albumin 3.5 - 5.0 g/dL 2.8(L) 3.0(L) -  AST 15 - 41 U/L '28 15 15  ' ALT 0 - 44 U/L '7 10 14  ' Alk Phosphatase 38 - 126 U/L 69 84 -  Total Bilirubin 0.3 -  1.2 mg/dL 0.9 0.6 0.5  Bilirubin, Direct 0.0 - 0.2 mg/dL - - 0.1    CBC Latest Ref Rng & Units 08/14/2020 08/13/2020 08/13/2020  WBC 4.0 - 10.5 K/uL 8.5 - 4.7  Hemoglobin 13.0 - 17.0 g/dL 9.6(L) 7.8(L) 6.5(LL)  Hematocrit 39 - 52 % 30.6(L) 24.5(L) 21.2(L)  Platelets 150 - 400 K/uL 411(H) - 338   Lab Results  Component Value Date   MCV 89.0 08/14/2020   MCV 88.3 08/13/2020   MCV 88.3 08/12/2020   Lab Results  Component Value Date   TSH 1.04 05/23/2020   Lab Results  Component Value Date   HGBA1C 5.5 05/23/2020     BNP    Component Value Date/Time   BNP 84.0 07/29/2020 1626    ProBNP No results found for: PROBNP   Lipid Panel     Component Value Date/Time   CHOL 196 11/20/2018  1532   TRIG 269.0 (H) 11/20/2018 1532   TRIG 231 (HH) 09/13/2006 0957   HDL 43.60 11/20/2018 1532   CHOLHDL 4 11/20/2018 1532   VLDL 53.8 (H) 11/20/2018 1532   LDLCALC 86 11/25/2017 1021   LDLDIRECT 109.0 11/20/2018 1532     RADIOLOGY: No results found.   Additional studies/ records that were reviewed today include:  I reviewed the patient's office evaluations, echo Doppler study, recent home sleep study, subsequent CPAP titration study, and obtained a download in the office today.  I reviewed the patient's recent hospitalization from early October 2021.  A new sleep Epworth sleepiness scale score was calculated today which can continue to show an Epworth scale score of 5 similar to 1 year ago.   ASSESSMENT:    1. OSA on CPAP   2. Essential hypertension   3. Paroxysmal atrial fibrillation (HCC)   4. Long term (current) use of anticoagulants   5. Symptomatic anemia   6. Hyperlipidemia, unspecified hyperlipidemia type     PLAN:  Mr. Evyn Frazier is a 74 year-old retired [employer]  who has a history of hypertension, hyperlipidemia, as well as paroxysmal atrial fibrillation.  Due to concerns for obstructive sleep apnea with significant snoring, waking up gasping for breath, frequent nocturia, he was referred for sleep study and was found to have severe obstructive sleep apnea (AHI of 50.9/h and oxygen nadir at 75%.)  His CPAP titration study was performed and at 13 cm water pressure AHI was 0 and O2 nadir 91%.  There was resolution of snoring.  Since initiating CPAP therapy he has noticed dramatic improvement in his sleep quality.  Most nights he is sleeping without waking up.  His nocturia has resolved.  He feels refreshed.  He has no residual daytime sleepiness with an Epworth scale endorsed at 5 in the office which was unchanged from 1 year previously.  I again reviewed his sleep study in detail and discussed with him at length the potential adverse consequences of  untreated sleep apnea on his cardiovascular health.  His current download continues to show excellent compliance with an AHI of 1.3 at his 13 cm set CPAP pressure.  There is no mask leak. His blood pressure today is 130/80 and he has continued to be on diltiazem 240 mg daily, metoprolol succinate 25 mg, spironolactone 25 mg, furosemide 60 mg daily, and telmisartan 80 mg.  He was recently hospitalized with anemia requiring blood cell transfusion.  No active bleeding source was identified.  He is maintaining sinus rhythm without recurrent atrial fibrillation and ECG today shows sinus rhythm with  ventricular rate at 83.  He is on Protonix 40 mg twice a day and is unaware of any black tarry stools.  He will be going to [place] in several weeks.  As long as he is stable I will see him in 1 year for follow-up sleep evaluation per Medicare guidelines.    Medication Adjustments/Labs and Tests Ordered: Current medicines are reviewed at length with the patient today.  Concerns regarding medicines are outlined above.  Medication changes, Labs and Tests ordered today are listed in the Patient Instructions below. Patient Instructions  Follow-Up: At [facility], you and your health needs are our priority.  As part of our continuing mission to provide you with exceptional heart care, we have created designated Provider Care Teams.  These Care Teams include your primary Cardiologist (physician) and Advanced Practice Providers (APPs -  Physician Assistants and Nurse Practitioners) who all work together to provide you with the care you need, when you need it.  We recommend signing up for the patient portal called "MyChart".  Sign up information is provided on this After Visit Summary.  MyChart is used to connect with patients for Virtual Visits (Telemedicine).  Patients are able to view lab/test results, encounter notes, upcoming appointments, etc.  Non-urgent messages can be sent to your provider as well.   To learn  more about what you can do with MyChart, go to [email or web address].    Your next appointment:   12 month(s)  (sleep clinic)  The format for your next appointment:   In Person  Provider:   Shelva Majestic, MD       Signed, Shelva Majestic, MD  09/18/2020 8:18 PM    [facility] [address] [place], [place], [place]  [address] Phone: [phone]

## 2020-09-12 NOTE — Assessment & Plan Note (Signed)
Oxycodone   Potential benefits of a long term opioids use as well as potential risks (i.e. addiction risk, apnea etc) and complications (i.e. Somnolence, constipation and others) were explained to the patient and were aknowledged.

## 2020-09-12 NOTE — Assessment & Plan Note (Signed)
On Eliquis

## 2020-09-12 NOTE — Patient Instructions (Addendum)
You can try Lion's Mane Mushroom extract or capsules for memory   

## 2020-09-18 ENCOUNTER — Encounter: Payer: Self-pay | Admitting: Cardiovascular Disease

## 2020-09-20 DIAGNOSIS — I10 Essential (primary) hypertension: Secondary | ICD-10-CM | POA: Diagnosis not present

## 2020-09-20 DIAGNOSIS — D509 Iron deficiency anemia, unspecified: Secondary | ICD-10-CM | POA: Diagnosis not present

## 2020-09-22 ENCOUNTER — Encounter: Payer: Self-pay | Admitting: *Deleted

## 2020-09-22 LAB — CBC
Hematocrit: 40.1 % (ref 37.5–51.0)
Hemoglobin: 13 g/dL (ref 13.0–17.7)
MCH: 28.7 pg (ref 26.6–33.0)
MCHC: 32.4 g/dL (ref 31.5–35.7)
MCV: 89 fL (ref 79–97)
Platelets: 317 10*3/uL (ref 150–450)
RBC: 4.53 x10E6/uL (ref 4.14–5.80)
RDW: 14.7 % (ref 11.6–15.4)
WBC: 7.8 10*3/uL (ref 3.4–10.8)

## 2020-09-22 LAB — BASIC METABOLIC PANEL
BUN/Creatinine Ratio: 12 (ref 10–24)
BUN: 14 mg/dL (ref 8–27)
CO2: 25 mmol/L (ref 20–29)
Calcium: 10 mg/dL (ref 8.6–10.2)
Chloride: 90 mmol/L — ABNORMAL LOW (ref 96–106)
Creatinine, Ser: 1.17 mg/dL (ref 0.76–1.27)
GFR calc Af Amer: 70 mL/min/{1.73_m2} (ref >59)
GFR calc non Af Amer: 61 mL/min/{1.73_m2} (ref >59)
Glucose: 90 mg/dL (ref 65–99)
Potassium: 4.4 mmol/L (ref 3.5–5.2)
Sodium: 142 mmol/L (ref 134–144)

## 2020-09-26 ENCOUNTER — Telehealth: Payer: Self-pay | Admitting: Cardiovascular Disease

## 2020-09-26 NOTE — Telephone Encounter (Signed)
Patient called and was saying his blood pressure has been running real low. Says it was 85/32. Please call to confirm about medication

## 2020-09-26 NOTE — Telephone Encounter (Signed)
Sounds like that dose of telmisartan is now too much for him, please change to telmisartan 40 mg daily. Also skip furosemide for 2 days and then start taking it only every other day. If morning BP is less than 110, do not take telmisartan at all. And send [place] BP again in about 3 days, please

## 2020-09-26 NOTE — Telephone Encounter (Signed)
Spoke with pt on the phone.  Pt reports his blood pressure log as follows:  Wed 11/10: AM: 82/53         PM: 104/65  Thurs 11/11: AM: 113/78           PM: 126/73  Sun 11/14: AM: 115/71         PM: 95/65  Mon 11/15: AM: 118/71  Reviewed pt's medication list with him.  Pt states that he no longer takes diltiazem and has been off this medication for a while.  Pt is taking lasix 40mg  once a day, metoprolol succinate 25mg  once a day, and spironolactone 25mg  once a day.  Pt states that on Wed. 11/10 he did take telmisartan 80mg  with resulting blood pressure reading as well as not feeling well that day, he describes feeling lackadaisical and "run down".  Asked pt about nutritional status, he feels like he is eating better lately and getting adequate fluids and this seems to be helping with his lower extremity edema. Will refer to Dr. Sallyanne Kuster.

## 2020-09-27 MED ORDER — TELMISARTAN 40 MG PO TABS
40.0000 mg | ORAL_TABLET | Freq: Every day | ORAL | 3 refills | Status: DC
Start: 2020-09-27 — End: 2020-12-14

## 2020-09-27 MED ORDER — FUROSEMIDE 40 MG PO TABS: 60.0000 mg | ORAL_TABLET | ORAL | 5 refills | Status: DC

## 2020-09-27 NOTE — Telephone Encounter (Signed)
Patient made aware of and verbalized understanding. He will keep a daily blood pressure log and call back.

## 2020-10-16 NOTE — Progress Notes (Signed)
Cardiology Clinic Note   Patient Name: Cody Frazier Date of Encounter: 10/18/2020  Primary Care Provider:  Cassandria Anger, MD Primary Cardiologist:  Sanda Klein, MD  Patient Profile    Cody Frazier 74 year old male presents the clinic today for follow-up evaluation of his hypertensive heart disease, paroxysmal atrial fibrillation, and hyperlipidemia.  Past Medical History    Past Medical History:  Diagnosis Date  . Allergic rhinitis   . Anemia, iron deficiency   . Arthritis   . Atrial arrhythmia   . Atrial fib/flutter, transient    following prior surgeries x 2  . Barrett's esophagus without dysplasia   . BPH (benign prostatic hyperplasia)   . Bright's disease   . Chronic LBP   . Closed fracture of right distal radius   . DDD (degenerative disc disease)   . Diverticulosis of colon   . Duodenal stricture   . Gastric outlet obstruction   . GERD (gastroesophageal reflux disease)   . Glaucoma   . Hemorrhoids   . History of kidney stones   . Hyperlipidemia   . Hyperplastic colonic polyp 01/2000  . Hypertension   . Nephrolithiasis    hx of B  . Peptic ulcer disease with hemorrhage 08/2008   and GOO H. Pylori Ab negative  . Renal cyst   . Sleep apnea with use of continuous positive airway pressure (CPAP)   . Tubular adenoma of colon 03/2012  . Wears glasses    Past Surgical History:  Procedure Laterality Date  . APPENDECTOMY    . BACK SURGERY  02/2003   L5  . BILROTH I PROCEDURE  2011   Dr Johney Maine  . CLOSED REDUCTION WRIST FRACTURE Right 01/06/2019   Procedure: Closed Reduction Wrist/Pinning Wrist;  Surgeon: Dayna Barker, MD;  Location: [place];  Service: Plastics;  Laterality: Right;  . COLONOSCOPY    . COLONOSCOPY WITH PROPOFOL N/A 08/14/2020   Procedure: COLONOSCOPY WITH PROPOFOL;  Surgeon: [name], MD;  Location[facility];  Service: Endoscopy;  Laterality: N/A;  . HIATAL HERNIA REPAIR    . LAPAROTOMY N/A 05/10/2017   Procedure:  EXPLORATORY LAPAROTOMY WITH ENTEROLYSIS;  Surgeon: Johnathan Hausen, MD;  Location: [facility];  Service: General;  Laterality: N/A;  . LUMBAR FUSION  2009  . SMALL INTESTINE SURGERY    . TONSILLECTOMY    . UPPER GASTROINTESTINAL ENDOSCOPY  07/26/2020    Allergies  Allergies  Allergen Reactions  . Codeine Phosphate Swelling  . Nsaids Other (See Comments)    ulcers  . Adhesive [Tape] Rash    ekg strips    History of Present Illness    Cody Frazier is a PMH of hypertensive heart disease without CHF, paroxysmal atrial fibrillation, aortic atherosclerosis, pulmonary hypertension, nonrheumatic aortic valve stenosis, essential hypertension, OSA, GERD, dyslipidemia, iron deficiency anemia, DOE, obesity, memory difficulties, Barrett's esophagus, gastric ulcers and anxiety.  He was diagnosed with severe anemia with a hemoglobin of 3.5.  He received transfusions and was discharged with a hemoglobin of 9.3.  The increase in hemoglobin after they have his symptoms of weakness and increased his stamina.  However, he still noted mild dyspnea.  He has been monitoring his blood pressures and reported blood pressures in the 160s over 80s-low 90s.  And endoscopic evaluation showed evidence of gastritis, but no source of bleeding.  A capsule enteroscopy was unsuccessful due to the camera pill never leaving the stomach.  He was noted to have some lower extremity edema but denied orthopnea  and PND.  He reported compliance with his CPAP and denied anginal symptoms.  He also denied palpitations, dizziness, syncope, claudication, and neurological sequela.  He did note some poor short-term memory.  He had a repeat EGD 9/15 for surveillance of Barrett's esophagus.  In addition to his segments of Barrett's esophagus there was evidence of gastritis and nonbleeding duodenal diverticulum and evidence of previous fundoplasty.  No active bleeding was identified.  He was last seen by Dr. Sallyanne Kuster on 09/06/2020.  His EKG showed  normal sinus rhythm with a QTC of 446 ms.  His blood pressure was elevated at 162/92 and his pulse was 83.  [name] 2-3.  He was noted to have some bilateral lower extremity edema with an 8-10 pound weight gain.  His furosemide was increased to 60 mg daily and Spironolactone was added.  His telmisartan was also increased to 80 mg daily.  He presents the clinic today for follow-up evaluation and states he feels well today.  His blood pressure continues to be somewhat labile on December 1 he noted his blood pressure was 172/73.  He took a dose of his 40 mg telmisartan which lowered his blood pressure to 97/70.  He reports that this blood pressure decrease made him feel dizzy/lightheaded.  His other blood pressures have been in the 110s over 60s-70s since that time.  He has been very strict with the salt in his diet.  He is somewhat limited in his physical activity due to chronic back pain.  He reports that he just traveled to [place] with his family and had a wonderful time.  He does not weigh himself daily but states that he monitors for lower extremity edema.  I will decrease his telmisartan to 20 mg and have instructed him to take it for a blood pressure at or greater than 702 systolic.  He may take a second dose if he has no response to the medication.  I will give him the salty 6 diet sheet, the [place] support stocking sheet, have him weigh himself daily and call the office with a weight increase of 3 pounds overnight or 5 pounds in 1 week.  We will also order a BMP today and have him follow-up in 3 months and as needed.  Today he denies chest pain, shortness of breath, lower extremity edema, fatigue, palpitations, melena, hematuria, hemoptysis, diaphoresis, weakness, presyncope, syncope, orthopnea, and PND.   Home Medications    Prior to Admission medications   Medication Sig Start Date End Date Taking? Authorizing Provider  acetaminophen (TYLENOL) 500 MG tablet Take 1,000 mg by mouth every 6  (six) hours as needed for mild pain or moderate pain.    [provider]  atorvastatin (LIPITOR) 20 MG tablet TAKE 1 TABLET BY MOUTH EVERY DAY 04/20/20   Plotnikov, Evie Lacks, MD  cetirizine (ZYRTEC) 10 MG tablet Take 10 mg by mouth daily.    [provider]  Cholecalciferol (VITAMIN D3) 1.25 MG (50000 UT) CAPS TAKE 1 CAPSULE BY MOUTH EVERY 30 (THIRTY) DAYS. 07/20/20   Plotnikov, Evie Lacks, MD  diltiazem (CARDIZEM CD) 240 MG 24 hr capsule TAKE 1 CAPSULE BY MOUTH EVERY DAY 11/25/19   Croitoru, Mihai, MD  ELIQUIS 2.5 MG TABS tablet TAKE 1 TABLET BY MOUTH TWICE A DAY 01/31/20   Plotnikov, Evie Lacks, MD  famotidine (PEPCID) 40 MG tablet Take 1 tablet (40 mg total) by mouth at bedtime. 12/15/19   Ladene Artist, MD  Ferrous Sulfate (IRON) 325 (65 Fe)  MG TABS Take 1 tablet (325 mg total) by mouth 2 (two) times daily with a meal. 09/06/20   Ladene Artist, MD  fluticasone (FLONASE) 50 MCG/ACT nasal spray SPRAY 2 SPRAYS INTO EACH NOSTRIL EVERY DAY 10/24/19   Plotnikov, Evie Lacks, MD  furosemide (LASIX) 40 MG tablet Take 1.5 tablets (60 mg total) by mouth every other day. 09/27/20 12/26/20  Croitoru, Mihai, MD  latanoprost (XALATAN) 0.005 % ophthalmic solution Place 1 drop into both eyes at bedtime. 03/16/17   [provider]  metoprolol succinate (TOPROL-XL) 25 MG 24 hr tablet TAKE 1 TABLET BY MOUTH EVERY DAY 07/20/20   Croitoru, Mihai, MD  oxyCODONE (ROXICODONE) 15 MG immediate release tablet Take 1 tablet (15 mg total) by mouth 4 (four) times daily. 09/12/20   Plotnikov, Evie Lacks, MD  pantoprazole (PROTONIX) 40 MG tablet Take 1 tablet (40 mg total) by mouth 2 (two) times daily. 12/15/19   Ladene Artist, MD  polyethylene glycol [facility] Floria Raveling) packet Take 17 g by mouth daily. 05/17/17   Dessa Phi, DO  PRESCRIPTION MEDICATION Inhale into the lungs at bedtime. CPAP    [provider]  spironolactone (ALDACTONE) 25 MG tablet Take 1 tablet (25 mg total) by mouth daily.  09/06/20 12/05/20  Croitoru, Mihai, MD  telmisartan (MICARDIS) 40 MG tablet Take 1 tablet (40 mg total) by mouth daily. 09/27/20   Croitoru, Mihai, MD  timolol (TIMOPTIC) 0.25 % ophthalmic solution Place 1 drop into both eyes 2 (two) times daily.  06/07/19   [provider]  triamcinolone cream (KENALOG) 0.5 % Apply 1 application topically 3 (three) times daily. 02/24/19   Plotnikov, Evie Lacks, MD    Family History    Family History  Problem Relation Age of Onset  . Colon polyps Mother   . Colon cancer Mother 52  . Heart disease Mother   . Colon polyps Father   . Colon cancer Father 22  . Prostate cancer Father   . Stroke Father   . Colon cancer Paternal Uncle 49  . Colon cancer Paternal Uncle 68  . Hypertension Other   . Colon cancer Cousin   . Drug abuse Sister        overdose  . Stomach cancer Neg Hx   . Esophageal cancer Neg Hx   . Pancreatic cancer Neg Hx   . Liver disease Neg Hx    He indicated that his mother is deceased. He indicated that his father is deceased. He indicated that his sister is deceased. He indicated that his maternal grandmother is deceased. He indicated that his maternal grandfather is deceased. He indicated that his paternal grandmother is deceased. He indicated that his paternal grandfather is deceased. He indicated that his son is alive. He indicated that both of his paternal uncles are deceased. He indicated that the status of his cousin is unknown. He indicated that the status of his neg hx is unknown. He indicated that the status of his other is unknown.  Social History    Social History   Socioeconomic History  . Marital status: Married    Spouse name: Foster Frericks  . Number of children: 1  . Years of education: Not on file  . Highest education level: Not on file  Occupational History  . Occupation: PASTOR    Employer: [employer]  Tobacco Use  . Smoking status: Former Research scientist (life sciences)  . Smokeless tobacco: Never Used  . Tobacco  comment: as a teenager  Vaping Use  .  Vaping Use: Never used  Substance and Sexual Activity  . Alcohol use: No  . Drug use: No  . Sexual activity: Yes  Other Topics Concern  . Not on file  Social History Narrative   Patient gets regular exercise   Married x 42 years   Social Determinants of Health   Financial Resource Strain:   . Difficulty of Paying Living Expenses: Not on file  Food Insecurity:   . Worried About Charity fundraiser in the Last Year: Not on file  . Ran Out of Food in the Last Year: Not on file  Transportation Needs:   . Lack of Transportation (Medical): Not on file  . Lack of Transportation (Non-Medical): Not on file  Physical Activity:   . Days of Exercise per Week: Not on file  . Minutes of Exercise per Session: Not on file  Stress:   . Feeling of Stress : Not on file  Social Connections:   . Frequency of Communication with Friends and Family: Not on file  . Frequency of Social Gatherings with Friends and Family: Not on file  . Attends Religious Services: Not on file  . Active Member of Clubs or Organizations: Not on file  . Attends Archivist Meetings: Not on file  . Marital Status: Not on file  Intimate Partner Violence:   . Fear of Current or Ex-Partner: Not on file  . Emotionally Abused: Not on file  . Physically Abused: Not on file  . Sexually Abused: Not on file     Review of Systems    General:  No chills, fever, night sweats or weight changes.  Cardiovascular:  No chest pain, dyspnea on exertion, edema, orthopnea, palpitations, paroxysmal nocturnal dyspnea. Dermatological: No rash, lesions/masses Respiratory: No cough, dyspnea Urologic: No hematuria, dysuria Abdominal:   No nausea, vomiting, diarrhea, bright red blood per rectum, melena, or hematemesis Neurologic:  No visual changes, wkns, changes in mental status. All other systems reviewed and are otherwise negative except as noted above.  Physical Exam    VS:  BP 112/70  (BP Location: Left Arm, Patient Position: Sitting, Cuff Size: Large)   Pulse 86   Wt 228 lb 6.4 oz (103.6 kg)   SpO2 96%   BMI 36.86 kg/m  , BMI Body mass index is 36.86 kg/m. GEN: Well nourished, well developed, in no acute distress. HEENT: normal. Neck: Supple, no JVD, carotid bruits, or masses. Cardiac: RRR, no murmurs, rubs, or gallops. No clubbing, cyanosis, edema.  Radials/DP/PT 2+ and equal bilaterally.  Respiratory:  Respirations regular and unlabored, clear to auscultation bilaterally. GI: Soft, nontender, nondistended, BS + x 4. MS: no deformity or atrophy. Skin: warm and dry, no rash. Neuro:  Strength and sensation are intact. Psych: Normal affect.  Accessory Clinical Findings    Recent Labs: 05/23/2020: TSH 1.04 07/29/2020: BNP 84.0 08/13/2020: ALT 7 08/14/2020: Magnesium 2.2 09/20/2020: BUN 14; Creatinine, Ser 1.17; Hemoglobin 13.0; Platelets 317; Potassium 4.4; Sodium 142   Recent Lipid Panel    Component Value Date/Time   CHOL 196 11/20/2018 1532   TRIG 269.0 (H) 11/20/2018 1532   TRIG 231 (HH) 09/13/2006 0957   HDL 43.60 11/20/2018 1532   CHOLHDL 4 11/20/2018 1532   VLDL 53.8 (H) 11/20/2018 1532   LDLCALC 86 11/25/2017 1021   LDLDIRECT 109.0 11/20/2018 1532    ECG personally reviewed by me today-none today.  Echocardiogram 08/03/2020  IMPRESSIONS    1. Left ventricular ejection fraction, by estimation, is 60 to 65%.  The  left ventricle has normal function. The left ventricle has no regional  wall motion abnormalities. Left ventricular diastolic parameters are  consistent with Grade I diastolic  dysfunction (impaired relaxation).  2. Right ventricular systolic function is normal. The right ventricular  size is normal. There is mildly elevated pulmonary artery systolic  pressure. The estimated right ventricular systolic pressure is 48.1 mmHg.  3. The mitral valve is normal in structure. Trivial mitral valve  regurgitation. No evidence of mitral  stenosis.  4. The aortic valve is tricuspid. Aortic valve regurgitation is not  visualized. Mild aortic valve stenosis. Aortic valve area, by VTI measures  1.54 cm. Aortic valve mean gradient measures 12.0 mmHg.  5. The inferior vena cava is normal in size with greater than 50%  respiratory variability, suggesting right atrial pressure of 3 mmHg.  Assessment & Plan   1.   Atrial fibrillation-heart rate today 86 bpm.  No recent events with increased heart rate or palpitations.  Previous EKG showed sinus rhythm.  CHA2DS2-VASc score 2-3 Continue apixaban, diltiazem Heart healthy low-sodium diet-salty 6 given Increase physical activity as tolerated  Chronic diastolic CHF-no increased activity intolerance or DOE.  Echocardiogram 08/03/2020 showed an LVEF of 60 to 65%, G1 DD, mildly elevated pulmonary artery systolic pressure at 85.9 mmHg, trivial mitral valve regurgitation, and mild aortic valve stenosis. Continue furosemide, spironolactone, metoprolol, telmisartan, diltiazem Heart healthy low-sodium diet Daily weights-contact office with a weight increase of 3 pounds overnight or 5 pounds in 1 week Elevate lower extremities when not active  Essential hypertension-BP today 112/70.  Well-controlled at home. Continue diltiazem, furosemide, metoprolol, spironolactone, telmisartan. Heart healthy low-sodium diet-salty 6 given Increase physical activity as tolerated  Obstructive sleep apnea-reports compliance with CPAP.  Explains he rest much better with CPAP use. Continue CPAP use  Aortic stenosis-no increased DOE or activity intolerance.  Echocardiogram showed mild aortic stenosis. Continue to monitor  Disposition: Follow-up with Dr. Sallyanne Kuster in 3 months.  Jossie Ng. Keyante Durio NP-C    10/18/2020, 4:23 PM [address] Office [phone] Fax [phone]  Notice: This dictation was prepared with Dragon dictation along with smaller phrase  technology. Any transcriptional errors that result from this process are unintentional and may not be corrected upon review.

## 2020-10-18 ENCOUNTER — Other Ambulatory Visit: Payer: Self-pay

## 2020-10-18 ENCOUNTER — Encounter: Payer: Self-pay | Admitting: General Practice

## 2020-10-18 ENCOUNTER — Ambulatory Visit (INDEPENDENT_AMBULATORY_CARE_PROVIDER_SITE_OTHER): Payer: Medicare Other | Admitting: General Practice

## 2020-10-18 VITALS — BP 112/70 | HR 86 | Wt 228.4 lb

## 2020-10-18 DIAGNOSIS — I350 Nonrheumatic aortic (valve) stenosis: Secondary | ICD-10-CM

## 2020-10-18 DIAGNOSIS — G4733 Obstructive sleep apnea (adult) (pediatric): Secondary | ICD-10-CM

## 2020-10-18 DIAGNOSIS — Z9989 Dependence on other enabling machines and devices: Secondary | ICD-10-CM | POA: Diagnosis not present

## 2020-10-18 DIAGNOSIS — I10 Essential (primary) hypertension: Secondary | ICD-10-CM | POA: Diagnosis not present

## 2020-10-18 DIAGNOSIS — I480 Paroxysmal atrial fibrillation: Secondary | ICD-10-CM | POA: Diagnosis not present

## 2020-10-18 DIAGNOSIS — Z79899 Other long term (current) drug therapy: Secondary | ICD-10-CM

## 2020-10-18 DIAGNOSIS — I5032 Chronic diastolic (congestive) heart failure: Secondary | ICD-10-CM

## 2020-10-18 NOTE — Progress Notes (Signed)
Sounds great. What is the [place] support stocking sheet?

## 2020-10-18 NOTE — Patient Instructions (Signed)
Medication Instructions:  TAKE TELMISARTAN 20MG  IF BLOOD PRESSURE >140 IF BP DOES NOT GO DOWN YOU MAY TAKE 2ND DOSE IF BP DOES NOT GO DOWN <140 IN 4 HOURS  *If you need a refill on your cardiac medications before your next appointment, please call your pharmacy*  Lab Work:   Testing/Procedures:  BMET TODAY   NONE  Special Instructions TAKE AND ,LOG YOUR WEIGHT AND BLOOD PRESSURE DAILY  PLEASE READ AND FOLLOW SALTY 6-ATTACHED-1,800mg  daily  PLEASE INCREASE PHYSICAL ACTIVITY AS TOLERATED  PLEASE MAKE SURE TO ELEVATE YOUR FEET & LEGS WHILE SITTING, THIS WILL HELP WITH THE SWELLING ALSO.  PLEASE PURCHASE AND WEAR COMPRESSION STOCKINGS DAILY AND OFF AT BEDTIME. Compression stockings are elastic socks that squeeze the legs. They help to increase blood flow to the legs and to decrease swelling in the legs from fluid retention, and reduce the chance of developing blood clots in the lower legs. Please put on in the AM when dressing and off at night when dressing for bed.  [employer]  [place] ([address] [phone]); [place], [place] [address]; [phone]  EMAIL   [email or web address]  Follow-Up: Your next appointment:  3 month(s) In Person with Sanda Klein, MD OR IF UNAVAILABLE Callen CLEAVER, FNP-C   At [facility], you and your health needs are our priority.  As part of our continuing mission to provide you with exceptional heart care, we have created designated Provider Care Teams.  These Care Teams include your primary Cardiologist (physician) and Advanced Practice Providers (APPs -  Physician Assistants and Nurse Practitioners) who all work together to provide you with the care you need, when you need it.            6 SALTY THINGS TO AVOID     1,800MG  DAILY

## 2020-10-19 ENCOUNTER — Other Ambulatory Visit: Payer: Self-pay

## 2020-10-19 DIAGNOSIS — I5032 Chronic diastolic (congestive) heart failure: Secondary | ICD-10-CM

## 2020-10-19 DIAGNOSIS — Z79899 Other long term (current) drug therapy: Secondary | ICD-10-CM

## 2020-10-19 LAB — BASIC METABOLIC PANEL
BUN/Creatinine Ratio: 18 (ref 10–24)
BUN: 26 mg/dL (ref 8–27)
CO2: 22 mmol/L (ref 20–29)
Calcium: 9.8 mg/dL (ref 8.6–10.2)
Chloride: 101 mmol/L (ref 96–106)
Creatinine, Ser: 1.44 mg/dL — ABNORMAL HIGH (ref 0.76–1.27)
GFR calc Af Amer: 55 mL/min/{1.73_m2} — ABNORMAL LOW (ref >59)
GFR calc non Af Amer: 47 mL/min/{1.73_m2} — ABNORMAL LOW (ref >59)
Glucose: 104 mg/dL — ABNORMAL HIGH (ref 65–99)
Potassium: 4.8 mmol/L (ref 3.5–5.2)
Sodium: 139 mmol/L (ref 134–144)

## 2020-10-19 NOTE — Progress Notes (Signed)
Nice tip! Thanks

## 2020-10-24 ENCOUNTER — Telehealth: Payer: Self-pay | Admitting: Internal Medicine

## 2020-10-24 NOTE — Telephone Encounter (Signed)
   1.Medication Requested:  oxyCODONE (ROXICODONE) 15 MG immediate release tablet Cholecalciferol (VITAMIN D3) 1.25 MG (50000 UT) CAPS  2. Pharmacy (Name, Street, City):[id number] - [place], [place] - [address]  3. On Med List: YES 4. Last Visit with PCP: 09/12/20  5. Next visit date with PCP: 12/13/20   Agent: Please be advised that RX refills may take up to 3 business days. We ask that you follow-up with your pharmacy.

## 2020-10-25 MED ORDER — OXYCODONE HCL 15 MG PO TABS
15.0000 mg | ORAL_TABLET | Freq: Four times a day (QID) | ORAL | 0 refills | Status: DC
Start: 2020-10-25 — End: 2020-10-27

## 2020-10-25 NOTE — Telephone Encounter (Signed)
Done. Thx.

## 2020-10-27 ENCOUNTER — Other Ambulatory Visit: Payer: Self-pay

## 2020-10-27 ENCOUNTER — Ambulatory Visit (INDEPENDENT_AMBULATORY_CARE_PROVIDER_SITE_OTHER): Payer: Medicare Other | Admitting: Internal Medicine

## 2020-10-27 ENCOUNTER — Encounter: Payer: Self-pay | Admitting: Internal Medicine

## 2020-10-27 DIAGNOSIS — L570 Actinic keratosis: Secondary | ICD-10-CM | POA: Diagnosis not present

## 2020-10-27 DIAGNOSIS — E785 Hyperlipidemia, unspecified: Secondary | ICD-10-CM

## 2020-10-27 DIAGNOSIS — E559 Vitamin D deficiency, unspecified: Secondary | ICD-10-CM | POA: Diagnosis not present

## 2020-10-27 DIAGNOSIS — I480 Paroxysmal atrial fibrillation: Secondary | ICD-10-CM

## 2020-10-27 DIAGNOSIS — R413 Other amnesia: Secondary | ICD-10-CM | POA: Diagnosis not present

## 2020-10-27 DIAGNOSIS — Z6835 Body mass index (BMI) 35.0-35.9, adult: Secondary | ICD-10-CM | POA: Diagnosis not present

## 2020-10-27 DIAGNOSIS — H918X3 Other specified hearing loss, bilateral: Secondary | ICD-10-CM | POA: Diagnosis not present

## 2020-10-27 DIAGNOSIS — M519 Unspecified thoracic, thoracolumbar and lumbosacral intervertebral disc disorder: Secondary | ICD-10-CM | POA: Diagnosis not present

## 2020-10-27 MED ORDER — OXYCODONE HCL 15 MG PO TABS
15.0000 mg | ORAL_TABLET | Freq: Four times a day (QID) | ORAL | 0 refills | Status: DC
Start: 2020-10-27 — End: 2021-01-10

## 2020-10-27 MED ORDER — VITAMIN D3 1.25 MG (50000 UT) PO CAPS
1.0000 | ORAL_CAPSULE | ORAL | 3 refills | Status: DC
Start: 2020-10-27 — End: 2021-07-13

## 2020-10-27 NOTE — Patient Instructions (Signed)
Sign up for [employer] ( via [employer] on your phone or your ipad). If you don't have a Art therapist card  - go to [employer] branch. They will set you up in 15 minutes. It is free. You can check out books to read and to listen, check out magazines and newspapers  [place] dove +3 more books

## 2020-10-27 NOTE — Assessment & Plan Note (Addendum)
Discussed weight loss. She started to lose weight. He needs to continue to lose weight on diet.

## 2020-10-27 NOTE — Assessment & Plan Note (Signed)
Hearing test 

## 2020-10-27 NOTE — Assessment & Plan Note (Addendum)
Better on [place] Mushroom capsules

## 2020-10-27 NOTE — Assessment & Plan Note (Signed)
Cryo  

## 2020-10-27 NOTE — Assessment & Plan Note (Addendum)
Stable. Continue with Eliquis

## 2020-10-27 NOTE — Assessment & Plan Note (Addendum)
Continue with Vit D 50,000 units monthly ?

## 2020-10-27 NOTE — Assessment & Plan Note (Addendum)
-   Continue with Lipitor 20mg daily

## 2020-10-27 NOTE — Progress Notes (Signed)
Subjective:  Patient ID: Cody Frazier, male    DOB: 06/15/1946  Age: 74 y.o. MRN: [id number]  CC: Nevus (Want MD to check on a mole on his face)   HPI Cody Frazier presents for LBP, obesity, memory problems C/o painful skin lesion on the left temple.  Outpatient Medications Prior to Visit  Medication Sig Dispense Refill  . acetaminophen (TYLENOL) 500 MG tablet Take 1,000 mg by mouth every 6 (six) hours as needed for mild pain or moderate pain.    Marland Kitchen atorvastatin (LIPITOR) 20 MG tablet TAKE 1 TABLET BY MOUTH EVERY DAY 90 tablet 3  . cetirizine (ZYRTEC) 10 MG tablet Take 10 mg by mouth daily.    . Cholecalciferol (VITAMIN D3) 1.25 MG (50000 UT) CAPS TAKE 1 CAPSULE BY MOUTH EVERY 30 (THIRTY) DAYS. 3 capsule 3  . ELIQUIS 2.5 MG TABS tablet TAKE 1 TABLET BY MOUTH TWICE A DAY 60 tablet 11  . famotidine (PEPCID) 40 MG tablet Take 1 tablet (40 mg total) by mouth at bedtime. 30 tablet 11  . Ferrous Sulfate (IRON) 325 (65 Fe) MG TABS Take 1 tablet (325 mg total) by mouth 2 (two) times daily with a meal. 180 tablet 0  . fluticasone (FLONASE) 50 MCG/ACT nasal spray SPRAY 2 SPRAYS INTO EACH NOSTRIL EVERY DAY 48 mL 5  . furosemide (LASIX) 40 MG tablet Take 1.5 tablets (60 mg total) by mouth every other day. 24 tablet 5  . latanoprost (XALATAN) 0.005 % ophthalmic solution Place 1 drop into both eyes at bedtime.  11  . metoprolol succinate (TOPROL-XL) 25 MG 24 hr tablet TAKE 1 TABLET BY MOUTH EVERY DAY 90 tablet 2  . oxyCODONE (ROXICODONE) 15 MG immediate release tablet Take 1 tablet (15 mg total) by mouth 4 (four) times daily. 120 tablet 0  . pantoprazole (PROTONIX) 40 MG tablet Take 1 tablet (40 mg total) by mouth 2 (two) times daily. 60 tablet 11  . polyethylene glycol (MIRALAX / GLYCOLAX) packet Take 17 g by mouth daily. 14 each 0  . PRESCRIPTION MEDICATION Inhale into the lungs at bedtime. CPAP    . spironolactone (ALDACTONE) 25 MG tablet Take 1 tablet (25 mg total) by mouth daily. 90 tablet  1  . telmisartan (MICARDIS) 40 MG tablet Take 1 tablet (40 mg total) by mouth daily. 30 tablet 3  . timolol (TIMOPTIC) 0.25 % ophthalmic solution Place 1 drop into both eyes 2 (two) times daily.     Marland Kitchen triamcinolone cream (KENALOG) 0.5 % Apply 1 application topically 3 (three) times daily. 90 g 1   No facility-administered medications prior to visit.    ROS: Review of Systems  Constitutional: Positive for fatigue and unexpected weight change. Negative for appetite change.  HENT: Negative for congestion, nosebleeds, sneezing, sore throat and trouble swallowing.   Eyes: Negative for itching and visual disturbance.  Respiratory: Negative for cough.   Cardiovascular: Negative for chest pain, palpitations and leg swelling.  Gastrointestinal: Negative for abdominal distention, blood in stool, diarrhea and nausea.  Genitourinary: Negative for frequency and hematuria.  Musculoskeletal: Positive for arthralgias, back pain and gait problem. Negative for joint swelling and neck pain.  Skin: Negative for rash.  Neurological: Negative for dizziness, tremors, speech difficulty and weakness.  Psychiatric/Behavioral: Negative for agitation, dysphoric mood, sleep disturbance and suicidal ideas. The patient is nervous/anxious.     Objective:  BP 138/80 (BP Location: Left Arm)   Pulse 76   Temp 98.6 F (37 C) (Oral)  Wt 230 lb 3.2 oz (104.4 kg)   SpO2 97%   BMI 37.16 kg/m   BP Readings from Last 3 Encounters:  10/27/20 138/80  10/18/20 112/70  09/12/20 136/82    Wt Readings from Last 3 Encounters:  10/27/20 230 lb 3.2 oz (104.4 kg)  10/18/20 228 lb 6.4 oz (103.6 kg)  09/12/20 231 lb (104.8 kg)    Physical Exam Constitutional:      General: He is not in acute distress.    Appearance: He is well-developed. He is obese.     Comments: NAD  HENT:     Mouth/Throat:     Mouth: Oropharynx is clear and moist.  Eyes:     Conjunctiva/sclera: Conjunctivae normal.     Pupils: Pupils are equal,  round, and reactive to light.  Neck:     Thyroid: No thyromegaly.     Vascular: No JVD.  Cardiovascular:     Rate and Rhythm: Normal rate and regular rhythm.     Pulses: Intact distal pulses.     Heart sounds: Normal heart sounds. No murmur heard. No friction rub. No gallop.   Pulmonary:     Effort: Pulmonary effort is normal. No respiratory distress.     Breath sounds: Normal breath sounds. No wheezing or rales.  Chest:     Chest wall: No tenderness.  Abdominal:     General: Bowel sounds are normal. There is no distension.     Palpations: Abdomen is soft. There is no mass.     Tenderness: There is no abdominal tenderness. There is no guarding or rebound.  Musculoskeletal:        General: Tenderness present. No edema. Normal range of motion.     Cervical back: Normal range of motion.  Lymphadenopathy:     Cervical: No cervical adenopathy.  Skin:    General: Skin is warm and dry.     Findings: No rash.  Neurological:     Mental Status: He is alert and oriented to person, place, and time.     Cranial Nerves: No cranial nerve deficit.     Motor: No abnormal muscle tone.     Coordination: He displays a negative Romberg sign. Coordination abnormal.     Gait: Gait normal.     Deep Tendon Reflexes: Reflexes are normal and symmetric.  Psychiatric:        Mood and Affect: Mood and affect normal.        Behavior: Behavior normal.        Thought Content: Thought content normal.        Judgment: Judgment normal.   cane LS w/pain  L temple - irritated SK/AK   Procedure Note :     Procedure : Cryosurgery   Indication:  Actinic keratosis(es)   Risks including unsuccessful procedure , bleeding, infection, bruising, scar, a need for a repeat  procedure and others were explained to the patient in detail as well as the benefits. Informed consent was obtained verbally.    1 lesion(s)  On L temple    was/were treated with liquid nitrogen on a Q-tip in a usual fasion . Band-Aid was  applied and antibiotic ointment was given for a later use.   Tolerated well. Complications none.   Postprocedure instructions :     Keep the wounds clean. You can wash them with liquid soap and water. Pat dry with gauze or a Kleenex tissue  Before applying antibiotic ointment and a Band-Aid.   You need to  report immediately  if  any signs of infection develop.      Lab Results  Component Value Date   WBC 7.8 09/20/2020   HGB 13.0 09/20/2020   HCT 40.1 09/20/2020   PLT 317 09/20/2020   GLUCOSE 104 (H) 10/18/2020   CHOL 196 11/20/2018   TRIG 269.0 (H) 11/20/2018   HDL 43.60 11/20/2018   LDLDIRECT 109.0 11/20/2018   LDLCALC 86 11/25/2017   ALT 7 08/13/2020   AST 28 08/13/2020   NA 139 10/18/2020   K 4.8 10/18/2020   CL 101 10/18/2020   CREATININE 1.44 (H) 10/18/2020   BUN 26 10/18/2020   CO2 22 10/18/2020   TSH 1.04 05/23/2020   PSA 0.16 11/20/2018   INR 1.1 08/12/2020   HGBA1C 5.5 05/23/2020    DG Chest 2 View  Result Date: 08/13/2020 CLINICAL DATA:  Iron deficiency, anemia. EXAM: CHEST - 2 VIEW COMPARISON:  05/19/2018 FINDINGS: Low lung volumes. Mild diffuse interstitial prominence. No confluent airspace disease. Heart size upper limits normal for technique. Aortic Atherosclerosis (ICD10-170.0). Blunting of posterior costophrenic angles suggesting small effusions. No pneumothorax. Spondylitic changes near the thoracolumbar junction. IMPRESSION: Low volumes with small pleural effusions. Electronically Signed   By: Lucrezia Europe M.D.   On: 08/13/2020 14:14   CT ABDOMEN PELVIS W CONTRAST  Result Date: 08/13/2020 CLINICAL DATA:  Anemia.  History of gastric ulcers. EXAM: CT ABDOMEN AND PELVIS WITH CONTRAST TECHNIQUE: Multidetector CT imaging of the abdomen and pelvis was performed using the standard protocol following bolus administration of intravenous contrast. CONTRAST:  154mL OMNIPAQUE IOHEXOL 300 MG/ML  SOLN COMPARISON:  05/10/2017 FINDINGS: Lower chest: Coronary  calcifications. New small pleural effusions, right greater than left. Dependent atelectasis in the posterior right lower lobe. Hepatobiliary: No focal liver abnormality is seen. Gallbladder unremarkable. CBD is mildly distended up to 12 mm diameter, seen down to the ampulla without radiodense calculus. Portal vein patent. Pancreas: Diffuse pancreatic atrophy without ductal dilatation. Spleen: Normal in size without focal abnormality. Adrenals/Urinary Tract: Adrenal glands unremarkable. Bilateral renal cysts, stable since previous. No hydronephrosis. Urinary bladder physiologically distended. Stomach/Bowel: Postop changes of [place] 1. Stomach and small bowel are nondilated. Appendix surgically absent. The colon is nondilated, with scattered descending and sigmoid diverticula; no adjacent inflammatory change. Vascular/Lymphatic: Aortoiliac calcified atheromatous plaque without aneurysm or stenosis. Circumaortic left renal vein, an anatomic variant. Portal vein patent. No abdominal or pelvic adenopathy. Reproductive: Prostate is unremarkable. Other: Bilateral pelvic phleboliths.  No ascites.  No free air. Musculoskeletal: Thoracolumbar dextroscoliosis apex L2 multilevel thoracolumbar spondylitic change. Previous instrumented spinal fusion L1-S1. No fracture or worrisome bone lesion. IMPRESSION: 1. No acute findings. 2. New small bilateral pleural effusions, right greater than left. 3. Coronary and  Aortic Atherosclerosis (ICD10-I70.0). 4. Descending and sigmoid diverticulosis. Electronically Signed   By: Lucrezia Europe M.D.   On: 08/13/2020 14:12    Assessment & Plan:   There are no diagnoses linked to this encounter.   No orders of the defined types were placed in this encounter.    Follow-up: No follow-ups on file.  Walker Kehr, MD

## 2020-10-30 NOTE — Assessment & Plan Note (Signed)
Chronic Continue with oxycodone 15 mg 4 times a day as needed  Potential benefits of a long term opioids use as well as potential risks (i.e. addiction risk, apnea etc) and complications (i.e. Somnolence, constipation and others) were explained to the patient and were aknowledged. 

## 2020-11-15 ENCOUNTER — Telehealth: Payer: Self-pay | Admitting: Internal Medicine

## 2020-11-15 NOTE — Telephone Encounter (Signed)
Notified pt Per ov MD states " [place] Mushroom capsules "...Raechel Chute

## 2020-11-15 NOTE — Telephone Encounter (Signed)
Patient states during his last visit Dr. Posey Rea told him to start taking an OTC medication for his memory and he can't remember the name of it. He thinks it is something related to mushrooms. Would like someone to call him and let him know what the medication is. [phone]

## 2020-11-18 ENCOUNTER — Other Ambulatory Visit: Payer: Self-pay | Admitting: Internal Medicine

## 2020-12-13 ENCOUNTER — Ambulatory Visit: Payer: Medicare Other | Admitting: Internal Medicine

## 2020-12-14 ENCOUNTER — Encounter: Payer: Self-pay | Admitting: Cardiovascular Disease

## 2020-12-14 ENCOUNTER — Other Ambulatory Visit: Payer: Self-pay

## 2020-12-14 ENCOUNTER — Ambulatory Visit (INDEPENDENT_AMBULATORY_CARE_PROVIDER_SITE_OTHER): Payer: Medicare Other | Admitting: Cardiovascular Disease

## 2020-12-14 VITALS — BP 140/90 | HR 84 | Ht 65.0 in | Wt 227.0 lb

## 2020-12-14 DIAGNOSIS — Z7901 Long term (current) use of anticoagulants: Secondary | ICD-10-CM

## 2020-12-14 DIAGNOSIS — I350 Nonrheumatic aortic (valve) stenosis: Secondary | ICD-10-CM

## 2020-12-14 DIAGNOSIS — I5032 Chronic diastolic (congestive) heart failure: Secondary | ICD-10-CM

## 2020-12-14 DIAGNOSIS — I480 Paroxysmal atrial fibrillation: Secondary | ICD-10-CM

## 2020-12-14 DIAGNOSIS — I10 Essential (primary) hypertension: Secondary | ICD-10-CM | POA: Diagnosis not present

## 2020-12-14 DIAGNOSIS — D500 Iron deficiency anemia secondary to blood loss (chronic): Secondary | ICD-10-CM | POA: Diagnosis not present

## 2020-12-14 DIAGNOSIS — I700 Atherosclerosis of aorta: Secondary | ICD-10-CM

## 2020-12-14 DIAGNOSIS — G4733 Obstructive sleep apnea (adult) (pediatric): Secondary | ICD-10-CM

## 2020-12-14 DIAGNOSIS — I2721 Secondary pulmonary arterial hypertension: Secondary | ICD-10-CM | POA: Diagnosis not present

## 2020-12-14 MED ORDER — FUROSEMIDE 40 MG PO TABS
40.0000 mg | ORAL_TABLET | Freq: Every day | ORAL | 2 refills | Status: DC | PRN
Start: 2020-12-14 — End: 2021-10-16

## 2020-12-14 MED ORDER — TELMISARTAN 20 MG PO TABS
20.0000 mg | ORAL_TABLET | Freq: Every day | ORAL | 3 refills | Status: DC
Start: 2020-12-14 — End: 2021-12-14
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
## 2020-12-14 NOTE — Patient Instructions (Signed)
Medication Instructions:  TAKE the Furosemide as needed DECREASE the Telmisartan to 20 mg once daily STOP the Ferrous Sulfate *If you need a refill on your cardiac medications before your next appointment, please call your pharmacy*   Lab Work: Your provider would like for you to return in 2 months to have the following labs drawn: CBC and BMET. You do not need an appointment for the lab. Once in our office lobby there is a podium where you can sign in and ring the doorbell to alert [place] that you are here. The lab is open from 8:00 am to 4:30 pm; closed for lunch from 12:45pm-1:45pm.  If you have labs (blood work) drawn today and your tests are completely normal, you will receive your results only by: Marland Kitchen MyChart Message (if you have MyChart) OR . A paper copy in the mail If you have any lab test that is abnormal or we need to change your treatment, we will call you to review the results.   Testing/Procedures: None ordered   Follow-Up: At [facility], you and your health needs are our priority.  As part of our continuing mission to provide you with exceptional heart care, we have created designated Provider Care Teams.  These Care Teams include your primary Cardiologist (physician) and Advanced Practice Providers (APPs -  Physician Assistants and Nurse Practitioners) who all work together to provide you with the care you need, when you need it.  We recommend signing up for the patient portal called "MyChart".  Sign up information is provided on this After Visit Summary.  MyChart is used to connect with patients for Virtual Visits (Telemedicine).  Patients are able to view lab/test results, encounter notes, upcoming appointments, etc.  Non-urgent messages can be sent to your provider as well.   To learn more about what you can do with MyChart, go to [email or web address].    Your next appointment:   12 month(s)  The format for your next appointment:   In Person  Provider:   You may  see Sanda Klein, MD or one of the following Advanced Practice Providers on your designated Care Team:    Almyra Deforest, PA-C  Fabian Sharp, PA-C or   Roby Lofts, [place]

## 2020-12-18 ENCOUNTER — Other Ambulatory Visit: Payer: Self-pay | Admitting: Internal Medicine

## 2020-12-21 ENCOUNTER — Other Ambulatory Visit: Payer: Self-pay

## 2020-12-21 ENCOUNTER — Ambulatory Visit (INDEPENDENT_AMBULATORY_CARE_PROVIDER_SITE_OTHER): Payer: Medicare Other

## 2020-12-21 VITALS — BP 220/120 | HR 69 | Temp 98.4°F | Ht 65.0 in | Wt 232.0 lb

## 2020-12-21 DIAGNOSIS — Z0000 Encounter for general adult medical examination without abnormal findings: Secondary | ICD-10-CM

## 2020-12-21 NOTE — Progress Notes (Signed)
Subjective:   Cody Frazier is a 75 y.o. male who presents for Medicare Annual/Subsequent preventive examination.  Review of Systems    No ROS. Medicare Wellness Visit. Additional risk factors are reflected in social history. Cardiac Risk Factors include: advanced age (>1men, >59 women);dyslipidemia;hypertension;family history of premature cardiovascular disease;male gender;obesity (BMI >30kg/m2)     Objective:    Today's Vitals   12/21/20 1036 12/21/20 1053  BP: (!) 150/100 (!) 220/120  Pulse: 69   Temp: 98.4 F (36.9 C)   SpO2: 97% 97%  Weight: 232 lb (105.2 kg)   Height: 5\' 5"  (1.651 m)   PainSc: 0-No pain    Body mass index is 38.61 kg/m.  Advanced Directives 12/21/2020 08/14/2020 08/13/2020 08/12/2020 04/30/2019 12/26/2018 10/27/2018  Does Patient Have a Medical Advance Directive? Yes Yes Yes Yes No Yes Yes  Type of Advance Directive Living will;Healthcare Power of [place];Living will - [place];Living will Living will [place];Living will  Does patient want to make changes to medical advance directive? No - Patient declined - No - Patient declined - - - -  Copy of [place] in Chart? - - No - copy requested - No - copy requested - No - copy requested    Current Medications (verified) Outpatient Encounter Medications as of 12/21/2020  Medication Sig  . acetaminophen (TYLENOL) 500 MG tablet Take 1,000 mg by mouth every 6 (six) hours as needed for mild pain or moderate pain.  Marland Kitchen atorvastatin (LIPITOR) 20 MG tablet TAKE 1 TABLET BY MOUTH EVERY DAY  . cetirizine (ZYRTEC) 10 MG tablet Take 10 mg by mouth daily.  . Cholecalciferol (VITAMIN D3) 1.25 MG (50000 UT) CAPS Take 1 capsule by mouth every 30 (thirty) days.  Marland Kitchen ELIQUIS 2.5 MG TABS tablet TAKE 1 TABLET BY MOUTH TWICE A DAY  . famotidine (PEPCID) 40 MG tablet Take 1 tablet (40 mg total) by mouth at bedtime.  . fluticasone (FLONASE) 50  MCG/ACT nasal spray SPRAY 2 SPRAYS INTO EACH NOSTRIL EVERY DAY  . furosemide (LASIX) 40 MG tablet Take 1 tablet (40 mg total) by mouth daily as needed.  . latanoprost (XALATAN) 0.005 % ophthalmic solution Place 1 drop into both eyes at bedtime.  . metoprolol succinate (TOPROL-XL) 25 MG 24 hr tablet TAKE 1 TABLET BY MOUTH EVERY DAY  . oxyCODONE (ROXICODONE) 15 MG immediate release tablet Take 1 tablet (15 mg total) by mouth 4 (four) times daily.  Marland Kitchen oxyCODONE (ROXICODONE) 15 MG immediate release tablet Take 1 tablet (15 mg total) by mouth 4 (four) times daily.  Marland Kitchen oxyCODONE (ROXICODONE) 15 MG immediate release tablet Take 1 tablet (15 mg total) by mouth 4 (four) times daily.  . pantoprazole (PROTONIX) 40 MG tablet Take 1 tablet (40 mg total) by mouth 2 (two) times daily.  . polyethylene glycol (MIRALAX / GLYCOLAX) packet Take 17 g by mouth daily.  Marland Kitchen PRESCRIPTION MEDICATION Inhale into the lungs at bedtime. CPAP  . spironolactone (ALDACTONE) 25 MG tablet Take 1 tablet (25 mg total) by mouth daily.  Marland Kitchen telmisartan (MICARDIS) 20 MG tablet Take 1 tablet (20 mg total) by mouth daily.  . timolol (TIMOPTIC) 0.25 % ophthalmic solution Place 1 drop into both eyes 2 (two) times daily.   Marland Kitchen triamcinolone cream (KENALOG) 0.5 % Apply 1 application topically 3 (three) times daily.   No facility-administered encounter medications on file as of 12/21/2020.    Allergies (verified) Codeine phosphate,  Nsaids, and Adhesive [tape]   History: Past Medical History:  Diagnosis Date  . Allergic rhinitis   . Anemia, iron deficiency   . Arthritis   . Atrial arrhythmia   . Atrial fib/flutter, transient    following prior surgeries x 2  . Barrett's esophagus without dysplasia   . BPH (benign prostatic hyperplasia)   . Bright's disease   . Chronic LBP   . Closed fracture of right distal radius   . DDD (degenerative disc disease)   . Diverticulosis of colon   . Duodenal stricture   . Gastric outlet obstruction   .  GERD (gastroesophageal reflux disease)   . Glaucoma   . Hemorrhoids   . History of kidney stones   . Hyperlipidemia   . Hyperplastic colonic polyp 01/2000  . Hypertension   . Nephrolithiasis    hx of B  . Peptic ulcer disease with hemorrhage 08/2008   and [facility]. Pylori Ab negative  . Renal cyst   . Sleep apnea with use of continuous positive airway pressure (CPAP)   . Tubular adenoma of colon 03/2012  . Wears glasses    Past Surgical History:  Procedure Laterality Date  . APPENDECTOMY    . BACK SURGERY  02/2003   L5  . BILROTH I PROCEDURE  2011   Dr Johney Maine  . CLOSED REDUCTION WRIST FRACTURE Right 01/06/2019   Procedure: Closed Reduction Wrist/Pinning Wrist;  Surgeon: Dayna Barker, MD;  Location: [facility];  Service: Plastics;  Laterality: Right;  . COLONOSCOPY    . COLONOSCOPY WITH PROPOFOL N/A 08/14/2020   Procedure: COLONOSCOPY WITH PROPOFOL;  Surgeon: [name], MD;  Location: [facility];  Service: Endoscopy;  Laterality: N/A;  . HIATAL HERNIA REPAIR    . LAPAROTOMY N/A 05/10/2017   Procedure: EXPLORATORY LAPAROTOMY WITH ENTEROLYSIS;  Surgeon: Johnathan Hausen, MD;  Location: [facility];  Service: General;  Laterality: N/A;  . LUMBAR FUSION  2009  . SMALL INTESTINE SURGERY    . TONSILLECTOMY    . UPPER GASTROINTESTINAL ENDOSCOPY  07/26/2020   Family History  Problem Relation Age of Onset  . Colon polyps Mother   . Colon cancer Mother 69  . Heart disease Mother   . Colon polyps Father   . Colon cancer Father 9  . Prostate cancer Father   . Stroke Father   . Colon cancer Paternal Uncle 48  . Colon cancer Paternal Uncle 88  . Hypertension Other   . Colon cancer Cousin   . Drug abuse Sister        overdose  . Stomach cancer Neg Hx   . Esophageal cancer Neg Hx   . Pancreatic cancer Neg Hx   . Liver disease Neg Hx    Social History   Socioeconomic History  . Marital status: Married    Spouse name: Tobe Kervin  . Number of children: 1  . Years of education:  Not on file  . Highest education level: Not on file  Occupational History  . Occupation: PASTOR    Employer: [employer]  Tobacco Use  . Smoking status: Former Research scientist (life sciences)  . Smokeless tobacco: Never Used  . Tobacco comment: as a teenager  Vaping Use  . Vaping Use: Never used  Substance and Sexual Activity  . Alcohol use: No  . Drug use: No  . Sexual activity: Yes  Other Topics Concern  . Not on file  Social History Narrative   Patient gets regular exercise   Married x 42  years   Social Determinants of Health   Financial Resource Strain: Low Risk   . Difficulty of Paying Living Expenses: Not hard at all  Food Insecurity: No Food Insecurity  . Worried About Charity fundraiser in the Last Year: Never true  . Ran Out of Food in the Last Year: Never true  Transportation Needs: No Transportation Needs  . Lack of Transportation (Medical): No  . Lack of Transportation (Non-Medical): No  Physical Activity: Inactive  . Days of Exercise per Week: 0 days  . Minutes of Exercise per Session: 0 min  Stress: No Stress Concern Present  . Feeling of Stress : Not at all  Social Connections: Socially Integrated  . Frequency of Communication with Friends and Family: More than three times a week  . Frequency of Social Gatherings with Friends and Family: More than three times a week  . Attends Religious Services: More than 4 times per year  . Active Member of Clubs or Organizations: Yes  . Attends Archivist Meetings: More than 4 times per year  . Marital Status: Married    Tobacco Counseling Counseling given: Not Answered Comment: as a teenager   Clinical Intake:  Pre-visit preparation completed: Yes  Pain : No/denies pain Pain Score: 0-No pain     BMI - recorded: 38.61 Nutritional Status: BMI > 30  Obese Nutritional Risks: None Diabetes: No  How often do you need to have someone help you when you read instructions, pamphlets, or other written materials from  your doctor or pharmacy?: 1 - Never What is the last grade level you completed in school?: Doctorate Degreein Pastoral Counseling  Diabetic? no  Interpreter Needed?: No  Information entered by :: Lisette Abu, LPN.   Activities of Daily Living In your present state of health, do you have any difficulty performing the following activities: 12/21/2020 08/13/2020  Hearing? N N  Vision? N N  Difficulty concentrating or making decisions? N N  Walking or climbing stairs? N Y  Dressing or bathing? N N  Doing errands, shopping? N Y  Conservation officer, nature and eating ? N -  Using the Toilet? N -  In the past six months, have you accidently leaked urine? N -  Do you have problems with loss of bowel control? N -  Managing your Medications? N -  Managing your Finances? N -  Housekeeping or managing your Housekeeping? N -  Some recent data might be hidden    Patient Care Team: Plotnikov, Evie Lacks, MD as PCP - General Croitoru, Mihai, MD as PCP - Cardiology (Cardiology) Leeroy Cha, MD (Neurosurgery) Michael Boston, MD (General Surgery) Ladene Artist, MD (Gastroenterology) Franchot Gallo, MD as Attending Physician (Urology) Jacolyn Reedy, MD as Consulting Physician (Cardiology)  Indicate any recent Medical Services you may have received from other than [facility] providers in the past year (date may be approximate).     Assessment:   This is a routine wellness examination for [place].  Hearing/Vision screen No exam data present  Dietary issues and exercise activities discussed: Current Exercise Habits: The patient does not participate in regular exercise at present, Exercise limited by: cardiac condition(s);orthopedic condition(s)  Goals    . Patient Stated     Continue to inspire individuals where ever I go in a positive manner. No matter how small the situation, it is wonderful to know that I made the difference in someone's life.     . Want to lose 10  pounds  Continue  to walk on treadmill daily and monitor my diet closely to be as healthy as possible      Depression Screen PHQ 2/9 Scores 12/21/2020 09/12/2020 02/22/2020 10/27/2018 05/19/2018 02/22/2017 02/21/2016  PHQ - 2 Score 0 0 0 1 1 1 1   PHQ- 9 Score - - - - - 1 -    Fall Risk Fall Risk  12/21/2020 09/12/2020 05/23/2020 10/27/2018 05/19/2018  Falls in the past year? 0 1 0 1 No  Number falls in past yr: 0 0 0 0 -  Injury with Fall? 0 0 0 0 -  Risk for fall due to : Impaired balance/gait History of fall(s) - Impaired balance/gait;Impaired mobility -  Follow up - Falls evaluation completed - Falls prevention discussed;Education provided -    FALL RISK PREVENTION PERTAINING TO THE HOME:  Any stairs in or around the home? Yes  If so, are there any without handrails? No  Home free of loose throw rugs in walkways, pet beds, electrical cords, etc? Yes  Adequate lighting in your home to reduce risk of falls? Yes   ASSISTIVE DEVICES UTILIZED TO PREVENT FALLS:  Life alert? No  Use of a cane, walker or w/c? Yes  Grab bars in the bathroom? Yes  Shower chair or bench in shower? No  Elevated toilet seat or a handicapped toilet? Yes   TIMED UP AND GO:  Was the test performed? No .  Length of time to ambulate 10 feet: 0 sec.   Gait steady and fast without use of assistive device  Cognitive Function: Normal cognitive status assessed by direct observation by this Nurse Health Advisor. No abnormalities found.   MMSE - Mini Mental State Exam 02/22/2017  Orientation to time 5  Orientation to Place 5  Registration 3  Attention/ Calculation 3  Recall 3  Language- name 2 objects 2  Language- repeat 1  Language- follow 3 step command 3  Language- read & follow direction 1  Write a sentence 1  Copy design 1  Total score 28        Immunizations Immunization History  Administered Date(s) Administered  . DT (Pediatric) 02/09/2011  . Fluad Quad(high Dose 65+) 08/14/2020  . Influenza Split 08/13/2011,  08/19/2012  . Influenza Whole 09/26/2009, 08/01/2010  . Influenza, High Dose Seasonal PF 08/25/2013, 10/04/2015, 08/22/2016, 08/23/2017, 08/19/2018, 07/27/2019  . Influenza,inj,Quad PF,6+ Mos 08/25/2014  . PFIZER(Purple Top)SARS-COV-2 Vaccination 12/09/2019, 12/30/2019  . Pneumococcal Conjugate-13 11/22/2014  . Pneumococcal Polysaccharide-23 11/19/2011  . Td 05/26/2015  . Tdap 12/26/2018  . Zoster 02/17/2008    TDAP status: Up to date  Flu Vaccine status: Up to date  Pneumococcal vaccine status: Up to date  Covid-19 vaccine status: Completed vaccines  Qualifies for Shingles Vaccine? Yes   Zostavax completed Yes   Shingrix Completed?: No.    Education has been provided regarding the importance of this vaccine. Patient has been advised to call insurance company to determine out of pocket expense if they have not yet received this vaccine. Advised may also receive vaccine at local pharmacy or Health Dept. Verbalized acceptance and understanding.  Screening Tests Health Maintenance  Topic Date Due  . Hepatitis C Screening  Never done  . COVID-19 Vaccine (3 - Pfizer risk 4-dose series) 01/27/2020  . COLONOSCOPY (Pts 45-58yrs Insurance coverage will need to be confirmed)  08/14/2025  . TETANUS/TDAP  12/26/2028  . INFLUENZA VACCINE  Completed  . PNA vac Low Risk Adult  Completed    Health Maintenance  Health  Maintenance Due  Topic Date Due  . Hepatitis C Screening  Never done  . COVID-19 Vaccine (3 - Pfizer risk 4-dose series) 01/27/2020    Colorectal cancer screening: Type of screening: Colonoscopy. Completed 08/14/2020. Repeat every 5 years  Lung Cancer Screening: (Low Dose CT Chest recommended if Age 62-80 years, 30 pack-year currently smoking OR have quit w/in 15years.) does not qualify.   Lung Cancer Screening Referral: no  Additional Screening:  Hepatitis C Screening: does not qualify; Completed: no  Vision Screening: Recommended annual ophthalmology exams for early  detection of glaucoma and other disorders of the eye. Is the patient up to date with their annual eye exam?  Yes  Who is the provider or what is the name of the office in which the patient attends annual eye exams? [facility] If pt is not established with a provider, would they like to be referred to a provider to establish care? No .   Dental Screening: Recommended annual dental exams for proper oral hygiene  Community Resource Referral / Chronic Care Management: CRR required this visit?  No   CCM required this visit?  No      Plan:     I have personally reviewed and noted the following in the patient's chart:   . Medical and social history . Use of alcohol, tobacco or illicit drugs  . Current medications and supplements . Functional ability and status . Nutritional status . Physical activity . Advanced directives . List of other physicians . Hospitalizations, surgeries, and ER visits in previous 12 months . Vitals . Screenings to include cognitive, depression, and falls . Referrals and appointments  In addition, I have reviewed and discussed with patient certain preventive protocols, quality metrics, and best practice recommendations. A written personalized care plan for preventive services as well as general preventive health recommendations were provided to patient.     Sheral Flow, LPN   04/13/8314   Nurse Notes:  Patient was advised by Dr. Alain Marion to take an extra Telmisartan 20 mg today for elevated blood pressure.

## 2020-12-21 NOTE — Patient Instructions (Signed)
Cody Frazier , Thank you for taking time to come for your Medicare Wellness Visit. I appreciate your ongoing commitment to your health goals. Please review the following plan we discussed and let me know if I can assist you in the future.   Screening recommendations/referrals: Colonoscopy: 08/14/2020; due every 5 years  Recommended yearly ophthalmology/optometry visit for glaucoma screening and checkup Recommended yearly dental visit for hygiene and checkup  Vaccinations: Influenza vaccine: 08/14/2020 Pneumococcal vaccine: 11/19/2011, 11/22/2014 Tdap vaccine: 12/26/2018; due every 10 years Shingles vaccine: never done    Covid-19: 12/09/2019, 12/30/2019  Advanced directives: Please bring a copy of your health care power of attorney and living will to the office at your convenience.  Conditions/risks identified: Yes; Reviewed health maintenance screenings with patient today and relevant education, vaccines, and/or referrals were provided. Please continue to do your personal lifestyle choices by: daily care of teeth and gums, regular physical activity (goal should be 5 days a week for 30 minutes), eat a healthy diet, avoid tobacco and drug use, limiting any alcohol intake, taking a low-dose aspirin (if not allergic or have been advised by your provider otherwise) and taking vitamins and minerals as recommended by your provider. Continue doing brain stimulating activities (puzzles, reading, adult coloring books, staying active) to keep memory sharp. Continue to eat heart healthy diet (full of fruits, vegetables, whole grains, lean protein, water--limit salt, fat, and sugar intake) and increase physical activity as tolerated.  Next appointment: Please schedule your next Medicare Wellness Visit with your Nurse Health Advisor in 1 year by calling [phone].  Preventive Care 37 Years and Older, Male Preventive care refers to lifestyle choices and visits with your health care provider that can promote health  and wellness. What does preventive care include?  A yearly physical exam. This is also called an annual well check.  Dental exams once or twice a year.  Routine eye exams. Ask your health care provider how often you should have your eyes checked.  Personal lifestyle choices, including:  Daily care of your teeth and gums.  Regular physical activity.  Eating a healthy diet.  Avoiding tobacco and drug use.  Limiting alcohol use.  Practicing safe sex.  Taking low doses of aspirin every day.  Taking vitamin and mineral supplements as recommended by your health care provider. What happens during an annual well check? The services and screenings done by your health care provider during your annual well check will depend on your age, overall health, lifestyle risk factors, and family history of disease. Counseling  Your health care provider may ask you questions about your:  Alcohol use.  Tobacco use.  Drug use.  Emotional well-being.  Home and relationship well-being.  Sexual activity.  Eating habits.  History of falls.  Memory and ability to understand (cognition).  Work and work Statistician. Screening  You may have the following tests or measurements:  Height, weight, and BMI.  Blood pressure.  Lipid and cholesterol levels. These may be checked every 5 years, or more frequently if you are over 58 years old.  Skin check.  Lung cancer screening. You may have this screening every year starting at age 25 if you have a 30-pack-year history of smoking and currently smoke or have quit within the past 15 years.  Fecal occult blood test (FOBT) of the stool. You may have this test every year starting at age 47.  Flexible sigmoidoscopy or colonoscopy. You may have a sigmoidoscopy every 5 years or a colonoscopy every 10 years  starting at age 71.  Prostate cancer screening. Recommendations will vary depending on your family history and other risks.  Hepatitis C  blood test.  Hepatitis B blood test.  Sexually transmitted disease (STD) testing.  Diabetes screening. This is done by checking your blood sugar (glucose) after you have not eaten for a while (fasting). You may have this done every 1-3 years.  Abdominal aortic aneurysm (AAA) screening. You may need this if you are a current or former smoker.  Osteoporosis. You may be screened starting at age 56 if you are at high risk. Talk with your health care provider about your test results, treatment options, and if necessary, the need for more tests. Vaccines  Your health care provider may recommend certain vaccines, such as:  Influenza vaccine. This is recommended every year.  Tetanus, diphtheria, and acellular pertussis (Tdap, Td) vaccine. You may need a Td booster every 10 years.  Zoster vaccine. You may need this after age 32.  Pneumococcal 13-valent conjugate (PCV13) vaccine. One dose is recommended after age 67.  Pneumococcal polysaccharide (PPSV23) vaccine. One dose is recommended after age 46. Talk to your health care provider about which screenings and vaccines you need and how often you need them. This information is not intended to replace advice given to you by your health care provider. Make sure you discuss any questions you have with your health care provider. Document Released: 11/25/2015 Document Revised: 07/18/2016 Document Reviewed: 08/30/2015 Elsevier Interactive Patient Education  2017 [employer] [place] Prevention in the Home Falls can cause injuries. They can happen to people of all ages. There are many things you can do to make your home safe and to help prevent falls. What can I do on the outside of my home?  Regularly fix the edges of walkways and driveways and fix any cracks.  Remove anything that might make you trip as you walk through a door, such as a raised step or threshold.  Trim any bushes or trees on the path to your home.  Use bright outdoor  lighting.  Clear any walking paths of anything that might make someone trip, such as rocks or tools.  Regularly check to see if handrails are loose or broken. Make sure that both sides of any steps have handrails.  Any raised decks and porches should have guardrails on the edges.  Have any leaves, snow, or ice cleared regularly.  Use sand or salt on walking paths during winter.  Clean up any spills in your garage right away. This includes oil or grease spills. What can I do in the bathroom?  Use night lights.  Install grab bars by the toilet and in the tub and shower. Do not use towel bars as grab bars.  Use non-skid mats or decals in the tub or shower.  If you need to sit down in the shower, use a plastic, non-slip stool.  Keep the floor dry. Clean up any water that spills on the floor as soon as it happens.  Remove soap buildup in the tub or shower regularly.  Attach bath mats securely with double-sided non-slip rug tape.  Do not have throw rugs and other things on the floor that can make you trip. What can I do in the bedroom?  Use night lights.  Make sure that you have a light by your bed that is easy to reach.  Do not use any sheets or blankets that are too big for your bed. They should not hang down  onto the floor.  Have a firm chair that has side arms. You can use this for support while you get dressed.  Do not have throw rugs and other things on the floor that can make you trip. What can I do in the kitchen?  Clean up any spills right away.  Avoid walking on wet floors.  Keep items that you use a lot in easy-to-reach places.  If you need to reach something above you, use a strong step stool that has a grab bar.  Keep electrical cords out of the way.  Do not use floor polish or wax that makes floors slippery. If you must use wax, use non-skid floor wax.  Do not have throw rugs and other things on the floor that can make you trip. What can I do with my  stairs?  Do not leave any items on the stairs.  Make sure that there are handrails on both sides of the stairs and use them. Fix handrails that are broken or loose. Make sure that handrails are as long as the stairways.  Check any carpeting to make sure that it is firmly attached to the stairs. Fix any carpet that is loose or worn.  Avoid having throw rugs at the top or bottom of the stairs. If you do have throw rugs, attach them to the floor with carpet tape.  Make sure that you have a light switch at the top of the stairs and the bottom of the stairs. If you do not have them, ask someone to add them for you. What else can I do to help prevent falls?  Wear shoes that:  Do not have high heels.  Have rubber bottoms.  Are comfortable and fit you well.  Are closed at the toe. Do not wear sandals.  If you use a stepladder:  Make sure that it is fully opened. Do not climb a closed stepladder.  Make sure that both sides of the stepladder are locked into place.  Ask someone to hold it for you, if possible.  Clearly mark and make sure that you can see:  Any grab bars or handrails.  First and last steps.  Where the edge of each step is.  Use tools that help you move around (mobility aids) if they are needed. These include:  Canes.  Walkers.  Scooters.  Crutches.  Turn on the lights when you go into a dark area. Replace any light bulbs as soon as they burn out.  Set up your furniture so you have a clear path. Avoid moving your furniture around.  If any of your floors are uneven, fix them.  If there are any pets around you, be aware of where they are.  Review your medicines with your doctor. Some medicines can make you feel dizzy. This can increase your chance of falling. Ask your doctor what other things that you can do to help prevent falls. This information is not intended to replace advice given to you by your health care provider. Make sure you discuss any  questions you have with your health care provider. Document Released: 08/25/2009 Document Revised: 04/05/2016 Document Reviewed: 12/03/2014 Elsevier Interactive Patient Education  2017 [employer].

## 2020-12-22 ENCOUNTER — Telehealth: Payer: Self-pay | Admitting: Internal Medicine

## 2020-12-22 NOTE — Telephone Encounter (Signed)
Patient called to report his BP is now 172/92.   Patient also states his pan medication ran out today and would like a refill on that as well.  oxyCODONE (ROXICODONE) 15 MG immediate release tablet  Pharmacy on file.

## 2020-12-22 NOTE — Telephone Encounter (Signed)
Patient had a AWV yesterday and his BP was high and was told to call back today and let us know his BP and today it was 142/83 [phone]

## 2020-12-23 ENCOUNTER — Telehealth: Payer: Self-pay | Admitting: Cardiovascular Disease

## 2020-12-23 NOTE — Telephone Encounter (Signed)
Spoke with pt who report on Monday he experienced an Afib episode where he felt flutters and felt like his heart was racing. He also report felling really tired.   Then on 2/9 pt had an appointment with PCP and BP was 220/120. Pt was advised to go home, take an extra telmisartan, and lay down. Pt state BP later decreased to 145/90 HR 81. Pt report since then, he's noticed his BP is consistently staying elevated (reading listed below).  2/8 153/90 HR 81 12/23/20: 6 am 15092,  9:00158/92  Today pt report overall feeling well. Will route to MD for recommendations.

## 2020-12-23 NOTE — Telephone Encounter (Signed)
Pt c/o BP issue: STAT if pt c/o blurred vision, one-sided weakness or slurred speech  1. What are your last 5 BP readings?  12/21/20: 220/120 12/23/20: 6 am 150/92,  9:00158/92  2. Are you having any other symptoms (ex. Dizziness, headache, blurred vision, passed out)? Headaches   3. What is your BP issue? Patient went to his PCP and his BP was high. Patient also had some afib on Monday but that has subsided.    Patient c/o Palpitations:  High priority if patient c/o lightheadedness, shortness of breath, or chest pain  1) How long have you had palpitations/irregular HR/ Afib? Are you having the symptoms now? no  2) Are you currently experiencing lightheadedness, SOB or CP? no  3) Do you have a history of afib (atrial fibrillation) or irregular heart rhythm? yes  4) Have you checked your BP or HR? (document readings if available): see above   5) Are you experiencing any other symptoms? No, patient feels fine today    Patient just wanted to make Dr. Loletha Grayer aware of his recent symptoms. He just saw Dr. Loletha Grayer 2/2 and felt fine.

## 2020-12-23 NOTE — Telephone Encounter (Signed)
The patient has prescriptions for February and March at the pharmacy.   He needs to lose weight and cut back on salt Monitor blood pressure at home. Thanks

## 2020-12-25 NOTE — Telephone Encounter (Signed)
220/120 is indeed too high (and unusual for him). He was having a lot or orthostatic hypotension complaints, so I would set a target for his BP at <150/90. OK to take the telmisartan 20 mg on days that his BP is above that threshold.

## 2020-12-26 NOTE — Telephone Encounter (Signed)
Yes that's what I mean. My own not threw me off. Says "discontinue" telmisartan rather than decrease telmisartan to 20.

## 2020-12-26 NOTE — Telephone Encounter (Signed)
The patient has been called and verbalized his understanding. For the past three days he stated that he has been feeling good with blood pressures averaging around 125/80.

## 2021-01-02 ENCOUNTER — Other Ambulatory Visit: Payer: Self-pay | Admitting: Gastroenterology

## 2021-01-02 DIAGNOSIS — D508 Other iron deficiency anemias: Secondary | ICD-10-CM

## 2021-01-02 DIAGNOSIS — K2270 Barrett's esophagus without dysplasia: Secondary | ICD-10-CM

## 2021-01-10 ENCOUNTER — Other Ambulatory Visit: Payer: Self-pay

## 2021-01-10 ENCOUNTER — Ambulatory Visit (INDEPENDENT_AMBULATORY_CARE_PROVIDER_SITE_OTHER): Payer: Medicare Other | Admitting: Internal Medicine

## 2021-01-10 ENCOUNTER — Encounter: Payer: Self-pay | Admitting: Internal Medicine

## 2021-01-10 DIAGNOSIS — M519 Unspecified thoracic, thoracolumbar and lumbosacral intervertebral disc disorder: Secondary | ICD-10-CM | POA: Diagnosis not present

## 2021-01-10 DIAGNOSIS — I480 Paroxysmal atrial fibrillation: Secondary | ICD-10-CM

## 2021-01-10 DIAGNOSIS — R413 Other amnesia: Secondary | ICD-10-CM | POA: Diagnosis not present

## 2021-01-10 DIAGNOSIS — I119 Hypertensive heart disease without heart failure: Secondary | ICD-10-CM

## 2021-01-10 DIAGNOSIS — E785 Hyperlipidemia, unspecified: Secondary | ICD-10-CM | POA: Diagnosis not present

## 2021-01-10 DIAGNOSIS — I10 Essential (primary) hypertension: Secondary | ICD-10-CM | POA: Diagnosis not present

## 2021-01-10 DIAGNOSIS — E559 Vitamin D deficiency, unspecified: Secondary | ICD-10-CM | POA: Diagnosis not present

## 2021-01-10 DIAGNOSIS — I700 Atherosclerosis of aorta: Secondary | ICD-10-CM

## 2021-01-10 MED ORDER — OXYCODONE HCL 15 MG PO TABS: 15.0000 mg | ORAL_TABLET | Freq: Four times a day (QID) | ORAL | 0 refills | Status: DC

## 2021-01-10 NOTE — Assessment & Plan Note (Addendum)
May be a little better.  Continue with lion's Mane Mushroom extract or capsules for memory

## 2021-01-10 NOTE — Progress Notes (Signed)
Subjective:  Patient ID: Cody Frazier, male    DOB: [birth year]  Age: 75 y.o. MRN: [id number]  CC: Follow-up (3 month f/u)   HPI Cody Frazier presents for LBP, A fib, dyslipidemia f/u  Outpatient Medications Prior to Visit  Medication Sig Dispense Refill  . acetaminophen (TYLENOL) 500 MG tablet Take 1,000 mg by mouth every 6 (six) hours as needed for mild pain or moderate pain.    Marland Kitchen atorvastatin (LIPITOR) 20 MG tablet TAKE 1 TABLET BY MOUTH EVERY DAY 90 tablet 3  . cetirizine (ZYRTEC) 10 MG tablet Take 10 mg by mouth daily.    . Cholecalciferol (VITAMIN D3) 1.25 MG (50000 UT) CAPS Take 1 capsule by mouth every 30 (thirty) days. 3 capsule 3  . ELIQUIS 2.5 MG TABS tablet TAKE 1 TABLET BY MOUTH TWICE A DAY 60 tablet 11  . famotidine (PEPCID) 40 MG tablet Take 1 tablet (40 mg total) by mouth at bedtime. 30 tablet 11  . fluticasone (FLONASE) 50 MCG/ACT nasal spray SPRAY 2 SPRAYS INTO EACH NOSTRIL EVERY DAY 48 mL 5  . furosemide (LASIX) 40 MG tablet Take 1 tablet (40 mg total) by mouth daily as needed. 30 tablet 2  . latanoprost (XALATAN) 0.005 % ophthalmic solution Place 1 drop into both eyes at bedtime.  11  . metoprolol succinate (TOPROL-XL) 25 MG 24 hr tablet TAKE 1 TABLET BY MOUTH EVERY DAY 90 tablet 2  . oxyCODONE (ROXICODONE) 15 MG immediate release tablet Take 1 tablet (15 mg total) by mouth 4 (four) times daily. 120 tablet 0  . pantoprazole (PROTONIX) 40 MG tablet TAKE 1 TABLET BY MOUTH TWICE A DAY 180 tablet 0  . polyethylene glycol (MIRALAX / GLYCOLAX) packet Take 17 g by mouth daily. 14 each 0  . PRESCRIPTION MEDICATION Inhale into the lungs at bedtime. CPAP    . telmisartan (MICARDIS) 20 MG tablet Take 1 tablet (20 mg total) by mouth daily. 90 tablet 3  . timolol (TIMOPTIC) 0.25 % ophthalmic solution Place 1 drop into both eyes 2 (two) times daily.     Marland Kitchen triamcinolone cream (KENALOG) 0.5 % Apply 1 application topically 3 (three) times daily. 90 g 1  . spironolactone  (ALDACTONE) 25 MG tablet Take 1 tablet (25 mg total) by mouth daily. 90 tablet 1  . oxyCODONE (ROXICODONE) 15 MG immediate release tablet Take 1 tablet (15 mg total) by mouth 4 (four) times daily. 120 tablet 0  . oxyCODONE (ROXICODONE) 15 MG immediate release tablet Take 1 tablet (15 mg total) by mouth 4 (four) times daily. 120 tablet 0   No facility-administered medications prior to visit.    ROS: Review of Systems  Constitutional: Positive for fatigue. Negative for appetite change and unexpected weight change.  HENT: Negative for congestion, nosebleeds, sneezing, sore throat and trouble swallowing.   Eyes: Negative for itching and visual disturbance.  Respiratory: Negative for cough.   Cardiovascular: Negative for chest pain, palpitations and leg swelling.  Gastrointestinal: Negative for abdominal distention, blood in stool, diarrhea and nausea.  Genitourinary: Negative for frequency and hematuria.  Musculoskeletal: Positive for arthralgias, back pain and gait problem. Negative for joint swelling and neck pain.  Skin: Negative for rash.  Neurological: Negative for dizziness, tremors, speech difficulty and weakness.  Psychiatric/Behavioral: Negative for agitation, dysphoric mood and sleep disturbance. The patient is not nervous/anxious.     Objective:  BP (!) 142/84 (BP Location: Left Arm)   Pulse 88   Temp 98.6 F (37 C) (Oral)  Ht 5' 5.5" (1.664 m)   Wt 232 lb 6.4 oz (105.4 kg)   SpO2 96%   BMI 38.09 kg/m   BP Readings from Last 3 Encounters:  01/10/21 (!) 142/84  12/21/20 (!) 220/120  12/14/20 140/90    Wt Readings from Last 3 Encounters:  01/10/21 232 lb 6.4 oz (105.4 kg)  12/21/20 232 lb (105.2 kg)  12/14/20 227 lb (103 kg)    Physical Exam Constitutional:      General: He is not in acute distress.    Appearance: He is well-developed. He is obese.     Comments: NAD  HENT:     Mouth/Throat:     Mouth: Oropharynx is clear and moist.  Eyes:      Conjunctiva/sclera: Conjunctivae normal.     Pupils: Pupils are equal, round, and reactive to light.  Neck:     Thyroid: No thyromegaly.     Vascular: No JVD.  Cardiovascular:     Rate and Rhythm: Normal rate and regular rhythm.     Pulses: Intact distal pulses.     Heart sounds: Normal heart sounds. No murmur heard. No friction rub. No gallop.   Pulmonary:     Effort: Pulmonary effort is normal. No respiratory distress.     Breath sounds: Normal breath sounds. No wheezing or rales.  Chest:     Chest wall: No tenderness.  Abdominal:     General: Bowel sounds are normal. There is no distension.     Palpations: Abdomen is soft. There is no mass.     Tenderness: There is no abdominal tenderness. There is no guarding or rebound.  Musculoskeletal:        General: Tenderness present. No edema. Normal range of motion.     Cervical back: Normal range of motion.  Lymphadenopathy:     Cervical: No cervical adenopathy.  Skin:    General: Skin is warm and dry.     Findings: No rash.  Neurological:     Mental Status: He is alert and oriented to person, place, and time.     Cranial Nerves: No cranial nerve deficit.     Motor: No abnormal muscle tone.     Coordination: He displays a negative Romberg sign. Coordination normal.     Gait: Gait normal.     Deep Tendon Reflexes: Reflexes are normal and symmetric.  Psychiatric:        Mood and Affect: Mood and affect normal.        Behavior: Behavior normal.        Thought Content: Thought content normal.        Judgment: Judgment normal.   LS tender  Lab Results  Component Value Date   WBC 7.8 09/20/2020   HGB 13.0 09/20/2020   HCT 40.1 09/20/2020   PLT 317 09/20/2020   GLUCOSE 104 (H) 10/18/2020   CHOL 196 11/20/2018   TRIG 269.0 (H) 11/20/2018   HDL 43.60 11/20/2018   LDLDIRECT 109.0 11/20/2018   LDLCALC 86 11/25/2017   ALT 7 08/13/2020   AST 28 08/13/2020   NA 139 10/18/2020   K 4.8 10/18/2020   CL 101 10/18/2020    CREATININE 1.44 (H) 10/18/2020   BUN 26 10/18/2020   CO2 22 10/18/2020   TSH 1.04 05/23/2020   PSA 0.16 11/20/2018   INR 1.1 08/12/2020   HGBA1C 5.5 05/23/2020    DG Chest 2 View  Result Date: 08/13/2020 CLINICAL DATA:  Iron deficiency, anemia. EXAM: CHEST - 2  VIEW COMPARISON:  05/19/2018 FINDINGS: Low lung volumes. Mild diffuse interstitial prominence. No confluent airspace disease. Heart size upper limits normal for technique. Aortic Atherosclerosis (ICD10-170.0). Blunting of posterior costophrenic angles suggesting small effusions. No pneumothorax. Spondylitic changes near the thoracolumbar junction. IMPRESSION: Low volumes with small pleural effusions. Electronically Signed   By: Lucrezia Europe M.D.   On: 08/13/2020 14:14   CT ABDOMEN PELVIS W CONTRAST  Result Date: 08/13/2020 CLINICAL DATA:  Anemia.  History of gastric ulcers. EXAM: CT ABDOMEN AND PELVIS WITH CONTRAST TECHNIQUE: Multidetector CT imaging of the abdomen and pelvis was performed using the standard protocol following bolus administration of intravenous contrast. CONTRAST:  174mL OMNIPAQUE IOHEXOL 300 MG/ML  SOLN COMPARISON:  05/10/2017 FINDINGS: Lower chest: Coronary calcifications. New small pleural effusions, right greater than left. Dependent atelectasis in the posterior right lower lobe. Hepatobiliary: No focal liver abnormality is seen. Gallbladder unremarkable. CBD is mildly distended up to 12 mm diameter, seen down to the ampulla without radiodense calculus. Portal vein patent. Pancreas: Diffuse pancreatic atrophy without ductal dilatation. Spleen: Normal in size without focal abnormality. Adrenals/Urinary Tract: Adrenal glands unremarkable. Bilateral renal cysts, stable since previous. No hydronephrosis. Urinary bladder physiologically distended. Stomach/Bowel: Postop changes of [place] 1. Stomach and small bowel are nondilated. Appendix surgically absent. The colon is nondilated, with scattered descending and sigmoid  diverticula; no adjacent inflammatory change. Vascular/Lymphatic: Aortoiliac calcified atheromatous plaque without aneurysm or stenosis. Circumaortic left renal vein, an anatomic variant. Portal vein patent. No abdominal or pelvic adenopathy. Reproductive: Prostate is unremarkable. Other: Bilateral pelvic phleboliths.  No ascites.  No free air. Musculoskeletal: Thoracolumbar dextroscoliosis apex L2 multilevel thoracolumbar spondylitic change. Previous instrumented spinal fusion L1-S1. No fracture or worrisome bone lesion. IMPRESSION: 1. No acute findings. 2. New small bilateral pleural effusions, right greater than left. 3. Coronary and  Aortic Atherosclerosis (ICD10-I70.0). 4. Descending and sigmoid diverticulosis. Electronically Signed   By: Lucrezia Europe M.D.   On: 08/13/2020 14:12    Assessment & Plan:    Walker Kehr, MD

## 2021-01-10 NOTE — Assessment & Plan Note (Signed)
On vit D 

## 2021-01-10 NOTE — Assessment & Plan Note (Signed)
On Eliquis

## 2021-01-10 NOTE — Assessment & Plan Note (Signed)
BP Readings from Last 3 Encounters:  01/10/21 (!) 142/84  12/21/20 (!) 220/120  12/14/20 140/90

## 2021-01-10 NOTE — Assessment & Plan Note (Signed)
On Lipitor Continue with Lipitor 20 mg daily

## 2021-01-10 NOTE — Assessment & Plan Note (Addendum)
Continue with losartan 

## 2021-01-12 ENCOUNTER — Other Ambulatory Visit: Payer: Self-pay | Admitting: Cardiovascular Disease

## 2021-01-13 DIAGNOSIS — H401131 Primary open-angle glaucoma, bilateral, mild stage: Secondary | ICD-10-CM | POA: Diagnosis not present

## 2021-01-13 DIAGNOSIS — H2513 Age-related nuclear cataract, bilateral: Secondary | ICD-10-CM | POA: Diagnosis not present

## 2021-01-16 NOTE — Assessment & Plan Note (Signed)
Continue with oxycodone 15 mg 4 times a day as needed  Potential benefits of a long term opioids use as well as potential risks (i.e. addiction risk, apnea etc) and complications (i.e. Somnolence, constipation and others) were explained to the patient and were aknowledged. 

## 2021-01-17 ENCOUNTER — Ambulatory Visit: Payer: Medicare Other | Admitting: General Practice

## 2021-02-26 ENCOUNTER — Other Ambulatory Visit: Payer: Self-pay | Admitting: Cardiovascular Disease

## 2021-02-27 NOTE — Telephone Encounter (Signed)
Rx has been sent to the pharmacy electronically. ° °

## 2021-02-27 NOTE — Assessment & Plan Note (Signed)
On Lipitor 

## 2021-03-13 ENCOUNTER — Other Ambulatory Visit: Payer: Self-pay | Admitting: *Deleted

## 2021-03-13 ENCOUNTER — Encounter: Payer: Self-pay | Admitting: *Deleted

## 2021-03-13 DIAGNOSIS — I5032 Chronic diastolic (congestive) heart failure: Secondary | ICD-10-CM

## 2021-03-13 DIAGNOSIS — I10 Essential (primary) hypertension: Secondary | ICD-10-CM

## 2021-03-24 DIAGNOSIS — I5032 Chronic diastolic (congestive) heart failure: Secondary | ICD-10-CM | POA: Diagnosis not present

## 2021-03-24 DIAGNOSIS — I10 Essential (primary) hypertension: Secondary | ICD-10-CM | POA: Diagnosis not present

## 2021-03-24 LAB — CBC
Hematocrit: 41.5 % (ref 37.5–51.0)
Hemoglobin: 13.9 g/dL (ref 13.0–17.7)
MCH: 31.7 pg (ref 26.6–33.0)
MCHC: 33.5 g/dL (ref 31.5–35.7)
MCV: 95 fL (ref 79–97)
Platelets: 298 10*3/uL (ref 150–450)
RBC: 4.38 x10E6/uL (ref 4.14–5.80)
RDW: 11.8 % (ref 11.6–15.4)
WBC: 6.5 10*3/uL (ref 3.4–10.8)

## 2021-03-24 LAB — BASIC METABOLIC PANEL
BUN/Creatinine Ratio: 15 (ref 10–24)
BUN: 18 mg/dL (ref 8–27)
CO2: 21 mmol/L (ref 20–29)
Calcium: 10.5 mg/dL — ABNORMAL HIGH (ref 8.6–10.2)
Chloride: 102 mmol/L (ref 96–106)
Creatinine, Ser: 1.21 mg/dL (ref 0.76–1.27)
Glucose: 136 mg/dL — ABNORMAL HIGH (ref 65–99)
Potassium: 5.2 mmol/L (ref 3.5–5.2)
Sodium: 140 mmol/L (ref 134–144)
eGFR: 62 mL/min/{1.73_m2} (ref >59)

## 2021-03-30 ENCOUNTER — Encounter: Payer: Self-pay | Admitting: *Deleted

## 2021-04-03 ENCOUNTER — Other Ambulatory Visit: Payer: Self-pay | Admitting: Gastroenterology

## 2021-04-03 DIAGNOSIS — K2270 Barrett's esophagus without dysplasia: Secondary | ICD-10-CM

## 2021-04-03 DIAGNOSIS — D508 Other iron deficiency anemias: Secondary | ICD-10-CM

## 2021-04-12 ENCOUNTER — Ambulatory Visit (INDEPENDENT_AMBULATORY_CARE_PROVIDER_SITE_OTHER): Payer: Medicare Other | Admitting: Internal Medicine

## 2021-04-12 ENCOUNTER — Encounter: Payer: Self-pay | Admitting: Internal Medicine

## 2021-04-12 ENCOUNTER — Other Ambulatory Visit: Payer: Self-pay

## 2021-04-12 DIAGNOSIS — R609 Edema, unspecified: Secondary | ICD-10-CM | POA: Diagnosis not present

## 2021-04-12 DIAGNOSIS — E559 Vitamin D deficiency, unspecified: Secondary | ICD-10-CM

## 2021-04-12 DIAGNOSIS — R413 Other amnesia: Secondary | ICD-10-CM | POA: Diagnosis not present

## 2021-04-12 DIAGNOSIS — I10 Essential (primary) hypertension: Secondary | ICD-10-CM | POA: Diagnosis not present

## 2021-04-12 DIAGNOSIS — K219 Gastro-esophageal reflux disease without esophagitis: Secondary | ICD-10-CM | POA: Diagnosis not present

## 2021-04-12 DIAGNOSIS — I480 Paroxysmal atrial fibrillation: Secondary | ICD-10-CM | POA: Diagnosis not present

## 2021-04-12 DIAGNOSIS — M519 Unspecified thoracic, thoracolumbar and lumbosacral intervertebral disc disorder: Secondary | ICD-10-CM

## 2021-04-12 DIAGNOSIS — Z6835 Body mass index (BMI) 35.0-35.9, adult: Secondary | ICD-10-CM

## 2021-04-12 MED ORDER — OXYCODONE HCL 15 MG PO TABS: 15.0000 mg | ORAL_TABLET | Freq: Four times a day (QID) | ORAL | 0 refills | Status: DC

## 2021-04-12 NOTE — Assessment & Plan Note (Signed)
Cont w/Eliquis 

## 2021-04-12 NOTE — Assessment & Plan Note (Signed)
Better on Lion's mane

## 2021-04-12 NOTE — Assessment & Plan Note (Signed)
On Vit D 

## 2021-04-12 NOTE — Assessment & Plan Note (Signed)
Worse - R foot Furosemide prn

## 2021-04-12 NOTE — Assessment & Plan Note (Signed)
Better  

## 2021-04-12 NOTE — Progress Notes (Signed)
Subjective:  Patient ID: Cody Frazier, male    DOB: [birth year]  Age: 75 y.o. MRN: [id number]  CC: Follow-up (3 month f/u)   HPI Cody Frazier presents for LBP, A fib, HTN, obesity f/u  Outpatient Medications Prior to Visit  Medication Sig Dispense Refill  . acetaminophen (TYLENOL) 500 MG tablet Take 1,000 mg by mouth every 6 (six) hours as needed for mild pain or moderate pain.    Marland Kitchen atorvastatin (LIPITOR) 20 MG tablet TAKE 1 TABLET BY MOUTH EVERY DAY 90 tablet 3  . cetirizine (ZYRTEC) 10 MG tablet Take 10 mg by mouth daily.    . Cholecalciferol (VITAMIN D3) 1.25 MG (50000 UT) CAPS Take 1 capsule by mouth every 30 (thirty) days. 3 capsule 3  . ELIQUIS 2.5 MG TABS tablet TAKE 1 TABLET BY MOUTH TWICE A DAY 60 tablet 11  . famotidine (PEPCID) 40 MG tablet Take 1 tablet (40 mg total) by mouth at bedtime. 30 tablet 11  . fluticasone (FLONASE) 50 MCG/ACT nasal spray SPRAY 2 SPRAYS INTO EACH NOSTRIL EVERY DAY 48 mL 5  . latanoprost (XALATAN) 0.005 % ophthalmic solution Place 1 drop into both eyes at bedtime.  11  . metoprolol succinate (TOPROL-XL) 25 MG 24 hr tablet TAKE 1 TABLET BY MOUTH EVERY DAY 90 tablet 2  . oxyCODONE (ROXICODONE) 15 MG immediate release tablet Take 1 tablet (15 mg total) by mouth 4 (four) times daily. 120 tablet 0  . pantoprazole (PROTONIX) 40 MG tablet TAKE 1 TABLET BY MOUTH TWICE A DAY 180 tablet 0  . polyethylene glycol (MIRALAX / GLYCOLAX) packet Take 17 g by mouth daily. 14 each 0  . PRESCRIPTION MEDICATION Inhale into the lungs at bedtime. CPAP    . spironolactone (ALDACTONE) 25 MG tablet TAKE 1 TABLET BY MOUTH EVERY DAY 90 tablet 3  . telmisartan (MICARDIS) 20 MG tablet Take 1 tablet (20 mg total) by mouth daily. 90 tablet 3  . timolol (TIMOPTIC) 0.25 % ophthalmic solution Place 1 drop into both eyes 2 (two) times daily.     Marland Kitchen triamcinolone cream (KENALOG) 0.5 % Apply 1 application topically 3 (three) times daily. 90 g 1  . furosemide (LASIX) 40 MG tablet  Take 1 tablet (40 mg total) by mouth daily as needed. 30 tablet 2  . oxyCODONE (ROXICODONE) 15 MG immediate release tablet Take 1 tablet (15 mg total) by mouth 4 (four) times daily. 120 tablet 0  . oxyCODONE (ROXICODONE) 15 MG immediate release tablet Take 1 tablet (15 mg total) by mouth 4 (four) times daily. 120 tablet 0   No facility-administered medications prior to visit.    ROS: Review of Systems  Constitutional: Positive for fatigue. Negative for appetite change and unexpected weight change.  HENT: Negative for congestion, nosebleeds, sneezing, sore throat and trouble swallowing.   Eyes: Negative for itching and visual disturbance.  Respiratory: Negative for cough.   Cardiovascular: Negative for chest pain, palpitations and leg swelling.  Gastrointestinal: Negative for abdominal distention, blood in stool, diarrhea and nausea.  Genitourinary: Negative for frequency and hematuria.  Musculoskeletal: Positive for arthralgias, back pain and gait problem. Negative for joint swelling and neck pain.  Skin: Negative for rash.  Neurological: Negative for dizziness, tremors, speech difficulty and weakness.  Psychiatric/Behavioral: Negative for agitation, dysphoric mood, sleep disturbance and suicidal ideas. The patient is not nervous/anxious.     Objective:  BP 124/78 (BP Location: Left Arm)   Pulse 84   Temp 99.1 F (37.3 C) (Oral)  Ht 5' 5.5" (1.664 m)   Wt 230 lb 6.4 oz (104.5 kg)   SpO2 95%   BMI 37.76 kg/m   BP Readings from Last 3 Encounters:  04/12/21 124/78  01/10/21 (!) 142/84  12/21/20 (!) 220/120    Wt Readings from Last 3 Encounters:  04/12/21 230 lb 6.4 oz (104.5 kg)  01/10/21 232 lb 6.4 oz (105.4 kg)  12/21/20 232 lb (105.2 kg)    Physical Exam Constitutional:      General: He is not in acute distress.    Appearance: He is well-developed. He is obese.     Comments: NAD  Eyes:     Conjunctiva/sclera: Conjunctivae normal.     Pupils: Pupils are equal,  round, and reactive to light.  Neck:     Thyroid: No thyromegaly.     Vascular: No JVD.  Cardiovascular:     Rate and Rhythm: Normal rate and regular rhythm.     Heart sounds: Normal heart sounds. No murmur heard. No friction rub. No gallop.   Pulmonary:     Effort: Pulmonary effort is normal. No respiratory distress.     Breath sounds: Normal breath sounds. No wheezing or rales.  Chest:     Chest wall: No tenderness.  Abdominal:     General: Bowel sounds are normal. There is no distension.     Palpations: Abdomen is soft. There is no mass.     Tenderness: There is no abdominal tenderness. There is no guarding or rebound.  Musculoskeletal:        General: Tenderness present. Normal range of motion.     Cervical back: Normal range of motion.  Lymphadenopathy:     Cervical: No cervical adenopathy.  Skin:    General: Skin is warm and dry.     Findings: No rash.  Neurological:     Mental Status: He is alert and oriented to person, place, and time.     Cranial Nerves: No cranial nerve deficit.     Motor: No abnormal muscle tone.     Coordination: Coordination normal.     Gait: Gait normal.     Deep Tendon Reflexes: Reflexes are normal and symmetric.  Psychiatric:        Behavior: Behavior normal.        Thought Content: Thought content normal.        Judgment: Judgment normal.   Obese LS w/pain Using a cane  Lab Results  Component Value Date   WBC 6.5 03/24/2021   HGB 13.9 03/24/2021   HCT 41.5 03/24/2021   PLT 298 03/24/2021   GLUCOSE 136 (H) 03/24/2021   CHOL 196 11/20/2018   TRIG 269.0 (H) 11/20/2018   HDL 43.60 11/20/2018   LDLDIRECT 109.0 11/20/2018   LDLCALC 86 11/25/2017   ALT 7 08/13/2020   AST 28 08/13/2020   NA 140 03/24/2021   K 5.2 03/24/2021   CL 102 03/24/2021   CREATININE 1.21 03/24/2021   BUN 18 03/24/2021   CO2 21 03/24/2021   TSH 1.04 05/23/2020   PSA 0.16 11/20/2018   INR 1.1 08/12/2020   HGBA1C 5.5 05/23/2020    DG Chest 2  View  Result Date: 08/13/2020 CLINICAL DATA:  Iron deficiency, anemia. EXAM: CHEST - 2 VIEW COMPARISON:  05/19/2018 FINDINGS: Low lung volumes. Mild diffuse interstitial prominence. No confluent airspace disease. Heart size upper limits normal for technique. Aortic Atherosclerosis (ICD10-170.0). Blunting of posterior costophrenic angles suggesting small effusions. No pneumothorax. Spondylitic changes near the thoracolumbar junction.  IMPRESSION: Low volumes with small pleural effusions. Electronically Signed   By: Lucrezia Europe M.D.   On: 08/13/2020 14:14   CT ABDOMEN PELVIS W CONTRAST  Result Date: 08/13/2020 CLINICAL DATA:  Anemia.  History of gastric ulcers. EXAM: CT ABDOMEN AND PELVIS WITH CONTRAST TECHNIQUE: Multidetector CT imaging of the abdomen and pelvis was performed using the standard protocol following bolus administration of intravenous contrast. CONTRAST:  128mL OMNIPAQUE IOHEXOL 300 MG/ML  SOLN COMPARISON:  05/10/2017 FINDINGS: Lower chest: Coronary calcifications. New small pleural effusions, right greater than left. Dependent atelectasis in the posterior right lower lobe. Hepatobiliary: No focal liver abnormality is seen. Gallbladder unremarkable. CBD is mildly distended up to 12 mm diameter, seen down to the ampulla without radiodense calculus. Portal vein patent. Pancreas: Diffuse pancreatic atrophy without ductal dilatation. Spleen: Normal in size without focal abnormality. Adrenals/Urinary Tract: Adrenal glands unremarkable. Bilateral renal cysts, stable since previous. No hydronephrosis. Urinary bladder physiologically distended. Stomach/Bowel: Postop changes of [place] 1. Stomach and small bowel are nondilated. Appendix surgically absent. The colon is nondilated, with scattered descending and sigmoid diverticula; no adjacent inflammatory change. Vascular/Lymphatic: Aortoiliac calcified atheromatous plaque without aneurysm or stenosis. Circumaortic left renal vein, an anatomic variant.  Portal vein patent. No abdominal or pelvic adenopathy. Reproductive: Prostate is unremarkable. Other: Bilateral pelvic phleboliths.  No ascites.  No free air. Musculoskeletal: Thoracolumbar dextroscoliosis apex L2 multilevel thoracolumbar spondylitic change. Previous instrumented spinal fusion L1-S1. No fracture or worrisome bone lesion. IMPRESSION: 1. No acute findings. 2. New small bilateral pleural effusions, right greater than left. 3. Coronary and  Aortic Atherosclerosis (ICD10-I70.0). 4. Descending and sigmoid diverticulosis. Electronically Signed   By: Lucrezia Europe M.D.   On: 08/13/2020 14:12    Assessment & Plan:    Walker Kehr, MD

## 2021-04-12 NOTE — Assessment & Plan Note (Signed)
No change Cont w/Pantoprazole

## 2021-04-12 NOTE — Assessment & Plan Note (Signed)
Chronic Continue with oxycodone 15 mg 4 times a day as needed  Potential benefits of a long term opioids use as well as potential risks (i.e. addiction risk, apnea etc) and complications (i.e. Somnolence, constipation and others) were explained to the patient and were aknowledged. 

## 2021-04-12 NOTE — Assessment & Plan Note (Signed)
Better BP

## 2021-05-23 ENCOUNTER — Telehealth: Payer: Self-pay | Admitting: Internal Medicine

## 2021-05-23 NOTE — Telephone Encounter (Signed)
[facility] called and said that they do not have enough tablets to fill oxyCODONE (ROXICODONE) 15 MG immediate release tablet. She said [facility] [address], [place], [place] [address] can fill it for the patient.

## 2021-05-25 MED ORDER — OXYCODONE HCL 15 MG PO TABS: 15.0000 mg | ORAL_TABLET | Freq: Four times a day (QID) | ORAL | 0 refills | Status: DC

## 2021-05-25 NOTE — Telephone Encounter (Signed)
Ok Thx 

## 2021-05-25 NOTE — Telephone Encounter (Signed)
His pharmacy had a Rx for 7/12 Thx

## 2021-05-25 NOTE — Telephone Encounter (Signed)
The pharmacy does not have enough tablets to fill the script. He is wanting rx sent to [facility] on [address].Marland KitchenJohny Frazier

## 2021-06-07 ENCOUNTER — Telehealth: Payer: Self-pay | Admitting: Cardiovascular Disease

## 2021-06-07 NOTE — Telephone Encounter (Signed)
    Pt c/o of Chest Pain: STAT if CP now or developed within 24 hours  1. Are you having CP right now? No  2. Are you experiencing any other symptoms (ex. SOB, nausea, vomiting, sweating)? A little SOB  3. How long have you been experiencing CP? Started this morning  4. Is your CP continuous or coming and going?   5. Have you taken Nitroglycerin? No    Pt said he had an episode this morning, he described it as an odd sensation, pinching and fluttering on his chest, he said he had a little sob with it and his BP goes up and down, he had not taken anything for it and the pain resolved it self. He wanted to get an appt to see Dr. Loletha Grayer or APP sooner than 08/04/21 ?

## 2021-06-07 NOTE — Telephone Encounter (Signed)
Spoke with patient and he had episode of chest pain today lasting about 15 min.  Has had some shortness of breath recently  Denies any other symptoms Patient requested appointment sooner than September  Scheduled appointment with Blima Ledger NP 8/3, ED precautions reviewed with patient Patient aware of date, time and location of appointment

## 2021-06-11 NOTE — Progress Notes (Signed)
Cardiology Office Note:    Date:  06/14/2021   ID:  Cody Frazier, DOB May 23, 1946, MRN [id number]  PCP:  Cody Anger, MD   [facility] Providers Cardiologist:  Cody Klein, MD      Referring MD: Cody Anger, MD   Evaluation of chest pain  History of Present Illness:     Cody Frazier is a PMH of hypertensive heart disease without CHF, paroxysmal atrial fibrillation, aortic atherosclerosis, pulmonary hypertension, nonrheumatic aortic valve stenosis, essential hypertension, OSA, GERD, dyslipidemia, iron deficiency anemia, DOE, obesity, memory difficulties, Barrett's esophagus, gastric ulcers and anxiety.   He was diagnosed with severe anemia with a hemoglobin of 3.5.  He received transfusions and was discharged with a hemoglobin of 9.3.  The increase in hemoglobin after they have his symptoms of weakness and increased his stamina.  However, he still noted mild dyspnea.  He has been monitoring his blood pressures and reported blood pressures in the 160s over 80s-low 90s.  And endoscopic evaluation showed evidence of gastritis, but no source of bleeding.  A capsule enteroscopy was unsuccessful due to the camera pill never leaving the stomach.   He was noted to have some lower extremity edema but denied orthopnea and PND.  He reported compliance with his CPAP and denied anginal symptoms.  He also denied palpitations, dizziness, syncope, claudication, and neurological sequela.  He did note some poor short-term memory.   He had a repeat EGD 9/15 for surveillance of Barrett's esophagus.  In addition to his segments of Barrett's esophagus there was evidence of gastritis and nonbleeding duodenal diverticulum and evidence of previous fundoplasty.  No active bleeding was identified.   He was  seen by Cody Frazier on 09/06/2020.  His EKG showed normal sinus rhythm with a QTC of 446 ms.  His blood pressure was elevated at 162/92 and his pulse was 83.  Cody Frazier 2-3.  He was  noted to have some bilateral lower extremity edema with an 8-10 pound weight gain.  His furosemide was increased to 60 mg daily and Spironolactone was added.  His telmisartan was also increased to 80 mg daily.   He presented to the clinic 10/18/20 for follow-up evaluation and stated he felt well .  His blood pressure continued to be somewhat labile on December 1 he noted his blood pressure was 172/73.  He took a dose of his 40 mg telmisartan which lowered his blood pressure to 97/70.  He reported that this blood pressure decrease made him feel dizzy/lightheaded.  His other blood pressures had been in the 110s over 60s-70s since that time.  He had been very strict with the salt in his diet.  He was somewhat limited in his physical activity due to chronic back pain.  He reported that he just traveled to [place] with his family and had a wonderful time.  He did not weigh himself daily but stated that he monitored for lower extremity edema.  I  decreased his telmisartan to 20 mg and  instructed him to take it for a blood pressure at or greater than [id number] systolic.  He may take a second dose if he has no response to the medication.  I will gave him the salty 6 diet sheet, the [name] support stocking sheet, asked him weigh himself daily and call the office with a weight increase of 3 pounds overnight or 5 pounds in 1 week.  We  ordered a BMP  and planned follow-up in 3 months and  as needed.  He followed up with Cody Frazier on December 14, 2020.  He reported he felt improved after the correction of his anemia.  His blood pressure was in the 120s-130s over 60s-70s with a heart rate of 60-70.  He reported that he would occasionally feel lightheaded with standing up.  His telmisartan was discontinued.  Contacted nurse triage line on 06/07/2021.  He reported some chest pain that would last about 15 minutes.  Recent increased shortness of breath.  He requested a sooner appointment.  He presents the clinic today for  evaluation states he feels well.  He had 1 episode of chest discomfort has subsided with rest.  He reports that he is not as physically active as he feels he should be however with increased bouts of physical activity he has not had any further chest discomfort.  He has not noticed any episodes of bleeding.  He continues to enjoy traveling with his wife.  His EKG today shows normal sinus rhythm 71 bpm with no acute changes.  I have asked him to increase his physical activity as tolerated and continue to follow a heart healthy low-sodium diet.  We will have him follow-up with Cody Frazier in February.  Today he denies chest pain, shortness of breath, lower extremity edema, fatigue, palpitations, melena, hematuria, hemoptysis, diaphoresis, weakness, presyncope, syncope, orthopnea, and PND.   Past Medical History:  Diagnosis Date   Allergic rhinitis    Anemia, iron deficiency    Arthritis    Atrial arrhythmia    Atrial fib/flutter, transient    following prior surgeries x 2   Barrett's esophagus without dysplasia    BPH (benign prostatic hyperplasia)    Bright's disease    Chronic LBP    Closed fracture of right distal radius    DDD (degenerative disc disease)    Diverticulosis of colon    Duodenal stricture    Gastric outlet obstruction    GERD (gastroesophageal reflux disease)    Glaucoma    Hemorrhoids    History of kidney stones    Hyperlipidemia    Hyperplastic colonic polyp 01/2000   Hypertension    Nephrolithiasis    hx of B   Peptic ulcer disease with hemorrhage 08/2008   and [facility]. Pylori Ab negative   Renal cyst    Sleep apnea with use of continuous positive airway pressure (CPAP)    Tubular adenoma of colon 03/2012   Wears glasses     Past Surgical History:  Procedure Laterality Date   APPENDECTOMY     BACK SURGERY  02/2003   L5   BILROTH I PROCEDURE  2011   Dr Cody Frazier   CLOSED REDUCTION WRIST FRACTURE Right 01/06/2019   Procedure: Closed Reduction Wrist/Pinning Wrist;   Surgeon: Cody Barker, MD;  Location: [place];  Service: Plastics;  Laterality: Right;   COLONOSCOPY     COLONOSCOPY WITH PROPOFOL N/A 08/14/2020   Procedure: COLONOSCOPY WITH PROPOFOL;  Surgeon: [name], MD;  Location: [facility] END[facility]COPY;  Service: Endoscopy;  Laterality: N/A;   HIATAL HERNIA REPAIR     LAPAROTOMY N/A 05/10/2017   Procedure: EXPLORATORY LAPAROTOMY WITH ENTEROLYSIS;  Surgeon: Johnathan Hausen, MD;  Location: [facility];  Service: General;  Laterality: N/A;   LUMBAR FUSION  2009   SMALL INTESTINE SURGERY     TONSILLECTOMY     UPPER GASTROINTESTINAL ENDOSCOPY  07/26/2020    Current Medications: Current Meds  Medication Sig   acetaminophen (TYLENOL) 500 MG tablet Take 1,000  mg by mouth every 6 (six) hours as needed for mild pain or moderate pain.   atorvastatin (LIPITOR) 20 MG tablet TAKE 1 TABLET BY MOUTH EVERY DAY   cetirizine (ZYRTEC) 10 MG tablet Take 10 mg by mouth daily.   Cholecalciferol (VITAMIN D3) 1.25 MG (50000 UT) CAPS Take 1 capsule by mouth every 30 (thirty) days.   ELIQUIS 2.5 MG TABS tablet TAKE 1 TABLET BY MOUTH TWICE A DAY   famotidine (PEPCID) 40 MG tablet Take 1 tablet (40 mg total) by mouth at bedtime.   fluticasone (FLONASE) 50 MCG/ACT nasal spray SPRAY 2 SPRAYS INTO EACH NOSTRIL EVERY DAY   latanoprost (XALATAN) 0.005 % ophthalmic solution Place 1 drop into both eyes at bedtime.   metoprolol succinate (TOPROL-XL) 25 MG 24 hr tablet TAKE 1 TABLET BY MOUTH EVERY DAY   oxyCODONE (ROXICODONE) 15 MG immediate release tablet Take 1 tablet (15 mg total) by mouth 4 (four) times daily.   oxyCODONE (ROXICODONE) 15 MG immediate release tablet Take 1 tablet (15 mg total) by mouth 4 (four) times daily.   oxyCODONE (ROXICODONE) 15 MG immediate release tablet Take 1 tablet (15 mg total) by mouth 4 (four) times daily.   pantoprazole (PROTONIX) 40 MG tablet TAKE 1 TABLET BY MOUTH TWICE A DAY   polyethylene glycol (MIRALAX / GLYCOLAX) packet Take 17 g by mouth daily.    PRESCRIPTION MEDICATION Inhale into the lungs at bedtime. CPAP   spironolactone (ALDACTONE) 25 MG tablet TAKE 1 TABLET BY MOUTH EVERY DAY   telmisartan (MICARDIS) 20 MG tablet Take 1 tablet (20 mg total) by mouth daily.   timolol (TIMOPTIC) 0.25 % ophthalmic solution Place 1 drop into both eyes 2 (two) times daily.    triamcinolone cream (KENALOG) 0.5 % Apply 1 application topically 3 (three) times daily.     Allergies:   Codeine phosphate, Nsaids, and Adhesive [tape]   Social History   Socioeconomic History   Marital status: Married    Spouse name: Benjamen Napieralski   Number of children: 1   Years of education: Not on file   Highest education level: Not on file  Occupational History   Occupation: PASTOR    Employer: [employer]  Tobacco Use   Smoking status: Former   Smokeless tobacco: Never   Tobacco comments:    as a teenager  Vaping Use   Vaping Use: Never used  Substance and Sexual Activity   Alcohol use: No   Drug use: No   Sexual activity: Yes  Other Topics Concern   Not on file  Social History Narrative   Patient gets regular exercise   Married x 42 years   Social Determinants of Radio broadcast assistant Strain: Low Risk    Difficulty of Paying Living Expenses: Not hard at all  Food Insecurity: No Food Insecurity   Worried About Charity fundraiser in the Last Year: Never true   Arboriculturist in the Last Year: Never true  Transportation Needs: No Transportation Needs   Lack of Transportation (Medical): No   Lack of Transportation (Non-Medical): No  Physical Activity: Inactive   Days of Exercise per Week: 0 days   Minutes of Exercise per Session: 0 min  Stress: No Stress Concern Present   Feeling of Stress : Not at all  Social Connections: Socially Integrated   Frequency of Communication with Friends and Family: More than three times a week   Frequency of Social Gatherings with Friends and Family: More  than three times a week   Attends  Religious Services: More than 4 times per year   Active Member of Clubs or Organizations: Yes   Attends Music therapist: More than 4 times per year   Marital Status: Married     Family History: The patient's family history includes Colon cancer in his cousin; Colon cancer (age of onset: 40) in his paternal uncle and paternal uncle; Colon cancer (age of onset: 27) in his father; Colon cancer (age of onset: 41) in his mother; Colon polyps in his father and mother; Drug abuse in his sister; Heart disease in his mother; Hypertension in an other family member; Prostate cancer in his father; Stroke in his father. There is no history of Stomach cancer, Esophageal cancer, Pancreatic cancer, or Liver disease.  ROS:   Please see the history of present illness.     All other systems reviewed and are negative.   Risk Assessment/Calculations:           Physical Exam:    VS:  BP 122/78   Pulse 71   Ht '5\' 6"'$  (1.676 m)   Wt 231 lb (104.8 kg)   SpO2 98%   BMI 37.28 kg/m     Wt Readings from Last 3 Encounters:  06/14/21 231 lb (104.8 kg)  04/12/21 230 lb 6.4 oz (104.5 kg)  01/10/21 232 lb 6.4 oz (105.4 kg)     GEN: Well nourished, well developed in no acute distress HEENT: Normal NECK: No JVD; No carotid bruits LYMPHATICS: No lymphadenopathy CARDIAC: RRR, no murmurs, rubs, gallops RESPIRATORY:  Clear to auscultation without rales, wheezing or rhonchi  ABDOMEN: Soft, non-tender, non-distended MUSCULOSKELETAL:  No edema; No deformity  SKIN: Warm and dry NEUROLOGIC:  Alert and oriented x 3 PSYCHIATRIC:  Normal affect    EKGs/Labs/Other Studies Reviewed:    The following studies were reviewed today: Echocardiogram 08/03/2020   IMPRESSIONS     1. Left ventricular ejection fraction, by estimation, is 60 to 65%. The  left ventricle has normal function. The left ventricle has no regional  wall motion abnormalities. Left ventricular diastolic parameters are  consistent  with Grade I diastolic  dysfunction (impaired relaxation).   2. Right ventricular systolic function is normal. The right ventricular  size is normal. There is mildly elevated pulmonary artery systolic  pressure. The estimated right ventricular systolic pressure is [id number] mmHg.   3. The mitral valve is normal in structure. Trivial mitral valve  regurgitation. No evidence of mitral stenosis.   4. The aortic valve is tricuspid. Aortic valve regurgitation is not  visualized. Mild aortic valve stenosis. Aortic valve area, by VTI measures  1.54 cm. Aortic valve mean gradient measures 12.0 mmHg.   5. The inferior vena cava is normal in size with greater than 50%  respiratory variability, suggesting right atrial pressure of 3 mmHg.   EKG:  EKG is ordered today.  The ekg ordered today demonstrates normal sinus rhythm 71 bpm  Recent Labs: 07/29/2020: BNP 84.0 08/13/2020: ALT 7 08/14/2020: Magnesium 2.2 03/24/2021: BUN 18; Creatinine, Ser 1.21; Hemoglobin 13.9; Platelets 298; Potassium 5.2; Sodium 140  Recent Lipid Panel    Component Value Date/Time   CHOL 196 11/20/2018 1532   TRIG 269.0 (H) 11/20/2018 1532   TRIG 231 (HH) 09/13/2006 0957   HDL 43.60 11/20/2018 1532   CHOLHDL 4 11/20/2018 1532   VLDL 53.8 (H) 11/20/2018 1532   LDLCALC 86 11/25/2017 1021   LDLDIRECT 109.0 11/20/2018 1532    ASSESSMENT &  PLAN    Chest discomfort-denies chest pain/discomfort today.  Denies arm neck and back pain.  Had 1 episode of chest discomfort that lasted for about 15 minutes.  Has not had any further episodes of chest discomfort with increased physical activity.  Echocardiogram 08/03/2020 showed normal LVEF, G1 DD and mild aortic valve stenosis. No  ischemic evaluation planned at this time.  Chronic diastolic CHF-euvolemic today.  No increased activity intolerance or DOE.  Echocardiogram 08/03/2020 showed an LVEF of 60 to 65%, G1 DD, mildly elevated pulmonary artery systolic pressure at [id number] mmHg, trivial  mitral valve regurgitation, and mild aortic valve stenosis. Continue furosemide, spironolactone, metoprolol, diltiazem Heart healthy low-sodium diet Daily weights-contact office with a weight increase of 3 pounds overnight or 5 pounds in 1 week Elevate lower extremities when not active  Atrial fibrillation-heart rate today 71 bpm.  No recent events with increased heart rate or palpitations.    CHA2DS2-VASc score 2-3 Continue apixaban Heart healthy low-sodium diet-salty 6 given Increase physical activity as tolerated   Essential hypertension-BP today 122/78.  Well-controlled at home. Continue diltiazem, furosemide, metoprolol, spironolactone, telmisartan. Heart healthy low-sodium diet-salty 6 given Increase physical activity as tolerated   Obstructive sleep apnea-reports compliance with CPAP.  Explains he rest much better with CPAP use. Continue CPAP use    Disposition: Follow-up with Cody Frazier in February 2023       Medication Adjustments/Labs and Tests Ordered: Current medicines are reviewed at length with the patient today.  Concerns regarding medicines are outlined above.  No orders of the defined types were placed in this encounter.  No orders of the defined types were placed in this encounter.   There are no Patient Instructions on file for this visit.   Signed, Deberah Pelton, NP  06/14/2021 2:07 PM      Notice: This dictation was prepared with Dragon dictation along with smaller phrase technology. Any transcriptional errors that result from this process are unintentional and may not be corrected upon review.  I spent 12 minutes examining this patient, reviewing medications, and using patient centered shared decision making involving her cardiac care.  Prior to her visit I spent greater than 20 minutes reviewing her past medical history,  medications, and prior cardiac tests.

## 2021-06-13 DIAGNOSIS — Z20822 Contact with and (suspected) exposure to covid-19: Secondary | ICD-10-CM | POA: Diagnosis not present

## 2021-06-14 ENCOUNTER — Other Ambulatory Visit: Payer: Self-pay

## 2021-06-14 ENCOUNTER — Ambulatory Visit (INDEPENDENT_AMBULATORY_CARE_PROVIDER_SITE_OTHER): Payer: Medicare Other | Admitting: General Practice

## 2021-06-14 ENCOUNTER — Encounter (HOSPITAL_BASED_OUTPATIENT_CLINIC_OR_DEPARTMENT_OTHER): Payer: Self-pay | Admitting: General Practice

## 2021-06-14 VITALS — BP 122/78 | HR 71 | Ht 66.0 in | Wt 231.0 lb

## 2021-06-14 DIAGNOSIS — R0789 Other chest pain: Secondary | ICD-10-CM | POA: Diagnosis not present

## 2021-06-14 DIAGNOSIS — I480 Paroxysmal atrial fibrillation: Secondary | ICD-10-CM

## 2021-06-14 DIAGNOSIS — G4733 Obstructive sleep apnea (adult) (pediatric): Secondary | ICD-10-CM | POA: Diagnosis not present

## 2021-06-14 DIAGNOSIS — I10 Essential (primary) hypertension: Secondary | ICD-10-CM

## 2021-06-14 DIAGNOSIS — I5032 Chronic diastolic (congestive) heart failure: Secondary | ICD-10-CM

## 2021-06-14 NOTE — Patient Instructions (Addendum)
Medication Instructions:  Your physician recommends that you continue on your current medications as directed. Please refer to the Current Medication list given to you today.  *If you need a refill on your cardiac medications before your next appointment, please call your pharmacy*   Follow-Up: At [facility], you and your health needs are our priority.  As part of our continuing mission to provide you with exceptional heart care, we have created designated Provider Care Teams.  These Care Teams include your primary Cardiologist (physician) and Advanced Practice Providers (APPs -  Physician Assistants and Nurse Practitioners) who all work together to provide you with the care you need, when you need it.  We recommend signing up for the patient portal called "MyChart".  Sign up information is provided on this After Visit Summary.  MyChart is used to connect with patients for Virtual Visits (Telemedicine).  Patients are able to view lab/test results, encounter notes, upcoming appointments, etc.  Non-urgent messages can be sent to your provider as well.   To learn more about what you can do with MyChart, go to [email or web address].    Your next appointment:   6 month(s)---February 2023  The format for your next appointment:   In Person  Provider:   Sanda Klein, MD   Other Instructions  Coletta Memos, NP has recommended that you increase your physical activity as tolerated.  Heart Healthy Diet Recommendations: A low-salt diet is recommended. Meats should be grilled, baked, or boiled. Avoid fried foods. Focus on lean protein sources like fish or chicken with vegetables and fruits. The American Heart Association is a Microbiologist!  American Heart Association Diet and Lifeystyle Recommendations

## 2021-06-16 ENCOUNTER — Other Ambulatory Visit: Payer: Self-pay | Admitting: Cardiovascular Disease

## 2021-06-16 IMAGING — CT CT ABD-PELV W/ CM
2 of 5 series · 16 of 46 positions shown, 18 images · IV contrast (APPLIED)
Comparison: 05/10/2017

CLINICAL DATA: Anemia.  History of gastric ulcers.

EXAM:
CT ABDOMEN AND PELVIS WITH CONTRAST
TECHNIQUE: Multidetector CT imaging of the abdomen and pelvis was performed
using the standard protocol following bolus administration of
intravenous contrast.
CONTRAST:  100mL OMNIPAQUE IOHEXOL 300 MG/ML  SOLN

[Series 3: abdomen 5.0 (person_name) · axial · 0.98mm/px · z∈[+784,+1238]mm · 13 of 105 slices shown, 15 images]
[im 7/105  soft-tissue]
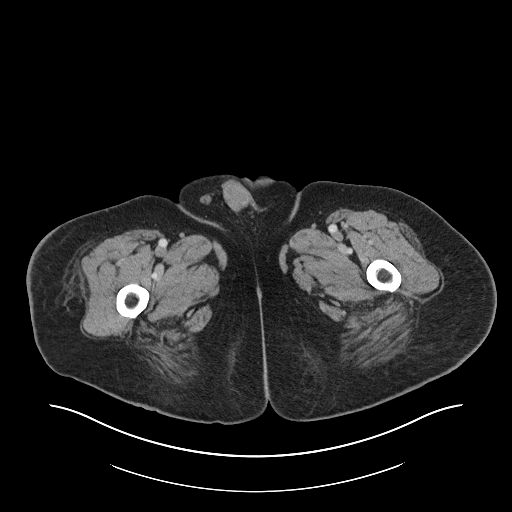
[im 7/105  bone]
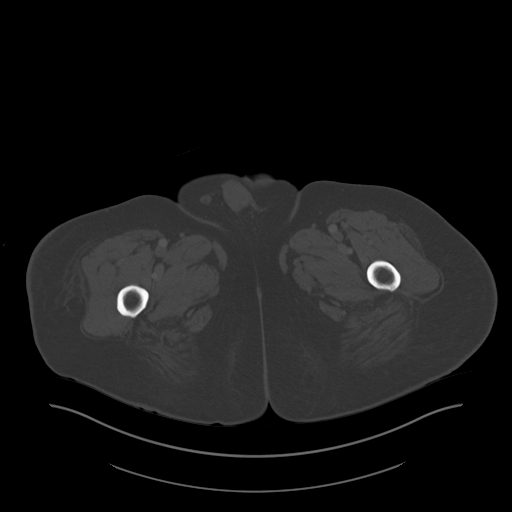
[im 14/105  soft-tissue]
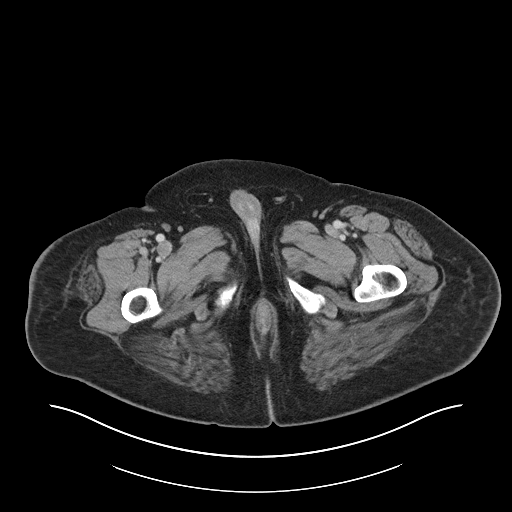
[im 21/105  soft-tissue]
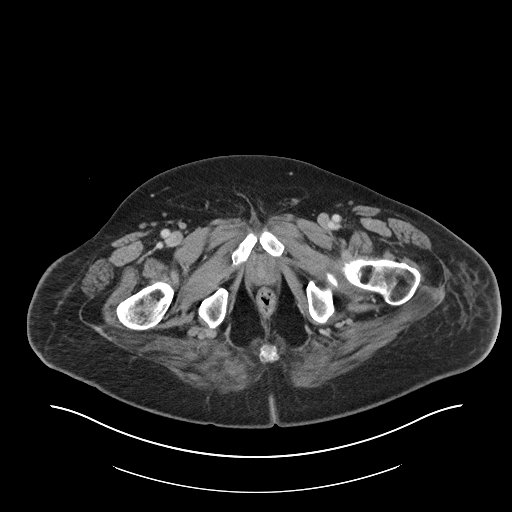
[im 28/105  soft-tissue]
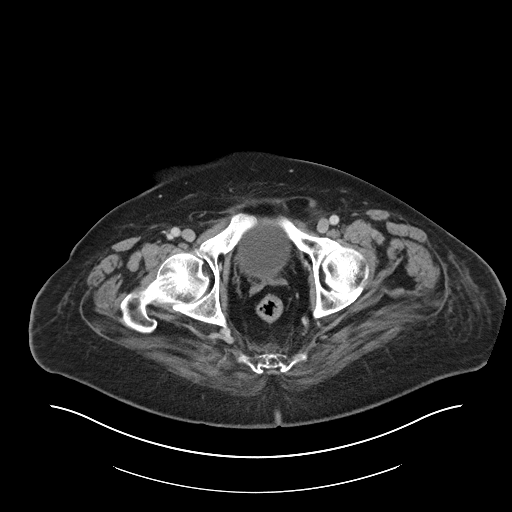
[im 35/105  soft-tissue]
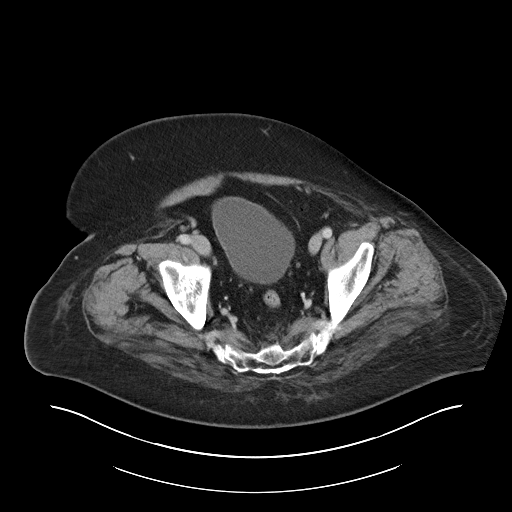
[im 42/105  soft-tissue]
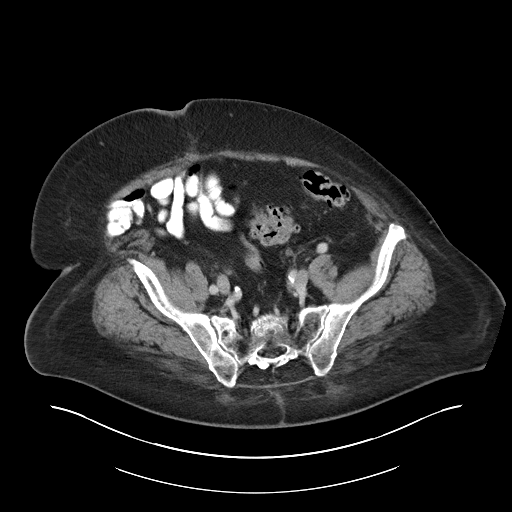
[im 56/105  soft-tissue]
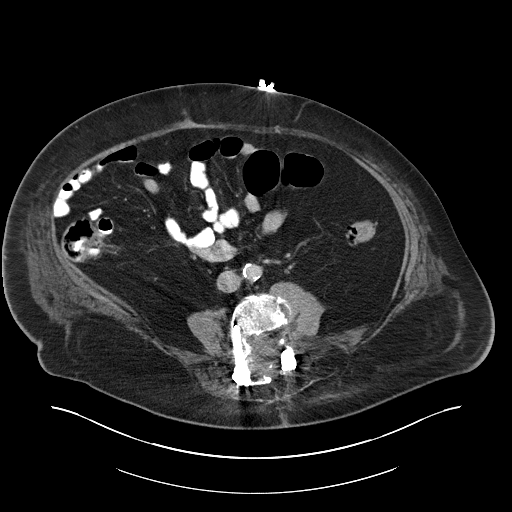
[im 63/105  soft-tissue]
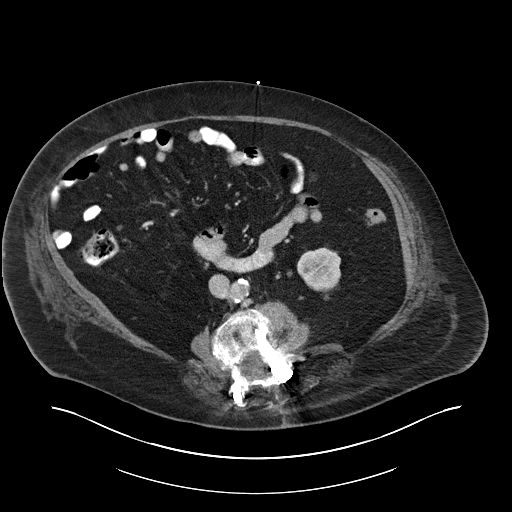
[im 70/105  soft-tissue]
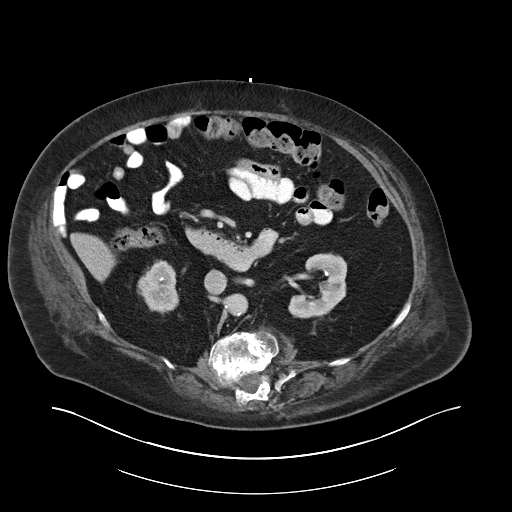
[im 70/105  bone]
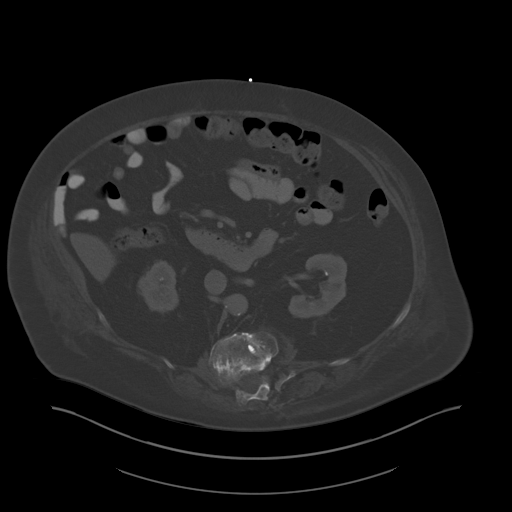
[im 77/105  soft-tissue]
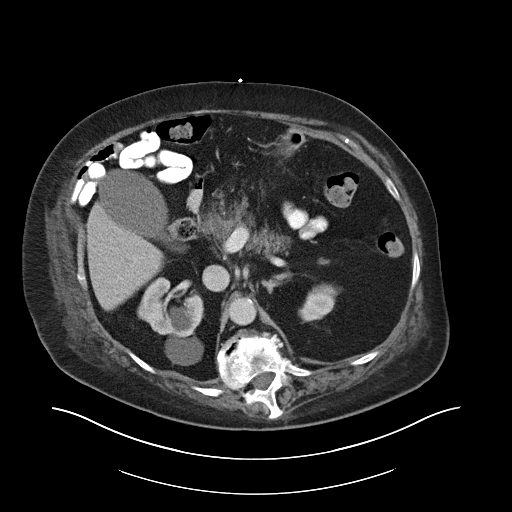
[im 84/105  soft-tissue]
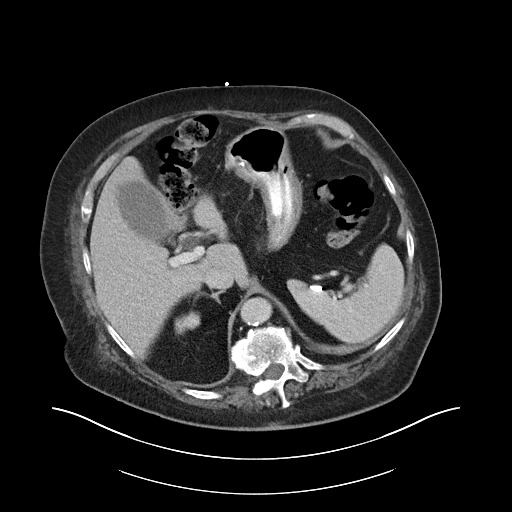
[im 91/105  soft-tissue]
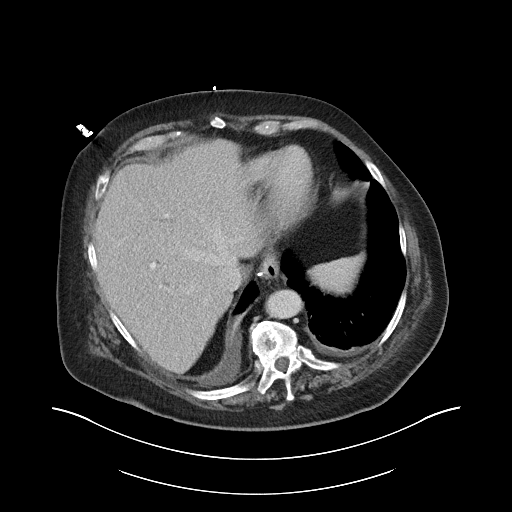
[im 98/105  soft-tissue]
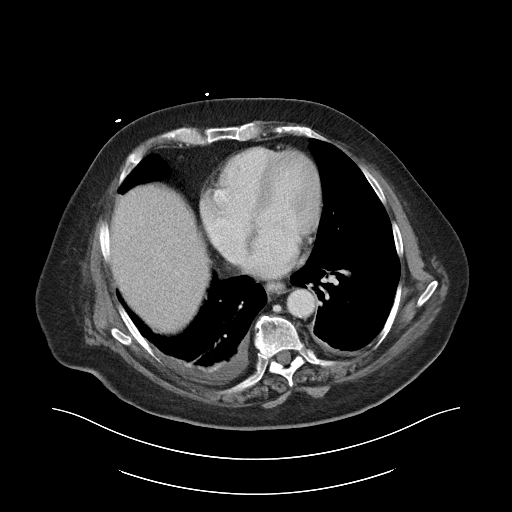

[Series 7: abdomen 3.0 (person_name) · coronal · 1.01mm/px · 3 of 133 slices shown]
[im 45/133  soft-tissue]
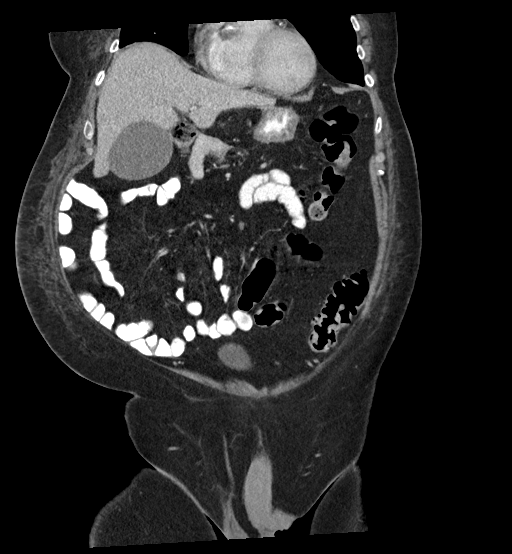
[im 59/133  soft-tissue]
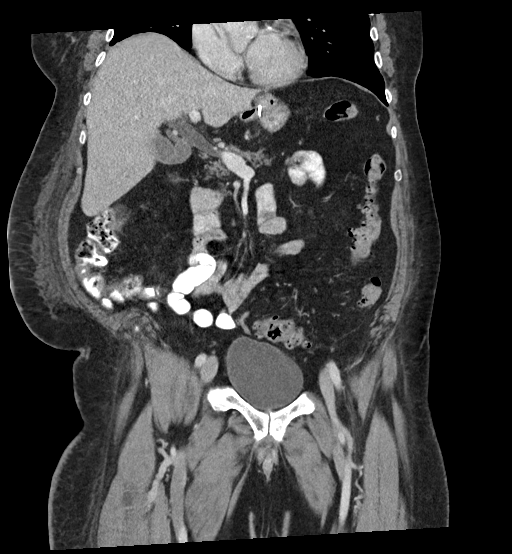
[im 74/133  soft-tissue]
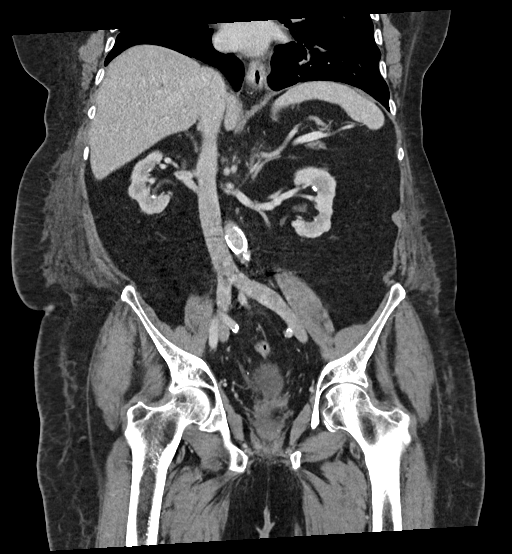

[16 of 46 positions shown; findings below may reference images not displayed]

FINDINGS: Lower chest: Coronary calcifications. New small pleural effusions,
right greater than left. Dependent atelectasis in the posterior
right lower lobe.

Hepatobiliary: No focal liver abnormality is seen. Gallbladder
unremarkable. CBD is mildly distended up to 12 mm diameter, seen
down to the ampulla without radiodense calculus. Portal vein patent.

Pancreas: Diffuse pancreatic atrophy without ductal dilatation.

Spleen: Normal in size without focal abnormality.

Adrenals/Urinary Tract: Adrenal glands unremarkable. Bilateral renal
cysts, stable since previous. No hydronephrosis. Urinary bladder
physiologically distended.

Stomach/Bowel: Postop changes of Billroth 1. Stomach and small bowel
are nondilated. Appendix surgically absent. The colon is nondilated,
with scattered descending and sigmoid diverticula; no adjacent
inflammatory change.

Vascular/Lymphatic: Aortoiliac calcified atheromatous plaque without
aneurysm or stenosis. Circumaortic left renal vein, an anatomic
variant. Portal vein patent. No abdominal or pelvic adenopathy.

Reproductive: Prostate is unremarkable.

Other: Bilateral pelvic phleboliths.  No ascites.  No free air.

Musculoskeletal: Thoracolumbar dextroscoliosis apex L2 multilevel
thoracolumbar spondylitic change. Previous instrumented spinal
fusion L1-S1. No fracture or worrisome bone lesion.
IMPRESSION: 1. No acute findings.
2. New small bilateral pleural effusions, right greater than left.
3. Coronary and  Aortic Atherosclerosis ([id number]).
4. Descending and sigmoid diverticulosis.

## 2021-07-01 ENCOUNTER — Other Ambulatory Visit: Payer: Self-pay | Admitting: Gastroenterology

## 2021-07-01 DIAGNOSIS — K2270 Barrett's esophagus without dysplasia: Secondary | ICD-10-CM

## 2021-07-01 DIAGNOSIS — D508 Other iron deficiency anemias: Secondary | ICD-10-CM

## 2021-07-06 ENCOUNTER — Telehealth: Payer: Self-pay | Admitting: Lab

## 2021-07-06 NOTE — Chronic Care Management (AMB) (Signed)
  Chronic Care Management   Note  07/06/2021 Name: Cody Frazier MRN: [id number] DOB: [birth year]  Cody Frazier is a 75 y.o. year old male who is a primary care patient of Plotnikov, Evie Lacks, MD. I reached out to [name] by phone today in response to a referral sent by [name]'s PCP, Plotnikov, Evie Lacks, MD.   [name] was given information about Chronic Care Management services today including:  CCM service includes personalized support from designated clinical staff supervised by his physician, including individualized plan of care and coordination with other care providers 24/7 contact phone numbers for assistance for urgent and routine care needs. Service will only be billed when office clinical staff spend 20 minutes or more in a month to coordinate care. Only one practitioner may furnish and bill the service in a calendar month. The patient may stop CCM services at any time (effective at the end of the month) by phone call to the office staff.   Patient agreed to services and verbal consent obtained.   Follow up plan:   [place]

## 2021-07-10 ENCOUNTER — Other Ambulatory Visit: Payer: Self-pay | Admitting: Internal Medicine

## 2021-07-13 ENCOUNTER — Encounter: Payer: Self-pay | Admitting: Internal Medicine

## 2021-07-13 ENCOUNTER — Ambulatory Visit (INDEPENDENT_AMBULATORY_CARE_PROVIDER_SITE_OTHER): Payer: Medicare Other | Admitting: Internal Medicine

## 2021-07-13 VITALS — BP 118/70 | HR 76 | Temp 98.6°F | Ht 66.0 in | Wt 236.8 lb

## 2021-07-13 DIAGNOSIS — Z6835 Body mass index (BMI) 35.0-35.9, adult: Secondary | ICD-10-CM

## 2021-07-13 DIAGNOSIS — R739 Hyperglycemia, unspecified: Secondary | ICD-10-CM

## 2021-07-13 DIAGNOSIS — I700 Atherosclerosis of aorta: Secondary | ICD-10-CM

## 2021-07-13 DIAGNOSIS — R609 Edema, unspecified: Secondary | ICD-10-CM

## 2021-07-13 DIAGNOSIS — I10 Essential (primary) hypertension: Secondary | ICD-10-CM | POA: Diagnosis not present

## 2021-07-13 DIAGNOSIS — M519 Unspecified thoracic, thoracolumbar and lumbosacral intervertebral disc disorder: Secondary | ICD-10-CM

## 2021-07-13 LAB — CBC WITH DIFFERENTIAL/PLATELET
Basophils Absolute: 0 10*3/uL (ref 0.0–0.1)
Basophils Relative: 0.5 % (ref 0.0–3.0)
Eosinophils Absolute: 0.1 10*3/uL (ref 0.0–0.7)
Eosinophils Relative: 2.3 % (ref 0.0–5.0)
HCT: 39.7 % (ref 39.0–52.0)
Hemoglobin: 13.1 g/dL (ref 13.0–17.0)
Lymphocytes Relative: 34 % (ref 12.0–46.0)
Lymphs Abs: 2.1 10*3/uL (ref 0.7–4.0)
MCHC: 33 g/dL (ref 30.0–36.0)
MCV: 95.1 fl (ref 78.0–100.0)
Monocytes Absolute: 0.6 10*3/uL (ref 0.1–1.0)
Monocytes Relative: 9.8 % (ref 3.0–12.0)
Neutro Abs: 3.3 10*3/uL (ref 1.4–7.7)
Neutrophils Relative %: 53.4 % (ref 43.0–77.0)
Platelets: 287 10*3/uL (ref 150.0–400.0)
RBC: 4.17 Mil/uL — ABNORMAL LOW (ref 4.22–5.81)
RDW: 12.8 % (ref 11.5–15.5)
WBC: 6.3 10*3/uL (ref 4.0–10.5)

## 2021-07-13 LAB — URINALYSIS, ROUTINE W REFLEX MICROSCOPIC
Bilirubin Urine: NEGATIVE
Ketones, ur: NEGATIVE
Leukocytes,Ua: NEGATIVE
Nitrite: NEGATIVE
Specific Gravity, Urine: 1.01 (ref 1.000–1.030)
Total Protein, Urine: NEGATIVE
Urine Glucose: NEGATIVE
Urobilinogen, UA: 0.2 (ref 0.0–1.0)
pH: 6 (ref 5.0–8.0)

## 2021-07-13 LAB — COMPREHENSIVE METABOLIC PANEL
ALT: 12 U/L (ref 0–53)
AST: 14 U/L (ref 0–37)
Albumin: 4.2 g/dL (ref 3.5–5.2)
Alkaline Phosphatase: 118 U/L — ABNORMAL HIGH (ref 39–117)
BUN: 27 mg/dL — ABNORMAL HIGH (ref 6–23)
CO2: 27 mEq/L (ref 19–32)
Calcium: 10.3 mg/dL (ref 8.4–10.5)
Chloride: 103 mEq/L (ref 96–112)
Creatinine, Ser: 1.33 mg/dL (ref 0.40–1.50)
GFR: 52.17 mL/min — ABNORMAL LOW (ref >60.00)
Glucose, Bld: 98 mg/dL (ref 70–99)
Potassium: 5.3 mEq/L — ABNORMAL HIGH (ref 3.5–5.1)
Sodium: 137 mEq/L (ref 135–145)
Total Bilirubin: 0.5 mg/dL (ref 0.2–1.2)
Total Protein: 7.3 g/dL (ref 6.0–8.3)

## 2021-07-13 LAB — TSH: TSH: 0.96 u[IU]/mL (ref 0.35–5.50)

## 2021-07-13 LAB — HEMOGLOBIN A1C: Hgb A1c MFr Bld: 5.7 % (ref 4.6–6.5)

## 2021-07-13 MED ORDER — OXYCODONE HCL 15 MG PO TABS: 15.0000 mg | ORAL_TABLET | Freq: Four times a day (QID) | ORAL | 0 refills | Status: DC

## 2021-07-13 NOTE — Assessment & Plan Note (Signed)
On Lasix NAS diet, loose wt

## 2021-07-13 NOTE — Progress Notes (Signed)
Subjective:  Patient ID: Cody Frazier, male    DOB: [birth year]  Age: 75 y.o. MRN: [id number]  CC: Follow-up (3 month f/u)   HPI Cody Frazier presents for chronic LBP, CAD, HTN, obesity  Outpatient Medications Prior to Visit  Medication Sig Dispense Refill   acetaminophen (TYLENOL) 500 MG tablet Take 1,000 mg by mouth every 6 (six) hours as needed for mild pain or moderate pain.     atorvastatin (LIPITOR) 20 MG tablet TAKE 1 TABLET BY MOUTH EVERY DAY 90 tablet 3   cetirizine (ZYRTEC) 10 MG tablet Take 10 mg by mouth daily.     ELIQUIS 2.5 MG TABS tablet TAKE 1 TABLET BY MOUTH TWICE A DAY 60 tablet 11   famotidine (PEPCID) 40 MG tablet Take 1 tablet (40 mg total) by mouth at bedtime. 30 tablet 11   fluticasone (FLONASE) 50 MCG/ACT nasal spray SPRAY 2 SPRAYS INTO EACH NOSTRIL EVERY DAY 48 mL 5   latanoprost (XALATAN) 0.005 % ophthalmic solution Place 1 drop into both eyes at bedtime.  11   metoprolol succinate (TOPROL-XL) 25 MG 24 hr tablet TAKE 1 TABLET BY MOUTH EVERY DAY 90 tablet 2   oxyCODONE (ROXICODONE) 15 MG immediate release tablet Take 1 tablet (15 mg total) by mouth 4 (four) times daily. 120 tablet 0   oxyCODONE (ROXICODONE) 15 MG immediate release tablet Take 1 tablet (15 mg total) by mouth 4 (four) times daily. 120 tablet 0   oxyCODONE (ROXICODONE) 15 MG immediate release tablet Take 1 tablet (15 mg total) by mouth 4 (four) times daily. 120 tablet 0   pantoprazole (PROTONIX) 40 MG tablet TAKE 1 TABLET BY MOUTH TWICE A DAY 180 tablet 0   polyethylene glycol (MIRALAX / GLYCOLAX) packet Take 17 g by mouth daily. 14 each 0   PRESCRIPTION MEDICATION Inhale into the lungs at bedtime. CPAP     spironolactone (ALDACTONE) 25 MG tablet TAKE 1 TABLET BY MOUTH EVERY DAY 90 tablet 3   telmisartan (MICARDIS) 20 MG tablet Take 1 tablet (20 mg total) by mouth daily. 90 tablet 3   timolol (TIMOPTIC) 0.25 % ophthalmic solution Place 1 drop into both eyes 2 (two) times daily.       furosemide (LASIX) 40 MG tablet Take 1 tablet (40 mg total) by mouth daily as needed. 30 tablet 2   Cholecalciferol (VITAMIN D3) 1.25 MG (50000 UT) CAPS Take 1 capsule by mouth every 30 (thirty) days. (Patient not taking: Reported on 07/13/2021) 3 capsule 3   triamcinolone cream (KENALOG) 0.5 % Apply 1 application topically 3 (three) times daily. (Patient not taking: Reported on 07/13/2021) 90 g 1   No facility-administered medications prior to visit.    ROS: Review of Systems  Constitutional:  Positive for unexpected weight change. Negative for appetite change and fatigue.  HENT:  Negative for congestion, nosebleeds, sneezing, sore throat and trouble swallowing.   Eyes:  Negative for itching and visual disturbance.  Respiratory:  Negative for cough.   Cardiovascular:  Negative for chest pain, palpitations and leg swelling.  Gastrointestinal:  Negative for abdominal distention, blood in stool, diarrhea and nausea.  Genitourinary:  Negative for frequency and hematuria.  Musculoskeletal:  Positive for arthralgias and back pain. Negative for gait problem, joint swelling and neck pain.  Skin:  Negative for rash.  Neurological:  Negative for dizziness, tremors, speech difficulty and weakness.  Psychiatric/Behavioral:  Negative for agitation, dysphoric mood and sleep disturbance. The patient is not nervous/anxious.    Objective:  BP 118/70 (BP Location: Left Arm)   Pulse 76   Temp 98.6 F (37 C) (Oral)   Ht '5\' 6"'$  (1.676 m)   Wt 236 lb 12.8 oz (107.4 kg)   SpO2 95%   BMI 38.22 kg/m   BP Readings from Last 3 Encounters:  07/13/21 118/70  06/14/21 122/78  04/12/21 124/78    Wt Readings from Last 3 Encounters:  07/13/21 236 lb 12.8 oz (107.4 kg)  06/14/21 231 lb (104.8 kg)  04/12/21 230 lb 6.4 oz (104.5 kg)    Physical Exam Constitutional:      General: He is not in acute distress.    Appearance: He is well-developed. He is obese.     Comments: NAD  Eyes:     Conjunctiva/sclera:  Conjunctivae normal.     Pupils: Pupils are equal, round, and reactive to light.  Neck:     Thyroid: No thyromegaly.     Vascular: No JVD.  Cardiovascular:     Rate and Rhythm: Normal rate and regular rhythm.     Heart sounds: Normal heart sounds. No murmur heard.   No friction rub. No gallop.  Pulmonary:     Effort: Pulmonary effort is normal. No respiratory distress.     Breath sounds: Normal breath sounds. No wheezing or rales.  Chest:     Chest wall: No tenderness.  Abdominal:     General: Bowel sounds are normal. There is no distension.     Palpations: Abdomen is soft. There is no mass.     Tenderness: There is no abdominal tenderness. There is no guarding or rebound.  Musculoskeletal:        General: Tenderness present. Normal range of motion.     Cervical back: Normal range of motion.  Lymphadenopathy:     Cervical: No cervical adenopathy.  Skin:    General: Skin is warm and dry.     Findings: No rash.  Neurological:     Mental Status: He is alert and oriented to person, place, and time.     Cranial Nerves: No cranial nerve deficit.     Motor: No weakness or abnormal muscle tone.     Coordination: Coordination normal.     Gait: Gait abnormal.     Deep Tendon Reflexes: Reflexes are normal and symmetric.  Psychiatric:        Behavior: Behavior normal.        Thought Content: Thought content normal.        Judgment: Judgment normal.  LS spine is tender Antalgic gait - using a cane  Lab Results  Component Value Date   WBC 6.5 03/24/2021   HGB 13.9 03/24/2021   HCT 41.5 03/24/2021   PLT 298 03/24/2021   GLUCOSE 136 (H) 03/24/2021   CHOL 196 11/20/2018   TRIG 269.0 (H) 11/20/2018   HDL 43.60 11/20/2018   LDLDIRECT 109.0 11/20/2018   LDLCALC 86 11/25/2017   ALT 7 08/13/2020   AST 28 08/13/2020   NA 140 03/24/2021   K 5.2 03/24/2021   CL 102 03/24/2021   CREATININE 1.21 03/24/2021   BUN 18 03/24/2021   CO2 21 03/24/2021   TSH 1.04 05/23/2020   PSA 0.16  11/20/2018   INR 1.1 08/12/2020   HGBA1C 5.5 05/23/2020    DG Chest 2 View  Result Date: 08/13/2020 CLINICAL DATA:  Iron deficiency, anemia. EXAM: CHEST - 2 VIEW COMPARISON:  05/19/2018 FINDINGS: Low lung volumes. Mild diffuse interstitial prominence. No confluent airspace disease. Heart size  upper limits normal for technique. Aortic Atherosclerosis (ICD10-170.0). Blunting of posterior costophrenic angles suggesting small effusions. No pneumothorax. Spondylitic changes near the thoracolumbar junction. IMPRESSION: Low volumes with small pleural effusions. Electronically Signed   By: Lucrezia Europe M.D.   On: 08/13/2020 14:14   CT ABDOMEN PELVIS W CONTRAST  Result Date: 08/13/2020 CLINICAL DATA:  Anemia.  History of gastric ulcers. EXAM: CT ABDOMEN AND PELVIS WITH CONTRAST TECHNIQUE: Multidetector CT imaging of the abdomen and pelvis was performed using the standard protocol following bolus administration of intravenous contrast. CONTRAST:  120m OMNIPAQUE IOHEXOL 300 MG/ML  SOLN COMPARISON:  05/10/2017 FINDINGS: Lower chest: Coronary calcifications. New small pleural effusions, right greater than left. Dependent atelectasis in the posterior right lower lobe. Hepatobiliary: No focal liver abnormality is seen. Gallbladder unremarkable. CBD is mildly distended up to 12 mm diameter, seen down to the ampulla without radiodense calculus. Portal vein patent. Pancreas: Diffuse pancreatic atrophy without ductal dilatation. Spleen: Normal in size without focal abnormality. Adrenals/Urinary Tract: Adrenal glands unremarkable. Bilateral renal cysts, stable since previous. No hydronephrosis. Urinary bladder physiologically distended. Stomach/Bowel: Postop changes of [place] 1. Stomach and small bowel are nondilated. Appendix surgically absent. The colon is nondilated, with scattered descending and sigmoid diverticula; no adjacent inflammatory change. Vascular/Lymphatic: Aortoiliac calcified atheromatous plaque without  aneurysm or stenosis. Circumaortic left renal vein, an anatomic variant. Portal vein patent. No abdominal or pelvic adenopathy. Reproductive: Prostate is unremarkable. Other: Bilateral pelvic phleboliths.  No ascites.  No free air. Musculoskeletal: Thoracolumbar dextroscoliosis apex L2 multilevel thoracolumbar spondylitic change. Previous instrumented spinal fusion L1-S1. No fracture or worrisome bone lesion. IMPRESSION: 1. No acute findings. 2. New small bilateral pleural effusions, right greater than left. 3. Coronary and  Aortic Atherosclerosis (ICD10-I70.0). 4. Descending and sigmoid diverticulosis. Electronically Signed   By: DLucrezia EuropeM.D.   On: 08/13/2020 14:12    Assessment & Plan:     AWalker Kehr MD

## 2021-07-13 NOTE — Assessment & Plan Note (Signed)
On Lipitor 

## 2021-07-13 NOTE — Assessment & Plan Note (Signed)
Continue with oxycodone 15 mg 4 times a day as needed  Potential benefits of a long term opioids use as well as potential risks (i.e. addiction risk, apnea etc) and complications (i.e. Somnolence, constipation and others) were explained to the patient and were aknowledged.

## 2021-07-13 NOTE — Addendum Note (Signed)
Addended by: Trenda Moots on: [id number] 03:56 PM   Modules accepted: Orders

## 2021-07-13 NOTE — Assessment & Plan Note (Signed)
NAS, wt loss discussed On Telmisartan, Lasix

## 2021-07-13 NOTE — Assessment & Plan Note (Signed)
Diet discussed 

## 2021-07-14 ENCOUNTER — Encounter: Payer: Self-pay | Admitting: Internal Medicine

## 2021-07-14 DIAGNOSIS — N182 Chronic kidney disease, stage 2 (mild): Secondary | ICD-10-CM | POA: Insufficient documentation

## 2021-07-18 DIAGNOSIS — H401131 Primary open-angle glaucoma, bilateral, mild stage: Secondary | ICD-10-CM | POA: Diagnosis not present

## 2021-08-03 DIAGNOSIS — Z23 Encounter for immunization: Secondary | ICD-10-CM | POA: Diagnosis not present

## 2021-08-15 ENCOUNTER — Telehealth: Payer: Self-pay

## 2021-08-15 NOTE — Chronic Care Management (AMB) (Signed)
Chronic Care Management Pharmacy Assistant   Name: Cody Frazier  MRN: [id number] DOB: Oct 20, 1946  Cody Frazier is an 75 y.o. year old male who presents for his initial CCM visit with the clinical pharmacist.  Recent office visits:  07/13/21-Aleksei V. Plotnikov, MD (PCP) 3 month follow up visit. Labs ordered. Follow up in 3 months. 04/12/21-Aleksei V. Plotnikov, MD (PCP) 3 month follow up visit. Follow up in 3 months.  Recent consult visits:  06/14/21-Delvon M. Marilynn Rail, NP (Cardiology) Evaluation of chest pain. Follow up in 4 months.  Hospital visits:  None in previous 6 months  Medications: Outpatient Encounter Medications as of 08/15/2021  Medication Sig   acetaminophen (TYLENOL) 500 MG tablet Take 1,000 mg by mouth every 6 (six) hours as needed for mild pain or moderate pain.   atorvastatin (LIPITOR) 20 MG tablet TAKE 1 TABLET BY MOUTH EVERY DAY   cetirizine (ZYRTEC) 10 MG tablet Take 10 mg by mouth daily.   ELIQUIS 2.5 MG TABS tablet TAKE 1 TABLET BY MOUTH TWICE A DAY   famotidine (PEPCID) 40 MG tablet Take 1 tablet (40 mg total) by mouth at bedtime.   fluticasone (FLONASE) 50 MCG/ACT nasal spray SPRAY 2 SPRAYS INTO EACH NOSTRIL EVERY DAY   furosemide (LASIX) 40 MG tablet Take 1 tablet (40 mg total) by mouth daily as needed.   latanoprost (XALATAN) 0.005 % ophthalmic solution Place 1 drop into both eyes at bedtime.   metoprolol succinate (TOPROL-XL) 25 MG 24 hr tablet TAKE 1 TABLET BY MOUTH EVERY DAY   oxyCODONE (ROXICODONE) 15 MG immediate release tablet Take 1 tablet (15 mg total) by mouth 4 (four) times daily.   oxyCODONE (ROXICODONE) 15 MG immediate release tablet Take 1 tablet (15 mg total) by mouth 4 (four) times daily.   oxyCODONE (ROXICODONE) 15 MG immediate release tablet Take 1 tablet (15 mg total) by mouth 4 (four) times daily.   pantoprazole (PROTONIX) 40 MG tablet TAKE 1 TABLET BY MOUTH TWICE A DAY   polyethylene glycol (MIRALAX / GLYCOLAX) packet Take 17 g  by mouth daily.   PRESCRIPTION MEDICATION Inhale into the lungs at bedtime. CPAP   spironolactone (ALDACTONE) 25 MG tablet TAKE 1 TABLET BY MOUTH EVERY DAY   telmisartan (MICARDIS) 20 MG tablet Take 1 tablet (20 mg total) by mouth daily.   timolol (TIMOPTIC) 0.25 % ophthalmic solution Place 1 drop into both eyes 2 (two) times daily.    No facility-administered encounter medications on file as of 08/15/2021.   Acetaminophen (TYLENOL) 500 MG tablet Last filled:None noted Atorvastatin (LIPITOR) 20 MG tablet Last filled:07/10/21 90 DS Cetirizine (ZYRTEC) 10 MG tablet Last filled:None noted ELIQUIS 2.5 MG TABS tablet Last filled:01/20/19 30 DS Famotidine (PEPCID) 40 MG tablet Last filled:09/08/20 90 DS Fluticasone (FLONASE) 50 MCG/ACT nasal spray Last filled:05/20/21 90 DS Furosemide (LASIX) 40 MG tablet (Expired) Last filled:01/03/21 30 DS Latanoprost (XALATAN) 0.005 % ophthalmic solution Last filled:08/07/21 90 DS Metoprolol succinate (TOPROL-XL) 25 MG 24 hr tablet Last filled:06/16/21 90 DS OxyCODONE (ROXICODONE) 15 MG immediate release tablet Last filled:07/22/21 3 DS Pantoprazole (PROTONIX) 40 MG tablet Last filled:07/03/21 90 DS Polyethylene glycol (MIRALAX / GLYCOLAX) packet Last filled:None noted Spironolactone (ALDACTONE) 25 MG tablet Last filled:05/30/21 90 DS Telmisartan (MICARDIS) 20 MG tablet Last filled:06/19/21 90 DS Timolol (TIMOPTIC) 0.25 % ophthalmic solution Last filled:08/07/21 90 DS   Care Gaps: Hepatitis C Screening:Never done Zoster Vaccines- Shingrix:Never done COVID-19 Vaccine:Last completed: Aug 28, 2020 INFLUENZA VACCINE:Last completed: Aug 14, 2020  Star  Rating Drugs: Telmisartan (MICARDIS) 20 MG tablet Last filled:06/19/21 90 DS Atorvastatin (LIPITOR) 20 MG tablet Last filled:07/10/21 90 DS  Myriam Elta [place], [place]

## 2021-08-16 ENCOUNTER — Other Ambulatory Visit: Payer: Self-pay

## 2021-08-16 ENCOUNTER — Ambulatory Visit (INDEPENDENT_AMBULATORY_CARE_PROVIDER_SITE_OTHER): Payer: Medicare Other

## 2021-08-16 DIAGNOSIS — E785 Hyperlipidemia, unspecified: Secondary | ICD-10-CM

## 2021-08-16 DIAGNOSIS — M519 Unspecified thoracic, thoracolumbar and lumbosacral intervertebral disc disorder: Secondary | ICD-10-CM

## 2021-08-16 DIAGNOSIS — I10 Essential (primary) hypertension: Secondary | ICD-10-CM

## 2021-08-16 DIAGNOSIS — I480 Paroxysmal atrial fibrillation: Secondary | ICD-10-CM

## 2021-08-16 DIAGNOSIS — K219 Gastro-esophageal reflux disease without esophagitis: Secondary | ICD-10-CM

## 2021-08-16 NOTE — Progress Notes (Addendum)
Chronic Care Management Pharmacy Note  08/16/2021 Name:  Cody Frazier MRN:  [id number] DOB:  Jan 11, 1946  Summary: - Patient reports that he is doing well overall, has been checking BP daily - reports that typically BP averaging 120/70's at times can be higher/ lower - this AM was slightly higher, denies changes in diet or stress/pain levels -Potassium slightly elevated on most recent lab - notes to eating at least 1 banana daily along with serving of potatoes - unsure of other possible sources of potassium intake in his diet  -reports pain is manageable, uses oxycodone and APAP along with positioning and ice pack  -reports to no issues with current medications   Recommendations/Changes made from today's visit: -Recommending for patient to continue monitoring blood pressure once daily, advised for patient to start taking furosemide once daily for next 3-5 days, then return to taking prn - advised for patient to reduce potassium intake as well to help bring back into range - possibly in future patient may benefit from d/c of spironolactone and using furosemide daily  -Recommended for patient to limit APAP use to no more than 3085m daily due to concerns for liver damage if taking 4g daily chronically  Subjective: Cody BOOMERis an 75y.o. year old male who is a primary patient of Frazier, AEvie Lacks MD.  The CCM team was consulted for assistance with disease management and care coordination needs.    Engaged with patient face to face for initial visit in response to provider referral for pharmacy case management and/or care coordination services.   Consent to Services:  The patient was given the following information about Chronic Care Management services today, agreed to services, and gave verbal consent: 1. CCM service includes personalized support from designated clinical staff supervised by the primary care provider, including individualized plan of care and coordination with other  care providers 2. 24/7 contact phone numbers for assistance for urgent and routine care needs. 3. Service will only be billed when office clinical staff spend 20 minutes or more in a month to coordinate care. 4. Only one practitioner may furnish and bill the service in a calendar month. 5.The patient may stop CCM services at any time (effective at the end of the month) by phone call to the office staff. 6. The patient will be responsible for cost sharing (co-pay) of up to 20% of the service fee (after annual deductible is met). Patient agreed to services and consent obtained.  Patient Care Team: Frazier, AEvie Lacks MD as PCP - General Croitoru, MDani Gobble MD as PCP - Cardiology (Cardiology) BLeeroy Cha MD (Neurosurgery) GMichael Boston MD (General Surgery) SLadene Artist MD (Gastroenterology) DFranchot Gallo MD as Attending Physician (Urology) TJacolyn Reedy MD as Consulting Physician (Cardiology) AV[facility] Consult[facility] Physician (Ophthalmology) SDelice BisonDDarnelle Maffucci [facility] Pharmacist (Pharmacist)  Recent office visits:  07/13/21-Cody V. Plotnikov, MD (PCP) 3 month follow up visit. Labs ordered. Follow up in 3 months. 04/12/21-Cody V. Plotnikov, MD (PCP) 3 month follow up visit. Follow up in 3 months.   Recent consult visits:  06/14/21-Cody M. CMarilynn Rail NP (Cardiology) Evaluation of chest pain. Follow up in 4 months.   Hospital visits:  None in previous 6 months  Objective:  Lab Results  Component Value Date   CREATININE 1.33 07/13/2021   BUN 27 (H) 07/13/2021   GFR 52.17 (L) 07/13/2021   GFRNONAA 47 (L) 10/18/2020   GFRAA 55 (L) 10/18/2020   NA 137 07/13/2021  K 5.3 No hemolysis seen (H) 07/13/2021   CALCIUM 10.3 07/13/2021   CO2 27 07/13/2021   GLUCOSE 98 07/13/2021    Lab Results  Component Value Date/Time   HGBA1C 5.7 07/13/2021 03:56 PM   HGBA1C 5.5 05/23/2020 02:07 PM   GFR 52.17 (L) 07/13/2021 03:56 PM   GFR 55.95 (L) 08/26/2019 10:58  AM    Last diabetic Eye exam:  No results found for: HMDIABEYEEXA  Last diabetic Foot exam:  No results found for: HMDIABFOOTEX   Lab Results  Component Value Date   CHOL 196 11/20/2018   HDL 43.60 11/20/2018   LDLCALC 86 11/25/2017   LDLDIRECT 109.0 11/20/2018   TRIG 269.0 (H) 11/20/2018   CHOLHDL 4 11/20/2018    Hepatic Function Latest Ref Rng & Units 07/13/2021 08/13/2020 08/12/2020  Total Protein 6.0 - 8.3 g/dL 7.3 5.4(L) 5.8(L)  Albumin 3.5 - 5.2 g/dL 4.2 2.8(L) 3.0(L)  AST 0 - 37 U/L _0 ALT 0 - 53 U/L _1 Alk Phosphatase 39 - 117 U/L 118(H) 69 84  Total Bilirubin 0.2 - 1.2 mg/dL 0.5 0.9 0.6  Bilirubin, Direct 0.0 - 0.2 mg/dL - - -    Lab Results  Component Value Date/Time   TSH 0.96 07/13/2021 03:56 PM   TSH 1.04 05/23/2020 02:07 PM    CBC Latest Ref Rng & Units 07/13/2021 03/24/2021 09/20/2020  WBC 4.0 - 10.5 K/uL 6.3 6.5 7.8  Hemoglobin 13.0 - 17.0 g/dL 13.1 13.9 13.0  Hematocrit 39.0 - 52.0 % 39.7 41.5 40.1  Platelets 150.0 - 400.0 K/uL 287.0 298 317    No results found for: VD25OH  Clinical ASCVD: No  The 10-year ASCVD risk score (Arnett DK, et al., 2019) is: 44%   Values used to calculate the score:     Age: 75 years     Sex: Male     Is Non-Hispanic African American: No     Diabetic: Yes     Tobacco smoker: No     Systolic Blood Pressure: 003 mmHg     Is BP treated: Yes     HDL Cholesterol: 43.6 mg/dL     Total Cholesterol: 196 mg/dL    Depression screen [facility] 2/9 12/21/2020 09/12/2020 02/22/2020  Decreased Interest 0 0 0  Down, Depressed, Hopeless 0 0 0  PHQ - 2 Score 0 0 0  Altered sleeping - - -  Tired, decreased energy - - -  Change in appetite - - -  Feeling bad or failure about yourself  - - -  Trouble concentrating - - -  Moving slowly or fidgety/restless - - -  Suicidal thoughts - - -  PHQ-9 Score - - -  Some recent data might be hidden    Social History   Tobacco Use  Smoking Status Former  Smokeless Tobacco Never  Tobacco  Comments   as a teenager   BP Readings from Last 3 Encounters:  07/13/21 118/70  06/14/21 122/78  04/12/21 124/78   Pulse Readings from Last 3 Encounters:  07/13/21 76  06/14/21 71  04/12/21 84   Wt Readings from Last 3 Encounters:  07/13/21 236 lb 12.8 oz (107.4 kg)  06/14/21 231 lb (104.8 kg)  04/12/21 230 lb 6.4 oz (104.5 kg)   BMI Readings from Last 3 Encounters:  07/13/21 38.22 kg/m  06/14/21 37.28 kg/m  04/12/21 37.76 kg/m    Assessment/Interventions: Review of patient past medical history, allergies, medications, health status, including review of consultants  reports, laboratory and other test data, was performed as part of comprehensive evaluation and provision of chronic care management services.   SDOH:  (Social Determinants of Health) assessments and interventions performed: Yes  SDOH Screenings   Alcohol Screen: Low Risk    Last Alcohol Screening Score (AUDIT): 0  Depression (PHQ2-9): Low Risk    PHQ-2 Score: 0  Financial Resource Strain: Low Risk    Difficulty of Paying Living Expenses: Not hard at all  Food Insecurity: No Food Insecurity   Worried About Charity fundraiser in the Last Year: Never true   Ran Out of Food in the Last Year: Never true  Housing: Low Risk    Last Housing Risk Score: 0  Physical Activity: Inactive   Days of Exercise per Week: 0 days   Minutes of Exercise per Session: 0 min  Social Connections: Engineer, building services of Communication with Friends and Family: More than three times a week   Frequency of Social Gatherings with Friends and Family: More than three times a week   Attends Religious Services: More than 4 times per year   Active Member of [employer] or Organizations: Yes   Attends Music therapist: More than 4 times per year   Marital Status: Married  Stress: No Stress Concern Present   Feeling of Stress : Not at all  Tobacco Use: Medium Risk   Smoking Tobacco Use: Former   Smokeless Tobacco  Use: Never  Transportation Needs: No Data processing manager (Medical): No   Lack of Transportation (Non-Medical): No    CCM Care Plan  Allergies  Allergen Reactions   Codeine Phosphate Swelling   Nsaids Other (See Comments)    ulcers   Adhesive [Tape] Rash    ekg strips    Medications Reviewed Today     Reviewed by Tomasa Blase, [facility] (Pharmacist) on 08/16/21 at 1510  Med List Status: <None>   Medication Order Taking? Sig Documenting Provider Last Dose Status Informant  acetaminophen (TYLENOL) 500 MG tablet [id number] Yes Take 1,000 mg by mouth every 6 (six) hours as needed for mild pain or moderate pain. Maximum daily dose of 3g [provider] Taking Active Multiple Informants  atorvastatin (LIPITOR) 20 MG tablet [id number] Yes TAKE 1 TABLET BY MOUTH EVERY DAY Frazier, Evie Lacks, MD Taking Active   cetirizine (ZYRTEC) 10 MG tablet [id number] Yes Take 10 mg by mouth daily. [provider] Taking Active Multiple Informants  ELIQUIS 2.5 MG TABS tablet [id number] Yes TAKE 1 TABLET BY MOUTH TWICE A DAY Frazier, Evie Lacks, MD Taking Active   famotidine (PEPCID) 40 MG tablet [id number] Yes Take 1 tablet (40 mg total) by mouth at bedtime. Ladene Artist, MD Taking Active Multiple Informants  fluticasone [facility]) 50 MCG/ACT nasal spray [id number] Yes SPRAY 2 SPRAYS INTO EACH NOSTRIL EVERY DAY Frazier, Evie Lacks, MD Taking Active   furosemide (LASIX) 40 MG tablet [id number]  Take 1 tablet (40 mg total) by mouth daily as needed. Croitoru, Mihai, MD  Expired 03/14/21 2359   latanoprost (XALATAN) 0.005 % ophthalmic solution [id number] Yes Place 1 drop into both eyes at bedtime. [provider] Taking Active Multiple Informants  metoprolol succinate (TOPROL-XL) 25 MG 24 hr tablet [id number] Yes TAKE 1 TABLET BY MOUTH EVERY DAY Cleaver, Jossie Ng, NP Taking Active   oxyCODONE (ROXICODONE) 15 MG immediate release tablet [id number] Yes Take 1 tablet  (15 mg total) by mouth 4 (four) times daily. Frazier,  Evie Lacks, MD Taking Active   oxyCODONE (ROXICODONE) 15 MG immediate release tablet [id number]  Take 1 tablet (15 mg total) by mouth 4 (four) times daily. Frazier, Evie Lacks, MD  Active   oxyCODONE (ROXICODONE) 15 MG immediate release tablet [id number]  Take 1 tablet (15 mg total) by mouth 4 (four) times daily. Frazier, Evie Lacks, MD  Active   pantoprazole (PROTONIX) 40 MG tablet [id number] Yes TAKE 1 TABLET BY MOUTH TWICE A DAY Ladene Artist, MD Taking Active   polyethylene glycol [facility] / GLYCOLAX) packet [id number] Yes Take 17 g by mouth daily. Dessa Phi, DO Taking Active Multiple Informants  PRESCRIPTION MEDICATION [id number] Yes Inhale into the lungs at bedtime. CPAP [provider] Taking Active   spironolactone (ALDACTONE) 25 MG tablet [id number] Yes TAKE 1 TABLET BY MOUTH EVERY DAY Croitoru, Mihai, MD Taking Active   telmisartan (MICARDIS) 20 MG tablet [id number] Yes Take 1 tablet (20 mg total) by mouth daily. Croitoru, Mihai, MD Taking Active   timolol (TIMOPTIC) 0.25 % ophthalmic solution [id number] Yes Place 1 drop into both eyes 2 (two) times daily.  [provider] Taking Active Multiple Informants            Patient Active Problem List   Diagnosis Date Noted   CRI (chronic renal insufficiency), stage 2 (mild) 07/14/2021   Hearing loss 09/12/2020   Memory difficulties 09/12/2020   Symptomatic anemia 08/12/2020   Aortic valve stenosis, nonrheumatic 08/10/2020   PAH (pulmonary artery hypertension) ([place]) 08/10/2020   Long term (current) use of anticoagulants 08/10/2020   Essential hypertension 08/10/2020   Vitamin D deficiency 09/08/2019   Edema 06/20/2019   Foot deformity 06/17/2019   OSA on CPAP 05/27/2019   Wrist fracture, closed 02/24/2019   Pulmonary hypertension ([place]) 01/01/2019   Obesity 05/19/2018   Paroxysmal atrial fibrillation ([place]) 05/15/2017   Atherosclerosis of aorta ([place])  05/15/2017   Pressure injury of skin 05/15/2017   Internal hernia 05/10/2017   DOE (dyspnea on exertion) 06/12/2016   Warts 05/26/2015   Neoplasm of uncertain behavior of skin 03/01/2013   History of nephrolithiasis 08/19/2012   Rash 08/13/2011   Cerumen impaction 02/09/2011   Actinic keratosis 02/09/2011   Anxiety state 10/20/2010   SBO (small bowel obstruction) ([place]) 01/14/2009   Upper gastrointestinal bleeding 08/12/2008   History of upper gastrointestinal bleeding 08/12/2008   GERD 09/11/2007   Dyslipidemia 06/09/2007   Iron deficiency anemia 06/09/2007   Hypertensive heart disease without CHF 06/09/2007   Lumbar disc disease 06/09/2007    Immunization History  Administered Date(s) Administered   DT (Pediatric) 02/09/2011   Fluad Quad(high Dose 65+) 08/14/2020   Influenza Split 08/13/2011, 08/19/2012   Influenza Whole 09/26/2009, 08/01/2010   Influenza, High Dose Seasonal PF 08/25/2013, 10/04/2015, 08/22/2016, 08/23/2017, 08/19/2018, 07/27/2019   Influenza,inj,Quad PF,6+ Mos 08/25/2014   PFIZER(Purple Top)SARS-COV-2 Vaccination 12/09/2019, 12/30/2019, 08/28/2020   Pneumococcal Conjugate-13 11/22/2014   Pneumococcal Polysaccharide-23 11/19/2011   Td 05/26/2015   Tdap 12/26/2018   Zoster, Live 02/17/2008    Conditions to be addressed/monitored:  Hypertension, Hyperlipidemia, Atrial Fibrillation, Coronary Artery Disease, GERD, Allergic Rhinitis, and Chronic Pain (back)  Care Plan : CCM Care Plan  Updates made by Tomasa Blase, RPH since 08/16/2021 12:00 AM     Problem: Hypertension, Hyperlipidemia, Atrial Fibrillation, Coronary Artery Disease, GERD, Allergic Rhinitis, and Chronic Pain (back)   Priority: High  Onset Date: 08/16/2021     Long-Range Goal: Disease Management   Start Date: 08/16/2021  Expected End Date: 02/14/2022  This Visit's Progress: On track  Priority: High  Note:   Current Barriers:  Unable to independently monitor therapeutic  efficacy  Pharmacist Clinical Goal(s):  Patient will achieve adherence to monitoring guidelines and medication adherence to achieve therapeutic efficacy maintain control of BP, LDL, and Pain as evidenced by BP logs, next lipid panel, pain levels throughout the day  through collaboration with PharmD and provider.   Interventions: 1:1 collaboration with Frazier, Evie Lacks, MD regarding development and update of comprehensive plan of care as evidenced by provider attestation and co-signature Inter-disciplinary care team collaboration (see longitudinal plan of care) Comprehensive medication review performed; medication list updated in electronic medical record  Hypertension (BP goal <140/90) -Controlled -Current treatment: Metoprolol Succinate 27m - 1 tablet daily  Spironolactone 275m- 1 tablet daily  Furosemide 4058m 1 tablet daily as needed   Telmisartan 94m46m1 tablet daily  -Medications previously tried: diltiazem, losartan, valsartan  -Current home readings: averaging 120's/70's - this AM was 164/91 HR was 81 - at times can be elevated without cause  - can occasionally drop to 90's/65's -Current dietary habits: notes that he has been eating more salt recently than he usually does - does not drink coffee or tea  -Current exercise habits: very limited due to chronic pain in his back  -Denies hypotensive/hypertensive symptoms -Educated on BP goals and benefits of medications for prevention of heart attack, stroke and kidney damage; Daily salt intake goal < 2300 mg; Exercise goal of 150 minutes per week; Importance of home blood pressure monitoring; Proper BP monitoring technique; Symptoms of hypotension and importance of maintaining adequate hydration; -Counseled to monitor BP at home daily, document, and provide log at future appointments -Counseled on diet and exercise extensively Recommended to continue current medication -Discussed with patient about most recent potassium lab  which was elevated at 5.3 - advised for patient to take furosemide for the next 3-5 days, but also to reduce potassium intake in his diet (currently eating 1 banana daily, also eats potatoes at least once daily) - patient to have potassium rechecked with PCP - possible in future should potassium remain elevated to switch spironolactone and replaced with furosemide daily / hctz   Hyperlipidemia: (LDL goal < 100) -Controlled Lab Results  Component Value Date   LDLCALC 86 11/25/2017  -Current treatment: Atorvastatin 94mg68m tablet daily  -Medications previously tried: n/a  -Current dietary patterns: reports that he tries to watch intake of high cholesterol foods but at times diet can be elevated in cholesterol intake -Current exercise habits: limited due to chronic back pain -Educated on Cholesterol goals;  Benefits of statin for ASCVD risk reduction; Importance of limiting foods high in cholesterol; -Counseled on diet and exercise extensively Recommended to continue current medication  Atrial Fibrillation (Goal: prevent stroke and major bleeding) -Controlled -CHADS2VASc score: Age (2 points) and Hypertension history (1 point) -Current treatment: Rate control: Metoprolol succinate 25mg 38mtablet daily  Anticoagulation: Eliquis 2.5mg - 76mablet twice daily  -Medications previously tried: n/a -History of bleeding ulcers x 4  - has always taken eliquis 2.5mg dos54m - no issues - would recommend to continue on current dosage of eliquis -Home BP and HR readings: averaging 120's/70's - this AM was 164/91 HR was 81 - at times can be elevated without cause  - can occasionally drop to 90's/65's  -Counseled on increased risk of stroke due to Afib and benefits of anticoagulation for stroke prevention; importance of adherence to anticoagulant exactly as  prescribed; bleeding risk associated with eliquis and importance of self-monitoring for signs/symptoms of bleeding; avoidance of NSAIDs due to  increased bleeding risk with anticoagulants; importance of regular laboratory monitoring; seeking medical attention after a head injury or if there is blood in the urine/stool; -Recommended to continue current medication  Allergic Rhinitis (Goal: Prevention / treatment of allergies) -Controlled -Current treatment  Fluticasone 12mg/act - 2 sprays into each nostril daily Cetirizine 163m- 1 tablet daily  -Medications previously tried: n/a  -Recommended to continue current medication  GERD (Goal: Prevention / treatment of acid reflux) -Controlled -Current treatment  Famotidine 4073m 1 tablet daily  Pantoprazole 3m1m1 tablet twice daily  -Medications previously tried: n/a  -Recommended to continue current medication  Glaucoma (Goal: Maintenance of appropriate occular pressure) - Follows with Dr. MincAlanda Slimntrolled -Current treatment  Timolol 0.25% ophthalmic solution - 1 drop into both eyes twice daily  Latanoprost 0.005% ophthalmic solution - 1 drop into both eyes once daily  -Medications previously tried: n/a  -Recommended to continue current medication  Chronic Pain (Back Pain) (Goal: Pain Control) -Controlled -Current treatment  Oxycodone 15mg17m tablet 4 times daily as needed  Acetaminophen 500mg 15m tablets 4 times daily  -Medications previously tried: hydrocodone, ibu  -Recommended for patient to reduce APAP intake to no more than 3000mg d73m   Health Maintenance -Vaccine gaps: shingles, COVID booster, influenza vaccine  -Current therapy:  Miralax 17g daily as needed  -Educated on Cost vs benefit of each product must be carefully weighed by individual consumer -Patient is satisfied with current therapy and denies issues -Recommended to continue current medication  Patient Goals/Self-Care Activities Patient will:  - take medications as prescribed check blood pressure daily, document, and provide at future appointments engage in dietary modifications by  reducing potassium intake  Follow Up Plan: Telephone follow up appointment with care management team member scheduled for: 3 months  The patient has been provided with contact information for the care management team and has been advised to call with any health related questions or concerns.     Medication Assistance: None required.  Patient affirms current coverage meets needs.  Patient's preferred pharmacy is:  [id number] - [phone] box? Yes Pt endorses 100% compliance  Care Plan and Follow Up Patient Decision:  Patient agrees to Care Plan and Follow-up.  Plan: Face to Face appointment with care management team member scheduled for: 3 months and The patient has been provided with contact information for the care management team and has been advised to call with any health related questions or concerns.   Mazy Culton CTomasa Blase Clinical Pharmacist, Brainard Eaton22 3:11 PM  Medical screening examination/treatment/procedure(s) were performed by non-physician practitioner and as supervising physician I was immediately available for consultation/collaboration.  I agree with above. Cody Lew Dawes

## 2021-08-16 NOTE — Patient Instructions (Signed)
Cody Frazier,  It was great to talk to you today!  Please call me with any questions or concerns.  Visit Information   PATIENT GOALS:   Goals Addressed             This Visit's Progress    Track and Manage My Blood Pressure-Hypertension       Timeframe:  Long-Range Goal Priority:  High Start Date: 08/16/2021                            Expected End Date: 02/14/2022                      Follow Up Date 11/15/2021   - check blood pressure daily - choose a place to take my blood pressure (home, clinic or office, retail store) - write blood pressure results in a log or diary    Why is this important?   You won't feel high blood pressure, but it can still hurt your blood vessels.  High blood pressure can cause heart or kidney problems. It can also cause a stroke.  Making lifestyle changes like losing a little weight or eating less salt will help.  Checking your blood pressure at home and at different times of the day can help to control blood pressure.  If the doctor prescribes medicine remember to take it the way the doctor ordered.  Call the office if you cannot afford the medicine or if there are questions about it.          Consent to CCM Services: Mr. Loyal was given information about Chronic Care Management services including:  CCM service includes personalized support from designated clinical staff supervised by his physician, including individualized plan of care and coordination with other care providers 24/7 contact phone numbers for assistance for urgent and routine care needs. Service will only be billed when office clinical staff spend 20 minutes or more in a month to coordinate care. Only one practitioner may furnish and bill the service in a calendar month. The patient may stop CCM services at any time (effective at the end of the month) by phone call to the office staff. The patient will be responsible for cost sharing (co-pay) of up to 20% of the service fee  (after annual deductible is met).  Patient agreed to services and verbal consent obtained.   Patient verbalizes understanding of instructions provided today and agrees to view in [place].   Face to Face appointment with care management team member scheduled for: 3 months The patient has been provided with contact information for the care management team and has been advised to call with any health related questions or concerns.   Tomasa Blase, PharmD Clinical Pharmacist, [place]   CLINICAL CARE PLAN: Patient Care Plan: CCM Care Plan     Problem Identified: Hypertension, Hyperlipidemia, Atrial Fibrillation, Coronary Artery Disease, GERD, Allergic Rhinitis, and Chronic Pain (back)   Priority: High  Onset Date: 08/16/2021     Long-Range Goal: Disease Management   Start Date: 08/16/2021  Expected End Date: 02/14/2022  This Visit's Progress: On track  Priority: High  Note:   Current Barriers:  Unable to independently monitor therapeutic efficacy  Pharmacist Clinical Goal(s):  Patient will achieve adherence to monitoring guidelines and medication adherence to achieve therapeutic efficacy maintain control of BP, LDL, and Pain as evidenced by BP logs, next lipid panel, pain levels throughout the day  through collaboration with PharmD and provider.   Interventions: 1:1 collaboration with Plotnikov, Evie Lacks, MD regarding development and update of comprehensive plan of care as evidenced by provider attestation and co-signature Inter-disciplinary care team collaboration (see longitudinal plan of care) Comprehensive medication review performed; medication list updated in electronic medical record  Hypertension (BP goal <140/90) -Controlled -Current treatment: Metoprolol Succinate 82m - 1 tablet daily  Spironolactone 276m- 1 tablet daily  Furosemide 4066m 1 tablet daily as needed   Telmisartan 35m40m1 tablet daily  -Medications previously tried: diltiazem, losartan,  valsartan  -Current home readings: averaging 120's/70's - this AM was 164/91 HR was 81 - at times can be elevated without cause  - can occasionally drop to 90's/65's -Current dietary habits: notes that he has been eating more salt recently than he usually does - does not drink coffee or tea  -Current exercise habits: very limited due to chronic pain in his back  -Denies hypotensive/hypertensive symptoms -Educated on BP goals and benefits of medications for prevention of heart attack, stroke and kidney damage; Daily salt intake goal < 2300 mg; Exercise goal of 150 minutes per week; Importance of home blood pressure monitoring; Proper BP monitoring technique; Symptoms of hypotension and importance of maintaining adequate hydration; -Counseled to monitor BP at home daily, document, and provide log at future appointments -Counseled on diet and exercise extensively Recommended to continue current medication -Discussed with patient about most recent potassium lab which was elevated at 5.3 - advised for patient to take furosemide for the next 3-5 days, but also to reduce potassium intake in his diet (currently eating 1 banana daily, also eats potatoes at least once daily) - patient to have potassium rechecked with PCP - possible in future should potassium remain elevated to switch spironolactone and replaced with furosemide daily / hctz   Hyperlipidemia: (LDL goal < 100) -Controlled Lab Results  Component Value Date   LDLCALC 86 11/25/2017  -Current treatment: Atorvastatin 35mg38m tablet daily  -Medications previously tried: n/a  -Current dietary patterns: reports that he tries to watch intake of high cholesterol foods but at times diet can be elevated in cholesterol intake -Current exercise habits: limited due to chronic back pain -Educated on Cholesterol goals;  Benefits of statin for ASCVD risk reduction; Importance of limiting foods high in cholesterol; -Counseled on diet and exercise  extensively Recommended to continue current medication  Atrial Fibrillation (Goal: prevent stroke and major bleeding) -Controlled -CHADS2VASc score: Age (2 points) and Hypertension history (1 point) -Current treatment: Rate control: Metoprolol succinate 25mg 75mtablet daily  Anticoagulation: Eliquis 2.5mg - 69mablet twice daily  -Medications previously tried: n/a -History of bleeding ulcers x 4  - has always taken eliquis 2.5mg dos71m - no issues - would recommend to continue on current dosage of eliquis -Home BP and HR readings: averaging 120's/70's - this AM was 164/91 HR was 81 - at times can be elevated without cause  - can occasionally drop to 90's/65's  -Counseled on increased risk of stroke due to Afib and benefits of anticoagulation for stroke prevention; importance of adherence to anticoagulant exactly as prescribed; bleeding risk associated with eliquis and importance of self-monitoring for signs/symptoms of bleeding; avoidance of NSAIDs due to increased bleeding risk with anticoagulants; importance of regular laboratory monitoring; seeking medical attention after a head injury or if there is blood in the urine/stool; -Recommended to continue current medication  Allergic Rhinitis (Goal: Prevention / treatment of allergies) -Controlled -Current treatment  Fluticasone 57mg/act - 2 sprays into each nostril daily Cetirizine 147m- 1 tablet daily  -Medications previously tried: n/a  -Recommended to continue current medication  GERD (Goal: Prevention / treatment of acid reflux) -Controlled -Current treatment  Famotidine 407m 1 tablet daily  Pantoprazole 63m54m1 tablet twice daily  -Medications previously tried: n/a  -Recommended to continue current medication  Glaucoma (Goal: Maintenance of appropriate occular pressure) - Follows with Dr. MincAlanda Slimntrolled -Current treatment  Timolol 0.25% ophthalmic solution - 1 drop into both eyes twice daily  Latanoprost 0.005%  ophthalmic solution - 1 drop into both eyes once daily  -Medications previously tried: n/a  -Recommended to continue current medication  Chronic Pain (Back Pain) (Goal: Pain Control) -Controlled -Current treatment  Oxycodone 15mg5m tablet 4 times daily as needed  Acetaminophen 500mg 58m tablets 4 times daily  -Medications previously tried: hydrocodone, ibu  -Recommended for patient to reduce APAP intake to no more than 3000mg d4m   Health Maintenance -Vaccine gaps: shingles, COVID booster, influenza vaccine  -Current therapy:  Miralax 17g daily as needed  -Educated on Cost vs benefit of each product must be carefully weighed by individual consumer -Patient is satisfied with current therapy and denies issues -Recommended to continue current medication  Patient Goals/Self-Care Activities Patient will:  - take medications as prescribed check blood pressure daily, document, and provide at future appointments engage in dietary modifications by reducing potassium intake  Follow Up Plan: Telephone follow up appointment with care management team member scheduled for: 3 months  The patient has been provided with contact information for the care management team and has been advised to call with any health related questions or concerns.

## 2021-09-11 DIAGNOSIS — I480 Paroxysmal atrial fibrillation: Secondary | ICD-10-CM | POA: Diagnosis not present

## 2021-09-11 DIAGNOSIS — I10 Essential (primary) hypertension: Secondary | ICD-10-CM | POA: Diagnosis not present

## 2021-09-11 DIAGNOSIS — E785 Hyperlipidemia, unspecified: Secondary | ICD-10-CM | POA: Diagnosis not present

## 2021-09-18 ENCOUNTER — Ambulatory Visit: Payer: Medicare Other | Admitting: Cardiovascular Disease

## 2021-10-03 ENCOUNTER — Other Ambulatory Visit: Payer: Self-pay | Admitting: Gastroenterology

## 2021-10-03 DIAGNOSIS — K2270 Barrett's esophagus without dysplasia: Secondary | ICD-10-CM

## 2021-10-03 DIAGNOSIS — D508 Other iron deficiency anemias: Secondary | ICD-10-CM

## 2021-10-16 ENCOUNTER — Encounter: Payer: Self-pay | Admitting: Internal Medicine

## 2021-10-16 ENCOUNTER — Ambulatory Visit (INDEPENDENT_AMBULATORY_CARE_PROVIDER_SITE_OTHER): Payer: Medicare Other | Admitting: Internal Medicine

## 2021-10-16 ENCOUNTER — Other Ambulatory Visit: Payer: Self-pay

## 2021-10-16 DIAGNOSIS — M519 Unspecified thoracic, thoracolumbar and lumbosacral intervertebral disc disorder: Secondary | ICD-10-CM | POA: Diagnosis not present

## 2021-10-16 DIAGNOSIS — L2084 Intrinsic (allergic) eczema: Secondary | ICD-10-CM | POA: Diagnosis not present

## 2021-10-16 DIAGNOSIS — I10 Essential (primary) hypertension: Secondary | ICD-10-CM | POA: Diagnosis not present

## 2021-10-16 DIAGNOSIS — Z6835 Body mass index (BMI) 35.0-35.9, adult: Secondary | ICD-10-CM

## 2021-10-16 DIAGNOSIS — N182 Chronic kidney disease, stage 2 (mild): Secondary | ICD-10-CM | POA: Diagnosis not present

## 2021-10-16 DIAGNOSIS — I480 Paroxysmal atrial fibrillation: Secondary | ICD-10-CM

## 2021-10-16 MED ORDER — OXYCODONE HCL 15 MG PO TABS
15.0000 mg | ORAL_TABLET | Freq: Four times a day (QID) | ORAL | 0 refills | Status: DC
Start: 2021-10-16 — End: 2022-01-11

## 2021-10-16 MED ORDER — OXYCODONE HCL 15 MG PO TABS: 15.0000 mg | ORAL_TABLET | Freq: Four times a day (QID) | ORAL | 0 refills | Status: DC

## 2021-10-16 MED ORDER — TRIAMCINOLONE ACETONIDE 0.1 % EX CREA: 1.0000 "application " | TOPICAL_CREAM | Freq: Three times a day (TID) | CUTANEOUS | 3 refills | Status: AC

## 2021-10-16 NOTE — Assessment & Plan Note (Signed)
Discussed weight loss. She started to lose weight. He needs to continue to lose weight on diet. Worse BMI 39

## 2021-10-16 NOTE — Assessment & Plan Note (Signed)
Continue with oxycodone 15 mg 4 times a day as needed  Potential benefits of a long term opioids use as well as potential risks (i.e. addiction risk, apnea etc) and complications (i.e. Somnolence, constipation and others) were explained to the patient and were aknowledged.

## 2021-10-16 NOTE — Assessment & Plan Note (Signed)
  Cont on Telmisartan, Lasix 

## 2021-10-16 NOTE — Assessment & Plan Note (Signed)
New Triamc cream Aquaphore

## 2021-10-16 NOTE — Patient Instructions (Signed)
The Obesity Code book by Sharman Cheek   These suggestions will probably help you to improve your metabolism if you are not overweight and to lose weight if you are overweight: 1.  Reduce your consumption of sugars and starches.  Eliminate high fructose corn syrup from your diet.  Reduce your consumption of processed foods.  For desserts try to have seasonal fruits, berries, nuts, cheeses or dark chocolate with more than 70% cacao. 2.  Do not snack 3.  You do not have to eat breakfast.  If you choose to have breakfast - eat plain greek yogurt, eggs, oatmeal (without sugar) - use honey if you need to. 4.  Drink water, freshly brewed unsweetened tea (green, black or herbal) or coffee.  Do not drink sodas including diet sodas , juices, beverages sweetened with artificial sweeteners. 5.  Reduce your consumption of refined grains. 6.  Avoid protein drinks such as Optifast, Slim fast etc. Eat chicken, fish, meat, dairy and beans for your sources of protein. 7.  Natural unprocessed fats like cold pressed virgin olive oil, butter, coconut oil are good for you.  Eat avocados. 8.  Increase your consumption of fiber.  Fruits, berries, vegetables, whole grains, flaxseed, chia seeds, beans, popcorn, nuts, oatmeal are good sources of fiber 9.  Use vinegar in your diet, i.e. apple cider vinegar, red wine or balsamic vinegar 10.  You can try fasting.  For example you can skip breakfast and lunch every other day (24-hour fast) 11.  Stress reduction, good night sleep, relaxation, meditation, yoga and other physical activity is likely to help you to maintain low weight too. 12.  If you drink alcohol, limit your alcohol intake to no more than 2 drinks a day. '''''''''''''''''''''''''''''''''''''''''''''''''''''''''''''''''''''''''''''''''''''''''''''''''''''''''''''''''''''''''''

## 2021-10-16 NOTE — Assessment & Plan Note (Signed)
Hydrate well ?Monitor GFR ?

## 2021-10-16 NOTE — Assessment & Plan Note (Signed)
Cont on anticoagulation - Eliquis 

## 2021-10-16 NOTE — Progress Notes (Signed)
Subjective:  Patient ID: Cody Frazier, male    DOB: 1946-08-02  Age: 75 y.o. MRN: [id number]  CC: Follow-up (3 month f/u)    HPI JADIS MIKA presents for LBP, CAD, dyslipidemia f/u  Outpatient Medications Prior to Visit  Medication Sig Dispense Refill   acetaminophen (TYLENOL) 500 MG tablet Take 1,000 mg by mouth every 6 (six) hours as needed for mild pain or moderate pain. Maximum daily dose of 3g     atorvastatin (LIPITOR) 20 MG tablet TAKE 1 TABLET BY MOUTH EVERY DAY 90 tablet 3   cetirizine (ZYRTEC) 10 MG tablet Take 10 mg by mouth daily.     ELIQUIS 2.5 MG TABS tablet TAKE 1 TABLET BY MOUTH TWICE A DAY 60 tablet 11   famotidine (PEPCID) 40 MG tablet Take 1 tablet (40 mg total) by mouth at bedtime. 30 tablet 11   fluticasone (FLONASE) 50 MCG/ACT nasal spray SPRAY 2 SPRAYS INTO EACH NOSTRIL EVERY DAY 48 mL 5   latanoprost (XALATAN) 0.005 % ophthalmic solution Place 1 drop into both eyes at bedtime.  11   metoprolol succinate (TOPROL-XL) 25 MG 24 hr tablet TAKE 1 TABLET BY MOUTH EVERY DAY 90 tablet 2   pantoprazole (PROTONIX) 40 MG tablet TAKE 1 TABLET BY MOUTH TWICE A DAY 180 tablet 0   polyethylene glycol (MIRALAX / GLYCOLAX) packet Take 17 g by mouth daily. 14 each 0   PRESCRIPTION MEDICATION Inhale into the lungs at bedtime. CPAP     spironolactone (ALDACTONE) 25 MG tablet TAKE 1 TABLET BY MOUTH EVERY DAY 90 tablet 3   telmisartan (MICARDIS) 20 MG tablet Take 1 tablet (20 mg total) by mouth daily. 90 tablet 3   timolol (TIMOPTIC) 0.25 % ophthalmic solution Place 1 drop into both eyes 2 (two) times daily.      oxyCODONE (ROXICODONE) 15 MG immediate release tablet Take 1 tablet (15 mg total) by mouth 4 (four) times daily. 120 tablet 0   oxyCODONE (ROXICODONE) 15 MG immediate release tablet Take 1 tablet (15 mg total) by mouth 4 (four) times daily. 120 tablet 0   oxyCODONE (ROXICODONE) 15 MG immediate release tablet Take 1 tablet (15 mg total) by mouth 4 (four) times daily.  120 tablet 0   furosemide (LASIX) 40 MG tablet Take 1 tablet (40 mg total) by mouth daily as needed. 30 tablet 2   No facility-administered medications prior to visit.    ROS: Review of Systems  Constitutional:  Negative for appetite change, fatigue and unexpected weight change.  HENT:  Negative for congestion, nosebleeds, sneezing, sore throat and trouble swallowing.   Eyes:  Negative for itching and visual disturbance.  Respiratory:  Negative for cough.   Cardiovascular:  Negative for chest pain, palpitations and leg swelling.  Gastrointestinal:  Negative for abdominal distention, blood in stool, diarrhea and nausea.  Genitourinary:  Negative for frequency and hematuria.  Musculoskeletal:  Positive for arthralgias and gait problem. Negative for back pain, joint swelling and neck pain.  Skin:  Negative for rash.  Neurological:  Negative for dizziness, tremors, speech difficulty and weakness.  Psychiatric/Behavioral:  Negative for agitation, dysphoric mood, sleep disturbance and suicidal ideas. The patient is nervous/anxious.    Objective:  BP 120/70 (BP Location: Left Arm)   Pulse 81   Temp 98.8 F (37.1 C) (Oral)   Ht 5\' 6"  (1.676 m)   Wt 243 lb 3.2 oz (110.3 kg)   SpO2 98%   BMI 39.25 kg/m   BP Readings  from Last 3 Encounters:  10/16/21 120/70  07/13/21 118/70  06/14/21 122/78    Wt Readings from Last 3 Encounters:  10/16/21 243 lb 3.2 oz (110.3 kg)  07/13/21 236 lb 12.8 oz (107.4 kg)  06/14/21 231 lb (104.8 kg)    Physical Exam Constitutional:      General: He is not in acute distress.    Appearance: He is well-developed. He is obese.     Comments: NAD  Eyes:     Conjunctiva/sclera: Conjunctivae normal.     Pupils: Pupils are equal, round, and reactive to light.  Neck:     Thyroid: No thyromegaly.     Vascular: No JVD.  Cardiovascular:     Rate and Rhythm: Normal rate and regular rhythm.     Heart sounds: Normal heart sounds. No murmur heard.   No friction  rub. No gallop.  Pulmonary:     Effort: Pulmonary effort is normal. No respiratory distress.     Breath sounds: Normal breath sounds. No wheezing or rales.  Chest:     Chest wall: No tenderness.  Abdominal:     General: Bowel sounds are normal. There is no distension.     Palpations: Abdomen is soft. There is no mass.     Tenderness: There is no abdominal tenderness. There is no guarding or rebound.  Musculoskeletal:        General: No tenderness. Normal range of motion.     Cervical back: Normal range of motion.  Lymphadenopathy:     Cervical: No cervical adenopathy.  Skin:    General: Skin is warm and dry.     Findings: No rash.  Neurological:     Mental Status: He is alert and oriented to person, place, and time.     Cranial Nerves: No cranial nerve deficit.     Motor: Weakness present. No abnormal muscle tone.     Coordination: Coordination normal.     Gait: Gait abnormal.     Deep Tendon Reflexes: Reflexes are normal and symmetric.  Psychiatric:        Behavior: Behavior normal.        Thought Content: Thought content normal.        Judgment: Judgment normal.    Lab Results  Component Value Date   WBC 6.3 07/13/2021   HGB 13.1 07/13/2021   HCT 39.7 07/13/2021   PLT 287.0 07/13/2021   GLUCOSE 98 07/13/2021   CHOL 196 11/20/2018   TRIG 269.0 (H) 11/20/2018   HDL 43.60 11/20/2018   LDLDIRECT 109.0 11/20/2018   LDLCALC 86 11/25/2017   ALT 12 07/13/2021   AST 14 07/13/2021   NA 137 07/13/2021   K 5.3 No hemolysis seen (H) 07/13/2021   CL 103 07/13/2021   CREATININE 1.33 07/13/2021   BUN 27 (H) 07/13/2021   CO2 27 07/13/2021   TSH 0.96 07/13/2021   PSA 0.16 11/20/2018   INR 1.1 08/12/2020   HGBA1C 5.7 07/13/2021    DG Chest 2 View  Result Date: 08/13/2020 CLINICAL DATA:  Iron deficiency, anemia. EXAM: CHEST - 2 VIEW COMPARISON:  05/19/2018 FINDINGS: Low lung volumes. Mild diffuse interstitial prominence. No confluent airspace disease. Heart size upper limits  normal for technique. Aortic Atherosclerosis (ICD10-170.0). Blunting of posterior costophrenic angles suggesting small effusions. No pneumothorax. Spondylitic changes near the thoracolumbar junction. IMPRESSION: Low volumes with small pleural effusions. Electronically Signed   By: Lucrezia Europe M.D.   On: 08/13/2020 14:14   CT ABDOMEN PELVIS W CONTRAST  Result Date: 08/13/2020 CLINICAL DATA:  Anemia.  History of gastric ulcers. EXAM: CT ABDOMEN AND PELVIS WITH CONTRAST TECHNIQUE: Multidetector CT imaging of the abdomen and pelvis was performed using the standard protocol following bolus administration of intravenous contrast. CONTRAST:  127mL OMNIPAQUE IOHEXOL 300 MG/ML  SOLN COMPARISON:  05/10/2017 FINDINGS: Lower chest: Coronary calcifications. New small pleural effusions, right greater than left. Dependent atelectasis in the posterior right lower lobe. Hepatobiliary: No focal liver abnormality is seen. Gallbladder unremarkable. CBD is mildly distended up to 12 mm diameter, seen down to the ampulla without radiodense calculus. Portal vein patent. Pancreas: Diffuse pancreatic atrophy without ductal dilatation. Spleen: Normal in size without focal abnormality. Adrenals/Urinary Tract: Adrenal glands unremarkable. Bilateral renal cysts, stable since previous. No hydronephrosis. Urinary bladder physiologically distended. Stomach/Bowel: Postop changes of [place] 1. Stomach and small bowel are nondilated. Appendix surgically absent. The colon is nondilated, with scattered descending and sigmoid diverticula; no adjacent inflammatory change. Vascular/Lymphatic: Aortoiliac calcified atheromatous plaque without aneurysm or stenosis. Circumaortic left renal vein, an anatomic variant. Portal vein patent. No abdominal or pelvic adenopathy. Reproductive: Prostate is unremarkable. Other: Bilateral pelvic phleboliths.  No ascites.  No free air. Musculoskeletal: Thoracolumbar dextroscoliosis apex L2 multilevel thoracolumbar  spondylitic change. Previous instrumented spinal fusion L1-S1. No fracture or worrisome bone lesion. IMPRESSION: 1. No acute findings. 2. New small bilateral pleural effusions, right greater than left. 3. Coronary and  Aortic Atherosclerosis (ICD10-I70.0). 4. Descending and sigmoid diverticulosis. Electronically Signed   By: Lucrezia Europe M.D.   On: 08/13/2020 14:12    Assessment & Plan:   Problem List Items Addressed This Visit     CRI (chronic renal insufficiency), stage 2 (mild)    Hydrate well Monitor GFR      Eczema    New Triamc cream Aquaphore      Essential hypertension       Cont on Telmisartan, Lasix      Lumbar disc disease    Continue with oxycodone 15 mg 4 times a day as needed  Potential benefits of a long term opioids use as well as potential risks (i.e. addiction risk, apnea etc) and complications (i.e. Somnolence, constipation and others) were explained to the patient and were aknowledged.      Obesity    Discussed weight loss. She started to lose weight. He needs to continue to lose weight on diet. Worse BMI 39      Paroxysmal atrial fibrillation (HCC)    Cont on anticoagulation - Eliquis          Follow-up: Return in about 3 months (around 01/14/2022).   Walker Kehr, MD

## 2021-11-15 ENCOUNTER — Other Ambulatory Visit: Payer: Self-pay

## 2021-11-15 ENCOUNTER — Ambulatory Visit (INDEPENDENT_AMBULATORY_CARE_PROVIDER_SITE_OTHER): Payer: Medicare Other

## 2021-11-15 DIAGNOSIS — I10 Essential (primary) hypertension: Secondary | ICD-10-CM

## 2021-11-15 DIAGNOSIS — I480 Paroxysmal atrial fibrillation: Secondary | ICD-10-CM

## 2021-11-15 NOTE — Progress Notes (Addendum)
Chronic Care Management Pharmacy Note  11/15/2021 Name:  Cody Frazier MRN:  [id number] DOB:  November 26, 1945  Summary: - Patient reports that he has been doing well, BP typically averaging 120/70's , at times BP can increase to 180/110 - typically happens 2 times / month  -Notes that he continues to use furosemide 35m daily as needed - typically taking about 2 times per week -No issues with eliquis, no abnormal bruising or bleeding -Patient has been weighing himself daily as instructed by cardiology - takes furosemide if weight has increased by 3-5 lbs -Noted recently in the AM feels short of breath until about lunchtime - finds that resting helps reduce this feeling    Recommendations/Changes made from today's visit: -Recommending for patient to continue monitoring blood pressure and weight daily, to reach out to office/ cardiology should BP >150/90 or if he has gained more than 5lbs in a week / 3 lbs in a day -Reviewed with patient previously elevated potassium level, would recommend rechecking with next office visit - avoid excessive potassium intake at this time (bananas / potatoes) - possibly hold spironolactone should K+ remain elevated   Subjective: Cody WILSONis an 76y.o. year old male who is a primary patient of Cody Frazier.  The CCM team was consulted for assistance with disease management and care coordination needs.    Engaged with patient face to face for follow up visit in response to provider referral for pharmacy case management and/or care coordination services.   Consent to Services:  The patient was given the following information about Chronic Care Management services today, agreed to services, and gave verbal consent: 1. CCM service includes personalized support from designated clinical staff supervised by the primary care provider, including individualized plan of care and coordination with other care providers 2. 24/7 contact phone numbers for  assistance for urgent and routine care needs. 3. Service will only be billed when office clinical staff spend 20 minutes or more in a month to coordinate care. 4. Only one practitioner may furnish and bill the service in a calendar month. 5.The patient may stop CCM services at any time (effective at the end of the month) by phone call to the office staff. 6. The patient will be responsible for cost sharing (co-pay) of up to 20% of the service fee (after annual deductible is met). Patient agreed to services and consent obtained.  Patient Care Team: Cody Frazier as PCP - General Croitoru, MDani Gobble Frazier as PCP - Cardiology (Cardiology) BLeeroy Cha Frazier (Neurosurgery) GMichael Boston Frazier (General Surgery) SLadene Artist Frazier (Gastroenterology) DFranchot Gallo Frazier as Attending Physician (Urology) TJacolyn Reedy Frazier as Consulting Physician (Cardiology) A[place] [facility] Consulting Physician (Ophthalmology) STomasa Blase [facility]as Pharmacist (Pharmacist)  Recent office visits:  10/16/2021 - Dr. PAlain Marion- no changes to medications   Recent consult visits:  None since last visit    Hospital visits:  None in previous 6 months  Objective:  Lab Results  Component Value Date   CREATININE 1.33 07/13/2021   BUN 27 (H) 07/13/2021   GFR 52.17 (L) 07/13/2021   GFRNONAA 47 (L) 10/18/2020   GFRAA 55 (L) 10/18/2020   NA 137 07/13/2021   K 5.3 No hemolysis seen (H) 07/13/2021   CALCIUM 10.3 07/13/2021   CO2 27 07/13/2021   GLUCOSE 98 07/13/2021    Lab Results  Component Value Date/Time   HGBA1C 5.7 07/13/2021 03:56 PM   HGBA1C 5.5  05/23/2020 02:07 PM   GFR 52.17 (L) 07/13/2021 03:56 PM   GFR 55.95 (L) 08/26/2019 10:58 AM    Last diabetic Eye exam:  No results found for: HMDIABEYEEXA  Last diabetic Foot exam:  No results found for: HMDIABFOOTEX   Lab Results  Component Value Date   CHOL 196 11/20/2018   HDL 43.60 11/20/2018   LDLCALC 86 11/25/2017    LDLDIRECT 109.0 11/20/2018   TRIG 269.0 (H) 11/20/2018   CHOLHDL 4 11/20/2018    Hepatic Function Latest Ref Rng & Units 07/13/2021 08/13/2020 08/12/2020  Total Protein 6.0 - 8.3 g/dL 7.3 5.4(L) 5.8(L)  Albumin 3.5 - 5.2 g/dL 4.2 2.8(L) 3.0(L)  AST 0 - 37 U/L '14 28 15  ' ALT 0 - 53 U/L '12 7 10  ' Alk Phosphatase 39 - 117 U/L 118(H) 69 84  Total Bilirubin 0.2 - 1.2 mg/dL 0.5 0.9 0.6  Bilirubin, Direct 0.0 - 0.2 mg/dL - - -    Lab Results  Component Value Date/Time   TSH 0.96 07/13/2021 03:56 PM   TSH 1.04 05/23/2020 02:07 PM    CBC Latest Ref Rng & Units 07/13/2021 03/24/2021 09/20/2020  WBC 4.0 - 10.5 K/uL 6.3 6.5 7.8  Hemoglobin 13.0 - 17.0 g/dL 13.1 13.9 13.0  Hematocrit 39.0 - 52.0 % 39.7 41.5 40.1  Platelets 150.0 - 400.0 K/uL 287.0 298 317    No results found for: VD25OH  Clinical ASCVD: No  The 10-year ASCVD risk score (Arnett DK, et al., 2019) is: 47.2%   Values used to calculate the score:     Age: 76 years     Sex: Male     Is Non-Hispanic African American: No     Diabetic: Yes     Tobacco smoker: No     Systolic Blood Pressure: 015 mmHg     Is BP treated: Yes     HDL Cholesterol: 43.6 mg/dL     Total Cholesterol: 196 mg/dL    Depression screen [facility] 2/9 12/21/2020 09/12/2020 02/22/2020  Decreased Interest 0 0 0  Down, Depressed, Hopeless 0 0 0  PHQ - 2 Score 0 0 0  Altered sleeping - - -  Tired, decreased energy - - -  Change in appetite - - -  Feeling bad or failure about yourself  - - -  Trouble concentrating - - -  Moving slowly or fidgety/restless - - -  Suicidal thoughts - - -  PHQ-9 Score - - -  Some recent data might be hidden    Social History   Tobacco Use  Smoking Status Former  Smokeless Tobacco Never  Tobacco Comments   as a teenager   BP Readings from Last 3 Encounters:  10/16/21 120/70  07/13/21 118/70  06/14/21 122/78   Pulse Readings from Last 3 Encounters:  10/16/21 81  07/13/21 76  06/14/21 71   Wt Readings from Last 3 Encounters:   10/16/21 243 lb 3.2 oz (110.3 kg)  07/13/21 236 lb 12.8 oz (107.4 kg)  06/14/21 231 lb (104.8 kg)   BMI Readings from Last 3 Encounters:  10/16/21 39.25 kg/m  07/13/21 38.22 kg/m  06/14/21 37.28 kg/m    Assessment/Interventions: Review of patient past medical history, allergies, medications, health status, including review of consultants reports, laboratory and other test data, was performed as part of comprehensive evaluation and provision of chronic care management services.   SDOH:  (Social Determinants of Health) assessments and interventions performed: Yes  SDOH Screenings   Alcohol Screen: Low Risk  Last Alcohol Screening Score (AUDIT): 0  Depression (PHQ2-9): Low Risk    PHQ-2 Score: 0  Financial Resource Strain: Low Risk    Difficulty of Paying Living Expenses: Not hard at all  Food Insecurity: No Food Insecurity   Worried About Charity fundraiser in the Last Year: Never true   Ran Out of Food in the Last Year: Never true  Housing: Low Risk    Last Housing Risk Score: 0  Physical Activity: Inactive   Days of Exercise per Week: 0 days   Minutes of Exercise per Session: 0 min  Social Connections: Engineer, building services of Communication with Friends and Family: More than three times a week   Frequency of Social Gatherings with Friends and Family: More than three times a week   Attends Religious Services: More than 4 times per year   Active Member of [employer] or Organizations: Yes   Attends Music therapist: More than 4 times per year   Marital Status: Married  Stress: No Stress Concern Present   Feeling of Stress : Not at all  Tobacco Use: Medium Risk   Smoking Tobacco Use: Former   Smokeless Tobacco Use: Never   Passive Exposure: Not on Pensions consultant Needs: No Transportation Needs   Lack of Transportation (Medical): No   Lack of Transportation (Non-Medical): No    CCM Care Plan  Allergies  Allergen Reactions   Codeine  Phosphate Swelling   Nsaids Other (See Comments)    ulcers   Adhesive [Tape] Rash    ekg strips    Medications Reviewed Today     Reviewed by Tomasa Blase, [facility] (Pharmacist) on 11/15/21 at 1452  Med List Status: <None>   Medication Order Taking? Sig Documenting Provider Last Dose Status Informant  acetaminophen (TYLENOL) 500 MG tablet [id number] Yes Take 1,000 mg by mouth every 6 (six) hours as needed for mild pain or moderate pain. Maximum daily dose of 3g Provider, Historical, Frazier Taking Active Multiple Informants  atorvastatin (LIPITOR) 20 MG tablet [id number] Yes TAKE 1 TABLET BY MOUTH EVERY DAY Plotnikov, Evie Lacks, Frazier Taking Active   cetirizine (ZYRTEC) 10 MG tablet [id number] Yes Take 10 mg by mouth daily. Provider, Historical, Frazier Taking Active Multiple Informants  ELIQUIS 2.5 MG TABS tablet [id number] Yes TAKE 1 TABLET BY MOUTH TWICE A DAY Plotnikov, Evie Lacks, Frazier Taking Active   famotidine (PEPCID) 40 MG tablet [id number] Yes Take 1 tablet (40 mg total) by mouth at bedtime. Ladene Artist, Frazier Taking Active Multiple Informants  fluticasone [facility]) 50 MCG/ACT nasal spray [id number] Yes SPRAY 2 SPRAYS INTO EACH NOSTRIL EVERY DAY Plotnikov, Evie Lacks, Frazier Taking Active   furosemide (LASIX) 40 MG tablet [id number] Yes Take 40 mg by mouth daily as needed. Provider, Historical, Frazier Taking Active   latanoprost (XALATAN) 0.005 % ophthalmic solution [id number] Yes Place 1 drop into both eyes at bedtime. Provider, Historical, Frazier Taking Active Multiple Informants  metoprolol succinate (TOPROL-XL) 25 MG 24 hr tablet [id number] Yes TAKE 1 TABLET BY MOUTH EVERY DAY Cleaver, Jossie Ng, NP Taking Active   oxyCODONE (ROXICODONE) 15 MG immediate release tablet [id number] Yes Take 1 tablet (15 mg total) by mouth 4 (four) times daily. Plotnikov, Evie Lacks, Frazier Taking Active   oxyCODONE (ROXICODONE) 15 MG immediate release tablet [id number] Yes Take 1 tablet (15 mg total) by mouth 4 (four) times daily. Plotnikov,  Evie Lacks, Frazier Taking Active   oxyCODONE (ROXICODONE) 15 MG immediate release tablet  [id number] Yes Take 1 tablet (15 mg total) by mouth 4 (four) times daily. Plotnikov, Evie Lacks, Frazier Taking Active   pantoprazole (PROTONIX) 40 MG tablet [id number] Yes TAKE 1 TABLET BY MOUTH TWICE A DAY Ladene Artist, Frazier Taking Active   polyethylene glycol [facility] / GLYCOLAX) packet [id number] Yes Take 17 g by mouth daily. Dessa Phi, DO Taking Active Multiple Informants  PRESCRIPTION MEDICATION [id number] Yes Inhale into the lungs at bedtime. CPAP Provider, Historical, Frazier Taking Active   spironolactone (ALDACTONE) 25 MG tablet [id number] Yes TAKE 1 TABLET BY MOUTH EVERY DAY Croitoru, Mihai, Frazier Taking Active   telmisartan (MICARDIS) 20 MG tablet [id number] Yes Take 1 tablet (20 mg total) by mouth daily. Croitoru, Mihai, Frazier Taking Active   timolol (TIMOPTIC) 0.25 % ophthalmic solution [id number] Yes Place 1 drop into both eyes 2 (two) times daily.  Provider, Historical, Frazier Taking Active Multiple Informants  triamcinolone cream (KENALOG) 0.1 % [id number] Yes Apply 1 application topically 3 (three) times daily. Plotnikov, Evie Lacks, Frazier Taking Active             Patient Active Problem List   Diagnosis Date Noted   CRI (chronic renal insufficiency), stage 2 (mild) 07/14/2021   Hearing loss 09/12/2020   Memory difficulties 09/12/2020   Symptomatic anemia 08/12/2020   Aortic valve stenosis, nonrheumatic 08/10/2020   PAH (pulmonary artery hypertension) ([place]) 08/10/2020   Long term (current) use of anticoagulants 08/10/2020   Essential hypertension 08/10/2020   Vitamin D deficiency 09/08/2019   Edema 06/20/2019   Foot deformity 06/17/2019   OSA on CPAP 05/27/2019   Wrist fracture, closed 02/24/2019   Pulmonary hypertension ([place]) 01/01/2019   Obesity 05/19/2018   Paroxysmal atrial fibrillation ([place]) 05/15/2017   Atherosclerosis of aorta ([place]) 05/15/2017   Pressure injury of skin 05/15/2017   Internal hernia  05/10/2017   DOE (dyspnea on exertion) 06/12/2016   Warts 05/26/2015   Eczema 02/22/2014   Neoplasm of uncertain behavior of skin 03/01/2013   History of nephrolithiasis 08/19/2012   Rash 08/13/2011   Cerumen impaction 02/09/2011   Actinic keratosis 02/09/2011   Anxiety state 10/20/2010   SBO (small bowel obstruction) ([place]) 01/14/2009   Upper gastrointestinal bleeding 08/12/2008   History of upper gastrointestinal bleeding 08/12/2008   GERD 09/11/2007   Dyslipidemia 06/09/2007   Iron deficiency anemia 06/09/2007   Hypertensive heart disease without CHF 06/09/2007   Lumbar disc disease 06/09/2007    Immunization History  Administered Date(s) Administered   DT (Pediatric) 02/09/2011   Fluad Quad(high Dose 65+) 08/14/2020   Influenza Split 08/13/2011, 08/19/2012   Influenza Whole 09/26/2009, 08/01/2010   Influenza, High Dose Seasonal PF 08/25/2013, 10/04/2015, 08/22/2016, 08/23/2017, 08/19/2018, 07/27/2019   Influenza,inj,Quad PF,6+ Mos 08/25/2014   Influenza-Unspecified 08/12/2021   PFIZER(Purple Top)SARS-COV-2 Vaccination 12/09/2019, 12/30/2019, 08/28/2020, 08/12/2021   Pneumococcal Conjugate-13 11/22/2014   Pneumococcal Polysaccharide-23 11/19/2011   Td 05/26/2015   Tdap 12/26/2018   Zoster, Live 02/17/2008    Conditions to be addressed/monitored:  Hypertension, Hyperlipidemia, Atrial Fibrillation, Coronary Artery Disease, GERD, Allergic Rhinitis, and Chronic Pain (back)  Care Plan : CCM Care Plan  Updates made by Tomasa Blase, RPH since 11/15/2021 12:00 AM     Problem: Hypertension, Hyperlipidemia, Atrial Fibrillation, Coronary Artery Disease, GERD, Allergic Rhinitis, and Chronic Pain (back)   Priority: High  Onset Date: 08/16/2021     Long-Range Goal: Disease Management   Start Date: 08/16/2021  Expected End Date: 02/14/2022  This Visit's Progress: On track  Recent Progress: On track  Priority: High  Note:   Current Barriers:  Unable to independently monitor  therapeutic efficacy  Pharmacist Clinical Goal(s):  Patient will achieve adherence to monitoring guidelines and medication adherence to achieve therapeutic efficacy maintain control of BP, LDL, and Pain as evidenced by BP logs, next lipid panel, pain levels throughout the day  through collaboration with PharmD and provider.   Interventions: 1:1 collaboration with Plotnikov, Evie Lacks, Frazier regarding development and update of comprehensive plan of care as evidenced by provider attestation and co-signature Inter-disciplinary care team collaboration (see longitudinal plan of care) Comprehensive medication review performed; medication list updated in electronic medical record  Hypertension (BP goal <140/90) -Controlled -Current treatment: Metoprolol Succinate 78m - 1 tablet daily  Spironolactone 210m- 1 tablet daily  Furosemide 4044m 1 tablet daily as needed   Telmisartan 33m44m1 tablet daily  -Medications previously tried: diltiazem, losartan, valsartan  -Current home readings: averaging 120's/70's - can elevate to 180/110 at times - typically only occurs about 2 times per month - was instructed by cardiology to take additional half table of telmisartan if this occurs  -Current dietary habits: notes that he has been eating more salt recently than he usually does - does not drink coffee or tea  -Current exercise habits: very limited due to chronic pain in his back  -Denies hypotensive/hypertensive symptoms -Educated on BP goals and benefits of medications for prevention of heart attack, stroke and kidney damage; Daily salt intake goal < 2300 mg; Exercise goal of 150 minutes per week; Importance of home blood pressure monitoring; Proper BP monitoring technique; Symptoms of hypotension and importance of maintaining adequate hydration; -Counseled to monitor BP at home daily, document, and provide log at future appointments -Counseled on diet and exercise extensively Recommended to continue  current medication -Discussed with patient - continue to monitor weight and blood pressure daily, to reach out with elevated BP >150/90, or if he has a weight gain of >3lbs in a day or 5lbs in a week -Advised continued avoidance of potassium containing foods due to last K+ result of 5.3 - would recommend recheck with next office visit - should increased dosage of telmisartan be needed for BP control, would want to ensure appropriate K+ level before initiating   Hyperlipidemia: (LDL goal < 100) -Controlled Lab Results  Component Value Date   LDLCALC 86 11/25/2017  -Current treatment: Atorvastatin 33mg71m tablet daily  -Medications previously tried: n/a  -Current dietary patterns: reports that he tries to watch intake of high cholesterol foods but at times diet can be elevated in cholesterol intake -Current exercise habits: limited due to chronic back pain -Educated on Cholesterol goals;  Benefits of statin for ASCVD risk reduction; Importance of limiting foods high in cholesterol; -Counseled on diet and exercise extensively Recommended to continue current medication  Atrial Fibrillation (Goal: prevent stroke and major bleeding) -Controlled -CHADS2VASc score: Age (2 points) and Hypertension history (1 point) -Current treatment: Rate control: Metoprolol succinate 25mg 70mtablet daily  Anticoagulation: Eliquis 2.5mg - 19mablet twice daily  -Medications previously tried: n/a -History of bleeding ulcers x 4  - has always taken eliquis 2.5mg dos50m - no issues - would recommend to continue on current dosage of eliquis -Home BP and HR readings: averaging 120's/70's - this AM was 164/91 HR was 81 - at times can be elevated without cause  - can occasionally drop to 90's/65's  -Counseled on increased risk of stroke due to Afib and benefits of anticoagulation for  stroke prevention; importance of adherence to anticoagulant exactly as prescribed; bleeding risk associated with eliquis and  importance of self-monitoring for signs/symptoms of bleeding; avoidance of NSAIDs due to increased bleeding risk with anticoagulants; importance of regular laboratory monitoring; seeking medical attention after a head injury or if there is blood in the urine/stool; -Recommended to continue current medication  Allergic Rhinitis (Goal: Prevention / treatment of allergies) -Controlled -Current treatment  Fluticasone 67mg/act - 2 sprays into each nostril daily Cetirizine 115m- 1 tablet daily  -Medications previously tried: n/a  -Recommended to continue current medication  GERD (Goal: Prevention / treatment of acid reflux) -Controlled -Current treatment  Famotidine 4075m 1 tablet daily  Pantoprazole 29m63m1 tablet twice daily  -Medications previously tried: n/a  -Recommended to continue current medication  Glaucoma (Goal: Maintenance of appropriate occular pressure) - Follows with Dr. MincAlanda Slimntrolled -Current treatment  Timolol 0.25% ophthalmic solution - 1 drop into both eyes twice daily  Latanoprost 0.005% ophthalmic solution - 1 drop into both eyes once daily  -Medications previously tried: n/a  -Recommended to continue current medication  Chronic Pain (Back Pain) (Goal: Pain Control) -Controlled -Current treatment  Oxycodone 15mg27m tablet 4 times daily as needed  Acetaminophen 500mg 93m tablets 4 times daily  -Medications previously tried: hydrocodone, ibu  -Recommended for patient to reduce APAP intake to no more than 3000mg d96m   Health Maintenance -Vaccine gaps: shingles, COVID booster, influenza vaccine  -Current therapy:  Miralax 17g daily as needed  -Educated on Cost vs benefit of each product must be carefully weighed by individual consumer -Patient is satisfied with current therapy and denies issues -Recommended to continue current medication  Patient Goals/Self-Care Activities Patient will:  - take medications as prescribed check blood pressure  daily, document, and provide at future appointments engage in dietary modifications by reducing potassium intake  Follow Up Plan: Telephone follow up appointment with care management team member scheduled for: 6 months  The patient has been provided with contact information for the care management team and has been advised to call with any health related questions or concerns.      Medication Assistance: None required.  Patient affirms current coverage meets needs.  Patient's preferred pharmacy is:  [id number] - [phone] box? Yes Pt endorses 100% compliance  Care Plan and Follow Up Patient Decision:  Patient agrees to Care Plan and Follow-up.  Plan: Face to Face appointment with care management team member scheduled for: 6 months and The patient has been provided with contact information for the care management team and has been advised to call with any health related questions or concerns.   Kalyssa Anker CTomasa Blase Clinical Pharmacist,  [place] examination/treatment/procedure(s) were performed by non-physician practitioner and as supervising physician I was immediately available for consultation/collaboration.  I agree with above. Aleksei Lew Dawes

## 2021-11-15 NOTE — Patient Instructions (Signed)
Visit Information  Following are the goals we discussed today:   Track and Manage My Blood Pressure   Timeframe:  Long-Range Goal Priority:  High Start Date: 08/16/2021                            Expected End Date: 11/16/2022                      Follow Up Date 17/09/2022   - check blood pressure daily - choose a place to take my blood pressure (home, clinic or office, retail store) - write blood pressure results in a log or diary    Why is this important?   You won't feel high blood pressure, but it can still hurt your blood vessels.  High blood pressure can cause heart or kidney problems. It can also cause a stroke.  Making lifestyle changes like losing a little weight or eating less salt will help.  Checking your blood pressure at home and at different times of the day can help to control blood pressure.  If the doctor prescribes medicine remember to take it the way the doctor ordered.  Call the office if you cannot afford the medicine or if there are questions about it.  Plan: Telephone follow up appointment with care management team member scheduled for:  6 months  The patient has been provided with contact information for the care management team and has been advised to call with any health related questions or concerns.   Tomasa Blase, PharmD Clinical Pharmacist, [facility]   Please call the care guide team at [phone] if you need to cancel or reschedule your appointment.   Patient verbalizes understanding of instructions provided today and agrees to view in [place].

## 2021-11-16 ENCOUNTER — Other Ambulatory Visit: Payer: Self-pay | Admitting: Internal Medicine

## 2021-11-16 ENCOUNTER — Other Ambulatory Visit: Payer: Self-pay | Admitting: Gastroenterology

## 2021-11-16 DIAGNOSIS — K2270 Barrett's esophagus without dysplasia: Secondary | ICD-10-CM

## 2021-11-16 DIAGNOSIS — D508 Other iron deficiency anemias: Secondary | ICD-10-CM

## 2021-11-20 ENCOUNTER — Ambulatory Visit (INDEPENDENT_AMBULATORY_CARE_PROVIDER_SITE_OTHER): Payer: Medicare Other | Admitting: Cardiovascular Disease

## 2021-11-20 ENCOUNTER — Encounter: Payer: Self-pay | Admitting: Cardiovascular Disease

## 2021-11-20 ENCOUNTER — Other Ambulatory Visit: Payer: Self-pay

## 2021-11-20 VITALS — BP 138/82 | HR 73 | Ht 65.0 in | Wt 238.6 lb

## 2021-11-20 DIAGNOSIS — G4733 Obstructive sleep apnea (adult) (pediatric): Secondary | ICD-10-CM

## 2021-11-20 DIAGNOSIS — R0609 Other forms of dyspnea: Secondary | ICD-10-CM | POA: Diagnosis not present

## 2021-11-20 DIAGNOSIS — I480 Paroxysmal atrial fibrillation: Secondary | ICD-10-CM

## 2021-11-20 DIAGNOSIS — Z79899 Other long term (current) drug therapy: Secondary | ICD-10-CM

## 2021-11-20 DIAGNOSIS — M25474 Effusion, right foot: Secondary | ICD-10-CM

## 2021-11-20 DIAGNOSIS — M25471 Effusion, right ankle: Secondary | ICD-10-CM

## 2021-11-20 DIAGNOSIS — M25475 Effusion, left foot: Secondary | ICD-10-CM | POA: Diagnosis not present

## 2021-11-20 DIAGNOSIS — Z7901 Long term (current) use of anticoagulants: Secondary | ICD-10-CM

## 2021-11-20 DIAGNOSIS — Z9989 Dependence on other enabling machines and devices: Secondary | ICD-10-CM | POA: Diagnosis not present

## 2021-11-20 DIAGNOSIS — M25472 Effusion, left ankle: Secondary | ICD-10-CM

## 2021-11-20 MED ORDER — FUROSEMIDE 40 MG PO TABS: 40.0000 mg | ORAL_TABLET | Freq: Every day | ORAL | 12 refills | Status: DC | PRN

## 2021-11-20 NOTE — Patient Instructions (Signed)
Medication Instructions:  TAKE FUROSEMIDE 40MG  DAILY x3 DAYS THEN TAKE 40MG  EVERY OTHER DAY THEN BACK TO AS NEEDED  *If you need a refill on your cardiac medications before your next appointment, please call your pharmacy*  Lab Work:    CMET AND CBC TODAY  Testing/Procedures:  Echocardiogram - Your physician has requested that you have an echocardiogram. Echocardiography is a painless test that uses sound waves to create images of your heart. It provides your doctor with information about the size and shape of your heart and how well your hearts chambers and valves are working. This procedure takes approximately one hour. There are no restrictions for this procedure. This will be performed at either our [facility] location - [address] [place] location [employer] 2nd floor.   Follow-Up: Your next appointment:  12 month(s) In Person with Shelva Majestic FOR SLEEP-CPAP   Please call our office 2 months in advance to schedule this appointment.  FOLLOW UP WITH Sanda Klein, MD AFTER ECHO  At [facility], you and your health needs are our priority.  As part of our continuing mission to provide you with exceptional heart care, we have created designated Provider Care Teams.  These Care Teams include your primary Cardiologist (physician) and Advanced Practice Providers (APPs -  Physician Assistants and Nurse Practitioners) who all work together to provide you with the care you need, when you need it.

## 2021-11-20 NOTE — Progress Notes (Addendum)
Cardiology Office Note    Date:  11/20/2021   ID:  Mordche, Hedglin 1946/06/21, MRN [id number]  PCP:  Cassandria Anger, MD  Cardiologist:  Shelva Majestic, MD (sleep); Dr. Sallyanne Kuster  F/U sleep evaluation  History of Present Illness:  Cody Frazier is a 76 y.o. male who presents a  27 month F/U sleep evaluation.   Cody Frazier is a former patient of Dr. Wynonia Lawman and now sees Dr. Sallyanne Kuster.  He has a history of hypertension, hyperlipidemia, and paroxysmal atrial fibrillation on anticoagulation therapy.  He underwent an echo Doppler study in February 2020 which showed an EF of 60 to 65% with grade 1 diastolic dysfunction.  There was mild aortic sclerosis, mild pulmonary regurgitation and mild dilation of his ascending aorta.  Due to concerns for obstructive sleep apnea he was referred for a sleep study.  Insurance denied an in lab study and he initially underwent a home sleep evaluation on January 28, 2019.  The home study demonstrated severe sleep apnea with an AHI of 50.9/h.  Events were worse with this supine sleep with AHI 53.9/h versus nonsupine sleep at 26.35/h.  He had severe oxygen desaturation to a nadir of 75%.  Time spent under 89% was 83 minutes.  He snored for 12.4% of his sleep.  He was referred for CPAP titration and fortunately insurance approved an in lab study which was done on April 30, 2019.  CPAP was increased to 13 cm water pressure with excellent result with an AHI of 0 and oxygen nadir at 91%.  His CPAP set up date was May 25, 2019 with [facility] as his DME company.  He has been using a full facemask.  A download was obtained in the office today from July 14, 2019 through August 12, 2019.  This reveals excellent compliance with 100% usage days and average usage at 7 hours and 44 minutes.  At his 13 cm set pressure, AHI is 2.7.  There is no mask leak.  Since initiating CPAP therapy, he notes significant improvement in his sleep.  He denies frequent awakenings.  He  is unaware of breakthrough snoring.  Previous nocturia 3 times per night has essentially resolved.  He is dreaming.  Most nights he sleeps without waking up.  He denies any hypnagogic hallucinations or sleep paralysis.  He denies any residual daytime sleepiness.  An Epworth Sleepiness Scale score was calculated in the office today and this endorsed at 5 arguing against residual daytime sleepiness.  He typically goes to bed between 11 and 1130 each night and wakes up between 7 and 8 AM.  When I initially saw him he was on diltiazem 240 mg, metoprolol 25 mg daily and telmisartan 80 mg for hypertension as well as his PAF and  Eliquis anticoagulation.  He wason atorvastatin 20 mg daily for hyperlipidemia.  I last saw him on September 12, 2020.  Over the prior year he continued to do well.  He has been using CPAP with continued excellent compliance.  I obtained a new download from October 2 through September 11, 2020.  He was averaging 9 hours and 25 minutes of CPAP use per night.  AHI was 1.8 at a 13 cm CPAP pressure.  He has a Microbiologist F 30i mask.  There was no leak with the exception of one night.  He believes he is sleeping well.  He will be is going to see his son who is currently living in [place],  [place].  He was hospitalized from October 1 through August 14, 2020 with increasing shortness of breath in the setting of low hemoglobin.  Occult blood was negative and colonoscopy did not reveal any source of bleeding.  He was maintaining sinus rhythm I did not have recurrent atrial fibrillation.  During his hospitalization telmisartan was held in the hospital but resumed at discharge and metoprolol was continued.  Since I last saw him, he has continued to see Dr. Alain Marion for his primary care.  He has a history of Barrett's esophagus and also has had bleeding ulcer contributing to anemia.  Over the past several weeks he has noticed increasing shortness of breath and mild edema right greater than left ankle.   He has a prescription for furosemide 40 mg but has been taking this only as needed.  He continues to be on metoprolol succinate 25 mg daily, spironolactone 25 mg and telmisartan 20 mg daily.  He has a history of PAF and continues to be on low-dose Eliquis 2.5 mg twice a day and has been maintaining sinus rhythm.  He continues to use CPAP with excellent compliance.  A download was obtained from October 17, 2021 through November 15, 2021.  Usage days was 97% with usage daily at 8 hours and 36 minutes.  AHI was 0.6 at his 13 cm set pressure.  With therapy there is no snoring.  His previous nocturia had improved from 4-5 times per night to 0.  Epworth Sleepiness Scale score was calculated in the office today and this endorsed at 4 arguing against residual daytime sleepiness.  He presents for evaluation.   Past Medical History:  Diagnosis Date   Allergic rhinitis    Anemia, iron deficiency    Arthritis    Atrial arrhythmia    Atrial fib/flutter, transient    following prior surgeries x 2   Barrett's esophagus without dysplasia    BPH (benign prostatic hyperplasia)    Bright's disease    Chronic LBP    Closed fracture of right distal radius    DDD (degenerative disc disease)    Diverticulosis of colon    Duodenal stricture    Gastric outlet obstruction    GERD (gastroesophageal reflux disease)    Glaucoma    Hemorrhoids    History of kidney stones    Hyperlipidemia    Hyperplastic colonic polyp 01/2000   Hypertension    Nephrolithiasis    hx of B   Peptic ulcer disease with hemorrhage 08/2008   and [facility]. Pylori Ab negative   Renal cyst    Sleep apnea with use of continuous positive airway pressure (CPAP)    Tubular adenoma of colon 03/2012   Wears glasses     Past Surgical History:  Procedure Laterality Date   APPENDECTOMY     BACK SURGERY  02/2003   L5   BILROTH I PROCEDURE  2011   Dr Johney Maine   CLOSED REDUCTION WRIST FRACTURE Right 01/06/2019   Procedure: Closed Reduction  Wrist/Pinning Wrist;  Surgeon: Dayna Barker, MD;  Location: [facility];  Service: Plastics;  Laterality: Right;   COLONOSCOPY     COLONOSCOPY WITH PROPOFOL N/A 08/14/2020   Procedure: COLONOSCOPY WITH PROPOFOL;  Surgeon: [name], MD;  Location: [facility] ENDOSCOPY;  Service: Endoscopy;  Laterality: N/A;   HIATAL HERNIA REPAIR     LAPAROTOMY N/A 05/10/2017   Procedure: EXPLORATORY LAPAROTOMY WITH ENTEROLYSIS;  Surgeon: Johnathan Hausen, MD;  Location: [facility];  Service: General;  Laterality: N/A;  LUMBAR FUSION  2009   SMALL INTESTINE SURGERY     TONSILLECTOMY     UPPER GASTROINTESTINAL ENDOSCOPY  07/26/2020    Current Medications: Outpatient Medications Prior to Visit  Medication Sig Dispense Refill   acetaminophen (TYLENOL) 500 MG tablet Take 1,000 mg by mouth every 6 (six) hours as needed for mild pain or moderate pain. Maximum daily dose of 3g     atorvastatin (LIPITOR) 20 MG tablet TAKE 1 TABLET BY MOUTH EVERY DAY 90 tablet 3   cetirizine (ZYRTEC) 10 MG tablet Take 10 mg by mouth daily.     ELIQUIS 2.5 MG TABS tablet TAKE 1 TABLET BY MOUTH TWICE A DAY 180 tablet 3   famotidine (PEPCID) 40 MG tablet Take 1 tablet (40 mg total) by mouth at bedtime. 30 tablet 11   fluticasone (FLONASE) 50 MCG/ACT nasal spray SPRAY 2 SPRAYS INTO EACH NOSTRIL EVERY DAY 48 mL 3   furosemide (LASIX) 40 MG tablet Take 40 mg by mouth daily as needed.     latanoprost (XALATAN) 0.005 % ophthalmic solution Place 1 drop into both eyes at bedtime.  11   metoprolol succinate (TOPROL-XL) 25 MG 24 hr tablet TAKE 1 TABLET BY MOUTH EVERY DAY 90 tablet 2   oxyCODONE (ROXICODONE) 15 MG immediate release tablet Take 1 tablet (15 mg total) by mouth 4 (four) times daily. 120 tablet 0   oxyCODONE (ROXICODONE) 15 MG immediate release tablet Take 1 tablet (15 mg total) by mouth 4 (four) times daily. 120 tablet 0   oxyCODONE (ROXICODONE) 15 MG immediate release tablet Take 1 tablet (15 mg total) by mouth 4 (four) times daily. 120  tablet 0   pantoprazole (PROTONIX) 40 MG tablet TAKE 1 TABLET BY MOUTH TWICE A DAY 180 tablet 0   polyethylene glycol (MIRALAX / GLYCOLAX) packet Take 17 g by mouth daily. 14 each 0   PRESCRIPTION MEDICATION Inhale into the lungs at bedtime. CPAP     spironolactone (ALDACTONE) 25 MG tablet TAKE 1 TABLET BY MOUTH EVERY DAY 90 tablet 3   telmisartan (MICARDIS) 20 MG tablet Take 1 tablet (20 mg total) by mouth daily. 90 tablet 3   timolol (TIMOPTIC) 0.25 % ophthalmic solution Place 1 drop into both eyes 2 (two) times daily.      triamcinolone cream (KENALOG) 0.1 % Apply 1 application topically 3 (three) times daily. 80 g 3   No facility-administered medications prior to visit.     Allergies:   Codeine phosphate, Nsaids, and Adhesive [tape]   Social History   Socioeconomic History   Marital status: Married    Spouse name: Jake Fuhrmann   Number of children: 1   Years of education: Not on file   Highest education level: Not on file  Occupational History   Occupation: PASTOR    Employer: [employer]  Tobacco Use   Smoking status: Former   Smokeless tobacco: Never   Tobacco comments:    as a teenager  Vaping Use   Vaping Use: Never used  Substance and Sexual Activity   Alcohol use: No   Drug use: No   Sexual activity: Yes  Other Topics Concern   Not on file  Social History Narrative   Patient gets regular exercise   Married x 42 years   Social Determinants of Health   Financial Resource Strain: Low Risk    Difficulty of Paying Living Expenses: Not hard at all  Food Insecurity: No Food Insecurity   Worried About Running Out of  Food in the Last Year: Never true   [place] in the Last Year: Never true  Transportation Needs: No Transportation Needs   Lack of Transportation (Medical): No   Lack of Transportation (Non-Medical): No  Physical Activity: Inactive   Days of Exercise per Week: 0 days   Minutes of Exercise per Session: 0 min  Stress: No Stress  Concern Present   Feeling of Stress : Not at all  Social Connections: Socially Integrated   Frequency of Communication with Friends and Family: More than three times a week   Frequency of Social Gatherings with Friends and Family: More than three times a week   Attends Religious Services: More than 4 times per year   Active Member of [employer] or Organizations: Yes   Attends Music therapist: More than 4 times per year   Marital Status: Married     Family History:  The patient's family history includes Colon cancer in his cousin; Colon cancer (age of onset: 67) in his paternal uncle and paternal uncle; Colon cancer (age of onset: 43) in his father; Colon cancer (age of onset: 28) in his mother; Colon polyps in his father and mother; Drug abuse in his sister; Heart disease in his mother; Hypertension in an other family member; Prostate cancer in his father; Stroke in his father.   ROS General: Negative; No fevers, chills, or night sweats;  HEENT: Negative; No changes in vision or hearing, sinus congestion, difficulty swallowing Pulmonary: Negative; No cough, wheezing, shortness of breath, hemoptysis Cardiovascular: Negative; No chest pain, presyncope, syncope, palpitations GI: Negative; No nausea, vomiting, diarrhea, or abdominal pain GU: Negative; No dysuria, hematuria, or difficulty voiding Musculoskeletal: He is status post 2 back surgeries making walking difficult; he admits to back discomfort Hematologic/Oncology: Negative; no easy bruising, bleeding Endocrine: Negative; no heat/cold intolerance; no diabetes Neuro: Negative; no changes in balance, headaches Skin: Negative; No rashes or skin lesions Psychiatric: Negative; No behavioral problems, depression Sleep: Positive for OSA on CPAP therapy with set up date May 25, 2019, [facility] as DME company.  Admits to feeling well with marked improvement since CPAP initiation.  No breakthrough snoring.  No residual daytime  sleepiness, nocturia has improved from 4-5 times per night to 0; no bruxism, restless legs, hypnogognic hallucinations, no cataplexy Other comprehensive 14 point system review is negative.   PHYSICAL EXAM:   VS:  BP 138/82    Pulse 73    Ht '5\' 5"'  (1.651 m)    Wt 238 lb 9.6 oz (108.2 kg)    SpO2 95%    BMI 39.71 kg/m     Repeat blood pressure by me was 142/86.  Wt Readings from Last 3 Encounters:  11/20/21 238 lb 9.6 oz (108.2 kg)  10/16/21 243 lb 3.2 oz (110.3 kg)  07/13/21 236 lb 12.8 oz (107.4 kg)    General: Alert, oriented, no distress.  Skin: normal turgor, no rashes, warm and dry HEENT: Normocephalic, atraumatic. Pupils equal round and reactive to light; sclera anicteric; extraocular muscles intact; F Nose without nasal septal hypertrophy Mouth/Parynx benign; Mallinpatti scale 3/4 Neck: No JVD, no carotid bruits; normal carotid upstroke Lungs: clear to ausculatation and percussion; no wheezing or rales Chest wall: without tenderness to palpitation Heart: PMI not displaced, RRR, s1 s2 normal, 1/6 systolic murmur, no diastolic murmur, no rubs, gallops, thrills, or heaves Abdomen: soft, nontender; no hepatosplenomehaly, BS+; abdominal aorta nontender and not dilated by palpation. Back: no CVA tenderness Pulses 2+ Musculoskeletal:  full range of motion, normal strength, no joint deformities Extremities: 1+ ankle edema right greater than left;  no clubbing cyanosis or edema, Homan's sign negative  Neurologic: grossly nonfocal; Cranial nerves grossly wnl Psychologic: Normal mood and affect   Studies/Labs Reviewed:   November 20, 2021 ECG (independently read by me): NSR at 73     September 12, 2020 ECG (independently read by me): NSR at 83, normal intervals  January 13, 2019 ECG (independently read by me): Normal sinus rhythm at 84 bpm.  PR interval 202 ms.  No ectopy.  Recent Labs: BMP Latest Ref Rng & Units 07/13/2021 03/24/2021 10/18/2020  Glucose 70 - 99 mg/dL 98 136(H) 104(H)  BUN  6 - 23 mg/dL 27(H) 18 26  Creatinine 0.40 - 1.50 mg/dL 1.33 1.21 1.44(H)  BUN/Creat Ratio 10 - 24 - 15 18  Sodium 135 - 145 mEq/L 137 140 139  Potassium 3.5 - 5.1 mEq/L 5.3 No hemolysis seen(H) 5.2 4.8  Chloride 96 - 112 mEq/L 103 102 101  CO2 19 - 32 mEq/L '27 21 22  ' Calcium 8.4 - 10.5 mg/dL 10.3 10.5(H) 9.8     Hepatic Function Latest Ref Rng & Units 07/13/2021 08/13/2020 08/12/2020  Total Protein 6.0 - 8.3 g/dL 7.3 5.4(L) 5.8(L)  Albumin 3.5 - 5.2 g/dL 4.2 2.8(L) 3.0(L)  AST 0 - 37 U/L '14 28 15  ' ALT 0 - 53 U/L '12 7 10  ' Alk Phosphatase 39 - 117 U/L 118(H) 69 84  Total Bilirubin 0.2 - 1.2 mg/dL 0.5 0.9 0.6  Bilirubin, Direct 0.0 - 0.2 mg/dL - - -    CBC Latest Ref Rng & Units 07/13/2021 03/24/2021 09/20/2020  WBC 4.0 - 10.5 K/uL 6.3 6.5 7.8  Hemoglobin 13.0 - 17.0 g/dL 13.1 13.9 13.0  Hematocrit 39.0 - 52.0 % 39.7 41.5 40.1  Platelets 150.0 - 400.0 K/uL 287.0 298 317   Lab Results  Component Value Date   MCV 95.1 07/13/2021   MCV 95 03/24/2021   MCV 89 09/20/2020   Lab Results  Component Value Date   TSH 0.96 07/13/2021   Lab Results  Component Value Date   HGBA1C 5.7 07/13/2021     BNP    Component Value Date/Time   BNP 84.0 07/29/2020 1626    ProBNP No results found for: PROBNP   Lipid Panel     Component Value Date/Time   CHOL 196 11/20/2018 1532   TRIG 269.0 (H) 11/20/2018 1532   TRIG 231 (HH) 09/13/2006 0957   HDL 43.60 11/20/2018 1532   CHOLHDL 4 11/20/2018 1532   VLDL 53.8 (H) 11/20/2018 1532   LDLCALC 86 11/25/2017 1021   LDLDIRECT 109.0 11/20/2018 1532     RADIOLOGY: No results found.   Additional studies/ records that were reviewed today include:  I reviewed the patient's office evaluations, echo Doppler study, recent home sleep study, subsequent CPAP titration study, and obtained a download in the office today.  I reviewed the patient's recent hospitalization from early October 2021.    Epworth Sleepiness Scale does not demonstrate any  residual daytime sleepiness. ASSESSMENT:    No diagnosis found.   PLAN:  Cody Frazier is a 76 year-old retired [employer]  who has a history of hypertension, hyperlipidemia, as well as paroxysmal atrial fibrillation.  Due to concerns for obstructive sleep apnea with significant snoring, waking up gasping for breath, frequent nocturia, he was referred for sleep study and was found to have severe obstructive sleep apnea (AHI of 50.9/h and oxygen  nadir at 75%.)  His CPAP titration study was performed and at 13 cm water pressure AHI was 0 and O2 nadir 91%.  There was resolution of snoring.  Since his CPAP initiation, he is continue to notice a dramatic improvement in his sleep quality.  He is unaware of any breakthrough snoring.  His nocturia which was 4-5 times per night is essentially 0.  He denies any residual daytime sleepiness.  Compliance has remained excellent and has been maintained at 13 cm water pressure with excellent AHI.  On his most recent download from October 17, 2021 through November 15, 2021 AHI was 0.6 and usage was 8 hours and 36 minutes per night.  Recently has experienced increasing shortness of breath for the last several weeks.  Usually this is worse in the morning but improves as the day goes on.  He also has noticed some mild lower extremity edema right greater than left ankle.  I have recommended for the next 3 days he take Lasix 40 mg daily and then changed to every other day and if he continues to do well then return back to as needed.  I have recommended follow-up, prehensile metabolic panel, and CBC.  He is maintaining sinus rhythm and continues to be on low-dose Eliquis.  There is a history of Barrett's esophagus as well as remote bleeding ulcer contributing to prior anemia.  His last echo Doppler evaluation was in September 2021.  I have suggested that he have a follow-up echo Doppler study prior to his next appointment to see Dr. Sallyanne Kuster.  From a sleep perspective,  he is remained stable.  I will see him in 1 year for reevaluation or sooner as needed.   Medication Adjustments/Labs and Tests Ordered: Current medicines are reviewed at length with the patient today.  Concerns regarding medicines are outlined above.  Medication changes, Labs and Tests ordered today are listed in the Patient Instructions below. There are no Patient Instructions on file for this visit.   Signed, Shelva Majestic, MD  11/20/2021 12:34 PM    [facility] [address], [place], [place], [place]  [address] Phone: [phone]

## 2021-11-21 LAB — COMPREHENSIVE METABOLIC PANEL
ALT: 12 IU/L (ref 0–44)
AST: 16 IU/L (ref 0–40)
Albumin/Globulin Ratio: 1.8 (ref 1.2–2.2)
Albumin: 4.3 g/dL (ref 3.7–4.7)
Alkaline Phosphatase: 126 IU/L — ABNORMAL HIGH (ref 44–121)
BUN/Creatinine Ratio: 16 (ref 10–24)
BUN: 18 mg/dL (ref 8–27)
Bilirubin Total: 0.5 mg/dL (ref 0.0–1.2)
CO2: 21 mmol/L (ref 20–29)
Calcium: 10.3 mg/dL — ABNORMAL HIGH (ref 8.6–10.2)
Chloride: 105 mmol/L (ref 96–106)
Creatinine, Ser: 1.12 mg/dL (ref 0.76–1.27)
Globulin, Total: 2.4 g/dL (ref 1.5–4.5)
Glucose: 97 mg/dL (ref 70–99)
Potassium: 5.1 mmol/L (ref 3.5–5.2)
Sodium: 141 mmol/L (ref 134–144)
Total Protein: 6.7 g/dL (ref 6.0–8.5)
eGFR: 68 mL/min/{1.73_m2} (ref >59)

## 2021-11-21 LAB — CBC
Hematocrit: 39.7 % (ref 37.5–51.0)
Hemoglobin: 13.4 g/dL (ref 13.0–17.7)
MCH: 31.4 pg (ref 26.6–33.0)
MCHC: 33.8 g/dL (ref 31.5–35.7)
MCV: 93 fL (ref 79–97)
Platelets: 312 10*3/uL (ref 150–450)
RBC: 4.27 x10E6/uL (ref 4.14–5.80)
RDW: 11.7 % (ref 11.6–15.4)
WBC: 6.2 10*3/uL (ref 3.4–10.8)

## 2021-11-22 ENCOUNTER — Other Ambulatory Visit: Payer: Self-pay

## 2021-11-22 ENCOUNTER — Ambulatory Visit (HOSPITAL_COMMUNITY): Payer: Medicare Other | Attending: Cardiology

## 2021-11-22 DIAGNOSIS — R0609 Other forms of dyspnea: Secondary | ICD-10-CM | POA: Diagnosis not present

## 2021-11-22 LAB — ECHOCARDIOGRAM COMPLETE
Area-P 1/2: 1.61 cm2
S' Lateral: 2.2 cm

## 2021-12-03 ENCOUNTER — Encounter: Payer: Self-pay | Admitting: Cardiovascular Disease

## 2021-12-12 DIAGNOSIS — I10 Essential (primary) hypertension: Secondary | ICD-10-CM | POA: Diagnosis not present

## 2021-12-12 DIAGNOSIS — I4891 Unspecified atrial fibrillation: Secondary | ICD-10-CM

## 2021-12-12 DIAGNOSIS — E785 Hyperlipidemia, unspecified: Secondary | ICD-10-CM | POA: Diagnosis not present

## 2021-12-12 DIAGNOSIS — H409 Unspecified glaucoma: Secondary | ICD-10-CM | POA: Diagnosis not present

## 2021-12-14 ENCOUNTER — Other Ambulatory Visit: Payer: Self-pay | Admitting: Cardiovascular Disease

## 2021-12-25 ENCOUNTER — Ambulatory Visit (INDEPENDENT_AMBULATORY_CARE_PROVIDER_SITE_OTHER): Payer: Medicare Other

## 2021-12-25 ENCOUNTER — Other Ambulatory Visit: Payer: Self-pay

## 2021-12-25 VITALS — BP 118/60 | HR 80 | Temp 97.6°F | Ht 66.0 in | Wt 241.2 lb

## 2021-12-25 DIAGNOSIS — Z0000 Encounter for general adult medical examination without abnormal findings: Secondary | ICD-10-CM | POA: Diagnosis not present

## 2021-12-25 NOTE — Progress Notes (Addendum)
Subjective:   Cody Frazier is a 76 y.o. male who presents for Medicare Annual/Subsequent preventive examination.  Review of Systems     Cardiac Risk Factors include: advanced age (>72men, >60 women);dyslipidemia;family history of premature cardiovascular disease;hypertension;male gender;obesity (BMI >30kg/m2)     Objective:    Today's Vitals   12/25/21 1308 12/25/21 1351  BP: 118/60   Pulse: 80   Temp: 97.6 F (36.4 C)   SpO2: 98%   Weight: 241 lb 3.2 oz (109.4 kg)   Height: 5\' 6"  (1.676 m)   PainSc: 6  6    Body mass index is 38.93 kg/m.  Advanced Directives 12/25/2021 12/21/2020 08/14/2020 08/13/2020 08/12/2020 04/30/2019 12/26/2018  Does Patient Have a Medical Advance Directive? Yes Yes Yes Yes Yes No Yes  Type of Advance Directive Living will;Healthcare Power of Attorney Living will;Healthcare Power of [place];Living will - [place];Living will Living will  Does patient want to make changes to medical advance directive? No - Patient declined No - Patient declined - No - Patient declined - - -  Copy of [place] in Chart? No - copy requested - - No - copy requested - No - copy requested -    Current Medications (verified) Outpatient Encounter Medications as of 12/25/2021  Medication Sig   acetaminophen (TYLENOL) 500 MG tablet Take 1,000 mg by mouth every 6 (six) hours as needed for mild pain or moderate pain. Maximum daily dose of 3g   atorvastatin (LIPITOR) 20 MG tablet TAKE 1 TABLET BY MOUTH EVERY DAY   cetirizine (ZYRTEC) 10 MG tablet Take 10 mg by mouth daily.   ELIQUIS 2.5 MG TABS tablet TAKE 1 TABLET BY MOUTH TWICE A DAY   famotidine (PEPCID) 40 MG tablet Take 1 tablet (40 mg total) by mouth at bedtime.   fluticasone (FLONASE) 50 MCG/ACT nasal spray SPRAY 2 SPRAYS INTO EACH NOSTRIL EVERY DAY   furosemide (LASIX) 40 MG tablet Take 1 tablet (40 mg total) by mouth daily as needed for edema. TAKE 40MG   DAILY x3 DAYS THEN TAKE 40MG  EVERY OTHER DAY THEN BACK TO PRN   latanoprost (XALATAN) 0.005 % ophthalmic solution Place 1 drop into both eyes at bedtime.   metoprolol succinate (TOPROL-XL) 25 MG 24 hr tablet TAKE 1 TABLET BY MOUTH EVERY DAY   oxyCODONE (ROXICODONE) 15 MG immediate release tablet Take 1 tablet (15 mg total) by mouth 4 (four) times daily.   oxyCODONE (ROXICODONE) 15 MG immediate release tablet Take 1 tablet (15 mg total) by mouth 4 (four) times daily.   oxyCODONE (ROXICODONE) 15 MG immediate release tablet Take 1 tablet (15 mg total) by mouth 4 (four) times daily.   pantoprazole (PROTONIX) 40 MG tablet TAKE 1 TABLET BY MOUTH TWICE A DAY   polyethylene glycol (MIRALAX / GLYCOLAX) packet Take 17 g by mouth daily.   PRESCRIPTION MEDICATION Inhale into the lungs at bedtime. CPAP   spironolactone (ALDACTONE) 25 MG tablet TAKE 1 TABLET BY MOUTH EVERY DAY   telmisartan (MICARDIS) 20 MG tablet TAKE 1 TABLET BY MOUTH EVERY DAY   timolol (TIMOPTIC) 0.25 % ophthalmic solution Place 1 drop into both eyes 2 (two) times daily.    triamcinolone cream (KENALOG) 0.1 % Apply 1 application topically 3 (three) times daily.   No facility-administered encounter medications on file as of 12/25/2021.    Allergies (verified) Codeine phosphate, Nsaids, and Adhesive [tape]   History: Past Medical History:  Diagnosis Date  Allergic rhinitis    Anemia, iron deficiency    Arthritis    Atrial arrhythmia    Atrial fib/flutter, transient    following prior surgeries x 2   Barrett's esophagus without dysplasia    BPH (benign prostatic hyperplasia)    Bright's disease    Chronic LBP    Closed fracture of right distal radius    DDD (degenerative disc disease)    Diverticulosis of colon    Duodenal stricture    Gastric outlet obstruction    GERD (gastroesophageal reflux disease)    Glaucoma    Hemorrhoids    History of kidney stones    Hyperlipidemia    Hyperplastic colonic polyp 01/2000    Hypertension    Nephrolithiasis    hx of B   Peptic ulcer disease with hemorrhage 08/2008   and [facility]. Pylori Ab negative   Renal cyst    Sleep apnea with use of continuous positive airway pressure (CPAP)    Tubular adenoma of colon 03/2012   Wears glasses    Past Surgical History:  Procedure Laterality Date   APPENDECTOMY     BACK SURGERY  02/2003   L5   BILROTH I PROCEDURE  2011   Dr Johney Maine   CLOSED REDUCTION WRIST FRACTURE Right 01/06/2019   Procedure: Closed Reduction Wrist/Pinning Wrist;  Surgeon: Dayna Barker, MD;  Location: [place];  Service: Plastics;  Laterality: Right;   COLONOSCOPY     COLONOSCOPY WITH PROPOFOL N/A 08/14/2020   Procedure: COLONOSCOPY WITH PROPOFOL;  Surgeon: [name], MD;  Location: [facility] ENDOSCOPY;  Service: Endoscopy;  Laterality: N/A;   HIATAL HERNIA REPAIR     LAPAROTOMY N/A 05/10/2017   Procedure: EXPLORATORY LAPAROTOMY WITH ENTEROLYSIS;  Surgeon: Johnathan Hausen, MD;  Location: [facility];  Service: General;  Laterality: N/A;   LUMBAR FUSION  2009   SMALL INTESTINE SURGERY     TONSILLECTOMY     UPPER GASTROINTESTINAL ENDOSCOPY  07/26/2020   Family History  Problem Relation Age of Onset   Colon polyps Mother    Colon cancer Mother 71   Heart disease Mother    Colon polyps Father    Colon cancer Father 65   Prostate cancer Father    Stroke Father    Colon cancer Paternal Uncle 81   Colon cancer Paternal Uncle 106   Hypertension Other    Colon cancer Cousin    Drug abuse Sister        overdose   Stomach cancer Neg Hx    Esophageal cancer Neg Hx    Pancreatic cancer Neg Hx    Liver disease Neg Hx    Social History   Socioeconomic History   Marital status: Married    Spouse name: Jabron Weese   Number of children: 1   Years of education: Not on file   Highest education level: Not on file  Occupational History   Occupation: PASTOR    Employer: [employer]  Tobacco Use   Smoking status: Former   Smokeless tobacco: Never    Tobacco comments:    as a teenager  Media planner   Vaping Use: Never used  Substance and Sexual Activity   Alcohol use: No   Drug use: No   Sexual activity: Yes  Other Topics Concern   Not on file  Social History Narrative   Patient gets regular exercise   Married x 42 years   Social Determinants of Health   Financial Resource Strain: Low Risk  Difficulty of Paying Living Expenses: Not hard at all  Food Insecurity: No Food Insecurity   Worried About [place] in the Last Year: Never true   Ran Out of Food in the Last Year: Never true  Transportation Needs: No Transportation Needs   Lack of Transportation (Medical): No   Lack of Transportation (Non-Medical): No  Physical Activity: Inactive   Days of Exercise per Week: 0 days   Minutes of Exercise per Session: 0 min  Stress: No Stress Concern Present   Feeling of Stress : Not at all  Social Connections: Socially Integrated   Frequency of Communication with Friends and Family: More than three times a week   Frequency of Social Gatherings with Friends and Family: More than three times a week   Attends Religious Services: More than 4 times per year   Active Member of [employer] or Organizations: Yes   Attends Music therapist: More than 4 times per year   Marital Status: Married    Tobacco Counseling Counseling given: Not Answered Tobacco comments: as a teenager   Clinical Intake:  Pre-visit preparation completed: Yes  Pain : 0-10 Pain Score: 6  Pain Type: Chronic pain Pain Location: Back Pain Orientation: Mid, Lower Pain Radiating Towards: left lower extremity Pain Descriptors / Indicators: Discomfort, Constant Pain Onset: More than a month ago Pain Frequency: Constant Pain Relieving Factors: Oxycodone Effect of Pain on Daily Activities: Pain produces disability and affects the quality of life.  Pain Relieving Factors: Oxycodone  BMI - recorded: 38.93 Nutritional Status: BMI > 30   Obese Nutritional Risks: None Diabetes: No  How often do you need to have someone help you when you read instructions, pamphlets, or other written materials from your doctor or pharmacy?: 1 - Never What is the last grade level you completed in school?: Doctoral Degree in Pastoral Counseling  Diabetic? no  Interpreter Needed?: No  Information entered by :: Lisette Abu, LPN   Activities of Daily Living In your present state of health, do you have any difficulty performing the following activities: 12/25/2021  Hearing? Y  Vision? N  Difficulty concentrating or making decisions? N  Walking or climbing stairs? Y  Dressing or bathing? N  Doing errands, shopping? N  Preparing Food and eating ? N  Using the Toilet? N  In the past six months, have you accidently leaked urine? N  Do you have problems with loss of bowel control? N  Managing your Medications? N  Managing your Finances? N  Housekeeping or managing your Housekeeping? N  Some recent data might be hidden    Patient Care Team: Plotnikov, Evie Lacks, MD as PCP - General Croitoru, Dani Gobble, MD as PCP - Cardiology (Cardiology) Leeroy Cha, MD (Neurosurgery) Michael Boston, MD (General Surgery) Ladene Artist, MD (Gastroenterology) Franchot Gallo, MD as Attending Physician (Urology) Jacolyn Reedy, MD as Consulting Physician (Cardiology) [facility] as Consulting Physician (Ophthalmology) Delice Bison Darnelle Maffucci, [facility] as Pharmacist (Pharmacist)  Indicate any recent Medical Services you may have received from other than [facility] providers in the past year (date may be approximate).     Assessment:   This is a routine wellness examination for [place].  Hearing/Vision screen Hearing Screening - Comments:: Patient has hearing difficulty; does not wear hearing aids. Vision Screening - Comments:: Patient wears corrective glasses/contacts.  Eye exam done annually by: [facility].  Dietary issues and  exercise activities discussed: Current Exercise Habits: The patient does not participate in regular  exercise at present, Exercise limited by: neurologic condition(s);respiratory conditions(s)   Goals Addressed             This Visit's Progress    Patient Stated       My goal is to get my heart problem stabilized.      Depression Screen PHQ 2/9 Scores 12/25/2021 12/21/2020 09/12/2020 02/22/2020 10/27/2018 05/19/2018 02/22/2017  PHQ - 2 Score 0 0 0 0 1 1 1   PHQ- 9 Score - - - - - - 1    Fall Risk Fall Risk  12/25/2021 12/21/2020 09/12/2020 05/23/2020 10/27/2018  Falls in the past year? 1 0 1 0 1  Number falls in past yr: 1 0 0 0 0  Injury with Fall? 0 0 0 0 0  Risk for fall due to : Impaired balance/gait;Other (Comment) Impaired balance/gait History of fall(s) - Impaired balance/gait;Impaired mobility  Risk for fall due to: Comment Neurosurgical patient - - - -  Follow up Falls evaluation completed - Falls evaluation completed - Falls prevention discussed;Education provided    FALL RISK PREVENTION PERTAINING TO THE HOME:  Any stairs in or around the home? Yes  If so, are there any without handrails? No  Home free of loose throw rugs in walkways, pet beds, electrical cords, etc? Yes  Adequate lighting in your home to reduce risk of falls? Yes   ASSISTIVE DEVICES UTILIZED TO PREVENT FALLS:  Life alert? No  Use of a cane, walker or w/c? Yes  Grab bars in the bathroom? Yes  Shower chair or bench in shower? Yes  Elevated toilet seat or a handicapped toilet? Yes   TIMED UP AND GO:  Was the test performed? Yes .  Length of time to ambulate 10 feet: 8 sec.   Gait steady and fast with assistive device  Cognitive Function: Normal cognitive status assessed by direct observation by this Nurse Health Advisor. No abnormalities found.   MMSE - Mini Mental State Exam 02/22/2017  Orientation to time 5  Orientation to Place 5  Registration 3  Attention/ Calculation 3  Recall 3  Language-  name 2 objects 2  Language- repeat 1  Language- follow 3 step command 3  Language- read & follow direction 1  Write a sentence 1  Copy design 1  Total score 28        Immunizations Immunization History  Administered Date(s) Administered   DT (Pediatric) 02/09/2011   Fluad Quad(high Dose 65+) 08/14/2020   Influenza Split 08/13/2011, 08/19/2012   Influenza Whole 09/26/2009, 08/01/2010   Influenza, High Dose Seasonal PF 08/25/2013, 10/04/2015, 08/22/2016, 08/23/2017, 08/19/2018, 07/27/2019   Influenza,inj,Quad PF,6+ Mos 08/25/2014   Influenza-Unspecified 08/12/2021   PFIZER(Purple Top)SARS-COV-2 Vaccination 12/09/2019, 12/30/2019, 08/28/2020, 08/12/2021   Pneumococcal Conjugate-13 11/22/2014   Pneumococcal Polysaccharide-23 11/19/2011   Td 05/26/2015   Tdap 12/26/2018   Zoster, Live 02/17/2008    TDAP status: Up to date  Flu Vaccine status: Up to date  Pneumococcal vaccine status: Up to date  Covid-19 vaccine status: Completed vaccines  Qualifies for Shingles Vaccine? Yes   Zostavax completed Yes   Shingrix Completed?: No.    Education has been provided regarding the importance of this vaccine. Patient has been advised to call insurance company to determine out of pocket expense if they have not yet received this vaccine. Advised may also receive vaccine at local pharmacy or Health Dept. Verbalized acceptance and understanding.  Screening Tests Health Maintenance  Topic Date Due   Hepatitis C Screening  Never done  COVID-19 Vaccine (5 - Booster for Pfizer series) 10/07/2021   Zoster Vaccines- Shingrix (1 of 2) 02/13/2022 (Originally 09/11/1965)   COLONOSCOPY (Pts 45-35yrs Insurance coverage will need to be confirmed)  08/14/2025   TETANUS/TDAP  12/26/2028   Pneumonia Vaccine 75+ Years old  Completed   INFLUENZA VACCINE  Completed   HPV VACCINES  Aged Out    Health Maintenance  Health Maintenance Due  Topic Date Due   Hepatitis C Screening  Never done    COVID-19 Vaccine (5 - Booster for [place] series) 10/07/2021    Colorectal cancer screening: Type of screening: Colonoscopy. Completed 08/14/2020. Repeat every 5 years  Lung Cancer Screening: (Low Dose CT Chest recommended if Age 48-80 years, 30 pack-year currently smoking OR have quit w/in 15years.) does not qualify.   Lung Cancer Screening Referral: no  Additional Screening:  Hepatitis C Screening: does not qualify; Completed no  Vision Screening: Recommended annual ophthalmology exams for early detection of glaucoma and other disorders of the eye. Is the patient up to date with their annual eye exam?  Yes  Who is the provider or what is the name of the office in which the patient attends annual eye exams? [facility] If pt is not established with a provider, would they like to be referred to a provider to establish care? No .   Dental Screening: Recommended annual dental exams for proper oral hygiene  Community Resource Referral / Chronic Care Management: CRR required this visit?  No   CCM required this visit?  No      Plan:     I have personally reviewed and noted the following in the patients chart:   Medical and social history Use of alcohol, tobacco or illicit drugs  Current medications and supplements including opioid prescriptions. Patient is currently taking opioid prescriptions. Information provided to patient regarding non-opioid alternatives. Patient advised to discuss non-opioid treatment plan with their provider. Functional ability and status Nutritional status Physical activity Advanced directives List of other physicians Hospitalizations, surgeries, and ER visits in previous 12 months Vitals Screenings to include cognitive, depression, and falls Referrals and appointments  In addition, I have reviewed and discussed with patient certain preventive protocols, quality metrics, and best practice recommendations. A written personalized care plan for  preventive services as well as general preventive health recommendations were provided to patient.     Sheral Flow, LPN   [id number]   Nurse Notes:  Hearing Screening - Comments:: Patient has hearing difficulty; does not wear hearing aids. Vision Screening - Comments:: Patient wears corrective glasses/contacts.  Eye exam done annually by: [facility].  Medical screening examination/treatment/procedure(s) were performed by non-physician practitioner and as supervising physician I was immediately available for consultation/collaboration.  I agree with above. Lew Dawes, MD

## 2021-12-25 NOTE — Patient Instructions (Signed)
[name] , Thank you for taking time to come for your Medicare Wellness Visit. I appreciate your ongoing commitment to your health goals. Please review the following plan we discussed and let me know if I can assist you in the future.   Screening recommendations/referrals: Colonoscopy: 08/14/2020; due every 5 years Recommended yearly ophthalmology/optometry visit for glaucoma screening and checkup Recommended yearly dental visit for hygiene and checkup  Vaccinations: Influenza vaccine: 08/12/2021 Pneumococcal vaccine: 11/19/2011, 11/22/2014 Tdap vaccine: 12/26/2018; due every 10 years Shingles vaccine: never done   Covid-19: 12/09/2019, 12/30/2019, 08/28/2020, 08/12/2021  Advanced directives: Please bring a copy of your health care power of attorney and living will to the office at your convenience.  Conditions/risks identified: Yes; get my heart problem stabilized.  Next appointment: Please schedule your next Medicare Wellness Visit with your Nurse Health Advisor in 1 year by calling [phone].  Preventive Care 77 Years and Older, Male Preventive care refers to lifestyle choices and visits with your health care provider that can promote health and wellness. What does preventive care include? A yearly physical exam. This is also called an annual well check. Dental exams once or twice a year. Routine eye exams. Ask your health care provider how often you should have your eyes checked. Personal lifestyle choices, including: Daily care of your teeth and gums. Regular physical activity. Eating a healthy diet. Avoiding tobacco and drug use. Limiting alcohol use. Practicing safe sex. Taking low doses of aspirin every day. Taking vitamin and mineral supplements as recommended by your health care provider. What happens during an annual well check? The services and screenings done by your health care provider during your annual well check will depend on your age, overall health, lifestyle  risk factors, and family history of disease. Counseling  Your health care provider may ask you questions about your: Alcohol use. Tobacco use. Drug use. Emotional well-being. Home and relationship well-being. Sexual activity. Eating habits. History of falls. Memory and ability to understand (cognition). Work and work Statistician. Screening  You may have the following tests or measurements: Height, weight, and BMI. Blood pressure. Lipid and cholesterol levels. These may be checked every 5 years, or more frequently if you are over 51 years old. Skin check. Lung cancer screening. You may have this screening every year starting at age 93 if you have a 30-pack-year history of smoking and currently smoke or have quit within the past 15 years. Fecal occult blood test (FOBT) of the stool. You may have this test every year starting at age 58. Flexible sigmoidoscopy or colonoscopy. You may have a sigmoidoscopy every 5 years or a colonoscopy every 10 years starting at age 58. Prostate cancer screening. Recommendations will vary depending on your family history and other risks. Hepatitis C blood test. Hepatitis B blood test. Sexually transmitted disease (STD) testing. Diabetes screening. This is done by checking your blood sugar (glucose) after you have not eaten for a while (fasting). You may have this done every 1-3 years. Abdominal aortic aneurysm (AAA) screening. You may need this if you are a current or former smoker. Osteoporosis. You may be screened starting at age 75 if you are at high risk. Talk with your health care provider about your test results, treatment options, and if necessary, the need for more tests. Vaccines  Your health care provider may recommend certain vaccines, such as: Influenza vaccine. This is recommended every year. Tetanus, diphtheria, and acellular pertussis (Tdap, Td) vaccine. You may need a Td booster every 10 years.  Zoster vaccine. You may need this after age  30. Pneumococcal 13-valent conjugate (PCV13) vaccine. One dose is recommended after age 30. Pneumococcal polysaccharide (PPSV23) vaccine. One dose is recommended after age 40. Talk to your health care provider about which screenings and vaccines you need and how often you need them. This information is not intended to replace advice given to you by your health care provider. Make sure you discuss any questions you have with your health care provider. Document Released: 11/25/2015 Document Revised: 07/18/2016 Document Reviewed: 08/30/2015 Elsevier Interactive Patient Education  2017 [place] Prevention in the Home Falls can cause injuries. They can happen to people of all ages. There are many things you can do to make your home safe and to help prevent falls. What can I do on the outside of my home? Regularly fix the edges of walkways and driveways and fix any cracks. Remove anything that might make you trip as you walk through a door, such as a raised step or threshold. Trim any bushes or trees on the path to your home. Use bright outdoor lighting. Clear any walking paths of anything that might make someone trip, such as rocks or tools. Regularly check to see if handrails are loose or broken. Make sure that both sides of any steps have handrails. Any raised decks and porches should have guardrails on the edges. Have any leaves, snow, or ice cleared regularly. Use sand or salt on walking paths during winter. Clean up any spills in your garage right away. This includes oil or grease spills. What can I do in the bathroom? Use night lights. Install grab bars by the toilet and in the tub and shower. Do not use towel bars as grab bars. Use non-skid mats or decals in the tub or shower. If you need to sit down in the shower, use a plastic, non-slip stool. Keep the floor dry. Clean up any water that spills on the floor as soon as it happens. Remove soap buildup in the tub or shower  regularly. Attach bath mats securely with double-sided non-slip rug tape. Do not have throw rugs and other things on the floor that can make you trip. What can I do in the bedroom? Use night lights. Make sure that you have a light by your bed that is easy to reach. Do not use any sheets or blankets that are too big for your bed. They should not hang down onto the floor. Have a firm chair that has side arms. You can use this for support while you get dressed. Do not have throw rugs and other things on the floor that can make you trip. What can I do in the kitchen? Clean up any spills right away. Avoid walking on wet floors. Keep items that you use a lot in easy-to-reach places. If you need to reach something above you, use a strong step stool that has a grab bar. Keep electrical cords out of the way. Do not use floor polish or wax that makes floors slippery. If you must use wax, use non-skid floor wax. Do not have throw rugs and other things on the floor that can make you trip. What can I do with my stairs? Do not leave any items on the stairs. Make sure that there are handrails on both sides of the stairs and use them. Fix handrails that are broken or loose. Make sure that handrails are as long as the stairways. Check any carpeting to make sure that  it is firmly attached to the stairs. Fix any carpet that is loose or worn. Avoid having throw rugs at the top or bottom of the stairs. If you do have throw rugs, attach them to the floor with carpet tape. Make sure that you have a light switch at the top of the stairs and the bottom of the stairs. If you do not have them, ask someone to add them for you. What else can I do to help prevent falls? Wear shoes that: Do not have high heels. Have rubber bottoms. Are comfortable and fit you well. Are closed at the toe. Do not wear sandals. If you use a stepladder: Make sure that it is fully opened. Do not climb a closed stepladder. Make sure that  both sides of the stepladder are locked into place. Ask someone to hold it for you, if possible. Clearly mark and make sure that you can see: Any grab bars or handrails. First and last steps. Where the edge of each step is. Use tools that help you move around (mobility aids) if they are needed. These include: Canes. Walkers. Scooters. Crutches. Turn on the lights when you go into a dark area. Replace any light bulbs as soon as they burn out. Set up your furniture so you have a clear path. Avoid moving your furniture around. If any of your floors are uneven, fix them. If there are any pets around you, be aware of where they are. Review your medicines with your doctor. Some medicines can make you feel dizzy. This can increase your chance of falling. Ask your doctor what other things that you can do to help prevent falls. This information is not intended to replace advice given to you by your health care provider. Make sure you discuss any questions you have with your health care provider. Document Released: 08/25/2009 Document Revised: 04/05/2016 Document Reviewed: 12/03/2014 Elsevier Interactive Patient Education  2017 [employer].

## 2021-12-31 ENCOUNTER — Other Ambulatory Visit: Payer: Self-pay | Admitting: Internal Medicine

## 2021-12-31 ENCOUNTER — Other Ambulatory Visit: Payer: Self-pay | Admitting: Cardiovascular Disease

## 2021-12-31 ENCOUNTER — Other Ambulatory Visit: Payer: Self-pay | Admitting: Gastroenterology

## 2021-12-31 DIAGNOSIS — K2270 Barrett's esophagus without dysplasia: Secondary | ICD-10-CM

## 2021-12-31 DIAGNOSIS — D508 Other iron deficiency anemias: Secondary | ICD-10-CM

## 2022-01-01 ENCOUNTER — Telehealth: Payer: Self-pay | Admitting: Gastroenterology

## 2022-01-01 DIAGNOSIS — D508 Other iron deficiency anemias: Secondary | ICD-10-CM

## 2022-01-01 DIAGNOSIS — K2270 Barrett's esophagus without dysplasia: Secondary | ICD-10-CM

## 2022-01-01 MED ORDER — PANTOPRAZOLE SODIUM 40 MG PO TBEC: 40.0000 mg | DELAYED_RELEASE_TABLET | Freq: Two times a day (BID) | ORAL | 0 refills | Status: DC

## 2022-01-01 NOTE — Telephone Encounter (Signed)
Informed patient that we will send a refill until his scheduled appt. Patient verbalized understanding. Prescription sent to patient's pharmacy.

## 2022-01-01 NOTE — Telephone Encounter (Signed)
Inbound call from patient stating that he is in need for a refill for Protonix. Patient was scheduled for 3/15 at 10:30 with Dr. Fuller Plan but stated that he would like to see if he can get the refill before then due to him only having 5 pills. Please advise.

## 2022-01-11 ENCOUNTER — Ambulatory Visit (INDEPENDENT_AMBULATORY_CARE_PROVIDER_SITE_OTHER): Payer: Medicare Other | Admitting: Internal Medicine

## 2022-01-11 ENCOUNTER — Other Ambulatory Visit: Payer: Self-pay

## 2022-01-11 ENCOUNTER — Encounter: Payer: Self-pay | Admitting: Internal Medicine

## 2022-01-11 DIAGNOSIS — M519 Unspecified thoracic, thoracolumbar and lumbosacral intervertebral disc disorder: Secondary | ICD-10-CM | POA: Diagnosis not present

## 2022-01-11 DIAGNOSIS — E559 Vitamin D deficiency, unspecified: Secondary | ICD-10-CM

## 2022-01-11 DIAGNOSIS — I10 Essential (primary) hypertension: Secondary | ICD-10-CM | POA: Diagnosis not present

## 2022-01-11 DIAGNOSIS — N182 Chronic kidney disease, stage 2 (mild): Secondary | ICD-10-CM | POA: Diagnosis not present

## 2022-01-11 DIAGNOSIS — R7303 Prediabetes: Secondary | ICD-10-CM | POA: Diagnosis not present

## 2022-01-11 DIAGNOSIS — I480 Paroxysmal atrial fibrillation: Secondary | ICD-10-CM | POA: Diagnosis not present

## 2022-01-11 MED ORDER — RYBELSUS 3 MG PO TABS: 3.0000 mg | ORAL_TABLET | Freq: Every day | ORAL | 3 refills | Status: DC

## 2022-01-11 MED ORDER — OXYCODONE HCL 15 MG PO TABS: 15.0000 mg | ORAL_TABLET | Freq: Four times a day (QID) | ORAL | 0 refills | Status: DC

## 2022-01-11 NOTE — Progress Notes (Signed)
Subjective:  Patient ID: Cody Frazier, male    DOB: 1946/05/28  Age: 76 y.o. MRN: [id number]  CC: Follow-up   HPI Cody Frazier presents for  LBP, hearing loss, CAD, pre-DM w/obesity  Outpatient Medications Prior to Visit  Medication Sig Dispense Refill   acetaminophen (TYLENOL) 500 MG tablet Take 1,000 mg by mouth every 6 (six) hours as needed for mild pain or moderate pain. Maximum daily dose of 3g     atorvastatin (LIPITOR) 20 MG tablet TAKE 1 TABLET BY MOUTH EVERY DAY 90 tablet 3   cetirizine (ZYRTEC) 10 MG tablet Take 10 mg by mouth daily.     Cholecalciferol (VITAMIN D3) 1.25 MG (50000 UT) CAPS TAKE 1 CAPSULE BY MOUTH EVERY 30 DAYS. 3 capsule 3   ELIQUIS 2.5 MG TABS tablet TAKE 1 TABLET BY MOUTH TWICE A DAY 180 tablet 3   famotidine (PEPCID) 40 MG tablet Take 1 tablet (40 mg total) by mouth at bedtime. 30 tablet 11   fluticasone (FLONASE) 50 MCG/ACT nasal spray SPRAY 2 SPRAYS INTO EACH NOSTRIL EVERY DAY 48 mL 3   furosemide (LASIX) 40 MG tablet Take 1 tablet (40 mg total) by mouth daily as needed for edema. TAKE 40MG  DAILY x3 DAYS THEN TAKE 40MG  EVERY OTHER DAY THEN BACK TO PRN 45 tablet 12   latanoprost (XALATAN) 0.005 % ophthalmic solution Place 1 drop into both eyes at bedtime.  11   metoprolol succinate (TOPROL-XL) 25 MG 24 hr tablet TAKE 1 TABLET BY MOUTH EVERY DAY 90 tablet 2   pantoprazole (PROTONIX) 40 MG tablet Take 1 tablet (40 mg total) by mouth 2 (two) times daily. 60 tablet 0   polyethylene glycol (MIRALAX / GLYCOLAX) packet Take 17 g by mouth daily. 14 each 0   PRESCRIPTION MEDICATION Inhale into the lungs at bedtime. CPAP     spironolactone (ALDACTONE) 25 MG tablet TAKE 1 TABLET BY MOUTH EVERY DAY 90 tablet 3   telmisartan (MICARDIS) 20 MG tablet TAKE 1 TABLET BY MOUTH EVERY DAY 90 tablet 3   timolol (TIMOPTIC) 0.25 % ophthalmic solution Place 1 drop into both eyes 2 (two) times daily.      triamcinolone cream (KENALOG) 0.1 % Apply 1 application topically 3  (three) times daily. 80 g 3   oxyCODONE (ROXICODONE) 15 MG immediate release tablet Take 1 tablet (15 mg total) by mouth 4 (four) times daily. 120 tablet 0   oxyCODONE (ROXICODONE) 15 MG immediate release tablet Take 1 tablet (15 mg total) by mouth 4 (four) times daily. 120 tablet 0   oxyCODONE (ROXICODONE) 15 MG immediate release tablet Take 1 tablet (15 mg total) by mouth 4 (four) times daily. 120 tablet 0   No facility-administered medications prior to visit.    ROS: Review of Systems  Constitutional:  Positive for fatigue. Negative for appetite change and unexpected weight change.  HENT:  Negative for congestion, nosebleeds, sneezing, sore throat and trouble swallowing.   Eyes:  Negative for itching and visual disturbance.  Respiratory:  Negative for cough.   Cardiovascular:  Negative for chest pain, palpitations and leg swelling.  Gastrointestinal:  Negative for abdominal distention, blood in stool, diarrhea and nausea.  Genitourinary:  Negative for frequency and hematuria.  Musculoskeletal:  Positive for arthralgias, back pain and gait problem. Negative for joint swelling and neck pain.  Skin:  Negative for rash.  Neurological:  Negative for dizziness, tremors, speech difficulty and weakness.  Psychiatric/Behavioral:  Negative for agitation, dysphoric mood, sleep disturbance  and suicidal ideas. The patient is not nervous/anxious.    Objective:  BP 116/62 (BP Location: Left Arm, Patient Position: Sitting, Cuff Size: Large)    Pulse 78    Temp 98.6 F (37 C) (Oral)    Ht 5\' 6"  (1.676 m)    Wt 241 lb (109.3 kg)    SpO2 99%    BMI 38.90 kg/m   BP Readings from Last 3 Encounters:  01/11/22 116/62  12/25/21 118/60  11/20/21 138/82    Wt Readings from Last 3 Encounters:  01/11/22 241 lb (109.3 kg)  12/25/21 241 lb 3.2 oz (109.4 kg)  11/20/21 238 lb 9.6 oz (108.2 kg)    Physical Exam Constitutional:      General: He is not in acute distress.    Appearance: He is  well-developed. He is obese. He is not ill-appearing.     Comments: NAD  Eyes:     Conjunctiva/sclera: Conjunctivae normal.     Pupils: Pupils are equal, round, and reactive to light.  Neck:     Thyroid: No thyromegaly.     Vascular: No JVD.  Cardiovascular:     Rate and Rhythm: Normal rate and regular rhythm.     Heart sounds: Normal heart sounds. No murmur heard.   No friction rub. No gallop.  Pulmonary:     Effort: Pulmonary effort is normal. No respiratory distress.     Breath sounds: Normal breath sounds. No wheezing or rales.  Chest:     Chest wall: No tenderness.  Abdominal:     General: Bowel sounds are normal. There is no distension.     Palpations: Abdomen is soft. There is no mass.     Tenderness: There is no abdominal tenderness. There is no guarding or rebound.  Musculoskeletal:        General: Tenderness present. Normal range of motion.     Cervical back: Normal range of motion.  Lymphadenopathy:     Cervical: No cervical adenopathy.  Skin:    General: Skin is warm and dry.     Findings: No rash.  Neurological:     Mental Status: He is alert and oriented to person, place, and time.     Cranial Nerves: No cranial nerve deficit.     Motor: Weakness present. No abnormal muscle tone.     Coordination: Coordination abnormal.     Gait: Gait abnormal.     Deep Tendon Reflexes: Reflexes are normal and symmetric.  Psychiatric:        Behavior: Behavior normal.        Thought Content: Thought content normal.        Judgment: Judgment normal.   Using a cane LS - painful   Lab Results  Component Value Date   WBC 6.2 11/20/2021   HGB 13.4 11/20/2021   HCT 39.7 11/20/2021   PLT 312 11/20/2021   GLUCOSE 97 11/20/2021   CHOL 196 11/20/2018   TRIG 269.0 (H) 11/20/2018   HDL 43.60 11/20/2018   LDLDIRECT 109.0 11/20/2018   LDLCALC 86 11/25/2017   ALT 12 11/20/2021   AST 16 11/20/2021   NA 141 11/20/2021   K 5.1 11/20/2021   CL 105 11/20/2021   CREATININE 1.12  11/20/2021   BUN 18 11/20/2021   CO2 21 11/20/2021   TSH 0.96 07/13/2021   PSA 0.16 11/20/2018   INR 1.1 08/12/2020   HGBA1C 5.7 07/13/2021    DG Chest 2 View  Result Date: 08/13/2020 CLINICAL DATA:  Iron  deficiency, anemia. EXAM: CHEST - 2 VIEW COMPARISON:  05/19/2018 FINDINGS: Low lung volumes. Mild diffuse interstitial prominence. No confluent airspace disease. Heart size upper limits normal for technique. Aortic Atherosclerosis (ICD10-170.0). Blunting of posterior costophrenic angles suggesting small effusions. No pneumothorax. Spondylitic changes near the thoracolumbar junction. IMPRESSION: Low volumes with small pleural effusions. Electronically Signed   By: Lucrezia Europe M.D.   On: 08/13/2020 14:14   CT ABDOMEN PELVIS W CONTRAST  Result Date: 08/13/2020 CLINICAL DATA:  Anemia.  History of gastric ulcers. EXAM: CT ABDOMEN AND PELVIS WITH CONTRAST TECHNIQUE: Multidetector CT imaging of the abdomen and pelvis was performed using the standard protocol following bolus administration of intravenous contrast. CONTRAST:  174mL OMNIPAQUE IOHEXOL 300 MG/ML  SOLN COMPARISON:  05/10/2017 FINDINGS: Lower chest: Coronary calcifications. New small pleural effusions, right greater than left. Dependent atelectasis in the posterior right lower lobe. Hepatobiliary: No focal liver abnormality is seen. Gallbladder unremarkable. CBD is mildly distended up to 12 mm diameter, seen down to the ampulla without radiodense calculus. Portal vein patent. Pancreas: Diffuse pancreatic atrophy without ductal dilatation. Spleen: Normal in size without focal abnormality. Adrenals/Urinary Tract: Adrenal glands unremarkable. Bilateral renal cysts, stable since previous. No hydronephrosis. Urinary bladder physiologically distended. Stomach/Bowel: Postop changes of [place] 1. Stomach and small bowel are nondilated. Appendix surgically absent. The colon is nondilated, with scattered descending and sigmoid diverticula; no adjacent  inflammatory change. Vascular/Lymphatic: Aortoiliac calcified atheromatous plaque without aneurysm or stenosis. Circumaortic left renal vein, an anatomic variant. Portal vein patent. No abdominal or pelvic adenopathy. Reproductive: Prostate is unremarkable. Other: Bilateral pelvic phleboliths.  No ascites.  No free air. Musculoskeletal: Thoracolumbar dextroscoliosis apex L2 multilevel thoracolumbar spondylitic change. Previous instrumented spinal fusion L1-S1. No fracture or worrisome bone lesion. IMPRESSION: 1. No acute findings. 2. New small bilateral pleural effusions, right greater than left. 3. Coronary and  Aortic Atherosclerosis (ICD10-I70.0). 4. Descending and sigmoid diverticulosis. Electronically Signed   By: Lucrezia Europe M.D.   On: 08/13/2020 14:12    Assessment & Plan:   Problem List Items Addressed This Visit     CRI (chronic renal insufficiency), stage 2 (mild)    Hydrate well      Essential hypertension    Cont on Telmisartan, Lasix      Lumbar disc disease    Continue with oxycodone 15 mg 4 times a day as needed  Potential benefits of a long term opioids use as well as potential risks (i.e. addiction risk, apnea etc) and complications (i.e. Somnolence, constipation and others) were explained to the patient and were aknowledged.      Paroxysmal atrial fibrillation (HCC)    Cont on anticoagulation - Eliquis      Prediabetes    Will try Rybelsus      Vitamin D deficiency    Continue with Vit D 50,000 units monthly         Meds ordered this encounter  Medications   oxyCODONE (ROXICODONE) 15 MG immediate release tablet    Sig: Take 1 tablet (15 mg total) by mouth 4 (four) times daily.    Dispense:  120 tablet    Refill:  0    Fill on or after 03/19/22   oxyCODONE (ROXICODONE) 15 MG immediate release tablet    Sig: Take 1 tablet (15 mg total) by mouth 4 (four) times daily.    Dispense:  120 tablet    Refill:  0    Fill on or after 02/17/22   oxyCODONE (ROXICODONE)  15 MG  immediate release tablet    Sig: Take 1 tablet (15 mg total) by mouth 4 (four) times daily.    Dispense:  120 tablet    Refill:  0    Fill on or after 01/16/22   Semaglutide (RYBELSUS) 3 MG TABS    Sig: Take 3 mg by mouth daily.    Dispense:  30 tablet    Refill:  3      Follow-up: Return in about 3 months (around 04/13/2022) for a follow-up visit.  Walker Kehr, MD

## 2022-01-11 NOTE — Assessment & Plan Note (Signed)
Continue with oxycodone 15 mg 4 times a day as needed ? Potential benefits of a long term opioids use as well as potential risks (i.e. addiction risk, apnea etc) and complications (i.e. Somnolence, constipation and others) were explained to the patient and were aknowledged. ?

## 2022-01-11 NOTE — Assessment & Plan Note (Signed)
Cont on anticoagulation - Eliquis 

## 2022-01-11 NOTE — Assessment & Plan Note (Signed)
Cont on Telmisartan, Lasix 

## 2022-01-11 NOTE — Assessment & Plan Note (Signed)
Continue with Vit D 50,000 units monthly ?

## 2022-01-11 NOTE — Assessment & Plan Note (Signed)
Hydrate well 

## 2022-01-11 NOTE — Assessment & Plan Note (Signed)
Will try Rybelsus  

## 2022-01-15 ENCOUNTER — Ambulatory Visit: Payer: Medicare Other | Admitting: Internal Medicine

## 2022-01-16 DIAGNOSIS — H401131 Primary open-angle glaucoma, bilateral, mild stage: Secondary | ICD-10-CM | POA: Diagnosis not present

## 2022-01-19 ENCOUNTER — Telehealth: Payer: Self-pay

## 2022-01-19 DIAGNOSIS — Z20822 Contact with and (suspected) exposure to covid-19: Secondary | ICD-10-CM | POA: Diagnosis not present

## 2022-01-19 NOTE — Telephone Encounter (Signed)
PA for Rybelsus has been started. ? ?Key: [id number] ?

## 2022-01-24 ENCOUNTER — Encounter: Payer: Self-pay | Admitting: Gastroenterology

## 2022-01-24 ENCOUNTER — Ambulatory Visit (INDEPENDENT_AMBULATORY_CARE_PROVIDER_SITE_OTHER): Payer: Medicare Other | Admitting: Gastroenterology

## 2022-01-24 VITALS — BP 134/84 | HR 60 | Ht 66.0 in | Wt 241.0 lb

## 2022-01-24 DIAGNOSIS — Z86010 Personal history of colonic polyps: Secondary | ICD-10-CM

## 2022-01-24 DIAGNOSIS — Z7901 Long term (current) use of anticoagulants: Secondary | ICD-10-CM | POA: Diagnosis not present

## 2022-01-24 DIAGNOSIS — K2270 Barrett's esophagus without dysplasia: Secondary | ICD-10-CM | POA: Diagnosis not present

## 2022-01-24 DIAGNOSIS — D508 Other iron deficiency anemias: Secondary | ICD-10-CM | POA: Diagnosis not present

## 2022-01-24 MED ORDER — FAMOTIDINE 40 MG PO TABS: 40.0000 mg | ORAL_TABLET | Freq: Every day | ORAL | 11 refills | Status: DC

## 2022-01-24 MED ORDER — PANTOPRAZOLE SODIUM 40 MG PO TBEC: 40.0000 mg | DELAYED_RELEASE_TABLET | Freq: Two times a day (BID) | ORAL | 11 refills | Status: DC

## 2022-01-24 NOTE — Progress Notes (Signed)
? ? ?  History of Present Illness: This is a 76 year old male with Barrett's esophagus.  He relates his reflux symptoms are well controlled.  He has occasional mild nausea that responds to Pepto-Bismol.  His constipation is controlled with MiraLAX. ? ?Current Medications, Allergies, Past Medical History, Past Surgical History, Family History and Social History were reviewed in [employer] record. ? ? ?Physical Exam: ?General: Well developed, well nourished, no acute distress ?Head: Normocephalic and atraumatic ?Eyes: Sclerae anicteric, EOMI ?Ears: Normal auditory acuity ?Mouth: Not examined, mask on during Covid-19 pandemic ?Lungs: Clear throughout to auscultation ?Heart: Regular rate and rhythm; no murmurs, rubs or bruits ?Abdomen: Soft, non tender and non distended. No masses, hepatosplenomegaly or hernias noted. Normal Bowel sounds ?Rectal: Not done ?Musculoskeletal: Symmetrical with no gross deformities  ?Pulses:  Normal pulses noted ?Extremities: No clubbing, cyanosis, edema or deformities noted ?Neurological: Alert oriented x 4, grossly nonfocal ?Psychological:  Alert and cooperative. Normal mood and affect ? ? ?Assessment and Recommendations: ? ?Long segment Barrett's esophagus without dysplasia.  Continue pantoprazole 40 mg p.o. twice daily and famotidine 40 mg at bedtime.  Follow antireflux measures.  Surveillance EGD is recommended in Sept 2024. ?IDA, resolved.  ?PAF on Eliquis         ?[facility], personal history of SSA. Consider surveillance colonoscopy in Oct 2026 vs discontinuing surveillance colonoscopies due to age and comorbidities. ?OSA ?

## 2022-01-24 NOTE — Patient Instructions (Signed)
We have sent the following medications to your pharmacy for you to pick up at your convenience: pantoprazole and famotidine.   The [facility] providers would like to encourage you to use MYCHART to communicate with providers for non-urgent requests or questions.  Due to long hold times on the telephone, sending your provider a message by MYCHART may be a faster and more efficient way to get a response.  Please allow 48 business hours for a response.  Please remember that this is for non-urgent requests.   Thank you for choosing me and [facility].  Malcolm T. Stark, Jr., MD., FACG   

## 2022-01-31 ENCOUNTER — Other Ambulatory Visit: Payer: Self-pay

## 2022-01-31 ENCOUNTER — Ambulatory Visit (INDEPENDENT_AMBULATORY_CARE_PROVIDER_SITE_OTHER): Payer: Medicare Other | Admitting: Cardiovascular Disease

## 2022-01-31 ENCOUNTER — Encounter: Payer: Self-pay | Admitting: Cardiovascular Disease

## 2022-01-31 VITALS — BP 122/72 | HR 78 | Ht 66.0 in | Wt 244.6 lb

## 2022-01-31 DIAGNOSIS — I700 Atherosclerosis of aorta: Secondary | ICD-10-CM

## 2022-01-31 DIAGNOSIS — D500 Iron deficiency anemia secondary to blood loss (chronic): Secondary | ICD-10-CM

## 2022-01-31 DIAGNOSIS — Z9989 Dependence on other enabling machines and devices: Secondary | ICD-10-CM

## 2022-01-31 DIAGNOSIS — I350 Nonrheumatic aortic (valve) stenosis: Secondary | ICD-10-CM

## 2022-01-31 DIAGNOSIS — D6869 Other thrombophilia: Secondary | ICD-10-CM

## 2022-01-31 DIAGNOSIS — G4733 Obstructive sleep apnea (adult) (pediatric): Secondary | ICD-10-CM

## 2022-01-31 DIAGNOSIS — I5032 Chronic diastolic (congestive) heart failure: Secondary | ICD-10-CM | POA: Diagnosis not present

## 2022-01-31 DIAGNOSIS — I2721 Secondary pulmonary arterial hypertension: Secondary | ICD-10-CM | POA: Diagnosis not present

## 2022-01-31 DIAGNOSIS — I480 Paroxysmal atrial fibrillation: Secondary | ICD-10-CM | POA: Diagnosis not present

## 2022-01-31 DIAGNOSIS — I10 Essential (primary) hypertension: Secondary | ICD-10-CM

## 2022-01-31 DIAGNOSIS — Z20822 Contact with and (suspected) exposure to covid-19: Secondary | ICD-10-CM | POA: Diagnosis not present

## 2022-01-31 NOTE — Patient Instructions (Signed)
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
## 2022-02-19 DIAGNOSIS — Z20822 Contact with and (suspected) exposure to covid-19: Secondary | ICD-10-CM | POA: Diagnosis not present

## 2022-02-23 DIAGNOSIS — Z20822 Contact with and (suspected) exposure to covid-19: Secondary | ICD-10-CM | POA: Diagnosis not present

## 2022-03-12 ENCOUNTER — Other Ambulatory Visit: Payer: Self-pay | Admitting: General Practice

## 2022-03-12 DIAGNOSIS — Z20822 Contact with and (suspected) exposure to covid-19: Secondary | ICD-10-CM | POA: Diagnosis not present

## 2022-03-14 DIAGNOSIS — Z20822 Contact with and (suspected) exposure to covid-19: Secondary | ICD-10-CM | POA: Diagnosis not present

## 2022-04-16 ENCOUNTER — Encounter: Payer: Self-pay | Admitting: Internal Medicine

## 2022-04-16 ENCOUNTER — Ambulatory Visit (INDEPENDENT_AMBULATORY_CARE_PROVIDER_SITE_OTHER): Payer: Medicare Other | Admitting: Internal Medicine

## 2022-04-16 DIAGNOSIS — M25531 Pain in right wrist: Secondary | ICD-10-CM

## 2022-04-16 DIAGNOSIS — I10 Essential (primary) hypertension: Secondary | ICD-10-CM | POA: Diagnosis not present

## 2022-04-16 DIAGNOSIS — E559 Vitamin D deficiency, unspecified: Secondary | ICD-10-CM | POA: Diagnosis not present

## 2022-04-16 DIAGNOSIS — Z6835 Body mass index (BMI) 35.0-35.9, adult: Secondary | ICD-10-CM

## 2022-04-16 DIAGNOSIS — G8929 Other chronic pain: Secondary | ICD-10-CM

## 2022-04-16 DIAGNOSIS — R7303 Prediabetes: Secondary | ICD-10-CM

## 2022-04-16 DIAGNOSIS — E785 Hyperlipidemia, unspecified: Secondary | ICD-10-CM | POA: Diagnosis not present

## 2022-04-16 MED ORDER — OXYCODONE HCL 15 MG PO TABS: 15.0000 mg | ORAL_TABLET | Freq: Four times a day (QID) | ORAL | 0 refills | Status: DC

## 2022-04-16 NOTE — Assessment & Plan Note (Signed)
On Vit D 

## 2022-04-16 NOTE — Patient Instructions (Signed)
Blue-Emu cream --use 2-3 times a day Use heat

## 2022-04-16 NOTE — Assessment & Plan Note (Signed)
-   Continue with Lipitor 20mg daily

## 2022-04-16 NOTE — Progress Notes (Signed)
Subjective:  Patient ID: Cody Frazier, male    DOB: 03/22/1946  Age: 76 y.o. MRN: [id number]  CC: No chief complaint on file.   HPI Cody Frazier presents for LBP, HTN, DM Pt lost 5 lbs on diet Rybelsus was too $$$  Outpatient Medications Prior to Visit  Medication Sig Dispense Refill   acetaminophen (TYLENOL) 500 MG tablet Take 1,000 mg by mouth every 6 (six) hours as needed for mild pain or moderate pain. Maximum daily dose of 3g     atorvastatin (LIPITOR) 20 MG tablet TAKE 1 TABLET BY MOUTH EVERY DAY 90 tablet 3   cetirizine (ZYRTEC) 10 MG tablet Take 10 mg by mouth daily.     Cholecalciferol (VITAMIN D3) 1.25 MG (50000 UT) CAPS TAKE 1 CAPSULE BY MOUTH EVERY 30 DAYS. 3 capsule 3   ELIQUIS 2.5 MG TABS tablet TAKE 1 TABLET BY MOUTH TWICE A DAY 180 tablet 3   famotidine (PEPCID) 40 MG tablet Take 1 tablet (40 mg total) by mouth at bedtime. 30 tablet 11   fluticasone (FLONASE) 50 MCG/ACT nasal spray SPRAY 2 SPRAYS INTO EACH NOSTRIL EVERY DAY 48 mL 3   furosemide (LASIX) 40 MG tablet Take 1 tablet (40 mg total) by mouth daily as needed for edema. TAKE '40MG'$  DAILY x3 DAYS THEN TAKE '40MG'$  EVERY OTHER DAY THEN BACK TO PRN 45 tablet 12   latanoprost (XALATAN) 0.005 % ophthalmic solution Place 1 drop into both eyes at bedtime.  11   metoprolol succinate (TOPROL-XL) 25 MG 24 hr tablet TAKE 1 TABLET BY MOUTH EVERY DAY 90 tablet 3   pantoprazole (PROTONIX) 40 MG tablet Take 1 tablet (40 mg total) by mouth 2 (two) times daily. 60 tablet 11   PRESCRIPTION MEDICATION Inhale into the lungs at bedtime. CPAP     spironolactone (ALDACTONE) 25 MG tablet TAKE 1 TABLET BY MOUTH EVERY DAY 90 tablet 3   telmisartan (MICARDIS) 20 MG tablet TAKE 1 TABLET BY MOUTH EVERY DAY 90 tablet 3   timolol (TIMOPTIC) 0.25 % ophthalmic solution Place 1 drop into both eyes 2 (two) times daily.      triamcinolone cream (KENALOG) 0.1 % Apply 1 application topically 3 (three) times daily. 80 g 3   oxyCODONE (ROXICODONE)  15 MG immediate release tablet Take 1 tablet (15 mg total) by mouth 4 (four) times daily. 120 tablet 0   polyethylene glycol (MIRALAX / GLYCOLAX) packet Take 17 g by mouth daily. (Patient not taking: Reported on 01/31/2022) 14 each 0   No facility-administered medications prior to visit.    ROS: Review of Systems  Constitutional:  Negative for appetite change, fatigue and unexpected weight change.  HENT:  Negative for congestion, nosebleeds, sneezing, sore throat and trouble swallowing.   Eyes:  Negative for itching and visual disturbance.  Respiratory:  Negative for cough.   Cardiovascular:  Negative for chest pain, palpitations and leg swelling.  Gastrointestinal:  Negative for abdominal distention, blood in stool, diarrhea and nausea.  Genitourinary:  Negative for frequency and hematuria.  Musculoskeletal:  Positive for back pain and gait problem. Negative for joint swelling and neck pain.  Skin:  Negative for rash.  Neurological:  Negative for dizziness, tremors, speech difficulty and weakness.  Psychiatric/Behavioral:  Negative for agitation, dysphoric mood, sleep disturbance and suicidal ideas. The patient is not nervous/anxious.    Objective:  BP 130/80 (BP Location: Right Arm, Patient Position: Sitting, Cuff Size: Normal)   Pulse 64   Temp 99 F (37.2  C) (Oral)   Ht '5\' 6"'$  (1.676 m)   Wt 239 lb (108.4 kg)   SpO2 99%   BMI 38.58 kg/m   BP Readings from Last 3 Encounters:  04/16/22 130/80  01/31/22 122/72  01/24/22 134/84    Wt Readings from Last 3 Encounters:  04/16/22 239 lb (108.4 kg)  01/31/22 244 lb 9.6 oz (110.9 kg)  01/24/22 241 lb (109.3 kg)    Physical Exam Constitutional:      General: He is not in acute distress.    Appearance: He is well-developed. He is obese.     Comments: NAD  Eyes:     Conjunctiva/sclera: Conjunctivae normal.     Pupils: Pupils are equal, round, and reactive to light.  Neck:     Thyroid: No thyromegaly.     Vascular: No JVD.   Cardiovascular:     Rate and Rhythm: Normal rate and regular rhythm.     Heart sounds: Normal heart sounds. No murmur heard.   No friction rub. No gallop.  Pulmonary:     Effort: Pulmonary effort is normal. No respiratory distress.     Breath sounds: Normal breath sounds. No wheezing or rales.  Chest:     Chest wall: No tenderness.  Abdominal:     General: Bowel sounds are normal. There is no distension.     Palpations: Abdomen is soft. There is no mass.     Tenderness: There is no abdominal tenderness. There is no guarding or rebound.  Musculoskeletal:        General: Tenderness present. Normal range of motion.     Cervical back: Normal range of motion.  Lymphadenopathy:     Cervical: No cervical adenopathy.  Skin:    General: Skin is warm and dry.     Findings: No rash.  Neurological:     Mental Status: He is alert and oriented to person, place, and time.     Cranial Nerves: No cranial nerve deficit.     Motor: No abnormal muscle tone.     Coordination: Coordination abnormal.     Gait: Gait abnormal.     Deep Tendon Reflexes: Reflexes are normal and symmetric.  Psychiatric:        Behavior: Behavior normal.        Thought Content: Thought content normal.        Judgment: Judgment normal.  LS w/pain Using a cane R dorsal wrist w/pain  Lab Results  Component Value Date   WBC 6.2 11/20/2021   HGB 13.4 11/20/2021   HCT 39.7 11/20/2021   PLT 312 11/20/2021   GLUCOSE 97 11/20/2021   CHOL 196 11/20/2018   TRIG 269.0 (H) 11/20/2018   HDL 43.60 11/20/2018   LDLDIRECT 109.0 11/20/2018   LDLCALC 86 11/25/2017   ALT 12 11/20/2021   AST 16 11/20/2021   NA 141 11/20/2021   K 5.1 11/20/2021   CL 105 11/20/2021   CREATININE 1.12 11/20/2021   BUN 18 11/20/2021   CO2 21 11/20/2021   TSH 0.96 07/13/2021   PSA 0.16 11/20/2018   INR 1.1 08/12/2020   HGBA1C 5.7 07/13/2021    DG Chest 2 View  Result Date: 08/13/2020 CLINICAL DATA:  Iron deficiency, anemia. EXAM: CHEST - 2  VIEW COMPARISON:  05/19/2018 FINDINGS: Low lung volumes. Mild diffuse interstitial prominence. No confluent airspace disease. Heart size upper limits normal for technique. Aortic Atherosclerosis (ICD10-170.0). Blunting of posterior costophrenic angles suggesting small effusions. No pneumothorax. Spondylitic changes near the thoracolumbar junction.  IMPRESSION: Low volumes with small pleural effusions. Electronically Signed   By: Lucrezia Europe M.D.   On: 08/13/2020 14:14   CT ABDOMEN PELVIS W CONTRAST  Result Date: 08/13/2020 CLINICAL DATA:  Anemia.  History of gastric ulcers. EXAM: CT ABDOMEN AND PELVIS WITH CONTRAST TECHNIQUE: Multidetector CT imaging of the abdomen and pelvis was performed using the standard protocol following bolus administration of intravenous contrast. CONTRAST:  144m OMNIPAQUE IOHEXOL 300 MG/ML  SOLN COMPARISON:  05/10/2017 FINDINGS: Lower chest: Coronary calcifications. New small pleural effusions, right greater than left. Dependent atelectasis in the posterior right lower lobe. Hepatobiliary: No focal liver abnormality is seen. Gallbladder unremarkable. CBD is mildly distended up to 12 mm diameter, seen down to the ampulla without radiodense calculus. Portal vein patent. Pancreas: Diffuse pancreatic atrophy without ductal dilatation. Spleen: Normal in size without focal abnormality. Adrenals/Urinary Tract: Adrenal glands unremarkable. Bilateral renal cysts, stable since previous. No hydronephrosis. Urinary bladder physiologically distended. Stomach/Bowel: Postop changes of Billroth 1. Stomach and small bowel are nondilated. Appendix surgically absent. The colon is nondilated, with scattered descending and sigmoid diverticula; no adjacent inflammatory change. Vascular/Lymphatic: Aortoiliac calcified atheromatous plaque without aneurysm or stenosis. Circumaortic left renal vein, an anatomic variant. Portal vein patent. No abdominal or pelvic adenopathy. Reproductive: Prostate is unremarkable.  Other: Bilateral pelvic phleboliths.  No ascites.  No free air. Musculoskeletal: Thoracolumbar dextroscoliosis apex L2 multilevel thoracolumbar spondylitic change. Previous instrumented spinal fusion L1-S1. No fracture or worrisome bone lesion. IMPRESSION: 1. No acute findings. 2. New small bilateral pleural effusions, right greater than left. 3. Coronary and  Aortic Atherosclerosis (ICD10-I70.0). 4. Descending and sigmoid diverticulosis. Electronically Signed   By: DLucrezia EuropeM.D.   On: 08/13/2020 14:12    Assessment & Plan:   Problem List Items Addressed This Visit     Dyslipidemia    Continue with Lipitor 20 mg daily      Essential hypertension    Cont on Telmisartan, Lasix      Obesity    Cont w/wt loss on diet       Prediabetes    Rybelsus was too $$$, not taking      Vitamin D deficiency    On Vit D       Wrist pain, chronic, right    Blue-Emu cream --use 2-3 times a day Use heat We can inject steroids      Relevant Medications   oxyCODONE (ROXICODONE) 15 MG immediate release tablet   oxyCODONE (ROXICODONE) 15 MG immediate release tablet   oxyCODONE (ROXICODONE) 15 MG immediate release tablet      Meds ordered this encounter  Medications   oxyCODONE (ROXICODONE) 15 MG immediate release tablet    Sig: Take 1 tablet (15 mg total) by mouth 4 (four) times daily.    Dispense:  120 tablet    Refill:  0    Fill on or after 04/18/22   oxyCODONE (ROXICODONE) 15 MG immediate release tablet    Sig: Take 1 tablet (15 mg total) by mouth 4 (four) times daily.    Dispense:  120 tablet    Refill:  0    Fill on or after 05/18/22   oxyCODONE (ROXICODONE) 15 MG immediate release tablet    Sig: Take 1 tablet (15 mg total) by mouth 4 (four) times daily.    Dispense:  120 tablet    Refill:  0    Fill on or after 06/17/22      Follow-up: Return in about 3 months (around 07/17/2022)  for a follow-up visit.  Walker Kehr, MD

## 2022-04-16 NOTE — Assessment & Plan Note (Signed)
Cont w/wt loss on diet

## 2022-04-16 NOTE — Assessment & Plan Note (Addendum)
Blue-Emu cream --use 2-3 times a day Use heat We can inject steroids

## 2022-04-16 NOTE — Assessment & Plan Note (Signed)
Cont on Telmisartan, Lasix 

## 2022-04-16 NOTE — Assessment & Plan Note (Signed)
Rybelsus was too $$$, not taking

## 2022-04-20 ENCOUNTER — Encounter: Payer: Self-pay | Admitting: Gastroenterology

## 2022-05-12 ENCOUNTER — Observation Stay (HOSPITAL_COMMUNITY)
Admission: EM | Admit: 2022-05-12 | Discharge: 2022-05-13 | Disposition: A | Discharge status: Home / Self Care | Payer: Medicare Other | Attending: Internal Medicine | Admitting: Internal Medicine

## 2022-05-12 ENCOUNTER — Encounter (HOSPITAL_COMMUNITY): Payer: Self-pay | Admitting: Emergency Medicine

## 2022-05-12 ENCOUNTER — Other Ambulatory Visit: Payer: Self-pay

## 2022-05-12 DIAGNOSIS — Z79899 Other long term (current) drug therapy: Secondary | ICD-10-CM | POA: Diagnosis not present

## 2022-05-12 DIAGNOSIS — I480 Paroxysmal atrial fibrillation: Secondary | ICD-10-CM | POA: Diagnosis not present

## 2022-05-12 DIAGNOSIS — I2510 Atherosclerotic heart disease of native coronary artery without angina pectoris: Secondary | ICD-10-CM | POA: Diagnosis not present

## 2022-05-12 DIAGNOSIS — R0789 Other chest pain: Secondary | ICD-10-CM | POA: Diagnosis not present

## 2022-05-12 DIAGNOSIS — I5032 Chronic diastolic (congestive) heart failure: Secondary | ICD-10-CM | POA: Diagnosis not present

## 2022-05-12 DIAGNOSIS — Z85038 Personal history of other malignant neoplasm of large intestine: Secondary | ICD-10-CM | POA: Insufficient documentation

## 2022-05-12 DIAGNOSIS — Z7901 Long term (current) use of anticoagulants: Secondary | ICD-10-CM | POA: Insufficient documentation

## 2022-05-12 DIAGNOSIS — K838 Other specified diseases of biliary tract: Secondary | ICD-10-CM | POA: Diagnosis not present

## 2022-05-12 DIAGNOSIS — K219 Gastro-esophageal reflux disease without esophagitis: Secondary | ICD-10-CM | POA: Diagnosis present

## 2022-05-12 DIAGNOSIS — K5730 Diverticulosis of large intestine without perforation or abscess without bleeding: Secondary | ICD-10-CM | POA: Diagnosis not present

## 2022-05-12 DIAGNOSIS — K859 Acute pancreatitis without necrosis or infection, unspecified: Secondary | ICD-10-CM | POA: Diagnosis not present

## 2022-05-12 DIAGNOSIS — I200 Unstable angina: Secondary | ICD-10-CM | POA: Diagnosis not present

## 2022-05-12 DIAGNOSIS — M519 Unspecified thoracic, thoracolumbar and lumbosacral intervertebral disc disorder: Secondary | ICD-10-CM

## 2022-05-12 DIAGNOSIS — Z87891 Personal history of nicotine dependence: Secondary | ICD-10-CM | POA: Insufficient documentation

## 2022-05-12 DIAGNOSIS — J9811 Atelectasis: Secondary | ICD-10-CM | POA: Diagnosis not present

## 2022-05-12 DIAGNOSIS — I10 Essential (primary) hypertension: Secondary | ICD-10-CM | POA: Diagnosis not present

## 2022-05-12 DIAGNOSIS — I110 Hypertensive heart disease with heart failure: Secondary | ICD-10-CM | POA: Insufficient documentation

## 2022-05-12 DIAGNOSIS — E785 Hyperlipidemia, unspecified: Secondary | ICD-10-CM | POA: Diagnosis present

## 2022-05-12 DIAGNOSIS — K829 Disease of gallbladder, unspecified: Secondary | ICD-10-CM | POA: Insufficient documentation

## 2022-05-12 DIAGNOSIS — N281 Cyst of kidney, acquired: Secondary | ICD-10-CM | POA: Diagnosis not present

## 2022-05-12 DIAGNOSIS — R079 Chest pain, unspecified: Secondary | ICD-10-CM | POA: Diagnosis not present

## 2022-05-12 DIAGNOSIS — K828 Other specified diseases of gallbladder: Secondary | ICD-10-CM | POA: Diagnosis not present

## 2022-05-12 DIAGNOSIS — R110 Nausea: Secondary | ICD-10-CM | POA: Diagnosis not present

## 2022-05-12 NOTE — ED Triage Notes (Signed)
Patient arrived via EMS from home with complaints of chest pain, associated with nausea and shortness of breath, onset around 8am today. Patient given en-route '324mg'$  aspirin, sublingual nitro x2 (improved pain from 8-6/10). Vitals per EMS: BP-190/100, HR-86, RR-16, Spo2-94% on room air, CBG-141.

## 2022-05-13 ENCOUNTER — Emergency Department (HOSPITAL_COMMUNITY): Payer: Medicare Other

## 2022-05-13 ENCOUNTER — Observation Stay (HOSPITAL_BASED_OUTPATIENT_CLINIC_OR_DEPARTMENT_OTHER): Payer: Medicare Other

## 2022-05-13 ENCOUNTER — Encounter (HOSPITAL_COMMUNITY): Payer: Self-pay | Admitting: Internal Medicine

## 2022-05-13 ENCOUNTER — Observation Stay (HOSPITAL_COMMUNITY): Payer: Medicare Other

## 2022-05-13 DIAGNOSIS — R0789 Other chest pain: Secondary | ICD-10-CM | POA: Diagnosis present

## 2022-05-13 DIAGNOSIS — I2510 Atherosclerotic heart disease of native coronary artery without angina pectoris: Secondary | ICD-10-CM | POA: Diagnosis not present

## 2022-05-13 DIAGNOSIS — K219 Gastro-esophageal reflux disease without esophagitis: Secondary | ICD-10-CM | POA: Diagnosis not present

## 2022-05-13 DIAGNOSIS — J9811 Atelectasis: Secondary | ICD-10-CM | POA: Diagnosis not present

## 2022-05-13 DIAGNOSIS — I10 Essential (primary) hypertension: Secondary | ICD-10-CM | POA: Diagnosis not present

## 2022-05-13 DIAGNOSIS — R079 Chest pain, unspecified: Secondary | ICD-10-CM

## 2022-05-13 DIAGNOSIS — E785 Hyperlipidemia, unspecified: Secondary | ICD-10-CM

## 2022-05-13 DIAGNOSIS — I5032 Chronic diastolic (congestive) heart failure: Secondary | ICD-10-CM | POA: Diagnosis present

## 2022-05-13 DIAGNOSIS — I480 Paroxysmal atrial fibrillation: Secondary | ICD-10-CM

## 2022-05-13 DIAGNOSIS — K829 Disease of gallbladder, unspecified: Secondary | ICD-10-CM | POA: Diagnosis not present

## 2022-05-13 DIAGNOSIS — K5730 Diverticulosis of large intestine without perforation or abscess without bleeding: Secondary | ICD-10-CM | POA: Diagnosis not present

## 2022-05-13 DIAGNOSIS — K859 Acute pancreatitis without necrosis or infection, unspecified: Secondary | ICD-10-CM | POA: Diagnosis not present

## 2022-05-13 DIAGNOSIS — K828 Other specified diseases of gallbladder: Secondary | ICD-10-CM | POA: Diagnosis not present

## 2022-05-13 DIAGNOSIS — R110 Nausea: Secondary | ICD-10-CM | POA: Diagnosis not present

## 2022-05-13 DIAGNOSIS — K838 Other specified diseases of biliary tract: Secondary | ICD-10-CM | POA: Diagnosis not present

## 2022-05-13 DIAGNOSIS — N281 Cyst of kidney, acquired: Secondary | ICD-10-CM | POA: Diagnosis not present

## 2022-05-13 LAB — CBC WITH DIFFERENTIAL/PLATELET
Abs Immature Granulocytes: 0.03 10*3/uL (ref 0.00–0.07)
Abs Immature Granulocytes: 0.03 10*3/uL (ref 0.00–0.07)
Basophils Absolute: 0 10*3/uL (ref 0.0–0.1)
Basophils Absolute: 0 10*3/uL (ref 0.0–0.1)
Basophils Relative: 0 %
Basophils Relative: 0 %
Eosinophils Absolute: 0 10*3/uL (ref 0.0–0.5)
Eosinophils Absolute: 0 10*3/uL (ref 0.0–0.5)
Eosinophils Relative: 0 %
Eosinophils Relative: 0 %
HCT: 40.4 % (ref 39.0–52.0)
HCT: 42.1 % (ref 39.0–52.0)
Hemoglobin: 13.8 g/dL (ref 13.0–17.0)
Hemoglobin: 14.1 g/dL (ref 13.0–17.0)
Immature Granulocytes: 0 %
Immature Granulocytes: 0 %
Lymphocytes Relative: 15 %
Lymphocytes Relative: 21 %
Lymphs Abs: 1.3 10*3/uL (ref 0.7–4.0)
Lymphs Abs: 1.9 10*3/uL (ref 0.7–4.0)
MCH: 32 pg (ref 26.0–34.0)
MCH: 32.3 pg (ref 26.0–34.0)
MCHC: 33.5 g/dL (ref 30.0–36.0)
MCHC: 34.2 g/dL (ref 30.0–36.0)
MCV: 94.6 fL (ref 80.0–100.0)
MCV: 95.5 fL (ref 80.0–100.0)
Monocytes Absolute: 0.6 10*3/uL (ref 0.1–1.0)
Monocytes Absolute: 0.7 10*3/uL (ref 0.1–1.0)
Monocytes Relative: 6 %
Monocytes Relative: 7 %
Neutro Abs: 6.5 10*3/uL (ref 1.7–7.7)
Neutro Abs: 6.7 10*3/uL (ref 1.7–7.7)
Neutrophils Relative %: 72 %
Neutrophils Relative %: 79 %
Platelets: 291 10*3/uL (ref 150–400)
Platelets: 304 10*3/uL (ref 150–400)
RBC: 4.27 MIL/uL (ref 4.22–5.81)
RBC: 4.41 MIL/uL (ref 4.22–5.81)
RDW: 12.1 % (ref 11.5–15.5)
RDW: 12.1 % (ref 11.5–15.5)
WBC: 8.7 10*3/uL (ref 4.0–10.5)
WBC: 9.1 10*3/uL (ref 4.0–10.5)
nRBC: 0 % (ref 0.0–0.2)
nRBC: 0 % (ref 0.0–0.2)

## 2022-05-13 LAB — ECHOCARDIOGRAM COMPLETE
Area-P 1/2: 2.9 cm2
Height: 66 in
S' Lateral: 2.2 cm
Weight: 3840 oz

## 2022-05-13 LAB — COMPREHENSIVE METABOLIC PANEL
ALT: 15 U/L (ref 0–44)
ALT: 15 U/L (ref 0–44)
AST: 17 U/L (ref 15–41)
AST: 18 U/L (ref 15–41)
Albumin: 3.5 g/dL (ref 3.5–5.0)
Albumin: 3.7 g/dL (ref 3.5–5.0)
Alkaline Phosphatase: 76 U/L (ref 38–126)
Alkaline Phosphatase: 86 U/L (ref 38–126)
Anion gap: 10 (ref 5–15)
Anion gap: 13 (ref 5–15)
BUN: 11 mg/dL (ref 8–23)
BUN: 11 mg/dL (ref 8–23)
CO2: 23 mmol/L (ref 22–32)
CO2: 24 mmol/L (ref 22–32)
Calcium: 10 mg/dL (ref 8.9–10.3)
Calcium: 9.8 mg/dL (ref 8.9–10.3)
Chloride: 105 mmol/L (ref 98–111)
Chloride: 108 mmol/L (ref 98–111)
Creatinine, Ser: 1.07 mg/dL (ref 0.61–1.24)
Creatinine, Ser: 1.17 mg/dL (ref 0.61–1.24)
GFR, Estimated: >60 mL/min (ref >60)
GFR, Estimated: >60 mL/min (ref >60)
Glucose, Bld: 120 mg/dL — ABNORMAL HIGH (ref 70–99)
Glucose, Bld: 130 mg/dL — ABNORMAL HIGH (ref 70–99)
Potassium: 4.1 mmol/L (ref 3.5–5.1)
Potassium: 4.2 mmol/L (ref 3.5–5.1)
Sodium: 141 mmol/L (ref 135–145)
Sodium: 142 mmol/L (ref 135–145)
Total Bilirubin: 0.8 mg/dL (ref 0.3–1.2)
Total Bilirubin: 0.9 mg/dL (ref 0.3–1.2)
Total Protein: 6.4 g/dL — ABNORMAL LOW (ref 6.5–8.1)
Total Protein: 6.8 g/dL (ref 6.5–8.1)

## 2022-05-13 LAB — URINALYSIS, ROUTINE W REFLEX MICROSCOPIC
Bacteria, UA: NONE SEEN
Bilirubin Urine: NEGATIVE
Glucose, UA: NEGATIVE mg/dL
Ketones, ur: NEGATIVE mg/dL
Leukocytes,Ua: NEGATIVE
Nitrite: NEGATIVE
Protein, ur: NEGATIVE mg/dL
Specific Gravity, Urine: 1.011 (ref 1.005–1.030)
pH: 6 (ref 5.0–8.0)

## 2022-05-13 LAB — TROPONIN I (HIGH SENSITIVITY)
Troponin I (High Sensitivity): 16 ng/L (ref <18)
Troponin I (High Sensitivity): 16 ng/L (ref <18)
Troponin I (High Sensitivity): 17 ng/L (ref <18)

## 2022-05-13 LAB — LIPASE, BLOOD: Lipase: 23 U/L (ref 11–51)

## 2022-05-13 LAB — MAGNESIUM: Magnesium: 2 mg/dL (ref 1.7–2.4)

## 2022-05-13 MED ORDER — HYDROMORPHONE HCL 1 MG/ML IJ SOLN
0.5000 mg | INTRAMUSCULAR | Status: DC | PRN
  Administered 2022-05-13 (×3): 0.5 mg via INTRAVENOUS
  Filled 2022-05-13 (×3): qty 1

## 2022-05-13 MED ORDER — ACETAMINOPHEN 650 MG RE SUPP: 650.0000 mg | Freq: Four times a day (QID) | RECTAL | Status: DC | PRN

## 2022-05-13 MED ORDER — OXYCODONE HCL 5 MG PO TABS
5.0000 mg | ORAL_TABLET | Freq: Four times a day (QID) | ORAL | 0 refills | Status: DC | PRN
Start: 2022-05-13 — End: 2022-05-13

## 2022-05-13 MED ORDER — ACETAMINOPHEN 325 MG PO TABS: 650.0000 mg | ORAL_TABLET | Freq: Four times a day (QID) | ORAL | Status: DC | PRN

## 2022-05-13 MED ORDER — METOPROLOL SUCCINATE ER 25 MG PO TB24
25.0000 mg | ORAL_TABLET | Freq: Every day | ORAL | Status: DC
  Administered 2022-05-13: 25 mg via ORAL
  Filled 2022-05-13: qty 1

## 2022-05-13 MED ORDER — FAMOTIDINE 20 MG PO TABS: 40.0000 mg | ORAL_TABLET | Freq: Every day | ORAL | Status: DC

## 2022-05-13 MED ORDER — FENTANYL CITRATE PF 50 MCG/ML IJ SOSY
50.0000 ug | PREFILLED_SYRINGE | Freq: Once | INTRAMUSCULAR | Status: AC
  Administered 2022-05-13: 50 ug via INTRAVENOUS
  Filled 2022-05-13: qty 1

## 2022-05-13 MED ORDER — OXYCODONE HCL 5 MG PO TABS: 5.0000 mg | ORAL_TABLET | Freq: Four times a day (QID) | ORAL | Status: DC | PRN

## 2022-05-13 MED ORDER — IRBESARTAN 75 MG PO TABS
75.0000 mg | ORAL_TABLET | Freq: Every day | ORAL | Status: DC
  Administered 2022-05-13: 75 mg via ORAL
  Filled 2022-05-13: qty 1

## 2022-05-13 MED ORDER — TIMOLOL MALEATE 0.25 % OP SOLN
1.0000 [drp] | Freq: Two times a day (BID) | OPHTHALMIC | Status: DC
  Administered 2022-05-13: 1 [drp] via OPHTHALMIC
  Filled 2022-05-13: qty 5

## 2022-05-13 MED ORDER — NITROGLYCERIN 0.4 MG SL SUBL
0.4000 mg | SUBLINGUAL_TABLET | Freq: Once | SUBLINGUAL | Status: AC
  Administered 2022-05-13: 0.4 mg via SUBLINGUAL
  Filled 2022-05-13: qty 1

## 2022-05-13 MED ORDER — FLUTICASONE PROPIONATE 50 MCG/ACT NA SUSP
1.0000 | Freq: Every day | NASAL | Status: DC
Start: 2022-05-13 — End: 2022-05-13
  Administered 2022-05-13: 1 via NASAL
  Filled 2022-05-13: qty 16

## 2022-05-13 MED ORDER — SPIRONOLACTONE 25 MG PO TABS
25.0000 mg | ORAL_TABLET | Freq: Every day | ORAL | Status: DC
  Administered 2022-05-13: 25 mg via ORAL
  Filled 2022-05-13: qty 1

## 2022-05-13 MED ORDER — OXYCODONE HCL 5 MG PO TABS: 5.0000 mg | ORAL_TABLET | Freq: Four times a day (QID) | ORAL | 0 refills | Status: DC | PRN

## 2022-05-13 MED ORDER — APIXABAN 2.5 MG PO TABS
2.5000 mg | ORAL_TABLET | Freq: Two times a day (BID) | ORAL | Status: DC
  Administered 2022-05-13: 2.5 mg via ORAL
  Filled 2022-05-13: qty 1

## 2022-05-13 MED ORDER — PANTOPRAZOLE SODIUM 40 MG PO TBEC
40.0000 mg | DELAYED_RELEASE_TABLET | Freq: Two times a day (BID) | ORAL | Status: DC
  Administered 2022-05-13: 40 mg via ORAL
  Filled 2022-05-13: qty 1

## 2022-05-13 MED ORDER — IOHEXOL 350 MG/ML SOLN
100.0000 mL | Freq: Once | INTRAVENOUS | Status: AC | PRN
  Administered 2022-05-13: 100 mL via INTRAVENOUS

## 2022-05-13 MED ORDER — LATANOPROST 0.005 % OP SOLN
1.0000 [drp] | Freq: Every day | OPHTHALMIC | Status: DC
Start: 2022-05-13 — End: 2022-05-13

## 2022-05-13 MED ORDER — OXYCODONE HCL 5 MG PO TABS
15.0000 mg | ORAL_TABLET | Freq: Four times a day (QID) | ORAL | Status: DC
  Administered 2022-05-13: 15 mg via ORAL
  Filled 2022-05-13: qty 3

## 2022-05-13 MED ORDER — FENTANYL CITRATE PF 50 MCG/ML IJ SOSY: 50.0000 ug | PREFILLED_SYRINGE | INTRAMUSCULAR | Status: DC | PRN

## 2022-05-13 MED ORDER — NALOXONE HCL 0.4 MG/ML IJ SOLN: 0.4000 mg | INTRAMUSCULAR | Status: DC | PRN

## 2022-05-13 MED ORDER — PERFLUTREN LIPID MICROSPHERE
1.0000 mL | INTRAVENOUS | Status: AC | PRN
  Administered 2022-05-13: 2 mL via INTRAVENOUS

## 2022-05-13 MED ORDER — LABETALOL HCL 5 MG/ML IV SOLN: 10.0000 mg | INTRAVENOUS | Status: DC | PRN

## 2022-05-13 MED ORDER — MORPHINE SULFATE (PF) 4 MG/ML IV SOLN
4.0000 mg | Freq: Once | INTRAVENOUS | Status: AC
  Administered 2022-05-13: 4 mg via INTRAVENOUS
  Filled 2022-05-13: qty 1

## 2022-05-13 MED ORDER — ATORVASTATIN CALCIUM 10 MG PO TABS
20.0000 mg | ORAL_TABLET | Freq: Every day | ORAL | Status: DC
  Administered 2022-05-13: 20 mg via ORAL
  Filled 2022-05-13: qty 2

## 2022-05-13 MED ORDER — FENTANYL CITRATE PF 50 MCG/ML IJ SOSY
100.0000 ug | PREFILLED_SYRINGE | Freq: Once | INTRAMUSCULAR | Status: AC
  Administered 2022-05-13: 100 ug via INTRAVENOUS
  Filled 2022-05-13: qty 2

## 2022-05-13 MED ORDER — ONDANSETRON HCL 4 MG/2ML IJ SOLN: 4.0000 mg | Freq: Four times a day (QID) | INTRAMUSCULAR | Status: DC | PRN

## 2022-05-13 MED ORDER — OXYCODONE HCL 5 MG PO TABS
5.0000 mg | ORAL_TABLET | Freq: Four times a day (QID) | ORAL | Status: DC
Start: 2022-05-13 — End: 2022-05-13

## 2022-05-13 MED ORDER — LORATADINE 10 MG PO TABS
10.0000 mg | ORAL_TABLET | Freq: Every day | ORAL | Status: DC
  Administered 2022-05-13: 10 mg via ORAL
  Filled 2022-05-13: qty 1

## 2022-05-13 NOTE — Assessment & Plan Note (Signed)
   #)   Essential hypertension: Documented history of such, on losartan, metoprolol succinate, spironolactone as an outpatient.  Systolic blood pressure is elevated into the 170s to 180s at presentation, some improvement with improving pain control following IV analgesia.   Plan: Continue outpatient antihypertensive regimen.

## 2022-05-13 NOTE — ED Notes (Signed)
US at bedside

## 2022-05-13 NOTE — Assessment & Plan Note (Signed)
   #)   GERD: Documented history of such, which is notable given the atypical nature of the patient's presenting chest pain.  On scheduled Pepcid as well as Protonix at home.  Plan: Resume home PPI and H2 blocker, with consideration for dose of GI cocktail with viscous lidocaine for diagnostic/therapeutic intervention.

## 2022-05-13 NOTE — ED Notes (Addendum)
Patient cc of headache, back pain, and chest pain. Rates pain 8/10.

## 2022-05-13 NOTE — ED Notes (Signed)
This Probation officer spoke to [employer].  He feels the Pt can go home after his Stat US.

## 2022-05-13 NOTE — ED Provider Notes (Signed)
[facility] EMERGENCY DEPARTMENT Provider Note   CSN: [id number] Arrival date & time: 05/12/22  2311     History  Chief Complaint  Patient presents with   Chest Pain    Cody Frazier is a 76 y.o. male.  HPI  Patient with medical history including A-fib currently on Eliquis, diastolic heart failure, pulmonary arterial hypertension, sleep apnea, hypertension, diabetes presents with complaints of chest pain.  Patient states chest pain started today, states it happened when he woke up, pain is in the middle of his chest, has remained constant, does not radiate to his back, describes as a pressure-like pain, states he has slight increase difficulty with breathing, but denies pleuritic chest pain, denies become diaphoretic lightheaded or dizziness no nausea or vomiting, pain is not worsened with exertion or position, he does note that he has back pain but he states this is chronic, back pain is mainly in his lower back, he denies any pain rating from the chest to the back does not describe the chest pain as stabbing or tearing like sensation.  States has been compliant with all of his medications, he notes that his blood pressure has been elevated last couple days he thinks is from  his back he took extra dose of his blood pressure medication.  He does states that he has a slight cough and general body aches, he is concerned that he might be suffering from a URI but is concerned about his heart chest as well.    Home Medications Prior to Admission medications   Medication Sig Start Date End Date Taking? Authorizing Provider  acetaminophen (TYLENOL) 500 MG tablet Take 1,000 mg by mouth every 6 (six) hours as needed for mild pain or moderate pain. Maximum daily dose of 3g    [provider]  atorvastatin (LIPITOR) 20 MG tablet TAKE 1 TABLET BY MOUTH EVERY DAY 07/10/21   Plotnikov, Evie Lacks, MD  cetirizine (ZYRTEC) 10 MG tablet Take 10 mg by mouth daily.    [provider]  Cholecalciferol (VITAMIN D3) 1.25 MG (50000 UT) CAPS TAKE 1 CAPSULE BY MOUTH EVERY 30 DAYS. 01/01/22   Plotnikov, Evie Lacks, MD  ELIQUIS 2.5 MG TABS tablet TAKE 1 TABLET BY MOUTH TWICE A DAY 11/16/21   Plotnikov, Evie Lacks, MD  famotidine (PEPCID) 40 MG tablet Take 1 tablet (40 mg total) by mouth at bedtime. 01/24/22   Ladene Artist, MD  fluticasone (FLONASE) 50 MCG/ACT nasal spray SPRAY 2 SPRAYS INTO EACH NOSTRIL EVERY DAY 11/16/21   Plotnikov, Evie Lacks, MD  furosemide (LASIX) 40 MG tablet Take 1 tablet (40 mg total) by mouth daily as needed for edema. TAKE $RemoveBef'40MG'uZYjWkuTOp$  DAILY x3 DAYS THEN TAKE $RemoveB'40MG'xLxtAryH$  EVERY OTHER DAY THEN BACK TO PRN 11/20/21   Troy Sine, MD  latanoprost (XALATAN) 0.005 % ophthalmic solution Place 1 drop into both eyes at bedtime. 03/16/17   [provider]  metoprolol succinate (TOPROL-XL) 25 MG 24 hr tablet TAKE 1 TABLET BY MOUTH EVERY DAY 03/12/22   Croitoru, Mihai, MD  oxyCODONE (ROXICODONE) 15 MG immediate release tablet Take 1 tablet (15 mg total) by mouth 4 (four) times daily. 04/16/22   Plotnikov, Evie Lacks, MD  oxyCODONE (ROXICODONE) 15 MG immediate release tablet Take 1 tablet (15 mg total) by mouth 4 (four) times daily. 04/16/22   Plotnikov, Evie Lacks, MD  oxyCODONE (ROXICODONE) 15 MG immediate release tablet Take 1 tablet (15 mg total) by mouth 4 (four) times daily. 04/16/22  Plotnikov, Evie Lacks, MD  pantoprazole (PROTONIX) 40 MG tablet Take 1 tablet (40 mg total) by mouth 2 (two) times daily. 01/24/22   Ladene Artist, MD  PRESCRIPTION MEDICATION Inhale into the lungs at bedtime. CPAP    [provider]  spironolactone (ALDACTONE) 25 MG tablet TAKE 1 TABLET BY MOUTH EVERY DAY 01/01/22   Croitoru, Mihai, MD  telmisartan (MICARDIS) 20 MG tablet TAKE 1 TABLET BY MOUTH EVERY DAY 12/14/21   Croitoru, Mihai, MD  timolol (TIMOPTIC) 0.25 % ophthalmic solution Place 1 drop into both eyes 2 (two) times daily.  06/07/19   [provider]  triamcinolone cream  (KENALOG) 0.1 % Apply 1 application topically 3 (three) times daily. 10/16/21   Plotnikov, Evie Lacks, MD      Allergies    Codeine phosphate, Nsaids, and Adhesive [tape]    Review of Systems   Review of Systems  Constitutional:  Negative for chills and fever.  Respiratory:  Positive for shortness of breath.   Cardiovascular:  Positive for chest pain.  Gastrointestinal:  Negative for abdominal pain.  Musculoskeletal:  Positive for back pain.  Neurological:  Negative for headaches.    Physical Exam Updated Vital Signs BP (!) 180/92   Pulse 71   Temp 98.1 F (36.7 C) (Oral)   Resp (!) 21   Ht $R'5\' 6"'QU$  (1.676 m)   Wt 108.9 kg   SpO2 97%   BMI 38.74 kg/m  Physical Exam Vitals and nursing note reviewed.  Constitutional:      General: He is not in acute distress.    Appearance: He is not ill-appearing.  HENT:     Head: Normocephalic and atraumatic.     Nose: No congestion.  Eyes:     Conjunctiva/sclera: Conjunctivae normal.  Neck:     Comments: No JVD Cardiovascular:     Rate and Rhythm: Normal rate and regular rhythm.     Pulses: Normal pulses.     Heart sounds: No murmur heard.    No friction rub. No gallop.  Pulmonary:     Effort: No respiratory distress.     Breath sounds: No wheezing, rhonchi or rales.  Abdominal:     Palpations: Abdomen is soft.     Tenderness: There is no abdominal tenderness. There is no right CVA tenderness or left CVA tenderness.  Musculoskeletal:     Right lower leg: No edema.     Left lower leg: No edema.     Comments: Patient has noted tenderness along his spine is focalized to reduce on his lower lumbar region, no step-off or deformities noted.  Skin:    General: Skin is warm and dry.  Neurological:     Mental Status: He is alert.  Psychiatric:        Mood and Affect: Mood normal.     ED Results / Procedures / Treatments   Labs (all labs ordered are listed, but only abnormal results are displayed) Labs Reviewed  COMPREHENSIVE  METABOLIC PANEL - Abnormal; Notable for the following components:      Result Value   Glucose, Bld 130 (*)    All other components within normal limits  CBC WITH DIFFERENTIAL/PLATELET  URINALYSIS, ROUTINE W REFLEX MICROSCOPIC  LIPASE, BLOOD  CBC WITH DIFFERENTIAL/PLATELET  COMPREHENSIVE METABOLIC PANEL  MAGNESIUM  TROPONIN I (HIGH SENSITIVITY)  TROPONIN I (HIGH SENSITIVITY)    EKG EKG Interpretation  Date/Time:  Saturday May 12 2022 23:18:53 EDT Ventricular Rate:  80 PR Interval:  178 QRS  Duration: 103 QT Interval:  372 QTC Calculation: 430 R Axis:   -3 Text Interpretation: Sinus rhythm Confirmed by Ripley Fraise [phone]) on 05/13/2022 12:39:35 AM  Radiology CT Angio Chest/Abd/Pel for Dissection W and/or Wo Contrast  Result Date: 05/13/2022 CLINICAL DATA:  Chest pain with nausea and shortness of breath. EXAM: CT ANGIOGRAPHY CHEST, ABDOMEN AND PELVIS TECHNIQUE: Non-contrast CT of the chest was initially obtained. Multidetector CT imaging through the chest, abdomen and pelvis was performed using the standard protocol during bolus administration of intravenous contrast. Multiplanar reconstructed images and MIPs were obtained and reviewed to evaluate the vascular anatomy. RADIATION DOSE REDUCTION: This exam was performed according to the departmental dose-optimization program which includes automated exposure control, adjustment of the mA and/or kV according to patient size and/or use of iterative reconstruction technique. CONTRAST:  139m OMNIPAQUE IOHEXOL 350 MG/ML SOLN COMPARISON:  August 13, 2020 FINDINGS: CTA CHEST FINDINGS Cardiovascular: There is mild calcification of the aortic arch, without evidence of aortic aneurysm or dissection. There is limited evaluation of the subsegmental pulmonary arteries secondary to areas of overlying artifact. This is most prominent within the bilateral lung bases (axial CT images 67 through 74, CT series 6). No evidence of pulmonary embolism. Normal  heart size with mild coronary artery calcification. No pericardial effusion. Mediastinum/Nodes: No enlarged mediastinal, hilar, or axillary lymph nodes. Thyroid gland, trachea, and esophagus demonstrate no significant findings. Lungs/Pleura: Mild atelectasis is seen within the right lower lobe. There is no evidence of acute infiltrate, pleural effusion or pneumothorax. Musculoskeletal: Multilevel degenerative changes are seen throughout the thoracic spine. Review of the MIP images confirms the above findings. CTA ABDOMEN AND PELVIS FINDINGS VASCULAR Aorta: Moderate to marked severity calcification of a normal caliber aorta without aneurysm, dissection, vasculitis or significant stenosis. Celiac: The celiac artery is duplicated, without evidence of aneurysm, dissection, vasculitis or significant stenosis. SMA: Patent without evidence of aneurysm, dissection, vasculitis or significant stenosis. Renals: Both renal arteries are patent without evidence of aneurysm, dissection, vasculitis, fibromuscular dysplasia or significant stenosis. IMA: Patent without evidence of aneurysm, dissection, vasculitis or significant stenosis. Inflow: Marked severity calcification without evidence of aneurysm, dissection, vasculitis or significant stenosis. Veins: No obvious venous abnormality within the limitations of this arterial phase study. Review of the MIP images confirms the above findings. NON-VASCULAR Hepatobiliary: No focal liver abnormality is seen. There is moderate to marked severity gallbladder distension, without evidence of gallstones or gallbladder wall thickening. The common bile duct is dilated and measures 12.4 mm in diameter. Pancreas: A mild amount of peripancreatic inflammatory fat stranding is seen along the anterior aspect of the junction of the pancreatic body and head. Spleen: Normal in size without focal abnormality. Adrenals/Urinary Tract: Adrenal glands are unremarkable. Kidneys are normal in size, without  obstructing renal calculi or hydronephrosis. 2.3 cm and 4.2 cm simple cysts are seen within the posterior aspect of the mid right kidney. A 1.1 cm simple cyst is seen within the anterolateral aspect of the mid to lower left kidney. No additional follow-up or imaging is recommended. Bladder is unremarkable. Stomach/Bowel: Stomach is within normal limits. The appendix is surgically absent. No evidence of bowel wall thickening, distention, or inflammatory changes. Noninflamed diverticula are seen throughout the large bowel. Lymphatic: No abnormal abdominal or pelvic lymph nodes are identified. Reproductive: Prostate is unremarkable. Other: No abdominal wall hernia or abnormality. No abdominopelvic ascites. Musculoskeletal: Extensive postoperative changes are seen throughout the lumbar spine. Review of the MIP images confirms the above findings. IMPRESSION: 1. No evidence of  pulmonary embolism. 2. Mild right lower lobe atelectasis. 3. Mild acute pancreatitis. Correlation with pancreatic enzymes is recommended. 4. Moderate to marked severity gallbladder distension without evidence of gallstones or gallbladder wall thickening. 5. Colonic diverticulosis. Aortic Atherosclerosis (ICD10-I70.0). Electronically Signed   By: Virgina Norfolk M.D.   On: 05/13/2022 02:06   DG Chest 2 View  Result Date: 05/13/2022 CLINICAL DATA:  Chest pain EXAM: CHEST - 2 VIEW COMPARISON:  08/13/2020 FINDINGS: Cardiac shadow is stable. Aortic calcifications are seen. The lungs are well aerated without focal infiltrate or effusion. Chronic compression deformities are noted in the lower thoracic spine. Postsurgical changes in the upper lumbar spine are noted as well. IMPRESSION: No acute abnormality noted. Electronically Signed   By: Inez Catalina M.D.   On: 05/13/2022 00:40    Procedures Procedures    Medications Ordered in ED Medications  acetaminophen (TYLENOL) tablet 650 mg (has no administration in time range)    Or  acetaminophen  (TYLENOL) suppository 650 mg (has no administration in time range)  naloxone (NARCAN) injection 0.4 mg (has no administration in time range)  fentaNYL (SUBLIMAZE) injection 50 mcg (has no administration in time range)  ondansetron (ZOFRAN) injection 4 mg (has no administration in time range)  labetalol (NORMODYNE) injection 10 mg (has no administration in time range)  morphine (PF) 4 MG/ML injection 4 mg (4 mg Intravenous Given 05/13/22 0016)  nitroGLYCERIN (NITROSTAT) SL tablet 0.4 mg (0.4 mg Sublingual Given 05/13/22 0017)  fentaNYL (SUBLIMAZE) injection 100 mcg (100 mcg Intravenous Given 05/13/22 0114)  iohexol (OMNIPAQUE) 350 MG/ML injection 100 mL (100 mLs Intravenous Contrast Given 05/13/22 0150)  fentaNYL (SUBLIMAZE) injection 50 mcg (50 mcg Intravenous Given 05/13/22 0314)    ED Course/ Medical Decision Making/ A&P                           Medical Decision Making Amount and/or Complexity of Data Reviewed Labs: ordered. Radiology: ordered.  Risk Prescription drug management. Decision regarding hospitalization.   This patient presents to the ED for concern of chest pain, this involves an extensive number of treatment options, and is a complaint that carries with it a high risk of complications and morbidity.  The differential diagnosis includes ACS, CHF, PE, pneumonia    Additional history obtained:  Additional history obtained from wife at bedside External records from outside source obtained and reviewed including cardiology notes   Co morbidities that complicate the patient evaluation  Diabetes, diastolic heart failure, A-fib  Social Determinants of Health:  N/A    Lab Tests:  I Ordered, and personally interpreted labs.  The pertinent results include: CBC unremarkable, CMP shows glucose of 130, first troponin is 14, second opponent is 17   Imaging Studies ordered:  I ordered imaging studies including chest x-ray, CT angiogram I independently visualized and  interpreted imaging which showed chest x-ray unremarkable, CT dissection study was negative for dissection, does show evidence of possible acute pancreatitis. I agree with the radiologist interpretation   Cardiac Monitoring:  The patient was maintained on a cardiac monitor.  I personally viewed and interpreted the cardiac monitored which showed an underlying rhythm of: EKG without signs of ischemia   Medicines ordered and prescription drug management:  I ordered medication including nitro, morphine I have reviewed the patients home medicines and have made adjustments as needed  Critical Interventions:  N/A   Reevaluation:  Presents with chest pain, unclear etiology, will obtain basic lab work-up, chest  x-ray, EKG, also provide him with additional dose of nitro as well as morphine and reassess.   Consultations Obtained:  I requested consultation with the hospitalist Dr. Velia Meyer,  and discussed lab and imaging findings as well as pertinent plan - they recommend: will admit the patient.    Test Considered:  N/A    Rule out I have low suspicion for cardiac ischemia as he has no EKG changes, no elevation in his troponins.  Low suspicion for PE or dissection as CT dissection is negative for this.  I have low suspicion for CHF exacerbation is not volume overloaded my exam no evidence of pleural effusion seen on imaging.  I have low suspicion for pancreatitis as he has no epigastric tenderness, no associate nausea or vomiting, will add on lipase for further evaluation.  Have low suspicion for liver or gallbladder malady as he has no right upper quadrant tenderness no elevation liver enzymes alk phos or T. bili.  Low suspicion for systemic infection as patient is nontoxic-appearing, vital signs reassuring, no obvious source infection noted on exam.     Dispostion and problem list  After consideration of the diagnostic results and the patients response to treatment, I feel that the  patent would benefit from admission.  Chest pain-unclear etiology but concern for possible unstable angina, most recent stress test was in 2017, recent echo was unremarkable, would recommend further cardiac work-up and formal consultation cardiology. Hypertension-no evidence of hypertensive emergency, based on further monitoring and treatment of this.            Final Clinical Impression(s) / ED Diagnoses Final diagnoses:  Unstable angina [facility])    Rx / DC Orders ED Discharge Orders     None         Marcello Fennel, PA-C 05/13/22 0341    Ripley Fraise, MD 05/13/22 [phone]

## 2022-05-13 NOTE — Care Management Obs Status (Signed)
[facility] NOTIFICATION   Patient Details  Name: Cody Frazier MRN: [id number] Date of Birth: February 27, 1946   Medicare Observation Status Notification Given:  Yes    Verdell Carmine, RN 05/13/2022, 11:37 AM

## 2022-05-13 NOTE — ED Notes (Signed)
Patient transported to ECHO.

## 2022-05-13 NOTE — Assessment & Plan Note (Signed)
   #)   Paroxysmal atrial fibrillation: Documented history of such. In setting of CHA2DS2-VASc score of 5, there is an indication for chronic anticoagulation for thromboembolic prophylaxis. Consistent with this, patient is chronically anticoagulated on Eliquis. Home AV nodal blocking regimen: Metoprolol succinate.  Most recent echocardiogram was performed in January 2023 and was notable for LVEF 01% grade 1 diastolic dysfunction and no evidence of significant valvular pathology. Presenting EKG demonstrates sinus rhythm without overt evidence of acute ischemic changes.    Plan: monitor strict I's & O's and daily weights. Repeat BMP/CBC in AM. Check serum mag level. Continue home AV nodal blocking regimen.  Continue outpatient Eliquis.  Monitor on symmetry.

## 2022-05-13 NOTE — Discharge Summary (Signed)
Physician Discharge Summary  Cody Frazier NFA:[id number] DOB: 1946-03-01 DOA: 05/12/2022  PCP: Cassandria Anger, MD  Admit date: 05/12/2022 Discharge date: 05/13/2022  Admitted From: Home Discharge disposition: Home  Recommendations at discharge:  Follow-up with neurosurgery as an outpatient  Follow-up with general surgery as an outpatient  Brief narrative: Cody Frazier is a 76 y.o. male with PMH significant for obesity, OSA, HTN, HLD, chronic diastolic CHF, A-fib/flutter on Eliquis, GERD, Barrett's esophagus, peptic ulcer disease, diverticulosis, chronic anemia, chronic back pain on opioids Patient presented to the ED on 7/1 with complaint of new onset acute chest pain radiating to left shoulder and back for about 2 hours prior to presentation.  Associated shortness of breath.  EMS gave sublingual nitroglycerin in route without improvement in chest pain.  Chest pain improved after IV fentanyl was given.  In the ED, patient was afebrile, heart rate in 70s, blood pressure elevated to 186/100 Troponin unremarkable. EKG showed sinus rhythm without evidence of T wave or ST changes, including no evidence of ST elevation.  Chest x-ray unremarkable CTA chest abdomen pelvis with dissection protocol rule out aortic aneurysm, dissection, acute PE.  It showed moderate gallbladder distention without cholelithiasis or signs of cholecystitis or CBD dilatation.    Subjective: Patient was seen and examined on the morning of 7/2.  Lying in bed.  Not in distress.  Left shoulder pain resolving.  Also pointed to midsternal/epigastric tenderness. Chart reviewed.  Blood pressure elevated to 180s overnight Labs unremarkable  Assessment and plan: Atypical chest pain/discomfort -Patient with new onset substernal chest discomfort radiating to left shoulder and back lasting for 2 hours prior to presentation.   -Negative troponin.  EKG without significant ST-T wave changes. -Pain did not improve with  nitroglycerin or aspirin but improved with IV fentanyl. -Echocardiogram did not show any wall motion abnormality.  Noncardiac etiology of pain.  Probably related to significant history of GERD/Barrett's esophagus.  GERD/Barrett's esophagus/history of peptic ulcer disease -On scheduled Pepcid as well as Protonix at home. -Continue same.  Also add Tums as needed. -No evidence of bleeding.  Hemoglobin stable. Recent Labs    07/13/21 1556 11/20/21 1426 05/12/22 2345 05/13/22 0313  HGB 13.1 13.4 14.1 13.8  MCV 95.1 93 95.5 94.6    Distended gallbladder -CT abdomen showed moderate gallbladder distention without cholelithiasis or signs of cholecystitis or CBD dilatation.   -Liver enzymes normal. -Discussed with general surgery Dr. Bobbye Morton.  Recommended right upper quadrant ultrasound.  It showed the same finding as well.  No need of inpatient intervention for at this time.  Follow-up with general surgery as an outpatient. Recent Labs  Lab 05/12/22 2345 05/13/22 0313  AST 17 18  ALT 15 15  ALKPHOS 86 76  BILITOT 0.8 0.9  PROT 6.8 6.4*  ALBUMIN 3.7 3.5   Chronic back pain on opioids -Patient states that he has history of lumbar surgery in the past and needs to follow-up with a surgeon at [facility].  The surgeon apparently has retired.  I put in a referral for new appointment with them as an outpatient. -For acute pain, I wrote a prescription for 20 tablets of Percocet at patient's request.  Chronic diastolic CHF Essential hypertension -PTA on Toprol 25 mg daily, telmisartan 75 mg daily, Aldactone 25 mg daily, Lasix 40 mg daily as needed -Blood pressure elevated to 180s overnight. -Continue all. -Echo with preserved EF  Paroxysmal A-fib -Continue Toprol -CHADSVASC score of 5.  Continue Eliquis  Hyperlipidemia -  Statin  Wounds:  - Incision (Closed) 05/10/17 Abdomen Other (Comment) (Active)  Date First Assessed/Time First Assessed: 05/10/17 2130   Location:  Abdomen  Location Orientation: Other (Comment)    Assessments 05/10/2017 10:00 PM 05/16/2017  8:00 AM  Dressing Type Honeycomb --  Dressing Intact --  Site / Wound Assessment Dressing in place / Unable to assess Clean;Dry  Margins -- Attached edges (approximated)  Closure -- Staples     No associated orders.     Pressure Injury 05/13/17 Stage II -  Partial thickness loss of dermis presenting as a shallow open ulcer with a red, pink wound bed without slough. blister from BP cuff (Active)  Date First Assessed/Time First Assessed: 05/13/17 0354   Location: Arm  Location Orientation: Right;Mid  Staging: Stage II -  Partial thickness loss of dermis presenting as a shallow open ulcer with a red, pink wound bed without slough.  Wound Descrip...    Assessments 05/13/2017  3:00 AM 05/14/2017 10:50 AM  Dressing Type None Foam  Dressing -- Clean;Dry;Intact  Dressing Change Frequency -- PRN  Site / Wound Assessment Other (Comment) --  Peri-wound Assessment Intact --  Drainage Amount None None     No associated orders.     EXTERNAL FIXATOR (Active)  Placement Date/Time: 01/06/19 1545   External Fixator Present on Admission?: (c) No  Site: Wrist  Laterality: Right  Number of Pins: (c) 3    No assessment data to display     No associated orders.     Incision (Closed) 01/06/19 Wrist Right (Active)  Date First Assessed/Time First Assessed: 01/06/19 1626   Location: Wrist  Location Orientation: Right    Assessments 01/06/2019  4:01 PM 01/06/2019  4:43 PM  Dressing Type Compression wrap Compression wrap  Dressing Clean;Dry;Intact Clean;Dry;Intact  Site / Wound Assessment Dressing in place / Unable to assess Dressing in place / Unable to assess  Drainage Amount None None     No associated orders.    Discharge Exam:   Vitals:   05/13/22 1200 05/13/22 1300 05/13/22 1430 05/13/22 1500  BP: (!) 165/94 (!) 161/105 (!) 147/82 (!) 146/88  Pulse: 78 83 69 74  Resp: '18 14 18 20  '$ Temp:      TempSrc:       SpO2: 95% 95% 95% 94%  Weight:      Height:        Body mass index is 38.74 kg/m.  General exam: Pleasant, elderly Caucasian male.  Not in physical distress Skin: No rashes, lesions or ulcers. HEENT: Atraumatic, normocephalic, no obvious bleeding Lungs: Clear to auscultation bilaterally CVS: Regular rate and rhythm, no murmur GI/Abd soft, mild epigastric tenderness, nondistended, bowel sound present CNS: Alert, awake, oriented x3 Psychiatry: Mood appropriate Extremities: No pedal edema, no calf tenderness  Follow ups:    Follow-up Information     [facility] Follow up.   Specialty: Neurosurgery Contact information: [place] [address] [phone]         Plotnikov, Evie Lacks, MD Follow up.   Specialty: Internal Medicine Contact information: [place] [address] [phone]         Sanda Klein, MD .   Specialty: Cardiology Contact information: [address] [place] [address] [phone]                 Discharge Instructions:   Discharge Instructions     Ambulatory referral to Neurosurgery  Complete by: As directed    [facility]   Call MD for:  difficulty breathing, headache or visual disturbances   Complete by: As directed    Call MD for:  extreme fatigue   Complete by: As directed    Call MD for:  hives   Complete by: As directed    Call MD for:  persistant dizziness or light-headedness   Complete by: As directed    Call MD for:  persistant nausea and vomiting   Complete by: As directed    Call MD for:  severe uncontrolled pain   Complete by: As directed    Call MD for:  temperature >100.4   Complete by: As directed    Diet general   Complete by: As directed    Discharge instructions   Complete by: As directed    General discharge instructions: Follow with Primary MD Plotnikov, Evie Lacks, MD in 7 days   Please request your PCP  to go over your hospital tests, procedures, radiology results at the follow up. Please get your medicines reviewed and adjusted.  Your PCP may decide to repeat certain labs or tests as needed. Do not drive, operate heavy machinery, perform activities at heights, swimming or participation in water activities or provide baby sitting services if your were admitted for syncope or siezures until you have seen by Primary MD or a Neurologist and advised to do so again. [place] Controlled Substance Reporting System database was reviewed. Do not drive, operate heavy machinery, perform activities at heights, swim, participate in water activities or provide baby-sitting services while on medications for pain, sleep and mood until your outpatient physician has reevaluated you and advised to do so again.  You are strongly recommended to comply with the dose, frequency and duration of prescribed medications. Activity: As tolerated with Full fall precautions use walker/cane & assistance as needed Avoid using any recreational substances like cigarette, tobacco, alcohol, or non-prescribed drug. If you experience worsening of your admission symptoms, develop shortness of breath, life threatening emergency, suicidal or homicidal thoughts you must seek medical attention immediately by calling 911 or calling your MD immediately  if symptoms less severe. You must read complete instructions/literature along with all the possible adverse reactions/side effects for all the medicines you take and that have been prescribed to you. Take any new medicine only after you have completely understood and accepted all the possible adverse reactions/side effects.  Wear Seat belts while driving. You were cared for by a hospitalist during your hospital stay. If you have any questions about your discharge medications or the care you received while you were in the hospital after you are discharged, you can call  the unit and ask to speak with the hospitalist or the covering physician. Once you are discharged, your primary care physician will handle any further medical issues. Please note that NO REFILLS for any discharge medications will be authorized once you are discharged, as it is imperative that you return to your primary care physician (or establish a relationship with a primary care physician if you do not have one).   Increase activity slowly   Complete by: As directed        Discharge Medications:   Allergies as of 05/13/2022       Reactions   Codeine Phosphate Swelling   Nsaids Other (See Comments)   ulcers   Adhesive [tape] Rash   ekg strips        Medication List  TAKE these medications    acetaminophen 500 MG tablet Commonly known as: TYLENOL Take 1,000 mg by mouth every 6 (six) hours as needed for mild pain or moderate pain. Maximum daily dose of 3g   atorvastatin 20 MG tablet Commonly known as: LIPITOR TAKE 1 TABLET BY MOUTH EVERY DAY   cetirizine 10 MG tablet Commonly known as: ZYRTEC Take 10 mg by mouth daily.   Eliquis 2.5 MG Tabs tablet Generic drug: apixaban TAKE 1 TABLET BY MOUTH TWICE A DAY What changed: how much to take   famotidine 40 MG tablet Commonly known as: PEPCID Take 1 tablet (40 mg total) by mouth at bedtime.   fluticasone 50 MCG/ACT nasal spray Commonly known as: FLONASE SPRAY 2 SPRAYS INTO EACH NOSTRIL EVERY DAY   furosemide 40 MG tablet Commonly known as: LASIX Take 1 tablet (40 mg total) by mouth daily as needed for edema. TAKE '40MG'$  DAILY x3 DAYS THEN TAKE '40MG'$  EVERY OTHER DAY THEN BACK TO PRN   latanoprost 0.005 % ophthalmic solution Commonly known as: XALATAN Place 1 drop into both eyes at bedtime.   metoprolol succinate 25 MG 24 hr tablet Commonly known as: TOPROL-XL TAKE 1 TABLET BY MOUTH EVERY DAY   oxyCODONE 5 MG immediate release tablet Commonly known as: Oxy IR/ROXICODONE Take 1 tablet (5 mg total) by mouth every 6  (six) hours as needed for up to 5 days for severe pain. What changed:  medication strength how much to take when to take this reasons to take this Another medication with the same name was removed. Continue taking this medication, and follow the directions you see here.   pantoprazole 40 MG tablet Commonly known as: PROTONIX Take 1 tablet (40 mg total) by mouth 2 (two) times daily.   PRESCRIPTION MEDICATION Inhale into the lungs at bedtime. CPAP   spironolactone 25 MG tablet Commonly known as: ALDACTONE TAKE 1 TABLET BY MOUTH EVERY DAY   telmisartan 20 MG tablet Commonly known as: MICARDIS TAKE 1 TABLET BY MOUTH EVERY DAY   timolol 0.25 % ophthalmic solution Commonly known as: TIMOPTIC Place 1 drop into both eyes 2 (two) times daily.   triamcinolone cream 0.1 % Commonly known as: KENALOG Apply 1 application topically 3 (three) times daily.   Vitamin D3 1.25 MG (50000 UT) Caps TAKE 1 CAPSULE BY MOUTH EVERY 30 DAYS.         The results of significant diagnostics from this hospitalization (including imaging, microbiology, ancillary and laboratory) are listed below for reference.    Procedures and Diagnostic Studies:   US Abdomen Limited RUQ (LIVER/GB)  Result Date: 05/13/2022 CLINICAL DATA:  Follow-up from abnormal CT scan. EXAM: ULTRASOUND ABDOMEN LIMITED RIGHT UPPER QUADRANT COMPARISON:  CTA chest, abdomen and pelvis, 05/13/2022 at 4:37 a.m. FINDINGS: Gallbladder: Significantly distended.  No wall thickening.  No stone. Common bile duct: Diameter: 6 mm Liver: Limited visualization. No abnormality. Portal vein is patent on color Doppler imaging with normal direction of blood flow towards the liver. Other: None. IMPRESSION: 1. Limited exam due to bowel gas. 2. Distended gallbladder, but no wall thickening or pericholecystic fluid or stones. No sonographic evidence of acute cholecystitis. 3. No bile duct dilation. Electronically Signed   By: Lajean Manes M.D.   On: 05/13/2022  15:03   ECHOCARDIOGRAM COMPLETE  Result Date: 05/13/2022    ECHOCARDIOGRAM REPORT   Patient Name:   Cody Frazier Date of Exam: 05/13/2022 Medical Rec #:  [id number]       Height:  66.0 in Accession #:    [id number]      Weight:       240.0 lb Date of Birth:  October 25, 1946      BSA:          2.161 m Patient Age:    86 years        BP:           150/88 mmHg Patient Gender: M               HR:           77 bpm. Exam Location:  Inpatient Procedure: 2D Echo, Color Doppler, Cardiac Doppler and Intracardiac            Opacification Agent Indications:    R07.9* Chest pain, unspecified  History:        Patient has prior history of Echocardiogram examinations, most                 recent 11/22/2021. CHF, Arrythmias:Atrial Fibrillation; Risk                 Factors:Hypertension, Dyslipidemia and Sleep Apnea.  Sonographer:    Raquel Sarna Senior RDCS Referring Phys: [id number] Rhetta Mura  Sonographer Comments: Technically difficult due to poor echo windows. IMPRESSIONS  1. Left ventricular ejection fraction, by estimation, is 60 to 65%. The left ventricle has normal function. The left ventricle has no regional wall motion abnormalities. Left ventricular diastolic parameters are consistent with Grade I diastolic dysfunction (impaired relaxation).  2. Right ventricular systolic function was not well visualized. The right ventricular size is not well visualized.  3. The mitral valve is normal in structure. No evidence of mitral valve regurgitation. No evidence of mitral stenosis.  4. The aortic valve is tricuspid. Aortic valve regurgitation is not visualized. Aortic valve sclerosis/calcification is present, without any evidence of aortic stenosis. FINDINGS  Left Ventricle: Left ventricular ejection fraction, by estimation, is 60 to 65%. The left ventricle has normal function. The left ventricle has no regional wall motion abnormalities. Definity contrast agent was given IV to delineate the left ventricular  endocardial borders.  The left ventricular internal cavity size was normal in size. There is no left ventricular hypertrophy. Left ventricular diastolic parameters are consistent with Grade I diastolic dysfunction (impaired relaxation). Right Ventricle: The right ventricular size is not well visualized. Right ventricular systolic function was not well visualized. Left Atrium: Left atrial size was normal in size. Right Atrium: Right atrial size was normal in size. Pericardium: There is no evidence of pericardial effusion. Mitral Valve: The mitral valve is normal in structure. No evidence of mitral valve regurgitation. No evidence of mitral valve stenosis. Tricuspid Valve: The tricuspid valve is normal in structure. Tricuspid valve regurgitation is not demonstrated. No evidence of tricuspid stenosis. Aortic Valve: The aortic valve is tricuspid. Aortic valve regurgitation is not visualized. Aortic valve sclerosis/calcification is present, without any evidence of aortic stenosis. Pulmonic Valve: The pulmonic valve was normal in structure. Pulmonic valve regurgitation is not visualized. No evidence of pulmonic stenosis. Aorta: The aortic root is normal in size and structure. Venous: The inferior vena cava was not well visualized. IAS/Shunts: The interatrial septum was not well visualized.  LEFT VENTRICLE PLAX 2D LVIDd:         3.30 cm   Diastology LVIDs:         2.20 cm   LV e' medial:    3.92 cm/s LV PW:  1.00 cm   LV E/e' medial:  13.2 LV IVS:        1.10 cm   LV e' lateral:   8.16 cm/s LVOT diam:     2.30 cm   LV E/e' lateral: 6.4 LV SV:         98 LV SV Index:   45 LVOT Area:     4.15 cm  RIGHT VENTRICLE RV S prime:     12.00 cm/s TAPSE (M-mode): 2.5 cm LEFT ATRIUM             Index        RIGHT ATRIUM           Index LA diam:        3.60 cm 1.67 cm/m   RA Area:     11.60 cm LA Vol (A2C):   60.5 ml 28.00 ml/m  RA Volume:   23.30 ml  10.78 ml/m LA Vol (A4C):   48.8 ml 22.58 ml/m LA Biplane Vol: 55.5 ml 25.68 ml/m  AORTIC  VALVE LVOT Vmax:   114.00 cm/s LVOT Vmean:  89.300 cm/s LVOT VTI:    0.235 m  AORTA Ao Root diam: 3.70 cm Ao Asc diam:  3.60 cm MITRAL VALVE MV Area (PHT): 2.90 cm    SHUNTS MV Decel Time: 262 msec    Systemic VTI:  0.24 m MV E velocity: 51.90 cm/s  Systemic Diam: 2.30 cm MV A velocity: 86.50 cm/s MV E/A ratio:  0.60 Kirk Ruths MD Electronically signed by Kirk Ruths MD Signature Date/Time: 05/13/2022/2:11:53 PM    Final    CT Angio Chest/Abd/Pel for Dissection W and/or Wo Contrast  Result Date: 05/13/2022 CLINICAL DATA:  Chest pain with nausea and shortness of breath. EXAM: CT ANGIOGRAPHY CHEST, ABDOMEN AND PELVIS TECHNIQUE: Non-contrast CT of the chest was initially obtained. Multidetector CT imaging through the chest, abdomen and pelvis was performed using the standard protocol during bolus administration of intravenous contrast. Multiplanar reconstructed images and MIPs were obtained and reviewed to evaluate the vascular anatomy. RADIATION DOSE REDUCTION: This exam was performed according to the departmental dose-optimization program which includes automated exposure control, adjustment of the mA and/or kV according to patient size and/or use of iterative reconstruction technique. CONTRAST:  120m OMNIPAQUE IOHEXOL 350 MG/ML SOLN COMPARISON:  August 13, 2020 FINDINGS: CTA CHEST FINDINGS Cardiovascular: There is mild calcification of the aortic arch, without evidence of aortic aneurysm or dissection. There is limited evaluation of the subsegmental pulmonary arteries secondary to areas of overlying artifact. This is most prominent within the bilateral lung bases (axial CT images 67 through 74, CT series 6). No evidence of pulmonary embolism. Normal heart size with mild coronary artery calcification. No pericardial effusion. Mediastinum/Nodes: No enlarged mediastinal, hilar, or axillary lymph nodes. Thyroid gland, trachea, and esophagus demonstrate no significant findings. Lungs/Pleura: Mild atelectasis is  seen within the right lower lobe. There is no evidence of acute infiltrate, pleural effusion or pneumothorax. Musculoskeletal: Multilevel degenerative changes are seen throughout the thoracic spine. Review of the MIP images confirms the above findings. CTA ABDOMEN AND PELVIS FINDINGS VASCULAR Aorta: Moderate to marked severity calcification of a normal caliber aorta without aneurysm, dissection, vasculitis or significant stenosis. Celiac: The celiac artery is duplicated, without evidence of aneurysm, dissection, vasculitis or significant stenosis. SMA: Patent without evidence of aneurysm, dissection, vasculitis or significant stenosis. Renals: Both renal arteries are patent without evidence of aneurysm, dissection, vasculitis, fibromuscular dysplasia or significant stenosis. IMA: Patent without evidence of aneurysm, dissection,  vasculitis or significant stenosis. Inflow: Marked severity calcification without evidence of aneurysm, dissection, vasculitis or significant stenosis. Veins: No obvious venous abnormality within the limitations of this arterial phase study. Review of the MIP images confirms the above findings. NON-VASCULAR Hepatobiliary: No focal liver abnormality is seen. There is moderate to marked severity gallbladder distension, without evidence of gallstones or gallbladder wall thickening. The common bile duct is dilated and measures 12.4 mm in diameter. Pancreas: A mild amount of peripancreatic inflammatory fat stranding is seen along the anterior aspect of the junction of the pancreatic body and head. Spleen: Normal in size without focal abnormality. Adrenals/Urinary Tract: Adrenal glands are unremarkable. Kidneys are normal in size, without obstructing renal calculi or hydronephrosis. 2.3 cm and 4.2 cm simple cysts are seen within the posterior aspect of the mid right kidney. A 1.1 cm simple cyst is seen within the anterolateral aspect of the mid to lower left kidney. No additional follow-up or  imaging is recommended. Bladder is unremarkable. Stomach/Bowel: Stomach is within normal limits. The appendix is surgically absent. No evidence of bowel wall thickening, distention, or inflammatory changes. Noninflamed diverticula are seen throughout the large bowel. Lymphatic: No abnormal abdominal or pelvic lymph nodes are identified. Reproductive: Prostate is unremarkable. Other: No abdominal wall hernia or abnormality. No abdominopelvic ascites. Musculoskeletal: Extensive postoperative changes are seen throughout the lumbar spine. Review of the MIP images confirms the above findings. IMPRESSION: 1. No evidence of pulmonary embolism. 2. Mild right lower lobe atelectasis. 3. Mild acute pancreatitis. Correlation with pancreatic enzymes is recommended. 4. Moderate to marked severity gallbladder distension without evidence of gallstones or gallbladder wall thickening. 5. Colonic diverticulosis. Aortic Atherosclerosis (ICD10-I70.0). Electronically Signed   By: Virgina Norfolk M.D.   On: 05/13/2022 02:06   DG Chest 2 View  Result Date: 05/13/2022 CLINICAL DATA:  Chest pain EXAM: CHEST - 2 VIEW COMPARISON:  08/13/2020 FINDINGS: Cardiac shadow is stable. Aortic calcifications are seen. The lungs are well aerated without focal infiltrate or effusion. Chronic compression deformities are noted in the lower thoracic spine. Postsurgical changes in the upper lumbar spine are noted as well. IMPRESSION: No acute abnormality noted. Electronically Signed   By: Inez Catalina M.D.   On: 05/13/2022 00:40     Labs:   Basic Metabolic Panel: Recent Labs  Lab 05/12/22 2345 05/13/22 0313  NA 141 142  K 4.1 4.2  CL 105 108  CO2 23 24  GLUCOSE 130* 120*  BUN 11 11  CREATININE 1.17 1.07  CALCIUM 10.0 9.8  MG  --  2.0   GFR Estimated Creatinine Clearance: 68 mL/min (by C-G formula based on SCr of 1.07 mg/dL). Liver Function Tests: Recent Labs  Lab 05/12/22 2345 05/13/22 0313  AST 17 18  ALT 15 15  ALKPHOS 86  76  BILITOT 0.8 0.9  PROT 6.8 6.4*  ALBUMIN 3.7 3.5   Recent Labs  Lab 05/13/22 0313  LIPASE 23   No results for input(s): "AMMONIA" in the last 168 hours. Coagulation profile No results for input(s): "INR", "PROTIME" in the last 168 hours.  CBC: Recent Labs  Lab 05/12/22 2345 05/13/22 0313  WBC 8.7 9.1  NEUTROABS 6.7 6.5  HGB 14.1 13.8  HCT 42.1 40.4  MCV 95.5 94.6  PLT 304 291   Cardiac Enzymes: No results for input(s): "CKTOTAL", "CKMB", "CKMBINDEX", "TROPONINI" in the last 168 hours. BNP: Invalid input(s): "POCBNP" CBG: No results for input(s): "GLUCAP" in the last 168 hours. D-Dimer No results for input(s): "DDIMER" in  the last 72 hours. Hgb A1c No results for input(s): "HGBA1C" in the last 72 hours. Lipid Profile No results for input(s): "CHOL", "HDL", "LDLCALC", "TRIG", "CHOLHDL", "LDLDIRECT" in the last 72 hours. Thyroid function studies No results for input(s): "TSH", "T4TOTAL", "T3FREE", "THYROIDAB" in the last 72 hours.  Invalid input(s): "FREET3" Anemia work up No results for input(s): "VITAMINB12", "FOLATE", "FERRITIN", "TIBC", "IRON", "RETICCTPCT" in the last 72 hours. Microbiology No results found for this or any previous visit (from the past 240 hour(s)).  Time coordinating discharge: 35 minutes  Signed: Waris Rodger  [facility] 05/14/2022, 7:47 AM

## 2022-05-13 NOTE — Assessment & Plan Note (Signed)
  #)   Hyperlipidemia: Atorvastatin 20 mg p.o. daily as an outpatient.  Plan: Resume home atorvastatin

## 2022-05-13 NOTE — Assessment & Plan Note (Signed)
 #)   Chronic diastolic heart failure: documented history of such, with most recent echocardiogram performed in January 2023, notable for grade 1 diastolic dysfunction and additional details as conveyed above. No clinical or radiographic evidence to suggest acutely decompensated heart failure at this time. home diuretic regimen reportedly consists of the following: Spironolactone.    Plan: monitor strict I's & O's and daily weights. Repeat BMP in AM. Check serum mag level. Continue home diuretic regimen. Will follow for results of echocardiogram that is been ordered for this morning as component of evaluation of presenting chest pain.

## 2022-05-13 NOTE — H&P (Signed)
History and Physical    PLEASE NOTE THAT DRAGON DICTATION SOFTWARE WAS USED IN THE CONSTRUCTION OF THIS NOTE.   Cody Frazier YSA:[id number] DOB: Dec 06, 1945 DOA: 05/12/2022  PCP: Cassandria Anger, MD  Patient coming from: home   I have personally briefly reviewed patient's old medical records in [place]  Chief Complaint: Chest pain  HPI: Cody Frazier is a 76 y.o. male with medical history significant for paroxysmal atrial fibrillation chronically anticoagulated on Eliquis, chronic back pain in setting of degenerative disc disease on chronic opioid therapy, GERD, hyperlipidemia, hypertension,, chronic diastolic heart failure, who is admitted to [facility] on 05/12/2022 with atypical chest pain after presenting from home to [facility] ED complaining of chest pain.   The patient reports new onset substernal chest pressure radiating to the left shoulder and into the back starting approximately 2100 on 05/12/2022.  Pain has been constant since that time, but has been nonexertional, nonpleuritic, nonpositional, not reproducible with direct patient over the anterior chest wall.  He notes some associated shortness of breath in the absence of any orthopnea, PND, or worsening of peripheral edema.  No associated hemoptysis, new calf tenderness or new lower extremity erythema.  Denies any associated abdominal discomfort, nausea, vomiting, diarrhea, melena, hematochezia.  No associated any palpitations diaphoresis, dizziness Recently, or syncope.  No recent trauma or travel.  He is chronically anticoagulated on Eliquis in the setting of history of proximal atrial fibrillation.  No associated any subjective fever, chills, rigors, or generalized myalgias.  No recent cough.  In the setting of persistence of the patient's chest pain, he contacted EMS, was brought to [facility] emergency department for further evaluation management of his chest discomfort.  In route to the ED he received sublingual  nitroglycerin x2 doses, noting [place] no improvement in the intensity of his chest pain with 2 doses of sublingual nitroglycerin.  EMS also administered a full dose aspirin x1.  He does however note significant improvement in his chest pain with IV fentanyl.  Has a document history of chronic diastolic  Most recent echocardiogram in January 2023 notable for LVEF 60 to 65%, no focal wall motion abnormalities, grade 1 diastolic dysfunction, normal right ventricular systolic function no evidence of any valvular pathology.  He notes that his most recent stress test occurred in 2017.      ED Course:   Labs were notable for the following: Initial troponin 16, and 17.  EKG showed sinus rhythm without evidence of T wave or ST changes, including no evidence of ST elevation.  2 view chest x-ray showed no evidence of acute cardiopulmonary process.  Radiation of the patient's chest pain into the back, CTA chest abdomen pelvis with dissection protocol was pursued and showed no evidence of aortic aneurysm or dissection, as well as no evidence of acute pulmonary embolism, while showing mild right lower lobe atelectasis, but otherwise no evidence of acute cardiopulmonary process.  Skin was also notable for moderate gallbladder distention but in the absence of any evidence of gallstones, and no evidence of gallbladder wall thickening, pericholecystic fluid, choledocholithiasis, or evidence of common bile duct dilation.  While in the ED, the following were administered: Fentanyl 50 mcg IV x1, 5100 mcg of Denzer, morphine 4 mg IV x1, and sublingual nitroglycerin 0.4 mg x 1.  Subsequently, the patient was admitted for overnight observation for further evaluation management of his presenting chest pain.   Review of Systems: As per HPI otherwise 10 point review of systems  negative.   Past Medical History:  Diagnosis Date   Allergic rhinitis    Anemia, iron deficiency    Arthritis    Atrial arrhythmia     Atrial fib/flutter, transient    following prior surgeries x 2   Barrett's esophagus without dysplasia    BPH (benign prostatic hyperplasia)    Bright's disease    Chronic LBP    Closed fracture of right distal radius    DDD (degenerative disc disease)    Diverticulosis of colon    Duodenal stricture    Gastric outlet obstruction    GERD (gastroesophageal reflux disease)    Glaucoma    Hemorrhoids    History of kidney stones    Hyperlipidemia    Hyperplastic colonic polyp 01/2000   Hypertension    Nephrolithiasis    hx of B   Peptic ulcer disease with hemorrhage 08/2008   and [facility]. Pylori Ab negative   Renal cyst    Sleep apnea with use of continuous positive airway pressure (CPAP)    Tubular adenoma of colon 03/2012   Wears glasses     Past Surgical History:  Procedure Laterality Date   APPENDECTOMY     BACK SURGERY  02/2003   L5   BILROTH I PROCEDURE  2011   Dr Johney Maine   CLOSED REDUCTION WRIST FRACTURE Right 01/06/2019   Procedure: Closed Reduction Wrist/Pinning Wrist;  Surgeon: Dayna Barker, MD;  Location: [facility];  Service: Plastics;  Laterality: Right;   COLONOSCOPY     COLONOSCOPY WITH PROPOFOL N/A 08/14/2020   Procedure: COLONOSCOPY WITH PROPOFOL;  Surgeon: [name], MD;  Location: [facility] ENDOSCOPY;  Service: Endoscopy;  Laterality: N/A;   HIATAL HERNIA REPAIR     LAPAROTOMY N/A 05/10/2017   Procedure: EXPLORATORY LAPAROTOMY WITH ENTEROLYSIS;  Surgeon: Johnathan Hausen, MD;  Location: [facility] ORS;  Service: General;  Laterality: N/A;   LUMBAR FUSION  2009   SMALL INTESTINE SURGERY     TONSILLECTOMY     UPPER GASTROINTESTINAL ENDOSCOPY  07/26/2020    Social History:  reports that he has quit smoking. He has never used smokeless tobacco. He reports that he does not drink alcohol and does not use drugs.   Allergies  Allergen Reactions   Codeine Phosphate Swelling   Nsaids Other (See Comments)    ulcers   Adhesive [Tape] Rash    ekg strips    Family History   Problem Relation Age of Onset   Colon polyps Mother    Colon cancer Mother 71   Heart disease Mother    Colon polyps Father    Colon cancer Father 75   Prostate cancer Father    Stroke Father    Colon cancer Paternal Uncle 61   Colon cancer Paternal Uncle 44   Hypertension Other    Colon cancer Cousin    Drug abuse Sister        overdose   Stomach cancer Neg Hx    Esophageal cancer Neg Hx    Pancreatic cancer Neg Hx    Liver disease Neg Hx     Family history reviewed and not pertinent    Prior to Admission medications   Medication Sig Start Date End Date Taking? Authorizing Provider  acetaminophen (TYLENOL) 500 MG tablet Take 1,000 mg by mouth every 6 (six) hours as needed for mild pain or moderate pain. Maximum daily dose of 3g    [provider]  atorvastatin (LIPITOR) 20 MG tablet TAKE 1 TABLET  BY MOUTH EVERY DAY 07/10/21   Plotnikov, Evie Lacks, MD  cetirizine (ZYRTEC) 10 MG tablet Take 10 mg by mouth daily.    [provider]  Cholecalciferol (VITAMIN D3) 1.25 MG (50000 UT) CAPS TAKE 1 CAPSULE BY MOUTH EVERY 30 DAYS. 01/01/22   Plotnikov, Evie Lacks, MD  ELIQUIS 2.5 MG TABS tablet TAKE 1 TABLET BY MOUTH TWICE A DAY 11/16/21   Plotnikov, Evie Lacks, MD  famotidine (PEPCID) 40 MG tablet Take 1 tablet (40 mg total) by mouth at bedtime. 01/24/22   Ladene Artist, MD  fluticasone (FLONASE) 50 MCG/ACT nasal spray SPRAY 2 SPRAYS INTO EACH NOSTRIL EVERY DAY 11/16/21   Plotnikov, Evie Lacks, MD  furosemide (LASIX) 40 MG tablet Take 1 tablet (40 mg total) by mouth daily as needed for edema. TAKE '40MG'$  DAILY x3 DAYS THEN TAKE '40MG'$  EVERY OTHER DAY THEN BACK TO PRN 11/20/21   Troy Sine, MD  latanoprost (XALATAN) 0.005 % ophthalmic solution Place 1 drop into both eyes at bedtime. 03/16/17   [provider]  metoprolol succinate (TOPROL-XL) 25 MG 24 hr tablet TAKE 1 TABLET BY MOUTH EVERY DAY 03/12/22   Croitoru, Mihai, MD  oxyCODONE (ROXICODONE) 15 MG immediate release  tablet Take 1 tablet (15 mg total) by mouth 4 (four) times daily. 04/16/22   Plotnikov, Evie Lacks, MD  oxyCODONE (ROXICODONE) 15 MG immediate release tablet Take 1 tablet (15 mg total) by mouth 4 (four) times daily. 04/16/22   Plotnikov, Evie Lacks, MD  oxyCODONE (ROXICODONE) 15 MG immediate release tablet Take 1 tablet (15 mg total) by mouth 4 (four) times daily. 04/16/22   Plotnikov, Evie Lacks, MD  pantoprazole (PROTONIX) 40 MG tablet Take 1 tablet (40 mg total) by mouth 2 (two) times daily. 01/24/22   Ladene Artist, MD  PRESCRIPTION MEDICATION Inhale into the lungs at bedtime. CPAP    [provider]  spironolactone (ALDACTONE) 25 MG tablet TAKE 1 TABLET BY MOUTH EVERY DAY 01/01/22   Croitoru, Mihai, MD  telmisartan (MICARDIS) 20 MG tablet TAKE 1 TABLET BY MOUTH EVERY DAY 12/14/21   Croitoru, Mihai, MD  timolol (TIMOPTIC) 0.25 % ophthalmic solution Place 1 drop into both eyes 2 (two) times daily.  06/07/19   [provider]  triamcinolone cream (KENALOG) 0.1 % Apply 1 application topically 3 (three) times daily. 10/16/21   Plotnikov, Evie Lacks, MD     Objective    Physical Exam: Vitals:   05/13/22 0300 05/13/22 0330 05/13/22 0407 05/13/22 0411  BP: (!) 170/90 (!) 180/92    Pulse: 73 71 73 74  Resp: 19 (!) '21 19 19  '$ Temp:      TempSrc:      SpO2: 95% 97% 95% 95%  Weight:      Height:        General: appears to be stated age; alert, oriented Skin: warm, dry, no rash Head:  AT/Rudy Mouth:  Oral mucosa membranes appear moist, normal dentition Neck: supple; trachea midline Heart:  RRR; did not appreciate any M/R/G Lungs: CTAB, did not appreciate any wheezes, rales, or rhonchi Abdomen: + BS; soft, ND, NT Vascular: 2+ pedal pulses b/l; 2+ radial pulses b/l Extremities: no peripheral edema, no muscle wasting Neuro: strength and sensation intact in upper and lower extremities b/l     Labs on Admission: I have personally reviewed following labs and imaging  studies  CBC: Recent Labs  Lab 05/12/22 2345 05/13/22 0313  WBC 8.7 9.1  NEUTROABS 6.7 6.5  HGB 14.1 13.8  HCT 42.1 40.4  MCV 95.5 94.6  PLT 304 284   Basic Metabolic Panel: Recent Labs  Lab 05/12/22 2345 05/13/22 0313  NA 141 142  K 4.1 4.2  CL 105 108  CO2 23 24  GLUCOSE 130* 120*  BUN 11 11  CREATININE 1.17 1.07  CALCIUM 10.0 9.8  MG  --  2.0   GFR: Estimated Creatinine Clearance: 68 mL/min (by C-G formula based on SCr of 1.07 mg/dL). Liver Function Tests: Recent Labs  Lab 05/12/22 2345 05/13/22 0313  AST 17 18  ALT 15 15  ALKPHOS 86 76  BILITOT 0.8 0.9  PROT 6.8 6.4*  ALBUMIN 3.7 3.5   Recent Labs  Lab 05/13/22 0313  LIPASE 23   No results for input(s): "AMMONIA" in the last 168 hours. Coagulation Profile: No results for input(s): "INR", "PROTIME" in the last 168 hours. Cardiac Enzymes: No results for input(s): "CKTOTAL", "CKMB", "CKMBINDEX", "TROPONINI" in the last 168 hours. BNP (last 3 results) No results for input(s): "PROBNP" in the last 8760 hours. HbA1C: No results for input(s): "HGBA1C" in the last 72 hours. CBG: No results for input(s): "GLUCAP" in the last 168 hours. Lipid Profile: No results for input(s): "CHOL", "HDL", "LDLCALC", "TRIG", "CHOLHDL", "LDLDIRECT" in the last 72 hours. Thyroid Function Tests: No results for input(s): "TSH", "T4TOTAL", "FREET4", "T3FREE", "THYROIDAB" in the last 72 hours. Anemia Panel: No results for input(s): "VITAMINB12", "FOLATE", "FERRITIN", "TIBC", "IRON", "RETICCTPCT" in the last 72 hours. Urine analysis:    Component Value Date/Time   COLORURINE STRAW (A) 05/13/2022 0313   APPEARANCEUR CLEAR 05/13/2022 0313   LABSPEC 1.011 05/13/2022 0313   PHURINE 6.0 05/13/2022 0313   GLUCOSEU NEGATIVE 05/13/2022 0313   GLUCOSEU NEGATIVE 07/13/2021 1556   HGBUR SMALL (A) 05/13/2022 0313   BILIRUBINUR NEGATIVE 05/13/2022 0313   KETONESUR NEGATIVE 05/13/2022 0313   PROTEINUR NEGATIVE 05/13/2022 0313    UROBILINOGEN 0.2 07/13/2021 1556   NITRITE NEGATIVE 05/13/2022 0313   LEUKOCYTESUR NEGATIVE 05/13/2022 0313    Radiological Exams on Admission: CT Angio Chest/Abd/Pel for Dissection W and/or Wo Contrast  Result Date: 05/13/2022 CLINICAL DATA:  Chest pain with nausea and shortness of breath. EXAM: CT ANGIOGRAPHY CHEST, ABDOMEN AND PELVIS TECHNIQUE: Non-contrast CT of the chest was initially obtained. Multidetector CT imaging through the chest, abdomen and pelvis was performed using the standard protocol during bolus administration of intravenous contrast. Multiplanar reconstructed images and MIPs were obtained and reviewed to evaluate the vascular anatomy. RADIATION DOSE REDUCTION: This exam was performed according to the departmental dose-optimization program which includes automated exposure control, adjustment of the mA and/or kV according to patient size and/or use of iterative reconstruction technique. CONTRAST:  128m OMNIPAQUE IOHEXOL 350 MG/ML SOLN COMPARISON:  August 13, 2020 FINDINGS: CTA CHEST FINDINGS Cardiovascular: There is mild calcification of the aortic arch, without evidence of aortic aneurysm or dissection. There is limited evaluation of the subsegmental pulmonary arteries secondary to areas of overlying artifact. This is most prominent within the bilateral lung bases (axial CT images 67 through 74, CT series 6). No evidence of pulmonary embolism. Normal heart size with mild coronary artery calcification. No pericardial effusion. Mediastinum/Nodes: No enlarged mediastinal, hilar, or axillary lymph nodes. Thyroid gland, trachea, and esophagus demonstrate no significant findings. Lungs/Pleura: Mild atelectasis is seen within the right lower lobe. There is no evidence of acute infiltrate, pleural effusion or pneumothorax. Musculoskeletal: Multilevel degenerative changes are seen throughout the thoracic spine. Review of the MIP images confirms the above  findings. CTA ABDOMEN AND PELVIS FINDINGS  VASCULAR Aorta: Moderate to marked severity calcification of a normal caliber aorta without aneurysm, dissection, vasculitis or significant stenosis. Celiac: The celiac artery is duplicated, without evidence of aneurysm, dissection, vasculitis or significant stenosis. SMA: Patent without evidence of aneurysm, dissection, vasculitis or significant stenosis. Renals: Both renal arteries are patent without evidence of aneurysm, dissection, vasculitis, fibromuscular dysplasia or significant stenosis. IMA: Patent without evidence of aneurysm, dissection, vasculitis or significant stenosis. Inflow: Marked severity calcification without evidence of aneurysm, dissection, vasculitis or significant stenosis. Veins: No obvious venous abnormality within the limitations of this arterial phase study. Review of the MIP images confirms the above findings. NON-VASCULAR Hepatobiliary: No focal liver abnormality is seen. There is moderate to marked severity gallbladder distension, without evidence of gallstones or gallbladder wall thickening. The common bile duct is dilated and measures 12.4 mm in diameter. Pancreas: A mild amount of peripancreatic inflammatory fat stranding is seen along the anterior aspect of the junction of the pancreatic body and head. Spleen: Normal in size without focal abnormality. Adrenals/Urinary Tract: Adrenal glands are unremarkable. Kidneys are normal in size, without obstructing renal calculi or hydronephrosis. 2.3 cm and 4.2 cm simple cysts are seen within the posterior aspect of the mid right kidney. A 1.1 cm simple cyst is seen within the anterolateral aspect of the mid to lower left kidney. No additional follow-up or imaging is recommended. Bladder is unremarkable. Stomach/Bowel: Stomach is within normal limits. The appendix is surgically absent. No evidence of bowel wall thickening, distention, or inflammatory changes. Noninflamed diverticula are seen throughout the large bowel. Lymphatic: No abnormal  abdominal or pelvic lymph nodes are identified. Reproductive: Prostate is unremarkable. Other: No abdominal wall hernia or abnormality. No abdominopelvic ascites. Musculoskeletal: Extensive postoperative changes are seen throughout the lumbar spine. Review of the MIP images confirms the above findings. IMPRESSION: 1. No evidence of pulmonary embolism. 2. Mild right lower lobe atelectasis. 3. Mild acute pancreatitis. Correlation with pancreatic enzymes is recommended. 4. Moderate to marked severity gallbladder distension without evidence of gallstones or gallbladder wall thickening. 5. Colonic diverticulosis. Aortic Atherosclerosis (ICD10-I70.0). Electronically Signed   By: Virgina Norfolk M.D.   On: 05/13/2022 02:06   DG Chest 2 View  Result Date: 05/13/2022 CLINICAL DATA:  Chest pain EXAM: CHEST - 2 VIEW COMPARISON:  08/13/2020 FINDINGS: Cardiac shadow is stable. Aortic calcifications are seen. The lungs are well aerated without focal infiltrate or effusion. Chronic compression deformities are noted in the lower thoracic spine. Postsurgical changes in the upper lumbar spine are noted as well. IMPRESSION: No acute abnormality noted. Electronically Signed   By: Inez Catalina M.D.   On: 05/13/2022 00:40     EKG: Independently reviewed, with result as described above.    Assessment/Plan   Principal Problem:   Atypical chest pain Active Problems:   Dyslipidemia   GERD   Paroxysmal atrial fibrillation (HCC)   Essential hypertension   Chronic diastolic CHF (congestive heart failure) (HCC)      #) Atypical chest pain: New onset substernal chest pressure radiating into the left shoulder and back starting originally 2100 last night, nonexertional, and without improvement with sublingual nitroglycerin will noting significant treatment with IV opioid therapy.  Atypical for ACS, however given multiple CAD risk factors, will admit for overnight observation for further ACS rule out.  Troponin x2  nonelevated.  EKG is sinus rhythm without evidence of acute ischemic changes, including no evidence of ST elevation.  Chest x-ray without acute cardiopulmonary process,  while CTA chest with dissection protocol showed no evidence of aortic aneurysm dissection, or acute cardiopulmonary process.  Etiology unclear at this time.  Given moderate to severe gallbladder distention, differential would include biliary colic given the absence of reproducible abdominal discomfort on exam but with radiation to the scapula and between the shoulder blades.  If ACS rule out and sees, may consider additional abdominal imaging to further evaluate this possibility.  Of note, full dose aspirin administered via EMS this evening.  Plan: Trend troponin.  Echocardiogram ordered for the morning.  Monitor on symmetry.  At this time magnesium level. Lipase level.  Prn IV Dilaudid Likely intolerance to opioid medications given scheduled oxycodone as an outpatient.  Continue outpatient atorvastatin, beta-blocker, telmisartan.          #) Essential hypertension: Documented history of such, on losartan, metoprolol succinate, spironolactone as an outpatient.  Systolic blood pressure is elevated into the 170s to 180s at presentation, some improvement with improving pain control following IV analgesia.   Plan: Continue outpatient antihypertensive regimen.          #) Paroxysmal atrial fibrillation: Documented history of such. In setting of CHA2DS2-VASc score of 5, there is an indication for chronic anticoagulation for thromboembolic prophylaxis. Consistent with this, patient is chronically anticoagulated on Eliquis. Home AV nodal blocking regimen: Metoprolol succinate.  Most recent echocardiogram was performed in January 2023 and was notable for LVEF 09% grade 1 diastolic dysfunction and no evidence of significant valvular pathology. Presenting EKG demonstrates sinus rhythm without overt evidence of acute ischemic changes.     Plan: monitor strict I's & O's and daily weights. Repeat BMP/CBC in AM. Check serum mag level. Continue home AV nodal blocking regimen.  Continue outpatient Eliquis.  Monitor on symmetry.            #) Chronic diastolic heart failure: documented history of such, with most recent echocardiogram performed in January 2023, notable for grade 1 diastolic dysfunction and additional details as conveyed above. No clinical or radiographic evidence to suggest acutely decompensated heart failure at this time. home diuretic regimen reportedly consists of the following: Spironolactone.    Plan: monitor strict I's & O's and daily weights. Repeat BMP in AM. Check serum mag level. Continue home diuretic regimen. Will follow for results of echocardiogram that is been ordered for this morning as component of evaluation of presenting chest pain.          #) GERD: Documented history of such, which is notable given the atypical nature of the patient's presenting chest pain.  On scheduled Pepcid as well as Protonix at home.  Plan: Resume home PPI and H2 blocker, with consideration for dose of GI cocktail with viscous lidocaine for diagnostic/therapeutic intervention.          #) Hyperlipidemia: Atorvastatin 20 mg p.o. daily as an outpatient.  Plan: Resume home atorvastatin        DVT prophylaxis: SCD's + home Eliquis Code Status: Full code Family Communication: I discussed the patient's case with his wife, who is present at bedside Disposition Plan: Per Rounding Team Consults called: none;  Admission status: Observation; cardiac telemetry   PLEASE NOTE THAT DRAGON DICTATION SOFTWARE WAS USED IN THE CONSTRUCTION OF THIS NOTE.   [place] DO [facility]  From [place]   05/13/2022, 5:37 AM

## 2022-05-13 NOTE — Assessment & Plan Note (Signed)
 #)   Atypical chest pain: New onset substernal chest pressure radiating into the left shoulder and back starting originally 2100 last night, nonexertional, and without improvement with sublingual nitroglycerin will noting significant treatment with IV opioid therapy.  Atypical for ACS, however given multiple CAD risk factors, will admit for overnight observation for further ACS rule out.  Troponin x2 nonelevated.  EKG is sinus rhythm without evidence of acute ischemic changes, including no evidence of ST elevation.  Chest x-ray without acute cardiopulmonary process, while CTA chest with dissection protocol showed no evidence of aortic aneurysm dissection, or acute cardiopulmonary process.  Etiology unclear at this time.  Given moderate to severe gallbladder distention, differential would include biliary colic given the absence of reproducible abdominal discomfort on exam but with radiation to the scapula and between the shoulder blades.  If ACS rule out and sees, may consider additional abdominal imaging to further evaluate this possibility.  Of note, full dose aspirin administered via EMS this evening.  Plan: Trend troponin.  Echocardiogram ordered for the morning.  Monitor on symmetry.  At this time magnesium level. Lipase level.  Prn IV Dilaudid Likely intolerance to opioid medications given scheduled oxycodone as an outpatient.  Continue outpatient atorvastatin, beta-blocker, telmisartan.

## 2022-05-16 NOTE — Progress Notes (Signed)
Cardiology Office Note:    Date:  05/18/2022   ID:  Cody Frazier, DOB 05/01/1946, MRN [id number]  PCP:  Cody Anger, MD   [facility] Providers Cardiologist:  Cody Klein, MD      Referring MD: Cody Anger, MD   Follow-up evaluation of chest pain  History of Present Illness:     Cody Frazier is a PMH of hypertensive heart disease without CHF, paroxysmal atrial fibrillation, aortic atherosclerosis, pulmonary hypertension, nonrheumatic aortic valve stenosis, essential hypertension, OSA, GERD, dyslipidemia, iron deficiency anemia, DOE, obesity, memory difficulties, Barrett's esophagus, gastric ulcers and anxiety.   He was diagnosed with severe anemia with a hemoglobin of 3.5.  He received transfusions and was discharged with a hemoglobin of 9.3.  The increase in hemoglobin after they have his symptoms of weakness and increased his stamina.  However, he still noted mild dyspnea.  He has been monitoring his blood pressures and reported blood pressures in the 160s over 80s-low 90s.  And endoscopic evaluation showed evidence of gastritis, but no source of bleeding.  A capsule enteroscopy was unsuccessful due to the camera pill never leaving the stomach.   He was noted to have some lower extremity edema but denied orthopnea and PND.  He reported compliance with his CPAP and denied anginal symptoms.  He also denied palpitations, dizziness, syncope, claudication, and neurological sequela.  He did note some poor short-term memory.   He had a repeat EGD 9/15 for surveillance of Barrett's esophagus.  In addition to his segments of Barrett's esophagus there was evidence of gastritis and nonbleeding duodenal diverticulum and evidence of previous fundoplasty.  No active bleeding was identified.   He was  seen by Cody Frazier on 09/06/2020.  His EKG showed normal sinus rhythm with a QTC of 446 ms.  His blood pressure was elevated at 162/92 and his pulse was 83.  Cody Frazier  2-3.  He was noted to have some bilateral lower extremity edema with an 8-10 pound weight gain.  His furosemide was increased to 60 mg daily and Spironolactone was added.  His telmisartan was also increased to 80 mg daily.   He presented to the clinic 10/18/20 for follow-up evaluation and stated he felt well .  His blood pressure continued to be somewhat labile on December 1 he noted his blood pressure was 172/73.  He took a dose of his 40 mg telmisartan which lowered his blood pressure to 97/70.  He reported that this blood pressure decrease made him feel dizzy/lightheaded.  His other blood pressures had been in the 110s over 60s-70s since that time.  He had been very strict with the salt in his diet.  He was somewhat limited in his physical activity due to chronic back pain.  He reported that he just traveled to [place] with his family and had a wonderful time.  He did not weigh himself daily but stated that he monitored for lower extremity edema.  I  decreased his telmisartan to 20 mg and  instructed him to take it for a blood pressure at or greater than 401 systolic.  He may take a second dose if he has no response to the medication.  I will gave him the salty 6 diet sheet, the [place] support stocking sheet, asked him weigh himself daily and call the office with a weight increase of 3 pounds overnight or 5 pounds in 1 week.  We  ordered a BMP  and planned follow-up in  3 months and as needed.  He followed up with Cody Frazier on December 14, 2020.  He reported he felt improved after the correction of his anemia.  His blood pressure was in the 120s-130s over 60s-70s with a heart rate of 60-70.  He reported that he would occasionally feel lightheaded with standing up.  His telmisartan was discontinued.  Contacted nurse triage line on 06/07/2021.  He reported some chest pain that would last about 15 minutes.  Recent increased shortness of breath.  He requested a sooner appointment.  He presented to the clinic  06/14/2021 for evaluation stated he felt well.  He had 1 episode of chest discomfort which subsided with rest.  He reported that he was not as physically active as he felt he should be however with increased bouts of physical activity he had not had any further chest discomfort.  He had not noticed any episodes of bleeding.  He continued to enjoy traveling with his wife.  His EKG  showed normal sinus rhythm 71 bpm with no acute changes.  I  asked him to increase his physical activity as tolerated and continue to follow a heart healthy low-sodium diet.  We planned follow-up with Dr. Loletha Frazier in February.  He was seen in follow-up by Cody Frazier on 01/31/2022.  During that time he reported compliance with his CPAP.  He had gained weight and his BMI had increased.  He reported being short of breath when he would get up first thing in the morning however it would improve throughout the day.  He was limited in his physical activity due to back pain.  He had not taken any extra doses of furosemide in the previous 1-2 months.  He denied palpitations dizziness syncope.  He presented to the hospital on 05/12/2022 and was discharged on 05/14/2022.  He was noted to have unstable angina.  He reported new onset of acute chest discomfort that radiated to his left shoulder and back.  His discomfort has been present for around 2 hours prior to his presentation to the emergency department.  He did note associated shortness of breath.  He was given sublingual nitroglycerin by EMS which did not improve his chest discomfort.  His discomfort improved with IV fentanyl.  In the emergency department his heart rate was 70 with a blood pressure of 186/100.  His troponins were unremarkable.  His EKG showed sinus rhythm with no evidence of ST or T wave elevation.  His chest x-ray was unremarkable.  His CTA chest abdomen pelvis showed no aortic aneurysm, dissection or acute PE.  He was noted to have moderate gallbladder distention without cholelithiasis  or signs of cholecystitis.  He was felt to have atypical chest pain.  His echocardiogram at that time did not show any wall motion abnormalities and it was felt that his discomfort was noncardiac.  It was felt that his discomfort was related to Barrett's esophagus and GERD.  He was instructed to take Pepcid as well as Protonix and also add Tums.  There is no evidence of bleeding and his hemoglobin remained stable.  He presents to clinic today for follow-up evaluation states he had a stressful visit in the emergency department.  He reports loud swearing patient's and back pain, headaches as well while he was there.  After he left and was at home he noted low blood pressures in the 80s over 60s.  While in the emergency department his blood pressure was elevated.  His blood pressure in the clinic  today is 124/76 and has been well controlled over the last few days.  I reviewed the importance of maintaining good p.o. hydration.  He expressed understanding.  He continues to maintain a low-sodium diet but is limited in his physical activity due to his back pain.  His son and daughter-in-law continue to live in [place].  I will have him continue to maintain a blood pressure log, increase his physical activity as tolerated, maintain p.o. hydration, and plan follow-up for 3 to 4 months.  Today he denies chest pain, shortness of breath, lower extremity edema, fatigue, palpitations, melena, hematuria, hemoptysis, diaphoresis, weakness, presyncope, syncope, orthopnea, and PND.    Past Medical History:  Diagnosis Date   Allergic rhinitis    Anemia, iron deficiency    Arthritis    Atrial arrhythmia    Atrial fib/flutter, transient    following prior surgeries x 2   Barrett's esophagus without dysplasia    BPH (benign prostatic hyperplasia)    Bright's disease    Chronic LBP    Closed fracture of right distal radius    DDD (degenerative disc disease)    Diverticulosis of colon    Duodenal stricture    Gastric  outlet obstruction    GERD (gastroesophageal reflux disease)    Glaucoma    Hemorrhoids    History of kidney stones    Hyperlipidemia    Hyperplastic colonic polyp 01/2000   Hypertension    Nephrolithiasis    hx of B   Peptic ulcer disease with hemorrhage 08/2008   and [facility]. Pylori Ab negative   Renal cyst    Sleep apnea with use of continuous positive airway pressure (CPAP)    Tubular adenoma of colon 03/2012   Wears glasses     Past Surgical History:  Procedure Laterality Date   APPENDECTOMY     BACK SURGERY  02/2003   L5   BILROTH I PROCEDURE  2011   Dr Johney Maine   CLOSED REDUCTION WRIST FRACTURE Right 01/06/2019   Procedure: Closed Reduction Wrist/Pinning Wrist;  Surgeon: Dayna Barker, MD;  Location: [place];  Service: Plastics;  Laterality: Right;   COLONOSCOPY     COLONOSCOPY WITH PROPOFOL N/A 08/14/2020   Procedure: COLONOSCOPY WITH PROPOFOL;  Surgeon: [name], MD;  Location: [facility] ENDOSCOPY;  Service: Endoscopy;  Laterality: N/A;   HIATAL HERNIA REPAIR     LAPAROTOMY N/A 05/10/2017   Procedure: EXPLORATORY LAPAROTOMY WITH ENTEROLYSIS;  Surgeon: Johnathan Hausen, MD;  Location: [facility];  Service: General;  Laterality: N/A;   LUMBAR FUSION  2009   SMALL INTESTINE SURGERY     TONSILLECTOMY     UPPER GASTROINTESTINAL ENDOSCOPY  07/26/2020    Current Medications: Current Meds  Medication Sig   acetaminophen (TYLENOL) 500 MG tablet Take 1,000 mg by mouth every 6 (six) hours as needed for mild pain or moderate pain. Maximum daily dose of 3g   atorvastatin (LIPITOR) 20 MG tablet TAKE 1 TABLET BY MOUTH EVERY DAY   cetirizine (ZYRTEC) 10 MG tablet Take 10 mg by mouth daily.   Cholecalciferol (VITAMIN D3) 1.25 MG (50000 UT) CAPS TAKE 1 CAPSULE BY MOUTH EVERY 30 DAYS.   ELIQUIS 2.5 MG TABS tablet TAKE 1 TABLET BY MOUTH TWICE A DAY (Patient taking differently: Take 2.5 mg by mouth 2 (two) times daily.)   famotidine (PEPCID) 40 MG tablet Take 1 tablet (40 mg total) by mouth at  bedtime.   fluticasone (FLONASE) 50 MCG/ACT nasal spray SPRAY 2 SPRAYS INTO  EACH NOSTRIL EVERY DAY   furosemide (LASIX) 40 MG tablet Take 1 tablet (40 mg total) by mouth daily as needed for edema. TAKE '40MG'$  DAILY x3 DAYS THEN TAKE '40MG'$  EVERY OTHER DAY THEN BACK TO PRN   latanoprost (XALATAN) 0.005 % ophthalmic solution Place 1 drop into both eyes at bedtime.   metoprolol succinate (TOPROL-XL) 25 MG 24 hr tablet TAKE 1 TABLET BY MOUTH EVERY DAY (Patient taking differently: Take 25 mg by mouth daily.)   oxyCODONE (OXY IR/ROXICODONE) 5 MG immediate release tablet Take 1 tablet (5 mg total) by mouth every 6 (six) hours as needed for up to 5 days for severe pain.   pantoprazole (PROTONIX) 40 MG tablet Take 1 tablet (40 mg total) by mouth 2 (two) times daily.   spironolactone (ALDACTONE) 25 MG tablet TAKE 1 TABLET BY MOUTH EVERY DAY (Patient taking differently: Take 25 mg by mouth daily.)   telmisartan (MICARDIS) 20 MG tablet TAKE 1 TABLET BY MOUTH EVERY DAY   timolol (TIMOPTIC) 0.25 % ophthalmic solution Place 1 drop into both eyes 2 (two) times daily.    triamcinolone cream (KENALOG) 0.1 % Apply 1 application topically 3 (three) times daily.     Allergies:   Codeine phosphate, Nsaids, and Adhesive [tape]   Social History   Socioeconomic History   Marital status: Married    Spouse name: Cody Frazier   Number of children: 1   Years of education: Not on file   Highest education level: Not on file  Occupational History   Occupation: PASTOR    Employer: [employer]  Tobacco Use   Smoking status: Former   Smokeless tobacco: Never   Tobacco comments:    as a teenager  Vaping Use   Vaping Use: Never used  Substance and Sexual Activity   Alcohol use: No   Drug use: No   Sexual activity: Yes  Other Topics Concern   Not on file  Social History Narrative   Patient gets regular exercise   Married x 42 years   Social Determinants of Health   Financial Resource Strain: Low Risk   (12/25/2021)   Overall Financial Resource Strain (CARDIA)    Difficulty of Paying Living Expenses: Not hard at all  Food Insecurity: No Food Insecurity (12/25/2021)   Hunger Vital Sign    Worried About Running Out of Food in the Last Year: Never true    Ran Out of Food in the Last Year: Never true  Transportation Needs: No Transportation Needs (12/25/2021)   PRAPARE - Hydrologist (Medical): No    Lack of Transportation (Non-Medical): No  Physical Activity: Inactive (12/25/2021)   Exercise Vital Sign    Days of Exercise per Week: 0 days    Minutes of Exercise per Session: 0 min  Stress: No Stress Concern Present (12/25/2021)   [place]    Feeling of Stress : Not at all  Social Connections: [place] (12/25/2021)   Social Connection and Isolation Panel [NHANES]    Frequency of Communication with Friends and Family: More than three times a week    Frequency of Social Gatherings with Friends and Family: More than three times a week    Attends Religious Services: More than 4 times per year    Active Member of [employer] or Organizations: Yes    Attends Music therapist: More than 4 times per year    Marital Status: Married  Family History: The patient's family history includes Colon cancer in his cousin; Colon cancer (age of onset: 84) in his paternal uncle and paternal uncle; Colon cancer (age of onset: 20) in his father; Colon cancer (age of onset: 1) in his mother; Colon polyps in his father and mother; Drug abuse in his sister; Heart disease in his mother; Hypertension in an other family member; Prostate cancer in his father; Stroke in his father. There is no history of Stomach cancer, Esophageal cancer, Pancreatic cancer, or Liver disease.  ROS:   Please see the history of present illness.     All other systems reviewed and are negative.   Risk Assessment/Calculations:            Physical Exam:    VS:  BP 124/76   Pulse 85   Ht '5\' 6"'$  (1.676 m)   Wt 240 lb 6.4 oz (109 kg)   SpO2 96%   BMI 38.80 kg/m     Wt Readings from Last 3 Encounters:  05/18/22 240 lb 6.4 oz (109 kg)  05/12/22 240 lb (108.9 kg)  04/16/22 239 lb (108.4 kg)     GEN: Well nourished, well developed in no acute distress HEENT: Normal NECK: No JVD; No carotid bruits LYMPHATICS: No lymphadenopathy CARDIAC: RRR, no murmurs, rubs, gallops, generalized bilateral lower extremity nonpitting edema RESPIRATORY:  Clear to auscultation without rales, wheezing or rhonchi  ABDOMEN: Soft, non-tender, non-distended MUSCULOSKELETAL:  No edema; No deformity  SKIN: Warm and dry NEUROLOGIC:  Alert and oriented x 3 PSYCHIATRIC:  Normal affect    EKGs/Labs/Other Studies Reviewed:    The following studies were reviewed today: Echocardiogram 08/03/2020   IMPRESSIONS     1. Left ventricular ejection fraction, by estimation, is 60 to 65%. The  left ventricle has normal function. The left ventricle has no regional  wall motion abnormalities. Left ventricular diastolic parameters are  consistent with Grade I diastolic  dysfunction (impaired relaxation).   2. Right ventricular systolic function is normal. The right ventricular  size is normal. There is mildly elevated pulmonary artery systolic  pressure. The estimated right ventricular systolic pressure is 24.4 mmHg.   3. The mitral valve is normal in structure. Trivial mitral valve  regurgitation. No evidence of mitral stenosis.   4. The aortic valve is tricuspid. Aortic valve regurgitation is not  visualized. Mild aortic valve stenosis. Aortic valve area, by VTI measures  1.54 cm. Aortic valve mean gradient measures 12.0 mmHg.   5. The inferior vena cava is normal in size with greater than 50%  respiratory variability, suggesting right atrial pressure of 3 mmHg.   Echocardiogram 05/13/2022  IMPRESSIONS     1. Left ventricular  ejection fraction, by estimation, is 60 to 65%. The  left ventricle has normal function. The left ventricle has no regional  wall motion abnormalities. Left ventricular diastolic parameters are  consistent with Grade I diastolic  dysfunction (impaired relaxation).   2. Right ventricular systolic function was not well visualized. The right  ventricular size is not well visualized.   3. The mitral valve is normal in structure. No evidence of mitral valve  regurgitation. No evidence of mitral stenosis.   4. The aortic valve is tricuspid. Aortic valve regurgitation is not  visualized. Aortic valve sclerosis/calcification is present, without any  evidence of aortic stenosis.   EKG: EKG is not ordered today.  EKG 06/14/2021  The ekg ordered today demonstrates normal sinus rhythm 71 bpm  Recent Labs: 07/13/2021: TSH 0.96 05/13/2022:  ALT 15; BUN 11; Creatinine, Ser 1.07; Hemoglobin 13.8; Magnesium 2.0; Platelets 291; Potassium 4.2; Sodium 142  Recent Lipid Panel    Component Value Date/Time   CHOL 196 11/20/2018 1532   TRIG 269.0 (H) 11/20/2018 1532   TRIG 231 (HH) 09/13/2006 0957   HDL 43.60 11/20/2018 1532   CHOLHDL 4 11/20/2018 1532   VLDL 53.8 (H) 11/20/2018 1532   LDLCALC 86 11/25/2017 1021   LDLDIRECT 109.0 11/20/2018 1532    ASSESSMENT & PLAN     Chest discomfort-denies chest pain/discomfort today.  Denies arm and neck pain.  Had  episode of chest discomfort 05/12/2022 and presented to emergency department.  He was ruled out for ACS and was found to have GERD versus Barrett's esophagus.  Pepcid, Protonix, and as needed Tums were recommended.  Echocardiogram 05/13/2022 showed normal LVEF, G1 DD and no significant valvular abnormalities. No plans for ischemic evaluation.  Chronic diastolic CHF-generalized bilateral lower extremity nonpitting edema.  No increased activity intolerance or DOE.  NYHA class II-3 echocardiogram 05/13/2022 showed an LVEF of 60 to 65%, G1 DD, and no significant  valvular abnormalities.  Continue furosemide, spironolactone, metoprolol, diltiazem Heart healthy low-sodium diet Daily weights Elevate lower extremities when not active   Essential hypertension-BP today 124/76.  Somewhat labile at home.  Noted occasional blood pressures in the 80s over 60s.  Reviewed the importance of good p.o. hydration. Continue diltiazem, furosemide, metoprolol, spironolactone, telmisartan. Heart healthy low-sodium diet-salty 6 given Increase physical activity as tolerated Maintain blood pressure log  Atrial fibrillation-EKG today shows normal sinus rhythm 85 bpm.  Denies irregular heartbeats or accelerated heart rate..    CHA2DS2-VASc score 2-3 Continue apixaban Heart healthy low-sodium diet-salty 6 given Increase physical activity as tolerated  Obstructive sleep apnea-reports compliance with CPAP.  Waking up well rested.   Continue CPAP use    Disposition: Follow-up with Cody Frazier in 3-4 months      Medication Adjustments/Labs and Tests Ordered: Current medicines are reviewed at length with the patient today.  Concerns regarding medicines are outlined above.  Orders Placed This Encounter  Procedures   EKG 12-Lead    No orders of the defined types were placed in this encounter.    There are no Patient Instructions on file for this visit.   Signed, Deberah Pelton, NP  05/18/2022 11:33 AM         Notice: This dictation was prepared with Dragon dictation along with smaller phrase technology. Any transcriptional errors that result from this process are unintentional and may not be corrected upon review.  I spent 12 minutes examining this patient, reviewing medications, and using patient centered shared decision making involving her cardiac care.  Prior to her visit I spent greater than 20 minutes reviewing her past medical history,  medications, and prior cardiac tests.

## 2022-05-18 ENCOUNTER — Encounter: Payer: Self-pay | Admitting: General Practice

## 2022-05-18 ENCOUNTER — Ambulatory Visit (INDEPENDENT_AMBULATORY_CARE_PROVIDER_SITE_OTHER): Payer: Medicare Other | Admitting: General Practice

## 2022-05-18 VITALS — BP 124/76 | HR 85 | Ht 66.0 in | Wt 240.4 lb

## 2022-05-18 DIAGNOSIS — R0789 Other chest pain: Secondary | ICD-10-CM | POA: Diagnosis not present

## 2022-05-18 DIAGNOSIS — Z9989 Dependence on other enabling machines and devices: Secondary | ICD-10-CM

## 2022-05-18 DIAGNOSIS — I480 Paroxysmal atrial fibrillation: Secondary | ICD-10-CM | POA: Diagnosis not present

## 2022-05-18 DIAGNOSIS — I10 Essential (primary) hypertension: Secondary | ICD-10-CM

## 2022-05-18 DIAGNOSIS — G4733 Obstructive sleep apnea (adult) (pediatric): Secondary | ICD-10-CM

## 2022-05-18 DIAGNOSIS — I5032 Chronic diastolic (congestive) heart failure: Secondary | ICD-10-CM | POA: Diagnosis not present

## 2022-05-18 NOTE — Patient Instructions (Signed)
Medication Instructions:  The current medical regimen is effective;  continue present plan and medications as directed. Please refer to the Current Medication list given to you today.  *If you need a refill on your cardiac medications before your next appointment, please call your pharmacy*  Lab Work:   Testing/Procedures:  NONE    NONE  If you have labs (blood work) drawn today and your tests are completely normal, you will receive your results only by: [place] (if you have MyChart) OR  A paper copy in the mail If you have any lab test that is abnormal or we need to change your treatment, we will call you to review the results.  Special Instructions PLEASE READ AND FOLLOW SALTY 6-ATTACHED-1,'800mg'$  daily  PLEASE MAINTAIN PHYSICAL ACTIVITY AS TOLERATED   DISCUSS YOUR SWOLLEN GALLBLADDER WITH YOUR PRIMARY CARE  TAKE AND LOG YOUR BLOOD PRESSURE  IF YOUR BLOOD PRESSURE IS LOW, DRINK AN ADDITIONAL 8-12 OUNCES  Follow-Up: Your next appointment:  3-4 month(s) In Person with Sanda Klein, MD   At [facility], you and your health needs are our priority.  As part of our continuing mission to provide you with exceptional heart care, we have created designated Provider Care Teams.  These Care Teams include your primary Cardiologist (physician) and Advanced Practice Providers (APPs -  Physician Assistants and Nurse Practitioners) who all work together to provide you with the care you need, when you need it.  Important Information About Sugar             6 SALTY THINGS TO AVOID     1,'800MG'$  DAILY

## 2022-05-21 ENCOUNTER — Ambulatory Visit (INDEPENDENT_AMBULATORY_CARE_PROVIDER_SITE_OTHER): Payer: Medicare Other | Admitting: Internal Medicine

## 2022-05-21 ENCOUNTER — Encounter: Payer: Self-pay | Admitting: Internal Medicine

## 2022-05-21 VITALS — BP 110/70 | HR 66 | Temp 97.9°F | Ht 66.0 in | Wt 238.0 lb

## 2022-05-21 DIAGNOSIS — I200 Unstable angina: Secondary | ICD-10-CM | POA: Diagnosis not present

## 2022-05-21 DIAGNOSIS — Z6835 Body mass index (BMI) 35.0-35.9, adult: Secondary | ICD-10-CM | POA: Diagnosis not present

## 2022-05-21 DIAGNOSIS — R0789 Other chest pain: Secondary | ICD-10-CM

## 2022-05-21 DIAGNOSIS — Q441 Other congenital malformations of gallbladder: Secondary | ICD-10-CM | POA: Diagnosis not present

## 2022-05-21 DIAGNOSIS — R739 Hyperglycemia, unspecified: Secondary | ICD-10-CM

## 2022-05-21 DIAGNOSIS — I480 Paroxysmal atrial fibrillation: Secondary | ICD-10-CM

## 2022-05-21 DIAGNOSIS — I5032 Chronic diastolic (congestive) heart failure: Secondary | ICD-10-CM

## 2022-05-21 DIAGNOSIS — I10 Essential (primary) hypertension: Secondary | ICD-10-CM | POA: Diagnosis not present

## 2022-05-21 DIAGNOSIS — M5416 Radiculopathy, lumbar region: Secondary | ICD-10-CM | POA: Diagnosis not present

## 2022-05-21 MED ORDER — HYOSCYAMINE SULFATE 0.125 MG SL SUBL: 0.1250 mg | SUBLINGUAL_TABLET | SUBLINGUAL | 1 refills | Status: DC | PRN

## 2022-05-21 NOTE — Progress Notes (Signed)
Subjective:  Patient ID: Cody Frazier, male    DOB: Nov 29, 1945  Age: 76 y.o. MRN: [id number]  CC: No chief complaint on file.   HPI Cody Frazier presents for LBP, CP, PAF.  Pt had CP last week (05/13/22): he went to ER by ambulance.  Per hx:  " Admit date: 05/12/2022 Discharge date: 05/13/2022   Admitted From: Home Discharge disposition: Home   Recommendations at discharge:  Follow-up with neurosurgery as an outpatient  Follow-up with general surgery as an outpatient   Brief narrative: Cody Frazier is a 76 y.o. male with PMH significant for obesity, OSA, HTN, HLD, chronic diastolic CHF, A-fib/flutter on Eliquis, GERD, Barrett's esophagus, peptic ulcer disease, diverticulosis, chronic anemia, chronic back pain on opioids Patient presented to the ED on 7/1 with complaint of new onset acute chest pain radiating to left shoulder and back for about 2 hours prior to presentation.  Associated shortness of breath.  EMS gave sublingual nitroglycerin in route without improvement in chest pain.  Chest pain improved after IV fentanyl was given.   In the ED, patient was afebrile, heart rate in 70s, blood pressure elevated to 186/100 Troponin unremarkable. EKG showed sinus rhythm without evidence of T wave or ST changes, including no evidence of ST elevation.  Chest x-ray unremarkable CTA chest abdomen pelvis with dissection protocol rule out aortic aneurysm, dissection, acute PE.  It showed moderate gallbladder distention without cholelithiasis or signs of cholecystitis or CBD dilatation.     Subjective: Patient was seen and examined on the morning of 7/2.  Lying in bed.  Not in distress.  Left shoulder pain resolving.  Also pointed to midsternal/epigastric tenderness. Chart reviewed.  Blood pressure elevated to 180s overnight Labs unremarkable   Assessment and plan: Atypical chest pain/discomfort -Patient with new onset substernal chest discomfort radiating to left shoulder and back  lasting for 2 hours prior to presentation.   -Negative troponin.  EKG without significant ST-T wave changes. -Pain did not improve with nitroglycerin or aspirin but improved with IV fentanyl. -Echocardiogram did not show any wall motion abnormality.  Noncardiac etiology of pain.  Probably related to significant history of GERD/Barrett's esophagus.   GERD/Barrett's esophagus/history of peptic ulcer disease -On scheduled Pepcid as well as Protonix at home. -Continue same.  Also add Tums as needed. -No evidence of bleeding.  Hemoglobin stable. Recent Labs (within last 365 days)        Recent Labs    07/13/21 1556 11/20/21 1426 05/12/22 2345 05/13/22 0313  HGB 13.1 13.4 14.1 13.8  MCV 95.1 93 95.5 94.6        Distended gallbladder -CT abdomen showed moderate gallbladder distention without cholelithiasis or signs of cholecystitis or CBD dilatation.   -Liver enzymes normal. -Discussed with general surgery Dr. Bobbye Morton.  Recommended right upper quadrant ultrasound.  It showed the same finding as well.  No need of inpatient intervention for at this time.  Follow-up with general surgery as an outpatient. Last Labs       Recent Labs  Lab 05/12/22 2345 05/13/22 0313  AST 17 18  ALT 15 15  ALKPHOS 86 76  BILITOT 0.8 0.9  PROT 6.8 6.4*  ALBUMIN 3.7 3.5      Chronic back pain on opioids -Patient states that he has history of lumbar surgery in the past and needs to follow-up with a surgeon at [facility].  The surgeon apparently has retired.  I put in a referral for new appointment with  them as an outpatient. -For acute pain, I wrote a prescription for 20 tablets of Percocet at patient's request.   Chronic diastolic CHF Essential hypertension -PTA on Toprol 25 mg daily, telmisartan 75 mg daily, Aldactone 25 mg daily, Lasix 40 mg daily as needed -Blood pressure elevated to 180s overnight. -Continue all. -Echo with preserved EF   Paroxysmal A-fib -Continue  Toprol -CHADSVASC score of 5.  Continue Eliquis   Hyperlipidemia -Statin"  Outpatient Medications Prior to Visit  Medication Sig Dispense Refill  . acetaminophen (TYLENOL) 500 MG tablet Take 1,000 mg by mouth every 6 (six) hours as needed for mild pain or moderate pain. Maximum daily dose of 3g    . atorvastatin (LIPITOR) 20 MG tablet TAKE 1 TABLET BY MOUTH EVERY DAY 90 tablet 3  . cetirizine (ZYRTEC) 10 MG tablet Take 10 mg by mouth daily.    . Cholecalciferol (VITAMIN D3) 1.25 MG (50000 UT) CAPS TAKE 1 CAPSULE BY MOUTH EVERY 30 DAYS. 3 capsule 3  . ELIQUIS 2.5 MG TABS tablet TAKE 1 TABLET BY MOUTH TWICE A DAY (Patient taking differently: Take 2.5 mg by mouth 2 (two) times daily.) 180 tablet 3  . famotidine (PEPCID) 40 MG tablet Take 1 tablet (40 mg total) by mouth at bedtime. 30 tablet 11  . fluticasone (FLONASE) 50 MCG/ACT nasal spray SPRAY 2 SPRAYS INTO EACH NOSTRIL EVERY DAY 48 mL 3  . furosemide (LASIX) 40 MG tablet Take 1 tablet (40 mg total) by mouth daily as needed for edema. TAKE '40MG'$  DAILY x3 DAYS THEN TAKE '40MG'$  EVERY OTHER DAY THEN BACK TO PRN 45 tablet 12  . latanoprost (XALATAN) 0.005 % ophthalmic solution Place 1 drop into both eyes at bedtime.  11  . metoprolol succinate (TOPROL-XL) 25 MG 24 hr tablet TAKE 1 TABLET BY MOUTH EVERY DAY (Patient taking differently: Take 25 mg by mouth daily.) 90 tablet 3  . oxyCODONE (ROXICODONE) 15 MG immediate release tablet Take 15 mg by mouth every 4 (four) hours as needed for pain.    . pantoprazole (PROTONIX) 40 MG tablet Take 1 tablet (40 mg total) by mouth 2 (two) times daily. 60 tablet 11  . spironolactone (ALDACTONE) 25 MG tablet TAKE 1 TABLET BY MOUTH EVERY DAY (Patient taking differently: Take 25 mg by mouth daily.) 90 tablet 3  . telmisartan (MICARDIS) 20 MG tablet TAKE 1 TABLET BY MOUTH EVERY DAY 90 tablet 3  . timolol (TIMOPTIC) 0.25 % ophthalmic solution Place 1 drop into both eyes 2 (two) times daily.     Marland Kitchen triamcinolone cream  (KENALOG) 0.1 % Apply 1 application topically 3 (three) times daily. 80 g 3  . oxyCODONE (OXY IR/ROXICODONE) 5 MG immediate release tablet Take 1 tablet (5 mg total) by mouth every 6 (six) hours as needed for up to 5 days for severe pain. 20 tablet 0   No facility-administered medications prior to visit.    ROS: Review of Systems  Constitutional:  Positive for fatigue and unexpected weight change. Negative for appetite change.  HENT:  Negative for congestion, nosebleeds, sneezing, sore throat and trouble swallowing.   Eyes:  Negative for itching and visual disturbance.  Respiratory:  Negative for cough.   Cardiovascular:  Positive for chest pain. Negative for palpitations and leg swelling.  Gastrointestinal:  Positive for abdominal pain. Negative for abdominal distention, blood in stool, diarrhea and nausea.  Genitourinary:  Negative for frequency and hematuria.  Musculoskeletal:  Positive for arthralgias, back pain and gait problem. Negative for joint  swelling and neck pain.  Skin:  Negative for rash.  Neurological:  Negative for dizziness, tremors, speech difficulty and weakness.  Psychiatric/Behavioral:  Negative for agitation, decreased concentration, dysphoric mood and sleep disturbance. The patient is not nervous/anxious.     Objective:  BP 110/70 (BP Location: Left Arm, Patient Position: Sitting, Cuff Size: Large)   Pulse 66   Temp 97.9 F (36.6 C) (Oral)   Ht '5\' 6"'$  (1.676 m)   Wt 238 lb (108 kg)   SpO2 99%   BMI 38.41 kg/m   BP Readings from Last 3 Encounters:  05/21/22 110/70  05/18/22 124/76  05/13/22 (!) 146/88    Wt Readings from Last 3 Encounters:  05/21/22 238 lb (108 kg)  05/18/22 240 lb 6.4 oz (109 kg)  05/12/22 240 lb (108.9 kg)    Physical Exam Constitutional:      General: He is not in acute distress.    Appearance: He is well-developed. He is obese.     Comments: NAD  Eyes:     Conjunctiva/sclera: Conjunctivae normal.     Pupils: Pupils are  equal, round, and reactive to light.  Neck:     Thyroid: No thyromegaly.     Vascular: No JVD.  Cardiovascular:     Rate and Rhythm: Normal rate and regular rhythm.     Heart sounds: Normal heart sounds. No murmur heard.    No friction rub. No gallop.  Pulmonary:     Effort: Pulmonary effort is normal. No respiratory distress.     Breath sounds: Normal breath sounds. No wheezing or rales.  Chest:     Chest wall: No tenderness.  Abdominal:     General: Bowel sounds are normal. There is no distension.     Palpations: Abdomen is soft. There is no mass.     Tenderness: There is no abdominal tenderness. There is no guarding or rebound.  Musculoskeletal:        General: No tenderness. Normal range of motion.     Cervical back: Normal range of motion.  Lymphadenopathy:     Cervical: No cervical adenopathy.  Skin:    General: Skin is warm and dry.     Findings: No rash.  Neurological:     Mental Status: He is alert and oriented to person, place, and time.     Cranial Nerves: No cranial nerve deficit.     Motor: No abnormal muscle tone.     Coordination: Coordination abnormal.     Gait: Gait abnormal.     Deep Tendon Reflexes: Reflexes are normal and symmetric.  Psychiatric:        Behavior: Behavior normal.        Thought Content: Thought content normal.        Judgment: Judgment normal.  Using a cane LS w/pain    A total time of 45 minutes was spent preparing to see the patient, reviewing tests, x-rays, operative reports and other medical records.  Also, obtaining history and performing comprehensive physical exam.  Additionally, counseling the patient regarding the above listed issues.   Finally, documenting clinical information in the health records, coordination of care, educating the patient. It is a complex case.   Lab Results  Component Value Date   WBC 9.1 05/13/2022   HGB 13.8 05/13/2022   HCT 40.4 05/13/2022   PLT 291 05/13/2022   GLUCOSE 120 (H) 05/13/2022   CHOL  196 11/20/2018   TRIG 269.0 (H) 11/20/2018   HDL 43.60 11/20/2018  LDLDIRECT 109.0 11/20/2018   LDLCALC 86 11/25/2017   ALT 15 05/13/2022   AST 18 05/13/2022   NA 142 05/13/2022   K 4.2 05/13/2022   CL 108 05/13/2022   CREATININE 1.07 05/13/2022   BUN 11 05/13/2022   CO2 24 05/13/2022   TSH 0.96 07/13/2021   PSA 0.16 11/20/2018   INR 1.1 08/12/2020   HGBA1C 5.7 07/13/2021    US Abdomen Limited RUQ (LIVER/GB)  Result Date: 05/13/2022 CLINICAL DATA:  Follow-up from abnormal CT scan. EXAM: ULTRASOUND ABDOMEN LIMITED RIGHT UPPER QUADRANT COMPARISON:  CTA chest, abdomen and pelvis, 05/13/2022 at 4:37 a.m. FINDINGS: Gallbladder: Significantly distended.  No wall thickening.  No stone. Common bile duct: Diameter: 6 mm Liver: Limited visualization. No abnormality. Portal vein is patent on color Doppler imaging with normal direction of blood flow towards the liver. Other: None. IMPRESSION: 1. Limited exam due to bowel gas. 2. Distended gallbladder, but no wall thickening or pericholecystic fluid or stones. No sonographic evidence of acute cholecystitis. 3. No bile duct dilation. Electronically Signed   By: Lajean Manes M.D.   On: 05/13/2022 15:03   ECHOCARDIOGRAM COMPLETE  Result Date: 05/13/2022    ECHOCARDIOGRAM REPORT   Patient Name:   Cody Frazier Date of Exam: 05/13/2022 Medical Rec #:  [id number]       Height:       66.0 in Accession #:    [id number]      Weight:       240.0 lb Date of Birth:  [birth year]      BSA:          2.161 m Patient Age:    56 years        BP:           150/88 mmHg Patient Gender: M               HR:           77 bpm. Exam Location:  Inpatient Procedure: 2D Echo, Color Doppler, Cardiac Doppler and Intracardiac            Opacification Agent Indications:    R07.9* Chest pain, unspecified  History:        Patient has prior history of Echocardiogram examinations, most                 recent 11/22/2021. CHF, Arrythmias:Atrial Fibrillation; Risk                  Factors:Hypertension, Dyslipidemia and Sleep Apnea.  Sonographer:    Raquel Sarna Senior RDCS Referring Phys: [id number] Rhetta Mura  Sonographer Comments: Technically difficult due to poor echo windows. IMPRESSIONS  1. Left ventricular ejection fraction, by estimation, is 60 to 65%. The left ventricle has normal function. The left ventricle has no regional wall motion abnormalities. Left ventricular diastolic parameters are consistent with Grade I diastolic dysfunction (impaired relaxation).  2. Right ventricular systolic function was not well visualized. The right ventricular size is not well visualized.  3. The mitral valve is normal in structure. No evidence of mitral valve regurgitation. No evidence of mitral stenosis.  4. The aortic valve is tricuspid. Aortic valve regurgitation is not visualized. Aortic valve sclerosis/calcification is present, without any evidence of aortic stenosis. FINDINGS  Left Ventricle: Left ventricular ejection fraction, by estimation, is 60 to 65%. The left ventricle has normal function. The left ventricle has no regional wall motion abnormalities. Definity contrast agent was given IV to delineate the left ventricular  endocardial borders. The left ventricular internal cavity size was normal in size. There is no left ventricular hypertrophy. Left ventricular diastolic parameters are consistent with Grade I diastolic dysfunction (impaired relaxation). Right Ventricle: The right ventricular size is not well visualized. Right ventricular systolic function was not well visualized. Left Atrium: Left atrial size was normal in size. Right Atrium: Right atrial size was normal in size. Pericardium: There is no evidence of pericardial effusion. Mitral Valve: The mitral valve is normal in structure. No evidence of mitral valve regurgitation. No evidence of mitral valve stenosis. Tricuspid Valve: The tricuspid valve is normal in structure. Tricuspid valve regurgitation is not demonstrated. No  evidence of tricuspid stenosis. Aortic Valve: The aortic valve is tricuspid. Aortic valve regurgitation is not visualized. Aortic valve sclerosis/calcification is present, without any evidence of aortic stenosis. Pulmonic Valve: The pulmonic valve was normal in structure. Pulmonic valve regurgitation is not visualized. No evidence of pulmonic stenosis. Aorta: The aortic root is normal in size and structure. Venous: The inferior vena cava was not well visualized. IAS/Shunts: The interatrial septum was not well visualized.  LEFT VENTRICLE PLAX 2D LVIDd:         3.30 cm   Diastology LVIDs:         2.20 cm   LV e' medial:    3.92 cm/s LV PW:         1.00 cm   LV E/e' medial:  13.2 LV IVS:        1.10 cm   LV e' lateral:   8.16 cm/s LVOT diam:     2.30 cm   LV E/e' lateral: 6.4 LV SV:         98 LV SV Index:   45 LVOT Area:     4.15 cm  RIGHT VENTRICLE RV S prime:     12.00 cm/s TAPSE (M-mode): 2.5 cm LEFT ATRIUM             Index        RIGHT ATRIUM           Index LA diam:        3.60 cm 1.67 cm/m   RA Area:     11.60 cm LA Vol (A2C):   60.5 ml 28.00 ml/m  RA Volume:   23.30 ml  10.78 ml/m LA Vol (A4C):   48.8 ml 22.58 ml/m LA Biplane Vol: 55.5 ml 25.68 ml/m  AORTIC VALVE LVOT Vmax:   114.00 cm/s LVOT Vmean:  89.300 cm/s LVOT VTI:    0.235 m  AORTA Ao Root diam: 3.70 cm Ao Asc diam:  3.60 cm MITRAL VALVE MV Area (PHT): 2.90 cm    SHUNTS MV Decel Time: 262 msec    Systemic VTI:  0.24 m MV E velocity: 51.90 cm/s  Systemic Diam: 2.30 cm MV A velocity: 86.50 cm/s MV E/A ratio:  0.60 Kirk Ruths MD Electronically signed by Kirk Ruths MD Signature Date/Time: 05/13/2022/2:11:53 PM    Final    CT Angio Chest/Abd/Pel for Dissection W and/or Wo Contrast  Result Date: 05/13/2022 CLINICAL DATA:  Chest pain with nausea and shortness of breath. EXAM: CT ANGIOGRAPHY CHEST, ABDOMEN AND PELVIS TECHNIQUE: Non-contrast CT of the chest was initially obtained. Multidetector CT imaging through the chest, abdomen and pelvis  was performed using the standard protocol during bolus administration of intravenous contrast. Multiplanar reconstructed images and MIPs were obtained and reviewed to evaluate the vascular anatomy. RADIATION DOSE REDUCTION: This exam was performed according to the departmental  dose-optimization program which includes automated exposure control, adjustment of the mA and/or kV according to patient size and/or use of iterative reconstruction technique. CONTRAST:  133m OMNIPAQUE IOHEXOL 350 MG/ML SOLN COMPARISON:  August 13, 2020 FINDINGS: CTA CHEST FINDINGS Cardiovascular: There is mild calcification of the aortic arch, without evidence of aortic aneurysm or dissection. There is limited evaluation of the subsegmental pulmonary arteries secondary to areas of overlying artifact. This is most prominent within the bilateral lung bases (axial CT images 67 through 74, CT series 6). No evidence of pulmonary embolism. Normal heart size with mild coronary artery calcification. No pericardial effusion. Mediastinum/Nodes: No enlarged mediastinal, hilar, or axillary lymph nodes. Thyroid gland, trachea, and esophagus demonstrate no significant findings. Lungs/Pleura: Mild atelectasis is seen within the right lower lobe. There is no evidence of acute infiltrate, pleural effusion or pneumothorax. Musculoskeletal: Multilevel degenerative changes are seen throughout the thoracic spine. Review of the MIP images confirms the above findings. CTA ABDOMEN AND PELVIS FINDINGS VASCULAR Aorta: Moderate to marked severity calcification of a normal caliber aorta without aneurysm, dissection, vasculitis or significant stenosis. Celiac: The celiac artery is duplicated, without evidence of aneurysm, dissection, vasculitis or significant stenosis. SMA: Patent without evidence of aneurysm, dissection, vasculitis or significant stenosis. Renals: Both renal arteries are patent without evidence of aneurysm, dissection, vasculitis, fibromuscular  dysplasia or significant stenosis. IMA: Patent without evidence of aneurysm, dissection, vasculitis or significant stenosis. Inflow: Marked severity calcification without evidence of aneurysm, dissection, vasculitis or significant stenosis. Veins: No obvious venous abnormality within the limitations of this arterial phase study. Review of the MIP images confirms the above findings. NON-VASCULAR Hepatobiliary: No focal liver abnormality is seen. There is moderate to marked severity gallbladder distension, without evidence of gallstones or gallbladder wall thickening. The common bile duct is dilated and measures 12.4 mm in diameter. Pancreas: A mild amount of peripancreatic inflammatory fat stranding is seen along the anterior aspect of the junction of the pancreatic body and head. Spleen: Normal in size without focal abnormality. Adrenals/Urinary Tract: Adrenal glands are unremarkable. Kidneys are normal in size, without obstructing renal calculi or hydronephrosis. 2.3 cm and 4.2 cm simple cysts are seen within the posterior aspect of the mid right kidney. A 1.1 cm simple cyst is seen within the anterolateral aspect of the mid to lower left kidney. No additional follow-up or imaging is recommended. Bladder is unremarkable. Stomach/Bowel: Stomach is within normal limits. The appendix is surgically absent. No evidence of bowel wall thickening, distention, or inflammatory changes. Noninflamed diverticula are seen throughout the large bowel. Lymphatic: No abnormal abdominal or pelvic lymph nodes are identified. Reproductive: Prostate is unremarkable. Other: No abdominal wall hernia or abnormality. No abdominopelvic ascites. Musculoskeletal: Extensive postoperative changes are seen throughout the lumbar spine. Review of the MIP images confirms the above findings. IMPRESSION: 1. No evidence of pulmonary embolism. 2. Mild right lower lobe atelectasis. 3. Mild acute pancreatitis. Correlation with pancreatic enzymes is  recommended. 4. Moderate to marked severity gallbladder distension without evidence of gallstones or gallbladder wall thickening. 5. Colonic diverticulosis. Aortic Atherosclerosis (ICD10-I70.0). Electronically Signed   By: TVirgina NorfolkM.D.   On: 05/13/2022 02:06   DG Chest 2 View  Result Date: 05/13/2022 CLINICAL DATA:  Chest pain EXAM: CHEST - 2 VIEW COMPARISON:  08/13/2020 FINDINGS: Cardiac shadow is stable. Aortic calcifications are seen. The lungs are well aerated without focal infiltrate or effusion. Chronic compression deformities are noted in the lower thoracic spine. Postsurgical changes in the upper lumbar spine are noted as  well. IMPRESSION: No acute abnormality noted. Electronically Signed   By: Inez Catalina M.D.   On: 05/13/2022 00:40    Assessment & Plan:   Problem List Items Addressed This Visit     Chest pain, atypical    05/2022 Chest CT IMPRESSION: 1. No evidence of pulmonary embolism. 2. Mild right lower lobe atelectasis. 3. Mild acute pancreatitis. Correlation with pancreatic enzymes is recommended. 4. Moderate to marked severity gallbladder distension without evidence of gallstones or gallbladder wall thickening. 5. Colonic diverticulosis.    GI ref - Dr Fuller Plan      Chronic diastolic CHF (congestive heart failure) ([place])    Compensated      Essential hypertension            Hyperglycemia    Pre-DM Options discussed      Obesity    Discussed again the need for wt loss      Paroxysmal atrial fibrillation (HCC)    On Eliquis         No orders of the defined types were placed in this encounter.     Follow-up: No follow-ups on file.  Walker Kehr, MD

## 2022-05-21 NOTE — Assessment & Plan Note (Signed)
Compensated 

## 2022-05-21 NOTE — Assessment & Plan Note (Signed)
Pre-DM Options discussed

## 2022-05-21 NOTE — Assessment & Plan Note (Signed)
On Eliquis

## 2022-05-21 NOTE — Assessment & Plan Note (Addendum)
05/2022 Chest CT IMPRESSION: 1. No evidence of pulmonary embolism. 2. Mild right lower lobe atelectasis. 3. Mild acute pancreatitis. Correlation with pancreatic enzymes is recommended. 4. Moderate to marked severity gallbladder distension without evidence of gallstones or gallbladder wall thickening. 5. Colonic diverticulosis.   GI ref - Dr Fuller Plan Hyoscyamine SL prn

## 2022-05-21 NOTE — Assessment & Plan Note (Signed)
Discussed again the need for wt loss

## 2022-05-22 ENCOUNTER — Other Ambulatory Visit: Payer: Self-pay | Admitting: Neurosurgery

## 2022-05-22 ENCOUNTER — Ambulatory Visit: Payer: Medicare Other

## 2022-05-22 DIAGNOSIS — M5416 Radiculopathy, lumbar region: Secondary | ICD-10-CM

## 2022-05-23 DIAGNOSIS — M6281 Muscle weakness (generalized): Secondary | ICD-10-CM | POA: Diagnosis not present

## 2022-05-23 DIAGNOSIS — R293 Abnormal posture: Secondary | ICD-10-CM | POA: Diagnosis not present

## 2022-05-23 DIAGNOSIS — M2569 Stiffness of other specified joint, not elsewhere classified: Secondary | ICD-10-CM | POA: Diagnosis not present

## 2022-05-23 DIAGNOSIS — M5416 Radiculopathy, lumbar region: Secondary | ICD-10-CM | POA: Diagnosis not present

## 2022-05-25 DIAGNOSIS — M2569 Stiffness of other specified joint, not elsewhere classified: Secondary | ICD-10-CM | POA: Diagnosis not present

## 2022-05-25 DIAGNOSIS — M6281 Muscle weakness (generalized): Secondary | ICD-10-CM | POA: Diagnosis not present

## 2022-05-25 DIAGNOSIS — R293 Abnormal posture: Secondary | ICD-10-CM | POA: Diagnosis not present

## 2022-05-25 DIAGNOSIS — M5416 Radiculopathy, lumbar region: Secondary | ICD-10-CM | POA: Diagnosis not present

## 2022-05-29 DIAGNOSIS — R293 Abnormal posture: Secondary | ICD-10-CM | POA: Diagnosis not present

## 2022-05-29 DIAGNOSIS — M2569 Stiffness of other specified joint, not elsewhere classified: Secondary | ICD-10-CM | POA: Diagnosis not present

## 2022-05-29 DIAGNOSIS — M5416 Radiculopathy, lumbar region: Secondary | ICD-10-CM | POA: Diagnosis not present

## 2022-05-29 DIAGNOSIS — M6281 Muscle weakness (generalized): Secondary | ICD-10-CM | POA: Diagnosis not present

## 2022-06-01 DIAGNOSIS — M6281 Muscle weakness (generalized): Secondary | ICD-10-CM | POA: Diagnosis not present

## 2022-06-01 DIAGNOSIS — M2569 Stiffness of other specified joint, not elsewhere classified: Secondary | ICD-10-CM | POA: Diagnosis not present

## 2022-06-01 DIAGNOSIS — R293 Abnormal posture: Secondary | ICD-10-CM | POA: Diagnosis not present

## 2022-06-01 DIAGNOSIS — M5416 Radiculopathy, lumbar region: Secondary | ICD-10-CM | POA: Diagnosis not present

## 2022-06-02 ENCOUNTER — Ambulatory Visit
Admission: RE | Admit: 2022-06-02 | Discharge: 2022-06-02 | Disposition: A | Discharge status: Home / Self Care | Payer: Medicare Other | Source: Ambulatory Visit | Attending: Neurosurgery | Admitting: Neurosurgery

## 2022-06-02 DIAGNOSIS — M48061 Spinal stenosis, lumbar region without neurogenic claudication: Secondary | ICD-10-CM | POA: Diagnosis not present

## 2022-06-02 DIAGNOSIS — M549 Dorsalgia, unspecified: Secondary | ICD-10-CM | POA: Diagnosis not present

## 2022-06-02 DIAGNOSIS — M5416 Radiculopathy, lumbar region: Secondary | ICD-10-CM | POA: Diagnosis not present

## 2022-06-02 DIAGNOSIS — M545 Low back pain, unspecified: Secondary | ICD-10-CM | POA: Diagnosis not present

## 2022-06-02 DIAGNOSIS — M4126 Other idiopathic scoliosis, lumbar region: Secondary | ICD-10-CM | POA: Diagnosis not present

## 2022-06-02 DIAGNOSIS — M40204 Unspecified kyphosis, thoracic region: Secondary | ICD-10-CM | POA: Diagnosis not present

## 2022-06-02 DIAGNOSIS — M4807 Spinal stenosis, lumbosacral region: Secondary | ICD-10-CM | POA: Diagnosis not present

## 2022-06-06 DIAGNOSIS — M6281 Muscle weakness (generalized): Secondary | ICD-10-CM | POA: Diagnosis not present

## 2022-06-06 DIAGNOSIS — M5416 Radiculopathy, lumbar region: Secondary | ICD-10-CM | POA: Diagnosis not present

## 2022-06-06 DIAGNOSIS — M2569 Stiffness of other specified joint, not elsewhere classified: Secondary | ICD-10-CM | POA: Diagnosis not present

## 2022-06-06 DIAGNOSIS — R293 Abnormal posture: Secondary | ICD-10-CM | POA: Diagnosis not present

## 2022-06-08 DIAGNOSIS — M2569 Stiffness of other specified joint, not elsewhere classified: Secondary | ICD-10-CM | POA: Diagnosis not present

## 2022-06-08 DIAGNOSIS — M5416 Radiculopathy, lumbar region: Secondary | ICD-10-CM | POA: Diagnosis not present

## 2022-06-08 DIAGNOSIS — R293 Abnormal posture: Secondary | ICD-10-CM | POA: Diagnosis not present

## 2022-06-08 DIAGNOSIS — M6281 Muscle weakness (generalized): Secondary | ICD-10-CM | POA: Diagnosis not present

## 2022-06-11 DIAGNOSIS — M5416 Radiculopathy, lumbar region: Secondary | ICD-10-CM | POA: Diagnosis not present

## 2022-06-13 DIAGNOSIS — M5416 Radiculopathy, lumbar region: Secondary | ICD-10-CM | POA: Diagnosis not present

## 2022-06-13 DIAGNOSIS — M6281 Muscle weakness (generalized): Secondary | ICD-10-CM | POA: Diagnosis not present

## 2022-06-13 DIAGNOSIS — M2569 Stiffness of other specified joint, not elsewhere classified: Secondary | ICD-10-CM | POA: Diagnosis not present

## 2022-06-13 DIAGNOSIS — R293 Abnormal posture: Secondary | ICD-10-CM | POA: Diagnosis not present

## 2022-06-20 DIAGNOSIS — M2569 Stiffness of other specified joint, not elsewhere classified: Secondary | ICD-10-CM | POA: Diagnosis not present

## 2022-06-20 DIAGNOSIS — R293 Abnormal posture: Secondary | ICD-10-CM | POA: Diagnosis not present

## 2022-06-20 DIAGNOSIS — M6281 Muscle weakness (generalized): Secondary | ICD-10-CM | POA: Diagnosis not present

## 2022-06-20 DIAGNOSIS — M5416 Radiculopathy, lumbar region: Secondary | ICD-10-CM | POA: Diagnosis not present

## 2022-06-22 DIAGNOSIS — M2569 Stiffness of other specified joint, not elsewhere classified: Secondary | ICD-10-CM | POA: Diagnosis not present

## 2022-06-22 DIAGNOSIS — M5416 Radiculopathy, lumbar region: Secondary | ICD-10-CM | POA: Diagnosis not present

## 2022-06-22 DIAGNOSIS — R293 Abnormal posture: Secondary | ICD-10-CM | POA: Diagnosis not present

## 2022-06-22 DIAGNOSIS — M6281 Muscle weakness (generalized): Secondary | ICD-10-CM | POA: Diagnosis not present

## 2022-06-27 DIAGNOSIS — M2569 Stiffness of other specified joint, not elsewhere classified: Secondary | ICD-10-CM | POA: Diagnosis not present

## 2022-06-27 DIAGNOSIS — M6281 Muscle weakness (generalized): Secondary | ICD-10-CM | POA: Diagnosis not present

## 2022-06-27 DIAGNOSIS — R293 Abnormal posture: Secondary | ICD-10-CM | POA: Diagnosis not present

## 2022-06-27 DIAGNOSIS — M5416 Radiculopathy, lumbar region: Secondary | ICD-10-CM | POA: Diagnosis not present

## 2022-06-29 DIAGNOSIS — M2569 Stiffness of other specified joint, not elsewhere classified: Secondary | ICD-10-CM | POA: Diagnosis not present

## 2022-06-29 DIAGNOSIS — M6281 Muscle weakness (generalized): Secondary | ICD-10-CM | POA: Diagnosis not present

## 2022-06-29 DIAGNOSIS — M5416 Radiculopathy, lumbar region: Secondary | ICD-10-CM | POA: Diagnosis not present

## 2022-06-29 DIAGNOSIS — R293 Abnormal posture: Secondary | ICD-10-CM | POA: Diagnosis not present

## 2022-07-06 DIAGNOSIS — R293 Abnormal posture: Secondary | ICD-10-CM | POA: Diagnosis not present

## 2022-07-06 DIAGNOSIS — M2569 Stiffness of other specified joint, not elsewhere classified: Secondary | ICD-10-CM | POA: Diagnosis not present

## 2022-07-06 DIAGNOSIS — M5416 Radiculopathy, lumbar region: Secondary | ICD-10-CM | POA: Diagnosis not present

## 2022-07-06 DIAGNOSIS — M6281 Muscle weakness (generalized): Secondary | ICD-10-CM | POA: Diagnosis not present

## 2022-07-10 ENCOUNTER — Encounter: Payer: Self-pay | Admitting: Internal Medicine

## 2022-07-10 ENCOUNTER — Ambulatory Visit (INDEPENDENT_AMBULATORY_CARE_PROVIDER_SITE_OTHER): Payer: Medicare Other | Admitting: Internal Medicine

## 2022-07-10 DIAGNOSIS — M519 Unspecified thoracic, thoracolumbar and lumbosacral intervertebral disc disorder: Secondary | ICD-10-CM | POA: Diagnosis not present

## 2022-07-10 DIAGNOSIS — Z6835 Body mass index (BMI) 35.0-35.9, adult: Secondary | ICD-10-CM

## 2022-07-10 DIAGNOSIS — E1169 Type 2 diabetes mellitus with other specified complication: Secondary | ICD-10-CM | POA: Diagnosis not present

## 2022-07-10 DIAGNOSIS — E669 Obesity, unspecified: Secondary | ICD-10-CM | POA: Insufficient documentation

## 2022-07-10 DIAGNOSIS — I200 Unstable angina: Secondary | ICD-10-CM

## 2022-07-10 DIAGNOSIS — I480 Paroxysmal atrial fibrillation: Secondary | ICD-10-CM

## 2022-07-10 DIAGNOSIS — I10 Essential (primary) hypertension: Secondary | ICD-10-CM | POA: Diagnosis not present

## 2022-07-10 MED ORDER — RYBELSUS 3 MG PO TABS: 3.0000 mg | ORAL_TABLET | Freq: Every day | ORAL | 3 refills | Status: DC

## 2022-07-10 MED ORDER — OXYCODONE HCL 15 MG PO TABS: 15.0000 mg | ORAL_TABLET | ORAL | 0 refills | Status: DC | PRN

## 2022-07-10 NOTE — Assessment & Plan Note (Signed)
  Cont on Telmisartan, Lasix 

## 2022-07-10 NOTE — Assessment & Plan Note (Signed)
Cont on anticoagulation - Eliquis 

## 2022-07-10 NOTE — Assessment & Plan Note (Signed)
New Start Rybelsus

## 2022-07-10 NOTE — Assessment & Plan Note (Addendum)
Chronic Continue with oxycodone 15 mg 4 times a day as needed  Potential benefits of a long term opioids use as well as potential risks (i.e. addiction risk, apnea etc) and complications (i.e. Somnolence, constipation and others) were explained to the patient and were aknowledged. A complex case We talked about his latest X rays, NS visit w/Dr Marcello Moores Pain is worse w/PT

## 2022-07-10 NOTE — Assessment & Plan Note (Addendum)
Refractory Diet discussed

## 2022-07-10 NOTE — Progress Notes (Signed)
Subjective:  Patient ID: Cody Frazier, male    DOB: Mar 15, 1946  Age: 76 y.o. MRN: [id number]  CC: Follow-up (3 month f/u)   HPI Cody Frazier presents for LBP, chronic pain, HTN, DM.  H/o back surgeries with Dr. Joya Salm in 2004 and 2009, most recently L2-S1 fusion. 3 years ago, he fell at [facility] center, had a wrist fracture and broken nose. Since then, he has had difficulty walking, and has developed neuropathy in his right foot, later in his left foot. He sleeps only on his right side. He has pain in his lower back, lumbar region, going down back of his legs.  Outpatient Medications Prior to Visit  Medication Sig Dispense Refill   acetaminophen (TYLENOL) 500 MG tablet Take 1,000 mg by mouth every 6 (six) hours as needed for mild pain or moderate pain. Maximum daily dose of 3g     atorvastatin (LIPITOR) 20 MG tablet TAKE 1 TABLET BY MOUTH EVERY DAY 90 tablet 3   cetirizine (ZYRTEC) 10 MG tablet Take 10 mg by mouth daily.     Cholecalciferol (VITAMIN D3) 1.25 MG (50000 UT) CAPS TAKE 1 CAPSULE BY MOUTH EVERY 30 DAYS. 3 capsule 3   ELIQUIS 2.5 MG TABS tablet TAKE 1 TABLET BY MOUTH TWICE A DAY (Patient taking differently: Take 2.5 mg by mouth 2 (two) times daily.) 180 tablet 3   famotidine (PEPCID) 40 MG tablet Take 1 tablet (40 mg total) by mouth at bedtime. 30 tablet 11   fluticasone (FLONASE) 50 MCG/ACT nasal spray SPRAY 2 SPRAYS INTO EACH NOSTRIL EVERY DAY 48 mL 3   furosemide (LASIX) 40 MG tablet Take 1 tablet (40 mg total) by mouth daily as needed for edema. TAKE '40MG'$  DAILY x3 DAYS THEN TAKE '40MG'$  EVERY OTHER DAY THEN BACK TO PRN 45 tablet 12   hyoscyamine (LEVSIN SL) 0.125 MG SL tablet Place 1-2 tablets (0.125-0.25 mg total) under the tongue every 4 (four) hours as needed for cramping. 120 tablet 1   latanoprost (XALATAN) 0.005 % ophthalmic solution Place 1 drop into both eyes at bedtime.  11   metoprolol succinate (TOPROL-XL) 25 MG 24 hr tablet TAKE 1 TABLET BY MOUTH EVERY  DAY (Patient taking differently: Take 25 mg by mouth daily.) 90 tablet 3   pantoprazole (PROTONIX) 40 MG tablet Take 1 tablet (40 mg total) by mouth 2 (two) times daily. 60 tablet 11   spironolactone (ALDACTONE) 25 MG tablet TAKE 1 TABLET BY MOUTH EVERY DAY (Patient taking differently: Take 25 mg by mouth daily.) 90 tablet 3   telmisartan (MICARDIS) 20 MG tablet TAKE 1 TABLET BY MOUTH EVERY DAY 90 tablet 3   timolol (TIMOPTIC) 0.25 % ophthalmic solution Place 1 drop into both eyes 2 (two) times daily.      triamcinolone cream (KENALOG) 0.1 % Apply 1 application topically 3 (three) times daily. 80 g 3   oxyCODONE (ROXICODONE) 15 MG immediate release tablet Take 15 mg by mouth every 4 (four) hours as needed for pain.     No facility-administered medications prior to visit.    ROS: Review of Systems  Constitutional:  Positive for fatigue. Negative for appetite change and unexpected weight change.  HENT:  Negative for congestion, nosebleeds, sneezing, sore throat and trouble swallowing.   Eyes:  Negative for itching and visual disturbance.  Respiratory:  Negative for cough.   Cardiovascular:  Negative for chest pain, palpitations and leg swelling.  Gastrointestinal:  Negative for abdominal distention, blood in stool, diarrhea  and nausea.  Genitourinary:  Negative for frequency and hematuria.  Musculoskeletal:  Positive for back pain and gait problem. Negative for joint swelling and neck pain.  Skin:  Negative for rash.  Neurological:  Negative for dizziness, tremors, speech difficulty and weakness.  Hematological:  Does not bruise/bleed easily.  Psychiatric/Behavioral:  Negative for agitation, dysphoric mood, sleep disturbance and suicidal ideas. The patient is not nervous/anxious.     Objective:  BP 120/72 (BP Location: Left Arm)   Pulse 76   Temp 98.2 F (36.8 C) (Oral)   Ht '5\' 6"'$  (1.676 m)   Wt 238 lb 6.4 oz (108.1 kg)   SpO2 98%   BMI 38.48 kg/m   BP Readings from Last 3  Encounters:  07/10/22 120/72  05/21/22 110/70  05/18/22 124/76    Wt Readings from Last 3 Encounters:  07/10/22 238 lb 6.4 oz (108.1 kg)  05/21/22 238 lb (108 kg)  05/18/22 240 lb 6.4 oz (109 kg)    Physical Exam Constitutional:      General: He is not in acute distress.    Appearance: He is well-developed. He is obese.     Comments: NAD  Eyes:     Conjunctiva/sclera: Conjunctivae normal.     Pupils: Pupils are equal, round, and reactive to light.  Neck:     Thyroid: No thyromegaly.     Vascular: No JVD.  Cardiovascular:     Rate and Rhythm: Normal rate and regular rhythm.     Heart sounds: Normal heart sounds. No murmur heard.    No friction rub. No gallop.  Pulmonary:     Effort: Pulmonary effort is normal. No respiratory distress.     Breath sounds: Normal breath sounds. No wheezing or rales.  Chest:     Chest wall: No tenderness.  Abdominal:     General: Bowel sounds are normal. There is no distension.     Palpations: Abdomen is soft. There is no mass.     Tenderness: There is no abdominal tenderness. There is no guarding or rebound.  Musculoskeletal:        General: Tenderness present. Normal range of motion.     Cervical back: Normal range of motion.  Lymphadenopathy:     Cervical: No cervical adenopathy.  Skin:    General: Skin is warm and dry.     Findings: No rash.  Neurological:     Mental Status: He is alert and oriented to person, place, and time.     Cranial Nerves: No cranial nerve deficit.     Motor: No abnormal muscle tone.     Coordination: Coordination abnormal.     Gait: Gait abnormal.     Deep Tendon Reflexes: Reflexes are normal and symmetric.  Psychiatric:        Behavior: Behavior normal.        Thought Content: Thought content normal.        Judgment: Judgment normal.   Antalgic gait, using a cane  Lab Results  Component Value Date   WBC 9.1 05/13/2022   HGB 13.8 05/13/2022   HCT 40.4 05/13/2022   PLT 291 05/13/2022   GLUCOSE 120  (H) 05/13/2022   CHOL 196 11/20/2018   TRIG 269.0 (H) 11/20/2018   HDL 43.60 11/20/2018   LDLDIRECT 109.0 11/20/2018   LDLCALC 86 11/25/2017   ALT 15 05/13/2022   AST 18 05/13/2022   NA 142 05/13/2022   K 4.2 05/13/2022   CL 108 05/13/2022   CREATININE 1.07  05/13/2022   BUN 11 05/13/2022   CO2 24 05/13/2022   TSH 0.96 07/13/2021   PSA 0.16 11/20/2018   INR 1.1 08/12/2020   HGBA1C 5.7 07/13/2021    MR LUMBAR SPINE WO CONTRAST  Result Date: 06/04/2022 CLINICAL DATA:  Lumbar radiculopathy. Chronic mid and lower back pain with pain radiating into the shoulders. Prior lumbar surgery. EXAM: MRI LUMBAR SPINE WITHOUT CONTRAST TECHNIQUE: Multiplanar, multisequence MR imaging of the lumbar spine was performed. No intravenous contrast was administered. COMPARISON:  CTA chest, abdomen, and pelvis 05/13/2022. Lumbar myelogram 04/19/2008. FINDINGS: Segmentation:  Standard. Alignment: Thoracolumbar dextroscoliosis. Trace fused anterolisthesis of L5 on S1 and trace retrolisthesis of L1 on L2. Vertebrae: No fracture or suspicious marrow lesion. Prior L2-S1 posterior fusion. Interbody spacers at L2-3, L3-4, and L4-5 with interbody osseous fusion at these levels as well as at L1-2 and L5-S1. Prominent degenerative endplate edema at K27-06 as noted on today's separate thoracic spine MRI. Conus medullaris and cauda equina: Conus extends to the L1-2 level. Conus and cauda equina appear normal. Paraspinal and other soft tissues: Bilateral renal cysts. Postoperative changes in the posterior lumbar soft tissues. Fluid collection in the laminectomy bed from L3-S1 without mass effect on the thecal sac. Disc levels: Lower thoracic spine reported separately. T12-L1: Disc desiccation and severe disc space narrowing. Mild disc bulging and facet arthrosis without significant stenosis. L1-2: Prior posterior decompression. Retrolisthesis and endplate spurring without significant stenosis. L2-3: Prior posterior decompression and  fusion.  No stenosis. L3-4: Prior posterior decompression and fusion.  No stenosis. L4-5: Prior posterior decompression and fusion. Widely patent spinal canal and left neural foramen. The right L5 screw courses superior to the pedicle, traversing the inferior aspect of the right L4 neural foramen. L5-S1: Prior posterior decompression and fusion. Right eccentric disc bulging and endplate and facet spurring result in moderate right lateral recess and moderate right neural foraminal stenosis. No generalized spinal stenosis. Patent left neural foramen. IMPRESSION: 1. L2-S1 fusion. The right L5 screw traverses the inferior aspect of the right L4-5 neural foramen. 2. Moderate right lateral recess and right neural foraminal stenosis at L5-S1 due to spurring. Electronically Signed   By: Logan Bores M.D.   On: 06/04/2022 12:10   MR THORACIC SPINE WO CONTRAST  Result Date: 06/04/2022 CLINICAL DATA:  Lumbar radiculopathy. Chronic mid and lower back pain with pain radiating into the shoulders. Prior lumbar surgery. EXAM: MRI THORACIC SPINE WITHOUT CONTRAST TECHNIQUE: Multiplanar, multisequence MR imaging of the thoracic spine was performed. No intravenous contrast was administered. COMPARISON:  CTA chest, abdomen, and pelvis 05/13/2022. FINDINGS: Alignment: Increased upper thoracic kyphosis. Thoracolumbar dextroscoliosis. No significant listhesis. Vertebrae: No fracture or suspicious marrow lesion. Prominent degenerative endplate edema at C37-62. Mild degenerative endplate edema at G31-51 and minimal edema at T6-7. Cord:  Normal signal. Paraspinal and other soft tissues: Bilateral renal cysts. Disc levels: Incompletely evaluated disc degeneration in the cervical spine including at C5-6 where a disc osteophyte complex may contact and mildly deform the spinal cord. Small left paracentral disc protrusion at T2-3 without significant spinal cord mass effect or stenosis. Mild disc bulging in the midthoracic spine without  stenosis. At T10-11, disc bulging, a right subarticular to foraminal disc protrusion, and asymmetrically severe right facet arthrosis result in mild spinal stenosis and severe right neural foraminal stenosis. At T11-12, disc bulging and facet arthrosis result in borderline spinal stenosis without neural foraminal stenosis. IMPRESSION: 1. Multilevel thoracic disc and facet degeneration, most notable at T10-11 where there is mild spinal  stenosis and severe right neural foraminal stenosis. 2. Prominent degenerative endplate edema at V49-44. Electronically Signed   By: Logan Bores M.D.   On: 06/04/2022 11:43    Assessment & Plan:   Problem List Items Addressed This Visit     Diabetes mellitus type 2 in obese ([place])    [employer] Rybelsus      Relevant Medications   Semaglutide (RYBELSUS) 3 MG TABS   Essential hypertension       Cont on Telmisartan, Lasix      Lumbar disc disease    Chronic Continue with oxycodone 15 mg 4 times a day as needed  Potential benefits of a long term opioids use as well as potential risks (i.e. addiction risk, apnea etc) and complications (i.e. Somnolence, constipation and others) were explained to the patient and were aknowledged. A complex case We talked about his latest X rays, NS visit w/Dr Marcello Moores Pain is worse w/PT       Obesity    Refractory Diet discussed       Relevant Medications   Semaglutide (RYBELSUS) 3 MG TABS   Paroxysmal atrial fibrillation (HCC)    Cont on anticoagulation - Eliquis         Meds ordered this encounter  Medications   Semaglutide (RYBELSUS) 3 MG TABS    Sig: Take 3 mg by mouth daily.    Dispense:  30 tablet    Refill:  3   oxyCODONE (ROXICODONE) 15 MG immediate release tablet    Sig: Take 1 tablet (15 mg total) by mouth every 4 (four) hours as needed for pain. Please fill on or after 07/10/22    Dispense:  120 tablet    Refill:  0      Follow-up: Return in about 3 months (around 10/10/2022) for a follow-up  visit.  Walker Kehr, MD

## 2022-07-11 DIAGNOSIS — M5416 Radiculopathy, lumbar region: Secondary | ICD-10-CM | POA: Diagnosis not present

## 2022-07-11 DIAGNOSIS — M2569 Stiffness of other specified joint, not elsewhere classified: Secondary | ICD-10-CM | POA: Diagnosis not present

## 2022-07-11 DIAGNOSIS — R293 Abnormal posture: Secondary | ICD-10-CM | POA: Diagnosis not present

## 2022-07-11 DIAGNOSIS — M6281 Muscle weakness (generalized): Secondary | ICD-10-CM | POA: Diagnosis not present

## 2022-07-17 ENCOUNTER — Ambulatory Visit: Payer: Medicare Other | Admitting: Internal Medicine

## 2022-07-18 ENCOUNTER — Telehealth: Payer: Self-pay | Admitting: *Deleted

## 2022-07-18 NOTE — Telephone Encounter (Signed)
Rec'd fax pt need PA for Rybelsus 3 mg. Submitted w/(Key: [id number]) PA sent to [email or web address] Chess

## 2022-07-18 NOTE — Telephone Encounter (Signed)
Rec'd determination med was APPROVED. Effective 06/18/2022 through 07/18/2023. Faxed approval to [email or web address] Chess

## 2022-07-26 DIAGNOSIS — H401212 Low-tension glaucoma, right eye, moderate stage: Secondary | ICD-10-CM | POA: Diagnosis not present

## 2022-07-26 DIAGNOSIS — H40012 Open angle with borderline findings, low risk, left eye: Secondary | ICD-10-CM | POA: Diagnosis not present

## 2022-08-07 DIAGNOSIS — Z23 Encounter for immunization: Secondary | ICD-10-CM | POA: Diagnosis not present

## 2022-08-09 ENCOUNTER — Encounter: Payer: Self-pay | Admitting: Internal Medicine

## 2022-08-09 ENCOUNTER — Ambulatory Visit (INDEPENDENT_AMBULATORY_CARE_PROVIDER_SITE_OTHER): Payer: Medicare Other | Admitting: Internal Medicine

## 2022-08-09 DIAGNOSIS — E1169 Type 2 diabetes mellitus with other specified complication: Secondary | ICD-10-CM | POA: Diagnosis not present

## 2022-08-09 DIAGNOSIS — M519 Unspecified thoracic, thoracolumbar and lumbosacral intervertebral disc disorder: Secondary | ICD-10-CM

## 2022-08-09 DIAGNOSIS — E669 Obesity, unspecified: Secondary | ICD-10-CM

## 2022-08-09 DIAGNOSIS — I200 Unstable angina: Secondary | ICD-10-CM

## 2022-08-09 DIAGNOSIS — I10 Essential (primary) hypertension: Secondary | ICD-10-CM | POA: Diagnosis not present

## 2022-08-09 DIAGNOSIS — Z6835 Body mass index (BMI) 35.0-35.9, adult: Secondary | ICD-10-CM

## 2022-08-09 MED ORDER — OXYCODONE HCL 15 MG PO TABS: 15.0000 mg | ORAL_TABLET | ORAL | 0 refills | Status: DC | PRN

## 2022-08-09 NOTE — Assessment & Plan Note (Signed)
Chronic Continue with oxycodone 15 mg 4 times a day as needed  Potential benefits of a long term opioids use as well as potential risks (i.e. addiction risk, apnea etc) and complications (i.e. Somnolence, constipation and others) were explained to the patient and were aknowledged.

## 2022-08-09 NOTE — Assessment & Plan Note (Signed)
Cont on Telmisartan, Lasix Cont w/ wt loss

## 2022-08-09 NOTE — Progress Notes (Signed)
Subjective:  Patient ID: Cody Frazier, male    DOB: 07/17/1946  Age: 76 y.o. MRN: [id number]  CC: Follow-up (4 week f/u)   HPI Cody Frazier presents for DM, LBP, HTN  Outpatient Medications Prior to Visit  Medication Sig Dispense Refill   acetaminophen (TYLENOL) 500 MG tablet Take 1,000 mg by mouth every 6 (six) hours as needed for mild pain or moderate pain. Maximum daily dose of 3g     atorvastatin (LIPITOR) 20 MG tablet TAKE 1 TABLET BY MOUTH EVERY DAY 90 tablet 3   cetirizine (ZYRTEC) 10 MG tablet Take 10 mg by mouth daily.     Cholecalciferol (VITAMIN D3) 1.25 MG (50000 UT) CAPS TAKE 1 CAPSULE BY MOUTH EVERY 30 DAYS. 3 capsule 3   ELIQUIS 2.5 MG TABS tablet TAKE 1 TABLET BY MOUTH TWICE A DAY (Patient taking differently: Take 2.5 mg by mouth 2 (two) times daily.) 180 tablet 3   famotidine (PEPCID) 40 MG tablet Take 1 tablet (40 mg total) by mouth at bedtime. 30 tablet 11   fluticasone (FLONASE) 50 MCG/ACT nasal spray SPRAY 2 SPRAYS INTO EACH NOSTRIL EVERY DAY 48 mL 3   furosemide (LASIX) 40 MG tablet Take 1 tablet (40 mg total) by mouth daily as needed for edema. TAKE '40MG'$  DAILY x3 DAYS THEN TAKE '40MG'$  EVERY OTHER DAY THEN BACK TO PRN 45 tablet 12   hyoscyamine (LEVSIN SL) 0.125 MG SL tablet Place 1-2 tablets (0.125-0.25 mg total) under the tongue every 4 (four) hours as needed for cramping. 120 tablet 1   latanoprost (XALATAN) 0.005 % ophthalmic solution Place 1 drop into both eyes at bedtime.  11   metoprolol succinate (TOPROL-XL) 25 MG 24 hr tablet TAKE 1 TABLET BY MOUTH EVERY DAY (Patient taking differently: Take 25 mg by mouth daily.) 90 tablet 3   pantoprazole (PROTONIX) 40 MG tablet Take 1 tablet (40 mg total) by mouth 2 (two) times daily. 60 tablet 11   Semaglutide (RYBELSUS) 3 MG TABS Take 3 mg by mouth daily. 30 tablet 3   spironolactone (ALDACTONE) 25 MG tablet TAKE 1 TABLET BY MOUTH EVERY DAY (Patient taking differently: Take 25 mg by mouth daily.) 90 tablet 3    telmisartan (MICARDIS) 20 MG tablet TAKE 1 TABLET BY MOUTH EVERY DAY 90 tablet 3   timolol (TIMOPTIC) 0.25 % ophthalmic solution Place 1 drop into both eyes 2 (two) times daily.      triamcinolone cream (KENALOG) 0.1 % Apply 1 application topically 3 (three) times daily. 80 g 3   oxyCODONE (ROXICODONE) 15 MG immediate release tablet Take 1 tablet (15 mg total) by mouth every 4 (four) hours as needed for pain. Please fill on or after 07/10/22 120 tablet 0   No facility-administered medications prior to visit.    ROS: Review of Systems  Constitutional:  Positive for fatigue. Negative for appetite change and unexpected weight change.  HENT:  Negative for congestion, nosebleeds, sneezing, sore throat and trouble swallowing.   Eyes:  Negative for itching and visual disturbance.  Respiratory:  Negative for cough.   Cardiovascular:  Negative for chest pain, palpitations and leg swelling.  Gastrointestinal:  Negative for abdominal distention, blood in stool, diarrhea and nausea.  Genitourinary:  Negative for frequency and hematuria.  Musculoskeletal:  Positive for back pain and gait problem. Negative for joint swelling and neck pain.  Skin:  Negative for rash.  Neurological:  Negative for dizziness, tremors, speech difficulty and weakness.  Psychiatric/Behavioral:  Negative for  agitation, dysphoric mood, sleep disturbance and suicidal ideas. The patient is not nervous/anxious.     Objective:  BP 130/80 (BP Location: Left Arm)   Pulse 88   Temp 98.4 F (36.9 C) (Oral)   Ht '5\' 6"'$  (1.676 m)   Wt 233 lb 3.2 oz (105.8 kg)   SpO2 96%   BMI 37.64 kg/m   BP Readings from Last 3 Encounters:  08/09/22 130/80  07/10/22 120/72  05/21/22 110/70    Wt Readings from Last 3 Encounters:  08/09/22 233 lb 3.2 oz (105.8 kg)  07/10/22 238 lb 6.4 oz (108.1 kg)  05/21/22 238 lb (108 kg)    Physical Exam Constitutional:      General: He is not in acute distress.    Appearance: He is well-developed.  He is obese.     Comments: NAD  Eyes:     Conjunctiva/sclera: Conjunctivae normal.     Pupils: Pupils are equal, round, and reactive to light.  Neck:     Thyroid: No thyromegaly.     Vascular: No JVD.  Cardiovascular:     Rate and Rhythm: Normal rate and regular rhythm.     Heart sounds: Normal heart sounds. No murmur heard.    No friction rub. No gallop.  Pulmonary:     Effort: Pulmonary effort is normal. No respiratory distress.     Breath sounds: Normal breath sounds. No wheezing or rales.  Chest:     Chest wall: No tenderness.  Abdominal:     General: Bowel sounds are normal. There is no distension.     Palpations: Abdomen is soft. There is no mass.     Tenderness: There is no abdominal tenderness. There is no guarding or rebound.  Musculoskeletal:        General: Tenderness present. Normal range of motion.     Cervical back: Normal range of motion.  Lymphadenopathy:     Cervical: No cervical adenopathy.  Skin:    General: Skin is warm and dry.     Findings: No rash.  Neurological:     Mental Status: He is alert and oriented to person, place, and time.     Cranial Nerves: No cranial nerve deficit.     Motor: No abnormal muscle tone.     Coordination: Coordination normal.     Gait: Gait normal.     Deep Tendon Reflexes: Reflexes are normal and symmetric.  Psychiatric:        Behavior: Behavior normal.        Thought Content: Thought content normal.        Judgment: Judgment normal.   LS w/pain Using a cane  Lab Results  Component Value Date   WBC 9.1 05/13/2022   HGB 13.8 05/13/2022   HCT 40.4 05/13/2022   PLT 291 05/13/2022   GLUCOSE 120 (H) 05/13/2022   CHOL 196 11/20/2018   TRIG 269.0 (H) 11/20/2018   HDL 43.60 11/20/2018   LDLDIRECT 109.0 11/20/2018   LDLCALC 86 11/25/2017   ALT 15 05/13/2022   AST 18 05/13/2022   NA 142 05/13/2022   K 4.2 05/13/2022   CL 108 05/13/2022   CREATININE 1.07 05/13/2022   BUN 11 05/13/2022   CO2 24 05/13/2022   TSH  0.96 07/13/2021   PSA 0.16 11/20/2018   INR 1.1 08/12/2020   HGBA1C 5.7 07/13/2021    MR LUMBAR SPINE WO CONTRAST  Result Date: 06/04/2022 CLINICAL DATA:  Lumbar radiculopathy. Chronic mid and lower back pain with pain  radiating into the shoulders. Prior lumbar surgery. EXAM: MRI LUMBAR SPINE WITHOUT CONTRAST TECHNIQUE: Multiplanar, multisequence MR imaging of the lumbar spine was performed. No intravenous contrast was administered. COMPARISON:  CTA chest, abdomen, and pelvis 05/13/2022. Lumbar myelogram 04/19/2008. FINDINGS: Segmentation:  Standard. Alignment: Thoracolumbar dextroscoliosis. Trace fused anterolisthesis of L5 on S1 and trace retrolisthesis of L1 on L2. Vertebrae: No fracture or suspicious marrow lesion. Prior L2-S1 posterior fusion. Interbody spacers at L2-3, L3-4, and L4-5 with interbody osseous fusion at these levels as well as at L1-2 and L5-S1. Prominent degenerative endplate edema at U93-23 as noted on today's separate thoracic spine MRI. Conus medullaris and cauda equina: Conus extends to the L1-2 level. Conus and cauda equina appear normal. Paraspinal and other soft tissues: Bilateral renal cysts. Postoperative changes in the posterior lumbar soft tissues. Fluid collection in the laminectomy bed from L3-S1 without mass effect on the thecal sac. Disc levels: Lower thoracic spine reported separately. T12-L1: Disc desiccation and severe disc space narrowing. Mild disc bulging and facet arthrosis without significant stenosis. L1-2: Prior posterior decompression. Retrolisthesis and endplate spurring without significant stenosis. L2-3: Prior posterior decompression and fusion.  No stenosis. L3-4: Prior posterior decompression and fusion.  No stenosis. L4-5: Prior posterior decompression and fusion. Widely patent spinal canal and left neural foramen. The right L5 screw courses superior to the pedicle, traversing the inferior aspect of the right L4 neural foramen. L5-S1: Prior posterior  decompression and fusion. Right eccentric disc bulging and endplate and facet spurring result in moderate right lateral recess and moderate right neural foraminal stenosis. No generalized spinal stenosis. Patent left neural foramen. IMPRESSION: 1. L2-S1 fusion. The right L5 screw traverses the inferior aspect of the right L4-5 neural foramen. 2. Moderate right lateral recess and right neural foraminal stenosis at L5-S1 due to spurring. Electronically Signed   By: Logan Bores M.D.   On: 06/04/2022 12:10   MR THORACIC SPINE WO CONTRAST  Result Date: 06/04/2022 CLINICAL DATA:  Lumbar radiculopathy. Chronic mid and lower back pain with pain radiating into the shoulders. Prior lumbar surgery. EXAM: MRI THORACIC SPINE WITHOUT CONTRAST TECHNIQUE: Multiplanar, multisequence MR imaging of the thoracic spine was performed. No intravenous contrast was administered. COMPARISON:  CTA chest, abdomen, and pelvis 05/13/2022. FINDINGS: Alignment: Increased upper thoracic kyphosis. Thoracolumbar dextroscoliosis. No significant listhesis. Vertebrae: No fracture or suspicious marrow lesion. Prominent degenerative endplate edema at F57-32. Mild degenerative endplate edema at K02-54 and minimal edema at T6-7. Cord:  Normal signal. Paraspinal and other soft tissues: Bilateral renal cysts. Disc levels: Incompletely evaluated disc degeneration in the cervical spine including at C5-6 where a disc osteophyte complex may contact and mildly deform the spinal cord. Small left paracentral disc protrusion at T2-3 without significant spinal cord mass effect or stenosis. Mild disc bulging in the midthoracic spine without stenosis. At T10-11, disc bulging, a right subarticular to foraminal disc protrusion, and asymmetrically severe right facet arthrosis result in mild spinal stenosis and severe right neural foraminal stenosis. At T11-12, disc bulging and facet arthrosis result in borderline spinal stenosis without neural foraminal stenosis.  IMPRESSION: 1. Multilevel thoracic disc and facet degeneration, most notable at T10-11 where there is mild spinal stenosis and severe right neural foraminal stenosis. 2. Prominent degenerative endplate edema at Y70-62. Electronically Signed   By: Logan Bores M.D.   On: 06/04/2022 11:43    Assessment & Plan:   Problem List Items Addressed This Visit     Diabetes mellitus type 2 in obese ([place])  Cont w/Rybelsus      Essential hypertension    Cont on Telmisartan, Lasix Cont w/ wt loss      Lumbar disc disease    Chronic Continue with oxycodone 15 mg 4 times a day as needed  Potential benefits of a long term opioids use as well as potential risks (i.e. addiction risk, apnea etc) and complications (i.e. Somnolence, constipation and others) were explained to the patient and were aknowledged.      Obesity    Loosing wt         Meds ordered this encounter  Medications   oxyCODONE (ROXICODONE) 15 MG immediate release tablet    Sig: Take 1 tablet (15 mg total) by mouth every 4 (four) hours as needed for pain. Please fill on or after 08/09/22    Dispense:  120 tablet    Refill:  0      Follow-up: Return in about 2 months (around 10/09/2022) for a follow-up visit.  Walker Kehr, MD

## 2022-08-09 NOTE — Assessment & Plan Note (Signed)
Cont w/Rybelsus

## 2022-08-09 NOTE — Assessment & Plan Note (Signed)
Loosing wt 

## 2022-08-22 ENCOUNTER — Encounter: Payer: Self-pay | Admitting: Cardiovascular Disease

## 2022-08-22 ENCOUNTER — Ambulatory Visit: Payer: Medicare Other | Attending: Cardiovascular Disease | Admitting: Cardiovascular Disease

## 2022-08-22 VITALS — BP 112/72 | HR 84 | Ht 65.0 in | Wt 235.2 lb

## 2022-08-22 DIAGNOSIS — I480 Paroxysmal atrial fibrillation: Secondary | ICD-10-CM

## 2022-08-22 DIAGNOSIS — I2721 Secondary pulmonary arterial hypertension: Secondary | ICD-10-CM | POA: Diagnosis not present

## 2022-08-22 DIAGNOSIS — I700 Atherosclerosis of aorta: Secondary | ICD-10-CM | POA: Diagnosis not present

## 2022-08-22 DIAGNOSIS — I5032 Chronic diastolic (congestive) heart failure: Secondary | ICD-10-CM | POA: Diagnosis not present

## 2022-08-22 DIAGNOSIS — D6869 Other thrombophilia: Secondary | ICD-10-CM | POA: Diagnosis not present

## 2022-08-22 DIAGNOSIS — I350 Nonrheumatic aortic (valve) stenosis: Secondary | ICD-10-CM

## 2022-08-22 DIAGNOSIS — D500 Iron deficiency anemia secondary to blood loss (chronic): Secondary | ICD-10-CM | POA: Diagnosis not present

## 2022-08-22 DIAGNOSIS — I200 Unstable angina: Secondary | ICD-10-CM

## 2022-08-22 DIAGNOSIS — I10 Essential (primary) hypertension: Secondary | ICD-10-CM | POA: Diagnosis not present

## 2022-08-22 DIAGNOSIS — G4733 Obstructive sleep apnea (adult) (pediatric): Secondary | ICD-10-CM | POA: Diagnosis not present
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
## 2022-08-22 NOTE — Patient Instructions (Signed)
Medication Instructions:  No changes *If you need a refill on your cardiac medications before your next appointment, please call your pharmacy*   Lab Work: None ordered If you have labs (blood work) drawn today and your tests are completely normal, you will receive your results only by: MyChart Message (if you have MyChart) OR A paper copy in the mail If you have any lab test that is abnormal or we need to change your treatment, we will call you to review the results.   Testing/Procedures: None ordered   Follow-Up: At [facility], you and your health needs are our priority.  As part of our continuing mission to provide you with exceptional heart care, we have created designated Provider Care Teams.  These Care Teams include your primary Cardiologist (physician) and Advanced Practice Providers (APPs -  Physician Assistants and Nurse Practitioners) who all work together to provide you with the care you need, when you need it.  We recommend signing up for the patient portal called "MyChart".  Sign up information is provided on this After Visit Summary.  MyChart is used to connect with patients for Virtual Visits (Telemedicine).  Patients are able to view lab/test results, encounter notes, upcoming appointments, etc.  Non-urgent messages can be sent to your provider as well.   To learn more about what you can do with MyChart, go to [email or web address].    Your next appointment:   12 month(s)  The format for your next appointment:   In Person  Provider:   Mihai Croitoru, MD      Important Information About Sugar       

## 2022-10-09 ENCOUNTER — Ambulatory Visit (INDEPENDENT_AMBULATORY_CARE_PROVIDER_SITE_OTHER): Payer: Medicare Other | Admitting: Internal Medicine

## 2022-10-09 ENCOUNTER — Encounter: Payer: Self-pay | Admitting: Internal Medicine

## 2022-10-09 VITALS — BP 120/74 | HR 85 | Temp 98.4°F | Ht 65.0 in | Wt 232.0 lb

## 2022-10-09 DIAGNOSIS — I480 Paroxysmal atrial fibrillation: Secondary | ICD-10-CM | POA: Diagnosis not present

## 2022-10-09 DIAGNOSIS — E669 Obesity, unspecified: Secondary | ICD-10-CM

## 2022-10-09 DIAGNOSIS — F419 Anxiety disorder, unspecified: Secondary | ICD-10-CM | POA: Diagnosis not present

## 2022-10-09 DIAGNOSIS — E1169 Type 2 diabetes mellitus with other specified complication: Secondary | ICD-10-CM | POA: Diagnosis not present

## 2022-10-09 DIAGNOSIS — I10 Essential (primary) hypertension: Secondary | ICD-10-CM

## 2022-10-09 DIAGNOSIS — I200 Unstable angina: Secondary | ICD-10-CM | POA: Diagnosis not present

## 2022-10-09 DIAGNOSIS — I5032 Chronic diastolic (congestive) heart failure: Secondary | ICD-10-CM | POA: Diagnosis not present

## 2022-10-09 MED ORDER — OXYCODONE HCL 15 MG PO TABS
15.0000 mg | ORAL_TABLET | ORAL | 0 refills | Status: DC | PRN
Start: 2022-10-09 — End: 2023-01-02

## 2022-10-09 MED ORDER — OXYCODONE HCL 15 MG PO TABS: 15.0000 mg | ORAL_TABLET | Freq: Four times a day (QID) | ORAL | 0 refills | Status: DC | PRN

## 2022-10-09 MED ORDER — RYBELSUS 7 MG PO TABS: 1.0000 | ORAL_TABLET | Freq: Every day | ORAL | 3 refills | Status: DC

## 2022-10-09 NOTE — Assessment & Plan Note (Signed)
On Buspar

## 2022-10-09 NOTE — Progress Notes (Signed)
Subjective:  Patient ID: Cody Frazier, male    DOB: June 18, 1946  Age: 76 y.o. MRN: [id number]  CC: Follow-up   HPI Cody Frazier presents for LBP, DM, obesity  Wife was sick w/vertigo  Outpatient Medications Prior to Visit  Medication Sig Dispense Refill   acetaminophen (TYLENOL) 500 MG tablet Take 1,000 mg by mouth every 6 (six) hours as needed for mild pain or moderate pain. Maximum daily dose of 3g     atorvastatin (LIPITOR) 20 MG tablet TAKE 1 TABLET BY MOUTH EVERY DAY 90 tablet 3   cetirizine (ZYRTEC) 10 MG tablet Take 10 mg by mouth daily.     Cholecalciferol (VITAMIN D3) 1.25 MG (50000 UT) CAPS TAKE 1 CAPSULE BY MOUTH EVERY 30 DAYS. 3 capsule 3   ELIQUIS 2.5 MG TABS tablet TAKE 1 TABLET BY MOUTH TWICE A DAY (Patient taking differently: Take 2.5 mg by mouth 2 (two) times daily.) 180 tablet 3   famotidine (PEPCID) 40 MG tablet Take 1 tablet (40 mg total) by mouth at bedtime. 30 tablet 11   fluticasone (FLONASE) 50 MCG/ACT nasal spray SPRAY 2 SPRAYS INTO EACH NOSTRIL EVERY DAY 48 mL 3   furosemide (LASIX) 40 MG tablet Take 1 tablet (40 mg total) by mouth daily as needed for edema. TAKE '40MG'$  DAILY x3 DAYS THEN TAKE '40MG'$  EVERY OTHER DAY THEN BACK TO PRN 45 tablet 12   hyoscyamine (LEVSIN SL) 0.125 MG SL tablet Place 1-2 tablets (0.125-0.25 mg total) under the tongue every 4 (four) hours as needed for cramping. 120 tablet 1   latanoprost (XALATAN) 0.005 % ophthalmic solution Place 1 drop into both eyes at bedtime.  11   metoprolol succinate (TOPROL-XL) 25 MG 24 hr tablet TAKE 1 TABLET BY MOUTH EVERY DAY (Patient taking differently: Take 25 mg by mouth daily.) 90 tablet 3   pantoprazole (PROTONIX) 40 MG tablet Take 1 tablet (40 mg total) by mouth 2 (two) times daily. 60 tablet 11   Semaglutide (RYBELSUS) 3 MG TABS Take 3 mg by mouth daily. 30 tablet 3   spironolactone (ALDACTONE) 25 MG tablet TAKE 1 TABLET BY MOUTH EVERY DAY (Patient taking differently: Take 25 mg by mouth daily.) 90  tablet 3   telmisartan (MICARDIS) 20 MG tablet TAKE 1 TABLET BY MOUTH EVERY DAY 90 tablet 3   timolol (TIMOPTIC) 0.25 % ophthalmic solution Place 1 drop into both eyes 2 (two) times daily.      triamcinolone cream (KENALOG) 0.1 % Apply 1 application topically 3 (three) times daily. 80 g 3   oxyCODONE (ROXICODONE) 15 MG immediate release tablet Take 1 tablet (15 mg total) by mouth every 4 (four) hours as needed for pain. Please fill on or after 08/09/22 120 tablet 0   No facility-administered medications prior to visit.    ROS: Review of Systems  Constitutional:  Negative for appetite change, fatigue and unexpected weight change.  HENT:  Negative for congestion, nosebleeds, sneezing, sore throat and trouble swallowing.   Eyes:  Negative for itching and visual disturbance.  Respiratory:  Negative for cough.   Cardiovascular:  Negative for chest pain, palpitations and leg swelling.  Gastrointestinal:  Negative for abdominal distention, blood in stool, diarrhea and nausea.  Genitourinary:  Negative for frequency and hematuria.  Musculoskeletal:  Positive for back pain and gait problem. Negative for joint swelling and neck pain.  Skin:  Negative for rash.  Neurological:  Negative for dizziness, tremors, speech difficulty and weakness.  Psychiatric/Behavioral:  Negative for  agitation, dysphoric mood, sleep disturbance and suicidal ideas. The patient is not nervous/anxious.     Objective:  BP 120/74 (BP Location: Left Arm, Patient Position: Sitting, Cuff Size: Normal)   Pulse 85   Temp 98.4 F (36.9 C) (Oral)   Ht '5\' 5"'$  (1.651 m)   Wt 232 lb (105.2 kg)   SpO2 98%   BMI 38.61 kg/m   BP Readings from Last 3 Encounters:  10/09/22 120/74  08/22/22 112/72  08/09/22 130/80    Wt Readings from Last 3 Encounters:  10/09/22 232 lb (105.2 kg)  08/22/22 235 lb 3.2 oz (106.7 kg)  08/09/22 233 lb 3.2 oz (105.8 kg)    Physical Exam Constitutional:      General: He is not in acute  distress.    Appearance: He is well-developed. He is obese.     Comments: NAD  Eyes:     Conjunctiva/sclera: Conjunctivae normal.     Pupils: Pupils are equal, round, and reactive to light.  Neck:     Thyroid: No thyromegaly.     Vascular: No JVD.  Cardiovascular:     Rate and Rhythm: Normal rate and regular rhythm.     Heart sounds: Normal heart sounds. No murmur heard.    No friction rub. No gallop.  Pulmonary:     Effort: Pulmonary effort is normal. No respiratory distress.     Breath sounds: Normal breath sounds. No wheezing or rales.  Chest:     Chest wall: No tenderness.  Abdominal:     General: Bowel sounds are normal. There is no distension.     Palpations: Abdomen is soft. There is no mass.     Tenderness: There is no abdominal tenderness. There is no guarding or rebound.  Musculoskeletal:        General: Tenderness present. Normal range of motion.     Cervical back: Normal range of motion.  Lymphadenopathy:     Cervical: No cervical adenopathy.  Skin:    General: Skin is warm and dry.     Findings: No rash.  Neurological:     Mental Status: He is alert and oriented to person, place, and time.     Cranial Nerves: No cranial nerve deficit.     Motor: No abnormal muscle tone.     Coordination: Coordination normal.     Gait: Gait normal.     Deep Tendon Reflexes: Reflexes are normal and symmetric.  Psychiatric:        Behavior: Behavior normal.        Thought Content: Thought content normal.        Judgment: Judgment normal.   LS w/pain  Lab Results  Component Value Date   WBC 9.1 05/13/2022   HGB 13.8 05/13/2022   HCT 40.4 05/13/2022   PLT 291 05/13/2022   GLUCOSE 120 (H) 05/13/2022   CHOL 196 11/20/2018   TRIG 269.0 (H) 11/20/2018   HDL 43.60 11/20/2018   LDLDIRECT 109.0 11/20/2018   LDLCALC 86 11/25/2017   ALT 15 05/13/2022   AST 18 05/13/2022   NA 142 05/13/2022   K 4.2 05/13/2022   CL 108 05/13/2022   CREATININE 1.07 05/13/2022   BUN 11  05/13/2022   CO2 24 05/13/2022   TSH 0.96 07/13/2021   PSA 0.16 11/20/2018   INR 1.1 08/12/2020   HGBA1C 5.7 07/13/2021    MR LUMBAR SPINE WO CONTRAST  Result Date: 06/04/2022 CLINICAL DATA:  Lumbar radiculopathy. Chronic mid and lower back pain with  pain radiating into the shoulders. Prior lumbar surgery. EXAM: MRI LUMBAR SPINE WITHOUT CONTRAST TECHNIQUE: Multiplanar, multisequence MR imaging of the lumbar spine was performed. No intravenous contrast was administered. COMPARISON:  CTA chest, abdomen, and pelvis 05/13/2022. Lumbar myelogram 04/19/2008. FINDINGS: Segmentation:  Standard. Alignment: Thoracolumbar dextroscoliosis. Trace fused anterolisthesis of L5 on S1 and trace retrolisthesis of L1 on L2. Vertebrae: No fracture or suspicious marrow lesion. Prior L2-S1 posterior fusion. Interbody spacers at L2-3, L3-4, and L4-5 with interbody osseous fusion at these levels as well as at L1-2 and L5-S1. Prominent degenerative endplate edema at E33-29 as noted on today's separate thoracic spine MRI. Conus medullaris and cauda equina: Conus extends to the L1-2 level. Conus and cauda equina appear normal. Paraspinal and other soft tissues: Bilateral renal cysts. Postoperative changes in the posterior lumbar soft tissues. Fluid collection in the laminectomy bed from L3-S1 without mass effect on the thecal sac. Disc levels: Lower thoracic spine reported separately. T12-L1: Disc desiccation and severe disc space narrowing. Mild disc bulging and facet arthrosis without significant stenosis. L1-2: Prior posterior decompression. Retrolisthesis and endplate spurring without significant stenosis. L2-3: Prior posterior decompression and fusion.  No stenosis. L3-4: Prior posterior decompression and fusion.  No stenosis. L4-5: Prior posterior decompression and fusion. Widely patent spinal canal and left neural foramen. The right L5 screw courses superior to the pedicle, traversing the inferior aspect of the right L4 neural  foramen. L5-S1: Prior posterior decompression and fusion. Right eccentric disc bulging and endplate and facet spurring result in moderate right lateral recess and moderate right neural foraminal stenosis. No generalized spinal stenosis. Patent left neural foramen. IMPRESSION: 1. L2-S1 fusion. The right L5 screw traverses the inferior aspect of the right L4-5 neural foramen. 2. Moderate right lateral recess and right neural foraminal stenosis at L5-S1 due to spurring. Electronically Signed   By: Logan Bores M.D.   On: 06/04/2022 12:10   MR THORACIC SPINE WO CONTRAST  Result Date: 06/04/2022 CLINICAL DATA:  Lumbar radiculopathy. Chronic mid and lower back pain with pain radiating into the shoulders. Prior lumbar surgery. EXAM: MRI THORACIC SPINE WITHOUT CONTRAST TECHNIQUE: Multiplanar, multisequence MR imaging of the thoracic spine was performed. No intravenous contrast was administered. COMPARISON:  CTA chest, abdomen, and pelvis 05/13/2022. FINDINGS: Alignment: Increased upper thoracic kyphosis. Thoracolumbar dextroscoliosis. No significant listhesis. Vertebrae: No fracture or suspicious marrow lesion. Prominent degenerative endplate edema at J18-84. Mild degenerative endplate edema at Z66-06 and minimal edema at T6-7. Cord:  Normal signal. Paraspinal and other soft tissues: Bilateral renal cysts. Disc levels: Incompletely evaluated disc degeneration in the cervical spine including at C5-6 where a disc osteophyte complex may contact and mildly deform the spinal cord. Small left paracentral disc protrusion at T2-3 without significant spinal cord mass effect or stenosis. Mild disc bulging in the midthoracic spine without stenosis. At T10-11, disc bulging, a right subarticular to foraminal disc protrusion, and asymmetrically severe right facet arthrosis result in mild spinal stenosis and severe right neural foraminal stenosis. At T11-12, disc bulging and facet arthrosis result in borderline spinal stenosis without  neural foraminal stenosis. IMPRESSION: 1. Multilevel thoracic disc and facet degeneration, most notable at T10-11 where there is mild spinal stenosis and severe right neural foraminal stenosis. 2. Prominent degenerative endplate edema at T01-60. Electronically Signed   By: Logan Bores M.D.   On: 06/04/2022 11:43    Assessment & Plan:   Problem List Items Addressed This Visit     Anxiety disorder - Primary    On Buspar  Chronic diastolic CHF (congestive heart failure) (HCC)    Loose wt NAS diet      Diabetes mellitus type 2 in obese (HCC)    Increase Rbelsus dose      Essential hypertension    Cont on Telmisartan, Lasix      Paroxysmal atrial fibrillation (HCC)    Cont on anticoagulation - Eliquis         Meds ordered this encounter  Medications   oxyCODONE (ROXICODONE) 15 MG immediate release tablet    Sig: Take 1 tablet (15 mg total) by mouth every 4 (four) hours as needed for pain.    Dispense:  120 tablet    Refill:  0    Please fill on or after 12/08/22   oxyCODONE (ROXICODONE) 15 MG immediate release tablet    Sig: Take 1 tablet (15 mg total) by mouth 4 (four) times daily as needed for pain.    Dispense:  120 tablet    Refill:  0    Please fill on or after 10/09/22   oxyCODONE (ROXICODONE) 15 MG immediate release tablet    Sig: Take 1 tablet (15 mg total) by mouth 4 (four) times daily as needed for pain.    Dispense:  120 tablet    Refill:  0    Please fill on or after 11/08/22      Follow-up: Return in about 3 months (around 01/09/2023) for a follow-up visit.  Walker Kehr, MD

## 2022-10-09 NOTE — Assessment & Plan Note (Signed)
Loose wt NAS diet

## 2022-10-09 NOTE — Assessment & Plan Note (Signed)
Cont on Telmisartan, Lasix

## 2022-10-09 NOTE — Assessment & Plan Note (Signed)
Increase Rbelsus dose

## 2022-10-09 NOTE — Assessment & Plan Note (Signed)
Cont on anticoagulation - Eliquis

## 2022-10-12 DIAGNOSIS — Z6833 Body mass index (BMI) 33.0-33.9, adult: Secondary | ICD-10-CM | POA: Diagnosis not present

## 2022-10-12 DIAGNOSIS — M419 Scoliosis, unspecified: Secondary | ICD-10-CM | POA: Diagnosis not present

## 2022-10-12 DIAGNOSIS — M5416 Radiculopathy, lumbar region: Secondary | ICD-10-CM | POA: Diagnosis not present

## 2022-10-15 ENCOUNTER — Ambulatory Visit (INDEPENDENT_AMBULATORY_CARE_PROVIDER_SITE_OTHER): Payer: Medicare Other | Admitting: Gastroenterology

## 2022-10-15 ENCOUNTER — Encounter: Payer: Self-pay | Admitting: Gastroenterology

## 2022-10-15 VITALS — BP 138/102 | HR 85 | Ht 65.0 in | Wt 230.0 lb

## 2022-10-15 DIAGNOSIS — D508 Other iron deficiency anemias: Secondary | ICD-10-CM | POA: Diagnosis not present

## 2022-10-15 DIAGNOSIS — K2270 Barrett's esophagus without dysplasia: Secondary | ICD-10-CM | POA: Diagnosis not present

## 2022-10-15 DIAGNOSIS — I200 Unstable angina: Secondary | ICD-10-CM

## 2022-10-15 DIAGNOSIS — Z800 Family history of malignant neoplasm of digestive organs: Secondary | ICD-10-CM | POA: Diagnosis not present

## 2022-10-15 MED ORDER — PANTOPRAZOLE SODIUM 40 MG PO TBEC: 40.0000 mg | DELAYED_RELEASE_TABLET | Freq: Two times a day (BID) | ORAL | 11 refills | Status: DC

## 2022-10-15 MED ORDER — FAMOTIDINE 40 MG PO TABS: 40.0000 mg | ORAL_TABLET | Freq: Every day | ORAL | 11 refills | Status: DC

## 2022-10-15 NOTE — Progress Notes (Signed)
    Assessment     GERD. Barrett's esophagus, long segment without dysplasia Family history of colon cancer PAF OSA PAH   Recommendations    Continue pantoprazole 40 mg twice daily and famotidine 40 mg at bedtime.  Follow antireflux measures.  Barrett's surveillance EGD tentatively planned for September 2024 however with his age and comorbidities we will likely discontinue surveillance EGDs. We will discuss further at his visit next year. Try to avoid very cold beverages Colonoscopy tentatively planned for October 2026 however given his age and comorbidities we will not plan for future surveillance colonoscopies. REV in 1 year   HPI    This is a 76 year old male with Barrett's esophagus, GERD and family history of colon cancer.  His reflux symptoms are under very good control.  He has occasional mild breakthrough symptoms.  Occasionally when drinking ice water he notes spasm in his mid chest with difficulty swallowing that resolves fairly promptly.   Labs / Imaging       Latest Ref Rng & Units 05/13/2022    3:13 AM 05/12/2022   11:45 PM 11/20/2021    2:26 PM  Hepatic Function  Total Protein 6.5 - 8.1 g/dL 6.4  6.8  6.7   Albumin 3.5 - 5.0 g/dL 3.5  3.7  4.3   AST 15 - 41 U/L _0 ALT 0 - 44 U/L _1 Alk Phosphatase 38 - 126 U/L 76  86  126   Total Bilirubin 0.3 - 1.2 mg/dL 0.9  0.8  0.5        Latest Ref Rng & Units 05/13/2022    3:13 AM 05/12/2022   11:45 PM 11/20/2021    2:26 PM  CBC  WBC 4.0 - 10.5 K/uL 9.1  8.7  6.2   Hemoglobin 13.0 - 17.0 g/dL 13.8  14.1  13.4   Hematocrit 39.0 - 52.0 % 40.4  42.1  39.7   Platelets 150 - 400 K/uL 291  304  312     Current Medications, Allergies, Past Medical History, Past Surgical History, Family History and Social History were reviewed in [employer] record.   Physical Exam: General: Well developed, well nourished, elderly, no acute distress Head: Normocephalic and atraumatic Eyes: Sclerae  anicteric, EOMI Ears: Normal auditory acuity Mouth: No deformities or lesions noted Lungs: Clear throughout to auscultation Heart: Regular rate and rhythm; No murmurs, rubs or bruits Abdomen: Soft, non tender and non distended. No masses, hepatosplenomegaly or hernias noted. Normal Bowel sounds Rectal: Not done Musculoskeletal: Scoliosis, symmetrical with no gross deformities  Pulses:  Normal pulses noted Extremities: No edema or deformities noted Neurological: Alert oriented x 4, grossly nonfocal Psychological:  Alert and cooperative. Normal mood and affect   Dealie Koelzer T. Fuller Plan, MD 10/15/2022, 1:42 PM

## 2022-10-15 NOTE — Patient Instructions (Signed)
We have sent the following medications to your pharmacy for you to pick up at your convenience: pantoprazole and famotidine.   The [facility] providers would like to encourage you to use [facility] to communicate with providers for non-urgent requests or questions.  Due to long hold times on the telephone, sending your provider a message by [facility] may be a faster and more efficient way to get a response.  Please allow 48 business hours for a response.  Please remember that this is for non-urgent requests.   Thank you for choosing me and [facility].  Pricilla Riffle. Dagoberto Ligas., MD., Marval Regal

## 2022-10-18 DIAGNOSIS — M47816 Spondylosis without myelopathy or radiculopathy, lumbar region: Secondary | ICD-10-CM | POA: Diagnosis not present

## 2022-11-01 DIAGNOSIS — M47816 Spondylosis without myelopathy or radiculopathy, lumbar region: Secondary | ICD-10-CM | POA: Diagnosis not present

## 2022-11-13 ENCOUNTER — Other Ambulatory Visit: Payer: Self-pay | Admitting: Internal Medicine

## 2022-12-07 ENCOUNTER — Other Ambulatory Visit: Payer: Self-pay | Admitting: Cardiovascular Disease

## 2022-12-07 ENCOUNTER — Other Ambulatory Visit: Payer: Self-pay | Admitting: Internal Medicine

## 2022-12-20 ENCOUNTER — Telehealth: Payer: Self-pay | Admitting: Cardiovascular Disease

## 2022-12-20 NOTE — Telephone Encounter (Signed)
Patient called stating he needs supplies for his CPAP machine called in [place]. He said he needs the face mask, tubing, the water holder.

## 2022-12-21 NOTE — Telephone Encounter (Signed)
Records and order to manage CPAP supplies sent to [facility].

## 2023-01-02 ENCOUNTER — Other Ambulatory Visit (HOSPITAL_COMMUNITY): Payer: Self-pay

## 2023-01-02 ENCOUNTER — Telehealth: Payer: Self-pay

## 2023-01-02 ENCOUNTER — Ambulatory Visit (INDEPENDENT_AMBULATORY_CARE_PROVIDER_SITE_OTHER): Payer: Medicare PPO | Admitting: Internal Medicine

## 2023-01-02 ENCOUNTER — Encounter: Payer: Self-pay | Admitting: Internal Medicine

## 2023-01-02 ENCOUNTER — Other Ambulatory Visit: Payer: Self-pay | Admitting: Internal Medicine

## 2023-01-02 VITALS — BP 118/78 | HR 80 | Temp 98.0°F | Ht 65.0 in | Wt 218.0 lb

## 2023-01-02 DIAGNOSIS — E1169 Type 2 diabetes mellitus with other specified complication: Secondary | ICD-10-CM

## 2023-01-02 DIAGNOSIS — E669 Obesity, unspecified: Secondary | ICD-10-CM

## 2023-01-02 DIAGNOSIS — M5442 Lumbago with sciatica, left side: Secondary | ICD-10-CM

## 2023-01-02 DIAGNOSIS — D485 Neoplasm of uncertain behavior of skin: Secondary | ICD-10-CM | POA: Diagnosis not present

## 2023-01-02 DIAGNOSIS — G8929 Other chronic pain: Secondary | ICD-10-CM

## 2023-01-02 DIAGNOSIS — Z6835 Body mass index (BMI) 35.0-35.9, adult: Secondary | ICD-10-CM

## 2023-01-02 DIAGNOSIS — M5441 Lumbago with sciatica, right side: Secondary | ICD-10-CM

## 2023-01-02 LAB — TSH: TSH: 1.07 u[IU]/mL (ref 0.35–5.50)

## 2023-01-02 LAB — HEMOGLOBIN A1C: Hgb A1c MFr Bld: 5.8 % (ref 4.6–6.5)

## 2023-01-02 MED ORDER — OXYCODONE HCL 15 MG PO TABS: 15.0000 mg | ORAL_TABLET | Freq: Four times a day (QID) | ORAL | 0 refills | Status: DC | PRN

## 2023-01-02 MED ORDER — OXYCODONE HCL 15 MG PO TABS: 15.0000 mg | ORAL_TABLET | ORAL | 0 refills | Status: DC | PRN

## 2023-01-02 MED ORDER — RYBELSUS 14 MG PO TABS: 14.0000 mg | ORAL_TABLET | Freq: Every day | ORAL | 3 refills | Status: DC

## 2023-01-02 NOTE — Assessment & Plan Note (Addendum)
Increase Rybelsus dose to 14 mg/d Wt Readings from Last 3 Encounters:  01/02/23 218 lb (98.9 kg)  10/15/22 230 lb (104.3 kg)  10/09/22 232 lb (105.2 kg)

## 2023-01-02 NOTE — Assessment & Plan Note (Signed)
L temple - sch skin bx

## 2023-01-02 NOTE — Assessment & Plan Note (Signed)
Increase Rbelsus dose to 14 mg/d

## 2023-01-02 NOTE — Telephone Encounter (Signed)
Pharmacy Patient Advocate Encounter   Received notification from [facility] that prior authorization for Oxycodone 85m tabs is required/requested, additional information is requested.  Per Test Claim: received a paid claim   PA additional information submitted on 01/02/23 to (ins) [employer] via fax at [phone]

## 2023-01-02 NOTE — Telephone Encounter (Signed)
Rybelsus not covered, please send in alternative medication

## 2023-01-02 NOTE — Progress Notes (Signed)
Subjective:  Patient ID: Cody Frazier, male    DOB: Jan 12, 1946  Age: 77 y.o. MRN: [id number]  CC: No chief complaint on file.   HPI KORNELL HOR presents for LBP, GERD, DM C/o a skin lesions L temple  Outpatient Medications Prior to Visit  Medication Sig Dispense Refill   acetaminophen (TYLENOL) 500 MG tablet Take 1,000 mg by mouth every 6 (six) hours as needed for mild pain or moderate pain. Maximum daily dose of 3g     atorvastatin (LIPITOR) 20 MG tablet TAKE 1 TABLET BY MOUTH EVERY DAY 90 tablet 3   cetirizine (ZYRTEC) 10 MG tablet Take 10 mg by mouth daily.     Cholecalciferol (VITAMIN D3) 1.25 MG (50000 UT) CAPS TAKE 1 CAPSULE BY MOUTH MONTHLY 3 capsule 3   ELIQUIS 2.5 MG TABS tablet TAKE 1 TABLET BY MOUTH TWICE A DAY 180 tablet 3   famotidine (PEPCID) 40 MG tablet Take 1 tablet (40 mg total) by mouth at bedtime. 30 tablet 11   fluticasone (FLONASE) 50 MCG/ACT nasal spray SPRAY 2 SPRAYS INTO EACH NOSTRIL EVERY DAY 48 mL 3   furosemide (LASIX) 40 MG tablet Take 1 tablet (40 mg total) by mouth daily as needed for edema. TAKE 40MG DAILY x3 DAYS THEN TAKE 40MG EVERY OTHER DAY THEN BACK TO PRN 45 tablet 12   hyoscyamine (LEVSIN SL) 0.125 MG SL tablet Place 1-2 tablets (0.125-0.25 mg total) under the tongue every 4 (four) hours as needed for cramping. 120 tablet 1   latanoprost (XALATAN) 0.005 % ophthalmic solution Place 1 drop into both eyes at bedtime.  11   metoprolol succinate (TOPROL-XL) 25 MG 24 hr tablet TAKE 1 TABLET BY MOUTH EVERY DAY (Patient taking differently: Take 25 mg by mouth daily.) 90 tablet 3   pantoprazole (PROTONIX) 40 MG tablet Take 1 tablet (40 mg total) by mouth 2 (two) times daily. 60 tablet 11   Semaglutide (RYBELSUS) 7 MG TABS Take 1 tablet by mouth daily. Take 30 min ac 30 tablet 3   spironolactone (ALDACTONE) 25 MG tablet TAKE 1 TABLET BY MOUTH EVERY DAY 90 tablet 3   telmisartan (MICARDIS) 20 MG tablet TAKE 1 TABLET BY MOUTH EVERY DAY 90 tablet 3    timolol (TIMOPTIC) 0.25 % ophthalmic solution Place 1 drop into both eyes 2 (two) times daily.      triamcinolone cream (KENALOG) 0.1 % Apply 1 application topically 3 (three) times daily. 80 g 3   oxyCODONE (ROXICODONE) 15 MG immediate release tablet Take 1 tablet (15 mg total) by mouth every 4 (four) hours as needed for pain. 120 tablet 0   oxyCODONE (ROXICODONE) 15 MG immediate release tablet Take 1 tablet (15 mg total) by mouth 4 (four) times daily as needed for pain. 120 tablet 0   oxyCODONE (ROXICODONE) 15 MG immediate release tablet Take 1 tablet (15 mg total) by mouth 4 (four) times daily as needed for pain. 120 tablet 0   No facility-administered medications prior to visit.    ROS: Review of Systems  Constitutional:  Positive for fatigue. Negative for appetite change and unexpected weight change.  HENT:  Negative for congestion, nosebleeds, sneezing, sore throat and trouble swallowing.   Eyes:  Negative for itching and visual disturbance.  Respiratory:  Negative for cough.   Cardiovascular:  Negative for chest pain, palpitations and leg swelling.  Gastrointestinal:  Negative for abdominal distention, blood in stool, diarrhea and nausea.  Genitourinary:  Negative for frequency and hematuria.  Musculoskeletal:  Positive for arthralgias, back pain and gait problem. Negative for joint swelling and neck pain.  Skin:  Negative for rash.  Neurological:  Positive for weakness. Negative for dizziness, tremors and speech difficulty.  Psychiatric/Behavioral:  Negative for agitation, dysphoric mood and sleep disturbance.     Objective:  BP 118/78 (BP Location: Left Arm, Patient Position: Sitting, Cuff Size: Normal)   Pulse 80   Temp 98 F (36.7 C) (Oral)   Ht 5' 5"$  (1.651 m)   Wt 218 lb (98.9 kg)   SpO2 98%   BMI 36.28 kg/m   BP Readings from Last 3 Encounters:  01/02/23 118/78  10/15/22 (!) 138/102  10/09/22 120/74    Wt Readings from Last 3 Encounters:  01/02/23 218 lb (98.9  kg)  10/15/22 230 lb (104.3 kg)  10/09/22 232 lb (105.2 kg)    Physical Exam Constitutional:      General: He is not in acute distress.    Appearance: He is well-developed. He is obese.     Comments: NAD  Eyes:     Conjunctiva/sclera: Conjunctivae normal.     Pupils: Pupils are equal, round, and reactive to light.  Neck:     Thyroid: No thyromegaly.     Vascular: No JVD.  Cardiovascular:     Rate and Rhythm: Normal rate and regular rhythm.     Heart sounds: Normal heart sounds. No murmur heard.    No friction rub. No gallop.  Pulmonary:     Effort: Pulmonary effort is normal. No respiratory distress.     Breath sounds: Normal breath sounds. No wheezing or rales.  Chest:     Chest wall: No tenderness.  Abdominal:     General: Bowel sounds are normal. There is no distension.     Palpations: Abdomen is soft. There is no mass.     Tenderness: There is no abdominal tenderness. There is no guarding or rebound.  Musculoskeletal:        General: Tenderness present. Normal range of motion.     Cervical back: Normal range of motion.  Lymphadenopathy:     Cervical: No cervical adenopathy.  Skin:    General: Skin is warm and dry.     Findings: No rash.  Neurological:     Mental Status: He is alert and oriented to person, place, and time.     Cranial Nerves: No cranial nerve deficit.     Motor: No abnormal muscle tone.     Coordination: Coordination normal.     Gait: Gait abnormal.     Deep Tendon Reflexes: Reflexes are normal and symmetric.  Psychiatric:        Behavior: Behavior normal.        Thought Content: Thought content normal.        Judgment: Judgment normal.   LS spine w/pain Using a cne Pigmented lesions on the L temple  Lab Results  Component Value Date   WBC 9.1 05/13/2022   HGB 13.8 05/13/2022   HCT 40.4 05/13/2022   PLT 291 05/13/2022   GLUCOSE 120 (H) 05/13/2022   CHOL 196 11/20/2018   TRIG 269.0 (H) 11/20/2018   HDL 43.60 11/20/2018   LDLDIRECT  109.0 11/20/2018   LDLCALC 86 11/25/2017   ALT 15 05/13/2022   AST 18 05/13/2022   NA 142 05/13/2022   K 4.2 05/13/2022   CL 108 05/13/2022   CREATININE 1.07 05/13/2022   BUN 11 05/13/2022   CO2 24 05/13/2022   TSH  0.96 07/13/2021   PSA 0.16 11/20/2018   INR 1.1 08/12/2020   HGBA1C 5.7 07/13/2021    MR LUMBAR SPINE WO CONTRAST  Result Date: 06/04/2022 CLINICAL DATA:  Lumbar radiculopathy. Chronic mid and lower back pain with pain radiating into the shoulders. Prior lumbar surgery. EXAM: MRI LUMBAR SPINE WITHOUT CONTRAST TECHNIQUE: Multiplanar, multisequence MR imaging of the lumbar spine was performed. No intravenous contrast was administered. COMPARISON:  CTA chest, abdomen, and pelvis 05/13/2022. Lumbar myelogram 04/19/2008. FINDINGS: Segmentation:  Standard. Alignment: Thoracolumbar dextroscoliosis. Trace fused anterolisthesis of L5 on S1 and trace retrolisthesis of L1 on L2. Vertebrae: No fracture or suspicious marrow lesion. Prior L2-S1 posterior fusion. Interbody spacers at L2-3, L3-4, and L4-5 with interbody osseous fusion at these levels as well as at L1-2 and L5-S1. Prominent degenerative endplate edema at QA348G as noted on today's separate thoracic spine MRI. Conus medullaris and cauda equina: Conus extends to the L1-2 level. Conus and cauda equina appear normal. Paraspinal and other soft tissues: Bilateral renal cysts. Postoperative changes in the posterior lumbar soft tissues. Fluid collection in the laminectomy bed from L3-S1 without mass effect on the thecal sac. Disc levels: Lower thoracic spine reported separately. T12-L1: Disc desiccation and severe disc space narrowing. Mild disc bulging and facet arthrosis without significant stenosis. L1-2: Prior posterior decompression. Retrolisthesis and endplate spurring without significant stenosis. L2-3: Prior posterior decompression and fusion.  No stenosis. L3-4: Prior posterior decompression and fusion.  No stenosis. L4-5: Prior  posterior decompression and fusion. Widely patent spinal canal and left neural foramen. The right L5 screw courses superior to the pedicle, traversing the inferior aspect of the right L4 neural foramen. L5-S1: Prior posterior decompression and fusion. Right eccentric disc bulging and endplate and facet spurring result in moderate right lateral recess and moderate right neural foraminal stenosis. No generalized spinal stenosis. Patent left neural foramen. IMPRESSION: 1. L2-S1 fusion. The right L5 screw traverses the inferior aspect of the right L4-5 neural foramen. 2. Moderate right lateral recess and right neural foraminal stenosis at L5-S1 due to spurring. Electronically Signed   By: Logan Bores M.D.   On: 06/04/2022 12:10   MR THORACIC SPINE WO CONTRAST  Result Date: 06/04/2022 CLINICAL DATA:  Lumbar radiculopathy. Chronic mid and lower back pain with pain radiating into the shoulders. Prior lumbar surgery. EXAM: MRI THORACIC SPINE WITHOUT CONTRAST TECHNIQUE: Multiplanar, multisequence MR imaging of the thoracic spine was performed. No intravenous contrast was administered. COMPARISON:  CTA chest, abdomen, and pelvis 05/13/2022. FINDINGS: Alignment: Increased upper thoracic kyphosis. Thoracolumbar dextroscoliosis. No significant listhesis. Vertebrae: No fracture or suspicious marrow lesion. Prominent degenerative endplate edema at QA348G. Mild degenerative endplate edema at [id number] and minimal edema at T6-7. Cord:  Normal signal. Paraspinal and other soft tissues: Bilateral renal cysts. Disc levels: Incompletely evaluated disc degeneration in the cervical spine including at C5-6 where a disc osteophyte complex may contact and mildly deform the spinal cord. Small left paracentral disc protrusion at T2-3 without significant spinal cord mass effect or stenosis. Mild disc bulging in the midthoracic spine without stenosis. At T10-11, disc bulging, a right subarticular to foraminal disc protrusion, and  asymmetrically severe right facet arthrosis result in mild spinal stenosis and severe right neural foraminal stenosis. At T11-12, disc bulging and facet arthrosis result in borderline spinal stenosis without neural foraminal stenosis. IMPRESSION: 1. Multilevel thoracic disc and facet degeneration, most notable at T10-11 where there is mild spinal stenosis and severe right neural foraminal stenosis. 2. Prominent degenerative endplate edema at QA348G.  Electronically Signed   By: Logan Bores M.D.   On: 06/04/2022 11:43    Assessment & Plan:   Problem List Items Addressed This Visit       Endocrine   Diabetes mellitus type 2 in obese ([facility]) - Primary    Increase Rbelsus dose to 14 mg/d      Relevant Medications   Semaglutide (RYBELSUS) 14 MG TABS   Other Relevant Orders   Comprehensive metabolic panel   Hemoglobin A1c   TSH     Musculoskeletal and Integument   Neoplasm of uncertain behavior of skin    L temple - sch skin bx        Other   Obesity    Increase Rybelsus dose to 14 mg/d Wt Readings from Last 3 Encounters:  01/02/23 218 lb (98.9 kg)  10/15/22 230 lb (104.3 kg)  10/09/22 232 lb (105.2 kg)        Relevant Medications   Semaglutide (RYBELSUS) 14 MG TABS   Other Relevant Orders   TSH      Meds ordered this encounter  Medications   Semaglutide (RYBELSUS) 14 MG TABS    Sig: Take 1 tablet (14 mg total) by mouth daily.    Dispense:  90 tablet    Refill:  3   oxyCODONE (ROXICODONE) 15 MG immediate release tablet    Sig: Take 1 tablet (15 mg total) by mouth every 4 (four) hours as needed for pain.    Dispense:  120 tablet    Refill:  0    Please fill on or after 01/06/23   oxyCODONE (ROXICODONE) 15 MG immediate release tablet    Sig: Take 1 tablet (15 mg total) by mouth 4 (four) times daily as needed for pain.    Dispense:  120 tablet    Refill:  0    Please fill on or after 02/05/23   oxyCODONE (ROXICODONE) 15 MG immediate release tablet    Sig: Take 1 tablet  (15 mg total) by mouth 4 (four) times daily as needed for pain.    Dispense:  120 tablet    Refill:  0    Please fill on or after 03/07/2023      Follow-up: Return in about 3 months (around 04/02/2023) for a follow-up visit.  Walker Kehr, MD

## 2023-01-02 NOTE — Assessment & Plan Note (Signed)
Cont on Oxycodone  Potential benefits of a long term opioids use as well as potential risks (i.e. addiction risk, apnea etc) and complications (i.e. Somnolence, constipation and others) were explained to the patient and were aknowledged.

## 2023-01-03 LAB — COMPREHENSIVE METABOLIC PANEL
ALT: 15 U/L (ref 0–53)
AST: 14 U/L (ref 0–37)
Albumin: 4.4 g/dL (ref 3.5–5.2)
Alkaline Phosphatase: 67 U/L (ref 39–117)
BUN: 22 mg/dL (ref 6–23)
CO2: 23 mEq/L (ref 19–32)
Calcium: 10.6 mg/dL — ABNORMAL HIGH (ref 8.4–10.5)
Chloride: 105 mEq/L (ref 96–112)
Creatinine, Ser: 1.26 mg/dL (ref 0.40–1.50)
GFR: 55.09 mL/min — ABNORMAL LOW (ref >60.00)
Glucose, Bld: 106 mg/dL — ABNORMAL HIGH (ref 70–99)
Potassium: 5.5 mEq/L — ABNORMAL HIGH (ref 3.5–5.1)
Sodium: 139 mEq/L (ref 135–145)
Total Bilirubin: 0.5 mg/dL (ref 0.2–1.2)
Total Protein: 7.4 g/dL (ref 6.0–8.3)

## 2023-01-03 NOTE — Telephone Encounter (Signed)
Pharmacy Patient Advocate Encounter  Prior Authorization for Oxycodone HCL (IR) 78m tab has been approved.    Effective dates: 01/02/2023 through 11/12/2023

## 2023-01-04 ENCOUNTER — Other Ambulatory Visit: Payer: Self-pay | Admitting: Internal Medicine

## 2023-01-04 DIAGNOSIS — I10 Essential (primary) hypertension: Secondary | ICD-10-CM

## 2023-01-09 ENCOUNTER — Ambulatory Visit: Payer: Medicare Other | Admitting: Internal Medicine

## 2023-01-10 ENCOUNTER — Other Ambulatory Visit (INDEPENDENT_AMBULATORY_CARE_PROVIDER_SITE_OTHER): Payer: Medicare PPO

## 2023-01-10 DIAGNOSIS — I10 Essential (primary) hypertension: Secondary | ICD-10-CM | POA: Diagnosis not present

## 2023-01-10 LAB — COMPREHENSIVE METABOLIC PANEL
ALT: 12 U/L (ref 0–53)
AST: 14 U/L (ref 0–37)
Albumin: 3.9 g/dL (ref 3.5–5.2)
Alkaline Phosphatase: 63 U/L (ref 39–117)
BUN: 15 mg/dL (ref 6–23)
CO2: 28 mEq/L (ref 19–32)
Calcium: 10.5 mg/dL (ref 8.4–10.5)
Chloride: 102 mEq/L (ref 96–112)
Creatinine, Ser: 1.12 mg/dL (ref 0.40–1.50)
GFR: 63.45 mL/min (ref >60.00)
Glucose, Bld: 86 mg/dL (ref 70–99)
Potassium: 4.9 mEq/L (ref 3.5–5.1)
Sodium: 139 mEq/L (ref 135–145)
Total Bilirubin: 0.5 mg/dL (ref 0.2–1.2)
Total Protein: 6.8 g/dL (ref 6.0–8.3)

## 2023-01-14 ENCOUNTER — Ambulatory Visit (INDEPENDENT_AMBULATORY_CARE_PROVIDER_SITE_OTHER): Payer: Medicare PPO

## 2023-01-14 VITALS — BP 108/71 | HR 89 | Ht 65.0 in | Wt 217.0 lb

## 2023-01-14 DIAGNOSIS — Z0000 Encounter for general adult medical examination without abnormal findings: Secondary | ICD-10-CM

## 2023-01-14 NOTE — Patient Instructions (Signed)
Cody Frazier , Thank you for taking time to come for your Medicare Wellness Visit. I appreciate your ongoing commitment to your health goals. Please review the following plan we discussed and let me know if I can assist you in the future.   These are the goals we discussed:  Goals      Client understands the importance of follow-up with providers by attending scheduled visits        This is a list of the screening recommended for you and due dates:  Health Maintenance  Topic Date Due   Complete foot exam   Never done   Eye exam for diabetics  Never done   Yearly kidney health urinalysis for diabetes  Never done   Hepatitis C Screening: USPSTF Recommendation to screen - Ages 53-79 yo.  Never done   Zoster (Shingles) Vaccine (1 of 2) Never done   COVID-19 Vaccine (6 - 2023-24 season) 06/28/2023*   Hemoglobin A1C  07/03/2023   Yearly kidney function blood test for diabetes  01/10/2024   Medicare Annual Wellness Visit  01/14/2024   Colon Cancer Screening  08/14/2025   DTaP/Tdap/Td vaccine (4 - Td or Tdap) 12/26/2028   Pneumonia Vaccine  Completed   Flu Shot  Completed   HPV Vaccine  Aged Out  *Topic was postponed. The date shown is not the original due date.    Advanced directives: Yes; needs updating per patient.  Conditions/risks identified: Yes  Next appointment: Follow up in one year for your annual wellness visit.   Preventive Care 4 Years and Older, Male  Preventive care refers to lifestyle choices and visits with your health care provider that can promote health and wellness. What does preventive care include? A yearly physical exam. This is also called an annual well check. Dental exams once or twice a year. Routine eye exams. Ask your health care provider how often you should have your eyes checked. Personal lifestyle choices, including: Daily care of your teeth and gums. Regular physical activity. Eating a healthy diet. Avoiding tobacco and drug use. Limiting  alcohol use. Practicing safe sex. Taking low doses of aspirin every day. Taking vitamin and mineral supplements as recommended by your health care provider. What happens during an annual well check? The services and screenings done by your health care provider during your annual well check will depend on your age, overall health, lifestyle risk factors, and family history of disease. Counseling  Your health care provider may ask you questions about your: Alcohol use. Tobacco use. Drug use. Emotional well-being. Home and relationship well-being. Sexual activity. Eating habits. History of falls. Memory and ability to understand (cognition). Work and work Statistician. Screening  You may have the following tests or measurements: Height, weight, and BMI. Blood pressure. Lipid and cholesterol levels. These may be checked every 5 years, or more frequently if you are over 32 years old. Skin check. Lung cancer screening. You may have this screening every year starting at age 74 if you have a 30-pack-year history of smoking and currently smoke or have quit within the past 15 years. Fecal occult blood test (FOBT) of the stool. You may have this test every year starting at age 28. Flexible sigmoidoscopy or colonoscopy. You may have a sigmoidoscopy every 5 years or a colonoscopy every 10 years starting at age 24. Prostate cancer screening. Recommendations will vary depending on your family history and other risks. Hepatitis C blood test. Hepatitis B blood test. Sexually transmitted disease (STD) testing. Diabetes screening.  This is done by checking your blood sugar (glucose) after you have not eaten for a while (fasting). You may have this done every 1-3 years. Abdominal aortic aneurysm (AAA) screening. You may need this if you are a current or former smoker. Osteoporosis. You may be screened starting at age 38 if you are at high risk. Talk with your health care provider about your test results,  treatment options, and if necessary, the need for more tests. Vaccines  Your health care provider may recommend certain vaccines, such as: Influenza vaccine. This is recommended every year. Tetanus, diphtheria, and acellular pertussis (Tdap, Td) vaccine. You may need a Td booster every 10 years. Zoster vaccine. You may need this after age 35. Pneumococcal 13-valent conjugate (PCV13) vaccine. One dose is recommended after age 44. Pneumococcal polysaccharide (PPSV23) vaccine. One dose is recommended after age 64. Talk to your health care provider about which screenings and vaccines you need and how often you need them. This information is not intended to replace advice given to you by your health care provider. Make sure you discuss any questions you have with your health care provider. Document Released: 11/25/2015 Document Revised: 07/18/2016 Document Reviewed: 08/30/2015 Elsevier Interactive Patient Education  2017 [place] Prevention in the Home Falls can cause injuries. They can happen to people of all ages. There are many things you can do to make your home safe and to help prevent falls. What can I do on the outside of my home? Regularly fix the edges of walkways and driveways and fix any cracks. Remove anything that might make you trip as you walk through a door, such as a raised step or threshold. Trim any bushes or trees on the path to your home. Use bright outdoor lighting. Clear any walking paths of anything that might make someone trip, such as rocks or tools. Regularly check to see if handrails are loose or broken. Make sure that both sides of any steps have handrails. Any raised decks and porches should have guardrails on the edges. Have any leaves, snow, or ice cleared regularly. Use sand or salt on walking paths during winter. Clean up any spills in your garage right away. This includes oil or grease spills. What can I do in the bathroom? Use night  lights. Install grab bars by the toilet and in the tub and shower. Do not use towel bars as grab bars. Use non-skid mats or decals in the tub or shower. If you need to sit down in the shower, use a plastic, non-slip stool. Keep the floor dry. Clean up any water that spills on the floor as soon as it happens. Remove soap buildup in the tub or shower regularly. Attach bath mats securely with double-sided non-slip rug tape. Do not have throw rugs and other things on the floor that can make you trip. What can I do in the bedroom? Use night lights. Make sure that you have a light by your bed that is easy to reach. Do not use any sheets or blankets that are too big for your bed. They should not hang down onto the floor. Have a firm chair that has side arms. You can use this for support while you get dressed. Do not have throw rugs and other things on the floor that can make you trip. What can I do in the kitchen? Clean up any spills right away. Avoid walking on wet floors. Keep items that you use a lot in easy-to-reach places. If  you need to reach something above you, use a strong step stool that has a grab bar. Keep electrical cords out of the way. Do not use floor polish or wax that makes floors slippery. If you must use wax, use non-skid floor wax. Do not have throw rugs and other things on the floor that can make you trip. What can I do with my stairs? Do not leave any items on the stairs. Make sure that there are handrails on both sides of the stairs and use them. Fix handrails that are broken or loose. Make sure that handrails are as long as the stairways. Check any carpeting to make sure that it is firmly attached to the stairs. Fix any carpet that is loose or worn. Avoid having throw rugs at the top or bottom of the stairs. If you do have throw rugs, attach them to the floor with carpet tape. Make sure that you have a light switch at the top of the stairs and the bottom of the stairs. If  you do not have them, ask someone to add them for you. What else can I do to help prevent falls? Wear shoes that: Do not have high heels. Have rubber bottoms. Are comfortable and fit you well. Are closed at the toe. Do not wear sandals. If you use a stepladder: Make sure that it is fully opened. Do not climb a closed stepladder. Make sure that both sides of the stepladder are locked into place. Ask someone to hold it for you, if possible. Clearly mark and make sure that you can see: Any grab bars or handrails. First and last steps. Where the edge of each step is. Use tools that help you move around (mobility aids) if they are needed. These include: Canes. Walkers. Scooters. Crutches. Turn on the lights when you go into a dark area. Replace any light bulbs as soon as they burn out. Set up your furniture so you have a clear path. Avoid moving your furniture around. If any of your floors are uneven, fix them. If there are any pets around you, be aware of where they are. Review your medicines with your doctor. Some medicines can make you feel dizzy. This can increase your chance of falling. Ask your doctor what other things that you can do to help prevent falls. This information is not intended to replace advice given to you by your health care provider. Make sure you discuss any questions you have with your health care provider. Document Released: 08/25/2009 Document Revised: 04/05/2016 Document Reviewed: 12/03/2014 Elsevier Interactive Patient Education  2017 [employer].

## 2023-01-14 NOTE — Progress Notes (Signed)
I connected with  Cody Frazier on 01/14/2023 at 2:00 p.m. EST by telephone and verified that I am speaking with the correct person using two identifiers.  Location: Patient: Home Provider: [place] Persons participating in the virtual visit: Cody Frazier   I discussed the limitations, risks, security and privacy concerns of performing an evaluation and management service by telephone and the availability of in person appointments. The patient expressed understanding and agreed to proceed.  Interactive audio and video telecommunications were attempted between this nurse and patient, however failed, due to patient having technical difficulties OR patient did not have access to video capability.  We continued and completed visit with audio only.  Some vital signs may be absent or patient reported.   Cody Flow, LPN  Subjective:   Cody Frazier is a 77 y.o. male who presents for Medicare Annual/Subsequent preventive examination.  Review of Systems     Cardiac Risk Factors include: advanced age (>50mn, >>12women);dyslipidemia;family history of premature cardiovascular disease;hypertension;male gender;obesity (BMI >30kg/m2)     Objective:    Today's Vitals   01/14/23 1410  Weight: 217 lb (98.4 kg)  Height: '5\' 5"'$  (1.651 m)  PainSc: 4   PainLoc: Back   Body mass index is 36.11 kg/m.     01/14/2023    2:30 PM 05/12/2022   11:36 PM 12/25/2021    1:55 PM 12/21/2020   11:06 AM 08/14/2020    9:11 AM 08/13/2020   12:00 AM 08/12/2020    1:33 PM  Advanced Directives  Does Patient Have a Medical Advance Directive? Yes No Yes Yes Yes Yes Yes  Type of [place]  Living will;Healthcare Power of Attorney Living will;Healthcare Power of [place]   Does patient want to make changes to medical advance directive?   No - Patient declined No - Patient declined  No - Patient declined    Copy of [place] Chart? No - copy requested  No - copy requested   No - copy requested   Would patient like information on creating a medical advance directive?  No - Patient declined         Current Medications (verified) Outpatient Encounter Medications as of 01/14/2023  Medication Sig   acetaminophen (TYLENOL) 500 MG tablet Take 1,000 mg by mouth every 6 (six) hours as needed for mild pain or moderate pain. Maximum daily dose of 3g   atorvastatin (LIPITOR) 20 MG tablet TAKE 1 TABLET BY MOUTH EVERY DAY   cetirizine (ZYRTEC) 10 MG tablet Take 10 mg by mouth daily.   Cholecalciferol (VITAMIN D3) 1.25 MG (50000 UT) CAPS TAKE 1 CAPSULE BY MOUTH MONTHLY   ELIQUIS 2.5 MG TABS tablet TAKE 1 TABLET BY MOUTH TWICE A DAY   famotidine (PEPCID) 40 MG tablet Take 1 tablet (40 mg total) by mouth at bedtime.   fluticasone (FLONASE) 50 MCG/ACT nasal spray SPRAY 2 SPRAYS INTO EACH NOSTRIL EVERY DAY   furosemide (LASIX) 40 MG tablet Take 1 tablet (40 mg total) by mouth daily as needed for edema. TAKE '40MG'$  DAILY x3 DAYS THEN TAKE '40MG'$  EVERY OTHER DAY THEN BACK TO PRN   hyoscyamine (LEVSIN SL) 0.125 MG SL tablet Place 1-2 tablets (0.125-0.25 mg total) under the tongue every 4 (four) hours as needed for cramping.   latanoprost (XALATAN) 0.005 % ophthalmic solution Place 1 drop into both eyes at bedtime.   metoprolol succinate (TOPROL-XL) 25 MG  24 hr tablet TAKE 1 TABLET BY MOUTH EVERY DAY (Patient taking differently: Take 25 mg by mouth daily.)   oxyCODONE (ROXICODONE) 15 MG immediate release tablet Take 1 tablet (15 mg total) by mouth every 4 (four) hours as needed for pain.   oxyCODONE (ROXICODONE) 15 MG immediate release tablet Take 1 tablet (15 mg total) by mouth 4 (four) times daily as needed for pain.   oxyCODONE (ROXICODONE) 15 MG immediate release tablet Take 1 tablet (15 mg total) by mouth 4 (four) times daily as needed for pain.   pantoprazole (PROTONIX) 40 MG tablet Take 1 tablet  (40 mg total) by mouth 2 (two) times daily.   RYBELSUS 14 MG TABS TAKE 1 TABLET (14 MG TOTAL) BY MOUTH DAILY   Semaglutide (RYBELSUS) 7 MG TABS Take 1 tablet by mouth daily. Take 30 min ac   spironolactone (ALDACTONE) 25 MG tablet TAKE 1 TABLET BY MOUTH EVERY DAY   telmisartan (MICARDIS) 20 MG tablet TAKE 1 TABLET BY MOUTH EVERY DAY   timolol (TIMOPTIC) 0.25 % ophthalmic solution Place 1 drop into both eyes 2 (two) times daily.    triamcinolone cream (KENALOG) 0.1 % Apply 1 application topically 3 (three) times daily.   No facility-administered encounter medications on file as of 01/14/2023.    Allergies (verified) Codeine phosphate, Nsaids, and Adhesive [tape]   History: Past Medical History:  Diagnosis Date   Allergic rhinitis    Anemia, iron deficiency    Arthritis    Atrial arrhythmia    Atrial fib/flutter, transient    following prior surgeries x 2   Barrett's esophagus without dysplasia    BPH (benign prostatic hyperplasia)    Bright's disease    Chronic LBP    Closed fracture of right distal radius    DDD (degenerative disc disease)    Diverticulosis of colon    Duodenal stricture    Gastric outlet obstruction    GERD (gastroesophageal reflux disease)    Glaucoma    Hemorrhoids    History of kidney stones    Hyperlipidemia    Hyperplastic colonic polyp 01/2000   Hypertension    Nephrolithiasis    hx of B   Peptic ulcer disease with hemorrhage 08/2008   and [facility]. Pylori Ab negative   Renal cyst    Sleep apnea with use of continuous positive airway pressure (CPAP)    Tubular adenoma of colon 03/2012   Wears glasses    Past Surgical History:  Procedure Laterality Date   APPENDECTOMY     BACK SURGERY  02/2003   L5   BILROTH I PROCEDURE  2011   Dr Cody Frazier   CLOSED REDUCTION WRIST FRACTURE Right 01/06/2019   Procedure: Closed Reduction Wrist/Pinning Wrist;  Surgeon: Cody Barker, MD;  Location: [facility];  Service: Plastics;  Laterality: Right;   COLONOSCOPY      COLONOSCOPY WITH PROPOFOL N/A 08/14/2020   Procedure: COLONOSCOPY WITH PROPOFOL;  Surgeon: Cody Banister, MD;  Location[facility];  Service: Endoscopy;  Laterality: N/A;   HIATAL HERNIA REPAIR     LAPAROTOMY N/A 05/10/2017   Procedure: EXPLORATORY LAPAROTOMY WITH ENTEROLYSIS;  Surgeon: Cody Hausen, MD;  Location: [facility];  Service: General;  Laterality: N/A;   LUMBAR FUSION  2009   SMALL INTESTINE SURGERY     TONSILLECTOMY     UPPER GASTROINTESTINAL ENDOSCOPY  07/26/2020   Family History  Problem Relation Age of Onset   Colon polyps Mother    Colon cancer Mother 11  Heart disease Mother    Colon polyps Father    Colon cancer Father 74   Prostate cancer Father    Stroke Father    Colon cancer Paternal Uncle 36   Colon cancer Paternal Uncle 17   Hypertension Other    Colon cancer Cousin    Drug abuse Sister        overdose   Stomach cancer Neg Hx    Esophageal cancer Neg Hx    Pancreatic cancer Neg Hx    Liver disease Neg Hx    Social History   Socioeconomic History   Marital status: Married    Spouse name: Arif Stoltman   Number of children: 1   Years of education: Not on file   Highest education level: Not on file  Occupational History   Occupation: PASTOR    Employer: [employer]  Tobacco Use   Smoking status: Former   Smokeless tobacco: Never   Tobacco comments:    as a teenager  Vaping Use   Vaping Use: Never used  Substance and Sexual Activity   Alcohol use: No   Drug use: No   Sexual activity: Yes  Other Topics Concern   Not on file  Social History Narrative   Patient gets regular exercise   Married x 42 years   Social Determinants of Health   Financial Resource Strain: Low Risk  (01/14/2023)   Overall Financial Resource Strain (CARDIA)    Difficulty of Paying Living Expenses: Not hard at all  Food Insecurity: No Food Insecurity (01/14/2023)   Hunger Vital Sign    Worried About Running Out of Food in the Last Year: Never true     [place] in the Last Year: Never true  Transportation Needs: No Transportation Needs (01/14/2023)   PRAPARE - Hydrologist (Medical): No    Lack of Transportation (Non-Medical): No  Physical Activity: Inactive (01/14/2023)   Exercise Vital Sign    Days of Exercise per Week: 0 days    Minutes of Exercise per Session: 0 min  Stress: No Stress Concern Present (01/14/2023)   [place] of Stress : Not at all  Social Connections: [place] (01/14/2023)   Social Connection and Isolation Panel [NHANES]    Frequency of Communication with Friends and Family: More than three times a week    Frequency of Social Gatherings with Friends and Family: More than three times a week    Attends Religious Services: More than 4 times per year    Active Member of [employer] or Organizations: Yes    Attends Music therapist: More than 4 times per year    Marital Status: Married    Tobacco Counseling Counseling given: Not Answered Tobacco comments: as a teenager   Clinical Intake:  Pre-visit preparation completed: Yes  Pain : No/denies pain Pain Score: 4      BMI - recorded: 36.11 Nutritional Status: BMI > 30  Obese Nutritional Risks: None Diabetes: No  How often do you need to have someone help you when you read instructions, pamphlets, or other written materials from your doctor or pharmacy?: 1 - Never What is the last grade level you completed in school?: [place]  Diabetic? Prediabetes  Interpreter Needed?: No  Information entered by :: Lisette Abu, LPN.   Activities of Daily Living    01/14/2023    2:29 PM  In your present  state of health, do you have any difficulty performing the following activities:  Hearing? 1  Vision? 0  Difficulty concentrating or making decisions? 0  Walking or climbing stairs? 1  Dressing or bathing? 0  Doing errands, shopping? 0   Preparing Food and eating ? N  Using the Toilet? N  In the past six months, have you accidently leaked urine? N  Do you have problems with loss of bowel control? N  Managing your Medications? N  Managing your Finances? N  Housekeeping or managing your Housekeeping? N    Patient Care Team: Plotnikov, Evie Lacks, MD as PCP - General Croitoru, Dani Gobble, MD as PCP - Cardiology (Cardiology) Leeroy Cha, MD (Neurosurgery) Michael Boston, MD (General Surgery) Ladene Artist, MD (Gastroenterology) Franchot Gallo, MD as Attending Physician (Urology) Jacolyn Reedy, MD as Consulting Physician (Cardiology) [place], [facility] as Consulting Physician (Ophthalmology) Delice Bison, Darnelle Maffucci, [place] (Inactive) as Pharmacist (Pharmacist) Vallarie Mare, MD as Consulting Physician (Neurosurgery)  Indicate any recent Medical Services you may have received from other than [facility] providers in the past year (date may be approximate).     Assessment:   This is a routine wellness examination for Cody Frazier.  Hearing/Vision screen Hearing Screening - Comments:: Patient wears hearing aids. Vision Screening - Comments:: Wears rx glasses - up to date with routine eye exams with Helmut Muster, MD.   Dietary issues and exercise activities discussed: Current Exercise Habits: The patient does not participate in regular exercise at present, Exercise limited by: orthopedic condition(s);cardiac condition(s)   Goals Addressed             This Visit's Progress    Client understands the importance of follow-up with providers by attending scheduled visits        Depression Screen    01/02/2023    3:33 PM 10/09/2022    1:43 PM 01/11/2022    2:04 PM 12/25/2021    1:54 PM 12/21/2020   11:05 AM 09/12/2020    1:43 PM 02/22/2020    2:12 PM  PHQ 2/9 Scores  PHQ - 2 Score 0 0 0 0 0 0 0    Fall Risk    01/02/2023    3:33 PM 10/09/2022    1:43 PM 01/11/2022    2:04 PM 12/25/2021    1:57 PM 12/21/2020   11:06 AM   Fall Risk   Falls in the past year? 0 0 0 1 0  Number falls in past yr: 0 0 0 1 0  Injury with Fall? 0 0 0 0 0  Risk for fall due to : No Fall Risks No Fall Risks  Impaired balance/gait;Other (Comment) Impaired balance/gait  Risk for fall due to: Comment    Neurosurgical patient   Follow up Falls evaluation completed Falls evaluation completed  Falls evaluation completed     [place]:  Any stairs in or around the home? Yes  If so, are there any without handrails? No  Home free of loose throw rugs in walkways, pet beds, electrical cords, etc? Yes  Adequate lighting in your home to reduce risk of falls? Yes   ASSISTIVE DEVICES UTILIZED TO PREVENT FALLS:  Life alert? No  Use of a cane, walker or w/c? Yes  Grab bars in the bathroom? Yes  Shower chair or bench in shower? Yes  Elevated toilet seat or a handicapped toilet? Yes   TIMED UP AND GO:  Was the test performed? No . Telephonic Visit  Cognitive Function:    02/22/2017    1:25 PM  MMSE - Mini Mental State Exam  Orientation to time 5  Orientation to Place 5  Registration 3  Attention/ Calculation 3  Recall 3  Language- name 2 objects 2  Language- repeat 1  Language- follow 3 step command 3  Language- read & follow direction 1  Write a sentence 1  Copy design 1  Total score 28        01/14/2023    2:30 PM  6CIT Screen  What Year? 0 points  What month? 0 points  What time? 0 points  Count back from 20 0 points  Months in reverse 0 points  Repeat phrase 0 points  Total Score 0 points    Immunizations Immunization History  Administered Date(s) Administered   DT (Pediatric) 02/09/2011   Fluad Quad(high Dose 65+) 08/14/2020   Influenza Split 08/13/2011, 08/19/2012   Influenza Whole 09/26/2009, 08/01/2010   Influenza, High Dose Seasonal PF 08/25/2013, 10/04/2015, 08/22/2016, 08/23/2017, 08/19/2018, 07/27/2019, 08/07/2022   Influenza,inj,Quad PF,6+ Mos 08/25/2014    Influenza-Unspecified 08/12/2021   PFIZER Comirnaty(Gray Top)Covid-19 Tri-Sucrose Vaccine 08/07/2022   PFIZER(Purple Top)SARS-COV-2 Vaccination 12/09/2019, 12/30/2019, 08/28/2020, 08/12/2021   Pneumococcal Conjugate-13 11/22/2014   Pneumococcal Polysaccharide-23 11/19/2011   Td 05/26/2015   Tdap 12/26/2018   Zoster, Live 02/17/2008    TDAP status: Up to date  Flu Vaccine status: Up to date  Pneumococcal vaccine status: Up to date  Covid-19 vaccine status: Completed vaccines  Qualifies for Shingles Vaccine? Yes   Zostavax completed Yes   Shingrix Completed?: No.    Education has been provided regarding the importance of this vaccine. Patient has been advised to call insurance company to determine out of pocket expense if they have not yet received this vaccine. Advised may also receive vaccine at local pharmacy or Health Dept. Verbalized acceptance and understanding.  Screening Tests Health Maintenance  Topic Date Due   FOOT EXAM  Never done   OPHTHALMOLOGY EXAM  Never done   Diabetic kidney evaluation - Urine ACR  Never done   Hepatitis C Screening  Never done   Zoster Vaccines- Shingrix (1 of 2) Never done   COVID-19 Vaccine (6 - 2023-24 season) 06/28/2023 (Originally 10/02/2022)   HEMOGLOBIN A1C  07/03/2023   Diabetic kidney evaluation - eGFR measurement  01/10/2024   Medicare Annual Wellness (AWV)  01/14/2024   COLONOSCOPY (Pts 45-64yr Insurance coverage will need to be confirmed)  08/14/2025   DTaP/Tdap/Td (4 - Td or Tdap) 12/26/2028   Pneumonia Vaccine 77 Years old  Completed   INFLUENZA VACCINE  Completed   HPV VACCINES  Aged Out    Health Maintenance  Health Maintenance Due  Topic Date Due   FOOT EXAM  Never done   OPHTHALMOLOGY EXAM  Never done   Diabetic kidney evaluation - Urine ACR  Never done   Hepatitis C Screening  Never done   Zoster Vaccines- Shingrix (1 of 2) Never done    Colorectal cancer screening: Type of screening: Colonoscopy. Completed  08/14/2020. Repeat every 5 years  Lung Cancer Screening: (Low Dose CT Chest recommended if Age 78-80years, 30 pack-year currently smoking OR have quit w/in 15years.) does not qualify.   Lung Cancer Screening Referral: no  Additional Screening:  Hepatitis C Screening: does qualify; Completed no  Vision Screening: Recommended annual ophthalmology exams for early detection of glaucoma and other disorders of the eye. Is the patient up to date with their annual eye exam?  Yes  Who is the provider or what is the name of the office in which the patient attends annual eye exams? Helmut Muster, MD. If pt is not established with a provider, would they like to be referred to a provider to establish care? No .   Dental Screening: Recommended annual dental exams for proper oral hygiene  Community Resource Referral / Chronic Care Management: CRR required this visit?  No   CCM required this visit?  No      Plan:     I have personally reviewed and noted the following in the patient's chart:   Medical and social history Use of alcohol, tobacco or illicit drugs  Current medications and supplements including opioid prescriptions. Patient is currently taking opioid prescriptions. Information provided to patient regarding non-opioid alternatives. Patient advised to discuss non-opioid treatment plan with their provider. Functional ability and status Nutritional status Physical activity Advanced directives List of other physicians Hospitalizations, surgeries, and ER visits in previous 12 months Vitals Screenings to include cognitive, depression, and falls Referrals and appointments  In addition, I have reviewed and discussed with patient certain preventive protocols, quality metrics, and best practice recommendations. A written personalized care plan for preventive services as well as general preventive health recommendations were provided to patient.     Cody Flow, LPN   [id number]    Nurse Notes:  Normal cognitive status assessed by direct observation by this Nurse Health Advisor. No abnormalities found.

## 2023-01-15 ENCOUNTER — Telehealth: Payer: Self-pay

## 2023-01-15 NOTE — Telephone Encounter (Signed)
Patient c/o the following symptoms since increasing his Rylbelsus to '14mg'$ : exhaustion, stomach upset, pale skin, low appetite and more nausea.  Patient does have a follow-up appt with Dr. Alain Marion next week.  Gemma Ruan N. Videl Nobrega, LPN. [place] Team Direct Dial: [phone]

## 2023-01-16 NOTE — Telephone Encounter (Signed)
Notified pt w/MD response.../lmb 

## 2023-01-16 NOTE — Telephone Encounter (Signed)
The patient can take one half of the 14 mg Rybelsus tablet daily (7.5 mg daily).  Thank you

## 2023-01-28 ENCOUNTER — Other Ambulatory Visit: Payer: Self-pay | Admitting: Internal Medicine

## 2023-01-28 ENCOUNTER — Ambulatory Visit (INDEPENDENT_AMBULATORY_CARE_PROVIDER_SITE_OTHER): Payer: Medicare PPO | Admitting: Internal Medicine

## 2023-01-28 ENCOUNTER — Encounter: Payer: Self-pay | Admitting: Internal Medicine

## 2023-01-28 VITALS — BP 122/82 | HR 81 | Temp 98.6°F | Ht 65.0 in | Wt 218.0 lb

## 2023-01-28 DIAGNOSIS — M5441 Lumbago with sciatica, right side: Secondary | ICD-10-CM

## 2023-01-28 DIAGNOSIS — E1169 Type 2 diabetes mellitus with other specified complication: Secondary | ICD-10-CM | POA: Diagnosis not present

## 2023-01-28 DIAGNOSIS — D492 Neoplasm of unspecified behavior of bone, soft tissue, and skin: Secondary | ICD-10-CM

## 2023-01-28 DIAGNOSIS — G8929 Other chronic pain: Secondary | ICD-10-CM

## 2023-01-28 DIAGNOSIS — Z6836 Body mass index (BMI) 36.0-36.9, adult: Secondary | ICD-10-CM

## 2023-01-28 DIAGNOSIS — E669 Obesity, unspecified: Secondary | ICD-10-CM

## 2023-01-28 DIAGNOSIS — M5442 Lumbago with sciatica, left side: Secondary | ICD-10-CM

## 2023-01-28 DIAGNOSIS — D485 Neoplasm of uncertain behavior of skin: Secondary | ICD-10-CM

## 2023-01-28 DIAGNOSIS — L820 Inflamed seborrheic keratosis: Secondary | ICD-10-CM | POA: Diagnosis not present

## 2023-01-28 NOTE — Assessment & Plan Note (Signed)
3 lesions on the left temple.  Please see shave biopsy/removal

## 2023-01-28 NOTE — Progress Notes (Signed)
Subjective:  Patient ID: Cody Frazier, male    DOB: 21-Jun-1946  Age: 77 y.o. MRN: [id number]  CC: Biopsy (Left Temple Skin Biopsy)   HPI AHMAR SIEG presents for a skin procedure. The patient is concerned about his Rybelsus.  He started to have upset stomach on 40 mg a day. He is also complaining about his low back pain. She was suggested to have a pain stimulator installed.  He has his doubts.  Outpatient Medications Prior to Visit  Medication Sig Dispense Refill   acetaminophen (TYLENOL) 500 MG tablet Take 1,000 mg by mouth every 6 (six) hours as needed for mild pain or moderate pain. Maximum daily dose of 3g     atorvastatin (LIPITOR) 20 MG tablet TAKE 1 TABLET BY MOUTH EVERY DAY 90 tablet 3   cetirizine (ZYRTEC) 10 MG tablet Take 10 mg by mouth daily.     Cholecalciferol (VITAMIN D3) 1.25 MG (50000 UT) CAPS TAKE 1 CAPSULE BY MOUTH MONTHLY 3 capsule 3   ELIQUIS 2.5 MG TABS tablet TAKE 1 TABLET BY MOUTH TWICE A DAY 180 tablet 3   famotidine (PEPCID) 40 MG tablet Take 1 tablet (40 mg total) by mouth at bedtime. 30 tablet 11   fluticasone (FLONASE) 50 MCG/ACT nasal spray SPRAY 2 SPRAYS INTO EACH NOSTRIL EVERY DAY 48 mL 3   furosemide (LASIX) 40 MG tablet Take 1 tablet (40 mg total) by mouth daily as needed for edema. TAKE 40MG  DAILY x3 DAYS THEN TAKE 40MG  EVERY OTHER DAY THEN BACK TO PRN 45 tablet 12   hyoscyamine (LEVSIN SL) 0.125 MG SL tablet Place 1-2 tablets (0.125-0.25 mg total) under the tongue every 4 (four) hours as needed for cramping. 120 tablet 1   latanoprost (XALATAN) 0.005 % ophthalmic solution Place 1 drop into both eyes at bedtime.  11   metoprolol succinate (TOPROL-XL) 25 MG 24 hr tablet TAKE 1 TABLET BY MOUTH EVERY DAY (Patient taking differently: Take 25 mg by mouth daily.) 90 tablet 3   oxyCODONE (ROXICODONE) 15 MG immediate release tablet Take 1 tablet (15 mg total) by mouth every 4 (four) hours as needed for pain. 120 tablet 0   oxyCODONE (ROXICODONE) 15 MG  immediate release tablet Take 1 tablet (15 mg total) by mouth 4 (four) times daily as needed for pain. 120 tablet 0   oxyCODONE (ROXICODONE) 15 MG immediate release tablet Take 1 tablet (15 mg total) by mouth 4 (four) times daily as needed for pain. 120 tablet 0   pantoprazole (PROTONIX) 40 MG tablet Take 1 tablet (40 mg total) by mouth 2 (two) times daily. 60 tablet 11   spironolactone (ALDACTONE) 25 MG tablet TAKE 1 TABLET BY MOUTH EVERY DAY 90 tablet 3   telmisartan (MICARDIS) 20 MG tablet TAKE 1 TABLET BY MOUTH EVERY DAY 90 tablet 3   timolol (TIMOPTIC) 0.25 % ophthalmic solution Place 1 drop into both eyes 2 (two) times daily.      triamcinolone cream (KENALOG) 0.1 % Apply 1 application topically 3 (three) times daily. 80 g 3   RYBELSUS 14 MG TABS TAKE 1 TABLET (14 MG TOTAL) BY MOUTH DAILY (Patient not taking: Reported on 01/28/2023) 90 tablet 3   Semaglutide (RYBELSUS) 7 MG TABS Take 1 tablet by mouth daily. Take 30 min ac (Patient not taking: Reported on 01/28/2023) 30 tablet 3   No facility-administered medications prior to visit.    ROS: Review of Systems  Constitutional:  Positive for fatigue. Negative for appetite change and  unexpected weight change.  HENT:  Negative for congestion, nosebleeds, sneezing, sore throat and trouble swallowing.   Eyes:  Negative for itching and visual disturbance.  Respiratory:  Negative for cough.   Cardiovascular:  Negative for chest pain, palpitations and leg swelling.  Gastrointestinal:  Negative for abdominal distention, blood in stool, diarrhea and nausea.  Genitourinary:  Negative for frequency and hematuria.  Musculoskeletal:  Positive for back pain and gait problem. Negative for joint swelling and neck pain.  Skin:  Positive for color change. Negative for rash.  Neurological:  Negative for dizziness, tremors, speech difficulty and weakness.  Psychiatric/Behavioral:  Negative for agitation, dysphoric mood and sleep disturbance. The patient is not  nervous/anxious.     Objective:  BP 122/82 (BP Location: Left Arm, Patient Position: Sitting, Cuff Size: Large)   Pulse 81   Temp 98.6 F (37 C) (Oral)   Ht 5\' 5"  (1.651 m)   Wt 218 lb (98.9 kg)   SpO2 97%   BMI 36.28 kg/m   BP Readings from Last 3 Encounters:  01/28/23 122/82  01/14/23 108/71  01/02/23 118/78    Wt Readings from Last 3 Encounters:  01/28/23 218 lb (98.9 kg)  01/14/23 217 lb (98.4 kg)  01/02/23 218 lb (98.9 kg)    Physical Exam Constitutional:      General: He is not in acute distress.    Appearance: He is well-developed. He is obese.     Comments: NAD  Eyes:     Conjunctiva/sclera: Conjunctivae normal.     Pupils: Pupils are equal, round, and reactive to light.  Neck:     Thyroid: No thyromegaly.     Vascular: No JVD.  Cardiovascular:     Rate and Rhythm: Normal rate and regular rhythm.     Heart sounds: Normal heart sounds. No murmur heard.    No friction rub. No gallop.  Pulmonary:     Effort: Pulmonary effort is normal. No respiratory distress.     Breath sounds: Normal breath sounds. No wheezing or rales.  Chest:     Chest wall: No tenderness.  Abdominal:     General: Bowel sounds are normal. There is no distension.     Palpations: Abdomen is soft. There is no mass.     Tenderness: There is no abdominal tenderness. There is no guarding or rebound.  Musculoskeletal:        General: Tenderness present. Normal range of motion.     Cervical back: Normal range of motion.  Lymphadenopathy:     Cervical: No cervical adenopathy.  Skin:    General: Skin is warm and dry.     Findings: No rash.  Neurological:     Mental Status: He is alert and oriented to person, place, and time.     Cranial Nerves: No cranial nerve deficit.     Motor: No abnormal muscle tone.     Coordination: Coordination abnormal.     Gait: Gait abnormal.     Deep Tendon Reflexes: Reflexes are normal and symmetric.  Psychiatric:        Behavior: Behavior normal.         Thought Content: Thought content normal.        Judgment: Judgment normal.   Using a cane.  Lumbar spine painful to palpation Pigmented lesions on the left temple x 3    Procedure Note :     Procedure :  Skin biopsy   Indication:  Changing mole (s ),  Suspicious lesion(s)  Risks including unsuccessful procedure , bleeding, infection, bruising, scar, a need for another complete procedure and others were explained to the patient in detail as well as the benefits. Informed consent was obtained verbally.  The patient was placed in a decubitus position.  Lesion #1 on  L temple  measuring 21x18  mm   Skin over lesion #1  was prepped with Betadine and alcohol  and anesthetized with 1 cc of 2% lidocaine and epinephrine, using a 25-gauge 1 inch needle.  Partial shave biopsy with a sterile Dermablade was carried out in the usual fashion. Hyfrecator was used to destroy the rest of the lesion potentially left behind and for hemostasis. Band-Aid was applied with antibiotic ointment.    Lesion #2 on left temple    measuring 6 x 4 mm above #1   Skin over lesion #2  was prepped with Betadine and alcohol  and anesthetized with 1 cc of 2% lidocaine and epinephrine, using a 25-gauge 1 inch needle.  Shave biopsy with a sterile Dermablade was carried out in the usual fashion. Hyfrecator was used to destroy the rest of the lesion potentially left behind and for hemostasis. Band-Aid was applied with antibiotic ointment.  Lesion #3 on left temple   measuring 6 x 4 mm below #1   Skin over lesion #3  was prepped with Betadine and alcohol  and anesthetized with 1 cc of 2% lidocaine and epinephrine, using a 25-gauge 1 inch needle.  Shave biopsy with a sterile Dermablade was carried out in the usual fashion. Hyfrecator was used to destroy the rest of the lesion potentially left behind and for hemostasis. Band-Aid was applied with antibiotic ointment.   Tolerated well. Complications none.  Pathology specimen was  collected from a lesion #1 only.    Postprocedure instructions :    A Band-Aid should be  changed once or twice daily. You can take a shower tomorrow.  Keep the wounds clean. You can wash them with liquid soap and water. Pat dry with gauze or a Kleenex tissue before applying antibiotic ointment and a Band-Aid.   You need to report immediately  if fever, chills or any signs of infection develop.    The biopsy results should be available in 1 -2 weeks.  Lab Results  Component Value Date   WBC 9.1 05/13/2022   HGB 13.8 05/13/2022   HCT 40.4 05/13/2022   PLT 291 05/13/2022   GLUCOSE 86 01/10/2023   CHOL 196 11/20/2018   TRIG 269.0 (H) 11/20/2018   HDL 43.60 11/20/2018   LDLDIRECT 109.0 11/20/2018   LDLCALC 86 11/25/2017   ALT 12 01/10/2023   AST 14 01/10/2023   NA 139 01/10/2023   K 4.9 01/10/2023   CL 102 01/10/2023   CREATININE 1.12 01/10/2023   BUN 15 01/10/2023   CO2 28 01/10/2023   TSH 1.07 01/02/2023   PSA 0.16 11/20/2018   INR 1.1 08/12/2020   HGBA1C 5.8 01/02/2023    MR LUMBAR SPINE WO CONTRAST  Result Date: 06/04/2022 CLINICAL DATA:  Lumbar radiculopathy. Chronic mid and lower back pain with pain radiating into the shoulders. Prior lumbar surgery. EXAM: MRI LUMBAR SPINE WITHOUT CONTRAST TECHNIQUE: Multiplanar, multisequence MR imaging of the lumbar spine was performed. No intravenous contrast was administered. COMPARISON:  CTA chest, abdomen, and pelvis 05/13/2022. Lumbar myelogram 04/19/2008. FINDINGS: Segmentation:  Standard. Alignment: Thoracolumbar dextroscoliosis. Trace fused anterolisthesis of L5 on S1 and trace retrolisthesis of L1 on L2. Vertebrae: No fracture or suspicious marrow lesion. Prior  L2-S1 posterior fusion. Interbody spacers at L2-3, L3-4, and L4-5 with interbody osseous fusion at these levels as well as at L1-2 and L5-S1. Prominent degenerative endplate edema at QA348G as noted on today's separate thoracic spine MRI. Conus medullaris and cauda equina:  Conus extends to the L1-2 level. Conus and cauda equina appear normal. Paraspinal and other soft tissues: Bilateral renal cysts. Postoperative changes in the posterior lumbar soft tissues. Fluid collection in the laminectomy bed from L3-S1 without mass effect on the thecal sac. Disc levels: Lower thoracic spine reported separately. T12-L1: Disc desiccation and severe disc space narrowing. Mild disc bulging and facet arthrosis without significant stenosis. L1-2: Prior posterior decompression. Retrolisthesis and endplate spurring without significant stenosis. L2-3: Prior posterior decompression and fusion.  No stenosis. L3-4: Prior posterior decompression and fusion.  No stenosis. L4-5: Prior posterior decompression and fusion. Widely patent spinal canal and left neural foramen. The right L5 screw courses superior to the pedicle, traversing the inferior aspect of the right L4 neural foramen. L5-S1: Prior posterior decompression and fusion. Right eccentric disc bulging and endplate and facet spurring result in moderate right lateral recess and moderate right neural foraminal stenosis. No generalized spinal stenosis. Patent left neural foramen. IMPRESSION: 1. L2-S1 fusion. The right L5 screw traverses the inferior aspect of the right L4-5 neural foramen. 2. Moderate right lateral recess and right neural foraminal stenosis at L5-S1 due to spurring. Electronically Signed   By: Logan Bores M.D.   On: 06/04/2022 12:10   MR THORACIC SPINE WO CONTRAST  Result Date: 06/04/2022 CLINICAL DATA:  Lumbar radiculopathy. Chronic mid and lower back pain with pain radiating into the shoulders. Prior lumbar surgery. EXAM: MRI THORACIC SPINE WITHOUT CONTRAST TECHNIQUE: Multiplanar, multisequence MR imaging of the thoracic spine was performed. No intravenous contrast was administered. COMPARISON:  CTA chest, abdomen, and pelvis 05/13/2022. FINDINGS: Alignment: Increased upper thoracic kyphosis. Thoracolumbar dextroscoliosis. No  significant listhesis. Vertebrae: No fracture or suspicious marrow lesion. Prominent degenerative endplate edema at QA348G. Mild degenerative endplate edema at [id number] and minimal edema at T6-7. Cord:  Normal signal. Paraspinal and other soft tissues: Bilateral renal cysts. Disc levels: Incompletely evaluated disc degeneration in the cervical spine including at C5-6 where a disc osteophyte complex may contact and mildly deform the spinal cord. Small left paracentral disc protrusion at T2-3 without significant spinal cord mass effect or stenosis. Mild disc bulging in the midthoracic spine without stenosis. At T10-11, disc bulging, a right subarticular to foraminal disc protrusion, and asymmetrically severe right facet arthrosis result in mild spinal stenosis and severe right neural foraminal stenosis. At T11-12, disc bulging and facet arthrosis result in borderline spinal stenosis without neural foraminal stenosis. IMPRESSION: 1. Multilevel thoracic disc and facet degeneration, most notable at T10-11 where there is mild spinal stenosis and severe right neural foraminal stenosis. 2. Prominent degenerative endplate edema at QA348G. Electronically Signed   By: Logan Bores M.D.   On: 06/04/2022 11:43    Assessment & Plan:   Problem List Items Addressed This Visit       Endocrine   Diabetes mellitus type 2 in obese [facility])    Would like to continue Rybelsus 14 mg.  Continue with current dose.  If side effects, we can try to alternate 14 mg and 7 mg dose.  Pursue weight loss.        Musculoskeletal and Integument   Neoplasm of uncertain behavior of skin    3 lesions on the left temple.  Please see shave biopsy/removal  Other   Low back pain    Discussed.  I would not pursue weight loss and better diabetes control before we consider invasive procedures.  Cont on Oxycodone  Potential benefits of a long term opioids use as well as potential risks (i.e. addiction risk, apnea etc) and complications  (i.e. Somnolence, constipation and others) were explained to the patient and were aknowledged.      Other Visit Diagnoses     Skin neoplasm    -  Primary   Relevant Orders   Surgical pathology         No orders of the defined types were placed in this encounter.     Follow-up: No follow-ups on file.  Walker Kehr, MD

## 2023-01-28 NOTE — Assessment & Plan Note (Signed)
Discussed.  I would not pursue weight loss and better diabetes control before we consider invasive procedures.  Cont on Oxycodone  Potential benefits of a long term opioids use as well as potential risks (i.e. addiction risk, apnea etc) and complications (i.e. Somnolence, constipation and others) were explained to the patient and were aknowledged.

## 2023-01-28 NOTE — Assessment & Plan Note (Signed)
Would like to continue Rybelsus 14 mg.  Continue with current dose.  If side effects, we can try to alternate 14 mg and 7 mg dose.  Pursue weight loss.

## 2023-02-01 ENCOUNTER — Telehealth: Payer: Self-pay | Admitting: Cardiovascular Disease

## 2023-02-01 ENCOUNTER — Other Ambulatory Visit: Payer: Self-pay

## 2023-02-01 DIAGNOSIS — I5032 Chronic diastolic (congestive) heart failure: Secondary | ICD-10-CM

## 2023-02-01 NOTE — Progress Notes (Signed)
Lab orders placed per Dr Sallyanne Kuster

## 2023-02-01 NOTE — Telephone Encounter (Signed)
Pt c/o Shortness Of Breath: STAT if SOB developed within the last 24 hours or pt is noticeably SOB on the phone  1. Are you currently SOB (can you hear that pt is SOB on the phone)? This morning   2. How long have you been experiencing SOB? Several weeks  3. Are you SOB when sitting or when up moving around? Both  4. Are you currently experiencing any other symptoms? High HR, hypertension

## 2023-02-01 NOTE — Telephone Encounter (Signed)
Per Dr Sallyanne Kuster order CBC, BMET, BNP and briing in for first available appointment   Labs ordered and patient appt. For March 29th at 10:30am. Patient aware and will have labs drawn I our office on Monday

## 2023-02-01 NOTE — Telephone Encounter (Signed)
Looks like he has lost 10-15 lb since December so I do not know what the optimal "dry weight" is anymore. In the past, he had CHF exacerbation due to severe iron deficiency anemia. Please check CBC, BMET, BNP and briing in for first available appointment with me or APP. DOD book OK.

## 2023-02-01 NOTE — Telephone Encounter (Signed)
Patient states SOB for approx. 2-3 weeks. Slightly better today.  Swelling to Bilateral feet with Right ankle swelling as well.  SOB increases with activity and resolves with rest.  Had patient weigh while on phone he is at 219, and was 218 at appt. with PCP on the 18 th.  He has not taken morning meds nor the Lasix he has PRN for edema.  Advised to take morning medication including BP meds and take his dose of Lasix.  His BP is 148/81 HR 88 (before meds). Note that his BP yesterday before medication was 172/11 HR 95 and after medication it was 146/90  HR 92.  No other symptoms other than SOB with exertion.  No headache, no dizziness or lightheaded.

## 2023-02-05 LAB — CBC
Hematocrit: 39.4 % (ref 37.5–51.0)
Hemoglobin: 13.6 g/dL (ref 13.0–17.7)
MCH: 32.1 pg (ref 26.6–33.0)
MCHC: 34.5 g/dL (ref 31.5–35.7)
MCV: 93 fL (ref 79–97)
Platelets: 343 10*3/uL (ref 150–450)
RBC: 4.24 x10E6/uL (ref 4.14–5.80)
RDW: 12.4 % (ref 11.6–15.4)
WBC: 6 10*3/uL (ref 3.4–10.8)

## 2023-02-05 LAB — BASIC METABOLIC PANEL
BUN/Creatinine Ratio: 13 (ref 10–24)
BUN: 16 mg/dL (ref 8–27)
CO2: 21 mmol/L (ref 20–29)
Calcium: 10.3 mg/dL — ABNORMAL HIGH (ref 8.6–10.2)
Chloride: 104 mmol/L (ref 96–106)
Creatinine, Ser: 1.23 mg/dL (ref 0.76–1.27)
Glucose: 100 mg/dL — ABNORMAL HIGH (ref 70–99)
Potassium: 5.1 mmol/L (ref 3.5–5.2)
Sodium: 141 mmol/L (ref 134–144)
eGFR: 60 mL/min/{1.73_m2} (ref >59)

## 2023-02-05 LAB — BRAIN NATRIURETIC PEPTIDE: BNP: 55.8 pg/mL (ref 0.0–100.0)

## 2023-02-08 ENCOUNTER — Ambulatory Visit: Payer: Medicare PPO | Attending: Cardiovascular Disease | Admitting: Cardiovascular Disease

## 2023-02-08 ENCOUNTER — Encounter: Payer: Self-pay | Admitting: Cardiovascular Disease

## 2023-02-08 VITALS — BP 106/69 | HR 93 | Ht 65.0 in | Wt 214.2 lb

## 2023-02-08 DIAGNOSIS — I5032 Chronic diastolic (congestive) heart failure: Secondary | ICD-10-CM

## 2023-02-08 DIAGNOSIS — I700 Atherosclerosis of aorta: Secondary | ICD-10-CM

## 2023-02-08 DIAGNOSIS — Z862 Personal history of diseases of the blood and blood-forming organs and certain disorders involving the immune mechanism: Secondary | ICD-10-CM

## 2023-02-08 DIAGNOSIS — I480 Paroxysmal atrial fibrillation: Secondary | ICD-10-CM

## 2023-02-08 DIAGNOSIS — I2721 Secondary pulmonary arterial hypertension: Secondary | ICD-10-CM

## 2023-02-08 DIAGNOSIS — I10 Essential (primary) hypertension: Secondary | ICD-10-CM

## 2023-02-08 DIAGNOSIS — G4733 Obstructive sleep apnea (adult) (pediatric): Secondary | ICD-10-CM

## 2023-02-08 DIAGNOSIS — I2781 Cor pulmonale (chronic): Secondary | ICD-10-CM | POA: Diagnosis not present

## 2023-02-08 MED ORDER — FUROSEMIDE 40 MG PO TABS: 40.0000 mg | ORAL_TABLET | ORAL | 3 refills | Status: AC

## 2023-02-08 MED ORDER — TELMISARTAN 20 MG PO TABS: 10.0000 mg | ORAL_TABLET | Freq: Every day | ORAL | 3 refills | Status: DC
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
## 2023-02-08 NOTE — Patient Instructions (Signed)
Medication Instructions:  Decrease Telmisartan to 10mg  daily Take Furosemide 40mg  every other day. May take an extra dose if your weight is over 212 pounds.  *If you need a refill on your cardiac medications before your next appointment, please call your pharmacy*  Follow-Up: At [facility], you and your health needs are our priority.  As part of our continuing mission to provide you with exceptional heart care, we have created designated Provider Care Teams.  These Care Teams include your primary Cardiologist (physician) and Advanced Practice Providers (APPs -  Physician Assistants and Nurse Practitioners) who all work together to provide you with the care you need, when you need it.  We recommend signing up for the patient portal called "MyChart".  Sign up information is provided on this After Visit Summary.  MyChart is used to connect with patients for Virtual Visits (Telemedicine).  Patients are able to view lab/test results, encounter notes, upcoming appointments, etc.  Non-urgent messages can be sent to your provider as well.   To learn more about what you can do with MyChart, go to [email or web address].    Your next appointment:    Needs an appointment with Dr Claiborne Billings- sleep clinic  F/U with Dr Sallyanne Kuster in one year

## 2023-02-09 ENCOUNTER — Encounter: Payer: Self-pay | Admitting: Cardiovascular Disease

## 2023-02-11 ENCOUNTER — Ambulatory Visit (INDEPENDENT_AMBULATORY_CARE_PROVIDER_SITE_OTHER): Payer: Medicare PPO

## 2023-02-11 ENCOUNTER — Ambulatory Visit (INDEPENDENT_AMBULATORY_CARE_PROVIDER_SITE_OTHER): Payer: Medicare PPO | Admitting: Podiatry

## 2023-02-11 ENCOUNTER — Encounter: Payer: Self-pay | Admitting: Podiatry

## 2023-02-11 ENCOUNTER — Ambulatory Visit: Payer: Medicare PPO | Admitting: Podiatry

## 2023-02-11 DIAGNOSIS — B351 Tinea unguium: Secondary | ICD-10-CM

## 2023-02-11 DIAGNOSIS — E1149 Type 2 diabetes mellitus with other diabetic neurological complication: Secondary | ICD-10-CM | POA: Diagnosis not present

## 2023-02-11 DIAGNOSIS — M79674 Pain in right toe(s): Secondary | ICD-10-CM | POA: Diagnosis not present

## 2023-02-11 DIAGNOSIS — M21611 Bunion of right foot: Secondary | ICD-10-CM

## 2023-02-11 DIAGNOSIS — E1140 Type 2 diabetes mellitus with diabetic neuropathy, unspecified: Secondary | ICD-10-CM

## 2023-02-11 DIAGNOSIS — L84 Corns and callosities: Secondary | ICD-10-CM

## 2023-02-11 DIAGNOSIS — M79675 Pain in left toe(s): Secondary | ICD-10-CM

## 2023-02-11 DIAGNOSIS — M21619 Bunion of unspecified foot: Secondary | ICD-10-CM | POA: Diagnosis not present

## 2023-02-13 NOTE — Progress Notes (Signed)
Subjective:   Patient ID: Cody Frazier, male   DOB: 77 y.o.   MRN: [id number]   HPI Patient presents with several different problems with long-term history of diabetes with gait instability issues due to neuropathy and significant lesions plantar right and severe nail disease 1-5 both feet that are thick brittle and painful when pressed.  Patient does not smoke tries to keep sugar under good control tries to be active   Review of Systems  All other systems reviewed and are negative.       Objective:  Physical Exam Vitals and nursing note reviewed.  Constitutional:      Appearance: He is well-developed.  Pulmonary:     Effort: Pulmonary effort is normal.  Musculoskeletal:        General: Normal range of motion.  Skin:    General: Skin is warm.  Neurological:     Mental Status: He is alert.     Neurovascular status found to indicate slight reduction of pulses bilateral and neurologically reduction sharp dull vibratory.  Patient has significant structural malalignment has severe lesions some third metatarsal right and hallux bilateral that are painful when pressed and has severely elongated nailbeds 1-5 both feet that are yellow brittle painful and impossible for him to cut     Assessment:  High risk individual with long-term neuropathy gait instability with nail disease and mycotic component with pain and lesions chronic bilateral     Plan:  Patient prediabetes education given to patient discussed daily inspections of feet and neuropathy and went ahead debrided nailbeds 1-5 both feet nitrogen and bleeding debrided lesions bilateral no iatrogenic bleeding reappoint routine care as needed

## 2023-03-01 ENCOUNTER — Other Ambulatory Visit: Payer: Self-pay | Admitting: Internal Medicine

## 2023-03-04 ENCOUNTER — Other Ambulatory Visit: Payer: Self-pay | Admitting: Cardiovascular Disease

## 2023-03-26 DIAGNOSIS — G4733 Obstructive sleep apnea (adult) (pediatric): Secondary | ICD-10-CM | POA: Diagnosis not present

## 2023-03-27 ENCOUNTER — Ambulatory Visit: Payer: Medicare PPO | Attending: Cardiovascular Disease | Admitting: Cardiovascular Disease

## 2023-03-27 VITALS — BP 128/64 | HR 90 | Ht 65.0 in | Wt 215.2 lb

## 2023-03-27 DIAGNOSIS — I10 Essential (primary) hypertension: Secondary | ICD-10-CM

## 2023-03-27 DIAGNOSIS — I480 Paroxysmal atrial fibrillation: Secondary | ICD-10-CM | POA: Diagnosis not present

## 2023-03-27 DIAGNOSIS — M25473 Effusion, unspecified ankle: Secondary | ICD-10-CM

## 2023-03-27 DIAGNOSIS — D500 Iron deficiency anemia secondary to blood loss (chronic): Secondary | ICD-10-CM | POA: Diagnosis not present

## 2023-03-27 DIAGNOSIS — G4733 Obstructive sleep apnea (adult) (pediatric): Secondary | ICD-10-CM | POA: Diagnosis not present

## 2023-03-27 NOTE — Progress Notes (Signed)
Cardiology Office Note    Date:  04/03/2023   ID:  Cody Frazier 09-30-1946, MRN [id number]  PCP:  Tresa Garter, MD  Cardiologist:  Nicki Guadalajara, MD (sleep); Dr. Royann Shivers  16 month F/U sleep evaluation  History of Present Illness:  Cody Frazier is a 76 y.o. male who presents a 16 month F/U sleep evaluation.   Cody Frazier is a former patient of Dr. Donnie Aho and now sees Dr. Royann Shivers.  He has a history of hypertension, hyperlipidemia, and paroxysmal atrial fibrillation on anticoagulation therapy.  He underwent an echo Doppler study in February 2020 which showed an EF of 60 to 65% with grade 1 diastolic dysfunction.  There was mild aortic sclerosis, mild pulmonary regurgitation and mild dilation of his ascending aorta.  Due to concerns for obstructive sleep apnea he was referred for a sleep study.  Insurance denied an in lab study and he initially underwent a home sleep evaluation on January 28, 2019.  The home study demonstrated severe sleep apnea with an AHI of 50.9/h.  Events were worse with this supine sleep with AHI 53.9/h versus nonsupine sleep at 26.35/h.  He had severe oxygen desaturation to a nadir of 75%.  Time spent under 89% was 83 minutes.  He snored for 12.4% of his sleep.  He was referred for CPAP titration and fortunately insurance approved an in lab study which was done on April 30, 2019.  CPAP was increased to 13 cm water pressure with excellent result with an AHI of 0 and oxygen nadir at 91%.  His CPAP set up date was May 25, 2019 with [facility] as his DME company.  He has been using a full facemask.  A download was obtained in the office today from July 14, 2019 through August 12, 2019.  This reveals excellent compliance with 100% usage days and average usage at 7 hours and 44 minutes.  At his 13 cm set pressure, AHI is 2.7.  There is no mask leak.  Since initiating CPAP therapy, he notes significant improvement in his sleep.  He denies frequent  awakenings.  He is unaware of breakthrough snoring.  Previous nocturia 3 times per night has essentially resolved.  He is dreaming.  Most nights he sleeps without waking up.  He denies any hypnagogic hallucinations or sleep paralysis.  He denies any residual daytime sleepiness.  An Epworth Sleepiness Scale score was calculated in the office today and this endorsed at 5 arguing against residual daytime sleepiness.  He typically goes to bed between 11 and 11:30 each night and wakes up between 7 and 8 AM.  When I initially saw him he was on diltiazem 240 mg, metoprolol 25 mg daily and telmisartan 80 mg for hypertension as well as his PAF and  Eliquis anticoagulation.  He wason atorvastatin 20 mg daily for hyperlipidemia.  I saw him on September 12, 2020.  Over the prior year he continued to do well.  He has been using CPAP with continued excellent compliance.  I obtained a new download from October 2 through September 11, 2020.  He was averaging 9 hours and 25 minutes of CPAP use per night.  AHI was 1.8 at a 13 cm CPAP pressure.  He has a Barista F 30i mask.  There was no leak with the exception of one night.  He believes he is sleeping well.  He will be is going to see his son who is currently living in [place],  [place].  He was hospitalized from October 1 through August 14, 2020 with increasing shortness of breath in the setting of low hemoglobin.  Occult blood was negative and colonoscopy did not reveal any source of bleeding.  He was maintaining sinus rhythm I did not have recurrent atrial fibrillation.  During his hospitalization telmisartan was held in the hospital but resumed at discharge and metoprolol was continued.  I last saw him on November 20, 2021.  Since his prior evaluation  he has continued to see Dr. Posey Rea for his primary care.  He has a history of Barrett's esophagus and also has had bleeding ulcer contributing to anemia.  Over the past several weeks he has noticed increasing shortness of  breath and mild edema right greater than left ankle.  He has a prescription for furosemide 40 mg but has been taking this only as needed.  He continues to be on metoprolol succinate 25 mg daily, spironolactone 25 mg and telmisartan 20 mg daily.  He has a history of PAF and continues to be on low-dose Eliquis 2.5 mg twice a day and has been maintaining sinus rhythm.  He continues to use CPAP with excellent compliance.  A download was obtained from October 17, 2021 through November 15, 2021.  Usage days was 97% with usage daily at 8 hours and 36 minutes.  AHI was 0.6 at his 13 cm set pressure.  With therapy there is no snoring.  His previous nocturia had improved from 4-5 times per night to 0.  Epworth Sleepiness Scale score was calculated in the office today and this endorsed at 4 arguing against residual daytime sleepiness.  During that evaluation, I suggested he have a follow-up echo Doppler study prior to his next appointment to see Dr. Royann Shivers.  Since I last saw him, he underwent an echo Doppler study in May 13, 2022 which showed EF at 60 to 65% without wall motion abnormalities with grade 1 diastolic dysfunction.  There was mild aortic sclerosis.  He was briefly hospitalized in July 2023 with chest pain.  Cardiac enzymes and ECG were normal.  CT angiogram of chest and pelvis did not show aortic aneurysm, dissection or PE.  It was felt that his chest discomfort was most likely GI in etiology.  From a sleep perspective, he has continued to use CPAP therapy.  [facility] has been his DME company and will be set up with [facility].  A download was obtained from April 14 through Mar 25, 2023 which showed excellent usage averaging 8 hours and 52 minutes per night.  There was only 1 day of nonuse.  At a set pressure of 13 cm of water, AHI is 2.1.  He is sleeping well.  An Epworth Sleepiness Scale score was calculated in the office today and this endorsed at 8 arguing against residual daytime sleepiness.  He  presents for a 94-month follow-up evaluation.   Past Medical History:  Diagnosis Date   Allergic rhinitis    Anemia, iron deficiency    Arthritis    Atrial arrhythmia    Atrial fib/flutter, transient (HCC)    following prior surgeries x 2   Barrett's esophagus without dysplasia    BPH (benign prostatic hyperplasia)    Bright's disease    Chronic LBP    Closed fracture of right distal radius    DDD (degenerative disc disease)    Diverticulosis of colon    Duodenal stricture    Gastric outlet obstruction    GERD (gastroesophageal  reflux disease)    Glaucoma    Hemorrhoids    History of kidney stones    Hyperlipidemia    Hyperplastic colonic polyp 01/2000   Hypertension    Nephrolithiasis    hx of B   Peptic ulcer disease with hemorrhage 08/2008   and [facility]. Pylori Ab negative   Renal cyst    Sleep apnea with use of continuous positive airway pressure (CPAP)    Tubular adenoma of colon 03/2012   Wears glasses     Past Surgical History:  Procedure Laterality Date   APPENDECTOMY     BACK SURGERY  02/2003   L5   BILROTH I PROCEDURE  2011   Dr Michaell Cowing   CLOSED REDUCTION WRIST FRACTURE Right 01/06/2019   Procedure: Closed Reduction Wrist/Pinning Wrist;  Surgeon: Knute Neu, MD;  Location: [facility] OR;  Service: Plastics;  Laterality: Right;   COLONOSCOPY     COLONOSCOPY WITH PROPOFOL N/A 08/14/2020   Procedure: COLONOSCOPY WITH PROPOFOL;  Surgeon: Rachael Fee, MD;  Location: [facility] ENDOSCOPY;  Service: Endoscopy;  Laterality: N/A;   HIATAL HERNIA REPAIR     LAPAROTOMY N/A 05/10/2017   Procedure: EXPLORATORY LAPAROTOMY WITH ENTEROLYSIS;  Surgeon: Luretha Murphy, MD;  Location: [facility] ORS;  Service: General;  Laterality: N/A;   LUMBAR FUSION  2009   SMALL INTESTINE SURGERY     TONSILLECTOMY     UPPER GASTROINTESTINAL ENDOSCOPY  07/26/2020    Current Medications: Outpatient Medications Prior to Visit  Medication Sig Dispense Refill   acetaminophen (TYLENOL) 500 MG tablet Take  1,000 mg by mouth every 6 (six) hours as needed for mild pain or moderate pain. Maximum daily dose of 3g     atorvastatin (LIPITOR) 20 MG tablet TAKE 1 TABLET BY MOUTH EVERY DAY 90 tablet 3   cetirizine (ZYRTEC) 10 MG tablet Take 10 mg by mouth daily.     Cholecalciferol (VITAMIN D3) 1.25 MG (50000 UT) CAPS TAKE 1 CAPSULE BY MOUTH MONTHLY 3 capsule 3   ELIQUIS 2.5 MG TABS tablet TAKE 1 TABLET BY MOUTH TWICE A DAY 180 tablet 3   famotidine (PEPCID) 40 MG tablet Take 1 tablet (40 mg total) by mouth at bedtime. 30 tablet 11   fluticasone (FLONASE) 50 MCG/ACT nasal spray SPRAY 2 SPRAYS INTO EACH NOSTRIL EVERY DAY 48 mL 3   furosemide (LASIX) 40 MG tablet Take 1 tablet (40 mg total) by mouth every other day. Take 40mg  every other day. May take an extra dose if your weight is over 212 pounds. 60 tablet 3   hyoscyamine (LEVSIN SL) 0.125 MG SL tablet Place 1-2 tablets (0.125-0.25 mg total) under the tongue every 4 (four) hours as needed for cramping. 120 tablet 1   latanoprost (XALATAN) 0.005 % ophthalmic solution Place 1 drop into both eyes at bedtime.  11   metoprolol succinate (TOPROL-XL) 25 MG 24 hr tablet TAKE 1 TABLET BY MOUTH EVERY DAY 90 tablet 3   pantoprazole (PROTONIX) 40 MG tablet Take 1 tablet (40 mg total) by mouth 2 (two) times daily. 60 tablet 11   spironolactone (ALDACTONE) 25 MG tablet TAKE 1 TABLET BY MOUTH EVERY DAY 90 tablet 3   telmisartan (MICARDIS) 20 MG tablet Take 0.5 tablets (10 mg total) by mouth daily. 45 tablet 3   timolol (TIMOPTIC) 0.25 % ophthalmic solution Place 1 drop into both eyes 2 (two) times daily.      triamcinolone cream (KENALOG) 0.1 % Apply 1 application topically 3 (three) times daily. 80  g 3   oxyCODONE (ROXICODONE) 15 MG immediate release tablet Take 1 tablet (15 mg total) by mouth every 4 (four) hours as needed for pain. 120 tablet 0   RYBELSUS 14 MG TABS TAKE 1 TABLET (14 MG TOTAL) BY MOUTH DAILY 90 tablet 3   No facility-administered medications prior to  visit.     Allergies:   Codeine phosphate, Nsaids, and Adhesive [tape]   Social History   Socioeconomic History   Marital status: Married    Spouse name: Marioalberto Jez   Number of children: 1   Years of education: Not on file   Highest education level: Not on file  Occupational History   Occupation: PASTOR    Employer: [employer]  Tobacco Use   Smoking status: Former   Smokeless tobacco: Never   Tobacco comments:    as a teenager  Vaping Use   Vaping Use: Never used  Substance and Sexual Activity   Alcohol use: No   Drug use: No   Sexual activity: Yes  Other Topics Concern   Not on file  Social History Narrative   Patient gets regular exercise   Married x 42 years   Social Determinants of Health   Financial Resource Strain: Low Risk  (01/14/2023)   Overall Financial Resource Strain (CARDIA)    Difficulty of Paying Living Expenses: Not hard at all  Food Insecurity: No Food Insecurity (01/14/2023)   Hunger Vital Sign    Worried About Running Out of Food in the Last Year: Never true    Ran Out of Food in the Last Year: Never true  Transportation Needs: No Transportation Needs (01/14/2023)   PRAPARE - Administrator, Civil Service (Medical): No    Lack of Transportation (Non-Medical): No  Physical Activity: Inactive (01/14/2023)   Exercise Vital Sign    Days of Exercise per Week: 0 days    Minutes of Exercise per Session: 0 min  Stress: No Stress Concern Present (01/14/2023)   [employer] of Occupational Health - Occupational Stress Questionnaire    Feeling of Stress : Not at all  Social Connections: Socially Integrated (01/14/2023)   Social Connection and Isolation Panel [NHANES]    Frequency of Communication with Friends and Family: More than three times a week    Frequency of Social Gatherings with Friends and Family: More than three times a week    Attends Religious Services: More than 4 times per year    Active Member of [employer] or  Organizations: Yes    Attends Engineer, structural: More than 4 times per year    Marital Status: Married     Family History:  The patient's family history includes Colon cancer in his cousin; Colon cancer (age of onset: 98) in his paternal uncle and paternal uncle; Colon cancer (age of onset: 71) in his father; Colon cancer (age of onset: 63) in his mother; Colon polyps in his father and mother; Drug abuse in his sister; Heart disease in his mother; Hypertension in an other family member; Prostate cancer in his father; Stroke in his father.   ROS General: Negative; No fevers, chills, or night sweats;  HEENT: Negative; No changes in vision or hearing, sinus congestion, difficulty swallowing Pulmonary: Negative; No cough, wheezing, shortness of breath, hemoptysis Cardiovascular: Negative; No chest pain, presyncope, syncope, palpitations GI: Negative; No nausea, vomiting, diarrhea, or abdominal pain GU: Negative; No dysuria, hematuria, or difficulty voiding Musculoskeletal: He is status post 2 back surgeries making walking  difficult; he admits to back discomfort Hematologic/Oncology: Negative; no easy bruising, bleeding Endocrine: Negative; no heat/cold intolerance; no diabetes Neuro: Negative; no changes in balance, headaches Skin: Negative; No rashes or skin lesions Psychiatric: Negative; No behavioral problems, depression Sleep: Positive for OSA on CPAP therapy with set up date May 25, 2019.  [facility] has been his DME company and he: Was now transferred to [facility]. He admits to feeling well with marked improvement since CPAP initiation.  No breakthrough snoring.  No residual daytime sleepiness, nocturia has improved from 4-5 times per night to 0; no bruxism, restless legs, hypnogognic hallucinations, no cataplexy Other comprehensive 14 point system review is negative.   PHYSICAL EXAM:   VS:  BP 128/64   Pulse 90   Ht 5\' 5"  (1.651 m)   Wt 215 lb 3.2 oz (97.6 kg)    SpO2 97%   BMI 35.81 kg/m     Repeat blood pressure by me was 122/74  Wt Readings from Last 3 Encounters:  04/02/23 216 lb (98 kg)  03/27/23 215 lb 3.2 oz (97.6 kg)  02/08/23 214 lb 3.2 oz (97.2 kg)    General: Alert, oriented, no distress.  Skin: normal turgor, no rashes, warm and dry HEENT: Normocephalic, atraumatic. Pupils equal round and reactive to light; sclera anicteric; extraocular muscles intact;  Nose without nasal septal hypertrophy Mouth/Parynx benign; Mallinpatti scale 3/4 Neck: No JVD, no carotid bruits; normal carotid upstroke Lungs: clear to ausculatation and percussion; no wheezing or rales Chest wall: without tenderness to palpitation Heart: PMI not displaced, RRR, s1 s2 normal, 1/6 systolic murmur, no diastolic murmur, no rubs, gallops, thrills, or heaves Abdomen: soft, nontender; no hepatosplenomehaly, BS+; abdominal aorta nontender and not dilated by palpation. Back: no CVA tenderness Pulses 2+ Musculoskeletal: full range of motion, normal strength, no joint deformities Extremities: Trivial ankle edema; no clubbing cyanosis, Homan's sign negative  Neurologic: grossly nonfocal; Cranial nerves grossly wnl Psychologic: Normal mood and affect    Studies/Labs Reviewed:   Mar 27, 2023 ECG (independently read by me): NSR at 90, no ectopy  November 20, 2021 ECG (independently read by me): NSR at 73     September 12, 2020 ECG (independently read by me): NSR at 83, normal intervals  January 13, 2019 ECG (independently read by me): Normal sinus rhythm at 84 bpm.  PR interval 202 ms.  No ectopy.  Recent Labs:    Latest Ref Rng & Units 02/04/2023    2:26 PM 01/10/2023   12:54 PM 01/02/2023    4:19 PM  BMP  Glucose 70 - 99 mg/dL 098  86  119   BUN 8 - 27 mg/dL 16  15  22    Creatinine 0.76 - 1.27 mg/dL 1.47  8.29  5.62   BUN/Creat Ratio 10 - 24 13     Sodium 134 - 144 mmol/L 141  139  139   Potassium 3.5 - 5.2 mmol/L 5.1  4.9  5.5 No hemolysis seen   Chloride 96 - 106  mmol/L 104  102  105   CO2 20 - 29 mmol/L 21  28  23    Calcium 8.6 - 10.2 mg/dL 13.0  86.5  78.4         Latest Ref Rng & Units 01/10/2023   12:54 PM 01/02/2023    4:19 PM 05/13/2022    3:13 AM  Hepatic Function  Total Protein 6.0 - 8.3 g/dL 6.8  7.4  6.4   Albumin 3.5 - 5.2 g/dL 3.9  4.4  3.5   AST 0 - 37 U/L 14  14  18    ALT 0 - 53 U/L 12  15  15    Alk Phosphatase 39 - 117 U/L 63  67  76   Total Bilirubin 0.2 - 1.2 mg/dL 0.5  0.5  0.9        Latest Ref Rng & Units 02/04/2023    2:26 PM 05/13/2022    3:13 AM 05/12/2022   11:45 PM  CBC  WBC 3.4 - 10.8 x10E3/uL 6.0  9.1  8.7   Hemoglobin 13.0 - 17.7 g/dL 16.1  09.6  04.5   Hematocrit 37.5 - 51.0 % 39.4  40.4  42.1   Platelets 150 - 450 x10E3/uL 343  291  304    Lab Results  Component Value Date   MCV 93 02/04/2023   MCV 94.6 05/13/2022   MCV 95.5 05/12/2022   Lab Results  Component Value Date   TSH 1.07 01/02/2023   Lab Results  Component Value Date   HGBA1C 5.8 01/02/2023     BNP    Component Value Date/Time   BNP 55.8 02/04/2023 1426    ProBNP No results found for: "PROBNP"   Lipid Panel     Component Value Date/Time   CHOL 196 11/20/2018 1532   TRIG 269.0 (H) 11/20/2018 1532   TRIG 231 (HH) 09/13/2006 0957   HDL 43.60 11/20/2018 1532   CHOLHDL 4 11/20/2018 1532   VLDL 53.8 (H) 11/20/2018 1532   LDLCALC 86 11/25/2017 1021   LDLDIRECT 109.0 11/20/2018 1532     RADIOLOGY: No results found.   Additional studies/ records that were reviewed today include:  I reviewed the patient's office evaluations, echo Doppler study, recent home sleep study, subsequent CPAP titration study, and obtained a download in the office today.  I reviewed the patient's recent hospitalization from early October 2021.    Epworth Sleepiness Scale does not demonstrate any residual daytime sleepiness. ASSESSMENT:    1. Essential hypertension   2. OSA on CPAP   3. Paroxysmal atrial fibrillation (HCC)   4. Iron deficiency  anemia due to chronic blood loss   5. Ankle swelling, unspecified laterality     PLAN:  Mr. Procopio Rovner is a 77 year-old retired [employer] who has a history of hypertension, hyperlipidemia, as well as paroxysmal atrial fibrillation.  Due to concerns for obstructive sleep apnea with significant snoring, waking up gasping for breath, frequent nocturia, he was referred for sleep study and was found to have severe obstructive sleep apnea (AHI of 50.9/h and oxygen nadir at 75%.)  He underwent a CPAP titration stud and at 13 cm water pressure AHI was 0 and O2 nadir 91%.  There was resolution of snoring.  Since his CPAP initiation, he is continue to notice a dramatic improvement in his sleep quality.  He is unaware of any breakthrough snoring.  His nocturia which was 4-5 times per night is essentially 0.  He denies any residual daytime sleepiness.  Compliance has remained excellent and has been maintained at 13 cm water pressure with excellent AHI.  At his last evaluation with me in January 2023 compliance was excellent and AHI was 0.6 with therapy.  His prior DME company is no longer providing CPAP care.  As result he has just been set up with [facility] as his DME company.  His most recent download from April 14 through Mar 25, 2023 continues to show excellent compliance with average use at 8  hours and 52 minutes per night.  At 13 cm water pressure AHI is 2.1.  He has been using a ResMed AirFit F30i mask which she does well with.  Epworth Sleepiness Scale score endorsed at 8 arguing against residual daytime sleepiness.  His blood pressure today is stable and on repeat by me was 122/74.  He continues to be on furosemide 40 mg every other day, metoprolol 25 mg daily, telmisartan 10 mg and spironolactone 25 mg daily for hypertension.  He is on pantoprazole for GERD.  He was on Eliquis for anticoagulation and atorvastatin for hyperlipidemia.  He has iron deficiency anemia and has had issues with bleeding  leading to reduction in his Eliquis dose down to 2.5 mg twice a day.  He has issues resulting from lower spine scoliosis.  He continues to be followed by Dr. Royann Shivers as well as Dr. Posey Rea.  From a sleep perspective, as long as he is stable, I will see him in 1 year for follow-up evaluation or sooner as needed.   Medication Adjustments/Labs and Tests Ordered: Current medicines are reviewed at length with the patient today.  Concerns regarding medicines are outlined above.  Medication changes, Labs and Tests ordered today are listed in the Patient Instructions below. Patient Instructions  Medication Instructions:  No changes *If you need a refill on your cardiac medications before your next appointment, please call your pharmacy*  Follow-Up: At [facility], you and your health needs are our priority.  As part of our continuing mission to provide you with exceptional heart care, we have created designated Provider Care Teams.  These Care Teams include your primary Cardiologist (physician) and Advanced Practice Providers (APPs -  Physician Assistants and Nurse Practitioners) who all work together to provide you with the care you need, when you need it.  We recommend signing up for the patient portal called "MyChart".  Sign up information is provided on this After Visit Summary.  MyChart is used to connect with patients for Virtual Visits (Telemedicine).  Patients are able to view lab/test results, encounter notes, upcoming appointments, etc.  Non-urgent messages can be sent to your provider as well.   To learn more about what you can do with MyChart, go to [email or web address].    Your next appointment:   1 year(s) Sleep  Provider:   Dr Tresa Endo    Signed, Nicki Guadalajara, MD  04/03/2023 7:07 PM    [facility] [address], [address], [place], [place]  [address] Phone: [phone]

## 2023-03-27 NOTE — Patient Instructions (Signed)
Medication Instructions:  No changes *If you need a refill on your cardiac medications before your next appointment, please call your pharmacy*  Follow-Up: At [facility], you and your health needs are our priority.  As part of our continuing mission to provide you with exceptional heart care, we have created designated Provider Care Teams.  These Care Teams include your primary Cardiologist (physician) and Advanced Practice Providers (APPs -  Physician Assistants and Nurse Practitioners) who all work together to provide you with the care you need, when you need it.  We recommend signing up for the patient portal called "MyChart".  Sign up information is provided on this After Visit Summary.  MyChart is used to connect with patients for Virtual Visits (Telemedicine).  Patients are able to view lab/test results, encounter notes, upcoming appointments, etc.  Non-urgent messages can be sent to your provider as well.   To learn more about what you can do with MyChart, go to [email or web address].    Your next appointment:   1 year(s) Sleep  Provider:   Dr Tresa Endo

## 2023-04-02 ENCOUNTER — Ambulatory Visit (INDEPENDENT_AMBULATORY_CARE_PROVIDER_SITE_OTHER): Payer: Medicare PPO | Admitting: Internal Medicine

## 2023-04-02 ENCOUNTER — Encounter: Payer: Self-pay | Admitting: Internal Medicine

## 2023-04-02 VITALS — BP 120/78 | HR 85 | Temp 97.9°F | Ht 65.0 in | Wt 216.0 lb

## 2023-04-02 DIAGNOSIS — Z7985 Long-term (current) use of injectable non-insulin antidiabetic drugs: Secondary | ICD-10-CM

## 2023-04-02 DIAGNOSIS — E669 Obesity, unspecified: Secondary | ICD-10-CM | POA: Diagnosis not present

## 2023-04-02 DIAGNOSIS — E1169 Type 2 diabetes mellitus with other specified complication: Secondary | ICD-10-CM | POA: Diagnosis not present

## 2023-04-02 DIAGNOSIS — F419 Anxiety disorder, unspecified: Secondary | ICD-10-CM | POA: Diagnosis not present

## 2023-04-02 DIAGNOSIS — M519 Unspecified thoracic, thoracolumbar and lumbosacral intervertebral disc disorder: Secondary | ICD-10-CM

## 2023-04-02 DIAGNOSIS — D500 Iron deficiency anemia secondary to blood loss (chronic): Secondary | ICD-10-CM

## 2023-04-02 DIAGNOSIS — I480 Paroxysmal atrial fibrillation: Secondary | ICD-10-CM | POA: Diagnosis not present

## 2023-04-02 DIAGNOSIS — E559 Vitamin D deficiency, unspecified: Secondary | ICD-10-CM

## 2023-04-02 MED ORDER — OXYCODONE HCL 15 MG PO TABS: 15.0000 mg | ORAL_TABLET | Freq: Four times a day (QID) | ORAL | 0 refills | Status: DC | PRN

## 2023-04-02 MED ORDER — OZEMPIC (0.25 OR 0.5 MG/DOSE) 2 MG/3ML ~~LOC~~ SOPN
PEN_INJECTOR | SUBCUTANEOUS | 1 refills | Status: DC
Start: 2023-04-02 — End: 2023-07-03

## 2023-04-02 NOTE — Assessment & Plan Note (Signed)
No active bleeding 

## 2023-04-02 NOTE — Assessment & Plan Note (Signed)
CHA2DS2VASC score 3 Cont on anticoagulation - Eliquis

## 2023-04-02 NOTE — Assessment & Plan Note (Signed)
D/c Rybelsus Will try Ozempic

## 2023-04-02 NOTE — Progress Notes (Unsigned)
Subjective:  Patient ID: Cody Frazier, male    DOB: 03/04/1946  Age: 77 y.o. MRN: [id number]  CC: Follow-up (3 mnth f/u)   HPI ZAVEON NOLTON presents for LBP - worse, DM, HTN Nausea from Rybelsus is prevalently, however, unable to lose weight   Outpatient Medications Prior to Visit  Medication Sig Dispense Refill   acetaminophen (TYLENOL) 500 MG tablet Take 1,000 mg by mouth every 6 (six) hours as needed for mild pain or moderate pain. Maximum daily dose of 3g     atorvastatin (LIPITOR) 20 MG tablet TAKE 1 TABLET BY MOUTH EVERY DAY 90 tablet 3   cetirizine (ZYRTEC) 10 MG tablet Take 10 mg by mouth daily.     Cholecalciferol (VITAMIN D3) 1.25 MG (50000 UT) CAPS TAKE 1 CAPSULE BY MOUTH MONTHLY 3 capsule 3   ELIQUIS 2.5 MG TABS tablet TAKE 1 TABLET BY MOUTH TWICE A DAY 180 tablet 3   famotidine (PEPCID) 40 MG tablet Take 1 tablet (40 mg total) by mouth at bedtime. 30 tablet 11   fluticasone (FLONASE) 50 MCG/ACT nasal spray SPRAY 2 SPRAYS INTO EACH NOSTRIL EVERY DAY 48 mL 3   furosemide (LASIX) 40 MG tablet Take 1 tablet (40 mg total) by mouth every other day. Take 40mg  every other day. May take an extra dose if your weight is over 212 pounds. 60 tablet 3   hyoscyamine (LEVSIN SL) 0.125 MG SL tablet Place 1-2 tablets (0.125-0.25 mg total) under the tongue every 4 (four) hours as needed for cramping. 120 tablet 1   latanoprost (XALATAN) 0.005 % ophthalmic solution Place 1 drop into both eyes at bedtime.  11   metoprolol succinate (TOPROL-XL) 25 MG 24 hr tablet TAKE 1 TABLET BY MOUTH EVERY DAY 90 tablet 3   pantoprazole (PROTONIX) 40 MG tablet Take 1 tablet (40 mg total) by mouth 2 (two) times daily. 60 tablet 11   spironolactone (ALDACTONE) 25 MG tablet TAKE 1 TABLET BY MOUTH EVERY DAY 90 tablet 3   telmisartan (MICARDIS) 20 MG tablet Take 0.5 tablets (10 mg total) by mouth daily. 45 tablet 3   timolol (TIMOPTIC) 0.25 % ophthalmic solution Place 1 drop into both eyes 2 (two) times  daily.      triamcinolone cream (KENALOG) 0.1 % Apply 1 application topically 3 (three) times daily. 80 g 3   oxyCODONE (ROXICODONE) 15 MG immediate release tablet Take 1 tablet (15 mg total) by mouth every 4 (four) hours as needed for pain. 120 tablet 0   RYBELSUS 14 MG TABS TAKE 1 TABLET (14 MG TOTAL) BY MOUTH DAILY 90 tablet 3   No facility-administered medications prior to visit.    ROS: Review of Systems  Constitutional:  Negative for appetite change, fatigue and unexpected weight change.  HENT:  Negative for congestion, nosebleeds, sneezing, sore throat and trouble swallowing.   Eyes:  Negative for itching and visual disturbance.  Respiratory:  Negative for cough.   Cardiovascular:  Negative for chest pain, palpitations and leg swelling.  Gastrointestinal:  Negative for abdominal distention, blood in stool, diarrhea and nausea.  Genitourinary:  Negative for frequency and hematuria.  Musculoskeletal:  Positive for arthralgias, back pain and gait problem. Negative for joint swelling and neck pain.  Skin:  Negative for rash.  Neurological:  Negative for dizziness, tremors, speech difficulty and weakness.  Psychiatric/Behavioral:  Negative for agitation, dysphoric mood and sleep disturbance. The patient is not nervous/anxious.     Objective:  BP 120/78 (BP Location: Right  Arm, Patient Position: Sitting, Cuff Size: Large)   Pulse 85   Temp 97.9 F (36.6 C) (Oral)   Ht 5\' 5"  (1.651 m)   Wt 216 lb (98 kg)   SpO2 98%   BMI 35.94 kg/m   BP Readings from Last 3 Encounters:  04/02/23 120/78  03/27/23 128/64  02/08/23 106/69    Wt Readings from Last 3 Encounters:  04/02/23 216 lb (98 kg)  03/27/23 215 lb 3.2 oz (97.6 kg)  02/08/23 214 lb 3.2 oz (97.2 kg)    Physical Exam Constitutional:      General: He is not in acute distress.    Appearance: He is well-developed. He is obese.     Comments: NAD  Eyes:     Conjunctiva/sclera: Conjunctivae normal.     Pupils: Pupils are  equal, round, and reactive to light.  Neck:     Thyroid: No thyromegaly.     Vascular: No JVD.  Cardiovascular:     Rate and Rhythm: Normal rate and regular rhythm.     Heart sounds: Normal heart sounds. No murmur heard.    No friction rub. No gallop.  Pulmonary:     Effort: Pulmonary effort is normal. No respiratory distress.     Breath sounds: Normal breath sounds. No wheezing or rales.  Chest:     Chest wall: No tenderness.  Abdominal:     General: Bowel sounds are normal. There is no distension.     Palpations: Abdomen is soft. There is no mass.     Tenderness: There is no abdominal tenderness. There is no guarding or rebound.  Musculoskeletal:        General: Tenderness present. Normal range of motion.     Cervical back: Normal range of motion.     Right lower leg: No edema.     Left lower leg: No edema.  Lymphadenopathy:     Cervical: No cervical adenopathy.  Skin:    General: Skin is warm and dry.     Findings: No rash.  Neurological:     Mental Status: He is alert and oriented to person, place, and time.     Cranial Nerves: No cranial nerve deficit.     Motor: Weakness present. No abnormal muscle tone.     Coordination: Coordination normal.     Gait: Gait abnormal.     Deep Tendon Reflexes: Reflexes are normal and symmetric.  Psychiatric:        Behavior: Behavior normal.        Thought Content: Thought content normal.        Judgment: Judgment normal.   Using a walker  Lab Results  Component Value Date   WBC 6.0 02/04/2023   HGB 13.6 02/04/2023   HCT 39.4 02/04/2023   PLT 343 02/04/2023   GLUCOSE 100 (H) 02/04/2023   CHOL 196 11/20/2018   TRIG 269.0 (H) 11/20/2018   HDL 43.60 11/20/2018   LDLDIRECT 109.0 11/20/2018   LDLCALC 86 11/25/2017   ALT 12 01/10/2023   AST 14 01/10/2023   NA 141 02/04/2023   K 5.1 02/04/2023   CL 104 02/04/2023   CREATININE 1.23 02/04/2023   BUN 16 02/04/2023   CO2 21 02/04/2023   TSH 1.07 01/02/2023   PSA 0.16 11/20/2018    INR 1.1 08/12/2020   HGBA1C 5.8 01/02/2023    MR LUMBAR SPINE WO CONTRAST  Result Date: 06/04/2022 CLINICAL DATA:  Lumbar radiculopathy. Chronic mid and lower back pain with pain radiating into  the shoulders. Prior lumbar surgery. EXAM: MRI LUMBAR SPINE WITHOUT CONTRAST TECHNIQUE: Multiplanar, multisequence MR imaging of the lumbar spine was performed. No intravenous contrast was administered. COMPARISON:  CTA chest, abdomen, and pelvis 05/13/2022. Lumbar myelogram 04/19/2008. FINDINGS: Segmentation:  Standard. Alignment: Thoracolumbar dextroscoliosis. Trace fused anterolisthesis of L5 on S1 and trace retrolisthesis of L1 on L2. Vertebrae: No fracture or suspicious marrow lesion. Prior L2-S1 posterior fusion. Interbody spacers at L2-3, L3-4, and L4-5 with interbody osseous fusion at these levels as well as at L1-2 and L5-S1. Prominent degenerative endplate edema at Z61-09 as noted on today's separate thoracic spine MRI. Conus medullaris and cauda equina: Conus extends to the L1-2 level. Conus and cauda equina appear normal. Paraspinal and other soft tissues: Bilateral renal cysts. Postoperative changes in the posterior lumbar soft tissues. Fluid collection in the laminectomy bed from L3-S1 without mass effect on the thecal sac. Disc levels: Lower thoracic spine reported separately. T12-L1: Disc desiccation and severe disc space narrowing. Mild disc bulging and facet arthrosis without significant stenosis. L1-2: Prior posterior decompression. Retrolisthesis and endplate spurring without significant stenosis. L2-3: Prior posterior decompression and fusion.  No stenosis. L3-4: Prior posterior decompression and fusion.  No stenosis. L4-5: Prior posterior decompression and fusion. Widely patent spinal canal and left neural foramen. The right L5 screw courses superior to the pedicle, traversing the inferior aspect of the right L4 neural foramen. L5-S1: Prior posterior decompression and fusion. Right eccentric  disc bulging and endplate and facet spurring result in moderate right lateral recess and moderate right neural foraminal stenosis. No generalized spinal stenosis. Patent left neural foramen. IMPRESSION: 1. L2-S1 fusion. The right L5 screw traverses the inferior aspect of the right L4-5 neural foramen. 2. Moderate right lateral recess and right neural foraminal stenosis at L5-S1 due to spurring. Electronically Signed   By: Sebastian Ache M.D.   On: 06/04/2022 12:10   MR THORACIC SPINE WO CONTRAST  Result Date: 06/04/2022 CLINICAL DATA:  Lumbar radiculopathy. Chronic mid and lower back pain with pain radiating into the shoulders. Prior lumbar surgery. EXAM: MRI THORACIC SPINE WITHOUT CONTRAST TECHNIQUE: Multiplanar, multisequence MR imaging of the thoracic spine was performed. No intravenous contrast was administered. COMPARISON:  CTA chest, abdomen, and pelvis 05/13/2022. FINDINGS: Alignment: Increased upper thoracic kyphosis. Thoracolumbar dextroscoliosis. No significant listhesis. Vertebrae: No fracture or suspicious marrow lesion. Prominent degenerative endplate edema at U04-54. Mild degenerative endplate edema at U98-11 and minimal edema at T6-7. Cord:  Normal signal. Paraspinal and other soft tissues: Bilateral renal cysts. Disc levels: Incompletely evaluated disc degeneration in the cervical spine including at C5-6 where a disc osteophyte complex may contact and mildly deform the spinal cord. Small left paracentral disc protrusion at T2-3 without significant spinal cord mass effect or stenosis. Mild disc bulging in the midthoracic spine without stenosis. At T10-11, disc bulging, a right subarticular to foraminal disc protrusion, and asymmetrically severe right facet arthrosis result in mild spinal stenosis and severe right neural foraminal stenosis. At T11-12, disc bulging and facet arthrosis result in borderline spinal stenosis without neural foraminal stenosis. IMPRESSION: 1. Multilevel thoracic disc and  facet degeneration, most notable at T10-11 where there is mild spinal stenosis and severe right neural foraminal stenosis. 2. Prominent degenerative endplate edema at B14-78. Electronically Signed   By: Sebastian Ache M.D.   On: 06/04/2022 11:43    Assessment & Plan:   Problem List Items Addressed This Visit     Iron deficiency anemia    No active bleeding  Anxiety disorder - Primary    On Buspar      Lumbar disc disease    Chronic Continue with oxycodone 15 mg 4 times a day as needed  Potential benefits of a long term opioids use as well as potential risks (i.e. addiction risk, apnea etc) and complications (i.e. Somnolence, constipation and others) were explained to the patient and were aknowledged. Continue with the weight loss effort      Paroxysmal atrial fibrillation (HCC)    CHA2DS2VASC score 3 Cont on anticoagulation - Eliquis      Vitamin D deficiency    On Vit D      Type 2 diabetes mellitus with obesity (HCC)    D/c Rybelsus Will try Ozempic      Relevant Medications   Semaglutide,0.25 or 0.5MG /DOS, (OZEMPIC, 0.25 OR 0.5 MG/DOSE,) 2 MG/3ML SOPN      Meds ordered this encounter  Medications   oxyCODONE (ROXICODONE) 15 MG immediate release tablet    Sig: Take 1 tablet (15 mg total) by mouth every 6 (six) hours as needed for pain.    Dispense:  120 tablet    Refill:  0    Please fill on or after 05/05/23   oxyCODONE (ROXICODONE) 15 MG immediate release tablet    Sig: Take 1 tablet (15 mg total) by mouth every 6 (six) hours as needed for pain.    Dispense:  120 tablet    Refill:  0    Please fill on or after 04/05/23   Semaglutide,0.25 or 0.5MG /DOS, (OZEMPIC, 0.25 OR 0.5 MG/DOSE,) 2 MG/3ML SOPN    Sig: Use 0.25 mg weekly sq for 1 month, then 0.5 mg sq weekly    Dispense:  9 mL    Refill:  1      Follow-up: Return in about 2 months (around 06/02/2023) for a follow-up visit.  Sonda Primes, MD

## 2023-04-02 NOTE — Assessment & Plan Note (Signed)
On Vit D 

## 2023-04-02 NOTE — Assessment & Plan Note (Signed)
On Buspar 

## 2023-04-03 ENCOUNTER — Encounter: Payer: Self-pay | Admitting: Cardiovascular Disease

## 2023-04-03 NOTE — Assessment & Plan Note (Signed)
Chronic Continue with oxycodone 15 mg 4 times a day as needed  Potential benefits of a long term opioids use as well as potential risks (i.e. addiction risk, apnea etc) and complications (i.e. Somnolence, constipation and others) were explained to the patient and were aknowledged. Continue with the weight loss effort

## 2023-04-09 ENCOUNTER — Telehealth: Payer: Self-pay | Admitting: Gastroenterology

## 2023-04-09 ENCOUNTER — Telehealth: Payer: Self-pay | Admitting: Internal Medicine

## 2023-04-09 NOTE — Telephone Encounter (Signed)
Inbound call from patient , he is requesting a call back from a nurse in regards med pepcid . Patient stated he had have throwing up 3 times in  the past 5 days. Please advise

## 2023-04-09 NOTE — Telephone Encounter (Signed)
The pt states that he was put on Ozempic and has had vomiting and nausea since he started it. I did advise that he needs to call the prescibing MD to discuss.  He will call that doc and ask about alternative medications

## 2023-04-09 NOTE — Telephone Encounter (Signed)
Pt called stating he have been throwing up since Thursday when he stated on  Ozempic. Pt want a call back with update Please.

## 2023-04-09 NOTE — Telephone Encounter (Signed)
Called pt he states he started on Ozempic last week. He states he hasn't vomited since Thursday, but dry heaving at time. Inform pt MD is out of the office until June 4th, Tried to make appt with covering providers pt states he would rather wait on DR. Plotnikov since he knows his history. Made appt for 6/10 inform pt if he continue to vomit pls call back to be seen sooner by one of the covering providers.Marland KitchenRaechel Chute

## 2023-04-22 ENCOUNTER — Ambulatory Visit: Payer: Medicare PPO | Admitting: Internal Medicine

## 2023-05-20 DIAGNOSIS — H401212 Low-tension glaucoma, right eye, moderate stage: Secondary | ICD-10-CM | POA: Diagnosis not present

## 2023-06-03 ENCOUNTER — Encounter: Payer: Self-pay | Admitting: Internal Medicine

## 2023-06-03 ENCOUNTER — Ambulatory Visit (INDEPENDENT_AMBULATORY_CARE_PROVIDER_SITE_OTHER): Payer: Medicare PPO | Admitting: Internal Medicine

## 2023-06-03 VITALS — BP 130/80 | HR 73 | Temp 98.0°F | Ht 65.0 in | Wt 209.0 lb

## 2023-06-03 DIAGNOSIS — I5032 Chronic diastolic (congestive) heart failure: Secondary | ICD-10-CM | POA: Diagnosis not present

## 2023-06-03 DIAGNOSIS — I480 Paroxysmal atrial fibrillation: Secondary | ICD-10-CM

## 2023-06-03 DIAGNOSIS — N182 Chronic kidney disease, stage 2 (mild): Secondary | ICD-10-CM

## 2023-06-03 DIAGNOSIS — G8929 Other chronic pain: Secondary | ICD-10-CM

## 2023-06-03 DIAGNOSIS — E559 Vitamin D deficiency, unspecified: Secondary | ICD-10-CM | POA: Diagnosis not present

## 2023-06-03 DIAGNOSIS — M5442 Lumbago with sciatica, left side: Secondary | ICD-10-CM

## 2023-06-03 DIAGNOSIS — M5441 Lumbago with sciatica, right side: Secondary | ICD-10-CM | POA: Diagnosis not present

## 2023-06-03 DIAGNOSIS — E669 Obesity, unspecified: Secondary | ICD-10-CM

## 2023-06-03 DIAGNOSIS — M519 Unspecified thoracic, thoracolumbar and lumbosacral intervertebral disc disorder: Secondary | ICD-10-CM | POA: Diagnosis not present

## 2023-06-03 DIAGNOSIS — E1169 Type 2 diabetes mellitus with other specified complication: Secondary | ICD-10-CM

## 2023-06-03 MED ORDER — OXYCODONE HCL 15 MG PO TABS
15.0000 mg | ORAL_TABLET | Freq: Four times a day (QID) | ORAL | 0 refills | Status: DC | PRN
Start: 2023-06-03 — End: 2023-07-03

## 2023-06-03 NOTE — Assessment & Plan Note (Signed)
Will increase Ozempic to 0.5 mg/d

## 2023-06-03 NOTE — Assessment & Plan Note (Signed)
Chronic Continue with oxycodone 15 mg 4 times a day as needed  Potential benefits of a long term opioids use as well as potential risks (i.e. addiction risk, apnea etc) and complications (i.e. Somnolence, constipation and others) were explained to the patient and were aknowledged. Continue with the weight loss effort

## 2023-06-03 NOTE — Assessment & Plan Note (Signed)
Cont on anticoagulation - Eliquis 

## 2023-06-03 NOTE — Assessment & Plan Note (Signed)
Loose wt NAS diet

## 2023-06-03 NOTE — Progress Notes (Signed)
Subjective:  Patient ID: Cody Frazier, male    DOB: 12-25-1945  Age: 77 y.o. MRN: [id number]  CC: Follow-up (2 [facility] F/U)   HPI Cody Frazier presents for LBP, DM, HTN  Outpatient Medications Prior to Visit  Medication Sig Dispense Refill   acetaminophen (TYLENOL) 500 MG tablet Take 1,000 mg by mouth every 6 (six) hours as needed for mild pain or moderate pain. Maximum daily dose of 3g     atorvastatin (LIPITOR) 20 MG tablet TAKE 1 TABLET BY MOUTH EVERY DAY 90 tablet 3   cetirizine (ZYRTEC) 10 MG tablet Take 10 mg by mouth daily.     Cholecalciferol (VITAMIN D3) 1.25 MG (50000 UT) CAPS TAKE 1 CAPSULE BY MOUTH MONTHLY 3 capsule 3   ELIQUIS 2.5 MG TABS tablet TAKE 1 TABLET BY MOUTH TWICE A DAY 180 tablet 3   famotidine (PEPCID) 40 MG tablet Take 1 tablet (40 mg total) by mouth at bedtime. 30 tablet 11   fluticasone (FLONASE) 50 MCG/ACT nasal spray SPRAY 2 SPRAYS INTO EACH NOSTRIL EVERY DAY 48 mL 3   furosemide (LASIX) 40 MG tablet Take 1 tablet (40 mg total) by mouth every other day. Take 40mg  every other day. May take an extra dose if your weight is over 212 pounds. 60 tablet 3   hyoscyamine (LEVSIN SL) 0.125 MG SL tablet Place 1-2 tablets (0.125-0.25 mg total) under the tongue every 4 (four) hours as needed for cramping. 120 tablet 1   latanoprost (XALATAN) 0.005 % ophthalmic solution Place 1 drop into both eyes at bedtime.  11   metoprolol succinate (TOPROL-XL) 25 MG 24 hr tablet TAKE 1 TABLET BY MOUTH EVERY DAY 90 tablet 3   pantoprazole (PROTONIX) 40 MG tablet Take 1 tablet (40 mg total) by mouth 2 (two) times daily. 60 tablet 11   Semaglutide,0.25 or 0.5MG /DOS, (OZEMPIC, 0.25 OR 0.5 MG/DOSE,) 2 MG/3ML SOPN Use 0.25 mg weekly sq for 1 month, then 0.5 mg sq weekly 9 mL 1   spironolactone (ALDACTONE) 25 MG tablet TAKE 1 TABLET BY MOUTH EVERY DAY 90 tablet 3   telmisartan (MICARDIS) 20 MG tablet Take 0.5 tablets (10 mg total) by mouth daily. 45 tablet 3   timolol (TIMOPTIC) 0.25 %  ophthalmic solution Place 1 drop into both eyes 2 (two) times daily.      triamcinolone cream (KENALOG) 0.1 % Apply 1 application topically 3 (three) times daily. 80 g 3   oxyCODONE (ROXICODONE) 15 MG immediate release tablet Take 1 tablet (15 mg total) by mouth every 6 (six) hours as needed for pain. 120 tablet 0   oxyCODONE (ROXICODONE) 15 MG immediate release tablet Take 1 tablet (15 mg total) by mouth every 6 (six) hours as needed for pain. 120 tablet 0   No facility-administered medications prior to visit.    ROS: Review of Systems  Constitutional:  Positive for fatigue and unexpected weight change. Negative for appetite change.  HENT:  Negative for congestion, nosebleeds, sneezing, sore throat and trouble swallowing.   Eyes:  Negative for itching and visual disturbance.  Respiratory:  Negative for cough.   Cardiovascular:  Negative for chest pain, palpitations and leg swelling.  Gastrointestinal:  Negative for abdominal distention, blood in stool, diarrhea and nausea.  Genitourinary:  Negative for frequency and hematuria.  Musculoskeletal:  Positive for back pain and gait problem. Negative for joint swelling and neck pain.  Skin:  Negative for color change and rash.  Neurological:  Negative for dizziness, tremors, speech  difficulty and weakness.  Hematological:  Bruises/bleeds easily.  Psychiatric/Behavioral:  Negative for agitation, dysphoric mood, sleep disturbance and suicidal ideas. The patient is not nervous/anxious.     Objective:  BP 130/80 (BP Location: Right Arm, Patient Position: Sitting, Cuff Size: Large)   Pulse 73   Temp 98 F (36.7 C) (Oral)   Ht 5\' 5"  (1.651 m)   Wt 209 lb (94.8 kg)   SpO2 97%   BMI 34.78 kg/m   BP Readings from Last 3 Encounters:  06/03/23 130/80  04/02/23 120/78  03/27/23 128/64    Wt Readings from Last 3 Encounters:  06/03/23 209 lb (94.8 kg)  04/02/23 216 lb (98 kg)  03/27/23 215 lb 3.2 oz (97.6 kg)    Physical  Exam Constitutional:      General: He is not in acute distress.    Appearance: He is well-developed.     Comments: NAD  Eyes:     Conjunctiva/sclera: Conjunctivae normal.     Pupils: Pupils are equal, round, and reactive to light.  Neck:     Thyroid: No thyromegaly.     Vascular: No JVD.  Cardiovascular:     Rate and Rhythm: Normal rate and regular rhythm.     Heart sounds: Normal heart sounds. No murmur heard.    No friction rub. No gallop.  Pulmonary:     Effort: Pulmonary effort is normal. No respiratory distress.     Breath sounds: Normal breath sounds. No wheezing or rales.  Chest:     Chest wall: No tenderness.  Abdominal:     General: Bowel sounds are normal. There is no distension.     Palpations: Abdomen is soft. There is no mass.     Tenderness: There is no abdominal tenderness. There is no guarding or rebound.  Musculoskeletal:        General: Tenderness present. Normal range of motion.     Cervical back: Normal range of motion.     Right lower leg: No edema.     Left lower leg: No edema.  Lymphadenopathy:     Cervical: No cervical adenopathy.  Skin:    General: Skin is warm and dry.     Findings: No rash.  Neurological:     Mental Status: He is alert and oriented to person, place, and time.     Cranial Nerves: No cranial nerve deficit.     Motor: No abnormal muscle tone.     Coordination: Coordination normal.     Gait: Gait normal.     Deep Tendon Reflexes: Reflexes are normal and symmetric.  Psychiatric:        Behavior: Behavior normal.        Thought Content: Thought content normal.        Judgment: Judgment normal.   LS w/pain; scoliosis  Lab Results  Component Value Date   WBC 6.0 02/04/2023   HGB 13.6 02/04/2023   HCT 39.4 02/04/2023   PLT 343 02/04/2023   GLUCOSE 100 (H) 02/04/2023   CHOL 196 11/20/2018   TRIG 269.0 (H) 11/20/2018   HDL 43.60 11/20/2018   LDLDIRECT 109.0 11/20/2018   LDLCALC 86 11/25/2017   ALT 12 01/10/2023   AST 14  01/10/2023   NA 141 02/04/2023   K 5.1 02/04/2023   CL 104 02/04/2023   CREATININE 1.23 02/04/2023   BUN 16 02/04/2023   CO2 21 02/04/2023   TSH 1.07 01/02/2023   PSA 0.16 11/20/2018   INR 1.1 08/12/2020  HGBA1C 5.8 01/02/2023    MR LUMBAR SPINE WO CONTRAST  Result Date: 06/04/2022 CLINICAL DATA:  Lumbar radiculopathy. Chronic mid and lower back pain with pain radiating into the shoulders. Prior lumbar surgery. EXAM: MRI LUMBAR SPINE WITHOUT CONTRAST TECHNIQUE: Multiplanar, multisequence MR imaging of the lumbar spine was performed. No intravenous contrast was administered. COMPARISON:  CTA chest, abdomen, and pelvis 05/13/2022. Lumbar myelogram 04/19/2008. FINDINGS: Segmentation:  Standard. Alignment: Thoracolumbar dextroscoliosis. Trace fused anterolisthesis of L5 on S1 and trace retrolisthesis of L1 on L2. Vertebrae: No fracture or suspicious marrow lesion. Prior L2-S1 posterior fusion. Interbody spacers at L2-3, L3-4, and L4-5 with interbody osseous fusion at these levels as well as at L1-2 and L5-S1. Prominent degenerative endplate edema at P32-95 as noted on today's separate thoracic spine MRI. Conus medullaris and cauda equina: Conus extends to the L1-2 level. Conus and cauda equina appear normal. Paraspinal and other soft tissues: Bilateral renal cysts. Postoperative changes in the posterior lumbar soft tissues. Fluid collection in the laminectomy bed from L3-S1 without mass effect on the thecal sac. Disc levels: Lower thoracic spine reported separately. T12-L1: Disc desiccation and severe disc space narrowing. Mild disc bulging and facet arthrosis without significant stenosis. L1-2: Prior posterior decompression. Retrolisthesis and endplate spurring without significant stenosis. L2-3: Prior posterior decompression and fusion.  No stenosis. L3-4: Prior posterior decompression and fusion.  No stenosis. L4-5: Prior posterior decompression and fusion. Widely patent spinal canal and left neural  foramen. The right L5 screw courses superior to the pedicle, traversing the inferior aspect of the right L4 neural foramen. L5-S1: Prior posterior decompression and fusion. Right eccentric disc bulging and endplate and facet spurring result in moderate right lateral recess and moderate right neural foraminal stenosis. No generalized spinal stenosis. Patent left neural foramen. IMPRESSION: 1. L2-S1 fusion. The right L5 screw traverses the inferior aspect of the right L4-5 neural foramen. 2. Moderate right lateral recess and right neural foraminal stenosis at L5-S1 due to spurring. Electronically Signed   By: Sebastian Ache M.D.   On: 06/04/2022 12:10   MR THORACIC SPINE WO CONTRAST  Result Date: 06/04/2022 CLINICAL DATA:  Lumbar radiculopathy. Chronic mid and lower back pain with pain radiating into the shoulders. Prior lumbar surgery. EXAM: MRI THORACIC SPINE WITHOUT CONTRAST TECHNIQUE: Multiplanar, multisequence MR imaging of the thoracic spine was performed. No intravenous contrast was administered. COMPARISON:  CTA chest, abdomen, and pelvis 05/13/2022. FINDINGS: Alignment: Increased upper thoracic kyphosis. Thoracolumbar dextroscoliosis. No significant listhesis. Vertebrae: No fracture or suspicious marrow lesion. Prominent degenerative endplate edema at J88-41. Mild degenerative endplate edema at Y60-63 and minimal edema at T6-7. Cord:  Normal signal. Paraspinal and other soft tissues: Bilateral renal cysts. Disc levels: Incompletely evaluated disc degeneration in the cervical spine including at C5-6 where a disc osteophyte complex may contact and mildly deform the spinal cord. Small left paracentral disc protrusion at T2-3 without significant spinal cord mass effect or stenosis. Mild disc bulging in the midthoracic spine without stenosis. At T10-11, disc bulging, a right subarticular to foraminal disc protrusion, and asymmetrically severe right facet arthrosis result in mild spinal stenosis and severe right  neural foraminal stenosis. At T11-12, disc bulging and facet arthrosis result in borderline spinal stenosis without neural foraminal stenosis. IMPRESSION: 1. Multilevel thoracic disc and facet degeneration, most notable at T10-11 where there is mild spinal stenosis and severe right neural foraminal stenosis. 2. Prominent degenerative endplate edema at K16-01. Electronically Signed   By: Sebastian Ache M.D.   On: 06/04/2022 11:43  Assessment & Plan:   Problem List Items Addressed This Visit     Lumbar disc disease - Primary    Chronic Continue with oxycodone 15 mg 4 times a day as needed  Potential benefits of a long term opioids use as well as potential risks (i.e. addiction risk, apnea etc) and complications (i.e. Somnolence, constipation and others) were explained to the patient and were aknowledged. Continue with the weight loss effort      Paroxysmal atrial fibrillation (HCC)    Cont on anticoagulation - Eliquis      Vitamin D deficiency    On Vit D      CRI (chronic renal insufficiency), stage 2 (mild)    Hydrate well Monitor GFR      Relevant Medications   oxyCODONE (ROXICODONE) 15 MG immediate release tablet   oxyCODONE (ROXICODONE) 15 MG immediate release tablet   Chronic diastolic CHF (congestive heart failure) (HCC)    Loose wt NAS diet      Type 2 diabetes mellitus with obesity (HCC)    Will increase Ozempic to 0.5 mg/d      Relevant Medications   oxyCODONE (ROXICODONE) 15 MG immediate release tablet   oxyCODONE (ROXICODONE) 15 MG immediate release tablet   Low back pain    Discussed.  I would not pursue weight loss and better diabetes control before we consider invasive procedures.  Cont on Oxycodone  Potential benefits of a long term opioids use as well as potential risks (i.e. addiction risk, apnea etc) and complications (i.e. Somnolence, constipation and others) were explained to the patient and were aknowledged.      Relevant Medications   oxyCODONE  (ROXICODONE) 15 MG immediate release tablet   oxyCODONE (ROXICODONE) 15 MG immediate release tablet      Meds ordered this encounter  Medications   oxyCODONE (ROXICODONE) 15 MG immediate release tablet    Sig: Take 1 tablet (15 mg total) by mouth every 6 (six) hours as needed for pain.    Dispense:  120 tablet    Refill:  0    Please fill on or after 06/04/23   oxyCODONE (ROXICODONE) 15 MG immediate release tablet    Sig: Take 1 tablet (15 mg total) by mouth every 6 (six) hours as needed for pain.    Dispense:  120 tablet    Refill:  0    Please fill on or after 07/04/23      Follow-up: Return in about 3 months (around 09/03/2023) for a follow-up visit.  Sonda Primes, MD

## 2023-06-03 NOTE — Assessment & Plan Note (Signed)
On Vit D 

## 2023-06-03 NOTE — Assessment & Plan Note (Signed)
Discussed.  I would not pursue weight loss and better diabetes control before we consider invasive procedures.  Cont on Oxycodone  Potential benefits of a long term opioids use as well as potential risks (i.e. addiction risk, apnea etc) and complications (i.e. Somnolence, constipation and others) were explained to the patient and were aknowledged.

## 2023-06-03 NOTE — Assessment & Plan Note (Signed)
Hydrate well ?Monitor GFR ?

## 2023-06-04 ENCOUNTER — Ambulatory Visit: Payer: Medicare PPO | Admitting: Internal Medicine

## 2023-06-24 DIAGNOSIS — G4733 Obstructive sleep apnea (adult) (pediatric): Secondary | ICD-10-CM | POA: Diagnosis not present

## 2023-07-03 ENCOUNTER — Ambulatory Visit (INDEPENDENT_AMBULATORY_CARE_PROVIDER_SITE_OTHER): Payer: Medicare PPO | Admitting: Internal Medicine

## 2023-07-03 ENCOUNTER — Encounter: Payer: Self-pay | Admitting: Internal Medicine

## 2023-07-03 VITALS — BP 118/80 | HR 79 | Temp 98.6°F | Ht 65.0 in | Wt 208.0 lb

## 2023-07-03 DIAGNOSIS — G8929 Other chronic pain: Secondary | ICD-10-CM

## 2023-07-03 DIAGNOSIS — M519 Unspecified thoracic, thoracolumbar and lumbosacral intervertebral disc disorder: Secondary | ICD-10-CM | POA: Diagnosis not present

## 2023-07-03 DIAGNOSIS — E669 Obesity, unspecified: Secondary | ICD-10-CM | POA: Diagnosis not present

## 2023-07-03 DIAGNOSIS — F419 Anxiety disorder, unspecified: Secondary | ICD-10-CM

## 2023-07-03 DIAGNOSIS — R1901 Right upper quadrant abdominal swelling, mass and lump: Secondary | ICD-10-CM | POA: Diagnosis not present

## 2023-07-03 DIAGNOSIS — M5442 Lumbago with sciatica, left side: Secondary | ICD-10-CM

## 2023-07-03 DIAGNOSIS — M5441 Lumbago with sciatica, right side: Secondary | ICD-10-CM | POA: Diagnosis not present

## 2023-07-03 DIAGNOSIS — Z7985 Long-term (current) use of injectable non-insulin antidiabetic drugs: Secondary | ICD-10-CM

## 2023-07-03 DIAGNOSIS — E1169 Type 2 diabetes mellitus with other specified complication: Secondary | ICD-10-CM

## 2023-07-03 DIAGNOSIS — R109 Unspecified abdominal pain: Secondary | ICD-10-CM

## 2023-07-03 DIAGNOSIS — K56600 Partial intestinal obstruction, unspecified as to cause: Secondary | ICD-10-CM

## 2023-07-03 LAB — URINALYSIS
Bilirubin Urine: NEGATIVE
Ketones, ur: NEGATIVE
Leukocytes,Ua: NEGATIVE
Nitrite: NEGATIVE
Specific Gravity, Urine: 1.015 (ref 1.000–1.030)
Total Protein, Urine: NEGATIVE
Urine Glucose: NEGATIVE
Urobilinogen, UA: 1 (ref 0.0–1.0)
pH: 6 (ref 5.0–8.0)

## 2023-07-03 LAB — COMPREHENSIVE METABOLIC PANEL
ALT: 14 U/L (ref 0–53)
AST: 14 U/L (ref 0–37)
Albumin: 4.3 g/dL (ref 3.5–5.2)
Alkaline Phosphatase: 84 U/L (ref 39–117)
BUN: 14 mg/dL (ref 6–23)
CO2: 27 meq/L (ref 19–32)
Calcium: 10.1 mg/dL (ref 8.4–10.5)
Chloride: 105 meq/L (ref 96–112)
Creatinine, Ser: 1.05 mg/dL (ref 0.40–1.50)
GFR: 68.33 mL/min (ref >60.00)
Glucose, Bld: 107 mg/dL — ABNORMAL HIGH (ref 70–99)
Potassium: 4.1 meq/L (ref 3.5–5.1)
Sodium: 140 meq/L (ref 135–145)
Total Bilirubin: 0.6 mg/dL (ref 0.2–1.2)
Total Protein: 7.2 g/dL (ref 6.0–8.3)

## 2023-07-03 LAB — LIPASE: Lipase: 18 U/L (ref 11.0–59.0)

## 2023-07-03 LAB — CBC WITH DIFFERENTIAL/PLATELET
Basophils Absolute: 0 10*3/uL (ref 0.0–0.1)
Basophils Relative: 0.6 % (ref 0.0–3.0)
Eosinophils Absolute: 0.1 10*3/uL (ref 0.0–0.7)
Eosinophils Relative: 1.4 % (ref 0.0–5.0)
HCT: 44 % (ref 39.0–52.0)
Hemoglobin: 14.3 g/dL (ref 13.0–17.0)
Lymphocytes Relative: 35.4 % (ref 12.0–46.0)
Lymphs Abs: 2.4 10*3/uL (ref 0.7–4.0)
MCHC: 32.4 g/dL (ref 30.0–36.0)
MCV: 96.3 fl (ref 78.0–100.0)
Monocytes Absolute: 0.5 10*3/uL (ref 0.1–1.0)
Monocytes Relative: 7.7 % (ref 3.0–12.0)
Neutro Abs: 3.8 10*3/uL (ref 1.4–7.7)
Neutrophils Relative %: 54.9 % (ref 43.0–77.0)
Platelets: 321 10*3/uL (ref 150.0–400.0)
RBC: 4.58 Mil/uL (ref 4.22–5.81)
RDW: 13.3 % (ref 11.5–15.5)
WBC: 6.9 10*3/uL (ref 4.0–10.5)

## 2023-07-03 LAB — SEDIMENTATION RATE: Sed Rate: 28 mm/h — ABNORMAL HIGH (ref 0–20)

## 2023-07-03 MED ORDER — OXYCODONE HCL 30 MG PO TABS: 30.0000 mg | ORAL_TABLET | Freq: Three times a day (TID) | ORAL | 0 refills | Status: DC | PRN

## 2023-07-03 MED ORDER — SEMAGLUTIDE (1 MG/DOSE) 4 MG/3ML ~~LOC~~ SOPN: 1.0000 mg | PEN_INJECTOR | SUBCUTANEOUS | 3 refills | Status: DC

## 2023-07-03 NOTE — Progress Notes (Signed)
Subjective:  Patient ID: Cody Frazier, male    DOB: [birth year]  Age: 77 y.o. MRN: [id number]  CC: Follow-up   HPI Cody Frazier presents for LBP - worse; pain is 8/10.  He is miserable all the time  - oxycodone does not last for more than 3 hours lately. C/o pain in the R flank through the front - worse C/o RUQ bulge, pain.  F/u DM  Outpatient Medications Prior to Visit  Medication Sig Dispense Refill   acetaminophen (TYLENOL) 500 MG tablet Take 1,000 mg by mouth every 6 (six) hours as needed for mild pain or moderate pain. Maximum daily dose of 3g     atorvastatin (LIPITOR) 20 MG tablet TAKE 1 TABLET BY MOUTH EVERY DAY 90 tablet 3   cetirizine (ZYRTEC) 10 MG tablet Take 10 mg by mouth daily.     Cholecalciferol (VITAMIN D3) 1.25 MG (50000 UT) CAPS TAKE 1 CAPSULE BY MOUTH MONTHLY 3 capsule 3   ELIQUIS 2.5 MG TABS tablet TAKE 1 TABLET BY MOUTH TWICE A DAY 180 tablet 3   famotidine (PEPCID) 40 MG tablet Take 1 tablet (40 mg total) by mouth at bedtime. 30 tablet 11   fluticasone (FLONASE) 50 MCG/ACT nasal spray SPRAY 2 SPRAYS INTO EACH NOSTRIL EVERY DAY 48 mL 3   furosemide (LASIX) 40 MG tablet Take 1 tablet (40 mg total) by mouth every other day. Take 40mg  every other day. May take an extra dose if your weight is over 212 pounds. 60 tablet 3   hyoscyamine (LEVSIN SL) 0.125 MG SL tablet Place 1-2 tablets (0.125-0.25 mg total) under the tongue every 4 (four) hours as needed for cramping. 120 tablet 1   latanoprost (XALATAN) 0.005 % ophthalmic solution Place 1 drop into both eyes at bedtime.  11   metoprolol succinate (TOPROL-XL) 25 MG 24 hr tablet TAKE 1 TABLET BY MOUTH EVERY DAY 90 tablet 3   pantoprazole (PROTONIX) 40 MG tablet Take 1 tablet (40 mg total) by mouth 2 (two) times daily. 60 tablet 11   spironolactone (ALDACTONE) 25 MG tablet TAKE 1 TABLET BY MOUTH EVERY DAY 90 tablet 3   telmisartan (MICARDIS) 20 MG tablet Take 0.5 tablets (10 mg total) by mouth daily. 45 tablet 3    timolol (TIMOPTIC) 0.25 % ophthalmic solution Place 1 drop into both eyes 2 (two) times daily.      triamcinolone cream (KENALOG) 0.1 % Apply 1 application topically 3 (three) times daily. 80 g 3   oxyCODONE (ROXICODONE) 15 MG immediate release tablet Take 1 tablet (15 mg total) by mouth every 6 (six) hours as needed for pain. 120 tablet 0   oxyCODONE (ROXICODONE) 15 MG immediate release tablet Take 1 tablet (15 mg total) by mouth every 6 (six) hours as needed for pain. 120 tablet 0   Semaglutide,0.25 or 0.5MG /DOS, (OZEMPIC, 0.25 OR 0.5 MG/DOSE,) 2 MG/3ML SOPN Use 0.25 mg weekly sq for 1 month, then 0.5 mg sq weekly 9 mL 1   No facility-administered medications prior to visit.    ROS: Review of Systems  Constitutional:  Negative for appetite change, fatigue and unexpected weight change.  HENT:  Negative for congestion, nosebleeds, sneezing, sore throat and trouble swallowing.   Eyes:  Negative for itching and visual disturbance.  Respiratory:  Negative for cough.   Cardiovascular:  Positive for leg swelling. Negative for chest pain and palpitations.  Gastrointestinal:  Negative for abdominal distention, blood in stool, diarrhea and nausea.  Genitourinary:  Negative for  frequency and hematuria.  Musculoskeletal:  Positive for arthralgias, back pain and gait problem. Negative for joint swelling and neck pain.  Skin:  Negative for rash.  Neurological:  Negative for dizziness, tremors, speech difficulty and weakness.  Psychiatric/Behavioral:  Negative for agitation, decreased concentration, dysphoric mood, sleep disturbance and suicidal ideas. The patient is not nervous/anxious.     Objective:  BP 118/80 (BP Location: Left Arm, Patient Position: Sitting, Cuff Size: Large)   Pulse 79   Temp 98.6 F (37 C) (Oral)   Ht 5\' 5"  (1.651 m)   Wt 208 lb (94.3 kg)   SpO2 96%   BMI 34.61 kg/m   BP Readings from Last 3 Encounters:  07/03/23 118/80  06/03/23 130/80  04/02/23 120/78    Wt  Readings from Last 3 Encounters:  07/03/23 208 lb (94.3 kg)  06/03/23 209 lb (94.8 kg)  04/02/23 216 lb (98 kg)    Physical Exam Constitutional:      General: He is not in acute distress.    Appearance: He is well-developed. He is obese.     Comments: NAD  Eyes:     Conjunctiva/sclera: Conjunctivae normal.     Pupils: Pupils are equal, round, and reactive to light.  Neck:     Thyroid: No thyromegaly.     Vascular: No JVD.  Cardiovascular:     Rate and Rhythm: Normal rate and regular rhythm.     Heart sounds: Normal heart sounds. No murmur heard.    No friction rub. No gallop.  Pulmonary:     Effort: Pulmonary effort is normal. No respiratory distress.     Breath sounds: Normal breath sounds. No wheezing or rales.  Chest:     Chest wall: No tenderness.  Abdominal:     General: Bowel sounds are normal. There is no distension.     Palpations: Abdomen is soft. There is no mass.     Tenderness: There is abdominal tenderness. There is no guarding or rebound.  Musculoskeletal:        General: No tenderness. Normal range of motion.     Cervical back: Normal range of motion.     Right lower leg: No edema.     Left lower leg: No edema.  Lymphadenopathy:     Cervical: No cervical adenopathy.  Skin:    General: Skin is warm and dry.     Findings: No rash.  Neurological:     Mental Status: He is alert and oriented to person, place, and time.     Cranial Nerves: No cranial nerve deficit.     Motor: No abnormal muscle tone.     Coordination: Coordination normal.     Gait: Gait normal.     Deep Tendon Reflexes: Reflexes are normal and symmetric.  Psychiatric:        Behavior: Behavior normal.        Thought Content: Thought content normal.        Judgment: Judgment normal.   Antalgic gait Painful lumbar and thoracic spine Kyphosis and scoliosis Posture difficulties Using a cane  Lab Results  Component Value Date   WBC 6.9 07/03/2023   HGB 14.3 07/03/2023   HCT 44.0  07/03/2023   PLT 321.0 07/03/2023   GLUCOSE 107 (H) 07/03/2023   CHOL 196 11/20/2018   TRIG 269.0 (H) 11/20/2018   HDL 43.60 11/20/2018   LDLDIRECT 109.0 11/20/2018   LDLCALC 86 11/25/2017   ALT 14 07/03/2023   AST 14 07/03/2023   NA  140 07/03/2023   K 4.1 07/03/2023   CL 105 07/03/2023   CREATININE 1.05 07/03/2023   BUN 14 07/03/2023   CO2 27 07/03/2023   TSH 0.83 07/03/2023   PSA 0.16 11/20/2018   INR 1.1 08/12/2020   HGBA1C 5.8 01/02/2023    MR LUMBAR SPINE WO CONTRAST  Result Date: 06/04/2022 CLINICAL DATA:  Lumbar radiculopathy. Chronic mid and lower back pain with pain radiating into the shoulders. Prior lumbar surgery. EXAM: MRI LUMBAR SPINE WITHOUT CONTRAST TECHNIQUE: Multiplanar, multisequence MR imaging of the lumbar spine was performed. No intravenous contrast was administered. COMPARISON:  CTA chest, abdomen, and pelvis 05/13/2022. Lumbar myelogram 04/19/2008. FINDINGS: Segmentation:  Standard. Alignment: Thoracolumbar dextroscoliosis. Trace fused anterolisthesis of L5 on S1 and trace retrolisthesis of L1 on L2. Vertebrae: No fracture or suspicious marrow lesion. Prior L2-S1 posterior fusion. Interbody spacers at L2-3, L3-4, and L4-5 with interbody osseous fusion at these levels as well as at L1-2 and L5-S1. Prominent degenerative endplate edema at W09-81 as noted on today's separate thoracic spine MRI. Conus medullaris and cauda equina: Conus extends to the L1-2 level. Conus and cauda equina appear normal. Paraspinal and other soft tissues: Bilateral renal cysts. Postoperative changes in the posterior lumbar soft tissues. Fluid collection in the laminectomy bed from L3-S1 without mass effect on the thecal sac. Disc levels: Lower thoracic spine reported separately. T12-L1: Disc desiccation and severe disc space narrowing. Mild disc bulging and facet arthrosis without significant stenosis. L1-2: Prior posterior decompression. Retrolisthesis and endplate spurring without significant  stenosis. L2-3: Prior posterior decompression and fusion.  No stenosis. L3-4: Prior posterior decompression and fusion.  No stenosis. L4-5: Prior posterior decompression and fusion. Widely patent spinal canal and left neural foramen. The right L5 screw courses superior to the pedicle, traversing the inferior aspect of the right L4 neural foramen. L5-S1: Prior posterior decompression and fusion. Right eccentric disc bulging and endplate and facet spurring result in moderate right lateral recess and moderate right neural foraminal stenosis. No generalized spinal stenosis. Patent left neural foramen. IMPRESSION: 1. L2-S1 fusion. The right L5 screw traverses the inferior aspect of the right L4-5 neural foramen. 2. Moderate right lateral recess and right neural foraminal stenosis at L5-S1 due to spurring. Electronically Signed   By: Sebastian Ache M.D.   On: 06/04/2022 12:10   MR THORACIC SPINE WO CONTRAST  Result Date: 06/04/2022 CLINICAL DATA:  Lumbar radiculopathy. Chronic mid and lower back pain with pain radiating into the shoulders. Prior lumbar surgery. EXAM: MRI THORACIC SPINE WITHOUT CONTRAST TECHNIQUE: Multiplanar, multisequence MR imaging of the thoracic spine was performed. No intravenous contrast was administered. COMPARISON:  CTA chest, abdomen, and pelvis 05/13/2022. FINDINGS: Alignment: Increased upper thoracic kyphosis. Thoracolumbar dextroscoliosis. No significant listhesis. Vertebrae: No fracture or suspicious marrow lesion. Prominent degenerative endplate edema at X91-47. Mild degenerative endplate edema at W29-56 and minimal edema at T6-7. Cord:  Normal signal. Paraspinal and other soft tissues: Bilateral renal cysts. Disc levels: Incompletely evaluated disc degeneration in the cervical spine including at C5-6 where a disc osteophyte complex may contact and mildly deform the spinal cord. Small left paracentral disc protrusion at T2-3 without significant spinal cord mass effect or stenosis. Mild  disc bulging in the midthoracic spine without stenosis. At T10-11, disc bulging, a right subarticular to foraminal disc protrusion, and asymmetrically severe right facet arthrosis result in mild spinal stenosis and severe right neural foraminal stenosis. At T11-12, disc bulging and facet arthrosis result in borderline spinal stenosis without neural foraminal stenosis. IMPRESSION:  1. Multilevel thoracic disc and facet degeneration, most notable at T10-11 where there is mild spinal stenosis and severe right neural foraminal stenosis. 2. Prominent degenerative endplate edema at Z61-09. Electronically Signed   By: Sebastian Ache M.D.   On: 06/04/2022 11:43    Assessment & Plan:   Problem List Items Addressed This Visit     Anxiety disorder - Primary    The uncontrolled pain is aggravating.  On Buspar.  Treat pain      Lumbar disc disease    Worsening pain.  The patient has developed tolerance to oxycodone.  Will increase oxycodone dose to 30 mg 3 times daily Narcan to have at home Continue with weight loss      Type 2 diabetes mellitus with obesity (HCC)    Continue with Ozempic.  Increase the dose      Relevant Medications   Semaglutide, 1 MG/DOSE, 4 MG/3ML SOPN   Low back pain    Worsening pain.  The patient has developed tolerance to oxycodone.  Will increase oxycodone dose to 30 mg 3 times daily Narcan to have at home Continue with weight loss      Relevant Medications   oxycodone (ROXICODONE) 30 MG immediate release tablet   Other Visit Diagnoses     Partial intestinal obstruction, unspecified cause (HCC)       Relevant Orders   CT ABDOMEN PELVIS W CONTRAST   CBC with Differential/Platelet (Completed)   Right flank pain       Relevant Orders   CBC with Differential/Platelet (Completed)   Urinalysis (Completed)   Sedimentation rate (Completed)   RUQ abdominal mass       Relevant Orders   CBC with Differential/Platelet (Completed)   Comprehensive metabolic panel (Completed)    Urinalysis (Completed)   TSH (Completed)   Sedimentation rate (Completed)   Lipase (Completed)         Meds ordered this encounter  Medications   oxycodone (ROXICODONE) 30 MG immediate release tablet    Sig: Take 1 tablet (30 mg total) by mouth every 8 (eight) hours as needed for pain.    Dispense:  90 tablet    Refill:  0    Please fill on or after 07/03/23 - dose change   Semaglutide, 1 MG/DOSE, 4 MG/3ML SOPN    Sig: Inject 1 mg into the skin once a week.    Dispense:  3 mL    Refill:  3   naloxone (NARCAN) 0.4 MG/ML injection    Sig: Use prn if severe oversedation with oxycodone      Follow-up: Return in about 4 weeks (around 07/31/2023) for a follow-up visit.  Sonda Primes, MD

## 2023-07-03 NOTE — Assessment & Plan Note (Addendum)
The uncontrolled pain is aggravating.  On Buspar.  Treat pain

## 2023-07-05 LAB — TSH: TSH: 0.83 u[IU]/mL (ref 0.35–5.50)

## 2023-07-07 MED ORDER — NALOXONE HCL 0.4 MG/ML IJ SOLN: INTRAMUSCULAR | Status: AC

## 2023-07-07 NOTE — Assessment & Plan Note (Signed)
Continue with Ozempic.  Increase the dose

## 2023-07-07 NOTE — Assessment & Plan Note (Signed)
Worsening pain.  The patient has developed tolerance to oxycodone.  Will increase oxycodone dose to 30 mg 3 times daily Narcan to have at home Continue with weight loss

## 2023-07-09 ENCOUNTER — Encounter: Payer: Self-pay | Admitting: Internal Medicine

## 2023-07-09 ENCOUNTER — Ambulatory Visit
Admission: RE | Admit: 2023-07-09 | Discharge: 2023-07-09 | Disposition: A | Discharge status: Home / Self Care | Payer: Medicare PPO | Source: Ambulatory Visit | Attending: Internal Medicine | Admitting: Internal Medicine

## 2023-07-09 DIAGNOSIS — K56600 Partial intestinal obstruction, unspecified as to cause: Secondary | ICD-10-CM | POA: Diagnosis not present

## 2023-07-09 MED ORDER — IOPAMIDOL (ISOVUE-300) INJECTION 61%
500.0000 mL | Freq: Once | INTRAVENOUS | Status: AC | PRN
  Administered 2023-07-09: 100 mL via INTRAVENOUS

## 2023-07-16 ENCOUNTER — Telehealth: Payer: Self-pay | Admitting: Internal Medicine

## 2023-07-16 ENCOUNTER — Other Ambulatory Visit: Payer: Self-pay

## 2023-07-16 DIAGNOSIS — E1169 Type 2 diabetes mellitus with other specified complication: Secondary | ICD-10-CM

## 2023-07-16 MED ORDER — SEMAGLUTIDE (1 MG/DOSE) 4 MG/3ML ~~LOC~~ SOPN: 1.0000 mg | PEN_INJECTOR | SUBCUTANEOUS | 3 refills | Status: DC

## 2023-07-16 NOTE — Telephone Encounter (Signed)
Patient wants 1.0 of ozempic called in to his pharmacy - [facility], [employer].  Patient thought that Dr. Macario Golds was going to call this in.  Please advise.

## 2023-07-17 NOTE — Telephone Encounter (Signed)
Rx has been sent in to pts pharmacy.  Receipt confirmed by pharmacy (07/16/2023  2:32 PM EDT)

## 2023-07-19 ENCOUNTER — Other Ambulatory Visit: Payer: Self-pay | Admitting: Nurse Practitioner

## 2023-07-19 DIAGNOSIS — R935 Abnormal findings on diagnostic imaging of other abdominal regions, including retroperitoneum: Secondary | ICD-10-CM

## 2023-07-19 DIAGNOSIS — N200 Calculus of kidney: Secondary | ICD-10-CM

## 2023-07-19 DIAGNOSIS — K838 Other specified diseases of biliary tract: Secondary | ICD-10-CM

## 2023-07-22 ENCOUNTER — Other Ambulatory Visit: Payer: Medicare PPO

## 2023-07-31 ENCOUNTER — Encounter: Payer: Self-pay | Admitting: Internal Medicine

## 2023-07-31 ENCOUNTER — Ambulatory Visit (INDEPENDENT_AMBULATORY_CARE_PROVIDER_SITE_OTHER): Payer: Medicare PPO | Admitting: Internal Medicine

## 2023-07-31 VITALS — BP 110/62 | HR 84 | Temp 98.6°F | Wt 207.0 lb

## 2023-07-31 DIAGNOSIS — M5442 Lumbago with sciatica, left side: Secondary | ICD-10-CM | POA: Diagnosis not present

## 2023-07-31 DIAGNOSIS — M519 Unspecified thoracic, thoracolumbar and lumbosacral intervertebral disc disorder: Secondary | ICD-10-CM | POA: Diagnosis not present

## 2023-07-31 DIAGNOSIS — Z6835 Body mass index (BMI) 35.0-35.9, adult: Secondary | ICD-10-CM

## 2023-07-31 DIAGNOSIS — G8929 Other chronic pain: Secondary | ICD-10-CM | POA: Diagnosis not present

## 2023-07-31 DIAGNOSIS — I10 Essential (primary) hypertension: Secondary | ICD-10-CM

## 2023-07-31 DIAGNOSIS — I480 Paroxysmal atrial fibrillation: Secondary | ICD-10-CM

## 2023-07-31 DIAGNOSIS — Z23 Encounter for immunization: Secondary | ICD-10-CM

## 2023-07-31 DIAGNOSIS — M5441 Lumbago with sciatica, right side: Secondary | ICD-10-CM | POA: Diagnosis not present

## 2023-07-31 MED ORDER — OXYCODONE HCL 30 MG PO TABS: 30.0000 mg | ORAL_TABLET | Freq: Three times a day (TID) | ORAL | 0 refills | Status: DC | PRN

## 2023-07-31 NOTE — Progress Notes (Signed)
Subjective:  Patient ID: Cody Frazier, male    DOB: 15-Sep-1946  Age: 77 y.o. MRN: [id number]  CC: Follow-up (4 WEEK F/U)   HPI DALTON CURTISS presents for chronic LBP - 75% better on a new dose of Oxycodone Pt lost 1 lb F/u on DM, obesity  Outpatient Medications Prior to Visit  Medication Sig Dispense Refill   acetaminophen (TYLENOL) 500 MG tablet Take 1,000 mg by mouth every 6 (six) hours as needed for mild pain or moderate pain. Maximum daily dose of 3g     atorvastatin (LIPITOR) 20 MG tablet TAKE 1 TABLET BY MOUTH EVERY DAY 90 tablet 3   cetirizine (ZYRTEC) 10 MG tablet Take 10 mg by mouth daily.     Cholecalciferol (VITAMIN D3) 1.25 MG (50000 UT) CAPS TAKE 1 CAPSULE BY MOUTH MONTHLY 3 capsule 3   ELIQUIS 2.5 MG TABS tablet TAKE 1 TABLET BY MOUTH TWICE A DAY 180 tablet 3   famotidine (PEPCID) 40 MG tablet Take 1 tablet (40 mg total) by mouth at bedtime. 30 tablet 11   fluticasone (FLONASE) 50 MCG/ACT nasal spray SPRAY 2 SPRAYS INTO EACH NOSTRIL EVERY DAY 48 mL 3   furosemide (LASIX) 40 MG tablet Take 1 tablet (40 mg total) by mouth every other day. Take 40mg  every other day. May take an extra dose if your weight is over 212 pounds. 60 tablet 3   hyoscyamine (LEVSIN SL) 0.125 MG SL tablet Place 1-2 tablets (0.125-0.25 mg total) under the tongue every 4 (four) hours as needed for cramping. 120 tablet 1   latanoprost (XALATAN) 0.005 % ophthalmic solution Place 1 drop into both eyes at bedtime.  11   metoprolol succinate (TOPROL-XL) 25 MG 24 hr tablet TAKE 1 TABLET BY MOUTH EVERY DAY 90 tablet 3   naloxone (NARCAN) 0.4 MG/ML injection Use prn if severe oversedation with oxycodone     pantoprazole (PROTONIX) 40 MG tablet Take 1 tablet (40 mg total) by mouth 2 (two) times daily. 60 tablet 11   Semaglutide, 1 MG/DOSE, 4 MG/3ML SOPN Inject 1 mg into the skin once a week. 3 mL 3   spironolactone (ALDACTONE) 25 MG tablet TAKE 1 TABLET BY MOUTH EVERY DAY 90 tablet 3   telmisartan  (MICARDIS) 20 MG tablet Take 0.5 tablets (10 mg total) by mouth daily. 45 tablet 3   timolol (TIMOPTIC) 0.25 % ophthalmic solution Place 1 drop into both eyes 2 (two) times daily.      triamcinolone cream (KENALOG) 0.1 % Apply 1 application topically 3 (three) times daily. 80 g 3   oxycodone (ROXICODONE) 30 MG immediate release tablet Take 1 tablet (30 mg total) by mouth every 8 (eight) hours as needed for pain. 90 tablet 0   No facility-administered medications prior to visit.    ROS: Review of Systems  Constitutional:  Positive for fatigue. Negative for appetite change and unexpected weight change.  HENT:  Negative for congestion, nosebleeds, sneezing, sore throat and trouble swallowing.   Eyes:  Negative for itching and visual disturbance.  Respiratory:  Negative for cough.   Cardiovascular:  Negative for chest pain, palpitations and leg swelling.  Gastrointestinal:  Negative for abdominal distention, blood in stool, diarrhea and nausea.  Genitourinary:  Negative for frequency and hematuria.  Musculoskeletal:  Positive for back pain and gait problem. Negative for joint swelling and neck pain.  Skin:  Negative for rash.  Neurological:  Negative for dizziness, tremors, speech difficulty and weakness.  Hematological:  Bruises/bleeds easily.  Psychiatric/Behavioral:  Negative for agitation, dysphoric mood, sleep disturbance and suicidal ideas. The patient is nervous/anxious.     Objective:  BP 110/62 (BP Location: Right Arm, Patient Position: Sitting, Cuff Size: Large)   Pulse 84   Temp 98.6 F (37 C) (Oral)   Wt 207 lb (93.9 kg)   SpO2 95%   BMI 34.45 kg/m   BP Readings from Last 3 Encounters:  07/31/23 110/62  07/03/23 118/80  06/03/23 130/80    Wt Readings from Last 3 Encounters:  07/31/23 207 lb (93.9 kg)  07/03/23 208 lb (94.3 kg)  06/03/23 209 lb (94.8 kg)    Physical Exam Constitutional:      General: He is not in acute distress.    Appearance: He is  well-developed. He is obese.     Comments: NAD  Eyes:     Conjunctiva/sclera: Conjunctivae normal.     Pupils: Pupils are equal, round, and reactive to light.  Neck:     Thyroid: No thyromegaly.     Vascular: No JVD.  Cardiovascular:     Rate and Rhythm: Normal rate and regular rhythm.     Heart sounds: Normal heart sounds. No murmur heard.    No friction rub. No gallop.  Pulmonary:     Effort: Pulmonary effort is normal. No respiratory distress.     Breath sounds: Normal breath sounds. No wheezing or rales.  Chest:     Chest wall: No tenderness.  Abdominal:     General: Bowel sounds are normal. There is no distension.     Palpations: Abdomen is soft. There is no mass.     Tenderness: There is no abdominal tenderness. There is no guarding or rebound.  Musculoskeletal:        General: Tenderness present. Normal range of motion.     Cervical back: Normal range of motion.  Lymphadenopathy:     Cervical: No cervical adenopathy.  Skin:    General: Skin is warm and dry.     Findings: No rash.  Neurological:     Mental Status: He is alert and oriented to person, place, and time.     Cranial Nerves: No cranial nerve deficit.     Motor: Weakness present. No abnormal muscle tone.     Coordination: Coordination abnormal.     Gait: Gait abnormal.     Deep Tendon Reflexes: Reflexes are normal and symmetric.  Psychiatric:        Behavior: Behavior normal.        Thought Content: Thought content normal.        Judgment: Judgment normal.   Antalgic gait Using a cane LS w/pain  Lab Results  Component Value Date   WBC 6.9 07/03/2023   HGB 14.3 07/03/2023   HCT 44.0 07/03/2023   PLT 321.0 07/03/2023   GLUCOSE 107 (H) 07/03/2023   CHOL 196 11/20/2018   TRIG 269.0 (H) 11/20/2018   HDL 43.60 11/20/2018   LDLDIRECT 109.0 11/20/2018   LDLCALC 86 11/25/2017   ALT 14 07/03/2023   AST 14 07/03/2023   NA 140 07/03/2023   K 4.1 07/03/2023   CL 105 07/03/2023   CREATININE 1.05  07/03/2023   BUN 14 07/03/2023   CO2 27 07/03/2023   TSH 0.83 07/03/2023   PSA 0.16 11/20/2018   INR 1.1 08/12/2020   HGBA1C 5.8 01/02/2023    CT ABDOMEN PELVIS W CONTRAST  Result Date: 07/19/2023 CLINICAL DATA:  Nausea.  Right upper quadrant pain. EXAM: CT ABDOMEN AND  PELVIS WITH CONTRAST TECHNIQUE: Multidetector CT imaging of the abdomen and pelvis was performed using the standard protocol following bolus administration of intravenous contrast. RADIATION DOSE REDUCTION: This exam was performed according to the departmental dose-optimization program which includes automated exposure control, adjustment of the mA and/or kV according to patient size and/or use of iterative reconstruction technique. CONTRAST:  ISOVUE-300 IOPAMIDOL (ISOVUE-300) INJECTION 61% COMPARISON:  05/13/2022 FINDINGS: Lower chest: No acute findings. Mild linear subsegmental atelectasis/scarring at the lung bases, stable. Hepatobiliary: Normal liver. There is mild intrahepatic bile duct dilation new since the prior study. Gallbladder is distended. Small amount of dependent material of relative increased attenuation suggest sludge. No discrete stone. No wall thickening or adjacent inflammation. Common bile duct measures a maximum of 1.4 cm in diameter. It tapers distally. No evidence of a duct stone common bile duct has increased in caliber since prior study. Pancreas: Irregular appearance of the anterior aspect of the pancreatic head and neck which is similar to the prior CT. No pancreatic mass or convincing inflammation. No duct dilation. Spleen: Spleen normal in size and attenuation. No mass. Stable surgical clip lies along the anterior margin of the spleen. Adrenals/Urinary Tract: Normal adrenal glands. Kidneys normal in position. Mild bilateral cortical thinning. Numerous bilateral intrarenal stones. There are low-attenuation renal masses, largest protruding posteriorly from the midpole of the right kidney measuring 4.9 cm.  Largest on the left lies at the midpole measuring 9 mm. These are all consistent with cysts. They are stable from prior exam. No follow-up recommended. There is no hydronephrosis. Ureters are normal in course and in caliber. Bladder is mildly distended, unremarkable. Stomach/Bowel: Several surgical vascular clips lie adjacent to the stomach, stable. Stomach otherwise unremarkable. Small bowel and colon are normal in caliber. No wall thickening. No inflammation. Multiple colonic diverticula, mostly along the sigmoid, without evidence of diverticulitis. Vascular/Lymphatic: Aortic atherosclerosis. No aneurysm. No enlarged lymph nodes. Reproductive: Normal sized prostate. Other: No abdominal wall hernia.  No ascites. Musculoskeletal: No fracture or acute finding. No osteoblastic or osteolytic lesions. Stable dextroscoliosis of the lumbar spine. Previous posterior lumbar spine fusion, L2 through S1, also stable. IMPRESSION: 1. Intra and extrahepatic bile duct dilation that has increased when compared to the prior CT, but without evidence of a duct stone or obstructing mass. Consider follow-up MRCP or ERCP for further assessment. 2. Probable dependent gallbladder sludge, but no evidence of a stone. No evidence of acute cholecystitis. 3. No other evidence an acute abnormality within the abdomen or pelvis. 4. Multiple chronic findings are noted including renal cysts and multiple bilateral nonobstructing intrarenal stones, aortic atherosclerosis and colonic diverticulosis. Electronically Signed   By: Amie Portland M.D.   On: 07/19/2023 11:56    Assessment & Plan:   Problem List Items Addressed This Visit     Lumbar disc disease    Chronic LBP - 75% better on a new dose of Oxycodone Narcan to have at home Continue with weight loss Potential benefits of a long term opioids use as well as potential risks (i.e. addiction risk, apnea etc) and complications (i.e. Somnolence, constipation and others) were explained to  the patient and were aknowledged.       Paroxysmal atrial fibrillation (HCC)    Cont on anticoagulation - Eliquis      Obesity    On diet Wt Readings from Last 3 Encounters:  07/31/23 207 lb (93.9 kg)  07/03/23 208 lb (94.3 kg)  06/03/23 209 lb (94.8 kg)  Essential hypertension    Cont on Telmisartan, Lasix      Low back pain - Primary    Chronic LBP - 75% better on a new dose of Oxycodone      Relevant Medications   oxycodone (ROXICODONE) 30 MG immediate release tablet   oxycodone (ROXICODONE) 30 MG immediate release tablet      Meds ordered this encounter  Medications   oxycodone (ROXICODONE) 30 MG immediate release tablet    Sig: Take 1 tablet (30 mg total) by mouth every 8 (eight) hours as needed for pain.    Dispense:  90 tablet    Refill:  0    Please fill on or after 08/02/23   oxycodone (ROXICODONE) 30 MG immediate release tablet    Sig: Take 1 tablet (30 mg total) by mouth every 8 (eight) hours as needed for pain.    Dispense:  90 tablet    Refill:  0    Please fill on or after 08/30/2023      Follow-up: No follow-ups on file.  Sonda Primes, MD

## 2023-07-31 NOTE — Assessment & Plan Note (Signed)
On diet Wt Readings from Last 3 Encounters:  07/31/23 207 lb (93.9 kg)  07/03/23 208 lb (94.3 kg)  06/03/23 209 lb (94.8 kg)

## 2023-07-31 NOTE — Assessment & Plan Note (Signed)
Chronic LBP - 75% better on a new dose of Oxycodone

## 2023-07-31 NOTE — Assessment & Plan Note (Signed)
Cont on anticoagulation - Eliquis ?

## 2023-07-31 NOTE — Assessment & Plan Note (Signed)
Cont on Telmisartan, Lasix

## 2023-07-31 NOTE — Assessment & Plan Note (Signed)
Chronic LBP - 75% better on a new dose of Oxycodone Narcan to have at home Continue with weight loss Potential benefits of a long term opioids use as well as potential risks (i.e. addiction risk, apnea etc) and complications (i.e. Somnolence, constipation and others) were explained to the patient and were aknowledged.

## 2023-08-30 DIAGNOSIS — R1084 Generalized abdominal pain: Secondary | ICD-10-CM | POA: Diagnosis not present

## 2023-08-30 DIAGNOSIS — Z87442 Personal history of urinary calculi: Secondary | ICD-10-CM | POA: Diagnosis not present

## 2023-09-03 ENCOUNTER — Encounter: Payer: Self-pay | Admitting: Gastroenterology

## 2023-09-03 ENCOUNTER — Ambulatory Visit (INDEPENDENT_AMBULATORY_CARE_PROVIDER_SITE_OTHER): Payer: Medicare PPO | Admitting: Gastroenterology

## 2023-09-03 ENCOUNTER — Other Ambulatory Visit (INDEPENDENT_AMBULATORY_CARE_PROVIDER_SITE_OTHER): Payer: Medicare PPO

## 2023-09-03 VITALS — BP 126/76 | HR 84 | Ht 65.0 in | Wt 206.5 lb

## 2023-09-03 DIAGNOSIS — K2270 Barrett's esophagus without dysplasia: Secondary | ICD-10-CM | POA: Diagnosis not present

## 2023-09-03 DIAGNOSIS — K838 Other specified diseases of biliary tract: Secondary | ICD-10-CM

## 2023-09-03 DIAGNOSIS — Z800 Family history of malignant neoplasm of digestive organs: Secondary | ICD-10-CM

## 2023-09-03 DIAGNOSIS — R932 Abnormal findings on diagnostic imaging of liver and biliary tract: Secondary | ICD-10-CM

## 2023-09-03 DIAGNOSIS — K219 Gastro-esophageal reflux disease without esophagitis: Secondary | ICD-10-CM | POA: Diagnosis not present

## 2023-09-03 LAB — COMPREHENSIVE METABOLIC PANEL
ALT: 9 U/L (ref 0–53)
AST: 12 U/L (ref 0–37)
Albumin: 4.1 g/dL (ref 3.5–5.2)
Alkaline Phosphatase: 73 U/L (ref 39–117)
BUN: 15 mg/dL (ref 6–23)
CO2: 27 meq/L (ref 19–32)
Calcium: 10.1 mg/dL (ref 8.4–10.5)
Chloride: 106 meq/L (ref 96–112)
Creatinine, Ser: 1.09 mg/dL (ref 0.40–1.50)
GFR: 65.25 mL/min (ref >60.00)
Glucose, Bld: 96 mg/dL (ref 70–99)
Potassium: 4.8 meq/L (ref 3.5–5.1)
Sodium: 141 meq/L (ref 135–145)
Total Bilirubin: 0.5 mg/dL (ref 0.2–1.2)
Total Protein: 6.9 g/dL (ref 6.0–8.3)

## 2023-09-03 NOTE — Patient Instructions (Signed)
Your provider has requested that you go to the basement level for lab work before leaving today. Press "B" on the elevator. The lab is located at the first door on the left as you exit the elevator.  You have been scheduled for an MRI/MRCP at [facility] (Entrance A) on 09/06/23. Your appointment time is 4:00pm. Please arrive to Entrance A 30 minutes prior to your appointment time for registration purposes. Please make certain not to have anything to eat or drink 6 hours prior to your test. In addition, if you have any metal in your body, have a pacemaker or defibrillator, please be sure to let your ordering physician know. This test typically takes 45 minutes to 1 hour to complete. Should you need to reschedule, please call [phone] to do so.   The [facility] providers would like to encourage you to use [facility] to communicate with providers for non-urgent requests or questions.  Due to long hold times on the telephone, sending your provider a message by [facility] may be a faster and more efficient way to get a response.  Please allow 48 business hours for a response.  Please remember that this is for non-urgent requests.   Due to recent changes in healthcare laws, you may see the results of your imaging and laboratory studies on MyChart before your provider has had a chance to review them.  We understand that in some cases there may be results that are confusing or concerning to you. Not all laboratory results come back in the same time frame and the provider may be waiting for multiple results in order to interpret others.  Please give [place] 48 hours in order for your provider to thoroughly review all the results before contacting the office for clarification of your results.   Thank you for choosing me and [facility].  Venita Lick. Pleas Koch., MD., Clementeen Graham

## 2023-09-03 NOTE — Progress Notes (Signed)
Assessment     Dilated biliary tree (CBD 1.4 cm), gallbladder sludge, abnormal pancreatic head on CT AP - R/O obstruction: CBD stone, stricture, mass GERD, Barrett's esophagus, long segment without dysplasia Family history of colon cancer PAF on Eliquis OSA PAH   Recommendations    Schedule MRI/MRCP CMP today Continue pantoprazole 40 mg po bid. Defer decision on Barrett's surveillance until biliary evaluation completed Consider colonoscopy in Oct 2026 vs deferring d/t age, comorbidities    HPI    This is a 77 year old male with a dilated biliary tree on CT AP. He is accompanied by his wife.  He relates ongoing severe back pain.  He noted worsening right flank and right upper quadrant pain that has now improved.  In retrospect he feels this pain was probably related to back pain.  CTAP performed as below showed biliary dilation.  LFTs in August were normal.  His reflux symptoms are well-controlled.  He has no significant gastrointestinal complaints. Denies weight loss, constipation, diarrhea, change in stool caliber, melena, hematochezia, nausea, vomiting, dysphagia, chest pain.   Labs / Imaging       Latest Ref Rng & Units 07/03/2023    4:13 PM 01/10/2023   12:54 PM 01/02/2023    4:19 PM  Hepatic Function  Total Protein 6.0 - 8.3 g/dL 7.2  6.8  7.4   Albumin 3.5 - 5.2 g/dL 4.3  3.9  4.4   AST 0 - 37 U/L 14  14  14    ALT 0 - 53 U/L 14  12  15    Alk Phosphatase 39 - 117 U/L 84  63  67   Total Bilirubin 0.2 - 1.2 mg/dL 0.6  0.5  0.5        Latest Ref Rng & Units 07/03/2023    4:13 PM 02/04/2023    2:26 PM 05/13/2022    3:13 AM  CBC  WBC 4.0 - 10.5 K/uL 6.9  6.0  9.1   Hemoglobin 13.0 - 17.0 g/dL 34.7  42.5  95.6   Hematocrit 39.0 - 52.0 % 44.0  39.4  40.4   Platelets 150.0 - 400.0 K/uL 321.0  343  291      CT ABDOMEN PELVIS W CONTRAST CLINICAL DATA:  Nausea.  Right upper quadrant pain.  EXAM: CT ABDOMEN AND PELVIS WITH CONTRAST  TECHNIQUE: Multidetector CT  imaging of the abdomen and pelvis was performed using the standard protocol following bolus administration of intravenous contrast.  RADIATION DOSE REDUCTION: This exam was performed according to the departmental dose-optimization program which includes automated exposure control, adjustment of the mA and/or kV according to patient size and/or use of iterative reconstruction technique.  CONTRAST:  ISOVUE-300 IOPAMIDOL (ISOVUE-300) INJECTION 61%  COMPARISON:  05/13/2022  FINDINGS: Lower chest: No acute findings. Mild linear subsegmental atelectasis/scarring at the lung bases, stable.  Hepatobiliary: Normal liver. There is mild intrahepatic bile duct dilation new since the prior study. Gallbladder is distended. Small amount of dependent material of relative increased attenuation suggest sludge. No discrete stone. No wall thickening or adjacent inflammation. Common bile duct measures a maximum of 1.4 cm in diameter. It tapers distally. No evidence of a duct stone common bile duct has increased in caliber since prior study.  Pancreas: Irregular appearance of the anterior aspect of the pancreatic head and neck which is similar to the prior CT. No pancreatic mass or convincing inflammation. No duct dilation.  Spleen: Spleen normal in size and attenuation. No mass. Stable surgical  clip lies along the anterior margin of the spleen.  Adrenals/Urinary Tract: Normal adrenal glands.  Kidneys normal in position. Mild bilateral cortical thinning. Numerous bilateral intrarenal stones. There are low-attenuation renal masses, largest protruding posteriorly from the midpole of the right kidney measuring 4.9 cm. Largest on the left lies at the midpole measuring 9 mm. These are all consistent with cysts. They are stable from prior exam. No follow-up recommended. There is no hydronephrosis. Ureters are normal in course and in caliber. Bladder is mildly distended,  unremarkable.  Stomach/Bowel: Several surgical vascular clips lie adjacent to the stomach, stable. Stomach otherwise unremarkable. Small bowel and colon are normal in caliber. No wall thickening. No inflammation. Multiple colonic diverticula, mostly along the sigmoid, without evidence of diverticulitis.  Vascular/Lymphatic: Aortic atherosclerosis. No aneurysm. No enlarged lymph nodes.  Reproductive: Normal sized prostate.  Other: No abdominal wall hernia.  No ascites.  Musculoskeletal: No fracture or acute finding. No osteoblastic or osteolytic lesions. Stable dextroscoliosis of the lumbar spine. Previous posterior lumbar spine fusion, L2 through S1, also stable.  IMPRESSION: 1. Intra and extrahepatic bile duct dilation that has increased when compared to the prior CT, but without evidence of a duct stone or obstructing mass. Consider follow-up MRCP or ERCP for further assessment. 2. Probable dependent gallbladder sludge, but no evidence of a stone. No evidence of acute cholecystitis. 3. No other evidence an acute abnormality within the abdomen or pelvis. 4. Multiple chronic findings are noted including renal cysts and multiple bilateral nonobstructing intrarenal stones, aortic atherosclerosis and colonic diverticulosis.  Electronically Signed   By: Amie Portland M.D.   On: 07/19/2023 11:56   Current Medications, Allergies, Past Medical History, Past Surgical History, Family History and Social History were reviewed in [employer] record.   Physical Exam: General: Well developed, well nourished, no acute distress Head: Normocephalic and atraumatic Eyes: Sclerae anicteric, EOMI Ears: Normal auditory acuity Mouth: No deformities or lesions noted Lungs: Clear throughout to auscultation Heart: Regular rate and rhythm; No murmurs, rubs or bruits Abdomen: Soft, non tender and non distended. No masses, hepatosplenomegaly or hernias noted. Normal Bowel  sounds Rectal:  Not done Musculoskeletal: Symmetrical with no gross deformities  Pulses:  Normal pulses noted Extremities: No edema or deformities noted Neurological: Alert oriented x 4, grossly nonfocal Psychological:  Alert and cooperative. Normal mood and affect   Moriah Shawley T. Russella Dar, MD 09/03/2023, 2:18 PM

## 2023-09-04 ENCOUNTER — Ambulatory Visit: Payer: Medicare PPO | Admitting: Internal Medicine

## 2023-09-06 ENCOUNTER — Ambulatory Visit (HOSPITAL_COMMUNITY): Payer: Medicare PPO

## 2023-09-11 ENCOUNTER — Other Ambulatory Visit (HOSPITAL_COMMUNITY): Payer: Self-pay | Admitting: Gastroenterology

## 2023-09-11 ENCOUNTER — Ambulatory Visit (HOSPITAL_COMMUNITY)
Admission: RE | Admit: 2023-09-11 | Discharge: 2023-09-11 | Disposition: A | Discharge status: Home / Self Care | Payer: Medicare PPO | Source: Ambulatory Visit | Attending: Gastroenterology | Admitting: Gastroenterology

## 2023-09-11 DIAGNOSIS — R52 Pain, unspecified: Secondary | ICD-10-CM

## 2023-09-11 DIAGNOSIS — R932 Abnormal findings on diagnostic imaging of liver and biliary tract: Secondary | ICD-10-CM | POA: Diagnosis not present

## 2023-09-11 DIAGNOSIS — K838 Other specified diseases of biliary tract: Secondary | ICD-10-CM | POA: Diagnosis not present

## 2023-09-11 DIAGNOSIS — R1900 Intra-abdominal and pelvic swelling, mass and lump, unspecified site: Secondary | ICD-10-CM | POA: Diagnosis not present

## 2023-09-11 DIAGNOSIS — R109 Unspecified abdominal pain: Secondary | ICD-10-CM | POA: Diagnosis not present

## 2023-09-11 DIAGNOSIS — K828 Other specified diseases of gallbladder: Secondary | ICD-10-CM | POA: Diagnosis not present

## 2023-09-11 DIAGNOSIS — K8689 Other specified diseases of pancreas: Secondary | ICD-10-CM | POA: Diagnosis not present

## 2023-09-11 MED ORDER — GADOBUTROL 1 MMOL/ML IV SOLN
9.0000 mL | Freq: Once | INTRAVENOUS | Status: AC | PRN
  Administered 2023-09-11: 9 mL via INTRAVENOUS

## 2023-09-20 ENCOUNTER — Encounter: Payer: Self-pay | Admitting: Gastroenterology

## 2023-09-24 ENCOUNTER — Other Ambulatory Visit: Payer: Self-pay

## 2023-09-24 DIAGNOSIS — K838 Other specified diseases of biliary tract: Secondary | ICD-10-CM

## 2023-09-25 DIAGNOSIS — G4733 Obstructive sleep apnea (adult) (pediatric): Secondary | ICD-10-CM | POA: Diagnosis not present

## 2023-09-28 ENCOUNTER — Other Ambulatory Visit: Payer: Self-pay | Admitting: Internal Medicine

## 2023-09-30 ENCOUNTER — Ambulatory Visit (INDEPENDENT_AMBULATORY_CARE_PROVIDER_SITE_OTHER): Payer: Medicare PPO | Admitting: Internal Medicine

## 2023-09-30 ENCOUNTER — Telehealth: Payer: Self-pay | Admitting: Cardiovascular Disease

## 2023-09-30 ENCOUNTER — Encounter: Payer: Self-pay | Admitting: Internal Medicine

## 2023-09-30 VITALS — BP 118/78 | HR 79 | Temp 98.6°F | Ht 65.0 in | Wt 201.0 lb

## 2023-09-30 DIAGNOSIS — I5032 Chronic diastolic (congestive) heart failure: Secondary | ICD-10-CM

## 2023-09-30 DIAGNOSIS — M5442 Lumbago with sciatica, left side: Secondary | ICD-10-CM

## 2023-09-30 DIAGNOSIS — G8929 Other chronic pain: Secondary | ICD-10-CM | POA: Diagnosis not present

## 2023-09-30 DIAGNOSIS — N182 Chronic kidney disease, stage 2 (mild): Secondary | ICD-10-CM

## 2023-09-30 DIAGNOSIS — Z7985 Long-term (current) use of injectable non-insulin antidiabetic drugs: Secondary | ICD-10-CM

## 2023-09-30 DIAGNOSIS — M5441 Lumbago with sciatica, right side: Secondary | ICD-10-CM

## 2023-09-30 DIAGNOSIS — D500 Iron deficiency anemia secondary to blood loss (chronic): Secondary | ICD-10-CM

## 2023-09-30 DIAGNOSIS — E1169 Type 2 diabetes mellitus with other specified complication: Secondary | ICD-10-CM

## 2023-09-30 DIAGNOSIS — I119 Hypertensive heart disease without heart failure: Secondary | ICD-10-CM

## 2023-09-30 DIAGNOSIS — E669 Obesity, unspecified: Secondary | ICD-10-CM

## 2023-09-30 MED ORDER — OXYCODONE HCL 30 MG PO TABS: 30.0000 mg | ORAL_TABLET | Freq: Three times a day (TID) | ORAL | 0 refills | Status: DC | PRN

## 2023-09-30 MED ORDER — ONDANSETRON HCL 4 MG PO TABS: 4.0000 mg | ORAL_TABLET | Freq: Three times a day (TID) | ORAL | 1 refills | Status: DC | PRN

## 2023-09-30 NOTE — Assessment & Plan Note (Signed)
Continue with Ozempic. No change in dose - 1 mg/wk

## 2023-09-30 NOTE — Assessment & Plan Note (Signed)
Hydrate well ?Monitor GFR ?

## 2023-09-30 NOTE — Progress Notes (Unsigned)
Subjective:  Patient ID: Cody Frazier, male    DOB: 07/26/1946  Age: 77 y.o. MRN: [id number]  CC: Medical Management of Chronic Issues (3 mnth f/u)   HPI Cody Frazier presents for DM, LBP, HTN Complaining of occasional nausea Low back pain is down to 5 out of 10 on medication  Outpatient Medications Prior to Visit  Medication Sig Dispense Refill   acetaminophen (TYLENOL) 500 MG tablet Take 1,000 mg by mouth every 6 (six) hours as needed for mild pain or moderate pain. Maximum daily dose of 3g     atorvastatin (LIPITOR) 20 MG tablet TAKE 1 TABLET BY MOUTH EVERY DAY 90 tablet 3   cetirizine (ZYRTEC) 10 MG tablet Take 10 mg by mouth daily.     Cholecalciferol (VITAMIN D3) 1.25 MG (50000 UT) CAPS TAKE 1 CAPSULE BY MOUTH MONTHLY 3 capsule 3   ELIQUIS 2.5 MG TABS tablet TAKE 1 TABLET BY MOUTH TWICE A DAY 180 tablet 3   famotidine (PEPCID) 40 MG tablet Take 1 tablet (40 mg total) by mouth at bedtime. 30 tablet 11   fluticasone (FLONASE) 50 MCG/ACT nasal spray SPRAY 2 SPRAYS INTO EACH NOSTRIL EVERY DAY 48 mL 3   furosemide (LASIX) 40 MG tablet Take 1 tablet (40 mg total) by mouth every other day. Take 40mg  every other day. May take an extra dose if your weight is over 212 pounds. 60 tablet 3   hyoscyamine (LEVSIN SL) 0.125 MG SL tablet Place 1-2 tablets (0.125-0.25 mg total) under the tongue every 4 (four) hours as needed for cramping. 120 tablet 1   latanoprost (XALATAN) 0.005 % ophthalmic solution Place 1 drop into both eyes at bedtime.  11   metoprolol succinate (TOPROL-XL) 25 MG 24 hr tablet TAKE 1 TABLET BY MOUTH EVERY DAY 90 tablet 3   pantoprazole (PROTONIX) 40 MG tablet Take 1 tablet (40 mg total) by mouth 2 (two) times daily. 60 tablet 11   Semaglutide, 1 MG/DOSE, 4 MG/3ML SOPN Inject 1 mg into the skin once a week. 3 mL 3   spironolactone (ALDACTONE) 25 MG tablet TAKE 1 TABLET BY MOUTH EVERY DAY 90 tablet 3   telmisartan (MICARDIS) 20 MG tablet Take 0.5 tablets (10 mg total) by  mouth daily. 45 tablet 3   timolol (TIMOPTIC) 0.25 % ophthalmic solution Place 1 drop into both eyes 2 (two) times daily.      triamcinolone cream (KENALOG) 0.1 % Apply 1 application topically 3 (three) times daily. 80 g 3   oxycodone (ROXICODONE) 30 MG immediate release tablet Take 1 tablet (30 mg total) by mouth every 8 (eight) hours as needed for pain. 90 tablet 0   oxycodone (ROXICODONE) 30 MG immediate release tablet Take 1 tablet (30 mg total) by mouth every 8 (eight) hours as needed for pain. 90 tablet 0   naloxone (NARCAN) 0.4 MG/ML injection Use prn if severe oversedation with oxycodone (Patient not taking: Reported on 09/03/2023)     No facility-administered medications prior to visit.    ROS: Review of Systems  Constitutional:  Negative for appetite change, fatigue and unexpected weight change.  HENT:  Negative for congestion, nosebleeds, sneezing, sore throat and trouble swallowing.   Eyes:  Negative for itching and visual disturbance.  Respiratory:  Negative for cough.   Cardiovascular:  Negative for chest pain, palpitations and leg swelling.  Gastrointestinal:  Positive for nausea. Negative for abdominal distention, blood in stool and diarrhea.  Genitourinary:  Negative for frequency and hematuria.  Musculoskeletal:  Positive for back pain and gait problem. Negative for joint swelling and neck pain.  Skin:  Positive for wound. Negative for rash.  Neurological:  Negative for dizziness, tremors, speech difficulty and weakness.  Psychiatric/Behavioral:  Negative for agitation, dysphoric mood and sleep disturbance. The patient is not nervous/anxious.     Objective:  BP 118/78 (BP Location: Right Arm, Patient Position: Sitting, Cuff Size: Normal)   Pulse 79   Temp 98.6 F (37 C) (Oral)   Ht 5\' 5"  (1.651 m)   Wt 201 lb (91.2 kg)   SpO2 95%   BMI 33.45 kg/m   BP Readings from Last 3 Encounters:  09/30/23 118/78  09/03/23 126/76  07/31/23 110/62    Wt Readings from  Last 3 Encounters:  09/30/23 201 lb (91.2 kg)  09/03/23 206 lb 8 oz (93.7 kg)  07/31/23 207 lb (93.9 kg)    Physical Exam Constitutional:      General: He is not in acute distress.    Appearance: He is well-developed. He is obese.     Comments: NAD  Eyes:     Conjunctiva/sclera: Conjunctivae normal.     Pupils: Pupils are equal, round, and reactive to light.  Neck:     Thyroid: No thyromegaly.     Vascular: No JVD.  Cardiovascular:     Rate and Rhythm: Normal rate and regular rhythm.     Heart sounds: Normal heart sounds. No murmur heard.    No friction rub. No gallop.  Pulmonary:     Effort: Pulmonary effort is normal. No respiratory distress.     Breath sounds: Normal breath sounds. No wheezing or rales.  Chest:     Chest wall: No tenderness.  Abdominal:     General: Bowel sounds are normal. There is no distension.     Palpations: Abdomen is soft. There is no mass.     Tenderness: There is no abdominal tenderness. There is no guarding or rebound.  Musculoskeletal:        General: No tenderness. Normal range of motion.     Cervical back: Normal range of motion.     Right lower leg: No edema.     Left lower leg: No edema.  Lymphadenopathy:     Cervical: No cervical adenopathy.  Skin:    General: Skin is warm and dry.     Findings: No rash.  Neurological:     Mental Status: He is alert and oriented to person, place, and time.     Cranial Nerves: No cranial nerve deficit.     Motor: No abnormal muscle tone.     Coordination: Coordination normal.     Gait: Gait normal.     Deep Tendon Reflexes: Reflexes are normal and symmetric.  Psychiatric:        Behavior: Behavior normal.        Thought Content: Thought content normal.        Judgment: Judgment normal.   LS spine with stiffness, pain with range of motion  Lab Results  Component Value Date   WBC 6.9 07/03/2023   HGB 14.3 07/03/2023   HCT 44.0 07/03/2023   PLT 321.0 07/03/2023   GLUCOSE 96 09/03/2023   CHOL  196 11/20/2018   TRIG 269.0 (H) 11/20/2018   HDL 43.60 11/20/2018   LDLDIRECT 109.0 11/20/2018   LDLCALC 86 11/25/2017   ALT 9 09/03/2023   AST 12 09/03/2023   NA 141 09/03/2023   K 4.8 09/03/2023   CL  106 09/03/2023   CREATININE 1.09 09/03/2023   BUN 15 09/03/2023   CO2 27 09/03/2023   TSH 0.83 07/03/2023   PSA 0.16 11/20/2018   INR 1.1 08/12/2020   HGBA1C 5.8 01/02/2023    MR ABDOMEN MRCP W WO CONTAST  Result Date: 09/13/2023 CLINICAL DATA:  Pain, dilated common bile duct, rule out stone or mass EXAM: MRI ABDOMEN WITHOUT AND WITH CONTRAST (INCLUDING MRCP) TECHNIQUE: Multiplanar multisequence MR imaging of the abdomen was performed both before and after the administration of intravenous contrast. Heavily T2-weighted images of the biliary and pancreatic ducts were obtained, and three-dimensional MRCP images were rendered by post processing. CONTRAST:  9mL GADAVIST GADOBUTROL 1 MMOL/ML IV SOLN COMPARISON:  CT abdomen pelvis, 07/09/2023 FINDINGS: Lower chest: No acute abnormality. Hepatobiliary: No solid liver abnormality is seen. Mildly distended gallbladder with layering sludge (series 4, image 18). Intra and extrahepatic biliary ductal dilatation, the common bile duct measuring up to 1.3 cm in caliber in the porta hepatis. The duct is dilated and tapers slightly at the ampulla without evidence of mass or other obstruction (series 4, image 14). Pancreas: Diffusely atrophic pancreas. No mass or suspicious contrast enhancement. The central pancreatic duct is mildly prominent measuring up 2 0.4 cm in caliber near the ampulla (series 4, image 14). Occasional tiny subcentimeter fluid signal cystic lesions scattered throughout the pancreas, measuring no greater than 0.5 cm, consistent with small pseudocysts or IPMNs and requiring no specific further follow-up or characterization. Spleen: Normal in size without significant abnormality. Adrenals/Urinary Tract: Adrenal glands are unremarkable. Simple and  thinly septated benign fluid signal renal cortical cysts, for which no further follow-up or characterization is required. Kidneys are otherwise normal, without obvious renal calculi, solid lesion, or hydronephrosis. Stomach/Bowel: Stomach is within normal limits. No evidence of bowel wall thickening, distention, or inflammatory changes. Vascular/Lymphatic: No significant vascular findings are present. No enlarged abdominal lymph nodes. Other: No abdominal wall hernia or abnormality. No ascites. Musculoskeletal: No acute or significant osseous findings. IMPRESSION: 1. Intra and extrahepatic biliary ductal dilatation, the common bile duct measuring up to 1.3 cm in caliber in the porta hepatis. The duct is dilated and tapers slightly at the ampulla without evidence of mass, calculus or other obstruction. This is of uncertain significance, and the ampulla is unusually well-visualized on today's examination. Consider ERCP to further assess functional obstruction of the sphincter of Oddi. 2. Diffusely atrophic pancreas. No mass or suspicious contrast enhancement. The central pancreatic duct is mildly prominent measuring up to 0.4 cm in caliber near the ampulla. Suspect that this general appearance reflects chronic sequelae of pancreatitis. No acute inflammatory findings. 3. Mildly distended gallbladder with layering sludge. No discrete calculi or evidence of acute cholecystitis. Electronically Signed   By: Jearld Lesch M.D.   On: 09/13/2023 07:08   MR 3D Recon At Scanner  Result Date: 09/13/2023 CLINICAL DATA:  Pain, dilated common bile duct, rule out stone or mass EXAM: MRI ABDOMEN WITHOUT AND WITH CONTRAST (INCLUDING MRCP) TECHNIQUE: Multiplanar multisequence MR imaging of the abdomen was performed both before and after the administration of intravenous contrast. Heavily T2-weighted images of the biliary and pancreatic ducts were obtained, and three-dimensional MRCP images were rendered by post processing.  CONTRAST:  9mL GADAVIST GADOBUTROL 1 MMOL/ML IV SOLN COMPARISON:  CT abdomen pelvis, 07/09/2023 FINDINGS: Lower chest: No acute abnormality. Hepatobiliary: No solid liver abnormality is seen. Mildly distended gallbladder with layering sludge (series 4, image 18). Intra and extrahepatic biliary ductal dilatation, the common bile duct  measuring up to 1.3 cm in caliber in the porta hepatis. The duct is dilated and tapers slightly at the ampulla without evidence of mass or other obstruction (series 4, image 14). Pancreas: Diffusely atrophic pancreas. No mass or suspicious contrast enhancement. The central pancreatic duct is mildly prominent measuring up 2 0.4 cm in caliber near the ampulla (series 4, image 14). Occasional tiny subcentimeter fluid signal cystic lesions scattered throughout the pancreas, measuring no greater than 0.5 cm, consistent with small pseudocysts or IPMNs and requiring no specific further follow-up or characterization. Spleen: Normal in size without significant abnormality. Adrenals/Urinary Tract: Adrenal glands are unremarkable. Simple and thinly septated benign fluid signal renal cortical cysts, for which no further follow-up or characterization is required. Kidneys are otherwise normal, without obvious renal calculi, solid lesion, or hydronephrosis. Stomach/Bowel: Stomach is within normal limits. No evidence of bowel wall thickening, distention, or inflammatory changes. Vascular/Lymphatic: No significant vascular findings are present. No enlarged abdominal lymph nodes. Other: No abdominal wall hernia or abnormality. No ascites. Musculoskeletal: No acute or significant osseous findings. IMPRESSION: 1. Intra and extrahepatic biliary ductal dilatation, the common bile duct measuring up to 1.3 cm in caliber in the porta hepatis. The duct is dilated and tapers slightly at the ampulla without evidence of mass, calculus or other obstruction. This is of uncertain significance, and the ampulla is  unusually well-visualized on today's examination. Consider ERCP to further assess functional obstruction of the sphincter of Oddi. 2. Diffusely atrophic pancreas. No mass or suspicious contrast enhancement. The central pancreatic duct is mildly prominent measuring up to 0.4 cm in caliber near the ampulla. Suspect that this general appearance reflects chronic sequelae of pancreatitis. No acute inflammatory findings. 3. Mildly distended gallbladder with layering sludge. No discrete calculi or evidence of acute cholecystitis. Electronically Signed   By: Jearld Lesch M.D.   On: 09/13/2023 07:08    Assessment & Plan:   Problem List Items Addressed This Visit     Iron deficiency anemia - Primary    Monitor CBC      Hypertensive heart disease without CHF    Continue with losartan      CRI (chronic renal insufficiency), stage 2 (mild)    Hydrate well Monitor GFR      Chronic diastolic CHF (congestive heart failure) (HCC)    Loose wt NAS diet      Type 2 diabetes mellitus with obesity (HCC)    Continue with Ozempic. No change in dose - 1 mg/wk      Low back pain    Chronic LBP - 75% better on a new dose of Oxycodone      Relevant Medications   oxycodone (ROXICODONE) 30 MG immediate release tablet   oxycodone (ROXICODONE) 30 MG immediate release tablet      Meds ordered this encounter  Medications   oxycodone (ROXICODONE) 30 MG immediate release tablet    Sig: Take 1 tablet (30 mg total) by mouth every 8 (eight) hours as needed for pain.    Dispense:  90 tablet    Refill:  0    Please fill on or after 09/30/23   oxycodone (ROXICODONE) 30 MG immediate release tablet    Sig: Take 1 tablet (30 mg total) by mouth every 8 (eight) hours as needed for pain.    Dispense:  90 tablet    Refill:  0    Please fill on or after 10/30/2023   ondansetron (ZOFRAN) 4 MG tablet    Sig: Take 1  tablet (4 mg total) by mouth every 8 (eight) hours as needed for nausea or vomiting.    Dispense:   21 tablet    Refill:  1      Follow-up: No follow-ups on file.  Sonda Primes, MD

## 2023-09-30 NOTE — Assessment & Plan Note (Signed)
Continue with losartan 

## 2023-09-30 NOTE — Assessment & Plan Note (Signed)
Chronic LBP - 75% better on a new dose of Oxycodone

## 2023-09-30 NOTE — Assessment & Plan Note (Signed)
Loose wt NAS diet

## 2023-09-30 NOTE — Assessment & Plan Note (Signed)
Monitor CBC 

## 2023-09-30 NOTE — Telephone Encounter (Signed)
Pt c/o medication issue:  1. Name of Medication:   Zofran  2. How are you currently taking this medication (dosage and times per day)?   Not taking yet  3. Are you having a reaction (difficulty breathing--STAT)?   4. What is your medication issue?   Patient stated he has been prescribed this medication today for nausea.  Patient wants to confirm it is OK for him to take this medication.

## 2023-10-01 NOTE — Telephone Encounter (Signed)
Yes, ok to take zofran

## 2023-10-01 NOTE — Telephone Encounter (Signed)
Called and spoke to patient. Advised okays to take ZSofran

## 2023-10-20 ENCOUNTER — Other Ambulatory Visit: Payer: Self-pay | Admitting: Gastroenterology

## 2023-10-20 DIAGNOSIS — D508 Other iron deficiency anemias: Secondary | ICD-10-CM

## 2023-10-20 DIAGNOSIS — K2270 Barrett's esophagus without dysplasia: Secondary | ICD-10-CM

## 2023-10-24 ENCOUNTER — Other Ambulatory Visit: Payer: Self-pay | Admitting: Cardiovascular Disease

## 2023-10-30 ENCOUNTER — Other Ambulatory Visit: Payer: Self-pay | Admitting: Internal Medicine

## 2023-10-30 DIAGNOSIS — E669 Obesity, unspecified: Secondary | ICD-10-CM

## 2023-11-12 ENCOUNTER — Other Ambulatory Visit: Payer: Self-pay

## 2023-11-12 ENCOUNTER — Encounter (HOSPITAL_COMMUNITY): Payer: Self-pay | Admitting: Gastroenterology

## 2023-11-12 NOTE — Progress Notes (Signed)
 COVID Vaccine Completed:  Yes  Date of COVID positive in last 90 days:  No  PCP - Karlynn Noel, MD Cardiologist - Jerel Balding, MD  Chest x-ray -  EKG - 03-27-23 Epic Stress Test - 07-20-16 Epic ECHO - 05-13-22 Epic Cardiac Cath -  Pacemaker/ICD device last checked: Spinal Cord Stimulator: Long Term Monitor - 02-04-20 Epic  Bowel Prep - N/A  Sleep Study - Yes, +sleep apnea CPAP - Yes  Fasting Blood Sugar -  Checks Blood Sugar - does not check   Ozempic  Last dose of GLP1 agonist-  11/03/23 GLP1 instructions:  Hold 7 days before surgery    Last dose of SGLT-2 inhibitors-  N/A SGLT-2 instructions:  Hold 3 days before surgery    Blood Thinner Instructions:  Eliquis .  Per patient to hold 2 days prior to procedure. Aspirin Instructions: N/A Last Dose:  Activity level:  Can go up a flight of stairs and perform activities of daily living without stopping and without symptoms of chest pain or shortness of breath.  Some limitations due to back pain  Anesthesia review:  Afib, aortic stenosis, CHF, HTN, OSA, pulmonary HTN  Patient denies shortness of breath, fever, cough and chest pain at PAT appointment (completed over the phone)  Patient verbalized understanding of instructions that were given to them at the PAT appointment. Patient was also instructed that they will need to review over the PAT instructions again at home before surgery.

## 2023-11-14 ENCOUNTER — Telehealth: Payer: Self-pay

## 2023-11-14 NOTE — Telephone Encounter (Signed)
 The pt does confirm that he received the ok to hold Eliquis 2 days prior to the upcoming procedure. I have asked Dr Plotnikov's office to fax that  in writing.

## 2023-11-14 NOTE — Telephone Encounter (Signed)
-----   Message from Nurse Joy Haegele P sent at 09/24/2023  2:37 PM EST ----- Need anti coag hold

## 2023-11-14 NOTE — Telephone Encounter (Signed)
 Patient called to advise the office he would like to continue his care with Dr. Doy Hutching.

## 2023-11-21 ENCOUNTER — Other Ambulatory Visit: Payer: Self-pay

## 2023-11-21 ENCOUNTER — Encounter (HOSPITAL_COMMUNITY)
Admission: RE | Disposition: A | Discharge status: Home / Self Care | Payer: Self-pay | Source: Home / Self Care | Attending: Gastroenterology

## 2023-11-21 ENCOUNTER — Encounter (HOSPITAL_COMMUNITY): Payer: Self-pay | Admitting: Gastroenterology

## 2023-11-21 ENCOUNTER — Ambulatory Visit (HOSPITAL_BASED_OUTPATIENT_CLINIC_OR_DEPARTMENT_OTHER): Payer: Medicare PPO | Admitting: Anesthesiology

## 2023-11-21 ENCOUNTER — Ambulatory Visit (HOSPITAL_COMMUNITY): Payer: Medicare PPO | Admitting: Anesthesiology

## 2023-11-21 ENCOUNTER — Telehealth: Payer: Self-pay

## 2023-11-21 ENCOUNTER — Ambulatory Visit (HOSPITAL_COMMUNITY)
Admission: RE | Admit: 2023-11-21 | Discharge: 2023-11-21 | Disposition: A | Discharge status: Home / Self Care | Payer: Medicare PPO | Attending: Gastroenterology | Admitting: Gastroenterology

## 2023-11-21 DIAGNOSIS — K219 Gastro-esophageal reflux disease without esophagitis: Secondary | ICD-10-CM | POA: Diagnosis not present

## 2023-11-21 DIAGNOSIS — Z9889 Other specified postprocedural states: Secondary | ICD-10-CM | POA: Diagnosis not present

## 2023-11-21 DIAGNOSIS — Z8249 Family history of ischemic heart disease and other diseases of the circulatory system: Secondary | ICD-10-CM | POA: Diagnosis not present

## 2023-11-21 DIAGNOSIS — K8689 Other specified diseases of pancreas: Secondary | ICD-10-CM | POA: Diagnosis not present

## 2023-11-21 DIAGNOSIS — K449 Diaphragmatic hernia without obstruction or gangrene: Secondary | ICD-10-CM | POA: Insufficient documentation

## 2023-11-21 DIAGNOSIS — G4730 Sleep apnea, unspecified: Secondary | ICD-10-CM | POA: Insufficient documentation

## 2023-11-21 DIAGNOSIS — Z83719 Family history of colon polyps, unspecified: Secondary | ICD-10-CM | POA: Insufficient documentation

## 2023-11-21 DIAGNOSIS — K922 Gastrointestinal hemorrhage, unspecified: Secondary | ICD-10-CM | POA: Diagnosis not present

## 2023-11-21 DIAGNOSIS — Z980 Intestinal bypass and anastomosis status: Secondary | ICD-10-CM | POA: Insufficient documentation

## 2023-11-21 DIAGNOSIS — K319 Disease of stomach and duodenum, unspecified: Secondary | ICD-10-CM | POA: Insufficient documentation

## 2023-11-21 DIAGNOSIS — E119 Type 2 diabetes mellitus without complications: Secondary | ICD-10-CM | POA: Insufficient documentation

## 2023-11-21 DIAGNOSIS — K3189 Other diseases of stomach and duodenum: Secondary | ICD-10-CM | POA: Insufficient documentation

## 2023-11-21 DIAGNOSIS — K2270 Barrett's esophagus without dysplasia: Secondary | ICD-10-CM

## 2023-11-21 DIAGNOSIS — Z800 Family history of malignant neoplasm of digestive organs: Secondary | ICD-10-CM | POA: Insufficient documentation

## 2023-11-21 DIAGNOSIS — Z87891 Personal history of nicotine dependence: Secondary | ICD-10-CM | POA: Diagnosis not present

## 2023-11-21 DIAGNOSIS — I4891 Unspecified atrial fibrillation: Secondary | ICD-10-CM

## 2023-11-21 DIAGNOSIS — R933 Abnormal findings on diagnostic imaging of other parts of digestive tract: Secondary | ICD-10-CM | POA: Insufficient documentation

## 2023-11-21 DIAGNOSIS — K2971 Gastritis, unspecified, with bleeding: Secondary | ICD-10-CM

## 2023-11-21 DIAGNOSIS — K838 Other specified diseases of biliary tract: Secondary | ICD-10-CM

## 2023-11-21 DIAGNOSIS — R1011 Right upper quadrant pain: Secondary | ICD-10-CM | POA: Insufficient documentation

## 2023-11-21 DIAGNOSIS — N182 Chronic kidney disease, stage 2 (mild): Secondary | ICD-10-CM | POA: Diagnosis not present

## 2023-11-21 DIAGNOSIS — I10 Essential (primary) hypertension: Secondary | ICD-10-CM | POA: Insufficient documentation

## 2023-11-21 DIAGNOSIS — K869 Disease of pancreas, unspecified: Secondary | ICD-10-CM

## 2023-11-21 DIAGNOSIS — I130 Hypertensive heart and chronic kidney disease with heart failure and stage 1 through stage 4 chronic kidney disease, or unspecified chronic kidney disease: Secondary | ICD-10-CM | POA: Diagnosis not present

## 2023-11-21 DIAGNOSIS — I5032 Chronic diastolic (congestive) heart failure: Secondary | ICD-10-CM | POA: Diagnosis not present

## 2023-11-21 DIAGNOSIS — K2970 Gastritis, unspecified, without bleeding: Secondary | ICD-10-CM

## 2023-11-21 HISTORY — PX: BIOPSY: SHX5522

## 2023-11-21 HISTORY — PX: ESOPHAGOGASTRODUODENOSCOPY: SHX5428

## 2023-11-21 HISTORY — PX: EUS: SHX5427

## 2023-11-21 HISTORY — DX: Scoliosis, unspecified: M41.9

## 2023-11-21 SURGERY — UPPER ENDOSCOPIC ULTRASOUND (EUS) RADIAL
Anesthesia: Monitor Anesthesia Care

## 2023-11-21 MED ORDER — SUCRALFATE 1 G PO TABS: 1.0000 g | ORAL_TABLET | Freq: Two times a day (BID) | ORAL | 1 refills | Status: DC

## 2023-11-21 MED ORDER — PROPOFOL 500 MG/50ML IV EMUL
INTRAVENOUS | Status: DC | PRN
  Administered 2023-11-21: 180 ug/kg/min via INTRAVENOUS

## 2023-11-21 MED ORDER — SODIUM CHLORIDE 0.9 % IV SOLN: INTRAVENOUS | Status: DC

## 2023-11-21 NOTE — Anesthesia Postprocedure Evaluation (Signed)
 Anesthesia Post Note  Patient: Cody Frazier  Procedure(s) Performed: UPPER ENDOSCOPIC ULTRASOUND (EUS) RADIAL BIOPSY ESOPHAGOGASTRODUODENOSCOPY (EGD)     Patient location during evaluation: PACU Anesthesia Type: MAC Level of consciousness: awake and alert Pain management: pain level controlled Vital Signs Assessment: post-procedure vital signs reviewed and stable Respiratory status: spontaneous breathing, nonlabored ventilation and respiratory function stable Cardiovascular status: stable and blood pressure returned to baseline Anesthetic complications: no   No notable events documented.  Last Vitals:  Vitals:   11/21/23 1311 11/21/23 1321  BP: 122/73 (!) 141/79  Pulse: 79 71  Resp: 19 13  Temp:    SpO2: 96% 97%                    Debby FORBES Like

## 2023-11-21 NOTE — Telephone Encounter (Signed)
 Lab orders entered  Reminder to call pt in 1 year for MRI

## 2023-11-21 NOTE — H&P (Signed)
 GASTROENTEROLOGY PROCEDURE H&P NOTE   Primary Care Physician: Garald Karlynn GAILS, MD  HPI: Cody Frazier is a 78 y.o. male who presents for EGD/EUS to evaluate dilated bile duct and pancreatic duct with recurring/persistent RUQ pain.  Past Medical History:  Diagnosis Date   Allergic rhinitis    Anemia, iron  deficiency    Arthritis    Atrial arrhythmia    Atrial fib/flutter, transient (HCC)    following prior surgeries x 2   Barrett's esophagus without dysplasia    BPH (benign prostatic hyperplasia)    Bright's disease    Chronic LBP    Closed fracture of right distal radius    DDD (degenerative disc disease)    Diverticulosis of colon    Duodenal stricture    Gastric outlet obstruction    GERD (gastroesophageal reflux disease)    Glaucoma    Hemorrhoids    History of kidney stones    Hyperlipidemia    Hyperplastic colonic polyp 01/2000   Hypertension    Nephrolithiasis    hx of B   Peptic ulcer disease with hemorrhage 08/2008   and [facility]. Pylori Ab negative   Renal cyst    Scoliosis    Sleep apnea with use of continuous positive airway pressure (CPAP)    Tubular adenoma of colon 03/2012   Wears glasses    Past Surgical History:  Procedure Laterality Date   APPENDECTOMY     BACK SURGERY  02/2003   L5   BILROTH I PROCEDURE  2011   Dr Sheldon   CLOSED REDUCTION WRIST FRACTURE Right 01/06/2019   Procedure: Closed Reduction Wrist/Pinning Wrist;  Surgeon: Lorretta Dess, MD;  Location: [facility] OR;  Service: Plastics;  Laterality: Right;   COLONOSCOPY     COLONOSCOPY WITH PROPOFOL  N/A 08/14/2020   Procedure: COLONOSCOPY WITH PROPOFOL ;  Surgeon: Teressa Toribio SQUIBB, MD;  Location: [facility] ENDOSCOPY;  Service: Endoscopy;  Laterality: N/A;   HIATAL HERNIA REPAIR     LAPAROTOMY N/A 05/10/2017   Procedure: EXPLORATORY LAPAROTOMY WITH ENTEROLYSIS;  Surgeon: Gladis Cough, MD;  Location: [facility] ORS;  Service: General;  Laterality: N/A;   LUMBAR FUSION  2009   SMALL INTESTINE SURGERY      TONSILLECTOMY     UPPER GASTROINTESTINAL ENDOSCOPY  07/26/2020   Current Facility-Administered Medications  Medication Dose Route Frequency Provider Last Rate Last Admin   0.9 %  sodium chloride  infusion   Intravenous Continuous Mansouraty, Aloha Raddle., MD        Current Facility-Administered Medications:    0.9 %  sodium chloride  infusion, , Intravenous, Continuous, Mansouraty, Aloha Raddle., MD Allergies  Allergen Reactions   Codeine Phosphate Swelling   Nsaids Other (See Comments)    ulcers   Adhesive [Tape] Rash    ekg strips   Family History  Problem Relation Age of Onset   Colon polyps Mother    Colon cancer Mother 22   Heart disease Mother    Colon polyps Father    Colon cancer Father 82   Prostate cancer Father    Stroke Father    Colon cancer Paternal Uncle 1   Colon cancer Paternal Uncle 54   Hypertension Other    Colon cancer Cousin    Drug abuse Sister        overdose   Stomach cancer Neg Hx    Esophageal cancer Neg Hx    Pancreatic cancer Neg Hx    Liver disease Neg Hx    Social History  Socioeconomic History   Marital status: Married    Spouse name: Jaiveer Panas   Number of children: 1   Years of education: Not on file   Highest education level: Not on file  Occupational History   Occupation: PASTOR    Employer: [employer]  Tobacco Use   Smoking status: Former   Smokeless tobacco: Never   Tobacco comments:    as a teenager  Vaping Use   Vaping status: Never Used  Substance and Sexual Activity   Alcohol use: No   Drug use: No   Sexual activity: Yes  Other Topics Concern   Not on file  Social History Narrative   Patient gets regular exercise   Married x 42 years   Social Drivers of Corporate Investment Banker Strain: Low Risk  (01/14/2023)   Overall Financial Resource Strain (CARDIA)    Difficulty of Paying Living Expenses: Not hard at all  Food Insecurity: No Food Insecurity (01/14/2023)   Hunger Vital Sign    Worried  About Running Out of Food in the Last Year: Never true    Ran Out of Food in the Last Year: Never true  Transportation Needs: No Transportation Needs (01/14/2023)   PRAPARE - Administrator, Civil Service (Medical): No    Lack of Transportation (Non-Medical): No  Physical Activity: Inactive (01/14/2023)   Exercise Vital Sign    Days of Exercise per Week: 0 days    Minutes of Exercise per Session: 0 min  Stress: No Stress Concern Present (01/14/2023)   [employer] of Occupational Health - Occupational Stress Questionnaire    Feeling of Stress : Not at all  Social Connections: Socially Integrated (01/14/2023)   Social Connection and Isolation Panel [NHANES]    Frequency of Communication with Friends and Family: More than three times a week    Frequency of Social Gatherings with Friends and Family: More than three times a week    Attends Religious Services: More than 4 times per year    Active Member of [employer] or Organizations: Yes    Attends Banker Meetings: More than 4 times per year    Marital Status: Married  Catering Manager Violence: Not At Risk (01/14/2023)   Humiliation, Afraid, Rape, and Kick questionnaire    Fear of Current or Ex-Partner: No    Emotionally Abused: No    Physically Abused: No    Sexually Abused: No    Physical Exam: Today's Vitals   11/12/23 1022 11/21/23 1103  BP:  (!) 145/72  Pulse:  79  Resp:  16  Temp:  97.6 F (36.4 C)  TempSrc:  Temporal  SpO2:  99%  Weight: 90.3 kg 90.7 kg  Height: 5' 5 (1.651 m) 5' 5 (1.651 m)  PainSc:  6    Body mass index is 33.28 kg/m. GEN: NAD EYE: Sclerae anicteric ENT: MMM CV: Non-tachycardic GI: Soft, NT/ND NEURO:  Alert & Oriented x 3  Lab Results: No results for input(s): WBC, HGB, HCT, PLT in the last 72 hours. BMET No results for input(s): NA, K, CL, CO2, GLUCOSE, BUN, CREATININE, CALCIUM  in the last 72 hours. LFT No results for input(s): PROT, ALBUMIN ,  AST, ALT, ALKPHOS, BILITOT, BILIDIR, IBILI in the last 72 hours. PT/INR No results for input(s): LABPROT, INR in the last 72 hours.   Impression / Plan: This is a 78 y.o.male who presents for EGD/EUS to evaluate dilated bile duct and pancreatic duct with recurring/persistent RUQ pain.  The risks of an EUS including intestinal perforation, bleeding, infection, aspiration, and medication effects were discussed as was the possibility it may not give a definitive diagnosis if a biopsy is performed.  When a biopsy of the pancreas is done as part of the EUS, there is an additional risk of pancreatitis at the rate of about 1-2%.  It was explained that procedure related pancreatitis is typically mild, although it can be severe and even life threatening, which is why we do not perform random pancreatic biopsies and only biopsy a lesion/area we feel is concerning enough to warrant the risk.  The risks and benefits of endoscopic evaluation/treatment were discussed with the patient and/or family; these include but are not limited to the risk of perforation, infection, bleeding, missed lesions, lack of diagnosis, severe illness requiring hospitalization, as well as anesthesia and sedation related illnesses.  The patient's history has been reviewed, patient examined, no change in status, and deemed stable for procedure.  The patient and/or family is agreeable to proceed.    Aloha Finner, MD [facility] Advanced [facility]oscopy Office # [phone]

## 2023-11-21 NOTE — Op Note (Signed)
 [facility] Patient Name: Cody Frazier Procedure Date: 11/21/2023 MRN: [id number] Attending MD: Aloha Finner , MD, [id number] Date of Birth: 25-Jan-1946 CSN: [id number] Age: 78 Admit Type: Ambulatory Procedure:                Upper EUS Indications:              Common bile duct dilation (acquired) seen on MRCP,                            Dilated pancreatic duct on MRCP, Abdominal distress                            in the right upper quadrant, Surveillance for                            malignancy due to personal history of Barrett's                            esophagus Providers:                Aloha Finner, MD, Olam Riedel, RN, Fairy [place], Technician Referring MD:             Karlynn GAILS. Plotnikov MD, MD Medicines:                Monitored Anesthesia Care Complications:            No immediate complications. Estimated Blood Loss:     Estimated blood loss was minimal. Procedure:                Pre-Anesthesia Assessment:                           - Prior to the procedure, a History and Physical                            was performed, and patient medications and                            allergies were reviewed. The patient's tolerance of                            previous anesthesia was also reviewed. The risks                            and benefits of the procedure and the sedation                            options and risks were discussed with the patient.                            All questions were answered, and informed consent  was obtained. Prior Anticoagulants: The patient has                            taken Eliquis  (apixaban ), last dose was 3 days                            prior to procedure. ASA Grade Assessment: III - A                            patient with severe systemic disease. After                            reviewing the risks and benefits, the patient was                             deemed in satisfactory condition to undergo the                            procedure.                           After obtaining informed consent, the endoscope was                            passed under direct vision. Throughout the                            procedure, the patient's blood pressure, pulse, and                            oxygen saturations were monitored continuously. The                            [id number]-[id number] ([id number]) Olympus endoscope was introduced                            through the mouth, and advanced to the jejunum. The                            [id number]-[id number] ([id number]) Olympus duodenoscope was                            introduced through the mouth, and advanced to the                            area of papilla. The [id number] ([id number]) Olympus                            linear ultrasound scope was introduced through the                            mouth, and advanced to the duodenum for ultrasound  examination from the stomach and duodenum. The                            upper EUS was accomplished without difficulty. The                            patient tolerated the procedure. Scope In: Scope Out: Findings:      ENDOSCOPIC FINDING: :      No gross lesions were noted in the proximal esophagus and in the mid       esophagus.      The esophagus and gastroesophageal junction were examined with white       light and narrow band imaging (NBI) from a forward view and retroflexed       position. There were esophageal mucosal changes consistent with       long-segment Barrett's esophagus distally. These changes involved the       mucosa at the upper extent of the gastric folds (39 cm from the       incisors) extending to the Z-line. Circumferential salmon-colored mucosa       was present from 37 to 39 cm and two tongues of salmon-colored mucosa       were present from 34 to 37 cm. The maximum longitudinal extent of these       esophageal  mucosal changes was 5 cm in length. Mucosa was biopsied with       a cold forceps for histology randomly at intervals of 2 cm from 34 to 39       cm from the incisors. A total of 3 specimen bottles were sent to       pathology.      A 3 cm hiatal hernia was present.      Evidence of a Nissen fundoplication was found in the cardia. The wrap       appeared loose. This was traversed.      A 3 cm paraesophageal hernia was also found.      A significant J-shaped deformity was found in the entire examined       stomach.      Evidence of a patent Billroth I gastroduodenostomy was found. A gastric       pouch was found. The gastroduodenal anastomosis was characterized by       healthy appearing mucosa. This was traversed.      Diffuse severe mucosal changes characterized by friability (with contact       bleeding) and granularity were found in the entire examined stomach.       Biopsies were taken with a cold forceps for histology and Helicobacter       pylori testing.      The major papilla was normal and found within 2 cm of the       gastroduodenostomy anastomosis from the Billroth I.      No other gross lesions were noted in the entire examined duodenum and       proximal jejunum, other than occasional diverticulosis.      ENDOSONOGRAPHIC FINDING: :      There was dilation in the common bile duct (10.5 mm). No evidence of       choledocholithiasis or biliary sludge. Gallbladder could not be       visualized.      Pancreatic parenchymal abnormalities were noted in the pancreatic head.  These consisted of atrophy.      The pancreatic duct had a dilated endosonographic appearance in the       pancreatic head (4.3 -> 3.1 mm).      Endosonographic imaging of the ampulla showed no intramural       (subepithelial) lesion.      Endosonographic imaging in the visualized portion of the liver showed no       mass.      Due to patient's anatomy of both fundoplication, hiatal hernia,        paraesophageal hernia, and prior Billroth I anatomy with significant       J-shaped stomach, complete visualization of the pancreatic body and tail       as well as the normal visualization of the left lobe of the liver and       celiac region was not able to be obtained with the patient in left       lateral position/supine position. Impression:               EGD impression:                           - No gross lesions in the proximal esophagus and in                            the mid esophagus.                           - Esophageal mucosal changes consistent with                            long-segment Barrett's esophagus found distally.                            Biopsied.                           - 3 cm hiatal hernia.                           - A Nissen fundoplication was found. The wrap                            appears loose.                           - Also a 3 cm paraesophageal hernia.                           - Post-surgical J-shaped deformity in the entire                            stomach.                           - Patent Billroth I gastroduodenostomy was found,                            characterized by healthy appearing mucosa.                           -  Friable (with contact bleeding) and granular                            mucosa in the stomach. Biopsied.                           - Normal major papilla (found within 2 cm of the                            gastroduodenostomy).                           - No other gross lesions in the entire examined                            duodenum and proximal small bowel other than                            occasional diverticulosis.                           EUS impression:                           - There was dilation in the common bile duct. No                            evidence of choledocholithiasis.                           - Pancreatic parenchymal abnormalities consisting                            of atrophy  were noted in the pancreatic head.                           - The pancreatic duct had a dilated endosonographic                            appearance in the pancreatic head.                           - No evidence of an intramural ampullary lesion.                           - Due to patient's anatomy of both fundoplication,                            hiatal hernia, paraesophageal hernia, and prior                            Billroth I anatomy with significant J-shaped                            stomach, complete visualization of the pancreatic  body and tail as well as the normal visualization                            of the left lobe of the liver and celiac region was                            not able to be obtained with the patient in left                            lateral position/supine position. Moderate Sedation:      Not Applicable - Patient had care per Anesthesia. Recommendation:           - The patient will be observed post-procedure,                            until all discharge criteria are met.                           - Discharge patient to home.                           - Patient has a contact number available for                            emergencies. The signs and symptoms of potential                            delayed complications were discussed with the                            patient. Return to normal activities tomorrow.                            Written discharge instructions were provided to the                            patient.                           - Resume previous diet.                           - Observe patient's clinical course.                           - Trend LFTs and amylase/lipase if patient has                            severe episode of right upper quadrant abdominal                            pain to see if there is any evidence of functional  disorder of the sphincter.                            - Recommend repeat MRI/MRCP in 1 year pending no                            other significant changes or lab test                            abnormalities. The patient has had biliary duct                            dilation for greater than 5 years based on previous                            imaging reports.                           - Continue PPI twice daily. Will initiate Carafate                             twice daily for 2 months. Suspect that significant                            gastritis (if not H. pylori related), will be a                            result of bile gastritis due to the history of the                            Billroth I anatomy and house short the duodenum                            portion is before the ampullary orifice.                           - May restart Eliquis  on 11/23/2023 to decrease risk                            of post interventional bleeding.                           - Continue present medications otherwise.                           - Follow-up pathology.                           - Surveillance upper endoscopy for Barrett's,                            pending patient's pathology likely in 3 years.                           -  The findings and recommendations were discussed                            with the patient.                           - The findings and recommendations were discussed                            with the patient's family. Procedure Code(s):        --- Professional ---                           [phone], Esophagogastroduodenoscopy, flexible,                            transoral; with endoscopic ultrasound examination                            limited to the esophagus, stomach or duodenum, and                            adjacent structures                           43239, Esophagogastroduodenoscopy, flexible,                            transoral; with biopsy, single or multiple Diagnosis Code(s):         --- Professional ---                           K22.70, Barrett's esophagus without dysplasia                           K44.9, Diaphragmatic hernia without obstruction or                            gangrene                           Z98.890, Other specified postprocedural states                           K91.89, Other postprocedural complications and                            disorders of digestive system                           Z98.0, Intestinal bypass and anastomosis status                           K92.2, Gastrointestinal hemorrhage, unspecified                           K31.89, Other diseases of stomach and duodenum  K83.8, Other specified diseases of biliary tract                           K86.9, Disease of pancreas, unspecified                           K86.89, Other specified diseases of pancreas                           R10.11, Right upper quadrant pain                           R93.3, Abnormal findings on diagnostic imaging of                            other parts of digestive tract CPT copyright 2022 American Medical Association. All rights reserved. The codes documented in this report are preliminary and upon coder review may  be revised to meet current compliance requirements. Aloha Finner, MD 11/21/2023 1:19:40 PM Number of Addenda: 0

## 2023-11-21 NOTE — Anesthesia Preprocedure Evaluation (Addendum)
 Anesthesia Evaluation  Patient identified by MRN, date of birth, ID band Patient awake    Reviewed: Allergy & Precautions, NPO status , Patient's Chart, lab work & pertinent test results, reviewed documented beta blocker date and time   History of Anesthesia Complications Negative for: history of anesthetic complications  Airway Mallampati: III  TM Distance: >3 FB Neck ROM: Limited    Dental  (+) Dental Advisory Given   Pulmonary sleep apnea and Continuous Positive Airway Pressure Ventilation , former smoker   Pulmonary exam normal        Cardiovascular hypertension, Pt. on home beta blockers and Pt. on medications pulmonary hypertensionNormal cardiovascular exam+ dysrhythmias Atrial Fibrillation + Valvular Problems/Murmurs AS    '23 TTE - EF 60 to 65%. Grade I diastolic dysfunction (impaired relaxation). No valve d/o     Neuro/Psych  PSYCHIATRIC DISORDERS Anxiety      Hearing loss     GI/Hepatic Neg liver ROS, PUD,GERD  Medicated and Controlled,,  Endo/Other  diabetes, Type 2   Obesity   Renal/GU negative Renal ROS     Musculoskeletal  (+) Arthritis ,    Abdominal   Peds  Hematology  On eliquis     Anesthesia Other Findings On GLP-1a   Reproductive/Obstetrics                             Anesthesia Physical Anesthesia Plan  ASA: 3  Anesthesia Plan: MAC   Post-op Pain Management: Minimal or no pain anticipated   Induction:   PONV Risk Score and Plan: 1 and Propofol  infusion and Treatment may vary due to age or medical condition  Airway Management Planned: Nasal Cannula and Natural Airway  Additional Equipment: None  Intra-op Plan:   Post-operative Plan:   Informed Consent: I have reviewed the patients History and Physical, chart, labs and discussed the procedure including the risks, benefits and alternatives for the proposed anesthesia with the patient or authorized  representative who has indicated his/her understanding and acceptance.       Plan Discussed with: CRNA and Anesthesiologist  Anesthesia Plan Comments:        Anesthesia Quick Evaluation

## 2023-11-21 NOTE — Telephone Encounter (Signed)
-----   Message from [facility] sent at 11/21/2023  1:33 PM EST ----- Regarding: Follow-up Cody Frazier, Since Dr. Aneita is retired, I will take over this patient's care. Place order for MRI/MRCP in 1 year. Also go ahead and place order for hepatic function panel/amylase/lipase, and pend them.  If the patient has a severe episode of right upper quadrant pain he is going to come into the lab to have these drawn and he will MyChart to let us  know of that he is coming. Thanks. GM

## 2023-11-21 NOTE — Anesthesia Procedure Notes (Signed)
 Procedure Name: MAC Date/Time: 11/21/2023 12:03 PM  Performed by: Delores Duwaine SAUNDERS, CRNAPre-anesthesia Checklist: Patient identified, Emergency Drugs available, Suction available and Patient being monitored Patient Re-evaluated:Patient Re-evaluated prior to induction Oxygen Delivery Method: Supernova nasal CPAP

## 2023-11-21 NOTE — Transfer of Care (Signed)
 Immediate Anesthesia Transfer of Care Note  Patient: Cody Frazier  Procedure(s) Performed: UPPER ENDOSCOPIC ULTRASOUND (EUS) RADIAL BIOPSY ESOPHAGOGASTRODUODENOSCOPY (EGD)  Patient Location: PACU  Anesthesia Type:MAC  Level of Consciousness: drowsy  Airway & Oxygen Therapy: Patient Spontanous Breathing  Post-op Assessment: Report given to RN  Post vital signs: Reviewed and stable  Last Vitals:  Vitals Value Taken Time  BP 111/70 11/21/23 1301  Temp 36.1 C 11/21/23 1301  Pulse 78 11/21/23 1308  Resp 15 11/21/23 1308  SpO2 96 % 11/21/23 1308  Vitals shown include unfiled device data.  Last Pain:  Vitals:   11/21/23 1301  TempSrc: Tympanic  PainSc: Asleep         Complications: No notable events documented.

## 2023-11-21 NOTE — Discharge Instructions (Signed)

## 2023-11-22 LAB — SURGICAL PATHOLOGY

## 2023-11-23 ENCOUNTER — Encounter: Payer: Self-pay | Admitting: Gastroenterology

## 2023-11-24 ENCOUNTER — Encounter (HOSPITAL_COMMUNITY): Payer: Self-pay | Admitting: Gastroenterology

## 2023-11-25 DIAGNOSIS — H40012 Open angle with borderline findings, low risk, left eye: Secondary | ICD-10-CM | POA: Diagnosis not present

## 2023-11-25 DIAGNOSIS — H401211 Low-tension glaucoma, right eye, mild stage: Secondary | ICD-10-CM | POA: Diagnosis not present

## 2023-11-26 ENCOUNTER — Ambulatory Visit (INDEPENDENT_AMBULATORY_CARE_PROVIDER_SITE_OTHER): Payer: Medicare PPO | Admitting: Internal Medicine

## 2023-11-26 ENCOUNTER — Encounter: Payer: Self-pay | Admitting: Internal Medicine

## 2023-11-26 VITALS — BP 110/70 | HR 81 | Temp 98.6°F | Ht 65.0 in | Wt 206.0 lb

## 2023-11-26 DIAGNOSIS — H6123 Impacted cerumen, bilateral: Secondary | ICD-10-CM

## 2023-11-26 DIAGNOSIS — M519 Unspecified thoracic, thoracolumbar and lumbosacral intervertebral disc disorder: Secondary | ICD-10-CM | POA: Diagnosis not present

## 2023-11-26 DIAGNOSIS — E1169 Type 2 diabetes mellitus with other specified complication: Secondary | ICD-10-CM

## 2023-11-26 DIAGNOSIS — M5442 Lumbago with sciatica, left side: Secondary | ICD-10-CM

## 2023-11-26 DIAGNOSIS — D500 Iron deficiency anemia secondary to blood loss (chronic): Secondary | ICD-10-CM

## 2023-11-26 DIAGNOSIS — M5441 Lumbago with sciatica, right side: Secondary | ICD-10-CM

## 2023-11-26 DIAGNOSIS — Z7984 Long term (current) use of oral hypoglycemic drugs: Secondary | ICD-10-CM | POA: Diagnosis not present

## 2023-11-26 DIAGNOSIS — E669 Obesity, unspecified: Secondary | ICD-10-CM | POA: Diagnosis not present

## 2023-11-26 DIAGNOSIS — G8929 Other chronic pain: Secondary | ICD-10-CM

## 2023-11-26 MED ORDER — OXYCODONE HCL 30 MG PO TABS: 30.0000 mg | ORAL_TABLET | Freq: Three times a day (TID) | ORAL | 0 refills | Status: DC | PRN

## 2023-11-26 MED ORDER — SEMAGLUTIDE (2 MG/DOSE) 8 MG/3ML ~~LOC~~ SOPN: 2.0000 mg | PEN_INJECTOR | SUBCUTANEOUS | 3 refills | Status: DC

## 2023-11-26 NOTE — Assessment & Plan Note (Signed)
Chronic LBP - 75% better on a new dose of Oxycodone

## 2023-11-26 NOTE — Assessment & Plan Note (Signed)
 Monitor CBC Iron rich foods

## 2023-11-26 NOTE — Progress Notes (Signed)
 Subjective:  Patient ID: Cody Frazier, male    DOB: 06/20/1946  Age: 78 y.o. MRN: [id number]  CC: Medical Management of Chronic Issues   HPI Cody Frazier presents for LBP, OA, CAD, DM  Outpatient Medications Prior to Visit  Medication Sig Dispense Refill   acetaminophen  (TYLENOL ) 500 MG tablet Take 1,000 mg by mouth every 6 (six) hours as needed for mild pain or moderate pain. Maximum daily dose of 3g     atorvastatin  (LIPITOR) 20 MG tablet TAKE 1 TABLET BY MOUTH EVERY DAY 90 tablet 3   cetirizine (ZYRTEC) 10 MG tablet Take 10 mg by mouth daily.     Cholecalciferol (VITAMIN D3) 1.25 MG (50000 UT) CAPS TAKE 1 CAPSULE BY MOUTH MONTHLY 3 capsule 3   ELIQUIS  2.5 MG TABS tablet TAKE 1 TABLET BY MOUTH TWICE A DAY 180 tablet 3   famotidine  (PEPCID ) 40 MG tablet TAKE 1 TABLET BY MOUTH EVERYDAY AT BEDTIME 30 tablet 11   fluticasone  (FLONASE ) 50 MCG/ACT nasal spray SPRAY 2 SPRAYS INTO EACH NOSTRIL EVERY DAY 48 mL 3   furosemide  (LASIX ) 40 MG tablet Take 1 tablet (40 mg total) by mouth every other day. Take 40mg  every other day. May take an extra dose if your weight is over 212 pounds. 60 tablet 3   hyoscyamine  (LEVSIN  SL) 0.125 MG SL tablet Place 1-2 tablets (0.125-0.25 mg total) under the tongue every 4 (four) hours as needed for cramping. 120 tablet 1   latanoprost  (XALATAN ) 0.005 % ophthalmic solution INSTILL 1 DROP BY OPHTHALMIC ROUTE AT BEDTIME INTO BOTH EYES 7.5 mL 1   metoprolol  succinate (TOPROL -XL) 25 MG 24 hr tablet TAKE 1 TABLET BY MOUTH EVERY DAY 90 tablet 3   naloxone  (NARCAN ) 0.4 MG/ML injection Use prn if severe oversedation with oxycodone      ondansetron  (ZOFRAN ) 4 MG tablet Take 1 tablet (4 mg total) by mouth every 8 (eight) hours as needed for nausea or vomiting. 21 tablet 1   pantoprazole  (PROTONIX ) 40 MG tablet TAKE 1 TABLET BY MOUTH TWICE A DAY 60 tablet 11   spironolactone  (ALDACTONE ) 25 MG tablet TAKE 1 TABLET BY MOUTH EVERY DAY 90 tablet 3   sucralfate  (CARAFATE ) 1 g  tablet Take 1 tablet (1 g total) by mouth 2 (two) times daily. 60 tablet 1   telmisartan  (MICARDIS ) 20 MG tablet Take 0.5 tablets (10 mg total) by mouth daily. 45 tablet 3   timolol  (TIMOPTIC ) 0.25 % ophthalmic solution Place 1 drop into both eyes 2 (two) times daily.      triamcinolone  cream (KENALOG ) 0.1 % Apply 1 application topically 3 (three) times daily. 80 g 3   oxycodone  (ROXICODONE ) 30 MG immediate release tablet Take 1 tablet (30 mg total) by mouth every 8 (eight) hours as needed for pain. 90 tablet 0   oxycodone  (ROXICODONE ) 30 MG immediate release tablet Take 1 tablet (30 mg total) by mouth every 8 (eight) hours as needed for pain. 90 tablet 0   Semaglutide , 1 MG/DOSE, (OZEMPIC , 1 MG/DOSE,) 4 MG/3ML SOPN INJECT 1MG  INTO THE SKIN ONCE A WEEK 6 mL 3   No facility-administered medications prior to visit.    ROS: Review of Systems  Constitutional:  Negative for appetite change, fatigue and unexpected weight change.  HENT:  Negative for congestion, nosebleeds, sneezing, sore throat and trouble swallowing.   Eyes:  Negative for itching and visual disturbance.  Respiratory:  Negative for cough.   Cardiovascular:  Negative for chest pain, palpitations and  leg swelling.  Gastrointestinal:  Negative for abdominal distention, blood in stool, diarrhea and nausea.  Genitourinary:  Negative for frequency and hematuria.  Musculoskeletal:  Positive for arthralgias, back pain, gait problem and neck stiffness. Negative for joint swelling and neck pain.  Skin:  Negative for rash.  Neurological:  Negative for dizziness, tremors, speech difficulty and weakness.  Psychiatric/Behavioral:  Negative for agitation, dysphoric mood and sleep disturbance. The patient is not nervous/anxious.     Objective:  BP 110/70 (BP Location: Left Arm, Patient Position: Sitting, Cuff Size: Normal)   Pulse 81   Temp 98.6 F (37 C) (Oral)   Ht 5' 5 (1.651 m)   Wt 206 lb (93.4 kg)   SpO2 93%   BMI 34.28 kg/m    BP Readings from Last 3 Encounters:  11/26/23 110/70  11/21/23 (!) 141/79  09/30/23 118/78    Wt Readings from Last 3 Encounters:  11/26/23 206 lb (93.4 kg)  11/21/23 200 lb (90.7 kg)  09/30/23 201 lb (91.2 kg)    Physical Exam Constitutional:      General: He is not in acute distress.    Appearance: He is well-developed. He is obese.     Comments: NAD  Eyes:     Conjunctiva/sclera: Conjunctivae normal.     Pupils: Pupils are equal, round, and reactive to light.  Neck:     Thyroid : No thyromegaly.     Vascular: No JVD.  Cardiovascular:     Rate and Rhythm: Normal rate and regular rhythm.     Heart sounds: Normal heart sounds. No murmur heard.    No friction rub. No gallop.  Pulmonary:     Effort: Pulmonary effort is normal. No respiratory distress.     Breath sounds: Normal breath sounds. No wheezing or rales.  Chest:     Chest wall: No tenderness.  Abdominal:     General: Bowel sounds are normal. There is no distension.     Palpations: Abdomen is soft. There is no mass.     Tenderness: There is no abdominal tenderness. There is no guarding or rebound.  Musculoskeletal:        General: Tenderness present. Normal range of motion.     Cervical back: Normal range of motion.  Lymphadenopathy:     Cervical: No cervical adenopathy.  Skin:    General: Skin is warm and dry.     Findings: No rash.  Neurological:     Mental Status: He is alert and oriented to person, place, and time. Mental status is at baseline.     Cranial Nerves: No cranial nerve deficit.     Motor: No abnormal muscle tone.     Coordination: Coordination abnormal.     Gait: Gait abnormal.     Deep Tendon Reflexes: Reflexes are normal and symmetric.  Psychiatric:        Behavior: Behavior normal.        Thought Content: Thought content normal.        Judgment: Judgment normal.   Antalgic gait; LS spine w/pain Using a walker  Lab Results  Component Value Date   WBC 6.9 07/03/2023   HGB 14.3  07/03/2023   HCT 44.0 07/03/2023   PLT 321.0 07/03/2023   GLUCOSE 96 09/03/2023   CHOL 196 11/20/2018   TRIG 269.0 (H) 11/20/2018   HDL 43.60 11/20/2018   LDLDIRECT 109.0 11/20/2018   LDLCALC 86 11/25/2017   ALT 9 09/03/2023   AST 12 09/03/2023   NA 141  09/03/2023   K 4.8 09/03/2023   CL 106 09/03/2023   CREATININE 1.09 09/03/2023   BUN 15 09/03/2023   CO2 27 09/03/2023   TSH 0.83 07/03/2023   PSA 0.16 11/20/2018   INR 1.1 08/12/2020   HGBA1C 5.8 01/02/2023    No results found.  Assessment & Plan:   Problem List Items Addressed This Visit     Iron  deficiency anemia - Primary   Monitor CBC Iron  rich foods      Lumbar disc disease   Chronic LBP - 75% better on a new dose of Oxycodone  Narcan  to have at home Continue with weight loss Potential benefits of a long term opioids use as well as potential risks (i.e. addiction risk, apnea etc) and complications (i.e. Somnolence, constipation and others) were explained to the patient and were aknowledged.       Cerumen impaction   R ear wx Try to use Debrox RTC if needs to be irrigated       Type 2 diabetes mellitus with obesity (HCC)   Continue with Ozempic . No change in dose - 1 mg/wk      Relevant Medications   Semaglutide , 2 MG/DOSE, 8 MG/3ML SOPN   Low back pain   Chronic LBP - 75% better on a new dose of Oxycodone       Relevant Medications   oxycodone  (ROXICODONE ) 30 MG immediate release tablet   oxycodone  (ROXICODONE ) 30 MG immediate release tablet      Meds ordered this encounter  Medications   Semaglutide , 2 MG/DOSE, 8 MG/3ML SOPN    Sig: Inject 2 mg as directed once a week.    Dispense:  3 mL    Refill:  3   oxycodone  (ROXICODONE ) 30 MG immediate release tablet    Sig: Take 1 tablet (30 mg total) by mouth every 8 (eight) hours as needed for pain.    Dispense:  90 tablet    Refill:  0    Please fill on or after 11/29/23   oxycodone  (ROXICODONE ) 30 MG immediate release tablet    Sig: Take 1  tablet (30 mg total) by mouth every 8 (eight) hours as needed for pain.    Dispense:  90 tablet    Refill:  0    Please fill on or after 12/29/2023      Follow-up: Return in about 2 months (around 01/24/2024) for a follow-up visit.  Marolyn Noel, MD

## 2023-11-26 NOTE — Assessment & Plan Note (Addendum)
 R ear wx Try to use Debrox RTC if needs to be irrigated

## 2023-11-26 NOTE — Assessment & Plan Note (Signed)
Chronic LBP - 75% better on a new dose of Oxycodone Narcan to have at home Continue with weight loss Potential benefits of a long term opioids use as well as potential risks (i.e. addiction risk, apnea etc) and complications (i.e. Somnolence, constipation and others) were explained to the patient and were aknowledged.

## 2023-11-26 NOTE — Assessment & Plan Note (Signed)
 Continue with Ozempic. No change in dose - 1 mg/wk

## 2023-12-02 ENCOUNTER — Ambulatory Visit: Payer: Medicare PPO | Admitting: Internal Medicine

## 2023-12-03 ENCOUNTER — Ambulatory Visit: Payer: Medicare PPO | Admitting: Internal Medicine

## 2023-12-04 DIAGNOSIS — I129 Hypertensive chronic kidney disease with stage 1 through stage 4 chronic kidney disease, or unspecified chronic kidney disease: Secondary | ICD-10-CM | POA: Diagnosis not present

## 2023-12-04 DIAGNOSIS — E559 Vitamin D deficiency, unspecified: Secondary | ICD-10-CM | POA: Diagnosis not present

## 2023-12-04 DIAGNOSIS — Z87442 Personal history of urinary calculi: Secondary | ICD-10-CM | POA: Diagnosis not present

## 2023-12-04 DIAGNOSIS — D509 Iron deficiency anemia, unspecified: Secondary | ICD-10-CM | POA: Diagnosis not present

## 2023-12-04 DIAGNOSIS — E669 Obesity, unspecified: Secondary | ICD-10-CM | POA: Diagnosis not present

## 2023-12-04 DIAGNOSIS — N182 Chronic kidney disease, stage 2 (mild): Secondary | ICD-10-CM | POA: Diagnosis not present

## 2023-12-04 DIAGNOSIS — N281 Cyst of kidney, acquired: Secondary | ICD-10-CM | POA: Diagnosis not present

## 2023-12-11 LAB — LAB REPORT - SCANNED
Creatinine, POC: 83.7 mg/dL
EGFR: 69

## 2023-12-22 ENCOUNTER — Other Ambulatory Visit: Payer: Self-pay | Admitting: Internal Medicine

## 2023-12-23 ENCOUNTER — Other Ambulatory Visit: Payer: Self-pay

## 2023-12-23 ENCOUNTER — Telehealth: Payer: Self-pay | Admitting: Internal Medicine

## 2023-12-23 MED ORDER — APIXABAN 2.5 MG PO TABS: 2.5000 mg | ORAL_TABLET | Freq: Two times a day (BID) | ORAL | 3 refills | Status: DC

## 2023-12-23 NOTE — Telephone Encounter (Signed)
Rx has been sent into pt's pharmacy.  

## 2023-12-23 NOTE — Telephone Encounter (Signed)
 Copied from CRM [id number]. Topic: Clinical - Prescription Issue >> Dec 23, 2023 10:45 AM Barney Boozer wrote: Reason for CRM: ELIQUIS  2.5 MG TABS tablet [Pharmacy Med Name: ELIQUIS  2.5 MG TABLET] >> Dec 23, 2023 10:46 AM Barney Boozer wrote: Patient states pharmacy sent him a message stating that Plotnikov, Lon Risser, MD doesn't want to refill this medication. Patient is wanting to know does he need to schedule an appointment in order to get this medication. Patient call back number is [phone]

## 2023-12-30 ENCOUNTER — Other Ambulatory Visit: Payer: Self-pay | Admitting: Cardiovascular Disease

## 2023-12-30 DIAGNOSIS — M419 Scoliosis, unspecified: Secondary | ICD-10-CM | POA: Diagnosis not present

## 2024-01-08 DIAGNOSIS — G4733 Obstructive sleep apnea (adult) (pediatric): Secondary | ICD-10-CM | POA: Diagnosis not present

## 2024-01-15 ENCOUNTER — Ambulatory Visit: Payer: Medicare PPO

## 2024-01-15 VITALS — Ht 65.0 in | Wt 206.0 lb

## 2024-01-15 DIAGNOSIS — Z0000 Encounter for general adult medical examination without abnormal findings: Secondary | ICD-10-CM

## 2024-01-15 NOTE — Patient Instructions (Addendum)
 Cody Frazier , Thank you for taking time to come for your Medicare Wellness Visit. I appreciate your ongoing commitment to your health goals. Please review the following plan we discussed and let me know if I can assist you in the future.   Referrals/Orders/Follow-Ups/Clinician Recommendations: Aim for 30 minutes of exercise or brisk walking, 6-8 glasses of water, and 5 servings of fruits and vegetables each day.   Managing Pain Without Opioids Opioids are strong medicines used to treat moderate to severe pain. For some people, especially those who have long-term (chronic) pain, opioids may not be the best choice for pain management due to: Side effects like nausea, constipation, and sleepiness. The risk of addiction (opioid use disorder). The longer you take opioids, the greater your risk of addiction. Pain that lasts for more than 3 months is called chronic pain. Managing chronic pain usually requires more than one approach and is often provided by a team of health care providers working together (multidisciplinary approach). Pain management may be done at a pain management center or pain clinic. How to manage pain without the use of opioids Use non-opioid medicines Non-opioid medicines for pain may include: Over-the-counter or prescription non-steroidal anti-inflammatory drugs (NSAIDs). These may be the first medicines used for pain. They work well for muscle and bone pain, and they reduce swelling. Acetaminophen. This over-the-counter medicine may work well for milder pain but not swelling. Antidepressants. These may be used to treat chronic pain. A certain type of antidepressant (tricyclics) is often used. These medicines are given in lower doses for pain than when used for depression. Anticonvulsants. These are usually used to treat seizures but may also reduce nerve (neuropathic) pain. Muscle relaxants. These relieve pain caused by sudden muscle tightening (spasms). You may also use a pain  medicine that is applied to the skin as a patch, cream, or gel (topical analgesic), such as a numbing medicine. These may cause fewer side effects than medicines taken by mouth. Do certain therapies as directed Some therapies can help with pain management. They include: Physical therapy. You will do exercises to gain strength and flexibility. A physical therapist may teach you exercises to move and stretch parts of your body that are weak, stiff, or painful. You can learn these exercises at physical therapy visits and practice them at home. Physical therapy may also involve: Massage. Heat wraps or applying heat or cold to affected areas. Electrical signals that interrupt pain signals (transcutaneous electrical nerve stimulation, TENS). Weak lasers that reduce pain and swelling (low-level laser therapy). Signals from your body that help you learn to regulate pain (biofeedback). Occupational therapy. This helps you to learn ways to function at home and work with less pain. Recreational therapy. This involves trying new activities or hobbies, such as a physical activity or drawing. Mental health therapy, including: Cognitive behavioral therapy (CBT). This helps you learn coping skills for dealing with pain. Acceptance and commitment therapy (ACT) to change the way you think and react to pain. Relaxation therapies, including muscle relaxation exercises and mindfulness-based stress reduction. Pain management counseling. This may be individual, family, or group counseling.  Receive medical treatments Medical treatments for pain management include: Nerve block injections. These may include a pain blocker and anti-inflammatory medicines. You may have injections: Near the spine to relieve chronic back or neck pain. Into joints to relieve back or joint pain. Into nerve areas that supply a painful area to relieve body pain. Into muscles (trigger point injections) to relieve some painful muscle  conditions. A medical device placed near your spine to help block pain signals and relieve nerve pain or chronic back pain (spinal cord stimulation device). Acupuncture. Follow these instructions at home Medicines Take over-the-counter and prescription medicines only as told by your health care provider. If you are taking pain medicine, ask your health care providers about possible side effects to watch out for. Do not drive or use heavy machinery while taking prescription opioid pain medicine. Lifestyle  Do not use drugs or alcohol to reduce pain. If you drink alcohol, limit how much you have to: 0-1 drink a day for women who are not pregnant. 0-2 drinks a day for men. Know how much alcohol is in a drink. In the [place] one drink equals one 12 oz bottle of beer (355 mL), one 5 oz glass of wine (148 mL), or one 1 oz glass of hard liquor (44 mL). Do not use any products that contain nicotine or tobacco. These products include cigarettes, chewing tobacco, and vaping devices, such as e-cigarettes. If you need help quitting, ask your health care provider. Eat a healthy diet and maintain a healthy weight. Poor diet and excess weight may make pain worse. Eat foods that are high in fiber. These include fresh fruits and vegetables, whole grains, and beans. Limit foods that are high in fat and processed sugars, such as fried and sweet foods. Exercise regularly. Exercise lowers stress and may help relieve pain. Ask your health care provider what activities and exercises are safe for you. If your health care provider approves, join an exercise class that combines movement and stress reduction. Examples include yoga and tai chi. Get enough sleep. Lack of sleep may make pain worse. Lower stress as much as possible. Practice stress reduction techniques as told by your therapist. General instructions Work with all your pain management providers to find the treatments that work best for you. You are an  important member of your pain management team. There are many things you can do to reduce pain on your own. Consider joining an online or in-person support group for people who have chronic pain. Keep all follow-up visits. This is important. Where to find more information You can find more information about managing pain without opioids from: American Academy of Pain Medicine: painmed.org Institute for Chronic Pain: instituteforchronicpain.org American Chronic Pain Association: theacpa.org Contact a health care provider if: You have side effects from pain medicine. Your pain gets worse or does not get better with treatments or home therapy. You are struggling with anxiety or depression. Summary Many types of pain can be managed without opioids. Chronic pain may respond better to pain management without opioids. Pain is best managed when you and a team of health care providers work together. Pain management without opioids may include non-opioid medicines, medical treatments, physical therapy, mental health therapy, and lifestyle changes. Tell your health care providers if your pain gets worse or is not being managed well enough. This information is not intended to replace advice given to you by your health care provider. Make sure you discuss any questions you have with your health care provider. Document Revised: 02/08/2021 Document Reviewed: 02/08/2021 Elsevier Patient Education  2024 [employer].  This is a list of the screening recommended for you and due dates:  Health Maintenance  Topic Date Due   Complete foot exam   Never done   Eye exam for diabetics  Never done   Yearly kidney health urinalysis for diabetes  Never done  Hepatitis C Screening  Never done   Hemoglobin A1C  07/03/2023   COVID-19 Vaccine (6 - 2024-25 season) 07/14/2023   Yearly kidney function blood test for diabetes  12/10/2024   Medicare Annual Wellness Visit  01/14/2025   Colon Cancer Screening  08/14/2025    DTaP/Tdap/Td vaccine (4 - Td or Tdap) 12/26/2028   Pneumonia Vaccine  Completed   Flu Shot  Completed   HPV Vaccine  Aged Out   Zoster (Shingles) Vaccine  Discontinued    Advanced directives: (Copy Requested) Please bring a copy of your health care power of attorney and living will to the office to be added to your chart at your convenience.  Next Medicare Annual Wellness Visit scheduled for next year: Yes - 01/2025

## 2024-01-15 NOTE — Progress Notes (Addendum)
 Subjective:   Cody Frazier is a 78 y.o. who presents for a Medicare Wellness preventive visit.  Visit Complete: Virtual I connected with  Cody Frazier on 01/15/24 by a audio enabled telemedicine application and verified that I am speaking with the correct person using two identifiers.  Patient Location: Home  Provider Location: Office/Clinic  I discussed the limitations of evaluation and management by telemedicine. The patient expressed understanding and agreed to proceed.  Vital Signs: Because this visit was a virtual/telehealth visit, some criteria may be missing or patient reported. Any vitals not documented were not able to be obtained and vitals that have been documented are patient reported.  VideoDeclined- This patient declined Librarian, academic. Therefore the visit was completed with audio only.  AWV Questionnaire: No: Patient Medicare AWV questionnaire was not completed prior to this visit.  Cardiac Risk Factors include: advanced age (>69men, >14 women);hypertension;diabetes mellitus;dyslipidemia;obesity (BMI >30kg/m2);male gender     Objective:    Today's Vitals   01/15/24 0944 01/15/24 0945  Weight: 206 lb (93.4 kg)   Height: 5\' 5"  (1.651 m)   PainSc:  7   PainLoc: Back    Body mass index is 34.28 kg/m.     01/15/2024    9:42 AM 11/21/2023   11:00 AM 01/14/2023    2:30 PM 05/12/2022   11:36 PM 12/25/2021    1:55 PM 12/21/2020   11:06 AM 08/14/2020    9:11 AM  Advanced Directives  Does Patient Have a Medical Advance Directive? Yes Yes Yes No Yes Yes Yes  Type of Estate agent of [place];Living will Living will Healthcare Power of [place];Living will  Living will;Healthcare Power of Attorney Living will;Healthcare Power of Attorney   Does patient want to make changes to medical advance directive?     No - Patient declined No - Patient declined   Copy of Healthcare Power of Attorney in Chart? No - copy requested   No - copy requested  No - copy requested    Would patient like information on creating a medical advance directive?    No - Patient declined       Current Medications (verified) Outpatient Encounter Medications as of 01/15/2024  Medication Sig   acetaminophen (TYLENOL) 500 MG tablet Take 1,000 mg by mouth every 6 (six) hours as needed for mild pain or moderate pain. Maximum daily dose of 3g   apixaban (ELIQUIS) 2.5 MG TABS tablet Take 1 tablet (2.5 mg total) by mouth 2 (two) times daily.   atorvastatin (LIPITOR) 20 MG tablet TAKE 1 TABLET BY MOUTH EVERY DAY   cetirizine (ZYRTEC) 10 MG tablet Take 10 mg by mouth daily.   Cholecalciferol (VITAMIN D3) 1.25 MG (50000 UT) CAPS TAKE 1 CAPSULE BY MOUTH MONTHLY   famotidine (PEPCID) 40 MG tablet TAKE 1 TABLET BY MOUTH EVERYDAY AT BEDTIME   fluticasone (FLONASE) 50 MCG/ACT nasal spray SPRAY 2 SPRAYS INTO EACH NOSTRIL EVERY DAY   furosemide (LASIX) 40 MG tablet Take 1 tablet (40 mg total) by mouth every other day. Take 40mg  every other day. May take an extra dose if your weight is over 212 pounds.   hyoscyamine (LEVSIN SL) 0.125 MG SL tablet Place 1-2 tablets (0.125-0.25 mg total) under the tongue every 4 (four) hours as needed for cramping.   latanoprost (XALATAN) 0.005 % ophthalmic solution INSTILL 1 DROP BY OPHTHALMIC Frazier AT BEDTIME INTO BOTH EYES   metoprolol succinate (TOPROL-XL) 25 MG 24 hr tablet TAKE 1  TABLET BY MOUTH EVERY DAY   naloxone (NARCAN) 0.4 MG/ML injection Use prn if severe oversedation with oxycodone   ondansetron (ZOFRAN) 4 MG tablet Take 1 tablet (4 mg total) by mouth every 8 (eight) hours as needed for nausea or vomiting.   oxycodone (ROXICODONE) 30 MG immediate release tablet Take 1 tablet (30 mg total) by mouth every 8 (eight) hours as needed for pain.   oxycodone (ROXICODONE) 30 MG immediate release tablet Take 1 tablet (30 mg total) by mouth every 8 (eight) hours as needed for pain.   pantoprazole (PROTONIX) 40 MG tablet TAKE 1  TABLET BY MOUTH TWICE A DAY   Semaglutide, 2 MG/DOSE, 8 MG/3ML SOPN Inject 2 mg as directed once a week.   spironolactone (ALDACTONE) 25 MG tablet TAKE 1 TABLET BY MOUTH EVERY DAY   sucralfate (CARAFATE) 1 g tablet Take 1 tablet (1 g total) by mouth 2 (two) times daily.   telmisartan (MICARDIS) 20 MG tablet Take 0.5 tablets (10 mg total) by mouth daily.   timolol (TIMOPTIC) 0.25 % ophthalmic solution Place 1 drop into both eyes 2 (two) times daily.    triamcinolone cream (KENALOG) 0.1 % Apply 1 application topically 3 (three) times daily.   No facility-administered encounter medications on file as of 01/15/2024.    Allergies (verified) Codeine phosphate, Nsaids, and Adhesive [tape]   History: Past Medical History:  Diagnosis Date   Allergic rhinitis    Anemia, iron deficiency    Arthritis    Atrial arrhythmia    Atrial fib/flutter, transient (HCC)    following prior surgeries x 2   Barrett's esophagus without dysplasia    BPH (benign prostatic hyperplasia)    Bright's disease    Chronic LBP    Closed fracture of right distal radius    DDD (degenerative disc disease)    Diverticulosis of colon    Duodenal stricture    Gastric outlet obstruction    GERD (gastroesophageal reflux disease)    Glaucoma    Hemorrhoids    History of kidney stones    Hyperlipidemia    Hyperplastic colonic polyp 01/2000   Hypertension    Nephrolithiasis    hx of B   Peptic ulcer disease with hemorrhage 08/2008   and [facility]. Pylori Ab negative   Renal cyst    Scoliosis    Sleep apnea with use of continuous positive airway pressure (CPAP)    Tubular adenoma of colon 03/2012   Wears glasses    Past Surgical History:  Procedure Laterality Date   APPENDECTOMY     BACK SURGERY  02/2003   L5   BILROTH I PROCEDURE  2011   Dr Michaell Cowing   BIOPSY  11/21/2023   Procedure: BIOPSY;  Surgeon: Lemar Lofty., MD;  Location: [facility];  Service: Gastroenterology;;   CLOSED REDUCTION WRIST FRACTURE  Right 01/06/2019   Procedure: Closed Reduction Wrist/Pinning Wrist;  Surgeon: Knute Neu, MD;  Location: [facility] OR;  Service: Plastics;  Laterality: Right;   COLONOSCOPY     COLONOSCOPY WITH PROPOFOL N/A 08/14/2020   Procedure: COLONOSCOPY WITH PROPOFOL;  Surgeon: Rachael Fee, MD;  Location: [facility] END[facility];  Service: Endoscopy;  Laterality: N/A;   ESOPHAGOGASTRODUODENOSCOPY N/A 11/21/2023   Procedure: ESOPHAGOGASTRODUODENOSCOPY (EGD);  Surgeon: Lemar Lofty., MD;  Location: [facility];  Service: Gastroenterology;  Laterality: N/A;   EUS N/A 11/21/2023   Procedure: UPPER ENDOSCOPIC ULTRASOUND (EUS) RADIAL;  Surgeon: Lemar Lofty., MD;  Location: [facility];  Service: Gastroenterology;  Laterality: N/A;   HIATAL HERNIA REPAIR     LAPAROTOMY N/A 05/10/2017   Procedure: EXPLORATORY LAPAROTOMY WITH ENTEROLYSIS;  Surgeon: Luretha Murphy, MD;  Location: [facility]S;  Service: General;  Laterality: N/A;   LUMBAR FUSION  2009   SMALL INTESTINE SURGERY     TONSILLECTOMY     UPPER GASTROINTESTINAL ENDOSCOPY  07/26/2020   Family History  Problem Relation Age of Onset   Colon polyps Mother    Colon cancer Mother 53   Heart disease Mother    Colon polyps Father    Colon cancer Father 18   Prostate cancer Father    Stroke Father    Colon cancer Paternal Uncle 64   Colon cancer Paternal Uncle 49   Hypertension Other    Colon cancer Cousin    Drug abuse Sister        overdose   Stomach cancer Neg Hx    Esophageal cancer Neg Hx    Pancreatic cancer Neg Hx    Liver disease Neg Hx    Social History   Socioeconomic History   Marital status: Married    Spouse name: Kerby Borner   Number of children: 1   Years of education: Not on file   Highest education level: Not on file  Occupational History   Occupation: PASTOR    Employer: [employer]  Tobacco Use   Smoking status: Former    Passive exposure: Past   Smokeless tobacco: Never   Tobacco comments:    as a  teenager  Vaping Use   Vaping status: Never Used  Substance and Sexual Activity   Alcohol use: No   Drug use: No   Sexual activity: Yes  Other Topics Concern   Not on file  Social History Narrative   Patient gets regular exercise   Married x 42 years   Social Drivers of Corporate investment banker Strain: Low Risk  (01/15/2024)   Overall Financial Resource Strain (CARDIA)    Difficulty of Paying Living Expenses: Not hard at all  Food Insecurity: No Food Insecurity (01/15/2024)   Hunger Vital Sign    Worried About Running Out of Food in the Last Year: Never true    Ran Out of Food in the Last Year: Never true  Transportation Needs: No Transportation Needs (01/15/2024)   PRAPARE - Administrator, Civil Service (Medical): No    Lack of Transportation (Non-Medical): No  Physical Activity: Insufficiently Active (01/15/2024)   Exercise Vital Sign    Days of Exercise per Week: 5 days    Minutes of Exercise per Session: 20 min  Stress: No Stress Concern Present (01/15/2024)   [employer] of Occupational Health - Occupational Stress Questionnaire    Feeling of Stress : Not at all  Social Connections: Moderately Isolated (01/15/2024)   Social Connection and Isolation Panel [NHANES]    Frequency of Communication with Friends and Family: More than three times a week    Frequency of Social Gatherings with Friends and Family: More than three times a week    Attends Religious Services: Never    Database administrator or Organizations: No    Attends Engineer, structural: Never    Marital Status: Married    Tobacco Counseling - Former Smoker as a teenager Counseling given - Yes   Clinical Intake:  Pre-visit preparation completed: Yes  Pain : 0-10 Pain Score: 7  Pain Location: Back Pain Orientation: Lower Pain Descriptors / Indicators: Constant  Pain Onset: More than a month ago Pain Frequency: Constant Pain Relieving Factors: Oxycodone/Tylenol  Pain Relieving  Factors: Oxycodone/Tylenol  BMI - recorded: 34.28 Nutritional Status: BMI > 30  Obese Nutritional Risks: None Diabetes: No  How often do you need to have someone help you when you read instructions, pamphlets, or other written materials from your doctor or pharmacy?: 1 - Never  Interpreter Needed?: No  Information entered by :: Hassell Halim, CMA   Activities of Daily Living     01/15/2024    9:49 AM  In your present state of health, do you have any difficulty performing the following activities:  Hearing? 0  Vision? 0  Difficulty concentrating or making decisions? 0  Walking or climbing stairs? 0  Dressing or bathing? 0  Doing errands, shopping? 0  Preparing Food and eating ? N  Using the Toilet? N  In the past six months, have you accidently leaked urine? N  Do you have problems with loss of bowel control? Y  Comment slightly - no Urology referral needed per pt  Managing your Medications? N  Managing your Finances? N  Housekeeping or managing your Housekeeping? N    Patient Care Team: Plotnikov, Georgina Quint, MD as PCP - General Croitoru, Rachelle Hora, MD as PCP - Cardiology (Cardiology) Hilda Lias, MD (Neurosurgery) Karie Soda, MD (General Surgery) Meryl Dare, MD (Inactive) (Gastroenterology) Marcine Matar, MD as Attending Physician (Urology) Othella Boyer, MD as Consulting Physician (Cardiology) [facility] as Consulting Physician (Ophthalmology) Erlene Quan, Vinnie Level, [facility] (Inactive) as Pharmacist (Pharmacist) Bedelia Person, MD as Consulting Physician (Neurosurgery)  Indicate any recent Medical Services you may have received from other than [facility] providers in the past year (date may be approximate).     Assessment:   This is a routine wellness examination for [place].  Hearing/Vision screen Hearing Screening - Comments:: Wears hearing aids - [facility] Vision Screening - Comments:: Wears rx glasses - up to date with routine eye  exams with Dr Harless Litten with [facility]   Goals Addressed               This Visit's Progress     Weight (lb) < 200 lb (90.7 kg) (pt-stated)   206 lb (93.4 kg)     Patient stated he wants to lose weight (about 20lbs).        Depression Screen     01/15/2024    9:51 AM 11/26/2023    1:48 PM 07/31/2023    1:21 PM 06/03/2023    3:07 PM 01/02/2023    3:33 PM 10/09/2022    1:43 PM 01/11/2022    2:04 PM  PHQ 2/9 Scores  PHQ - 2 Score 0 0 0 0 0 0 0  PHQ- 9 Score 0  0        Fall Risk     01/15/2024    9:53 AM 11/26/2023    1:48 PM 09/30/2023    2:42 PM 07/31/2023    1:21 PM 06/03/2023    3:07 PM  Fall Risk   Falls in the past year? 1 0 0 0 0  Number falls in past yr: 0 0 0 0 0  Injury with Fall? 0 0 0 0 0  Risk for fall due to : No Fall Risks;History of fall(s) No Fall Risks  No Fall Risks No Fall Risks  Follow up Falls evaluation completed;Falls prevention discussed Falls evaluation completed  Falls evaluation completed Falls evaluation completed  MEDICARE RISK AT HOME:  Medicare Risk at Home Any stairs in or around the home?: Yes If so, are there any without handrails?: No Home free of loose throw rugs in walkways, pet beds, electrical cords, etc?: Yes Adequate lighting in your home to reduce risk of falls?: Yes Life alert?: No Use of a cane, walker or w/c?: Yes (cane/walker) Grab bars in the bathroom?: Yes Shower chair or bench in shower?: Yes Elevated toilet seat or a handicapped toilet?: Yes  TIMED UP AND GO:  Was the test performed?  No  Cognitive Function: 6CIT completed    02/22/2017    1:25 PM  MMSE - Mini Mental State Exam  Orientation to time 5  Orientation to Place 5  Registration 3  Attention/ Calculation 3  Recall 3  Language- name 2 objects 2  Language- repeat 1  Language- follow 3 step command 3  Language- read & follow direction 1  Write a sentence 1  Copy design 1  Total score 28        01/15/2024    9:54 AM 01/14/2023    2:30 PM   6CIT Screen  What Year? 0 points 0 points  What month? 0 points 0 points  What time? 0 points 0 points  Count back from 20 0 points 0 points  Months in reverse 0 points 0 points  Repeat phrase 0 points 0 points  Total Score 0 points 0 points    Immunizations Immunization History  Administered Date(s) Administered   DT (Pediatric) 02/09/2011   Fluad Quad(high Dose 65+) 08/14/2020   Fluad Trivalent(High Dose 65+) 07/31/2023   Influenza Split 08/13/2011, 08/19/2012   Influenza Whole 09/26/2009, 08/01/2010   Influenza, High Dose Seasonal PF 08/25/2013, 10/04/2015, 08/22/2016, 08/23/2017, 08/19/2018, 07/27/2019, 08/07/2022   Influenza,inj,Quad PF,6+ Mos 08/25/2014   Influenza-Unspecified 08/12/2021   PFIZER Comirnaty(Gray Top)Covid-19 Tri-Sucrose Vaccine 08/07/2022   PFIZER(Purple Top)SARS-COV-2 Vaccination 12/09/2019, 12/30/2019, 08/28/2020, 08/12/2021   Pneumococcal Conjugate-13 11/22/2014   Pneumococcal Polysaccharide-23 11/19/2011   Td 05/26/2015   Tdap 12/26/2018   Zoster, Live 02/17/2008    Screening Tests Health Maintenance  Topic Date Due   FOOT EXAM  Never done   OPHTHALMOLOGY EXAM  Never done   Diabetic kidney evaluation - Urine ACR  Never done   Hepatitis C Screening  Never done   HEMOGLOBIN A1C  07/03/2023   COVID-19 Vaccine (6 - 2024-25 season) 07/14/2023   Diabetic kidney evaluation - eGFR measurement  12/10/2024   Medicare Annual Wellness (AWV)  01/14/2025   Colonoscopy  08/14/2025   DTaP/Tdap/Td (4 - Td or Tdap) 12/26/2028   Pneumonia Vaccine 60+ Years old  Completed   INFLUENZA VACCINE  Completed   HPV VACCINES  Aged Out   Zoster Vaccines- Shingrix  Discontinued    Health Maintenance  Health Maintenance Due  Topic Date Due   FOOT EXAM  Never done   OPHTHALMOLOGY EXAM  Never done   Diabetic kidney evaluation - Urine ACR  Never done   Hepatitis C Screening  Never done   HEMOGLOBIN A1C  07/03/2023   COVID-19 Vaccine (6 - 2024-25 season) 07/14/2023    Health Maintenance Items Addressed:01/15/2024  Foot Exam status: Completed w/PCP in 2024 and next appt w/PCP 01/2024   Additional Screening:  Vision Screening: Recommended annual ophthalmology exams for early detection of glaucoma and other disorders of the eye. Pt stated has an annual eye exam with [facility] - Dr Harless Litten  Dental Screening: Recommended annual dental exams for proper oral  hygiene  Community Resource Referral / Chronic Care Management: CRR required this visit?  No   CCM required this visit?  No     Plan:     I have personally reviewed and noted the following in the patient's chart:   Medical and social history Use of alcohol, tobacco or illicit drugs  Current medications and supplements including opioid prescriptions. Patient is currently taking opioid prescriptions. Information provided to patient regarding non-opioid alternatives. Patient advised to discuss non-opioid treatment plan with their provider. Functional ability and status Nutritional status Physical activity Advanced directives List of other physicians Hospitalizations, surgeries, and ER visits in previous 12 months Vitals Screenings to include cognitive, depression, and falls Referrals and appointments  In addition, I have reviewed and discussed with patient certain preventive protocols, quality metrics, and best practice recommendations. A written personalized care plan for preventive services as well as general preventive health recommendations were provided to patient.     Darreld Mclean, CMA   01/15/2024   After Visit Summary: (MyChart) Due to this being a telephonic visit, the after visit summary with patients personalized plan was offered to patient via MyChart   Notes: Nothing significant to report at this time.  Medical screening examination/treatment/procedure(s) were performed by non-physician practitioner and as supervising physician I was immediately available for  consultation/collaboration.  I agree with above. Jacinta Shoe, MD

## 2024-01-20 ENCOUNTER — Encounter: Payer: Self-pay | Admitting: Internal Medicine

## 2024-01-20 ENCOUNTER — Ambulatory Visit: Payer: Medicare PPO | Admitting: Internal Medicine

## 2024-01-20 VITALS — BP 102/68 | HR 83 | Temp 98.3°F | Ht 65.0 in | Wt 206.0 lb

## 2024-01-20 DIAGNOSIS — E1169 Type 2 diabetes mellitus with other specified complication: Secondary | ICD-10-CM | POA: Diagnosis not present

## 2024-01-20 DIAGNOSIS — E559 Vitamin D deficiency, unspecified: Secondary | ICD-10-CM | POA: Diagnosis not present

## 2024-01-20 DIAGNOSIS — R739 Hyperglycemia, unspecified: Secondary | ICD-10-CM

## 2024-01-20 DIAGNOSIS — Z6834 Body mass index (BMI) 34.0-34.9, adult: Secondary | ICD-10-CM | POA: Diagnosis not present

## 2024-01-20 DIAGNOSIS — Z7985 Long-term (current) use of injectable non-insulin antidiabetic drugs: Secondary | ICD-10-CM

## 2024-01-20 DIAGNOSIS — F419 Anxiety disorder, unspecified: Secondary | ICD-10-CM | POA: Diagnosis not present

## 2024-01-20 DIAGNOSIS — N182 Chronic kidney disease, stage 2 (mild): Secondary | ICD-10-CM | POA: Diagnosis not present

## 2024-01-20 DIAGNOSIS — E669 Obesity, unspecified: Secondary | ICD-10-CM | POA: Diagnosis not present

## 2024-01-20 DIAGNOSIS — I10 Essential (primary) hypertension: Secondary | ICD-10-CM | POA: Diagnosis not present

## 2024-01-20 LAB — MICROALBUMIN / CREATININE URINE RATIO
Creatinine,U: 92.2 mg/dL
Microalb Creat Ratio: 7.6 mg/g (ref 0.0–30.0)
Microalb, Ur: <0.7 mg/dL (ref 0.0–1.9)

## 2024-01-20 LAB — HEMOGLOBIN A1C: Hgb A1c MFr Bld: 5.6 % (ref 4.6–6.5)

## 2024-01-20 MED ORDER — OXYCODONE HCL 30 MG PO TABS: 30.0000 mg | ORAL_TABLET | Freq: Three times a day (TID) | ORAL | 0 refills | Status: DC | PRN

## 2024-01-20 NOTE — Assessment & Plan Note (Signed)
 On Vit D

## 2024-01-20 NOTE — Assessment & Plan Note (Signed)
Hydrate well ?Monitor GFR ?

## 2024-01-20 NOTE — Assessment & Plan Note (Signed)
Cont on Telmisartan, Lasix

## 2024-01-20 NOTE — Patient Instructions (Signed)
 Sauna blanket on [email or web address]

## 2024-01-20 NOTE — Progress Notes (Signed)
 Subjective:  Patient ID: Cody Frazier, male    DOB: 1946-09-04  Age: 78 y.o. MRN: [id number]  CC: Medical Management of Chronic Issues (2 MNTH F/U)   HPI Cody Frazier presents for   Outpatient Medications Prior to Visit  Medication Sig Dispense Refill   acetaminophen (TYLENOL) 500 MG tablet Take 1,000 mg by mouth every 6 (six) hours as needed for mild pain or moderate pain. Maximum daily dose of 3g     apixaban (ELIQUIS) 2.5 MG TABS tablet Take 1 tablet (2.5 mg total) by mouth 2 (two) times daily. 180 tablet 3   atorvastatin (LIPITOR) 20 MG tablet TAKE 1 TABLET BY MOUTH EVERY DAY 90 tablet 3   cetirizine (ZYRTEC) 10 MG tablet Take 10 mg by mouth daily.     Cholecalciferol (VITAMIN D3) 1.25 MG (50000 UT) CAPS TAKE 1 CAPSULE BY MOUTH MONTHLY 3 capsule 3   famotidine (PEPCID) 40 MG tablet TAKE 1 TABLET BY MOUTH EVERYDAY AT BEDTIME 30 tablet 11   fluticasone (FLONASE) 50 MCG/ACT nasal spray SPRAY 2 SPRAYS INTO EACH NOSTRIL EVERY DAY 48 mL 3   furosemide (LASIX) 40 MG tablet Take 1 tablet (40 mg total) by mouth every other day. Take 40mg  every other day. May take an extra dose if your weight is over 212 pounds. 60 tablet 3   hyoscyamine (LEVSIN SL) 0.125 MG SL tablet Place 1-2 tablets (0.125-0.25 mg total) under the tongue every 4 (four) hours as needed for cramping. 120 tablet 1   latanoprost (XALATAN) 0.005 % ophthalmic solution INSTILL 1 DROP BY OPHTHALMIC ROUTE AT BEDTIME INTO BOTH EYES 7.5 mL 1   metoprolol succinate (TOPROL-XL) 25 MG 24 hr tablet TAKE 1 TABLET BY MOUTH EVERY DAY 90 tablet 0   naloxone (NARCAN) 0.4 MG/ML injection Use prn if severe oversedation with oxycodone     ondansetron (ZOFRAN) 4 MG tablet Take 1 tablet (4 mg total) by mouth every 8 (eight) hours as needed for nausea or vomiting. 21 tablet 1   pantoprazole (PROTONIX) 40 MG tablet TAKE 1 TABLET BY MOUTH TWICE A DAY 60 tablet 11   Semaglutide, 2 MG/DOSE, 8 MG/3ML SOPN Inject 2 mg as directed once a week. 3 mL 3    spironolactone (ALDACTONE) 25 MG tablet TAKE 1 TABLET BY MOUTH EVERY DAY 90 tablet 3   sucralfate (CARAFATE) 1 g tablet Take 1 tablet (1 g total) by mouth 2 (two) times daily. 60 tablet 1   telmisartan (MICARDIS) 20 MG tablet Take 0.5 tablets (10 mg total) by mouth daily. 45 tablet 3   timolol (TIMOPTIC) 0.25 % ophthalmic solution Place 1 drop into both eyes 2 (two) times daily.      triamcinolone cream (KENALOG) 0.1 % Apply 1 application topically 3 (three) times daily. 80 g 3   oxycodone (ROXICODONE) 30 MG immediate release tablet Take 1 tablet (30 mg total) by mouth every 8 (eight) hours as needed for pain. 90 tablet 0   oxycodone (ROXICODONE) 30 MG immediate release tablet Take 1 tablet (30 mg total) by mouth every 8 (eight) hours as needed for pain. 90 tablet 0   No facility-administered medications prior to visit.    ROS: Review of Systems  Constitutional:  Positive for fatigue. Negative for appetite change and unexpected weight change.  HENT:  Negative for congestion, nosebleeds, sneezing, sore throat and trouble swallowing.   Eyes:  Negative for itching and visual disturbance.  Respiratory:  Negative for cough.   Cardiovascular:  Negative for  chest pain, palpitations and leg swelling.  Gastrointestinal:  Negative for abdominal distention, blood in stool, diarrhea and nausea.  Genitourinary:  Negative for frequency and hematuria.  Musculoskeletal:  Positive for arthralgias, back pain, gait problem and neck stiffness. Negative for joint swelling and neck pain.  Skin:  Negative for rash.  Neurological:  Negative for dizziness, tremors, speech difficulty and weakness.  Psychiatric/Behavioral:  Negative for agitation, dysphoric mood and sleep disturbance. The patient is not nervous/anxious.     Objective:  BP 102/68   Pulse 83   Temp 98.3 F (36.8 C) (Oral)   Ht 5\' 5"  (1.651 m)   Wt 206 lb (93.4 kg)   SpO2 96%   BMI 34.28 kg/m   BP Readings from Last 3 Encounters:   01/20/24 102/68  11/26/23 110/70  11/21/23 (!) 141/79    Wt Readings from Last 3 Encounters:  01/20/24 206 lb (93.4 kg)  01/15/24 206 lb (93.4 kg)  11/26/23 206 lb (93.4 kg)    Physical Exam Constitutional:      General: He is not in acute distress.    Appearance: He is well-developed. He is obese.     Comments: NAD  Eyes:     Conjunctiva/sclera: Conjunctivae normal.     Pupils: Pupils are equal, round, and reactive to light.  Neck:     Thyroid: No thyromegaly.     Vascular: No JVD.  Cardiovascular:     Rate and Rhythm: Normal rate and regular rhythm.     Heart sounds: Normal heart sounds. No murmur heard.    No friction rub. No gallop.  Pulmonary:     Effort: Pulmonary effort is normal. No respiratory distress.     Breath sounds: Normal breath sounds. No wheezing or rales.  Chest:     Chest wall: No tenderness.  Abdominal:     General: Bowel sounds are normal. There is no distension.     Palpations: Abdomen is soft. There is no mass.     Tenderness: There is no abdominal tenderness. There is no guarding or rebound.  Musculoskeletal:        General: No tenderness. Normal range of motion.     Cervical back: Normal range of motion.  Lymphadenopathy:     Cervical: No cervical adenopathy.  Skin:    General: Skin is warm and dry.     Findings: No rash.  Neurological:     Mental Status: He is alert and oriented to person, place, and time.     Cranial Nerves: No cranial nerve deficit.     Motor: No abnormal muscle tone.     Coordination: Coordination normal.     Gait: Gait normal.     Deep Tendon Reflexes: Reflexes are normal and symmetric.  Psychiatric:        Behavior: Behavior normal.        Thought Content: Thought content normal.        Judgment: Judgment normal.     Lab Results  Component Value Date   WBC 6.9 07/03/2023   HGB 14.3 07/03/2023   HCT 44.0 07/03/2023   PLT 321.0 07/03/2023   GLUCOSE 96 09/03/2023   CHOL 196 11/20/2018   TRIG 269.0 (H)  11/20/2018   HDL 43.60 11/20/2018   LDLDIRECT 109.0 11/20/2018   LDLCALC 86 11/25/2017   ALT 9 09/03/2023   AST 12 09/03/2023   NA 141 09/03/2023   K 4.8 09/03/2023   CL 106 09/03/2023   CREATININE 1.09 09/03/2023  BUN 15 09/03/2023   CO2 27 09/03/2023   TSH 0.83 07/03/2023   PSA 0.16 11/20/2018   INR 1.1 08/12/2020   HGBA1C 5.8 01/02/2023    No results found.  Assessment & Plan:   Problem List Items Addressed This Visit     Anxiety disorder   The uncontrolled pain is aggravating.  On Buspar.  Treat pain      Vitamin D deficiency   On Vit D      Essential hypertension   Cont on Telmisartan, Lasix      CRI (chronic renal insufficiency), stage 2 (mild)   Hydrate well Monitor GFR      Hyperglycemia - Primary   Relevant Orders   Urine microalbumin-creatinine with uACR   Hemoglobin A1c   Type 2 diabetes mellitus with obesity (HCC)   Continue with Ozempic. No change in dose - 1 mg/wk         Meds ordered this encounter  Medications   oxycodone (ROXICODONE) 30 MG immediate release tablet    Sig: Take 1 tablet (30 mg total) by mouth every 8 (eight) hours as needed for pain.    Dispense:  90 tablet    Refill:  0    Please fill on or after 02/27/2024   oxycodone (ROXICODONE) 30 MG immediate release tablet    Sig: Take 1 tablet (30 mg total) by mouth every 8 (eight) hours as needed for pain.    Dispense:  90 tablet    Refill:  0    Please fill on or after 01/28/24      Follow-up: Return in about 2 months (around 03/21/2024) for a follow-up visit.  Sonda Primes, MD

## 2024-01-20 NOTE — Assessment & Plan Note (Signed)
 Continue with Ozempic. No change in dose - 1 mg/wk

## 2024-01-20 NOTE — Assessment & Plan Note (Signed)
The uncontrolled pain is aggravating.  On Buspar.  Treat pain

## 2024-01-23 ENCOUNTER — Ambulatory Visit: Payer: Self-pay | Admitting: Internal Medicine

## 2024-01-23 ENCOUNTER — Other Ambulatory Visit: Payer: Self-pay | Admitting: Gastroenterology

## 2024-01-23 DIAGNOSIS — E1169 Type 2 diabetes mellitus with other specified complication: Secondary | ICD-10-CM

## 2024-01-23 NOTE — Telephone Encounter (Signed)
   Chief Complaint: Medication SE- nausea increased causing "dry heaves" : Ozempic and sucralfate  Symptoms: Patient states the increased dose of Ozempic and addition of sucralfate is really causing him difficulty. Patient states he has increased nausea and is having "dry heaves" with eating that are so uncomfortable he feels like ha has a knot in his throat. It got so bad ha had to leave restaurant recently.  Frequency: increased since addition of sucralfate  Pertinent Negatives: Patient denies vomiting Disposition: [] ED /[] Urgent Care (no appt availability in office) / [] Appointment(In office/virtual)/ []  [facility]/ [] Home Care/ [] Refused Recommended Disposition /[]  Mobile Bus/ [x]  Follow-up with PCP Additional Notes: Patient would like PCP advise about this problem- he has also reached out to kidney specialist who started the sucralfate    Copied from CRM [id number]. Topic: Clinical - Prescription Issue >> Jan 23, 2024 11:02 AM [place] J wrote: Reason for CRM: Ozempic and sucralfate (CARAFATE) 1 g is making patient patient feel sick. Feels like there's a knot in his throat. Reason for Disposition . [1] Caller has NON-URGENT medicine question about med that PCP prescribed AND [2] triager unable to answer question  Answer Assessment - Initial Assessment Questions 1. NAME of MEDICINE: "What medicine(s) are you calling about?"     Ozempic and sucralfate  2. QUESTION: "What is your question?" (e.g., double dose of medicine, side effect)     sucralfate - patient has been taking sucralfate for about 1 month- patient states he has been feeling increased nauseated and "dry heaving" makes it feel like something is in the throat- very uncomfortable. Patient states he was already a little Ozempic and this is worse 3. PRESCRIBER: "Who prescribed the medicine?" Reason: if prescribed by specialist, call should be referred to that group.     PCP 4. SYMPTOMS: "Do you have any symptoms?"  If Yes, ask: "What symptoms are you having?"  "How bad are the symptoms (e.g., mild, moderate, severe)     Increased nausea- patient has also reached out to kidney specialist who wrote Rx for sucralfate. Patient would like PCP advice on what he should do- does not want to have vomiting given his history of barrett's esophagus and surgeries.  Protocols used: Medication Question Call-A-AH

## 2024-01-27 ENCOUNTER — Ambulatory Visit: Payer: Medicare PPO | Admitting: Internal Medicine

## 2024-01-27 MED ORDER — SEMAGLUTIDE (1 MG/DOSE) 4 MG/3ML ~~LOC~~ SOPN: 1.0000 mg | PEN_INJECTOR | SUBCUTANEOUS | 3 refills | Status: DC

## 2024-01-27 MED ORDER — SEMAGLUTIDE (1 MG/DOSE) 4 MG/3ML ~~LOC~~ SOPN
1.0000 mg | PEN_INJECTOR | SUBCUTANEOUS | 3 refills | Status: DC
Start: 2024-01-27 — End: 2024-01-27

## 2024-01-27 MED ORDER — ONDANSETRON HCL 4 MG PO TABS: 4.0000 mg | ORAL_TABLET | Freq: Three times a day (TID) | ORAL | 1 refills | Status: AC | PRN

## 2024-01-27 NOTE — Addendum Note (Signed)
 Addended by: Delsa Grana R on: 01/27/2024 04:49 PM   Modules accepted: Orders

## 2024-01-27 NOTE — Addendum Note (Signed)
 Addended by: Tresa Garter on: 01/27/2024 07:59 AM   Modules accepted: Orders

## 2024-01-27 NOTE — Telephone Encounter (Signed)
 Spoke with the pt and was able to inform him of the provider's instructions. Pt has stated understanding.

## 2024-01-27 NOTE — Telephone Encounter (Signed)
 Go back to Ozempic 1 mg/week Use Zofran as needed nausea/dry heaves-prescription sent.  Thank you

## 2024-01-28 ENCOUNTER — Telehealth: Payer: Self-pay | Admitting: Cardiovascular Disease

## 2024-01-28 NOTE — Telephone Encounter (Signed)
 Eliquis 2.5mg  refill request received. Patient is 78 years old, weight-93.4kg, Crea-1.09 on 09/03/23, Diagnosis-Afib, and last seen by Dr. Tresa Endo on 03/27/23.  Dose inappropriate per dosing criteria and 03/27/23 OV note states: He was on Eliquis for anticoagulation and atorvastatin for hyperlipidemia. He has iron deficiency anemia and has had issues with bleeding leading to reduction in his Eliquis dose down to 2.5 mg twice a day.   This medication refill was last sent on 12/23/23 on by PCP with 90 day supply with refills the requested pharmacy. Called the pharmacy and was told that they have the refill for the pt but they are low in stock on this medication. She states she will get the refill ready for the pt since they have one bottle that they can prepare for the pt.  Called the pt to update him and he was appreciative of the call and will follow up with pharmacy. No refill sent at this time.

## 2024-01-28 NOTE — Telephone Encounter (Signed)
*  STAT* If patient is at the pharmacy, call can be transferred to refill team.   1. Which medications need to be refilled? (please list name of each medication and dose if known) apixaban (ELIQUIS) 2.5 MG TABS tablet   2. Which pharmacy/location (including street and city if local pharmacy) is medication to be sent to?  [id number] - [place], [place] - [address]      3. Do they need a 30 day or 90 day supply? 90 day  Pt is out of medication

## 2024-03-11 ENCOUNTER — Other Ambulatory Visit: Payer: Self-pay | Admitting: Cardiovascular Disease

## 2024-03-18 ENCOUNTER — Ambulatory Visit: Admitting: Internal Medicine

## 2024-03-18 ENCOUNTER — Encounter: Payer: Self-pay | Admitting: Internal Medicine

## 2024-03-18 ENCOUNTER — Other Ambulatory Visit: Payer: Self-pay | Admitting: Internal Medicine

## 2024-03-18 VITALS — BP 120/78 | HR 90 | Temp 97.7°F | Ht 65.0 in | Wt 206.0 lb

## 2024-03-18 DIAGNOSIS — D500 Iron deficiency anemia secondary to blood loss (chronic): Secondary | ICD-10-CM | POA: Diagnosis not present

## 2024-03-18 DIAGNOSIS — Z7985 Long-term (current) use of injectable non-insulin antidiabetic drugs: Secondary | ICD-10-CM

## 2024-03-18 DIAGNOSIS — M519 Unspecified thoracic, thoracolumbar and lumbosacral intervertebral disc disorder: Secondary | ICD-10-CM

## 2024-03-18 DIAGNOSIS — E1169 Type 2 diabetes mellitus with other specified complication: Secondary | ICD-10-CM

## 2024-03-18 DIAGNOSIS — I10 Essential (primary) hypertension: Secondary | ICD-10-CM | POA: Diagnosis not present

## 2024-03-18 DIAGNOSIS — E669 Obesity, unspecified: Secondary | ICD-10-CM

## 2024-03-18 MED ORDER — SEMAGLUTIDE (1 MG/DOSE) 4 MG/3ML ~~LOC~~ SOPN: 1.0000 mg | PEN_INJECTOR | SUBCUTANEOUS | 5 refills | Status: DC

## 2024-03-18 MED ORDER — OXYCODONE HCL 30 MG PO TABS: 30.0000 mg | ORAL_TABLET | Freq: Three times a day (TID) | ORAL | 0 refills | Status: DC | PRN

## 2024-03-18 MED ORDER — FLUTICASONE PROPIONATE 50 MCG/ACT NA SUSP: 2.0000 | Freq: Every day | NASAL | 3 refills | Status: AC

## 2024-03-18 NOTE — Assessment & Plan Note (Signed)
Cont on Telmisartan, Lasix

## 2024-03-18 NOTE — Assessment & Plan Note (Signed)
 Monitor CBC Iron rich foods

## 2024-03-18 NOTE — Progress Notes (Signed)
 Subjective:  Patient ID: Cody Frazier, male    DOB: 14-Sep-1946  Age: 78 y.o. MRN: [id number]  CC: Medical Management of Chronic Issues (2 month follow up. Patient notes worsening seasonal allergies. Would like to know if Dr.Demi Trieu would recommend patient starting back fluticasone . Refill on controlled medication)   HPI Cody Frazier presents for LBP, DM, HTN Pt fell 1 month ago - fell over a cat  Outpatient Medications Prior to Visit  Medication Sig Dispense Refill   acetaminophen  (TYLENOL ) 500 MG tablet Take 1,000 mg by mouth every 6 (six) hours as needed for mild pain or moderate pain. Maximum daily dose of 3g     apixaban  (ELIQUIS ) 2.5 MG TABS tablet Take 1 tablet (2.5 mg total) by mouth 2 (two) times daily. 180 tablet 3   atorvastatin  (LIPITOR) 20 MG tablet TAKE 1 TABLET BY MOUTH EVERY DAY 90 tablet 3   cetirizine (ZYRTEC) 10 MG tablet Take 10 mg by mouth daily.     Cholecalciferol (VITAMIN D3) 1.25 MG (50000 UT) CAPS TAKE 1 CAPSULE BY MOUTH MONTHLY 3 capsule 3   famotidine  (PEPCID ) 40 MG tablet TAKE 1 TABLET BY MOUTH EVERYDAY AT BEDTIME 30 tablet 11   furosemide  (LASIX ) 40 MG tablet Take 1 tablet (40 mg total) by mouth every other day. Take 40mg  every other day. May take an extra dose if your weight is over 212 pounds. 60 tablet 3   hyoscyamine  (LEVSIN SL) 0.125 MG SL tablet Place 1-2 tablets (0.125-0.25 mg total) under the tongue every 4 (four) hours as needed for cramping. 120 tablet 1   latanoprost  (XALATAN ) 0.005 % ophthalmic solution INSTILL 1 DROP BY OPHTHALMIC ROUTE AT BEDTIME INTO BOTH EYES 7.5 mL 1   metoprolol  succinate (TOPROL -XL) 25 MG 24 hr tablet TAKE 1 TABLET BY MOUTH EVERY DAY 90 tablet 0   naloxone  (NARCAN ) 0.4 MG/ML injection Use prn if severe oversedation with oxycodone      ondansetron  (ZOFRAN ) 4 MG tablet Take 1 tablet (4 mg total) by mouth every 8 (eight) hours as needed for nausea or vomiting. 21 tablet 1   pantoprazole  (PROTONIX ) 40 MG tablet TAKE 1  TABLET BY MOUTH TWICE A DAY 60 tablet 11   spironolactone  (ALDACTONE ) 25 MG tablet TAKE 1 TABLET BY MOUTH EVERY DAY 90 tablet 3   sucralfate  (CARAFATE ) 1 g tablet TAKE 1 TABLET BY MOUTH 2 TIMES DAILY. 60 tablet 1   telmisartan  (MICARDIS ) 20 MG tablet TAKE 1 TABLET BY MOUTH EVERY DAY 90 tablet 3   timolol  (TIMOPTIC ) 0.25 % ophthalmic solution Place 1 drop into both eyes 2 (two) times daily.      triamcinolone  cream (KENALOG ) 0.1 % Apply 1 application topically 3 (three) times daily. 80 g 3   oxycodone  (ROXICODONE ) 30 MG immediate release tablet Take 1 tablet (30 mg total) by mouth every 8 (eight) hours as needed for pain. 90 tablet 0   oxycodone  (ROXICODONE ) 30 MG immediate release tablet Take 1 tablet (30 mg total) by mouth every 8 (eight) hours as needed for pain. 90 tablet 0   Semaglutide , 1 MG/DOSE, 4 MG/3ML SOPN Inject 1 mg as directed once a week. 3 mL 3   fluticasone  (FLONASE ) 50 MCG/ACT nasal spray SPRAY 2 SPRAYS INTO EACH NOSTRIL EVERY DAY (Patient not taking: Reported on 03/18/2024) 48 mL 3   No facility-administered medications prior to visit.    ROS: Review of Systems  Constitutional:  Negative for appetite change, fatigue and unexpected weight change.  HENT:  Negative for congestion, nosebleeds, sneezing, sore throat and trouble swallowing.   Eyes:  Negative for itching and visual disturbance.  Respiratory:  Negative for cough.   Cardiovascular:  Negative for chest pain, palpitations and leg swelling.  Gastrointestinal:  Negative for abdominal distention, blood in stool, diarrhea and nausea.  Genitourinary:  Negative for frequency and hematuria.  Musculoskeletal:  Positive for back pain and gait problem. Negative for joint swelling and neck pain.  Skin:  Negative for rash and wound.  Neurological:  Negative for dizziness, tremors, speech difficulty and weakness.  Psychiatric/Behavioral:  Negative for agitation, dysphoric mood and sleep disturbance. The patient is not  nervous/anxious.     Objective:  BP 120/78   Pulse 90   Temp 97.7 F (36.5 C)   Ht 5\' 5"  (1.651 m)   Wt 206 lb (93.4 kg)   SpO2 98%   BMI 34.28 kg/m   BP Readings from Last 3 Encounters:  03/18/24 120/78  01/20/24 102/68  11/26/23 110/70    Wt Readings from Last 3 Encounters:  03/18/24 206 lb (93.4 kg)  01/20/24 206 lb (93.4 kg)  01/15/24 206 lb (93.4 kg)    Physical Exam Constitutional:      General: He is not in acute distress.    Appearance: He is well-developed. He is obese.     Comments: NAD  Eyes:     Conjunctiva/sclera: Conjunctivae normal.     Pupils: Pupils are equal, round, and reactive to light.  Neck:     Thyroid : No thyromegaly.     Vascular: No JVD.  Cardiovascular:     Rate and Rhythm: Normal rate and regular rhythm.     Heart sounds: Normal heart sounds. No murmur heard.    No friction rub. No gallop.  Pulmonary:     Effort: Pulmonary effort is normal. No respiratory distress.     Breath sounds: Normal breath sounds. No wheezing or rales.  Chest:     Chest wall: No tenderness.  Abdominal:     General: Bowel sounds are normal. There is no distension.     Palpations: Abdomen is soft. There is no mass.     Tenderness: There is no abdominal tenderness. There is no guarding or rebound.  Musculoskeletal:        General: Tenderness present. Normal range of motion.     Cervical back: Normal range of motion.     Right lower leg: No edema.     Left lower leg: No edema.  Lymphadenopathy:     Cervical: No cervical adenopathy.  Skin:    General: Skin is warm and dry.     Findings: No rash.  Neurological:     Mental Status: He is alert and oriented to person, place, and time.     Cranial Nerves: No cranial nerve deficit.     Motor: No abnormal muscle tone.     Coordination: Coordination normal.     Gait: Gait normal.     Deep Tendon Reflexes: Reflexes are normal and symmetric.  Psychiatric:        Behavior: Behavior normal.        Thought  Content: Thought content normal.        Judgment: Judgment normal.     Lab Results  Component Value Date   WBC 6.9 07/03/2023   HGB 14.3 07/03/2023   HCT 44.0 07/03/2023   PLT 321.0 07/03/2023   GLUCOSE 96 09/03/2023   CHOL 196 11/20/2018   TRIG 269.0 (H) 11/20/2018  HDL 43.60 11/20/2018   LDLDIRECT 109.0 11/20/2018   LDLCALC 86 11/25/2017   ALT 9 09/03/2023   AST 12 09/03/2023   NA 141 09/03/2023   K 4.8 09/03/2023   CL 106 09/03/2023   CREATININE 1.09 09/03/2023   BUN 15 09/03/2023   CO2 27 09/03/2023   TSH 0.83 07/03/2023   PSA 0.16 11/20/2018   INR 1.1 08/12/2020   HGBA1C 5.6 01/20/2024   MICROALBUR <0.7 01/20/2024    No results found.  Assessment & Plan:   Problem List Items Addressed This Visit     Iron  deficiency anemia - Primary   Monitor CBC Iron  rich foods      Lumbar disc disease    Chronic LBP - better on a higher dose of Oxycodone       Essential hypertension   Cont on Telmisartan , Lasix       Type 2 diabetes mellitus with obesity (HCC)   Continue with Ozempic . No change in dose - 1 mg/wk      Relevant Medications   Semaglutide , 1 MG/DOSE, 4 MG/3ML SOPN      Meds ordered this encounter  Medications   fluticasone  (FLONASE ) 50 MCG/ACT nasal spray    Sig: Place 2 sprays into both nostrils daily.    Dispense:  48 mL    Refill:  3   oxycodone  (ROXICODONE ) 30 MG immediate release tablet    Sig: Take 1 tablet (30 mg total) by mouth every 8 (eight) hours as needed for pain.    Dispense:  90 tablet    Refill:  0    Please fill on or after 03/28/2024   oxycodone  (ROXICODONE ) 30 MG immediate release tablet    Sig: Take 1 tablet (30 mg total) by mouth every 8 (eight) hours as needed for pain.    Dispense:  90 tablet    Refill:  0    Please fill on or after 04/27/24   Semaglutide , 1 MG/DOSE, 4 MG/3ML SOPN    Sig: Inject 1 mg as directed once a week.    Dispense:  3 mL    Refill:  5      Follow-up: Return for a follow-up visit.  Anitra Barn, MD

## 2024-03-18 NOTE — Assessment & Plan Note (Signed)
 Continue with Ozempic. No change in dose - 1 mg/wk

## 2024-03-18 NOTE — Assessment & Plan Note (Signed)
  Chronic LBP - better on a higher dose of Oxycodone

## 2024-03-19 ENCOUNTER — Telehealth: Payer: Self-pay | Admitting: Internal Medicine

## 2024-03-19 NOTE — Telephone Encounter (Signed)
 Copied from CRM [id number]. Topic: Clinical - Prescription Issue >> Mar 19, 2024  8:59 AM Cody Frazier wrote: Reason for CRM: Patient called in stating that the pharmacy will not fill prescription for oxycodone  (ROXICODONE ) 30 MG immediate release tablet until May 17th unless Dr Georgia Kipper calls the pharmacy and let them know that its okay to fill.They are saying that it is to soon to fill.Patient stated that he filled it last on the 8th of April.

## 2024-03-20 MED ORDER — OXYCODONE HCL 30 MG PO TABS
30.0000 mg | ORAL_TABLET | Freq: Three times a day (TID) | ORAL | 0 refills | Status: DC | PRN
Start: 2024-03-20 — End: 2024-03-27

## 2024-03-20 MED ORDER — OXYCODONE HCL 30 MG PO TABS: 30.0000 mg | ORAL_TABLET | Freq: Three times a day (TID) | ORAL | 0 refills | Status: DC | PRN

## 2024-03-20 NOTE — Telephone Encounter (Signed)
 I am sorry.  Prescriptions were resent to the pharmacy.  Thanks

## 2024-03-23 ENCOUNTER — Ambulatory Visit: Admitting: Internal Medicine

## 2024-03-27 ENCOUNTER — Ambulatory Visit: Attending: Cardiovascular Disease | Admitting: Cardiovascular Disease

## 2024-03-27 VITALS — BP 113/75 | HR 78 | Ht 65.0 in | Wt 201.4 lb

## 2024-03-27 DIAGNOSIS — G4733 Obstructive sleep apnea (adult) (pediatric): Secondary | ICD-10-CM | POA: Diagnosis not present

## 2024-03-27 DIAGNOSIS — I2781 Cor pulmonale (chronic): Secondary | ICD-10-CM

## 2024-03-27 DIAGNOSIS — I2721 Secondary pulmonary arterial hypertension: Secondary | ICD-10-CM

## 2024-03-27 DIAGNOSIS — I350 Nonrheumatic aortic (valve) stenosis: Secondary | ICD-10-CM

## 2024-03-27 DIAGNOSIS — D500 Iron deficiency anemia secondary to blood loss (chronic): Secondary | ICD-10-CM | POA: Diagnosis not present

## 2024-03-27 DIAGNOSIS — E7800 Pure hypercholesterolemia, unspecified: Secondary | ICD-10-CM

## 2024-03-27 DIAGNOSIS — I5032 Chronic diastolic (congestive) heart failure: Secondary | ICD-10-CM | POA: Diagnosis not present

## 2024-03-27 DIAGNOSIS — D6869 Other thrombophilia: Secondary | ICD-10-CM

## 2024-03-27 DIAGNOSIS — I480 Paroxysmal atrial fibrillation: Secondary | ICD-10-CM

## 2024-03-27 DIAGNOSIS — I700 Atherosclerosis of aorta: Secondary | ICD-10-CM

## 2024-03-27 DIAGNOSIS — I10 Essential (primary) hypertension: Secondary | ICD-10-CM

## 2024-03-27 MED ORDER — METOPROLOL SUCCINATE ER 25 MG PO TB24: 25.0000 mg | ORAL_TABLET | Freq: Every day | ORAL | 3 refills | Status: AC

## 2024-03-27 NOTE — Patient Instructions (Signed)
 Medication Instructions:  Refilled Metoprolol    *If you need a refill on your cardiac medications before your next appointment, please call your pharmacy*   Follow-Up: At [facility], you and your health needs are our priority.  As part of our continuing mission to provide you with exceptional heart care, our providers are all part of one team.  This team includes your primary Cardiologist (physician) and Advanced Practice Providers or APPs (Physician Assistants and Nurse Practitioners) who all work together to provide you with the care you need, when you need it.  Your next appointment:   1 year(s)  Provider:   Luana Rumple, MD
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
## 2024-04-14 DIAGNOSIS — G4733 Obstructive sleep apnea (adult) (pediatric): Secondary | ICD-10-CM | POA: Diagnosis not present

## 2024-05-25 DIAGNOSIS — H401211 Low-tension glaucoma, right eye, mild stage: Secondary | ICD-10-CM | POA: Diagnosis not present

## 2024-05-25 DIAGNOSIS — H40012 Open angle with borderline findings, low risk, left eye: Secondary | ICD-10-CM | POA: Diagnosis not present

## 2024-06-01 DIAGNOSIS — Z23 Encounter for immunization: Secondary | ICD-10-CM | POA: Diagnosis not present

## 2024-06-01 DIAGNOSIS — S51852A Open bite of left forearm, initial encounter: Secondary | ICD-10-CM | POA: Diagnosis not present

## 2024-06-01 DIAGNOSIS — W5501XA Bitten by cat, initial encounter: Secondary | ICD-10-CM | POA: Diagnosis not present

## 2024-06-15 ENCOUNTER — Encounter: Payer: Self-pay | Admitting: Internal Medicine

## 2024-06-15 ENCOUNTER — Ambulatory Visit (INDEPENDENT_AMBULATORY_CARE_PROVIDER_SITE_OTHER): Admitting: Internal Medicine

## 2024-06-15 VITALS — BP 150/89 | HR 83 | Temp 98.8°F | Ht 65.0 in | Wt 202.0 lb

## 2024-06-15 DIAGNOSIS — F419 Anxiety disorder, unspecified: Secondary | ICD-10-CM

## 2024-06-15 DIAGNOSIS — M5441 Lumbago with sciatica, right side: Secondary | ICD-10-CM | POA: Diagnosis not present

## 2024-06-15 DIAGNOSIS — Z7985 Long-term (current) use of injectable non-insulin antidiabetic drugs: Secondary | ICD-10-CM

## 2024-06-15 DIAGNOSIS — M5442 Lumbago with sciatica, left side: Secondary | ICD-10-CM

## 2024-06-15 DIAGNOSIS — K2270 Barrett's esophagus without dysplasia: Secondary | ICD-10-CM

## 2024-06-15 DIAGNOSIS — G8929 Other chronic pain: Secondary | ICD-10-CM

## 2024-06-15 DIAGNOSIS — E669 Obesity, unspecified: Secondary | ICD-10-CM

## 2024-06-15 DIAGNOSIS — E1169 Type 2 diabetes mellitus with other specified complication: Secondary | ICD-10-CM | POA: Diagnosis not present

## 2024-06-15 DIAGNOSIS — N182 Chronic kidney disease, stage 2 (mild): Secondary | ICD-10-CM

## 2024-06-15 LAB — COMPREHENSIVE METABOLIC PANEL WITH GFR
ALT: 11 U/L (ref 0–53)
AST: 14 U/L (ref 0–37)
Albumin: 4.3 g/dL (ref 3.5–5.2)
Alkaline Phosphatase: 80 U/L (ref 39–117)
BUN: 19 mg/dL (ref 6–23)
CO2: 27 meq/L (ref 19–32)
Calcium: 10 mg/dL (ref 8.4–10.5)
Chloride: 103 meq/L (ref 96–112)
Creatinine, Ser: 1.01 mg/dL (ref 0.40–1.50)
GFR: 71.11 mL/min (ref >60.00)
Glucose, Bld: 106 mg/dL — ABNORMAL HIGH (ref 70–99)
Potassium: 4.3 meq/L (ref 3.5–5.1)
Sodium: 139 meq/L (ref 135–145)
Total Bilirubin: 0.4 mg/dL (ref 0.2–1.2)
Total Protein: 7.1 g/dL (ref 6.0–8.3)

## 2024-06-15 LAB — HEMOGLOBIN A1C: Hgb A1c MFr Bld: 5.6 % (ref 4.6–6.5)

## 2024-06-15 MED ORDER — OXYCODONE HCL 30 MG PO TABS: 30.0000 mg | ORAL_TABLET | Freq: Three times a day (TID) | ORAL | 0 refills | Status: DC | PRN

## 2024-06-15 NOTE — Assessment & Plan Note (Signed)
 Continue with Ozempic. No change in dose - 1 mg/wk

## 2024-06-15 NOTE — Assessment & Plan Note (Signed)
Hydrate well ?Monitor GFR ?
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
## 2024-06-15 NOTE — Assessment & Plan Note (Signed)
Pantoprazole EGD q 5 years

## 2024-06-15 NOTE — Assessment & Plan Note (Signed)
The uncontrolled pain is aggravating.  On Buspar.  Treat pain

## 2024-06-15 NOTE — Assessment & Plan Note (Signed)
 Chronic LBP - 75% better on a new dose of Oxycodone  Narcan  to have at home Continue with weight loss

## 2024-06-16 ENCOUNTER — Ambulatory Visit: Payer: Self-pay | Admitting: Internal Medicine

## 2024-06-18 ENCOUNTER — Ambulatory Visit: Admitting: Internal Medicine

## 2024-07-03 ENCOUNTER — Ambulatory Visit: Payer: Self-pay

## 2024-07-03 DIAGNOSIS — M79674 Pain in right toe(s): Secondary | ICD-10-CM | POA: Diagnosis not present

## 2024-07-03 DIAGNOSIS — L089 Local infection of the skin and subcutaneous tissue, unspecified: Secondary | ICD-10-CM | POA: Diagnosis not present

## 2024-07-03 DIAGNOSIS — B351 Tinea unguium: Secondary | ICD-10-CM | POA: Diagnosis not present

## 2024-07-03 NOTE — Telephone Encounter (Signed)
 FYI Only or Action Required?: Action required by provider: Advised urgent care/ ED, no available appts today.  Patient was last seen in primary care on 06/15/2024 by Plotnikov, Karlynn GAILS, MD.  Called Nurse Triage reporting infection.  Symptoms began today.  Interventions attempted: Nothing.  Symptoms are: unchanged.  Triage Disposition: See Physician Within 24 Hours  Patient/caregiver understands and will follow disposition?: Yes     Copied from CRM #[id number]. Topic: Clinical - Red Word Triage >> Jul 03, 2024  1:45 PM Shereese L wrote: [facility] that prompted transfer to Nurse Triage: patient has neuropathyToes are bending over, 2nd toe nails turning black, foot very sore, Foot swollen Reason for Disposition  [1] Red area or streak AND [2] no fever    Denies fever, drainage. Only redness on tip of toe  Answer Assessment - Initial Assessment Questions Advised urgent care/ ED, no available appts within clinic. Patient states will go to urgent care.   1. LOCATION: Where is the wound located?      Right toe, 2nd digit 2. WOUND APPEARANCE: What does the wound look like?      Toenail black and hard, redness only tip of toe 3. SIZE: If redness is present, ask: What is the size of the red area? (Inches, centimeters, or compare to size of a coin)      Only redness noted on tip of toe, does not blanch,  denies any redness, swelling or hot to touch of foot, leg 4. SPREAD: What's changed in the last day?  Do you see any red streaks coming from the wound?     no 5. ONSET: When did it start to look infected?  Just noticed today  7. PAIN: Is there any pain? If Yes, ask: How bad is the pain?   (Scale 1-10; or mild, moderate, severe)     6/10; feels sore and swollen 8. FEVER: Do you have a fever? If Yes, ask: What is your temperature, how was it measured, and when did it start?     no 9. OTHER SYMPTOMS: Do you have any other symptoms? (e.g., shaking chills, weakness,  rash elsewhere on body)     Denies fever, chills, weakness  Protocols used: Wound Infection-A-AH

## 2024-07-08 DIAGNOSIS — L03031 Cellulitis of right toe: Secondary | ICD-10-CM | POA: Diagnosis not present

## 2024-07-08 DIAGNOSIS — M2041 Other hammer toe(s) (acquired), right foot: Secondary | ICD-10-CM | POA: Diagnosis not present

## 2024-07-08 DIAGNOSIS — L84 Corns and callosities: Secondary | ICD-10-CM | POA: Diagnosis not present

## 2024-07-16 DIAGNOSIS — G4733 Obstructive sleep apnea (adult) (pediatric): Secondary | ICD-10-CM | POA: Diagnosis not present

## 2024-07-22 DIAGNOSIS — L84 Corns and callosities: Secondary | ICD-10-CM | POA: Diagnosis not present

## 2024-07-22 DIAGNOSIS — L03031 Cellulitis of right toe: Secondary | ICD-10-CM | POA: Diagnosis not present

## 2024-07-22 DIAGNOSIS — M2041 Other hammer toe(s) (acquired), right foot: Secondary | ICD-10-CM | POA: Diagnosis not present

## 2024-07-28 ENCOUNTER — Encounter: Payer: Self-pay | Admitting: Internal Medicine

## 2024-07-28 ENCOUNTER — Ambulatory Visit: Admitting: Internal Medicine

## 2024-07-28 ENCOUNTER — Telehealth: Payer: Self-pay | Admitting: Cardiovascular Disease

## 2024-07-28 VITALS — BP 134/90 | HR 72 | Temp 98.5°F | Ht 65.0 in | Wt 203.0 lb

## 2024-07-28 DIAGNOSIS — F419 Anxiety disorder, unspecified: Secondary | ICD-10-CM | POA: Diagnosis not present

## 2024-07-28 DIAGNOSIS — M5441 Lumbago with sciatica, right side: Secondary | ICD-10-CM | POA: Diagnosis not present

## 2024-07-28 DIAGNOSIS — K2270 Barrett's esophagus without dysplasia: Secondary | ICD-10-CM | POA: Diagnosis not present

## 2024-07-28 DIAGNOSIS — M5442 Lumbago with sciatica, left side: Secondary | ICD-10-CM | POA: Diagnosis not present

## 2024-07-28 DIAGNOSIS — G8929 Other chronic pain: Secondary | ICD-10-CM | POA: Diagnosis not present

## 2024-07-28 DIAGNOSIS — I5032 Chronic diastolic (congestive) heart failure: Secondary | ICD-10-CM | POA: Diagnosis not present

## 2024-07-28 DIAGNOSIS — R413 Other amnesia: Secondary | ICD-10-CM | POA: Diagnosis not present

## 2024-07-28 DIAGNOSIS — I480 Paroxysmal atrial fibrillation: Secondary | ICD-10-CM

## 2024-07-28 DIAGNOSIS — Z23 Encounter for immunization: Secondary | ICD-10-CM

## 2024-07-28 MED ORDER — OXYCODONE HCL 30 MG PO TABS: 30.0000 mg | ORAL_TABLET | Freq: Three times a day (TID) | ORAL | 0 refills | Status: DC | PRN

## 2024-07-28 NOTE — Assessment & Plan Note (Signed)
?  worse We can add Aricept On Lion's mane Neurology ref and formal testing was offered

## 2024-07-28 NOTE — Assessment & Plan Note (Signed)
Cont on anticoagulation - Eliquis ?

## 2024-07-28 NOTE — Assessment & Plan Note (Signed)
Loose wt NAS diet

## 2024-07-28 NOTE — Assessment & Plan Note (Signed)
 Chronic LBP - 75% better on a new dose of Oxycodone  Narcan  to have at home Continue with weight loss

## 2024-07-28 NOTE — Telephone Encounter (Signed)
 Called patient and patient verbalizes he was able to get appointment on Friday with Dr. Shlomo. Per patient he will follow up with Dr. Francyne if needed after his appointment scheduled on 9/18. Made patient aware that Dr. Francyne will be made aware. Pt verbalized an understanding.

## 2024-07-28 NOTE — Assessment & Plan Note (Signed)
The uncontrolled pain is aggravating.  On Buspar.  Treat pain

## 2024-07-28 NOTE — Progress Notes (Signed)
 Subjective:  Patient ID: Cody Frazier, male    DOB: September 03, 1946  Age: 78 y.o. MRN: [id number]  CC: Medical Management of Chronic Issues (Wants Flu vaccine. Wants to discuss covid vaccine. )   HPI Cody Frazier presents for back pain, memory issues - worse, A fib  Outpatient Medications Prior to Visit  Medication Sig Dispense Refill   acetaminophen  (TYLENOL ) 500 MG tablet Take 1,000 mg by mouth every 6 (six) hours as needed for mild pain or moderate pain. Maximum daily dose of 3g     apixaban  (ELIQUIS ) 2.5 MG TABS tablet Take 1 tablet (2.5 mg total) by mouth 2 (two) times daily. 180 tablet 3   atorvastatin  (LIPITOR) 20 MG tablet TAKE 1 TABLET BY MOUTH EVERY DAY 90 tablet 3   cetirizine (ZYRTEC) 10 MG tablet Take 10 mg by mouth daily.     Cholecalciferol (VITAMIN D3) 1.25 MG (50000 UT) CAPS TAKE 1 CAPSULE BY MOUTH MONTHLY 3 capsule 3   famotidine  (PEPCID ) 40 MG tablet TAKE 1 TABLET BY MOUTH EVERYDAY AT BEDTIME 30 tablet 11   fluticasone  (FLONASE ) 50 MCG/ACT nasal spray Place 2 sprays into both nostrils daily. 48 mL 3   furosemide  (LASIX ) 40 MG tablet Take 1 tablet (40 mg total) by mouth every other day. Take 40mg  every other day. May take an extra dose if your weight is over 212 pounds. 60 tablet 3   latanoprost  (XALATAN ) 0.005 % ophthalmic solution INSTILL 1 DROP BY OPHTHALMIC ROUTE AT BEDTIME INTO BOTH EYES 7.5 mL 1   metoprolol  succinate (TOPROL -XL) 25 MG 24 hr tablet Take 1 tablet (25 mg total) by mouth daily. 90 tablet 3   naloxone  (NARCAN ) 0.4 MG/ML injection Use prn if severe oversedation with oxycodone      ondansetron  (ZOFRAN ) 4 MG tablet Take 1 tablet (4 mg total) by mouth every 8 (eight) hours as needed for nausea or vomiting. 21 tablet 1   pantoprazole  (PROTONIX ) 40 MG tablet TAKE 1 TABLET BY MOUTH TWICE A DAY 60 tablet 11   Semaglutide , 1 MG/DOSE, 4 MG/3ML SOPN Inject 1 mg as directed once a week. 3 mL 5   spironolactone  (ALDACTONE ) 25 MG tablet TAKE 1 TABLET BY MOUTH EVERY  DAY 90 tablet 3   sucralfate  (CARAFATE ) 1 g tablet TAKE 1 TABLET BY MOUTH 2 TIMES DAILY. 60 tablet 1   telmisartan  (MICARDIS ) 20 MG tablet TAKE 1 TABLET BY MOUTH EVERY DAY 90 tablet 3   timolol  (TIMOPTIC ) 0.25 % ophthalmic solution Place 1 drop into both eyes 2 (two) times daily.      triamcinolone  cream (KENALOG ) 0.1 % Apply 1 application topically 3 (three) times daily. 80 g 3   oxycodone  (ROXICODONE ) 30 MG immediate release tablet Take 1 tablet (30 mg total) by mouth every 8 (eight) hours as needed for pain. 90 tablet 0   hyoscyamine  (LEVSIN  SL) 0.125 MG SL tablet Place 1-2 tablets (0.125-0.25 mg total) under the tongue every 4 (four) hours as needed for cramping. (Patient not taking: Reported on 07/28/2024) 120 tablet 1   No facility-administered medications prior to visit.    ROS: Review of Systems  Constitutional:  Negative for appetite change, fatigue and unexpected weight change.  HENT:  Negative for congestion, nosebleeds, sneezing, sore throat and trouble swallowing.   Eyes:  Negative for itching and visual disturbance.  Respiratory:  Negative for cough.   Cardiovascular:  Negative for chest pain, palpitations and leg swelling.  Gastrointestinal:  Negative for abdominal distention, blood in stool,  diarrhea and nausea.  Genitourinary:  Negative for frequency and hematuria.  Musculoskeletal:  Positive for back pain and gait problem. Negative for joint swelling and neck pain.  Skin:  Negative for rash.  Neurological:  Negative for dizziness, tremors, speech difficulty and weakness.  Psychiatric/Behavioral:  Positive for decreased concentration. Negative for agitation, dysphoric mood, sleep disturbance and suicidal ideas. The patient is not nervous/anxious.     Objective:  BP (!) 134/90   Pulse 72   Temp 98.5 F (36.9 C) (Oral)   Ht 5' 5 (1.651 m)   Wt 203 lb (92.1 kg)   SpO2 96%   BMI 33.78 kg/m   BP Readings from Last 3 Encounters:  07/28/24 (!) 134/90  06/15/24 (!)  150/89  03/27/24 113/75    Wt Readings from Last 3 Encounters:  07/28/24 203 lb (92.1 kg)  06/15/24 202 lb (91.6 kg)  03/27/24 201 lb 6.4 oz (91.4 kg)    Physical Exam Constitutional:      General: He is not in acute distress.    Appearance: He is well-developed. He is obese.     Comments: NAD  Eyes:     Conjunctiva/sclera: Conjunctivae normal.     Pupils: Pupils are equal, round, and reactive to light.  Neck:     Thyroid : No thyromegaly.     Vascular: No JVD.  Cardiovascular:     Rate and Rhythm: Normal rate and regular rhythm.     Heart sounds: Normal heart sounds. No murmur heard.    No friction rub. No gallop.  Pulmonary:     Effort: Pulmonary effort is normal. No respiratory distress.     Breath sounds: Normal breath sounds. No wheezing or rales.  Chest:     Chest wall: No tenderness.  Abdominal:     General: Bowel sounds are normal. There is no distension.     Palpations: Abdomen is soft. There is no mass.     Tenderness: There is no abdominal tenderness. There is no guarding or rebound.  Musculoskeletal:        General: Tenderness present. Normal range of motion.     Cervical back: Normal range of motion.     Right lower leg: Edema present.     Left lower leg: Edema present.  Lymphadenopathy:     Cervical: No cervical adenopathy.  Skin:    General: Skin is warm and dry.     Findings: No rash.  Neurological:     Mental Status: He is alert and oriented to person, place, and time.     Cranial Nerves: No cranial nerve deficit.     Motor: No abnormal muscle tone.     Coordination: Coordination normal.     Gait: Gait abnormal.     Deep Tendon Reflexes: Reflexes are normal and symmetric.  Psychiatric:        Behavior: Behavior normal.        Thought Content: Thought content normal.        Judgment: Judgment normal.   LS w/pain   Lab Results  Component Value Date   WBC 6.9 07/03/2023   HGB 14.3 07/03/2023   HCT 44.0 07/03/2023   PLT 321.0 07/03/2023    GLUCOSE 106 (H) 06/15/2024   CHOL 196 11/20/2018   TRIG 269.0 (H) 11/20/2018   HDL 43.60 11/20/2018   LDLDIRECT 109.0 11/20/2018   LDLCALC 86 11/25/2017   ALT 11 06/15/2024   AST 14 06/15/2024   NA 139 06/15/2024   K 4.3 06/15/2024  CL 103 06/15/2024   CREATININE 1.01 06/15/2024   BUN 19 06/15/2024   CO2 27 06/15/2024   TSH 0.83 07/03/2023   PSA 0.16 11/20/2018   INR 1.1 08/12/2020   HGBA1C 5.6 06/15/2024   MICROALBUR <0.7 01/20/2024    No results found.  Assessment & Plan:   Problem List Items Addressed This Visit     Anxiety disorder   The uncontrolled pain is aggravating.  On Buspar .  Treat pain      Barrett's esophagus without dysplasia   On Protonix       Chronic diastolic CHF (congestive heart failure) (HCC)   Loose wt NAS diet      Low back pain   Chronic LBP - 75% better on a new dose of Oxycodone  Narcan  to have at home Continue with weight loss      Relevant Medications   oxycodone  (ROXICODONE ) 30 MG immediate release tablet   Memory difficulties   ?worse We can add Aricept On Lion's mane Neurology ref and formal testing was offered      Paroxysmal atrial fibrillation (HCC)   Cont on anticoagulation - Eliquis       Other Visit Diagnoses       Immunization due    -  Primary   Relevant Orders   Flu vaccine HIGH DOSE PF(Fluzone Trivalent) (Completed)         Meds ordered this encounter  Medications   oxycodone  (ROXICODONE ) 30 MG immediate release tablet    Sig: Take 1 tablet (30 mg total) by mouth every 8 (eight) hours as needed for pain.    Dispense:  90 tablet    Refill:  0    Please fill on or after 08/13/2024.  Fill every 30 days. Code: M54.50      Follow-up: Return in about 3 months (around 10/27/2024) for a follow-up visit.  Marolyn Noel, MD

## 2024-07-28 NOTE — Telephone Encounter (Signed)
 Received message from [place], [place] pt has been feeling fatigued, and fell asleep at stop light.   He has yearly sleep f/u scheduled 07/30/24.  Called pt to ask if he was having any other symptoms, he states no he has just been very fatigued and concerned about that. He asked if Dr. Francyne has any suggestions for him.

## 2024-07-28 NOTE — Assessment & Plan Note (Signed)
 On Protonix

## 2024-07-29 DIAGNOSIS — S91204A Unspecified open wound of right lesser toe(s) with damage to nail, initial encounter: Secondary | ICD-10-CM | POA: Diagnosis not present

## 2024-07-29 DIAGNOSIS — Z9889 Other specified postprocedural states: Secondary | ICD-10-CM | POA: Diagnosis not present

## 2024-07-30 ENCOUNTER — Ambulatory Visit: Admitting: Cardiology

## 2024-07-30 NOTE — Progress Notes (Deleted)
 SLEEP MEDICINE OFFICE VISIT NOTE:   Date:  07/30/2024   ID:  Cody Frazier, DOB 09/19/1946, MRN [id number] The patient was identified using 2 identifiers.  PCP:  Plotnikov, Karlynn GAILS, MD  Cardiologist:  Jerel Balding, MD    Referring MD: Garald Karlynn GAILS, MD   No chief complaint on file.   History of Present Illness:    Cody Frazier is a 78 y.o. male with a hx of atrial fibrillation, Barrett's esophagus, GERD with duodenal stricture and gastric outlet obstruction, hypertension and hyperlipidemia.  He is followed by Dr. Balding.  He was followed by Dr. Burnard in the past for sleep apnea and is now transition to my care upon his retirement.  He was initially diagnosed by home sleep study in 2020 showing severe obstructive sleep apnea with an AHI of 50.9/h and severe O2 desaturations as low as 75%.  Time spent with O2 sats less than 89% was 83 minutes.  He underwent CPAP titration on April 30, 2019 and was titrated to 13 cm H2O.  He uses a fullface mask.  He is now followed by [facility] for his DME company.  He is doing well with his PAP device and thinks that he has gotten used to it.  He tolerates the mask and feels the pressure is adequate.  Since going on PAP he feels rested in the am and has no significant daytime sleepiness.  He denies any significant mouth or nasal dryness or nasal congestion.  He does not think that he snores. An Epworth Sleepiness Scale score was calculated the office today and this endorsed at ### arguing against residual daytime sleepiness. Patient denies any episodes of bruxism, restless legs, No gagging hallucinations or cataplectic events.    Past Medical History:  Diagnosis Date   Allergic rhinitis    Anemia, iron  deficiency    Arthritis    Atrial arrhythmia    Atrial fib/flutter, transient (HCC)    following prior surgeries x 2   Barrett's esophagus without dysplasia    BPH (benign prostatic hyperplasia)    Bright's disease    Chronic  LBP    Closed fracture of right distal radius    DDD (degenerative disc disease)    Diverticulosis of colon    Duodenal stricture    Gastric outlet obstruction    GERD (gastroesophageal reflux disease)    Glaucoma    Hemorrhoids    History of kidney stones    Hyperlipidemia    Hyperplastic colonic polyp 01/2000   Hypertension    Nephrolithiasis    hx of B   Peptic ulcer disease with hemorrhage 08/2008   and [facility]. Pylori Ab negative   Renal cyst    Scoliosis    Sleep apnea with use of continuous positive airway pressure (CPAP)    Tubular adenoma of colon 03/2012   Wears glasses     Past Surgical History:  Procedure Laterality Date   APPENDECTOMY     BACK SURGERY  02/2003   L5   BILROTH I PROCEDURE  2011   Dr Sheldon   BIOPSY  11/21/2023   Procedure: BIOPSY;  Surgeon: Wilhelmenia Aloha Raddle., MD;  Location: [facility];  Service: Gastroenterology;;   CLOSED REDUCTION WRIST FRACTURE Right 01/06/2019   Procedure: Closed Reduction Wrist/Pinning Wrist;  Surgeon: Lorretta Dess, MD;  Location: [facility];  Service: Plastics;  Laterality: Right;   COLONOSCOPY     COLONOSCOPY WITH PROPOFOL  N/A 08/14/2020   Procedure:  COLONOSCOPY WITH PROPOFOL ;  Surgeon: Teressa Toribio SQUIBB, MD;  Location: [facility];  Service: Endoscopy;  Laterality: N/A;   ESOPHAGOGASTRODUODENOSCOPY N/A 11/21/2023   Procedure: ESOPHAGOGASTRODUODENOSCOPY (EGD);  Surgeon: Wilhelmenia Aloha Raddle., MD;  Location: [facility];  Service: Gastroenterology;  Laterality: N/A;   EUS N/A 11/21/2023   Procedure: UPPER ENDOSCOPIC ULTRASOUND (EUS) RADIAL;  Surgeon: Wilhelmenia Aloha Raddle., MD;  Location: [facility];  Service: Gastroenterology;  Laterality: N/A;   HIATAL HERNIA REPAIR     LAPAROTOMY N/A 05/10/2017   Procedure: EXPLORATORY LAPAROTOMY WITH ENTEROLYSIS;  Surgeon: Gladis Cough, MD;  Location: [facility];  Service: General;  Laterality: N/A;   LUMBAR FUSION  2009   SMALL INTESTINE SURGERY     TONSILLECTOMY     UPPER  GASTROINTESTINAL ENDOSCOPY  07/26/2020    Current Medications: No outpatient medications have been marked as taking for the 07/30/24 encounter (Appointment) with Shlomo Wilbert SAUNDERS, MD.     Allergies:   Codeine phosphate, Nsaids, and Adhesive [tape]   Social History   Socioeconomic History   Marital status: Married    Spouse name: Ulrich Soules   Number of children: 1   Years of education: Not on file   Highest education level: Not on file  Occupational History   Occupation: PASTOR    Employer: [employer]  Tobacco Use   Smoking status: Former    Passive exposure: Past   Smokeless tobacco: Never   Tobacco comments:    as a teenager  Vaping Use   Vaping status: Never Used  Substance and Sexual Activity   Alcohol use: No   Drug use: No   Sexual activity: Yes  Other Topics Concern   Not on file  Social History Narrative   Patient gets regular exercise   Married x 42 years   Social Drivers of Corporate investment banker Strain: Low Risk  (01/15/2024)   Overall Financial Resource Strain (CARDIA)    Difficulty of Paying Living Expenses: Not hard at all  Food Insecurity: No Food Insecurity (01/15/2024)   Hunger Vital Sign    Worried About Running Out of Food in the Last Year: Never true    Ran Out of Food in the Last Year: Never true  Transportation Needs: No Transportation Needs (01/15/2024)   PRAPARE - Administrator, Civil Service (Medical): No    Lack of Transportation (Non-Medical): No  Physical Activity: Insufficiently Active (01/15/2024)   Exercise Vital Sign    Days of Exercise per Week: 5 days    Minutes of Exercise per Session: 20 min  Stress: No Stress Concern Present (01/15/2024)   [employer] of Occupational Health - Occupational Stress Questionnaire    Feeling of Stress : Not at all  Social Connections: Moderately Isolated (01/15/2024)   Social Connection and Isolation Panel    Frequency of Communication with Friends and Family: More than  three times a week    Frequency of Social Gatherings with Friends and Family: More than three times a week    Attends Religious Services: Never    Database administrator or Organizations: No    Attends Engineer, structural: Never    Marital Status: Married     Family History: The patient's family history includes Colon cancer in his cousin; Colon cancer (age of onset: 73) in his paternal uncle and paternal uncle; Colon cancer (age of onset: 67) in his father; Colon cancer (age of onset: 32) in his mother; Colon polyps in his father  and mother; Drug abuse in his sister; Heart disease in his mother; Hypertension in an other family member; Prostate cancer in his father; Stroke in his father. There is no history of Stomach cancer, Esophageal cancer, Pancreatic cancer, or Liver disease.  ROS:   Please see the history of present illness.    ROS  All other systems reviewed and negative.   EKGs/Labs/Other Studies Reviewed:    The following studies were reviewed today: PAP compliance download   Physical Exam:    VS:  There were no vitals taken for this visit.    Wt Readings from Last 3 Encounters:  07/28/24 203 lb (92.1 kg)  06/15/24 202 lb (91.6 kg)  03/27/24 201 lb 6.4 oz (91.4 kg)      ASSESSMENT:    No diagnosis found. PLAN:    In order of problems listed above:  OSA - The patient is tolerating PAP therapy well without any problems. The PAP download performed by his DME was personally reviewed and interpreted by me today and showed an AHI of ***/hr on *** cm H2O with ***% compliance in using more than 4 hours nightly.  The patient has been using and benefiting from PAP use and will continue to benefit from therapy.   Hypertension - BP controlled on exam today - Continue Toprol  XL 25 mg daily, spironolactone  25 mg daily and telmisartan  20 mg daily with as needed refills  Medication Adjustments/Labs and Tests Ordered: Current medicines are reviewed at length with the  patient today.  Concerns regarding medicines are outlined above.  No orders of the defined types were placed in this encounter.  No orders of the defined types were placed in this encounter.   Signed, Wilbert Bihari, MD  07/30/2024 9:30 AM     [facility]
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
## 2024-08-10 ENCOUNTER — Encounter: Payer: Self-pay | Admitting: Cardiology

## 2024-08-10 ENCOUNTER — Ambulatory Visit: Attending: Cardiology | Admitting: Cardiology

## 2024-08-10 VITALS — BP 108/68 | HR 89 | Ht 65.0 in | Wt 200.8 lb

## 2024-08-10 DIAGNOSIS — G4733 Obstructive sleep apnea (adult) (pediatric): Secondary | ICD-10-CM | POA: Diagnosis not present

## 2024-08-10 DIAGNOSIS — I10 Essential (primary) hypertension: Secondary | ICD-10-CM

## 2024-08-10 NOTE — Patient Instructions (Signed)
 Medication Instructions:  Your physician recommends that you continue on your current medications as directed. Please refer to the Current Medication list given to you today.  *If you need a refill on your cardiac medications before your next appointment, please call your pharmacy*  Lab Work: None.  If you have labs (blood work) drawn today and your tests are completely normal, you will receive your results only by: MyChart Message (if you have MyChart) OR A paper copy in the mail If you have any lab test that is abnormal or we need to change your treatment, we will call you to review the results.  Testing/Procedures: None.  Follow-Up: At [facility], you and your health needs are our priority.  As part of our continuing mission to provide you with exceptional heart care, our providers are all part of one team.  This team includes your primary Cardiologist (physician) and Advanced Practice Providers or APPs (Physician Assistants and Nurse Practitioners) who all work together to provide you with the care you need, when you need it.  Your next appointment will be dependent on delivery of your cpap device:     Provider:   Dr. Wilbert Bihari, MD

## 2024-08-14 ENCOUNTER — Telehealth: Payer: Self-pay | Admitting: *Deleted

## 2024-08-14 DIAGNOSIS — I10 Essential (primary) hypertension: Secondary | ICD-10-CM

## 2024-08-14 DIAGNOSIS — G4733 Obstructive sleep apnea (adult) (pediatric): Secondary | ICD-10-CM

## 2024-08-14 DIAGNOSIS — I5032 Chronic diastolic (congestive) heart failure: Secondary | ICD-10-CM

## 2024-08-14 NOTE — Telephone Encounter (Signed)
-----   Message from Wilbert Bihari sent at 08/10/2024  2:01 PM EDT ----- order a new ResMed CPAP at 13cm H2O with heated humidity and mask of choice.

## 2024-08-14 NOTE — Telephone Encounter (Signed)
 Upon patient request DME selection is [facility] Patient understands he will be contacted by [facility] to set up his cpap. Patient understands to call if [facility] does not contact him with new setup in a timely manner. Patient understands they will be called once confirmation has been received from [facility] that they have received their new machine to schedule 10 week follow up appointment.   [facility] notified of new cpap order  Please add to airview Patient was grateful for the call and thanked me.   Per Dr Shlomo: Order placed to [facility] to order a new ResMed CPAP at 13cm H2O with heated humidity and mask of choice.

## 2024-08-17 DIAGNOSIS — H401211 Low-tension glaucoma, right eye, mild stage: Secondary | ICD-10-CM | POA: Diagnosis not present

## 2024-08-17 DIAGNOSIS — H2513 Age-related nuclear cataract, bilateral: Secondary | ICD-10-CM | POA: Diagnosis not present

## 2024-08-17 DIAGNOSIS — Z01818 Encounter for other preprocedural examination: Secondary | ICD-10-CM | POA: Diagnosis not present

## 2024-08-17 DIAGNOSIS — H2511 Age-related nuclear cataract, right eye: Secondary | ICD-10-CM | POA: Diagnosis not present

## 2024-08-25 DIAGNOSIS — G4733 Obstructive sleep apnea (adult) (pediatric): Secondary | ICD-10-CM | POA: Diagnosis not present

## 2024-08-31 DIAGNOSIS — R399 Unspecified symptoms and signs involving the genitourinary system: Secondary | ICD-10-CM | POA: Diagnosis not present

## 2024-08-31 DIAGNOSIS — Z87442 Personal history of urinary calculi: Secondary | ICD-10-CM | POA: Diagnosis not present

## 2024-08-31 DIAGNOSIS — N201 Calculus of ureter: Secondary | ICD-10-CM | POA: Diagnosis not present

## 2024-08-31 NOTE — Telephone Encounter (Signed)
 Pt calling to make Dr. Shlomo aware that he received CPAP machine on 10/17. He has it set up and it's working fine. Please advise

## 2024-09-05 DIAGNOSIS — E1136 Type 2 diabetes mellitus with diabetic cataract: Secondary | ICD-10-CM | POA: Diagnosis not present

## 2024-09-05 DIAGNOSIS — I130 Hypertensive heart and chronic kidney disease with heart failure and stage 1 through stage 4 chronic kidney disease, or unspecified chronic kidney disease: Secondary | ICD-10-CM | POA: Diagnosis not present

## 2024-09-05 DIAGNOSIS — E785 Hyperlipidemia, unspecified: Secondary | ICD-10-CM | POA: Diagnosis not present

## 2024-09-05 DIAGNOSIS — E1151 Type 2 diabetes mellitus with diabetic peripheral angiopathy without gangrene: Secondary | ICD-10-CM | POA: Diagnosis not present

## 2024-09-05 DIAGNOSIS — I509 Heart failure, unspecified: Secondary | ICD-10-CM | POA: Diagnosis not present

## 2024-09-05 DIAGNOSIS — I700 Atherosclerosis of aorta: Secondary | ICD-10-CM | POA: Diagnosis not present

## 2024-09-05 DIAGNOSIS — E1122 Type 2 diabetes mellitus with diabetic chronic kidney disease: Secondary | ICD-10-CM | POA: Diagnosis not present

## 2024-09-05 DIAGNOSIS — D6869 Other thrombophilia: Secondary | ICD-10-CM | POA: Diagnosis not present

## 2024-09-05 DIAGNOSIS — I4891 Unspecified atrial fibrillation: Secondary | ICD-10-CM | POA: Diagnosis not present

## 2024-09-08 ENCOUNTER — Other Ambulatory Visit: Payer: Self-pay | Admitting: Internal Medicine

## 2024-09-09 ENCOUNTER — Telehealth: Payer: Self-pay

## 2024-09-09 ENCOUNTER — Encounter: Payer: Self-pay | Admitting: Internal Medicine

## 2024-09-09 ENCOUNTER — Ambulatory Visit: Admitting: Internal Medicine

## 2024-09-09 VITALS — BP 132/78 | HR 76 | Temp 98.4°F | Ht 65.0 in | Wt 203.8 lb

## 2024-09-09 DIAGNOSIS — H6123 Impacted cerumen, bilateral: Secondary | ICD-10-CM

## 2024-09-09 DIAGNOSIS — N2889 Other specified disorders of kidney and ureter: Secondary | ICD-10-CM

## 2024-09-09 DIAGNOSIS — G8929 Other chronic pain: Secondary | ICD-10-CM | POA: Diagnosis not present

## 2024-09-09 DIAGNOSIS — M5442 Lumbago with sciatica, left side: Secondary | ICD-10-CM | POA: Diagnosis not present

## 2024-09-09 DIAGNOSIS — E669 Obesity, unspecified: Secondary | ICD-10-CM

## 2024-09-09 DIAGNOSIS — I10 Essential (primary) hypertension: Secondary | ICD-10-CM | POA: Diagnosis not present

## 2024-09-09 DIAGNOSIS — E119 Type 2 diabetes mellitus without complications: Secondary | ICD-10-CM | POA: Diagnosis not present

## 2024-09-09 DIAGNOSIS — M5441 Lumbago with sciatica, right side: Secondary | ICD-10-CM

## 2024-09-09 DIAGNOSIS — Z7985 Long-term (current) use of injectable non-insulin antidiabetic drugs: Secondary | ICD-10-CM | POA: Diagnosis not present

## 2024-09-09 MED ORDER — TIRZEPATIDE 2.5 MG/0.5ML ~~LOC~~ SOAJ: 2.5000 mg | SUBCUTANEOUS | 3 refills | Status: DC

## 2024-09-09 MED ORDER — OXYCODONE HCL 30 MG PO TABS: 30.0000 mg | ORAL_TABLET | Freq: Three times a day (TID) | ORAL | 0 refills | Status: DC | PRN

## 2024-09-09 MED ORDER — OXYCODONE HCL 30 MG PO TABS: 30.0000 mg | ORAL_TABLET | Freq: Three times a day (TID) | ORAL | 0 refills | Status: DC

## 2024-09-09 NOTE — Assessment & Plan Note (Signed)
Cont on Telmisartan, Lasix

## 2024-09-09 NOTE — Assessment & Plan Note (Signed)
 Continue with Ozempic. No change in dose - 1 mg/wk

## 2024-09-09 NOTE — Assessment & Plan Note (Signed)
Chronic LBP - 75% better on a new dose of Oxycodone Narcan to have at home Continue with weight loss Potential benefits of a long term opioids use as well as potential risks (i.e. addiction risk, apnea etc) and complications (i.e. Somnolence, constipation and others) were explained to the patient and were aknowledged.

## 2024-09-09 NOTE — Assessment & Plan Note (Signed)
Hydrate well ?Monitor GFR ?

## 2024-09-09 NOTE — Progress Notes (Signed)
 Subjective:  Patient ID: Cody Frazier, male    DOB: [birth year]  Age: 78 y.o. MRN: [id number]  CC: Medical Management of Chronic Issues (3 Month follow up)   HPI Cody Frazier presents for chronic pain, DM2, obesity C/o ear wax  Outpatient Medications Prior to Visit  Medication Sig Dispense Refill   acetaminophen  (TYLENOL ) 500 MG tablet Take 1,000 mg by mouth every 6 (six) hours as needed for mild pain or moderate pain. Maximum daily dose of 3g     apixaban  (ELIQUIS ) 2.5 MG TABS tablet Take 1 tablet (2.5 mg total) by mouth 2 (two) times daily. 180 tablet 3   atorvastatin  (LIPITOR) 20 MG tablet TAKE 1 TABLET BY MOUTH EVERY DAY 90 tablet 3   cetirizine (ZYRTEC) 10 MG tablet Take 10 mg by mouth daily.     Cholecalciferol (VITAMIN D3) 1.25 MG (50000 UT) CAPS TAKE 1 CAPSULE BY MOUTH MONTHLY 3 capsule 3   famotidine  (PEPCID ) 40 MG tablet TAKE 1 TABLET BY MOUTH EVERYDAY AT BEDTIME 30 tablet 11   fluticasone  (FLONASE ) 50 MCG/ACT nasal spray Place 2 sprays into both nostrils daily. 48 mL 3   furosemide  (LASIX ) 40 MG tablet Take 1 tablet (40 mg total) by mouth every other day. Take 40mg  every other day. May take an extra dose if your weight is over 212 pounds. 60 tablet 3   latanoprost  (XALATAN ) 0.005 % ophthalmic solution INSTILL 1 DROP BY OPHTHALMIC ROUTE AT BEDTIME INTO BOTH EYES 7.5 mL 1   metoprolol  succinate (TOPROL -XL) 25 MG 24 hr tablet Take 1 tablet (25 mg total) by mouth daily. 90 tablet 3   naloxone  (NARCAN ) 0.4 MG/ML injection Use prn if severe oversedation with oxycodone      ondansetron  (ZOFRAN ) 4 MG tablet Take 1 tablet (4 mg total) by mouth every 8 (eight) hours as needed for nausea or vomiting. 21 tablet 1   pantoprazole  (PROTONIX ) 40 MG tablet TAKE 1 TABLET BY MOUTH TWICE A DAY 60 tablet 11   Semaglutide , 1 MG/DOSE, 4 MG/3ML SOPN Inject 1 mg as directed once a week. 3 mL 5   spironolactone  (ALDACTONE ) 25 MG tablet TAKE 1 TABLET BY MOUTH EVERY DAY 90 tablet 3   sucralfate   (CARAFATE ) 1 g tablet TAKE 1 TABLET BY MOUTH 2 TIMES DAILY. 60 tablet 1   telmisartan  (MICARDIS ) 20 MG tablet TAKE 1 TABLET BY MOUTH EVERY DAY 90 tablet 3   timolol  (TIMOPTIC ) 0.25 % ophthalmic solution Place 1 drop into both eyes 2 (two) times daily.      triamcinolone  cream (KENALOG ) 0.1 % Apply 1 application topically 3 (three) times daily. 80 g 3   oxycodone  (ROXICODONE ) 30 MG immediate release tablet Take 1 tablet (30 mg total) by mouth every 8 (eight) hours as needed for pain. 90 tablet 0   hyoscyamine  (LEVSIN  SL) 0.125 MG SL tablet Place 1-2 tablets (0.125-0.25 mg total) under the tongue every 4 (four) hours as needed for cramping. (Patient not taking: Reported on 09/09/2024) 120 tablet 1   No facility-administered medications prior to visit.    ROS: Review of Systems  Constitutional:  Negative for appetite change, fatigue and unexpected weight change.  HENT:  Negative for congestion, nosebleeds, sneezing, sore throat and trouble swallowing.   Eyes:  Negative for itching and visual disturbance.  Respiratory:  Negative for cough.   Cardiovascular:  Negative for chest pain, palpitations and leg swelling.  Gastrointestinal:  Negative for abdominal distention, blood in stool, diarrhea and nausea.  Genitourinary:  Negative for frequency and hematuria.  Musculoskeletal:  Positive for arthralgias, back pain and gait problem. Negative for joint swelling and neck pain.  Skin:  Negative for rash.  Neurological:  Negative for dizziness, tremors, speech difficulty and weakness.  Psychiatric/Behavioral:  Negative for agitation, dysphoric mood, sleep disturbance and suicidal ideas. The patient is not nervous/anxious.     Objective:  BP 132/78   Pulse 76   Temp 98.4 F (36.9 C)   Ht 5' 5 (1.651 m)   Wt 203 lb 12.8 oz (92.4 kg)   SpO2 99%   BMI 33.91 kg/m   BP Readings from Last 3 Encounters:  09/09/24 132/78  08/10/24 108/68  07/28/24 (!) 134/90    Wt Readings from Last 3  Encounters:  09/09/24 203 lb 12.8 oz (92.4 kg)  08/10/24 200 lb 12.8 oz (91.1 kg)  07/28/24 203 lb (92.1 kg)    Physical Exam Constitutional:      General: He is not in acute distress.    Appearance: He is well-developed. He is obese.     Comments: NAD  Eyes:     Conjunctiva/sclera: Conjunctivae normal.     Pupils: Pupils are equal, round, and reactive to light.  Neck:     Thyroid : No thyromegaly.     Vascular: No JVD.  Cardiovascular:     Rate and Rhythm: Normal rate and regular rhythm.     Heart sounds: Normal heart sounds. No murmur heard.    No friction rub. No gallop.  Pulmonary:     Effort: Pulmonary effort is normal. No respiratory distress.     Breath sounds: Normal breath sounds. No wheezing or rales.  Chest:     Chest wall: No tenderness.  Abdominal:     General: Bowel sounds are normal. There is no distension.     Palpations: Abdomen is soft. There is no mass.     Tenderness: There is no abdominal tenderness. There is no guarding or rebound.  Musculoskeletal:        General: No tenderness. Normal range of motion.     Cervical back: Normal range of motion.     Right lower leg: No edema.     Left lower leg: No edema.  Lymphadenopathy:     Cervical: No cervical adenopathy.  Skin:    General: Skin is warm and dry.     Findings: No rash.  Neurological:     Mental Status: He is alert and oriented to person, place, and time.     Cranial Nerves: No cranial nerve deficit.     Motor: No abnormal muscle tone.     Coordination: Coordination normal.     Gait: Gait normal.     Deep Tendon Reflexes: Reflexes are normal and symmetric.  Psychiatric:        Behavior: Behavior normal.        Thought Content: Thought content normal.        Judgment: Judgment normal.    R ear wax   Procedure Note :     Procedure :  Ear irrigation right  ear   Indication:  Cerumen impaction right and left ears   Risks, including pain, dizziness, eardrum perforation, bleeding,  infection and others as well as benefits were explained to the patient in detail. Verbal consent was obtained and the patient agreed to proceed.    We used The Elephant Ear Irrigation Device filled with lukewarm water for irrigation. A large amount wax was recovered from  R ear  Procedure has also required manual wax removal/instrumentation with an ear wax curette and ear forceps on the right and left ears.   Tolerated well. Complications: None.   Postprocedure instructions :  Call if problems.  Lab Results  Component Value Date   WBC 6.9 07/03/2023   HGB 14.3 07/03/2023   HCT 44.0 07/03/2023   PLT 321.0 07/03/2023   GLUCOSE 106 (H) 06/15/2024   CHOL 196 11/20/2018   TRIG 269.0 (H) 11/20/2018   HDL 43.60 11/20/2018   LDLDIRECT 109.0 11/20/2018   LDLCALC 86 11/25/2017   ALT 11 06/15/2024   AST 14 06/15/2024   NA 139 06/15/2024   K 4.3 06/15/2024   CL 103 06/15/2024   CREATININE 1.01 06/15/2024   BUN 19 06/15/2024   CO2 27 06/15/2024   TSH 0.83 07/03/2023   PSA 0.16 11/20/2018   INR 1.1 08/12/2020   HGBA1C 5.6 06/15/2024   MICROALBUR <0.7 01/20/2024    No results found.  Assessment & Plan:   Problem List Items Addressed This Visit     Cerumen impaction   R>>L ear See procedure      CRI (chronic renal insufficiency), stage 2 (mild) - Primary   Hydrate well Monitor GFR      Essential hypertension   Cont on Telmisartan , Lasix       Low back pain   Chronic LBP - 75% better on a new dose of Oxycodone  Narcan  to have at home Continue with weight loss  Potential benefits of a long term opioids use as well as potential risks (i.e. addiction risk, apnea etc) and complications (i.e. Somnolence, constipation and others) were explained to the patient and were aknowledged.      Relevant Medications   oxycodone  (ROXICODONE ) 30 MG immediate release tablet   oxycodone  (ROXICODONE ) 30 MG immediate release tablet   Type 2 diabetes mellitus in patient with obesity (HCC)    Continue with Ozempic . No change in dose - 1 mg/wk      Relevant Medications   tirzepatide (MOUNJARO) 2.5 MG/0.5ML Pen      Meds ordered this encounter  Medications   oxycodone  (ROXICODONE ) 30 MG immediate release tablet    Sig: Take 1 tablet (30 mg total) by mouth every 8 (eight) hours as needed for pain.    Dispense:  90 tablet    Refill:  0    Please fill on or after 09/09/2024.  Fill every 30 days. Code: M54.50   oxycodone  (ROXICODONE ) 30 MG immediate release tablet    Sig: Take 1 tablet (30 mg total) by mouth in the morning, at noon, and at bedtime.    Dispense:  90 tablet    Refill:  0    Please fill on or after 10/09/2024.  Fill every 30 days. Code: M54.50   tirzepatide (MOUNJARO) 2.5 MG/0.5ML Pen    Sig: Inject 2.5 mg into the skin once a week.    Dispense:  2 mL    Refill:  3      Follow-up: Return in about 2 months (around 11/09/2024) for a follow-up visit.  Marolyn Noel, MD

## 2024-09-09 NOTE — Assessment & Plan Note (Signed)
 R>>L ear See procedure

## 2024-09-09 NOTE — Telephone Encounter (Signed)
 Pharmacy Patient Advocate Encounter   Received notification from CoverMyMeds that prior authorization for Mounjaro 2.5MG /0.5ML auto-injectors is required/requested.   Insurance verification completed.   The patient is insured through [employer].   Per test claim: PA required; PA submitted to above mentioned insurance via Latent Key/confirmation #/EOC [id number] Status is pending

## 2024-09-10 ENCOUNTER — Telehealth: Payer: Self-pay

## 2024-09-10 ENCOUNTER — Other Ambulatory Visit (HOSPITAL_COMMUNITY): Payer: Self-pay

## 2024-09-10 DIAGNOSIS — H40011 Open angle with borderline findings, low risk, right eye: Secondary | ICD-10-CM | POA: Diagnosis not present

## 2024-09-10 DIAGNOSIS — H401211 Low-tension glaucoma, right eye, mild stage: Secondary | ICD-10-CM | POA: Diagnosis not present

## 2024-09-10 DIAGNOSIS — E1136 Type 2 diabetes mellitus with diabetic cataract: Secondary | ICD-10-CM | POA: Diagnosis not present

## 2024-09-10 DIAGNOSIS — E1139 Type 2 diabetes mellitus with other diabetic ophthalmic complication: Secondary | ICD-10-CM | POA: Diagnosis not present

## 2024-09-10 DIAGNOSIS — H2511 Age-related nuclear cataract, right eye: Secondary | ICD-10-CM | POA: Diagnosis not present

## 2024-09-10 DIAGNOSIS — H25811 Combined forms of age-related cataract, right eye: Secondary | ICD-10-CM | POA: Diagnosis not present

## 2024-09-10 NOTE — Telephone Encounter (Signed)
 Pharmacy Patient Advocate Encounter   Received notification from Latent that prior authorization for Zepbound 2.5MG /0.5ML pen-injectors  is required/requested.   Insurance verification completed.   The patient is insured through [employer].   Per test claim: PA required; PA submitted to above mentioned insurance via Latent Key/confirmation #/EOC [id number] Status is pending

## 2024-09-10 NOTE — Telephone Encounter (Signed)
 OZEMPIC BRENA IS APPROVED EXCLUSIVELY AS AN ADJUNCT TO DIET AND EXERCISE TO IMPROVE GLYCEMIC CONTROL IN ADULTS WITH TYPE 2 DIABETES MELLITUS. A REVIEW OF PATIENT'S MEDICAL CHART REVEALS NO DOCUMENTED DIAGNOSIS OF TYPE 2 DIABETES OR AN A1C greater that 6.5 INDICATIVE OF DIABETES. Therefore, they do not currently meet the criteria for prior authorization of this medication. If clinically appropriate, alternative  options such as Saxenda, Zepbound, or Wegovy  may be considered for this patient.

## 2024-09-11 ENCOUNTER — Other Ambulatory Visit (HOSPITAL_COMMUNITY): Payer: Self-pay

## 2024-09-11 NOTE — Telephone Encounter (Signed)
 Pharmacy Patient Advocate Encounter  Received notification from [employer] that Prior Authorization for Zepbound 2.5MG /0.5ML pen-injectors   has been APPROVED from 09/11/2024 to 11/11/2025   PA #/Case ID/Reference #: [id number]

## 2024-09-14 ENCOUNTER — Ambulatory Visit: Admitting: Internal Medicine

## 2024-09-24 DIAGNOSIS — H25812 Combined forms of age-related cataract, left eye: Secondary | ICD-10-CM | POA: Diagnosis not present

## 2024-09-24 DIAGNOSIS — H2512 Age-related nuclear cataract, left eye: Secondary | ICD-10-CM | POA: Diagnosis not present

## 2024-09-24 DIAGNOSIS — H40012 Open angle with borderline findings, low risk, left eye: Secondary | ICD-10-CM | POA: Diagnosis not present

## 2024-10-13 ENCOUNTER — Other Ambulatory Visit: Payer: Self-pay

## 2024-10-13 DIAGNOSIS — K2270 Barrett's esophagus without dysplasia: Secondary | ICD-10-CM

## 2024-10-13 DIAGNOSIS — D508 Other iron deficiency anemias: Secondary | ICD-10-CM

## 2024-10-13 MED ORDER — PANTOPRAZOLE SODIUM 40 MG PO TBEC: 40.0000 mg | DELAYED_RELEASE_TABLET | Freq: Two times a day (BID) | ORAL | 5 refills | Status: AC

## 2024-10-13 NOTE — Progress Notes (Signed)
 Cody Frazier patient requesting refill of pantoprazole .  Mansouraty recently scoped and indicated patient should continue PPI.  Filled for 60 days

## 2024-10-24 ENCOUNTER — Other Ambulatory Visit: Payer: Self-pay | Admitting: Internal Medicine

## 2024-11-09 ENCOUNTER — Encounter: Payer: Self-pay | Admitting: Internal Medicine

## 2024-11-09 ENCOUNTER — Ambulatory Visit: Admitting: Internal Medicine

## 2024-11-09 VITALS — BP 138/70 | HR 89 | Temp 98.5°F | Ht 65.0 in | Wt 204.0 lb

## 2024-11-09 DIAGNOSIS — M5442 Lumbago with sciatica, left side: Secondary | ICD-10-CM | POA: Diagnosis not present

## 2024-11-09 DIAGNOSIS — Z7985 Long-term (current) use of injectable non-insulin antidiabetic drugs: Secondary | ICD-10-CM | POA: Diagnosis not present

## 2024-11-09 DIAGNOSIS — M5441 Lumbago with sciatica, right side: Secondary | ICD-10-CM | POA: Diagnosis not present

## 2024-11-09 DIAGNOSIS — N2889 Other specified disorders of kidney and ureter: Secondary | ICD-10-CM | POA: Diagnosis not present

## 2024-11-09 DIAGNOSIS — E669 Obesity, unspecified: Secondary | ICD-10-CM | POA: Diagnosis not present

## 2024-11-09 DIAGNOSIS — I480 Paroxysmal atrial fibrillation: Secondary | ICD-10-CM

## 2024-11-09 DIAGNOSIS — E119 Type 2 diabetes mellitus without complications: Secondary | ICD-10-CM

## 2024-11-09 DIAGNOSIS — I10 Essential (primary) hypertension: Secondary | ICD-10-CM

## 2024-11-09 DIAGNOSIS — F419 Anxiety disorder, unspecified: Secondary | ICD-10-CM

## 2024-11-09 DIAGNOSIS — Z6833 Body mass index (BMI) 33.0-33.9, adult: Secondary | ICD-10-CM

## 2024-11-09 DIAGNOSIS — G8929 Other chronic pain: Secondary | ICD-10-CM | POA: Diagnosis not present

## 2024-11-09 LAB — COMPREHENSIVE METABOLIC PANEL WITH GFR
ALT: 12 U/L (ref 3–53)
AST: 15 U/L (ref 5–37)
Albumin: 4.3 g/dL (ref 3.5–5.2)
Alkaline Phosphatase: 81 U/L (ref 39–117)
BUN: 18 mg/dL (ref 6–23)
CO2: 26 meq/L (ref 19–32)
Calcium: 9.8 mg/dL (ref 8.4–10.5)
Chloride: 105 meq/L (ref 96–112)
Creatinine, Ser: 1.03 mg/dL (ref 0.40–1.50)
GFR: 69.26 mL/min
Glucose, Bld: 107 mg/dL — ABNORMAL HIGH (ref 70–99)
Potassium: 4.1 meq/L (ref 3.5–5.1)
Sodium: 142 meq/L (ref 135–145)
Total Bilirubin: 0.6 mg/dL (ref 0.2–1.2)
Total Protein: 7.1 g/dL (ref 6.0–8.3)

## 2024-11-09 LAB — HEMOGLOBIN A1C: Hgb A1c MFr Bld: 5.3 % (ref 4.6–6.5)

## 2024-11-09 MED ORDER — OXYCODONE HCL 30 MG PO TABS: 30.0000 mg | ORAL_TABLET | Freq: Three times a day (TID) | ORAL | 0 refills | Status: AC

## 2024-11-09 MED ORDER — TIRZEPATIDE 5 MG/0.5ML ~~LOC~~ SOAJ: 5.0000 mg | SUBCUTANEOUS | 0 refills | Status: AC

## 2024-11-09 MED ORDER — OXYCODONE HCL 30 MG PO TABS: 30.0000 mg | ORAL_TABLET | Freq: Three times a day (TID) | ORAL | 0 refills | Status: AC | PRN

## 2024-11-09 MED ORDER — SACCHAROMYCES BOULARDII 250 MG PO CAPS: 250.0000 mg | ORAL_CAPSULE | Freq: Two times a day (BID) | ORAL | 1 refills | Status: AC

## 2024-11-09 NOTE — Progress Notes (Signed)
 "  Subjective:  Patient ID: Cody Frazier, male    DOB: [birth year]  Age: 78 y.o. MRN: [id number]  CC: Medical Management of Chronic Issues   HPI WHITTEN ANDREONI presents for chronic pain F/u on DM, CAD  Outpatient Medications Prior to Visit  Medication Sig Dispense Refill   acetaminophen  (TYLENOL ) 500 MG tablet Take 1,000 mg by mouth every 6 (six) hours as needed for mild pain or moderate pain. Maximum daily dose of 3g     apixaban  (ELIQUIS ) 2.5 MG TABS tablet Take 1 tablet (2.5 mg total) by mouth 2 (two) times daily. 180 tablet 3   atorvastatin  (LIPITOR) 20 MG tablet TAKE 1 TABLET BY MOUTH EVERY DAY 90 tablet 3   cetirizine (ZYRTEC) 10 MG tablet Take 10 mg by mouth daily.     Cholecalciferol (VITAMIN D3) 1.25 MG (50000 UT) CAPS TAKE 1 CAPSULE BY MOUTH MONTHLY 3 capsule 3   famotidine  (PEPCID ) 40 MG tablet TAKE 1 TABLET BY MOUTH EVERYDAY AT BEDTIME 30 tablet 11   fluticasone  (FLONASE ) 50 MCG/ACT nasal spray Place 2 sprays into both nostrils daily. 48 mL 3   furosemide  (LASIX ) 40 MG tablet Take 1 tablet (40 mg total) by mouth every other day. Take 40mg  every other day. May take an extra dose if your weight is over 212 pounds. 60 tablet 3   latanoprost  (XALATAN ) 0.005 % ophthalmic solution INSTILL 1 DROP BY OPHTHALMIC ROUTE AT BEDTIME INTO BOTH EYES 7.5 mL 1   metoprolol  succinate (TOPROL -XL) 25 MG 24 hr tablet Take 1 tablet (25 mg total) by mouth daily. 90 tablet 3   naloxone  (NARCAN ) 0.4 MG/ML injection Use prn if severe oversedation with oxycodone      ondansetron  (ZOFRAN ) 4 MG tablet Take 1 tablet (4 mg total) by mouth every 8 (eight) hours as needed for nausea or vomiting. 21 tablet 1   pantoprazole  (PROTONIX ) 40 MG tablet Take 1 tablet (40 mg total) by mouth 2 (two) times daily. 60 tablet 5   spironolactone  (ALDACTONE ) 25 MG tablet TAKE 1 TABLET BY MOUTH EVERY DAY 90 tablet 3   sucralfate  (CARAFATE ) 1 g tablet TAKE 1 TABLET BY MOUTH 2 TIMES DAILY. 60 tablet 1   telmisartan  (MICARDIS )  20 MG tablet TAKE 1 TABLET BY MOUTH EVERY DAY 90 tablet 3   timolol  (TIMOPTIC ) 0.25 % ophthalmic solution Place 1 drop into both eyes 2 (two) times daily.      triamcinolone  cream (KENALOG ) 0.1 % Apply 1 application topically 3 (three) times daily. 80 g 3   oxycodone  (ROXICODONE ) 30 MG immediate release tablet Take 1 tablet (30 mg total) by mouth every 8 (eight) hours as needed for pain. 90 tablet 0   oxycodone  (ROXICODONE ) 30 MG immediate release tablet Take 1 tablet (30 mg total) by mouth in the morning, at noon, and at bedtime. 90 tablet 0   Semaglutide , 1 MG/DOSE, 4 MG/3ML SOPN Inject 1 mg as directed once a week. 3 mL 5   tirzepatide  (MOUNJARO ) 2.5 MG/0.5ML Pen Inject 2.5 mg into the skin once a week. 2 mL 3   hyoscyamine  (LEVSIN  SL) 0.125 MG SL tablet Place 1-2 tablets (0.125-0.25 mg total) under the tongue every 4 (four) hours as needed for cramping. (Patient not taking: Reported on 11/09/2024) 120 tablet 1   No facility-administered medications prior to visit.    ROS: Review of Systems  Constitutional:  Positive for fatigue and unexpected weight change. Negative for appetite change.  HENT:  Negative for congestion, nosebleeds, sneezing,  sore throat and trouble swallowing.   Eyes:  Negative for itching and visual disturbance.  Respiratory:  Negative for cough.   Cardiovascular:  Negative for chest pain, palpitations and leg swelling.  Gastrointestinal:  Negative for abdominal distention, blood in stool, diarrhea and nausea.  Genitourinary:  Negative for frequency and hematuria.  Musculoskeletal:  Positive for arthralgias, back pain, gait problem and neck stiffness. Negative for joint swelling and neck pain.  Skin:  Negative for rash.  Neurological:  Negative for dizziness, tremors, speech difficulty and weakness.  Psychiatric/Behavioral:  Negative for agitation, dysphoric mood and sleep disturbance. The patient is not nervous/anxious.     Objective:  BP 138/70 (BP Location: Left  Arm, Patient Position: Sitting, Cuff Size: Large)   Pulse 89   Temp 98.5 F (36.9 C) (Oral)   Ht 5' 5 (1.651 m)   Wt 204 lb (92.5 kg)   SpO2 98%   BMI 33.95 kg/m   BP Readings from Last 3 Encounters:  11/09/24 138/70  09/09/24 132/78  08/10/24 108/68    Wt Readings from Last 3 Encounters:  11/09/24 204 lb (92.5 kg)  09/09/24 203 lb 12.8 oz (92.4 kg)  08/10/24 200 lb 12.8 oz (91.1 kg)    Physical Exam Constitutional:      General: He is not in acute distress.    Appearance: He is well-developed. He is obese.     Comments: NAD  Eyes:     Conjunctiva/sclera: Conjunctivae normal.     Pupils: Pupils are equal, round, and reactive to light.  Neck:     Thyroid : No thyromegaly.     Vascular: No JVD.  Cardiovascular:     Rate and Rhythm: Normal rate and regular rhythm.     Heart sounds: Normal heart sounds. No murmur heard.    No friction rub. No gallop.  Pulmonary:     Effort: Pulmonary effort is normal. No respiratory distress.     Breath sounds: Normal breath sounds. No wheezing or rales.  Chest:     Chest wall: No tenderness.  Abdominal:     General: Bowel sounds are normal. There is no distension.     Palpations: Abdomen is soft. There is no mass.     Tenderness: There is no abdominal tenderness. There is no guarding or rebound.  Musculoskeletal:        General: Tenderness present. Normal range of motion.     Cervical back: Normal range of motion.     Right lower leg: No edema.     Left lower leg: No edema.  Lymphadenopathy:     Cervical: No cervical adenopathy.  Skin:    General: Skin is warm and dry.     Findings: No rash.  Neurological:     Mental Status: He is alert and oriented to person, place, and time.     Cranial Nerves: No cranial nerve deficit.     Motor: No abnormal muscle tone.     Coordination: Coordination normal.     Gait: Gait normal.     Deep Tendon Reflexes: Reflexes are normal and symmetric.  Psychiatric:        Behavior: Behavior  normal.        Thought Content: Thought content normal.        Judgment: Judgment normal.   LS w/pauin Using a cane  Lab Results  Component Value Date   WBC 6.9 07/03/2023   HGB 14.3 07/03/2023   HCT 44.0 07/03/2023   PLT 321.0 07/03/2023  GLUCOSE 106 (H) 06/15/2024   CHOL 196 11/20/2018   TRIG 269.0 (H) 11/20/2018   HDL 43.60 11/20/2018   LDLDIRECT 109.0 11/20/2018   LDLCALC 86 11/25/2017   ALT 11 06/15/2024   AST 14 06/15/2024   NA 139 06/15/2024   K 4.3 06/15/2024   CL 103 06/15/2024   CREATININE 1.01 06/15/2024   BUN 19 06/15/2024   CO2 27 06/15/2024   TSH 0.83 07/03/2023   PSA 0.16 11/20/2018   INR 1.1 08/12/2020   HGBA1C 5.6 06/15/2024   MICROALBUR <0.7 01/20/2024    No results found.  Assessment & Plan:   Problem List Items Addressed This Visit     Anxiety disorder   The uncontrolled pain is aggravating.  On Buspar .  Treat pain      CRI (chronic renal insufficiency), stage 2 (mild)   Hydrate well Monitor GFR      Relevant Orders   Comprehensive metabolic panel with GFR   Hemoglobin A1c   Essential hypertension   Cont on Telmisartan , Lasix       Low back pain - Primary   Chronic LBP - 75% better on a new dose of Oxycodone  Narcan  to have at home Continue with weight loss  Potential benefits of a long term opioids use as well as potential risks (i.e. addiction risk, apnea etc) and complications (i.e. Somnolence, constipation and others) were explained to the patient and were aknowledged.      Relevant Medications   oxycodone  (ROXICODONE ) 30 MG immediate release tablet   oxycodone  (ROXICODONE ) 30 MG immediate release tablet   Paroxysmal atrial fibrillation (HCC)   Cont on anticoagulation - Eliquis       Type 2 diabetes mellitus in patient with obesity (HCC)   Continue with Ozempic . No change in dose - 1 mg/wk      Relevant Medications   tirzepatide  (MOUNJARO ) 5 MG/0.5ML Pen   Other Relevant Orders   Hemoglobin A1c      Meds ordered  this encounter  Medications   oxycodone  (ROXICODONE ) 30 MG immediate release tablet    Sig: Take 1 tablet (30 mg total) by mouth every 8 (eight) hours as needed for pain.    Dispense:  90 tablet    Refill:  0    Please fill on or after 12/09/2023.  Fill every 30 days. Code: M54.50   tirzepatide  (MOUNJARO ) 5 MG/0.5ML Pen    Sig: Inject 5 mg into the skin once a week.    Dispense:  6 mL    Refill:  0   oxycodone  (ROXICODONE ) 30 MG immediate release tablet    Sig: Take 1 tablet (30 mg total) by mouth in the morning, at noon, and at bedtime.    Dispense:  90 tablet    Refill:  0    Please fill on or after 11/08/2024.  Fill every 30 days. Code: M54.50   saccharomyces boulardii (FLORASTOR) 250 MG capsule    Sig: Take 1 capsule (250 mg total) by mouth 2 (two) times daily.    Dispense:  60 capsule    Refill:  1      Follow-up: Return in about 2 months (around 01/09/2025) for a follow-up visit.  Marolyn Noel, MD "

## 2024-11-09 NOTE — Assessment & Plan Note (Signed)
Cont on anticoagulation - Eliquis ?

## 2024-11-09 NOTE — Assessment & Plan Note (Signed)
Chronic LBP - 75% better on a new dose of Oxycodone Narcan to have at home Continue with weight loss Potential benefits of a long term opioids use as well as potential risks (i.e. addiction risk, apnea etc) and complications (i.e. Somnolence, constipation and others) were explained to the patient and were aknowledged.

## 2024-11-09 NOTE — Assessment & Plan Note (Signed)
Cont on Telmisartan, Lasix

## 2024-11-09 NOTE — Assessment & Plan Note (Signed)
Hydrate well ?Monitor GFR ?

## 2024-11-09 NOTE — Assessment & Plan Note (Signed)
The uncontrolled pain is aggravating.  On Buspar.  Treat pain

## 2024-11-09 NOTE — Assessment & Plan Note (Signed)
 Continue with Ozempic. No change in dose - 1 mg/wk

## 2024-11-11 ENCOUNTER — Telehealth: Payer: Self-pay

## 2024-11-11 NOTE — Telephone Encounter (Signed)
 Cody Frazier

## 2024-11-13 ENCOUNTER — Encounter: Payer: Self-pay | Admitting: Cardiology

## 2024-11-13 ENCOUNTER — Ambulatory Visit: Attending: Cardiology | Admitting: Cardiology

## 2024-11-13 ENCOUNTER — Ambulatory Visit: Payer: Self-pay | Admitting: Internal Medicine

## 2024-11-13 ENCOUNTER — Telehealth: Payer: Self-pay | Admitting: *Deleted

## 2024-11-13 VITALS — BP 130/80 | HR 88 | Ht 65.0 in | Wt 202.8 lb

## 2024-11-13 DIAGNOSIS — G4733 Obstructive sleep apnea (adult) (pediatric): Secondary | ICD-10-CM

## 2024-11-13 DIAGNOSIS — I10 Essential (primary) hypertension: Secondary | ICD-10-CM

## 2024-11-13 DIAGNOSIS — I5032 Chronic diastolic (congestive) heart failure: Secondary | ICD-10-CM

## 2024-11-13 NOTE — Patient Instructions (Signed)
 Medication Instructions:  Your physician recommends that you continue on your current medications as directed. Please refer to the Current Medication list given to you today.  *If you need a refill on your cardiac medications before your next appointment, please call your pharmacy*  Lab Work: NONE If you have labs (blood work) drawn today and your tests are completely normal, you will receive your results only by: MyChart Message (if you have MyChart) OR A paper copy in the mail If you have any lab test that is abnormal or we need to change your treatment, we will call you to review the results.  Testing/Procedures: NONE  Follow-Up: At [facility], you and your health needs are our priority.  As part of our continuing mission to provide you with exceptional heart care, our providers are all part of one team.  This team includes your primary Cardiologist (physician) and Advanced Practice Providers or APPs (Physician Assistants and Nurse Practitioners) who all work together to provide you with the care you need, when you need it.  Your next appointment:   1 year(s)  Provider:   Wilbert Bihari, MD

## 2024-11-13 NOTE — Progress Notes (Signed)
 "     SLEEP MEDICINE OFFICE VISIT NOTE:   Date:  11/13/2024   ID:  Cody Frazier, DOB February 16, 1946, MRN [id number]  PCP:  Garald Karlynn GAILS, MD  Cardiologist:  Jerel Balding, MD    Referring MD: Garald Karlynn GAILS, MD   Chief Complaint  Patient presents with   Sleep Apnea   Hypertension    History of Present Illness:    Cody Frazier is a 79 y.o. male with a hx of atrial fibrillation, Barrett's esophagus, GERD with duodenal stricture and gastric outlet obstruction, hypertension and hyperlipidemia.  He is followed by Dr. Balding.    He was initially diagnosed by home sleep study in 2020 showing severe obstructive sleep apnea with an AHI of 50.9/h and severe O2 desaturations as low as 75%.  Time spent with O2 sats less than 89% was 83 minutes.  He underwent CPAP titration on April 30, 2019 and was titrated to 13 cm H2O.  He uses a fullface mask.  He is now followed by [facility] for his DME company.   At last office visit 08/01/2024 his device was over 44 years old and he wanted to get a new one.  I ordered a ResMed AirSense 11 CPAP at 13 cm H2O.  He is now back for follow-up to document compliance per insurance requirements.  He is doing well with his PAP device and thinks that he has gotten used to it.  He tolerates the full face mask and feels the pressure is adequate.  Sometimes he feels rested in the am but has severe pain in his spine and does not sleep well at night due to the pain.  He gets sleepy and naps during the day.   He denies any significant nasal dryness or nasal congestion.  He has had some problems with dry mouth.  He does not think that he snores. An Epworth Sleepiness Scale score was calculated the office today and this endorsed at 13 arguing for some residual daytime sleepiness. Patient denies any episodes of bruxism, restless legs, No hypnogognic hallucinations or cataplectic events.    Past Medical History:  Diagnosis Date   Allergic rhinitis    Anemia, iron   deficiency    Arthritis    Atrial arrhythmia    Atrial fib/flutter, transient (HCC)    following prior surgeries x 2   Barrett's esophagus without dysplasia    BPH (benign prostatic hyperplasia)    Bright's disease    Chronic LBP    Closed fracture of right distal radius    DDD (degenerative disc disease)    Diverticulosis of colon    Duodenal stricture    Gastric outlet obstruction    GERD (gastroesophageal reflux disease)    Glaucoma    Hemorrhoids    History of kidney stones    Hyperlipidemia    Hyperplastic colonic polyp 01/2000   Hypertension    Nephrolithiasis    hx of B   Peptic ulcer disease with hemorrhage 08/2008   and [facility]. Pylori Ab negative   Renal cyst    Scoliosis    Sleep apnea with use of continuous positive airway pressure (CPAP)    Tubular adenoma of colon 03/2012   Wears glasses     Past Surgical History:  Procedure Laterality Date   APPENDECTOMY     BACK SURGERY  02/2003   L5   BILROTH I PROCEDURE  2011   Dr Sheldon   BIOPSY  11/21/2023   Procedure: BIOPSY;  Surgeon: Wilhelmenia Aloha Raddle., MD;  Location: [facility];  Service: Gastroenterology;;   CLOSED REDUCTION WRIST FRACTURE Right 01/06/2019   Procedure: Closed Reduction Wrist/Pinning Wrist;  Surgeon: Lorretta Dess, MD;  Location: [facility];  Service: Plastics;  Laterality: Right;   COLONOSCOPY     COLONOSCOPY WITH PROPOFOL  N/A 08/14/2020   Procedure: COLONOSCOPY WITH PROPOFOL ;  Surgeon: Teressa Toribio SQUIBB, MD;  Location: [facility];  Service: Endoscopy;  Laterality: N/A;   ESOPHAGOGASTRODUODENOSCOPY N/A 11/21/2023   Procedure: ESOPHAGOGASTRODUODENOSCOPY (EGD);  Surgeon: Wilhelmenia Aloha Raddle., MD;  Location: [facility];  Service: Gastroenterology;  Laterality: N/A;   EUS N/A 11/21/2023   Procedure: UPPER ENDOSCOPIC ULTRASOUND (EUS) RADIAL;  Surgeon: Wilhelmenia Aloha Raddle., MD;  Location: [facility];  Service: Gastroenterology;  Laterality: N/A;   HIATAL HERNIA REPAIR     LAPAROTOMY N/A  05/10/2017   Procedure: EXPLORATORY LAPAROTOMY WITH ENTEROLYSIS;  Surgeon: Gladis Cough, MD;  Location: [facility];  Service: General;  Laterality: N/A;   LUMBAR FUSION  2009   SMALL INTESTINE SURGERY     TONSILLECTOMY     UPPER GASTROINTESTINAL ENDOSCOPY  07/26/2020    Current Medications: Current Meds  Medication Sig   acetaminophen  (TYLENOL ) 500 MG tablet Take 1,000 mg by mouth every 6 (six) hours as needed for mild pain or moderate pain. Maximum daily dose of 3g   apixaban  (ELIQUIS ) 2.5 MG TABS tablet Take 1 tablet (2.5 mg total) by mouth 2 (two) times daily.   atorvastatin  (LIPITOR) 20 MG tablet TAKE 1 TABLET BY MOUTH EVERY DAY   cetirizine (ZYRTEC) 10 MG tablet Take 10 mg by mouth daily.   Cholecalciferol (VITAMIN D3) 1.25 MG (50000 UT) CAPS TAKE 1 CAPSULE BY MOUTH MONTHLY   famotidine  (PEPCID ) 40 MG tablet TAKE 1 TABLET BY MOUTH EVERYDAY AT BEDTIME   fluticasone  (FLONASE ) 50 MCG/ACT nasal spray Place 2 sprays into both nostrils daily.   furosemide  (LASIX ) 40 MG tablet Take 1 tablet (40 mg total) by mouth every other day. Take 40mg  every other day. May take an extra dose if your weight is over 212 pounds.   metoprolol  succinate (TOPROL -XL) 25 MG 24 hr tablet Take 1 tablet (25 mg total) by mouth daily.   naloxone  (NARCAN ) 0.4 MG/ML injection Use prn if severe oversedation with oxycodone    ondansetron  (ZOFRAN ) 4 MG tablet Take 1 tablet (4 mg total) by mouth every 8 (eight) hours as needed for nausea or vomiting.   oxycodone  (ROXICODONE ) 30 MG immediate release tablet Take 1 tablet (30 mg total) by mouth in the morning, at noon, and at bedtime. (Patient taking differently: Take 30 mg by mouth every 8 (eight) hours as needed for pain (by mouth in the morning ,noon,and bedtime).)   pantoprazole  (PROTONIX ) 40 MG tablet Take 1 tablet (40 mg total) by mouth 2 (two) times daily.   saccharomyces boulardii (FLORASTOR) 250 MG capsule Take 1 capsule (250 mg total) by mouth 2 (two) times daily.    spironolactone  (ALDACTONE ) 25 MG tablet TAKE 1 TABLET BY MOUTH EVERY DAY   sucralfate  (CARAFATE ) 1 g tablet TAKE 1 TABLET BY MOUTH 2 TIMES DAILY.   telmisartan  (MICARDIS ) 20 MG tablet TAKE 1 TABLET BY MOUTH EVERY DAY   tirzepatide  (MOUNJARO ) 5 MG/0.5ML Pen Inject 5 mg into the skin once a week.   triamcinolone  cream (KENALOG ) 0.1 % Apply 1 application topically 3 (three) times daily.     Allergies:   Codeine phosphate, Nsaids, and Adhesive [tape]   Social History   Socioeconomic History  Marital status: Married    Spouse name: Bertrand Vowels   Number of children: 1   Years of education: Not on file   Highest education level: Not on file  Occupational History   Occupation: PASTOR    Employer: [employer]  Tobacco Use   Smoking status: Former    Passive exposure: Past   Smokeless tobacco: Never   Tobacco comments:    as a teenager  Vaping Use   Vaping status: Never Used  Substance and Sexual Activity   Alcohol use: No   Drug use: No   Sexual activity: Yes  Other Topics Concern   Not on file  Social History Narrative   Patient gets regular exercise   Married x 42 years   Social Drivers of Health   Tobacco Use: Medium Risk (11/13/2024)   Patient History    Smoking Tobacco Use: Former    Smokeless Tobacco Use: Never    Passive Exposure: Past  Physicist, Medical Strain: Low Risk (01/15/2024)   Overall Financial Resource Strain (CARDIA)    Difficulty of Paying Living Expenses: Not hard at all  Food Insecurity: No Food Insecurity (01/15/2024)   Hunger Vital Sign    Worried About Running Out of Food in the Last Year: Never true    Ran Out of Food in the Last Year: Never true  Transportation Needs: No Transportation Needs (01/15/2024)   PRAPARE - Administrator, Civil Service (Medical): No    Lack of Transportation (Non-Medical): No  Physical Activity: Insufficiently Active (01/15/2024)   Exercise Vital Sign    Days of Exercise per Week: 5 days    Minutes  of Exercise per Session: 20 min  Stress: No Stress Concern Present (01/15/2024)   [employer] of Occupational Health - Occupational Stress Questionnaire    Feeling of Stress : Not at all  Social Connections: Moderately Isolated (01/15/2024)   Social Connection and Isolation Panel    Frequency of Communication with Friends and Family: More than three times a week    Frequency of Social Gatherings with Friends and Family: More than three times a week    Attends Religious Services: Never    Database Administrator or Organizations: No    Attends Banker Meetings: Never    Marital Status: Married  Depression (PHQ2-9): Low Risk (06/15/2024)   Depression (PHQ2-9)    PHQ-2 Score: 0  Alcohol Screen: Low Risk (01/15/2024)   Alcohol Screen    Last Alcohol Screening Score (AUDIT): 0  Housing: Unknown (01/15/2024)   Housing Stability Vital Sign    Unable to Pay for Housing in the Last Year: No    Number of Times Moved in the Last Year: Not on file    Homeless in the Last Year: No  Utilities: Not At Risk (01/15/2024)   AHC Utilities    Threatened with loss of utilities: No  Health Literacy: Adequate Health Literacy (01/15/2024)   B1300 Health Literacy    Frequency of need for help with medical instructions: Never     Family History: The patient's family history includes Colon cancer in his cousin; Colon cancer (age of onset: 3) in his paternal uncle and paternal uncle; Colon cancer (age of onset: 101) in his father; Colon cancer (age of onset: 23) in his mother; Colon polyps in his father and mother; Drug abuse in his sister; Heart disease in his mother; Hypertension in an other family member; Prostate cancer in his father; Stroke in his father.  There is no history of Stomach cancer, Esophageal cancer, Pancreatic cancer, or Liver disease.  ROS:   Please see the history of present illness.    ROS  All other systems reviewed and negative.   EKGs/Labs/Other Studies Reviewed:    The  following studies were reviewed today: PAP compliance download   Physical Exam:    VS:  BP 130/80   Pulse 88   Ht 5' 5 (1.651 m)   Wt 202 lb 12.8 oz (92 kg)   SpO2 96%   BMI 33.75 kg/m     Wt Readings from Last 3 Encounters:  11/13/24 202 lb 12.8 oz (92 kg)  11/09/24 204 lb (92.5 kg)  09/09/24 203 lb 12.8 oz (92.4 kg)    GEN: Well nourished, well developed in no acute distress HEENT: Normal NECK: No JVD; No carotid bruits LYMPHATICS: No lymphadenopathy CARDIAC:RRR, no murmurs, rubs, gallops RESPIRATORY:  Clear to auscultation without rales, wheezing or rhonchi  ABDOMEN: Soft, non-tender, non-distended MUSCULOSKELETAL:  No edema; No deformity  SKIN: Warm and dry NEUROLOGIC:  Alert and oriented x 3 PSYCHIATRIC:  Normal affect  ASSESSMENT:    1. OSA (obstructive sleep apnea)   2. Essential hypertension     PLAN:    In order of problems listed above:  OSA - The patient is tolerating PAP therapy well without any problems. The PAP download performed by his DME was personally reviewed and interpreted by me today and showed an AHI of 2.9 /hr on 13 cm H2O with 93 % compliance in using more than 4 hours nightly.  The patient has been using and benefiting from PAP use and will continue to benefit from therapy.  -he feels at times he needs more air although AHI is ok -will check an ONO on CPAP to make sure he has no residual nocturnal hypoxemia -encouraged him to adjust the humidity in his device for his mouth dryness  Hypertension - BP controlled on exam today - Continue Toprol  XL 25 mg daily, spironolactone  25 mg daily, Micardis  20 mg daily.  Refills - I have personally reviewed and interpreted outside labs performed by patient's PCP which showed serum creatinine 1.01 potassium 4.3 on 06/15/2024  FOllowup with me in 1 year  Medication Adjustments/Labs and Tests Ordered: Current medicines are reviewed at length with the patient today.  Concerns regarding medicines are  outlined above.  No orders of the defined types were placed in this encounter.  No orders of the defined types were placed in this encounter.   Signed, Wilbert Bihari, MD  11/13/2024 10:50 AM    [facility] "

## 2024-11-13 NOTE — Telephone Encounter (Signed)
-----   Message from Wilbert Bihari, MD sent at 11/13/2024 10:54 AM EST ----- Check ONO on CPAP

## 2024-11-13 NOTE — Telephone Encounter (Signed)
 Check ONO on CPAP  Order placed to [facility] via community message

## 2024-11-18 ENCOUNTER — Ambulatory Visit: Admitting: Internal Medicine

## 2024-11-19 ENCOUNTER — Ambulatory Visit: Admitting: Cardiology

## 2024-11-19 ENCOUNTER — Telehealth: Payer: Self-pay

## 2024-11-19 NOTE — Telephone Encounter (Signed)
 I returned a call to patient regarding growths that patient said Dr. Harley and him discussed potentially removing. Dr. Garald, are you okay if patient gets place on schedule tomorrow 11/20/2024? If yes can someone reach out to patient to get him scheduled. I informed patient someone will call him to let him know if he can be scheduled for this.

## 2024-11-19 NOTE — Telephone Encounter (Signed)
/  Spoke to patient and let him know I will message Dr. Plotnikov and see if he will put patient on schedule for Friday 11/20/2024

## 2024-11-19 NOTE — Telephone Encounter (Signed)
 Copied from CRM [id number]. Topic: Appointments - Scheduling Inquiry for Clinic >> Nov 19, 2024  3:17 PM Brittany M wrote: Reason for CRM: patient said he was told he could get an appointment on a Friday with Dr Garald- please contact patient

## 2024-11-20 ENCOUNTER — Telehealth: Payer: Self-pay

## 2024-11-20 ENCOUNTER — Ambulatory Visit: Admitting: Internal Medicine

## 2024-11-20 ENCOUNTER — Other Ambulatory Visit (HOSPITAL_COMMUNITY)
Admission: RE | Admit: 2024-11-20 | Discharge: 2024-11-20 | Disposition: A | Source: Ambulatory Visit | Attending: Internal Medicine | Admitting: Internal Medicine

## 2024-11-20 DIAGNOSIS — D485 Neoplasm of uncertain behavior of skin: Secondary | ICD-10-CM

## 2024-11-20 DIAGNOSIS — Z85828 Personal history of other malignant neoplasm of skin: Secondary | ICD-10-CM

## 2024-11-20 DIAGNOSIS — L821 Other seborrheic keratosis: Secondary | ICD-10-CM

## 2024-11-20 NOTE — Progress Notes (Signed)
 "  Subjective:  Patient ID: Cody Frazier, male    DOB: [birth year]  Age: 79 y.o. MRN: [id number]  CC: No chief complaint on file.   HPI Cody Frazier presents for skin bx  Outpatient Medications Prior to Visit  Medication Sig Dispense Refill   acetaminophen  (TYLENOL ) 500 MG tablet Take 1,000 mg by mouth every 6 (six) hours as needed for mild pain or moderate pain. Maximum daily dose of 3g     apixaban  (ELIQUIS ) 2.5 MG TABS tablet Take 1 tablet (2.5 mg total) by mouth 2 (two) times daily. 180 tablet 3   atorvastatin  (LIPITOR) 20 MG tablet TAKE 1 TABLET BY MOUTH EVERY DAY 90 tablet 3   cetirizine (ZYRTEC) 10 MG tablet Take 10 mg by mouth daily.     Cholecalciferol (VITAMIN D3) 1.25 MG (50000 UT) CAPS TAKE 1 CAPSULE BY MOUTH MONTHLY 3 capsule 3   fluticasone  (FLONASE ) 50 MCG/ACT nasal spray Place 2 sprays into both nostrils daily. 48 mL 3   furosemide  (LASIX ) 40 MG tablet Take 1 tablet (40 mg total) by mouth every other day. Take 40mg  every other day. May take an extra dose if your weight is over 212 pounds. 60 tablet 3   metoprolol  succinate (TOPROL -XL) 25 MG 24 hr tablet Take 1 tablet (25 mg total) by mouth daily. 90 tablet 3   naloxone  (NARCAN ) 0.4 MG/ML injection Use prn if severe oversedation with oxycodone      ondansetron  (ZOFRAN ) 4 MG tablet Take 1 tablet (4 mg total) by mouth every 8 (eight) hours as needed for nausea or vomiting. 21 tablet 1   oxycodone  (ROXICODONE ) 30 MG immediate release tablet Take 1 tablet (30 mg total) by mouth every 8 (eight) hours as needed for pain. (Patient not taking: Reported on 11/13/2024) 90 tablet 0   oxycodone  (ROXICODONE ) 30 MG immediate release tablet Take 1 tablet (30 mg total) by mouth in the morning, at noon, and at bedtime. (Patient taking differently: Take 30 mg by mouth every 8 (eight) hours as needed for pain (by mouth in the morning ,noon,and bedtime).) 90 tablet 0   pantoprazole  (PROTONIX ) 40 MG tablet Take 1 tablet (40 mg total) by mouth 2  (two) times daily. 60 tablet 5   saccharomyces boulardii (FLORASTOR) 250 MG capsule Take 1 capsule (250 mg total) by mouth 2 (two) times daily. 60 capsule 1   sucralfate  (CARAFATE ) 1 g tablet TAKE 1 TABLET BY MOUTH 2 TIMES DAILY. 60 tablet 1   telmisartan  (MICARDIS ) 20 MG tablet TAKE 1 TABLET BY MOUTH EVERY DAY 90 tablet 3   tirzepatide  (MOUNJARO ) 5 MG/0.5ML Pen Inject 5 mg into the skin once a week. 6 mL 0   triamcinolone  cream (KENALOG ) 0.1 % Apply 1 application topically 3 (three) times daily. 80 g 3   famotidine  (PEPCID ) 40 MG tablet TAKE 1 TABLET BY MOUTH EVERYDAY AT BEDTIME 30 tablet 11   spironolactone  (ALDACTONE ) 25 MG tablet TAKE 1 TABLET BY MOUTH EVERY DAY 90 tablet 3   No facility-administered medications prior to visit.    ROS: Review of Systems  Objective:  There were no vitals taken for this visit.  BP Readings from Last 3 Encounters:  11/13/24 130/80  11/09/24 138/70  09/09/24 132/78    Wt Readings from Last 3 Encounters:  11/13/24 202 lb 12.8 oz (92 kg)  11/09/24 204 lb (92.5 kg)  09/09/24 203 lb 12.8 oz (92.4 kg)    Physical Exam   Procedure Note :  Procedure :  Skin biopsy   Indication:  Changing mole (s ),  Suspicious lesion(s)   Risks including unsuccessful procedure , bleeding, infection, bruising, scar, a need for another complete procedure and others were explained to the patient in detail as well as the benefits. Informed consent was obtained verbally.  The patient was placed in a decubitus position.  Lesion #1 on the left temple   measuring approximately 3-1/2 x 2-1/2 cm, irregular shape and color   Skin over lesion #1  was prepped with Betadine and alcohol  and anesthetized with 1.5 cc of 2% lidocaine  and epinephrine , using a 25-gauge 1 inch needle.  Shave biopsy with a sterile Dermablade was carried out in the usual fashion.  I had to remove the lesion fragments.  Hyfrecator was used to destroy the rest of the lesion potentially left behind  and for hemostasis. Band-Aid was applied with antibiotic ointment.      Tolerated well. Complications none.      Postprocedure instructions :    A Band-Aid should be  changed once or twice daily. You can take a shower tomorrow.  Keep the wounds clean. You can wash them with liquid soap and water. Pat dry with gauze or a Kleenex tissue before applying antibiotic ointment and a Band-Aid.   You need to report immediately  if fever, chills or any signs of infection develop.    The biopsy results should be available in 1 -2 weeks.  Lab Results  Component Value Date   WBC 6.9 07/03/2023   HGB 14.3 07/03/2023   HCT 44.0 07/03/2023   PLT 321.0 07/03/2023   GLUCOSE 107 (H) 11/09/2024   CHOL 196 11/20/2018   TRIG 269.0 (H) 11/20/2018   HDL 43.60 11/20/2018   LDLDIRECT 109.0 11/20/2018   LDLCALC 86 11/25/2017   ALT 12 11/09/2024   AST 15 11/09/2024   NA 142 11/09/2024   K 4.1 11/09/2024   CL 105 11/09/2024   CREATININE 1.03 11/09/2024   BUN 18 11/09/2024   CO2 26 11/09/2024   TSH 0.83 07/03/2023   PSA 0.16 11/20/2018   INR 1.1 08/12/2020   HGBA1C 5.3 11/09/2024   MICROALBUR <0.7 01/20/2024    No results found.  Assessment & Plan:   Problem List Items Addressed This Visit     Neoplasm of uncertain behavior of skin - Primary   Left temple.  See procedure      Other Visit Diagnoses       History of malignant neoplasm of skin of unknown type       Relevant Orders   Surgical pathology (Completed)         No orders of the defined types were placed in this encounter.     Follow-up: No follow-ups on file.  Marolyn Noel, MD "

## 2024-11-20 NOTE — Telephone Encounter (Signed)
-----   Message from Nurse Odetta SQUIBB, RN sent at 11/21/2023  1:39 PM EST -----  MRI/MRCP in 1 year.

## 2024-11-20 NOTE — Telephone Encounter (Signed)
 Pt was seen today and was able to have his procedure done.

## 2024-11-20 NOTE — Telephone Encounter (Signed)
 He can come on Friday, 11/20/24 around 9 AM.  Thank you

## 2024-11-20 NOTE — Telephone Encounter (Signed)
 Pt needs appt for follow up since Dr Aneita has retired.  The pt has been advised and will call back to make appt

## 2024-11-20 NOTE — Patient Instructions (Signed)
Postprocedure instructions :    A Band-Aid should be  changed once or twice daily. You can take a shower tomorrow.  Keep the wounds clean. You can wash them with liquid soap and water. Pat dry with gauze or a Kleenex tissue before applying antibiotic ointment and a Band-Aid.   You need to report immediately  if fever, chills or any signs of infection develop.    The biopsy results should be available in 1 -2 weeks.   

## 2024-11-24 LAB — SURGICAL PATHOLOGY

## 2024-11-27 ENCOUNTER — Other Ambulatory Visit: Payer: Self-pay | Admitting: Gastroenterology

## 2024-11-29 ENCOUNTER — Ambulatory Visit: Payer: Self-pay | Admitting: Internal Medicine

## 2024-12-01 ENCOUNTER — Other Ambulatory Visit: Payer: Self-pay | Admitting: Cardiovascular Disease

## 2024-12-04 MED ORDER — SPIRONOLACTONE 25 MG PO TABS: 25.0000 mg | ORAL_TABLET | Freq: Every day | ORAL | 3 refills | Status: AC

## 2024-12-04 NOTE — Telephone Encounter (Signed)
 No evidence of nocturnal hypoxemia on CPAP - no O2 needed. Patient has been notified via his mychart.

## 2024-12-05 ENCOUNTER — Other Ambulatory Visit: Payer: Self-pay | Admitting: Internal Medicine

## 2024-12-06 ENCOUNTER — Encounter: Payer: Self-pay | Admitting: Internal Medicine

## 2024-12-06 NOTE — Assessment & Plan Note (Signed)
 Left temple.  See procedure

## 2024-12-07 NOTE — Progress Notes (Unsigned)
 Cody Frazier

## 2024-12-08 ENCOUNTER — Encounter: Payer: Self-pay | Admitting: Pediatrics

## 2024-12-08 ENCOUNTER — Ambulatory Visit: Admitting: Gastroenterology

## 2024-12-11 ENCOUNTER — Telehealth: Payer: Self-pay | Admitting: Internal Medicine

## 2024-12-11 NOTE — Telephone Encounter (Unsigned)
 Copied from CRM #[id number]. Topic: Clinical - Refused Triage >> Dec 11, 2024  4:19 PM Antwanette L wrote: The patient called to schedule a physical. He is experiencing ongoing spine pain and does not need to speak with a nurse at this time

## 2025-01-05 ENCOUNTER — Ambulatory Visit: Admitting: Pediatrics

## 2025-01-07 ENCOUNTER — Ambulatory Visit: Admitting: Internal Medicine

## 2025-01-21 ENCOUNTER — Encounter: Admitting: Internal Medicine

## 2025-01-21 ENCOUNTER — Ambulatory Visit

## 2025-01-25 ENCOUNTER — Encounter: Admitting: Internal Medicine
